# Patient Record
Sex: Female | Born: 1970 | Race: Black or African American | Hispanic: No | Marital: Single | Smoking: Current every day smoker
Health system: Southern US, Community
[De-identification: ages and names within clinical notes are randomized; demographics above are authoritative.]

## PROBLEM LIST (undated history)

## (undated) DIAGNOSIS — I509 Heart failure, unspecified: Secondary | ICD-10-CM

## (undated) DIAGNOSIS — R519 Headache, unspecified: Secondary | ICD-10-CM

## (undated) DIAGNOSIS — IMO0002 Reserved for concepts with insufficient information to code with codable children: Secondary | ICD-10-CM

## (undated) DIAGNOSIS — I4892 Unspecified atrial flutter: Secondary | ICD-10-CM

## (undated) DIAGNOSIS — D649 Anemia, unspecified: Secondary | ICD-10-CM

## (undated) DIAGNOSIS — I517 Cardiomegaly: Secondary | ICD-10-CM

## (undated) DIAGNOSIS — N179 Acute kidney failure, unspecified: Secondary | ICD-10-CM

## (undated) DIAGNOSIS — R51 Headache: Secondary | ICD-10-CM

## (undated) DIAGNOSIS — Z992 Dependence on renal dialysis: Secondary | ICD-10-CM

## (undated) DIAGNOSIS — I5030 Unspecified diastolic (congestive) heart failure: Secondary | ICD-10-CM

## (undated) DIAGNOSIS — M1990 Unspecified osteoarthritis, unspecified site: Secondary | ICD-10-CM

## (undated) DIAGNOSIS — J449 Chronic obstructive pulmonary disease, unspecified: Secondary | ICD-10-CM

## (undated) DIAGNOSIS — F329 Major depressive disorder, single episode, unspecified: Secondary | ICD-10-CM

## (undated) DIAGNOSIS — E559 Vitamin D deficiency, unspecified: Secondary | ICD-10-CM

## (undated) DIAGNOSIS — N186 End stage renal disease: Secondary | ICD-10-CM

## (undated) DIAGNOSIS — F32A Depression, unspecified: Secondary | ICD-10-CM

## (undated) DIAGNOSIS — F419 Anxiety disorder, unspecified: Secondary | ICD-10-CM

## (undated) DIAGNOSIS — K219 Gastro-esophageal reflux disease without esophagitis: Secondary | ICD-10-CM

## (undated) DIAGNOSIS — R188 Other ascites: Secondary | ICD-10-CM

## (undated) DIAGNOSIS — I361 Nonrheumatic tricuspid (valve) insufficiency: Secondary | ICD-10-CM

## (undated) DIAGNOSIS — N189 Chronic kidney disease, unspecified: Secondary | ICD-10-CM

## (undated) DIAGNOSIS — J45909 Unspecified asthma, uncomplicated: Secondary | ICD-10-CM

## (undated) DIAGNOSIS — I10 Essential (primary) hypertension: Secondary | ICD-10-CM

## (undated) DIAGNOSIS — R0600 Dyspnea, unspecified: Secondary | ICD-10-CM

## (undated) HISTORY — DX: Unspecified atrial flutter: I48.92

## (undated) HISTORY — PX: PARATHYROID IMPLANT REMOVAL: SHX5276

## (undated) HISTORY — DX: End stage renal disease: N18.6

## (undated) HISTORY — DX: Acute kidney failure, unspecified: N17.9

## (undated) HISTORY — DX: Essential (primary) hypertension: I10

## (undated) HISTORY — DX: Vitamin D deficiency, unspecified: E55.9

## (undated) HISTORY — DX: Nonrheumatic tricuspid (valve) insufficiency: I36.1

## (undated) HISTORY — DX: Unspecified diastolic (congestive) heart failure: I50.30

## (undated) HISTORY — DX: Cardiomegaly: I51.7

## (undated) HISTORY — DX: Anemia, unspecified: D64.9

## (undated) HISTORY — DX: Gastro-esophageal reflux disease without esophagitis: K21.9

## (undated) HISTORY — DX: Reserved for concepts with insufficient information to code with codable children: IMO0002

## (undated) NOTE — *Deleted (*Deleted)
Patient left unit without informing nurse. Charge, [facility] and security notified regarding patient leaving unit. Patient back in room. Will continue to monitor.

---
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
## 2005-03-24 ENCOUNTER — Emergency Department: Payer: Self-pay | Admitting: Emergency Medicine

## 2005-04-17 ENCOUNTER — Encounter: Payer: Self-pay | Admitting: Orthopedic Surgery

## 2005-04-30 ENCOUNTER — Encounter: Payer: Self-pay | Admitting: Orthopedic Surgery

## 2005-05-31 ENCOUNTER — Encounter: Payer: Self-pay | Admitting: Orthopedic Surgery

## 2006-04-16 ENCOUNTER — Ambulatory Visit: Payer: Self-pay

## 2008-04-20 ENCOUNTER — Emergency Department: Payer: Self-pay | Admitting: Emergency Medicine

## 2009-04-02 ENCOUNTER — Emergency Department: Payer: Self-pay | Admitting: Emergency Medicine

## 2009-04-03 ENCOUNTER — Emergency Department: Payer: Self-pay | Admitting: Emergency Medicine

## 2011-11-28 ENCOUNTER — Emergency Department: Payer: Self-pay

## 2012-11-11 ENCOUNTER — Ambulatory Visit: Payer: Self-pay | Admitting: Oncology

## 2012-11-20 ENCOUNTER — Ambulatory Visit: Payer: Self-pay | Admitting: Oncology

## 2012-11-20 LAB — CBC CANCER CENTER
Basophil %: 1.2 %
HCT: 43.3 % (ref 35.0–47.0)
Lymphocyte #: 2.6 x10 3/mm (ref 1.0–3.6)
Lymphocyte %: 29.3 %
MCH: 27.6 pg (ref 26.0–34.0)
MCV: 89 fL (ref 80–100)
Monocyte #: 0.9 x10 3/mm (ref 0.2–0.9)
Neutrophil #: 4.9 x10 3/mm (ref 1.4–6.5)
Platelet: 321 x10 3/mm (ref 150–440)
RBC: 4.84 10*6/uL (ref 3.80–5.20)
RDW: 15.3 % — ABNORMAL HIGH (ref 11.5–14.5)
WBC: 8.7 x10 3/mm (ref 3.6–11.0)

## 2012-11-20 LAB — COMPREHENSIVE METABOLIC PANEL
Albumin: 3.7 g/dL (ref 3.4–5.0)
BUN: 24 mg/dL — ABNORMAL HIGH (ref 7–18)
EGFR (African American): 49 — ABNORMAL LOW
Glucose: 93 mg/dL (ref 65–99)
SGOT(AST): 22 U/L (ref 15–37)
SGPT (ALT): 40 U/L (ref 12–78)
Total Protein: 8.3 g/dL — ABNORMAL HIGH (ref 6.4–8.2)

## 2012-11-30 ENCOUNTER — Ambulatory Visit: Payer: Self-pay | Admitting: Oncology

## 2012-12-31 ENCOUNTER — Ambulatory Visit: Payer: Self-pay | Admitting: Oncology

## 2012-12-31 HISTORY — PX: OTHER SURGICAL HISTORY: SHX169

## 2013-01-12 ENCOUNTER — Ambulatory Visit: Payer: Self-pay | Admitting: Emergency Medicine

## 2013-01-13 ENCOUNTER — Ambulatory Visit: Payer: Self-pay | Admitting: Emergency Medicine

## 2013-04-16 ENCOUNTER — Ambulatory Visit: Payer: Self-pay | Admitting: Emergency Medicine

## 2013-05-22 ENCOUNTER — Ambulatory Visit: Payer: Self-pay | Admitting: Oncology

## 2013-05-31 ENCOUNTER — Ambulatory Visit: Payer: Self-pay | Admitting: Oncology

## 2013-12-02 ENCOUNTER — Encounter: Payer: Self-pay | Admitting: *Deleted

## 2013-12-07 ENCOUNTER — Other Ambulatory Visit: Payer: Medicaid Other

## 2013-12-07 ENCOUNTER — Encounter: Payer: Self-pay | Admitting: General Surgery

## 2013-12-07 ENCOUNTER — Ambulatory Visit (INDEPENDENT_AMBULATORY_CARE_PROVIDER_SITE_OTHER): Payer: Medicaid Other | Admitting: General Surgery

## 2013-12-07 VITALS — BP 124/82 | HR 80 | Resp 16 | Ht 64.0 in | Wt 249.0 lb

## 2013-12-07 DIAGNOSIS — IMO0002 Reserved for concepts with insufficient information to code with codable children: Secondary | ICD-10-CM

## 2013-12-07 DIAGNOSIS — R2232 Localized swelling, mass and lump, left upper limb: Secondary | ICD-10-CM

## 2013-12-07 DIAGNOSIS — R229 Localized swelling, mass and lump, unspecified: Secondary | ICD-10-CM

## 2013-12-07 NOTE — Progress Notes (Signed)
Patient ID: Janet Olson, female   DOB: 1970-02-06, 43 y.o.   MRN: [id number]  Chief Complaint  Patient presents with  . Other    pocket under left arm needs to be drained.     HPI Janet Olson is a 43 y.o. female who presents for an evaluation of left underarm pain and swelling. The patient noticed this problem approximately 5 months ago. It started off small but has gotten larger. She states that since it has gotten larger she is having some tingling in the left arm as well as some pain. She denies any draining. Patient had excision of lymph nodes in both axilla in the early part of this year. Reportedly they are all benign. She is had a recent mammogram in 10/2013 at [facility].    HPI  Past Medical History  Diagnosis Date  . GERD (gastroesophageal reflux disease)   . Ulcer   . Anemia     Past Surgical History  Procedure Laterality Date  . Cesarean section      x 4  . Excision of lymph nodes Bilateral 2014    bilateral under arms.    No family history on file.  Social History History  Substance Use Topics  . Smoking status: Current Every Day Smoker -- 30 years  . Smokeless tobacco: Not on file  . Alcohol Use: Yes    No Known Allergies  Current Outpatient Prescriptions  Medication Sig Dispense Refill  . albuterol (PROVENTIL) (2.5 MG/3ML) 0.083% nebulizer solution Take 2.5 mg by nebulization 3 (three) times daily.      . ALBUTEROL IN Inhale 1-2 puffs into the lungs as needed.      Marland Kitchen ibuprofen (ADVIL,MOTRIN) 600 MG tablet Take 600 mg by mouth every 6 (six) hours as needed.      . Multiple Vitamin (MULTIVITAMIN) tablet Take 1 tablet by mouth daily.      . pantoprazole (PROTONIX) 40 MG tablet Take 40 mg by mouth daily.      Marland Kitchen tiotropium (SPIRIVA) 18 MCG inhalation capsule Place 18 mcg into inhaler and inhale 2 (two) times daily.      Marland Kitchen VITAMIN D, CHOLECALCIFEROL, PO Take 1 tablet by mouth daily.       No current facility-administered medications for this visit.     Review of Systems Review of Systems  Constitutional: Negative.   Respiratory: Negative.   Cardiovascular: Negative.     Blood pressure 124/82, pulse 80, resp. rate 16, height 5\' 4"  (1.626 m), weight 249 lb (112.946 kg), last menstrual period 11/30/2013.  Physical Exam Physical Exam  Constitutional: She is oriented to person, place, and time. She appears well-developed and well-nourished.  Eyes: Conjunctivae are normal. No scleral icterus.  Neck: Neck supple. No thyromegaly present.  Cardiovascular: Normal rate, regular rhythm and normal heart sounds.   No murmur heard. Pulmonary/Chest: Effort normal and breath sounds normal. Right breast exhibits no inverted nipple, no mass, no nipple discharge, no skin change and no tenderness. Left breast exhibits no inverted nipple, no mass, no nipple discharge, no skin change and no tenderness.     Lymphadenopathy:    She has no cervical adenopathy.    She has axillary adenopathy.  Soft 2 cm node in the right axilla.  1.5cm lump under the left axilla incision.      Neurological: She is alert and oriented to person, place, and time.  Skin: Skin is warm and dry.    Data Reviewed US-showed a 2.6cm seroma left axilla Assessment  Seroma left axillary incision     Plan   5 ml of clear seroma fluid drained.              Criss Alvine F 12/07/2013, 9:44 AM

## 2013-12-07 NOTE — Patient Instructions (Signed)
Patient to return as needed. 

## 2014-04-02 ENCOUNTER — Ambulatory Visit: Payer: Self-pay | Admitting: Orthopedic Surgery

## 2014-04-06 ENCOUNTER — Emergency Department: Payer: Self-pay | Admitting: Emergency Medicine

## 2014-09-23 ENCOUNTER — Emergency Department: Payer: Self-pay | Admitting: Emergency Medicine

## 2014-09-23 LAB — CBC
HCT: 36.2 % (ref 35.0–47.0)
HGB: 10.6 g/dL — ABNORMAL LOW (ref 12.0–16.0)
MCH: 23.4 pg — ABNORMAL LOW (ref 26.0–34.0)
MCHC: 29.4 g/dL — ABNORMAL LOW (ref 32.0–36.0)
MCV: 80 fL (ref 80–100)
Platelet: 342 10*3/uL (ref 150–440)
RBC: 4.54 10*6/uL (ref 3.80–5.20)
RDW: 18.3 % — AB (ref 11.5–14.5)
WBC: 7.2 10*3/uL (ref 3.6–11.0)

## 2014-09-23 LAB — COMPREHENSIVE METABOLIC PANEL
ALBUMIN: 3.2 g/dL — AB (ref 3.4–5.0)
Alkaline Phosphatase: 65 U/L
Anion Gap: 6 — ABNORMAL LOW (ref 7–16)
BUN: 18 mg/dL (ref 7–18)
Bilirubin,Total: 0.2 mg/dL (ref 0.2–1.0)
CREATININE: 1.41 mg/dL — AB (ref 0.60–1.30)
Calcium, Total: 8.9 mg/dL (ref 8.5–10.1)
Chloride: 112 mmol/L — ABNORMAL HIGH (ref 98–107)
Co2: 21 mmol/L (ref 21–32)
EGFR (African American): 52 — ABNORMAL LOW
EGFR (Non-African Amer.): 43 — ABNORMAL LOW
Glucose: 82 mg/dL (ref 65–99)
OSMOLALITY: 279 (ref 275–301)
Potassium: 4.2 mmol/L (ref 3.5–5.1)
SGOT(AST): 15 U/L (ref 15–37)
SGPT (ALT): 32 U/L
Sodium: 139 mmol/L (ref 136–145)
Total Protein: 7.5 g/dL (ref 6.4–8.2)

## 2014-11-01 ENCOUNTER — Encounter: Payer: Self-pay | Admitting: General Surgery

## 2015-04-22 NOTE — Op Note (Signed)
PATIENT NAME:  Janet Olson, VANDERKOLK MR#:  [id number] DATE OF BIRTH:  Jul 03, 1970  DATE OF PROCEDURE:  04/16/2013  PREOPERATIVE DIAGNOSIS:  Left axillary node enlargement and what looked like a large lymph node also in the right axilla.   POSTOPERATIVE DIAGNOSIS:  Left axillary node enlargement and what looked like a large lymph node also in the right axilla.  PROCEDURE:   1.  Excision of lymph node in the left axilla. 2.  Drainage of the seroma cavity as well as removal of the part of the seroma cavity.   SURGEON:  Vella Kohler, MD  INDICATION FOR PROCEDURE:  This patient keeps getting lymph nodes in both axillae. The patient had a lymph node biopsy done on the right before and it turned out to be benign lymph node.  The patient came back to me.  She said she could still feel a lump in the right side and also another lump in the left axilla. She was then brought to surgery.  DESCRIPTION OF PROCEDURE: Under general anesthesia, the left axilla was then prepped and draped. A small incision was made. After cutting skin and subcutaneous tissue, the lymph node was then exposed. It was then completely removed. Bleeding was stopped with the Bovie and vascular pedicle was closed with hemostats and stitches. After that subcuticular suturing was done with 3-0 Vicryl and 4-0 Vicryl running sutures. Steri-Strips were applied.   On the right side, I made an incision in the previous scar.  After cutting skin and subcutaneous tissue, I entered into a big cavity with fluid. It was drained and part of the cystic cavity was then completely removed all the way down. The wound was then closed. I did not feel any major lymph node in this area. The wound was then closed in layers with 3-0 Vicryl and 4-0 Vicryl running sutures. Steri-Strips were applied. The patient tolerated the procedure well and was sent to the recovery room in satisfactory condition.  ____________________________ Welford Roche Phylis Bougie, MD msh:sb D: 04/16/2013  10:19:12 ET T: 04/16/2013 11:33:20 ET JOB#: [id number]  cc: Lynna Zamorano S. Phylis Bougie, MD, <Dictator> Martie Lee. Oliva Bustard, MD Sharene Butters MD ELECTRONICALLY SIGNED 04/30/2013 14:00

## 2015-04-22 NOTE — Op Note (Signed)
PATIENT NAME:  Janet Olson, PIN MR#:  [id number] DATE OF BIRTH:  06-Jun-1970  DATE OF PROCEDURE:  01/13/2013  PREOPERATIVE DIAGNOSIS: Right axillary node enlargement.   POSTOPERATIVE DIAGNOSIS: Right axillary node enlargement.   PROCEDURE: Excision of right axillary lymph node.   INDICATION: This patient was seen by me because of enlarged axillary lymph nodes. The patient was then sent to oncology who advised her to have biopsy done. The patient was then brought to surgery.   DESCRIPTION OF PROCEDURE: Under general anesthesia, the right arm was then prepped and draped. An incision was made in the hairline. After cutting skin and subcutaneous tissue, the lymph node was palpated. It was then completely removed, and bleeding was stopped with the Bovie. I felt no other major lymph nodes there. The wound was then closed in layers with 3-0 Vicryl and 4-0 Vicryl sutures. Glue on the skin applied. The patient tolerated the procedure well and was sent to the recovery room in satisfactory condition.   ____________________________ Welford Roche Phylis Bougie, MD msh:gb D: 01/13/2013 16:30:22 ET T: 01/13/2013 21:13:11 ET JOB#: [id number]  cc: Maanya Hippert S. Phylis Bougie, MD, <Dictator> Sharene Butters MD ELECTRONICALLY SIGNED 01/25/2013 13:38

## 2015-05-01 ENCOUNTER — Emergency Department
Admission: EM | Admit: 2015-05-01 | Discharge: 2015-05-01 | Disposition: A | Discharge status: Home / Self Care | Payer: Self-pay | Attending: Internal Medicine | Admitting: Internal Medicine

## 2015-05-01 ENCOUNTER — Encounter: Payer: Self-pay | Admitting: Emergency Medicine

## 2015-05-01 ENCOUNTER — Other Ambulatory Visit: Payer: Self-pay

## 2015-05-01 ENCOUNTER — Emergency Department: Payer: Medicaid Other

## 2015-05-01 DIAGNOSIS — Z79899 Other long term (current) drug therapy: Secondary | ICD-10-CM | POA: Insufficient documentation

## 2015-05-01 DIAGNOSIS — R079 Chest pain, unspecified: Secondary | ICD-10-CM | POA: Insufficient documentation

## 2015-05-01 DIAGNOSIS — Z720 Tobacco use: Secondary | ICD-10-CM | POA: Insufficient documentation

## 2015-05-01 DIAGNOSIS — J440 Chronic obstructive pulmonary disease with acute lower respiratory infection: Secondary | ICD-10-CM | POA: Insufficient documentation

## 2015-05-01 DIAGNOSIS — J209 Acute bronchitis, unspecified: Secondary | ICD-10-CM

## 2015-05-01 HISTORY — DX: Unspecified asthma, uncomplicated: J45.909

## 2015-05-01 HISTORY — DX: Chronic obstructive pulmonary disease, unspecified: J44.9

## 2015-05-01 LAB — CBC
HEMATOCRIT: 31.7 % — AB (ref 35.0–47.0)
Hemoglobin: 9.8 g/dL — ABNORMAL LOW (ref 12.0–16.0)
MCH: 22.6 pg — ABNORMAL LOW (ref 26.0–34.0)
MCHC: 30.9 g/dL — ABNORMAL LOW (ref 32.0–36.0)
MCV: 73.2 fL — ABNORMAL LOW (ref 80.0–100.0)
PLATELETS: 381 10*3/uL (ref 150–440)
RBC: 4.33 MIL/uL (ref 3.80–5.20)
RDW: 18.5 % — ABNORMAL HIGH (ref 11.5–14.5)
WBC: 7 10*3/uL (ref 3.6–11.0)

## 2015-05-01 LAB — BASIC METABOLIC PANEL
Anion gap: 8 (ref 5–15)
BUN: 20 mg/dL (ref 6–20)
CALCIUM: 8.7 mg/dL — AB (ref 8.9–10.3)
CO2: 21 mmol/L — ABNORMAL LOW (ref 22–32)
Chloride: 110 mmol/L (ref 101–111)
Creatinine, Ser: 1.48 mg/dL — ABNORMAL HIGH (ref 0.44–1.00)
GFR calc Af Amer: 48 mL/min — ABNORMAL LOW (ref >60)
GFR calc non Af Amer: 41 mL/min — ABNORMAL LOW (ref >60)
GLUCOSE: 107 mg/dL — AB (ref 65–99)
POTASSIUM: 4.1 mmol/L (ref 3.5–5.1)
SODIUM: 139 mmol/L (ref 135–145)

## 2015-05-01 LAB — TROPONIN I: Troponin I: <0.03 ng/mL (ref <0.031)

## 2015-05-01 MED ORDER — PREDNISONE 20 MG PO TABS
60.0000 mg | ORAL_TABLET | Freq: Once | ORAL | Status: AC
  Administered 2015-05-01: 60 mg via ORAL

## 2015-05-01 MED ORDER — PREDNISONE 20 MG PO TABS
ORAL_TABLET | ORAL | Status: AC
  Filled 2015-05-01: qty 3

## 2015-05-01 MED ORDER — ALBUTEROL SULFATE (2.5 MG/3ML) 0.083% IN NEBU
INHALATION_SOLUTION | RESPIRATORY_TRACT | Status: AC
  Filled 2015-05-01: qty 3

## 2015-05-01 MED ORDER — HYDROCODONE-HOMATROPINE 5-1.5 MG/5ML PO SYRP: 5.0000 mL | ORAL_SOLUTION | Freq: Four times a day (QID) | ORAL | Status: DC | PRN

## 2015-05-01 MED ORDER — AZITHROMYCIN 250 MG PO TABS: ORAL_TABLET | ORAL | Status: DC

## 2015-05-01 MED ORDER — BENZONATATE 100 MG PO CAPS: 100.0000 mg | ORAL_CAPSULE | Freq: Three times a day (TID) | ORAL | Status: DC | PRN

## 2015-05-01 MED ORDER — PREDNISONE 50 MG PO TABS: 50.0000 mg | ORAL_TABLET | Freq: Every day | ORAL | Status: AC

## 2015-05-01 MED ORDER — ALBUTEROL SULFATE (2.5 MG/3ML) 0.083% IN NEBU
5.0000 mg | INHALATION_SOLUTION | Freq: Once | RESPIRATORY_TRACT | Status: AC
  Administered 2015-05-01: 5 mg via RESPIRATORY_TRACT

## 2015-05-01 NOTE — ED Notes (Signed)
Pt states she had had cough and  Congestion x 1 week, hx of COPD, states she also is having some intermittent left sided cp as well as sob, also having right flank pain, states flank pain is constant and worse with coughing

## 2015-05-01 NOTE — ED Notes (Signed)
sing nature pad note available

## 2015-05-01 NOTE — ED Provider Notes (Signed)
[facility] Emergency Department Provider Note  {** ____________________________________________  Time seen: 4 PM  I have reviewed the triage vital signs and the nursing notes.   HISTORY  Chief Complaint Chest Pain; Cough; and Shortness of Breath       HPI Janet Olson is a 45 y.o. female chief complaint of cough congestion and shortness of breath for 7 days. She has been using her nebulizer at home with minimal relief. Her cough has been keeping her up at night. She has yellow sputum. She feels tightness in her chest and has been having audible wheezing with breathing. She does not have fever. She still smokes one pack per day. She reports that past medical history of bronchitis 2 times a year. She is having right-sided rib pain worse with coughing. She describes the pain as sharp worse with deep breath and cough. It is located in the left lateral rib area. It started 5 days ago 2 days after she started to cough. It is intermittent in nature it is moderate in severity 5 out of 10.Deep breathing makes the pain in the right rib area worse.     Past Medical History  Diagnosis Date  . GERD (gastroesophageal reflux disease)   . Ulcer   . Anemia   . COPD (chronic obstructive pulmonary disease)   . Asthma     Patient Active Problem List   Diagnosis Date Noted  . Mass of left axilla 12/07/2013    Past Surgical History  Procedure Laterality Date  . Cesarean section      x 4  . Excision of lymph nodes Bilateral 2014    bilateral under arms.    Current Outpatient Rx  Name  Route  Sig  Dispense  Refill  . albuterol (PROVENTIL) (2.5 MG/3ML) 0.083% nebulizer solution   Nebulization   Take 2.5 mg by nebulization 3 (three) times daily.         . ALBUTEROL IN   Inhalation   Inhale 1-2 puffs into the lungs as needed.         Marland Kitchen ibuprofen (ADVIL,MOTRIN) 600 MG tablet   Oral   Take 600 mg by mouth every 6 (six) hours as needed.         .  Multiple Vitamin (MULTIVITAMIN) tablet   Oral   Take 1 tablet by mouth daily.         . pantoprazole (PROTONIX) 40 MG tablet   Oral   Take 40 mg by mouth daily.         Marland Kitchen tiotropium (SPIRIVA) 18 MCG inhalation capsule   Inhalation   Place 18 mcg into inhaler and inhale 2 (two) times daily.         Marland Kitchen VITAMIN D, CHOLECALCIFEROL, PO   Oral   Take 1 tablet by mouth daily.           Allergies Review of patient's allergies indicates no known allergies.  No family history on file.  Social History History  Substance Use Topics  . Smoking status: Current Every Day Smoker -- 30 years    Types: Cigarettes  . Smokeless tobacco: Not on file  . Alcohol Use: Yes    Review of Systems  Constitutional: Negative for fever. Eyes: Negative for visual changes. ENT: Negative for sore throat. Cardiovascular: Positive for right sided lateral chest and rib pain worse with deep breath and coughing.Marland Kitchen Respiratory: Positive shortness of breath and wheezing Gastrointestinal: Negative for abdominal pain, vomiting and diarrhea. Genitourinary: Negative  for dysuria. Musculoskeletal: Negative for back pain. Skin: Negative for rash. Neurological: Negative for headaches, focal weakness or numbness.   10-point ROS otherwise negative.  ____________________________________________   PHYSICAL EXAM:  VITAL SIGNS: ED Triage Vitals  Enc Vitals Group     BP 05/01/15 1319 142/100 mmHg     Pulse Rate 05/01/15 1319 98     Resp 05/01/15 1319 18     Temp 05/01/15 1319 98.9 F (37.2 C)     Temp Source 05/01/15 1319 Oral     SpO2 05/01/15 1319 100 %     Weight 05/01/15 1319 200 lb (90.719 kg)     Height 05/01/15 1319 5\' 6"  (1.676 m)     Head Cir --      Peak Flow --      Pain Score --      Pain Loc --      Pain Edu? --      Excl. in [place]? --     Vital signs are reviewed and the blood pressure is slightly elevated at 142/100. She is having mild respiratory distress. Obese,  afebrile Constitutional: Alert and oriented. Well appearing . Eyes: Conjunctivae are normal. PERRL. Normal extraocular movements. ENT   Head: Normocephalic and atraumatic.   Nose: No congestion/rhinnorhea.   Mouth/Throat: Mucous membranes are moist.   Neck: No stridor. Hematological/Lymphatic/Immunilogical: No cervical lymphadenopathy. Cardiovascular: Normal rate, regular rhythm. Normal and symmetric distal pulses are present in all extremities. No murmurs, rubs, or gallops. Respiratory: Increased respiratory effort with a respiratory rate of 18 diffuse wheezing bilaterally in all lung fields no rales or rhonchi Gastrointestinal: Soft and nontender. No distention. No abdominal bruits. There is no CVA tenderness. Genitourinary: Deferred Musculoskeletal: Nontender with normal range of motion in all extremities. No joint effusions.  No lower extremity tenderness nor edema. Neurologic:  Normal speech and language. No gross focal neurologic deficits are appreciated. Speech is normal. No gait instability. Skin:  Skin is warm, dry and intact. No rash noted. Psychiatric: Mood and affect are normal. Speech and behavior are normal. Patient exhibits appropriate insight and judgment.  ____________________________________________   EKG  ED ECG REPORT   Date: 05/01/2015  EKG Time: 1327  Rate: 91  Rhythm: nsr  Axis: Normal access  Intervals:none  ST&T Change: Nonspecific ST-T wave changes   ____________________________________________    RADIOLOGY  Chest x-ray is negative for any pathology.  ____________________________________________   PROCEDURES  Procedure(s) performed:   Critical Care performed:   ____________________________________________   INITIAL IMPRESSION / ASSESSMENT AND PLAN / ED COURSE  Pertinent labs & imaging results that were available during my care of the patient were reviewed by me and considered in my medical decision making (see chart for  details).  This is a 45 year old obese black female with a past medical history of COPD who still smokes. She presents to the ED with shortness of breath and wheezing and cough of 7 days duration. She is oxygenating well she is able to speak during the evaluation she will receive nebulized bronchodilators in the emergency department steroids and I suspect that she will be stable for discharge on nebulized bronchodilators and steroids and antibiotics. She will get a chest x-ray in the emergency department.  Chest x-ray is negative for infection. After nebulizers and steroids in the emergency department she is stable for discharge and treatment at home. She will be treated with prednisone 60 mg tapered dose continue nebulizer bronchodilator treatment Zithromax and cough suppressant. She will follow-up with her primary  care doctor. She has been instructed to stop smoking. ____________________________________________   FINAL CLINICAL IMPRESSION(S) / ED DIAGNOSES  Final diagnoses:  None   #1 bronchitis acute #2 COPD  #3 smoking    Boris Lown, DO 05/01/15 1814

## 2015-11-26 ENCOUNTER — Emergency Department: Payer: Self-pay

## 2015-11-26 ENCOUNTER — Encounter: Payer: Self-pay | Admitting: Emergency Medicine

## 2015-11-26 ENCOUNTER — Emergency Department
Admission: EM | Admit: 2015-11-26 | Discharge: 2015-11-26 | Disposition: A | Discharge status: Home / Self Care | Payer: Self-pay | Attending: Emergency Medicine | Admitting: Emergency Medicine

## 2015-11-26 DIAGNOSIS — F17210 Nicotine dependence, cigarettes, uncomplicated: Secondary | ICD-10-CM | POA: Insufficient documentation

## 2015-11-26 DIAGNOSIS — J441 Chronic obstructive pulmonary disease with (acute) exacerbation: Secondary | ICD-10-CM | POA: Insufficient documentation

## 2015-11-26 DIAGNOSIS — Z79899 Other long term (current) drug therapy: Secondary | ICD-10-CM | POA: Insufficient documentation

## 2015-11-26 DIAGNOSIS — J209 Acute bronchitis, unspecified: Secondary | ICD-10-CM

## 2015-11-26 MED ORDER — FLUTICASONE PROPIONATE 50 MCG/ACT NA SUSP: 1.0000 | Freq: Every day | NASAL | Status: DC

## 2015-11-26 MED ORDER — PREDNISONE 10 MG (21) PO TBPK: 10.0000 mg | ORAL_TABLET | ORAL | Status: DC

## 2015-11-26 MED ORDER — BENZONATATE 100 MG PO CAPS: 100.0000 mg | ORAL_CAPSULE | Freq: Three times a day (TID) | ORAL | Status: DC | PRN

## 2015-11-26 MED ORDER — GUAIFENESIN-CODEINE 100-10 MG/5ML PO SOLN: 5.0000 mL | Freq: Three times a day (TID) | ORAL | Status: DC | PRN

## 2015-11-26 NOTE — ED Notes (Signed)
Productive yellow

## 2015-11-26 NOTE — ED Provider Notes (Signed)
[facility] Emergency Department Provider Note ____________________________________________  Time seen: 1420  I have reviewed the triage vital signs and the nursing notes.  HISTORY  Chief Complaint  Cough  HPI Janet Olson is a 45 y.o. female ports to the ED for evaluation of productive cough. She is a patient with a knownhistory of COPD, chronic bronchitis, and asthma. She is also a one half packs per day smoker. She reports over the last 2 weeks she's had increasingly productive cough, and wheezing, despite her daily nebulizer use. She reports symptoms are typically worse at least once during the winter months. She denies any intermittent fevers, but has noted some sporadic chills, and night sweats. She rates her discomfort an 8/10 in triage.  Past Medical History  Diagnosis Date  . GERD (gastroesophageal reflux disease)   . Ulcer   . Anemia   . COPD (chronic obstructive pulmonary disease) ([place])   . Asthma     Patient Active Problem List   Diagnosis Date Noted  . Mass of left axilla 12/07/2013    Past Surgical History  Procedure Laterality Date  . Cesarean section      x 4  . Excision of lymph nodes Bilateral 2014    bilateral under arms.    Current Outpatient Rx  Name  Route  Sig  Dispense  Refill  . albuterol (PROVENTIL) (2.5 MG/3ML) 0.083% nebulizer solution   Nebulization   Take 2.5 mg by nebulization 3 (three) times daily.         . ALBUTEROL IN   Inhalation   Inhale 1-2 puffs into the lungs as needed.         Marland Kitchen azithromycin (ZITHROMAX) 250 MG tablet      Take 2 tabs PO x 1 dose, then 1 tab PO QD x 4 days   6 tablet   0   . benzonatate (TESSALON PERLES) 100 MG capsule   Oral   Take 1 capsule (100 mg total) by mouth 3 (three) times daily as needed for cough (Take 1-2 per dose).   30 capsule   0   . fluticasone (FLONASE) 50 MCG/ACT nasal spray   Each Nare   Place 1 spray into both nostrils daily.   16 g   0   .  guaiFENesin-codeine 100-10 MG/5ML syrup   Oral   Take 5 mLs by mouth 3 (three) times daily as needed for cough.   120 mL   0   . HYDROcodone-homatropine (HYCODAN) 5-1.5 MG/5ML syrup   Oral   Take 5 mLs by mouth every 6 (six) hours as needed for cough.   120 mL   0   . ibuprofen (ADVIL,MOTRIN) 600 MG tablet   Oral   Take 600 mg by mouth every 6 (six) hours as needed.         . Multiple Vitamin (MULTIVITAMIN) tablet   Oral   Take 1 tablet by mouth daily.         . pantoprazole (PROTONIX) 40 MG tablet   Oral   Take 40 mg by mouth daily.         . predniSONE (STERAPRED UNI-PAK 21 TAB) 10 MG (21) TBPK tablet   Oral   Take 1 tablet (10 mg total) by mouth as directed. Dose pack as directed   21 tablet   0   . tiotropium (SPIRIVA) 18 MCG inhalation capsule   Inhalation   Place 18 mcg into inhaler and inhale 2 (two) times daily.         Marland Kitchen  VITAMIN D, CHOLECALCIFEROL, PO   Oral   Take 1 tablet by mouth daily.          Allergies Review of patient's allergies indicates no known allergies.  No family history on file.  Social History Social History  Substance Use Topics  . Smoking status: Current Every Day Smoker -- 0.50 packs/day for 30 years    Types: Cigarettes  . Smokeless tobacco: None  . Alcohol Use: Yes   Review of Systems  Constitutional: Negative for fever. Eyes: Negative for visual changes. ENT: Negative for sore throat. Cardiovascular: Negative for chest pain. Respiratory: Negative for shortness of breath. Reports cough as above.  Gastrointestinal: Negative for abdominal pain, vomiting and diarrhea. Genitourinary: Negative for dysuria. Musculoskeletal: Negative for back pain. Skin: Negative for rash. Neurological: Negative for headaches, focal weakness or numbness. ____________________________________________  PHYSICAL EXAM:  VITAL SIGNS: ED Triage Vitals  Enc Vitals Group     BP 11/26/15 1309 144/85 mmHg     Pulse Rate 11/26/15 1309 90      Resp 11/26/15 1309 20     Temp 11/26/15 1309 98.3 F (36.8 C)     Temp Source 11/26/15 1309 Oral     SpO2 11/26/15 1309 100 %     Weight 11/26/15 1309 195 lb (88.451 kg)     Height 11/26/15 1309 5\' 2"  (1.575 m)     Head Cir --      Peak Flow --      Pain Score 11/26/15 1310 8     Pain Loc --      Pain Edu? --      Excl. in [place]? --    Constitutional: Alert and oriented. Well appearing and in no distress. Head: Normocephalic and atraumatic.      Eyes: Conjunctivae are normal. PERRL. Normal extraocular movements      Ears: Canals clear. TMs intact bilaterally.   Nose: No congestion/rhinorrhea.   Mouth/Throat: Mucous membranes are moist.   Neck: Supple. No thyromegaly. Hematological/Lymphatic/Immunological: No cervical lymphadenopathy. Cardiovascular: Normal rate, regular rhythm.  Respiratory: Normal respiratory effort. Bilateral generalized wheezes and rhonchi noted.  Gastrointestinal: Soft and nontender. No distention. Musculoskeletal: Nontender with normal range of motion in all extremities.  Neurologic:  Normal gait without ataxia. Normal speech and language. No gross focal neurologic deficits are appreciated. Skin:  Skin is warm, dry and intact. No rash noted. Psychiatric: Mood and affect are normal. Patient exhibits appropriate insight and judgment. ____________________________________________   RADIOLOGY CXR IMPRESSION: No active cardiopulmonary disease.  I, Cadyn Rodger, Dannielle Karvonen, personally viewed and evaluated these images (plain radiographs) as part of my medical decision making.  ____________________________________________  INITIAL IMPRESSION / ASSESSMENT AND PLAN / ED COURSE  Patient with clinical presentation consistent with acute asthmatic bronchitis. She'll be discharged with prescriptions for Tessalon Perles, Flonase, Robitussin AC, prednisone, and Zithromax. She will follow with her primary care provider for routine follow-up. She is encouraged to  return to the ED for acutely worsening symptoms. ____________________________________________  FINAL CLINICAL IMPRESSION(S) / ED DIAGNOSES  Final diagnoses:  Acute bronchitis, unspecified organism      Melvenia Needles, PA-C 11/26/15 [place], MD 11/26/15 [phone]

## 2015-11-26 NOTE — Discharge Instructions (Signed)
Acute Bronchitis Bronchitis is inflammation of the airways that extend from the windpipe into the lungs (bronchi). The inflammation often causes mucus to develop. This leads to a cough, which is the most common symptom of bronchitis.  In acute bronchitis, the condition usually develops suddenly and goes away over time, usually in a couple weeks. Smoking, allergies, and asthma can make bronchitis worse. Repeated episodes of bronchitis may cause further lung problems.  CAUSES Acute bronchitis is most often caused by the same virus that causes a cold. The virus can spread from person to person (contagious) through coughing, sneezing, and touching contaminated objects. SIGNS AND SYMPTOMS   Cough.   Fever.   Coughing up mucus.   Body aches.   Chest congestion.   Chills.   Shortness of breath.   Sore throat.  DIAGNOSIS  Acute bronchitis is usually diagnosed through a physical exam. Your health care provider will also ask you questions about your medical history. Tests, such as chest X-rays, are sometimes done to rule out other conditions.  TREATMENT  Acute bronchitis usually goes away in a couple weeks. Oftentimes, no medical treatment is necessary. Medicines are sometimes given for relief of fever or cough. Antibiotic medicines are usually not needed but may be prescribed in certain situations. In some cases, an inhaler may be recommended to help reduce shortness of breath and control the cough. A cool mist vaporizer may also be used to help thin bronchial secretions and make it easier to clear the chest.  HOME CARE INSTRUCTIONS  Get plenty of rest.   Drink enough fluids to keep your urine clear or pale yellow (unless you have a medical condition that requires fluid restriction). Increasing fluids may help thin your respiratory secretions (sputum) and reduce chest congestion, and it will prevent dehydration.   Take medicines only as directed by your health care provider.  If  you were prescribed an antibiotic medicine, finish it all even if you start to feel better.  Avoid smoking and secondhand smoke. Exposure to cigarette smoke or irritating chemicals will make bronchitis worse. If you are a smoker, consider using nicotine gum or skin patches to help control withdrawal symptoms. Quitting smoking will help your lungs heal faster.   Reduce the chances of another bout of acute bronchitis by washing your hands frequently, avoiding people with cold symptoms, and trying not to touch your hands to your mouth, nose, or eyes.   Keep all follow-up visits as directed by your health care provider.  SEEK MEDICAL CARE IF: Your symptoms do not improve after 1 week of treatment.  SEEK IMMEDIATE MEDICAL CARE IF:  You develop an increased fever or chills.   You have chest pain.   You have severe shortness of breath.  You have bloody sputum.   You develop dehydration.  You faint or repeatedly feel like you are going to pass out.  You develop repeated vomiting.  You develop a severe headache. MAKE SURE YOU:   Understand these instructions.  Will watch your condition.  Will get help right away if you are not doing well or get worse.   This information is not intended to replace advice given to you by your health care provider. Make sure you discuss any questions you have with your health care provider.   Document Released: 01/24/2005 Document Revised: 01/07/2015 Document Reviewed: 06/09/2013 Elsevier Interactive Patient Education 2016 [place] the prescription meds as directed. Follow-up with your provider as needed.

## 2016-11-10 ENCOUNTER — Emergency Department
Admission: EM | Admit: 2016-11-10 | Discharge: 2016-11-10 | Disposition: A | Discharge status: Home / Self Care | Payer: Self-pay | Attending: Emergency Medicine | Admitting: Emergency Medicine

## 2016-11-10 ENCOUNTER — Emergency Department: Payer: Self-pay

## 2016-11-10 ENCOUNTER — Encounter: Payer: Self-pay | Admitting: Emergency Medicine

## 2016-11-10 DIAGNOSIS — J449 Chronic obstructive pulmonary disease, unspecified: Secondary | ICD-10-CM | POA: Insufficient documentation

## 2016-11-10 DIAGNOSIS — F17210 Nicotine dependence, cigarettes, uncomplicated: Secondary | ICD-10-CM | POA: Insufficient documentation

## 2016-11-10 DIAGNOSIS — R1013 Epigastric pain: Secondary | ICD-10-CM | POA: Insufficient documentation

## 2016-11-10 DIAGNOSIS — J45909 Unspecified asthma, uncomplicated: Secondary | ICD-10-CM | POA: Insufficient documentation

## 2016-11-10 DIAGNOSIS — Z79899 Other long term (current) drug therapy: Secondary | ICD-10-CM | POA: Insufficient documentation

## 2016-11-10 LAB — CBC
HCT: 32 % — ABNORMAL LOW (ref 35.0–47.0)
Hemoglobin: 9.9 g/dL — ABNORMAL LOW (ref 12.0–16.0)
MCH: 24.2 pg — ABNORMAL LOW (ref 26.0–34.0)
MCHC: 31 g/dL — AB (ref 32.0–36.0)
MCV: 78.2 fL — AB (ref 80.0–100.0)
PLATELETS: 393 10*3/uL (ref 150–440)
RBC: 4.1 MIL/uL (ref 3.80–5.20)
RDW: 20.7 % — ABNORMAL HIGH (ref 11.5–14.5)
WBC: 8.9 10*3/uL (ref 3.6–11.0)

## 2016-11-10 LAB — COMPREHENSIVE METABOLIC PANEL
ALK PHOS: 77 U/L (ref 38–126)
ALT: 37 U/L (ref 14–54)
AST: 27 U/L (ref 15–41)
Albumin: 3.2 g/dL — ABNORMAL LOW (ref 3.5–5.0)
Anion gap: 6 (ref 5–15)
BUN: 31 mg/dL — AB (ref 6–20)
CALCIUM: 8.7 mg/dL — AB (ref 8.9–10.3)
CO2: 18 mmol/L — AB (ref 22–32)
CREATININE: 1.86 mg/dL — AB (ref 0.44–1.00)
Chloride: 113 mmol/L — ABNORMAL HIGH (ref 101–111)
GFR calc non Af Amer: 31 mL/min — ABNORMAL LOW (ref >60)
GFR, EST AFRICAN AMERICAN: 36 mL/min — AB (ref >60)
Glucose, Bld: 102 mg/dL — ABNORMAL HIGH (ref 65–99)
Potassium: 4.1 mmol/L (ref 3.5–5.1)
Sodium: 137 mmol/L (ref 135–145)
Total Bilirubin: 0.1 mg/dL — ABNORMAL LOW (ref 0.3–1.2)
Total Protein: 7.3 g/dL (ref 6.5–8.1)

## 2016-11-10 LAB — LIPASE, BLOOD: Lipase: 69 U/L — ABNORMAL HIGH (ref 11–51)

## 2016-11-10 LAB — TROPONIN I: Troponin I: <0.03 ng/mL (ref <0.03)

## 2016-11-10 MED ORDER — PANTOPRAZOLE SODIUM 40 MG PO TBEC: 40.0000 mg | DELAYED_RELEASE_TABLET | Freq: Every day | ORAL | 1 refills | Status: DC

## 2016-11-10 MED ORDER — SUCRALFATE 1 G PO TABS: 1.0000 g | ORAL_TABLET | Freq: Four times a day (QID) | ORAL | 1 refills | Status: DC

## 2016-11-10 MED ORDER — FERROUS SULFATE DRIED ER 160 (50 FE) MG PO TBCR: 160.0000 mg | EXTENDED_RELEASE_TABLET | Freq: Every day | ORAL | 6 refills | Status: DC

## 2016-11-10 MED ORDER — SODIUM CHLORIDE 0.9 % IV SOLN
Freq: Once | INTRAVENOUS | Status: AC
  Administered 2016-11-10: 1000 mL via INTRAVENOUS

## 2016-11-10 MED ORDER — MORPHINE SULFATE (PF) 4 MG/ML IV SOLN
4.0000 mg | Freq: Once | INTRAVENOUS | Status: AC
  Administered 2016-11-10: 4 mg via INTRAVENOUS
  Filled 2016-11-10: qty 1

## 2016-11-10 NOTE — ED Provider Notes (Addendum)
[facility] Emergency Department Provider Note        Time seen: ----------------------------------------- 4:27 PM on 11/10/2016 -----------------------------------------    I have reviewed the triage vital signs and the nursing notes.   HISTORY  Chief Complaint Abdominal Pain    HPI Janet Olson is a 46 y.o. female who presents to the ER for weakness and upper abdominal pain for the last 1 or 2 weeks. Nothing makes her pain better or worse. Patient states she takes a lot of Motrin for pain, she is concerned this may have affected her kidneys. She denies fevers, chills, vomiting or diarrhea.   Past Medical History:  Diagnosis Date  . Anemia   . Asthma   . COPD (chronic obstructive pulmonary disease) ([place])   . GERD (gastroesophageal reflux disease)   . Ulcer [facility])     Patient Active Problem List   Diagnosis Date Noted  . Mass of left axilla 12/07/2013    Past Surgical History:  Procedure Laterality Date  . CESAREAN SECTION     x 4  . excision of lymph nodes Bilateral 2014   bilateral under arms.    Allergies Patient has no known allergies.  Social History Social History  Substance Use Topics  . Smoking status: Current Every Day Smoker    Packs/day: 0.50    Years: 30.00    Types: Cigarettes  . Smokeless tobacco: Not on file  . Alcohol use Yes    Review of Systems Constitutional: Negative for fever. Cardiovascular: Negative for chest pain. Respiratory: Negative for shortness of breath. Gastrointestinal: Positive for abdominal pain Genitourinary: Negative for dysuria. Musculoskeletal: Negative for back pain. Skin: Negative for rash. Neurological: Negative for headaches, positive for weakness  10-point ROS otherwise negative.  ____________________________________________   PHYSICAL EXAM:  VITAL SIGNS: ED Triage Vitals  Enc Vitals Group     BP 11/10/16 1242 129/73     Pulse Rate 11/10/16 1242 (!) 101     Resp  11/10/16 1242 18     Temp 11/10/16 1242 98.5 F (36.9 C)     Temp Source 11/10/16 1242 Oral     SpO2 11/10/16 1242 99 %     Weight 11/10/16 1243 170 lb (77.1 kg)     Height 11/10/16 1243 5\' 5"  (1.651 m)     Head Circumference --      Peak Flow --      Pain Score 11/10/16 1243 6     Pain Loc --      Pain Edu? --      Excl. in [place]? --     Constitutional: Alert and oriented. Well appearing and in no distress. Eyes: Conjunctivae are normal. PERRL. Normal extraocular movements. ENT   Head: Normocephalic and atraumatic.   Nose: No congestion/rhinnorhea.   Mouth/Throat: Mucous membranes are moist.   Neck: No stridor. Cardiovascular: Normal rate, regular rhythm. No murmurs, rubs, or gallops. Respiratory: Normal respiratory effort without tachypnea nor retractions. Breath sounds are clear and equal bilaterally. No wheezes/rales/rhonchi. Gastrointestinal: Soft and nontender. Normal bowel sounds Musculoskeletal: Nontender with normal range of motion in all extremities. No lower extremity tenderness nor edema. Neurologic:  Normal speech and language. No gross focal neurologic deficits are appreciated.  Skin:  Skin is warm, dry and intact. No rash noted. Psychiatric: Mood and affect are normal. Speech and behavior are normal.  ____________________________________________  EKG: Interpreted by me. Sinus rhythm with a rate of 94 bpm, normal PR interval, normal QRS, long QT, nonspecific T-wave  changes  ____________________________________________  ED COURSE:  Pertinent labs & imaging results that were available during my care of the patient were reviewed by me and considered in my medical decision making (see chart for details). Clinical Course   Patient presents to the ER in no distress. We will assess with labs and imaging if needed.  Procedures ____________________________________________   LABS (pertinent positives/negatives)  Labs Reviewed  LIPASE, BLOOD - Abnormal;  Notable for the following:       Result Value   Lipase 69 (*)    All other components within normal limits  COMPREHENSIVE METABOLIC PANEL - Abnormal; Notable for the following:    Chloride 113 (*)    CO2 18 (*)    Glucose, Bld 102 (*)    BUN 31 (*)    Creatinine, Ser 1.86 (*)    Calcium 8.7 (*)    Albumin 3.2 (*)    Total Bilirubin 0.1 (*)    GFR calc non Af Amer 31 (*)    GFR calc Af Amer 36 (*)    All other components within normal limits  CBC - Abnormal; Notable for the following:    Hemoglobin 9.9 (*)    HCT 32.0 (*)    MCV 78.2 (*)    MCH 24.2 (*)    MCHC 31.0 (*)    RDW 20.7 (*)    All other components within normal limits  TROPONIN I    RADIOLOGY Images were viewed by me  Right upper quadrant ultrasound IMPRESSION: 1. No sonographic findings to explain the patient's history of pain. 2. Right kidney appears echogenic, raising the question of renal disease. ____________________________________________  FINAL ASSESSMENT AND PLAN  Abdominal pain  Plan: Patient with labs and imaging as dictated above. Patient's no distress, mild pancreatitis possibly related to alcohol intake. I will advise strict diet precautions, she'll be placed on ant acids and referred to GI for follow-up.   Earleen Newport, MD   Note: This dictation was prepared with Dragon dictation. Any transcriptional errors that result from this process are unintentional    Earleen Newport, MD 11/10/16 1744    Earleen Newport, MD 11/10/16 [phone]

## 2016-11-10 NOTE — ED Notes (Signed)
Patient transported to Ultrasound 

## 2016-11-10 NOTE — ED Triage Notes (Signed)
Weakness and upper abd pain x 1 1/2 weeks.

## 2017-03-21 ENCOUNTER — Emergency Department
Admission: EM | Admit: 2017-03-21 | Discharge: 2017-03-21 | Disposition: A | Discharge status: Home / Self Care | Payer: Medicaid Other | Attending: Emergency Medicine | Admitting: Emergency Medicine

## 2017-03-21 ENCOUNTER — Emergency Department: Payer: Medicaid Other

## 2017-03-21 DIAGNOSIS — J45909 Unspecified asthma, uncomplicated: Secondary | ICD-10-CM | POA: Diagnosis not present

## 2017-03-21 DIAGNOSIS — Z79899 Other long term (current) drug therapy: Secondary | ICD-10-CM | POA: Insufficient documentation

## 2017-03-21 DIAGNOSIS — J449 Chronic obstructive pulmonary disease, unspecified: Secondary | ICD-10-CM | POA: Diagnosis not present

## 2017-03-21 DIAGNOSIS — F17210 Nicotine dependence, cigarettes, uncomplicated: Secondary | ICD-10-CM | POA: Diagnosis not present

## 2017-03-21 DIAGNOSIS — R1031 Right lower quadrant pain: Secondary | ICD-10-CM | POA: Diagnosis present

## 2017-03-21 LAB — LIPASE, BLOOD: Lipase: 49 U/L (ref 11–51)

## 2017-03-21 LAB — COMPREHENSIVE METABOLIC PANEL
ALBUMIN: 2.8 g/dL — AB (ref 3.5–5.0)
ALK PHOS: 81 U/L (ref 38–126)
ALT: 17 U/L (ref 14–54)
AST: 16 U/L (ref 15–41)
Anion gap: 4 — ABNORMAL LOW (ref 5–15)
BILIRUBIN TOTAL: 0.2 mg/dL — AB (ref 0.3–1.2)
BUN: 32 mg/dL — AB (ref 6–20)
CALCIUM: 8.6 mg/dL — AB (ref 8.9–10.3)
CO2: 21 mmol/L — ABNORMAL LOW (ref 22–32)
Chloride: 112 mmol/L — ABNORMAL HIGH (ref 101–111)
Creatinine, Ser: 2.74 mg/dL — ABNORMAL HIGH (ref 0.44–1.00)
GFR calc Af Amer: 22 mL/min — ABNORMAL LOW (ref >60)
GFR calc non Af Amer: 19 mL/min — ABNORMAL LOW (ref >60)
GLUCOSE: 130 mg/dL — AB (ref 65–99)
Potassium: 4.3 mmol/L (ref 3.5–5.1)
Sodium: 137 mmol/L (ref 135–145)
TOTAL PROTEIN: 6.8 g/dL (ref 6.5–8.1)

## 2017-03-21 LAB — URINALYSIS, COMPLETE (UACMP) WITH MICROSCOPIC
BACTERIA UA: NONE SEEN
BILIRUBIN URINE: NEGATIVE
Glucose, UA: 50 mg/dL — AB
Hgb urine dipstick: NEGATIVE
Ketones, ur: NEGATIVE mg/dL
Leukocytes, UA: NEGATIVE
NITRITE: NEGATIVE
PH: 6 (ref 5.0–8.0)
Protein, ur: >=300 mg/dL — AB
SPECIFIC GRAVITY, URINE: 1.016 (ref 1.005–1.030)

## 2017-03-21 LAB — CBC
HEMATOCRIT: 27.8 % — AB (ref 35.0–47.0)
Hemoglobin: 8.8 g/dL — ABNORMAL LOW (ref 12.0–16.0)
MCH: 28.8 pg (ref 26.0–34.0)
MCHC: 31.7 g/dL — AB (ref 32.0–36.0)
MCV: 90.6 fL (ref 80.0–100.0)
Platelets: 431 10*3/uL (ref 150–440)
RBC: 3.07 MIL/uL — ABNORMAL LOW (ref 3.80–5.20)
RDW: 17.7 % — ABNORMAL HIGH (ref 11.5–14.5)
WBC: 7.8 10*3/uL (ref 3.6–11.0)

## 2017-03-21 LAB — POCT PREGNANCY, URINE: PREG TEST UR: NEGATIVE

## 2017-03-21 MED ORDER — SODIUM CHLORIDE 0.9 % IV BOLUS (SEPSIS)
1000.0000 mL | Freq: Once | INTRAVENOUS | Status: AC
Start: 2017-03-21 — End: 2017-03-21
  Administered 2017-03-21: 1000 mL via INTRAVENOUS

## 2017-03-21 MED ORDER — MORPHINE SULFATE (PF) 4 MG/ML IV SOLN
4.0000 mg | Freq: Once | INTRAVENOUS | Status: AC
Start: 2017-03-21 — End: 2017-03-21
  Administered 2017-03-21: 4 mg via INTRAVENOUS
  Filled 2017-03-21: qty 1

## 2017-03-21 MED ORDER — ONDANSETRON HCL 4 MG/2ML IJ SOLN
4.0000 mg | Freq: Once | INTRAMUSCULAR | Status: AC
  Administered 2017-03-21: 4 mg via INTRAVENOUS
  Filled 2017-03-21: qty 2

## 2017-03-21 MED ORDER — IOPAMIDOL (ISOVUE-300) INJECTION 61%
15.0000 mL | INTRAVENOUS | Status: AC
  Administered 2017-03-21 (×2): 15 mL via ORAL

## 2017-03-21 MED ORDER — TRAMADOL HCL 50 MG PO TABS: 50.0000 mg | ORAL_TABLET | Freq: Two times a day (BID) | ORAL | 0 refills | Status: DC | PRN

## 2017-03-21 NOTE — ED Notes (Signed)
Drinking CT contrast. NAD. Gave phone to pt to make call.

## 2017-03-21 NOTE — Discharge Instructions (Signed)
As we discussed here workup today shows largely normal results besides a low blood level and some renal insufficiency. Please continue to take your iron supplements as prescribed by your doctor. Please drink plenty of water over the next several days and return to your doctor on Monday or Tuesday for recheck of your labs including kidney function. Return to the emergency department for any worsening pain, fever, decreased urination, or any other symptom personally concerning to yourself.

## 2017-03-21 NOTE — ED Provider Notes (Signed)
[facility] Emergency Department Provider Note  Time seen: 11:43 AM  I have reviewed the triage vital signs and the nursing notes.   HISTORY  Chief Complaint Abdominal Pain    HPI Janet Olson is a 47 y.o. female with a past medical history of anemia, asthma, COPD, gastric reflux, presents to the emergency department with right lower quadrant abdominal pain. According to the patient for the past 4 days she has been expressing right lower quadrant abdominal pain. Patient states she was seen by her OB/GYN 4 days ago had a pelvic exam and breast exam performed. She states since that time she has been experiencing somewhat increasing right lower quadrant abdominal pain. Denies any fever, nausea, vomiting, diarrhea, dysuria, hematuria. Denies any vaginal bleeding or discharge. Patient states her last period was approximately one month ago but her periods have been very irregular over the past 1 year.  Past Medical History:  Diagnosis Date  . Anemia   . Asthma   . COPD (chronic obstructive pulmonary disease) ([place])   . GERD (gastroesophageal reflux disease)   . Ulcer [facility])     Patient Active Problem List   Diagnosis Date Noted  . Mass of left axilla 12/07/2013    Past Surgical History:  Procedure Laterality Date  . CESAREAN SECTION     x 4  . excision of lymph nodes Bilateral 2014   bilateral under arms.    Prior to Admission medications   Medication Sig Start Date End Date Taking? Authorizing Provider  albuterol (PROVENTIL) (2.5 MG/3ML) 0.083% nebulizer solution Take 2.5 mg by nebulization 3 (three) times daily.    Historical Provider, MD  ALBUTEROL IN Inhale 1-2 puffs into the lungs as needed.    Historical Provider, MD  azithromycin (ZITHROMAX) 250 MG tablet Take 2 tabs PO x 1 dose, then 1 tab PO QD x 4 days 05/01/15   Boris Lown, DO  benzonatate (TESSALON PERLES) 100 MG capsule Take 1 capsule (100 mg total) by mouth 3 (three) times daily  as needed for cough (Take 1-2 per dose). 11/26/15   Jenise V Bacon Menshew, PA-C  ferrous sulfate (EQL SLOW RELEASE IRON) 160 (50 Fe) MG TBCR SR tablet Take 1 tablet (160 mg total) by mouth daily. 11/10/16   Earleen Newport, MD  fluticasone (FLONASE) 50 MCG/ACT nasal spray Place 1 spray into both nostrils daily. 11/26/15   Jenise V Bacon Menshew, PA-C  guaiFENesin-codeine 100-10 MG/5ML syrup Take 5 mLs by mouth 3 (three) times daily as needed for cough. 11/26/15   Jenise V Bacon Menshew, PA-C  HYDROcodone-homatropine (HYCODAN) 5-1.5 MG/5ML syrup Take 5 mLs by mouth every 6 (six) hours as needed for cough. 05/06/15   Sheryl Precious Haws, DO  ibuprofen (ADVIL,MOTRIN) 600 MG tablet Take 600 mg by mouth every 6 (six) hours as needed.    Historical Provider, MD  Multiple Vitamin (MULTIVITAMIN) tablet Take 1 tablet by mouth daily.    Historical Provider, MD  pantoprazole (PROTONIX) 40 MG tablet Take 40 mg by mouth daily.    Historical Provider, MD  pantoprazole (PROTONIX) 40 MG tablet Take 1 tablet (40 mg total) by mouth daily. 11/10/16 11/10/17  Earleen Newport, MD  predniSONE (STERAPRED UNI-PAK 21 TAB) 10 MG (21) TBPK tablet Take 1 tablet (10 mg total) by mouth as directed. Dose pack as directed 11/26/15   Dannielle Karvonen Menshew, PA-C  sucralfate (CARAFATE) 1 g tablet Take 1 tablet (1 g total) by mouth 4 (four) times  daily. 11/10/16 11/10/17  Earleen Newport, MD  tiotropium (SPIRIVA) 18 MCG inhalation capsule Place 18 mcg into inhaler and inhale 2 (two) times daily.    Historical Provider, MD  VITAMIN D, CHOLECALCIFEROL, PO Take 1 tablet by mouth daily.    Historical Provider, MD    No Known Allergies  No family history on file.  Social History Social History  Substance Use Topics  . Smoking status: Current Every Day Smoker    Packs/day: 0.50    Years: 30.00    Types: Cigarettes  . Smokeless tobacco: Never Used  . Alcohol use Yes    Review of Systems Constitutional: Negative for  fever. Cardiovascular: Negative for chest pain. Respiratory: Negative for shortness of breath. Gastrointestinal: Right lower quadrant abdominal pain. Negative for nausea vomiting or diarrhea. Normal bowel movements including today. Genitourinary: Negative for dysuria negative for hematuria Neurological: Negative for headache 10-point ROS otherwise negative.  ____________________________________________   PHYSICAL EXAM:  VITAL SIGNS: ED Triage Vitals  Enc Vitals Group     BP 03/21/17 0924 (!) 144/84     Pulse Rate 03/21/17 0924 83     Resp 03/21/17 0924 18     Temp 03/21/17 0924 97.6 F (36.4 C)     Temp Source 03/21/17 0924 Oral     SpO2 03/21/17 0924 100 %     Weight 03/21/17 0924 200 lb (90.7 kg)     Height 03/21/17 0924 5\' 4"  (1.626 m)     Head Circumference --      Peak Flow --      Pain Score 03/21/17 0925 8     Pain Loc --      Pain Edu? --      Excl. in [place]? --     Constitutional: Alert and oriented. Well appearing and in no distress. Eyes: Normal exam ENT   Head: Normocephalic and atraumatic.   Mouth/Throat: Mucous membranes are moist. Cardiovascular: Normal rate, regular rhythm. No murmur Respiratory: Normal respiratory effort without tachypnea nor retractions. Breath sounds are clear Gastrointestinal: Soft, mild suprapubic and right lower quadrant tenderness palpation. No rebound or guarding. No distention. Musculoskeletal: Nontender with normal range of motion in all extremities Neurologic:  Normal speech and language. No gross focal neurologic deficits Skin:  Skin is warm, dry and intact.  Psychiatric: Mood and affect are normal.   ____________________________________________     RADIOLOGY  IMPRESSION: 1. No acute intra-abdominal or pelvic process identified. 2. Mildly enlarged bilateral iliac and pelvic sidewall nodes as above, indeterminate, but may be reactive in nature. 3. Mild colonic diverticulosis without evidence for  acute diverticulitis. 4. Fibroid uterus. 5. Decreased density within the cardiac blood pool, suggestive of anemia. Correlation with laboratory values suggested.  ____________________________________________   INITIAL IMPRESSION / ASSESSMENT AND PLAN / ED COURSE  Pertinent labs & imaging results that were available during my care of the patient were reviewed by me and considered in my medical decision making (see chart for details).  Patient presents to the emergency department with right lower quadrant abdominal pain worsening over the past 4 days. Patient states it started as a very mild pain but is now progressed to moderate. Patient states she has been taking Gas-X without relief. Patient states she saw her OB/GYN on Monday had an exam performed, denies any vaginal bleeding or discharge. States she is anemic and is prescribed iron supplements which she once again began taking 4 days ago. Patient also states chronic kidney disease. Patient's labs show an acute  on chronic renal insufficiency with a creatinine of 2.7, baseline appears to be close to 1. 8. Urinalysis shows some protein otherwise negative. Given the patient's continued right lower quadrant tenderness we'll obtain a CT scan without contrast. We will dose IV fluids pain and nausea medication while awaiting CT results.  CT results are largely normal. Patient continues to appear well in the emergency department. We'll discharge with a short course of pain medication and have her follow back up with her primary care doctor. Patient's lab abnormalities show anemia however this is known to the patient, she states 8.8 is increased from her prior lab work she has had performed. It appears to be slightly decreased from the last lab work we have on file however the patient states she just restarted taking iron supplements on Monday. I discussed my normal abdominal pain return precautions, patient is followed back up with her primary care doctor  this coming week for recheck.  ____________________________________________   FINAL CLINICAL IMPRESSION(S) / ED DIAGNOSES  Right lower quadrant abdominal pain    Harvest Dark, MD 03/21/17 1452

## 2017-03-21 NOTE — ED Notes (Signed)
Returned from CT scan.

## 2017-03-21 NOTE — ED Triage Notes (Signed)
Pt c/o lower abd pain/cramping since Monday, states she had her routine pelvic exam Monday and felt some pressure then but blamed it on the need to urinate.. States she feels bloated/gassy. Denies any vaginal discharge or painful urination.

## 2017-05-14 ENCOUNTER — Encounter: Payer: Self-pay | Admitting: Emergency Medicine

## 2017-05-14 ENCOUNTER — Emergency Department: Payer: Medicaid Other

## 2017-05-14 ENCOUNTER — Emergency Department
Admission: EM | Admit: 2017-05-14 | Discharge: 2017-05-14 | Disposition: A | Discharge status: Home / Self Care | Payer: Medicaid Other | Attending: Emergency Medicine | Admitting: Emergency Medicine

## 2017-05-14 DIAGNOSIS — N289 Disorder of kidney and ureter, unspecified: Secondary | ICD-10-CM | POA: Diagnosis not present

## 2017-05-14 DIAGNOSIS — R600 Localized edema: Secondary | ICD-10-CM | POA: Insufficient documentation

## 2017-05-14 DIAGNOSIS — F17210 Nicotine dependence, cigarettes, uncomplicated: Secondary | ICD-10-CM | POA: Diagnosis not present

## 2017-05-14 DIAGNOSIS — J45909 Unspecified asthma, uncomplicated: Secondary | ICD-10-CM | POA: Diagnosis not present

## 2017-05-14 DIAGNOSIS — R609 Edema, unspecified: Secondary | ICD-10-CM

## 2017-05-14 DIAGNOSIS — Z79899 Other long term (current) drug therapy: Secondary | ICD-10-CM | POA: Insufficient documentation

## 2017-05-14 DIAGNOSIS — J449 Chronic obstructive pulmonary disease, unspecified: Secondary | ICD-10-CM | POA: Diagnosis not present

## 2017-05-14 DIAGNOSIS — M7989 Other specified soft tissue disorders: Secondary | ICD-10-CM | POA: Diagnosis present

## 2017-05-14 LAB — BASIC METABOLIC PANEL
ANION GAP: 6 (ref 5–15)
BUN: 42 mg/dL — ABNORMAL HIGH (ref 6–20)
CALCIUM: 8.3 mg/dL — AB (ref 8.9–10.3)
CHLORIDE: 112 mmol/L — AB (ref 101–111)
CO2: 20 mmol/L — AB (ref 22–32)
Creatinine, Ser: 4.42 mg/dL — ABNORMAL HIGH (ref 0.44–1.00)
GFR calc non Af Amer: 11 mL/min — ABNORMAL LOW (ref >60)
GFR, EST AFRICAN AMERICAN: 13 mL/min — AB (ref >60)
GLUCOSE: 107 mg/dL — AB (ref 65–99)
POTASSIUM: 4.4 mmol/L (ref 3.5–5.1)
Sodium: 138 mmol/L (ref 135–145)

## 2017-05-14 LAB — CBC
HEMATOCRIT: 30.4 % — AB (ref 35.0–47.0)
HEMOGLOBIN: 9.9 g/dL — AB (ref 12.0–16.0)
MCH: 28.7 pg (ref 26.0–34.0)
MCHC: 32.4 g/dL (ref 32.0–36.0)
MCV: 88.8 fL (ref 80.0–100.0)
Platelets: 371 10*3/uL (ref 150–440)
RBC: 3.43 MIL/uL — AB (ref 3.80–5.20)
RDW: 15.2 % — ABNORMAL HIGH (ref 11.5–14.5)
WBC: 6.3 10*3/uL (ref 3.6–11.0)

## 2017-05-14 LAB — BRAIN NATRIURETIC PEPTIDE: B Natriuretic Peptide: 104 pg/mL — ABNORMAL HIGH (ref 0.0–100.0)

## 2017-05-14 LAB — TROPONIN I

## 2017-05-14 MED ORDER — FUROSEMIDE 40 MG PO TABS
40.0000 mg | ORAL_TABLET | Freq: Once | ORAL | Status: AC
  Administered 2017-05-14: 40 mg via ORAL

## 2017-05-14 MED ORDER — FUROSEMIDE 40 MG PO TABS
ORAL_TABLET | ORAL | Status: AC
  Administered 2017-05-14: 40 mg via ORAL
  Filled 2017-05-14: qty 1

## 2017-05-14 MED ORDER — FUROSEMIDE 40 MG PO TABS: 40.0000 mg | ORAL_TABLET | Freq: Every day | ORAL | 0 refills | Status: DC

## 2017-05-14 NOTE — ED Provider Notes (Signed)
[facility] Emergency Department Provider Note   First MD Initiated Contact with Patient 05/14/17 1140     (approximate)  I have reviewed the triage vital signs and the nursing notes.   HISTORY  Chief Complaint Leg Swelling   HPI Janet Olson is a 47 y.o. female with below list of chronic medical conditions presents with complaint of bilateral lower extremity swelling and tightness over the past 3 months. Patient also admits to shortness of breath but stated that this is her baseline dyspnea secondary to her COPD. Patient denies any chest pain. Patient denies any history of congestive heart failure. Patient denies any orthopnea.   Past Medical History:  Diagnosis Date  . Anemia   . Asthma   . COPD (chronic obstructive pulmonary disease) ([place])   . GERD (gastroesophageal reflux disease)   . Ulcer     Patient Active Problem List   Diagnosis Date Noted  . Mass of left axilla 12/07/2013    Past Surgical History:  Procedure Laterality Date  . CESAREAN SECTION     x 4  . excision of lymph nodes Bilateral 2014   bilateral under arms.    Prior to Admission medications   Medication Sig Start Date End Date Taking? Authorizing Provider  albuterol (PROVENTIL) (2.5 MG/3ML) 0.083% nebulizer solution Take 2.5 mg by nebulization 3 (three) times daily.   Yes [provider]  ALBUTEROL IN Inhale 1-2 puffs into the lungs as needed.   Yes [provider]  ferrous sulfate (EQL SLOW RELEASE IRON) 160 (50 Fe) MG TBCR SR tablet Take 1 tablet (160 mg total) by mouth daily. Patient taking differently: Take 160 mg by mouth 2 (two) times daily.  11/10/16  Yes Earleen Newport, MD  fluticasone (FLONASE) 50 MCG/ACT nasal spray Place 1 spray into both nostrils daily. 11/26/15  Yes Menshew, Dannielle Karvonen, PA-C  Ibuprofen-Diphenhydramine Cit (ADVIL PM PO) Take 1 tablet by mouth at bedtime as needed.   Yes [provider]  Multiple  Vitamin (MULTIVITAMIN) tablet Take 1 tablet by mouth daily.   Yes [provider]  pantoprazole (PROTONIX) 40 MG tablet Take 1 tablet (40 mg total) by mouth daily. Patient taking differently: Take 40 mg by mouth daily as needed.  11/10/16 11/10/17 Yes Earleen Newport, MD  sucralfate (CARAFATE) 1 g tablet Take 1 tablet (1 g total) by mouth 4 (four) times daily. 11/10/16 11/10/17 Yes Earleen Newport, MD  tiotropium (SPIRIVA) 18 MCG inhalation capsule Place 18 mcg into inhaler and inhale 2 (two) times daily.   Yes [provider]  guaiFENesin-codeine 100-10 MG/5ML syrup Take 5 mLs by mouth 3 (three) times daily as needed for cough. Patient not taking: Reported on 03/21/2017 11/26/15   Menshew, Dannielle Karvonen, PA-C  HYDROcodone-homatropine [facility]) 5-1.5 MG/5ML syrup Take 5 mLs by mouth every 6 (six) hours as needed for cough. Patient not taking: Reported on 03/21/2017 05/06/15   Ferman Hamming L, DO  traMADol (ULTRAM) 50 MG tablet Take 1 tablet (50 mg total) by mouth every 12 (twelve) hours as needed for moderate pain. Patient not taking: Reported on 05/14/2017 03/21/17 03/21/18  Harvest Dark, MD    Allergies Patient has no known allergies.  History reviewed. No pertinent family history.  Social History Social History  Substance Use Topics  . Smoking status: Current Every Day Smoker    Packs/day: 0.50    Years: 30.00    Types: Cigarettes  . Smokeless tobacco: Never Used  .  Alcohol use Yes    Review of Systems Constitutional: No fever/chills Eyes: No visual changes. ENT: No sore throat. Cardiovascular: Denies chest pain. Respiratory: Denies shortness of breath. Gastrointestinal: No abdominal pain.  No nausea, no vomiting.  No diarrhea.  No constipation. Genitourinary: Negative for dysuria. Musculoskeletal: Negative for back pain.Positive bilateral lower extremity swelling Integumentary: Negative for rash. Neurological: Negative for headaches, focal  weakness or numbness.   ____________________________________________   PHYSICAL EXAM:  VITAL SIGNS: ED Triage Vitals  Enc Vitals Group     BP 05/14/17 1008 (!) 161/91     Pulse Rate 05/14/17 1008 86     Resp 05/14/17 1008 (!) 22     Temp 05/14/17 1008 98.7 F (37.1 C)     Temp Source 05/14/17 1008 Oral     SpO2 05/14/17 1008 93 %     Weight 05/14/17 1005 200 lb (90.7 kg)     Height 05/14/17 1005 5\' 6"  (1.676 m)     Head Circumference --      Peak Flow --      Pain Score 05/14/17 1005 7     Pain Loc --      Pain Edu? --      Excl. in [place]? --     Constitutional: Alert and oriented. Well appearing and in no acute distress. Eyes: Conjunctivae are normal. PERRL. EOMI. Head: Atraumatic. Mouth/Throat: Mucous membranes are moist. Oropharynx non-erythematous. Neck: No stridor.   Cardiovascular: Normal rate, regular rhythm. Good peripheral circulation. Grossly normal heart sounds. Respiratory: Normal respiratory effort.  No retractions. Lungs CTAB. Gastrointestinal: Soft and nontender. No distention.  Musculoskeletal: 2+ bilateral lungs every pitting edema. No gross deformities of extremities. Neurologic:  Normal speech and language. No gross focal neurologic deficits are appreciated.  Skin:  Skin is warm, dry and intact. No rash noted. Psychiatric: Mood and affect are normal. Speech and behavior are normal.  ____________________________________________   LABS (all labs ordered are listed, but only abnormal results are displayed)  Labs Reviewed  BASIC METABOLIC PANEL - Abnormal; Notable for the following:       Result Value   Chloride 112 (*)    CO2 20 (*)    Glucose, Bld 107 (*)    BUN 42 (*)    Creatinine, Ser 4.42 (*)    Calcium 8.3 (*)    GFR calc non Af Amer 11 (*)    GFR calc Af Amer 13 (*)    All other components within normal limits  CBC - Abnormal; Notable for the following:    RBC 3.43 (*)    Hemoglobin 9.9 (*)    HCT 30.4 (*)    RDW 15.2 (*)    All other  components within normal limits  BRAIN NATRIURETIC PEPTIDE - Abnormal; Notable for the following:    B Natriuretic Peptide 104.0 (*)    All other components within normal limits  TROPONIN I   ____________________________________________  EKG  ED ECG REPORT I, [place] Jasmain Ahlberg, the attending physician, personally viewed and interpreted this ECG.   Date: 05/16/2017  EKG Time: 10:12 AM  Rate: 88  Rhythm: Normal sinus rhythm  Axis: Normal  Intervals: Normal  ST&T Change: None  ____________________________________________  RADIOLOGY I, [place] Marwah Disbro, personally viewed and evaluated these images (plain radiographs) as part of my medical decision making, as well as reviewing the written report by the radiologist.  CLINICAL DATA:  Shortness of breath for the last few weeks.  EXAM: CHEST  2 VIEW  COMPARISON:  11/26/2015  FINDINGS: Cardiomegaly and mild aortic tortuosity, stable. There is no edema, consolidation, effusion, or pneumothorax. No osseous findings.  IMPRESSION: 1. No evidence of active disease. 2. Chronic cardiomegaly.   Electronically Signed   By: Monte Fantasia M.D.   On: 05/14/2017 10:30 ____________________________________________    Procedures   ____________________________________________   INITIAL IMPRESSION / ASSESSMENT AND PLAN / ED COURSE  Pertinent labs & imaging results that were available during my care of the patient were reviewed by me and considered in my medical decision making (see chart for details).  47 year old female presenting with bilateral lower extremity edema 3 months. Laboratory data notable for acute on chronic renal insufficiency with the patient's creatinine today at 4.42 which increased from 2.74 since March 2018. I advised patient that she should be admitted to the hospital however she adamantly refused. Nursing staff spoke with the patient as well as an take social worker however the patient adamantly refused.  Patient is advised to return to the emergency department immediately if any shortness of breath chest pain worsening lower extremity edema decreased urinary output or any other emergency medical concerns.      ____________________________________________  FINAL CLINICAL IMPRESSION(S) / ED DIAGNOSES  Final diagnoses:  Renal insufficiency  Peripheral edema     MEDICATIONS GIVEN DURING THIS VISIT:  Medications - No data to display   NEW OUTPATIENT MEDICATIONS STARTED DURING THIS VISIT:  New Prescriptions   No medications on file    Modified Medications   No medications on file    Discontinued Medications   AZITHROMYCIN (ZITHROMAX) 250 MG TABLET    Take 2 tabs PO x 1 dose, then 1 tab PO QD x 4 days   BENZONATATE (TESSALON PERLES) 100 MG CAPSULE    Take 1 capsule (100 mg total) by mouth 3 (three) times daily as needed for cough (Take 1-2 per dose).   IBUPROFEN (ADVIL,MOTRIN) 600 MG TABLET    Take 600 mg by mouth every 6 (six) hours as needed.   PREDNISONE (STERAPRED UNI-PAK 21 TAB) 10 MG (21) TBPK TABLET    Take 1 tablet (10 mg total) by mouth as directed. Dose pack as directed     Note:  This document was prepared using Dragon voice recognition software and may include unintentional dictation errors.    Gregor Hams, MD 05/16/17 [phone]

## 2017-05-14 NOTE — ED Triage Notes (Addendum)
Pt c/o swelling and tightness to both legs over last 3 months. Has SHOB at baseline r/t COPD but at times is more short of breath than normal.  Sometimes feels like heart skips a beat but has not had chest pain.  NAD. Mild tachypnea but not labored at this time. Pt also reports recent kidney problems

## 2017-05-17 ENCOUNTER — Encounter: Payer: Self-pay | Admitting: Emergency Medicine

## 2017-05-17 ENCOUNTER — Inpatient Hospital Stay: Payer: Medicaid Other

## 2017-05-17 ENCOUNTER — Inpatient Hospital Stay
Admission: EM | Admit: 2017-05-17 | Discharge: 2017-05-19 | DRG: 683 | Disposition: A | Discharge status: Home / Self Care | Payer: Medicaid Other | Attending: Internal Medicine | Admitting: Internal Medicine

## 2017-05-17 ENCOUNTER — Emergency Department: Payer: Medicaid Other

## 2017-05-17 DIAGNOSIS — K219 Gastro-esophageal reflux disease without esophagitis: Secondary | ICD-10-CM | POA: Diagnosis present

## 2017-05-17 DIAGNOSIS — J4520 Mild intermittent asthma, uncomplicated: Secondary | ICD-10-CM | POA: Diagnosis present

## 2017-05-17 DIAGNOSIS — R609 Edema, unspecified: Secondary | ICD-10-CM | POA: Diagnosis not present

## 2017-05-17 DIAGNOSIS — F17210 Nicotine dependence, cigarettes, uncomplicated: Secondary | ICD-10-CM | POA: Diagnosis present

## 2017-05-17 DIAGNOSIS — J449 Chronic obstructive pulmonary disease, unspecified: Secondary | ICD-10-CM | POA: Diagnosis present

## 2017-05-17 DIAGNOSIS — Z79899 Other long term (current) drug therapy: Secondary | ICD-10-CM

## 2017-05-17 DIAGNOSIS — R739 Hyperglycemia, unspecified: Secondary | ICD-10-CM | POA: Diagnosis present

## 2017-05-17 DIAGNOSIS — Z716 Tobacco abuse counseling: Secondary | ICD-10-CM | POA: Diagnosis not present

## 2017-05-17 DIAGNOSIS — R600 Localized edema: Secondary | ICD-10-CM | POA: Diagnosis present

## 2017-05-17 DIAGNOSIS — N179 Acute kidney failure, unspecified: Secondary | ICD-10-CM | POA: Diagnosis not present

## 2017-05-17 DIAGNOSIS — Z833 Family history of diabetes mellitus: Secondary | ICD-10-CM

## 2017-05-17 DIAGNOSIS — D631 Anemia in chronic kidney disease: Secondary | ICD-10-CM | POA: Diagnosis present

## 2017-05-17 DIAGNOSIS — I120 Hypertensive chronic kidney disease with stage 5 chronic kidney disease or end stage renal disease: Secondary | ICD-10-CM | POA: Diagnosis present

## 2017-05-17 DIAGNOSIS — N186 End stage renal disease: Secondary | ICD-10-CM | POA: Diagnosis present

## 2017-05-17 HISTORY — DX: Acute kidney failure, unspecified: N17.9

## 2017-05-17 HISTORY — DX: Chronic kidney disease, unspecified: N18.9

## 2017-05-17 LAB — URINALYSIS, COMPLETE (UACMP) WITH MICROSCOPIC
BACTERIA UA: NONE SEEN
BILIRUBIN URINE: NEGATIVE
Glucose, UA: 50 mg/dL — AB
Hgb urine dipstick: NEGATIVE
Ketones, ur: NEGATIVE mg/dL
Leukocytes, UA: NEGATIVE
Nitrite: NEGATIVE
Protein, ur: >=300 mg/dL — AB
SPECIFIC GRAVITY, URINE: 1.015 (ref 1.005–1.030)
pH: 5 (ref 5.0–8.0)

## 2017-05-17 LAB — LIPID PANEL
CHOLESTEROL: 198 mg/dL (ref 0–200)
HDL: 63 mg/dL (ref >40)
LDL Cholesterol: 96 mg/dL (ref 0–99)
TRIGLYCERIDES: 197 mg/dL — AB (ref <150)
Total CHOL/HDL Ratio: 3.1 RATIO
VLDL: 39 mg/dL (ref 0–40)

## 2017-05-17 LAB — CBC WITH DIFFERENTIAL/PLATELET
Basophils Absolute: 0 10*3/uL (ref 0–0.1)
Basophils Relative: 1 %
EOS ABS: 0.1 10*3/uL (ref 0–0.7)
Eosinophils Relative: 3 %
HEMATOCRIT: 29.3 % — AB (ref 35.0–47.0)
HEMOGLOBIN: 9.7 g/dL — AB (ref 12.0–16.0)
LYMPHS ABS: 1.4 10*3/uL (ref 1.0–3.6)
LYMPHS PCT: 26 %
MCH: 29.3 pg (ref 26.0–34.0)
MCHC: 33.1 g/dL (ref 32.0–36.0)
MCV: 88.7 fL (ref 80.0–100.0)
Monocytes Absolute: 0.4 10*3/uL (ref 0.2–0.9)
Monocytes Relative: 7 %
NEUTROS ABS: 3.4 10*3/uL (ref 1.4–6.5)
Neutrophils Relative %: 63 %
Platelets: 372 10*3/uL (ref 150–440)
RBC: 3.31 MIL/uL — ABNORMAL LOW (ref 3.80–5.20)
RDW: 14.9 % — ABNORMAL HIGH (ref 11.5–14.5)
WBC: 5.4 10*3/uL (ref 3.6–11.0)

## 2017-05-17 LAB — COMPREHENSIVE METABOLIC PANEL
ALK PHOS: 92 U/L (ref 38–126)
ALT: 15 U/L (ref 14–54)
ANION GAP: 6 (ref 5–15)
AST: 14 U/L — ABNORMAL LOW (ref 15–41)
Albumin: 2.3 g/dL — ABNORMAL LOW (ref 3.5–5.0)
BILIRUBIN TOTAL: 0.4 mg/dL (ref 0.3–1.2)
BUN: 49 mg/dL — ABNORMAL HIGH (ref 6–20)
CALCIUM: 8 mg/dL — AB (ref 8.9–10.3)
CO2: 20 mmol/L — AB (ref 22–32)
CREATININE: 4.4 mg/dL — AB (ref 0.44–1.00)
Chloride: 113 mmol/L — ABNORMAL HIGH (ref 101–111)
GFR, EST AFRICAN AMERICAN: 13 mL/min — AB (ref >60)
GFR, EST NON AFRICAN AMERICAN: 11 mL/min — AB (ref >60)
Glucose, Bld: 134 mg/dL — ABNORMAL HIGH (ref 65–99)
Potassium: 3.7 mmol/L (ref 3.5–5.1)
Sodium: 139 mmol/L (ref 135–145)
TOTAL PROTEIN: 5.9 g/dL — AB (ref 6.5–8.1)

## 2017-05-17 LAB — TROPONIN I

## 2017-05-17 LAB — BRAIN NATRIURETIC PEPTIDE: B NATRIURETIC PEPTIDE 5: 75 pg/mL (ref 0.0–100.0)

## 2017-05-17 LAB — HCG, QUANTITATIVE, PREGNANCY: hCG, Beta Chain, Quant, S: 1 m[IU]/mL (ref <5)

## 2017-05-17 MED ORDER — HEPARIN SODIUM (PORCINE) 5000 UNIT/ML IJ SOLN
5000.0000 [IU] | Freq: Three times a day (TID) | INTRAMUSCULAR | Status: DC
  Administered 2017-05-17 – 2017-05-19 (×5): 5000 [IU] via SUBCUTANEOUS
  Filled 2017-05-17 (×5): qty 1

## 2017-05-17 MED ORDER — TIOTROPIUM BROMIDE MONOHYDRATE 18 MCG IN CAPS
18.0000 ug | ORAL_CAPSULE | Freq: Two times a day (BID) | RESPIRATORY_TRACT | Status: DC
  Administered 2017-05-17 – 2017-05-19 (×4): 18 ug via RESPIRATORY_TRACT
  Filled 2017-05-17: qty 5

## 2017-05-17 MED ORDER — METOPROLOL TARTRATE 25 MG PO TABS
ORAL_TABLET | ORAL | Status: AC
  Filled 2017-05-17: qty 1

## 2017-05-17 MED ORDER — FLUTICASONE PROPIONATE 50 MCG/ACT NA SUSP
1.0000 | Freq: Every day | NASAL | Status: DC | PRN
  Filled 2017-05-17: qty 16

## 2017-05-17 MED ORDER — SUCRALFATE 1 G PO TABS
1.0000 g | ORAL_TABLET | Freq: Four times a day (QID) | ORAL | Status: DC
Start: 2017-05-17 — End: 2017-05-19
  Administered 2017-05-17 – 2017-05-19 (×6): 1 g via ORAL
  Filled 2017-05-17 (×8): qty 1

## 2017-05-17 MED ORDER — PANTOPRAZOLE SODIUM 40 MG PO TBEC: 40.0000 mg | DELAYED_RELEASE_TABLET | Freq: Every day | ORAL | Status: DC | PRN

## 2017-05-17 MED ORDER — METOPROLOL TARTRATE 25 MG PO TABS
25.0000 mg | ORAL_TABLET | Freq: Two times a day (BID) | ORAL | Status: DC
  Administered 2017-05-17 – 2017-05-19 (×4): 25 mg via ORAL
  Filled 2017-05-17 (×3): qty 1

## 2017-05-17 MED ORDER — FERROUS SULFATE 325 (65 FE) MG PO TABS
325.0000 mg | ORAL_TABLET | Freq: Two times a day (BID) | ORAL | Status: DC
  Administered 2017-05-18 – 2017-05-19 (×3): 325 mg via ORAL
  Filled 2017-05-17 (×3): qty 1

## 2017-05-17 MED ORDER — ALBUTEROL SULFATE (2.5 MG/3ML) 0.083% IN NEBU: 2.5000 mg | INHALATION_SOLUTION | Freq: Three times a day (TID) | RESPIRATORY_TRACT | Status: DC | PRN

## 2017-05-17 MED ORDER — AMLODIPINE BESYLATE 5 MG PO TABS
ORAL_TABLET | ORAL | Status: AC
  Filled 2017-05-17: qty 2

## 2017-05-17 MED ORDER — DOCUSATE SODIUM 100 MG PO CAPS
100.0000 mg | ORAL_CAPSULE | Freq: Two times a day (BID) | ORAL | Status: DC | PRN
  Administered 2017-05-18 – 2017-05-19 (×2): 100 mg via ORAL
  Filled 2017-05-17 (×3): qty 1

## 2017-05-17 MED ORDER — ADULT MULTIVITAMIN W/MINERALS CH
1.0000 | ORAL_TABLET | Freq: Every day | ORAL | Status: DC
  Administered 2017-05-17 – 2017-05-19 (×3): 1 via ORAL
  Filled 2017-05-17 (×3): qty 1

## 2017-05-17 MED ORDER — AMLODIPINE BESYLATE 5 MG PO TABS
10.0000 mg | ORAL_TABLET | Freq: Every day | ORAL | Status: DC
  Administered 2017-05-17 – 2017-05-19 (×3): 10 mg via ORAL
  Filled 2017-05-17 (×2): qty 2

## 2017-05-17 NOTE — ED Triage Notes (Addendum)
Pt states that she was seen on Tuesday for fluid retention. Pt states that MD wanted to admit her but she declined. Pt states that she was prescribed Lasix 40 mg daily. Pt states that medication is not helping and she thinks that the fluid retention is getting worse. Pt in NAD at this time. Pt denies shortness of breath at this time.

## 2017-05-17 NOTE — ED Notes (Signed)
Pt returns to the ED after refusing admission on Tuesday. She took the lasix as prescribed and states that her ankle swelling is worse today; her breathing was worse yesterday, and Dr. Owens Shark told her to return if she got worse. Pt alert & oriented with NAD noted.

## 2017-05-17 NOTE — H&P (Signed)
[facility] [place] NAME: Janet Olson    MR#:  [id number]  DATE OF BIRTH:  Jan 25, 1970  DATE OF ADMISSION:  05/17/2017  PRIMARY CARE PHYSICIAN: System, Pcp Not In   REQUESTING/REFERRING PHYSICIAN: [place]  CHIEF COMPLAINT:   Chief Complaint  Patient presents with  . Leg Swelling    HISTORY OF PRESENT ILLNESS: Janet Olson  is a 47 y.o. female with a known history of Asthma, chronic kidney disease, COPD, and gastric reflux disease, gastric ulcer- lost her insurance so for last 3 years does not go for regular checkup to any physician. She comes to emergency room whenever she runs out of her medications and get prescriptions for that. For last 2-3 months she noted her legs are getting swollen most of the time and she attributed that to her other stress in the life but finally decided to come to emergency room 3 days ago. At that time she was noted to have acute renal failure and advised to get admitted but she decided not to stay in the hospital as she has other works to do. She was advised by ER physician to take Lasix 40 mg 1 time a day and if her swelling does not get better then come to emergency room in next few days. Today she came back to ER as swelling is not getting better and her renal function is still the same.  PAST MEDICAL HISTORY:   Past Medical History:  Diagnosis Date  . Anemia   . Asthma   . Chronic kidney disease   . COPD (chronic obstructive pulmonary disease) ([place])   . GERD (gastroesophageal reflux disease)   . Ulcer     PAST SURGICAL HISTORY: Past Surgical History:  Procedure Laterality Date  . CESAREAN SECTION     x 4  . excision of lymph nodes Bilateral 2014   bilateral under arms.    SOCIAL HISTORY:  Social History  Substance Use Topics  . Smoking status: Current Every Day Smoker    Packs/day: 0.50    Years: 30.00    Types: Cigarettes  . Smokeless tobacco: Never Used  . Alcohol use Yes    FAMILY  HISTORY:  Family History  Problem Relation Age of Onset  . Diabetes Mother     DRUG ALLERGIES: No Known Allergies  REVIEW OF SYSTEMS:   CONSTITUTIONAL: No fever,Positive for fatigue or weakness.  EYES: No blurred or double vision.  EARS, NOSE, AND THROAT: No tinnitus or ear pain.  RESPIRATORY: No cough, shortness of breath, wheezing or hemoptysis.  CARDIOVASCULAR: No chest pain, orthopnea, positive for leg edema.  GASTROINTESTINAL: No nausea, vomiting, diarrhea or abdominal pain.  GENITOURINARY: No dysuria, hematuria.  ENDOCRINE: No polyuria, nocturia,  HEMATOLOGY: No anemia, easy bruising or bleeding SKIN: No rash or lesion. MUSCULOSKELETAL: No joint pain or arthritis.   NEUROLOGIC: No tingling, numbness, weakness.  PSYCHIATRY: No anxiety or depression.   MEDICATIONS AT HOME:  Prior to Admission medications   Medication Sig Start Date End Date Taking? Authorizing Provider  albuterol (PROVENTIL) (2.5 MG/3ML) 0.083% nebulizer solution Take 2.5 mg by nebulization 3 (three) times daily.   Yes [provider]  ALBUTEROL IN Inhale 1-2 puffs into the lungs as needed.   Yes [provider]  ferrous sulfate (EQL SLOW RELEASE IRON) 160 (50 Fe) MG TBCR SR tablet Take 1 tablet (160 mg total) by mouth daily. Patient taking differently: Take 160 mg by mouth 2 (two) times daily.  11/10/16  Yes Earleen Newport, MD  fluticasone (FLONASE) 50 MCG/ACT nasal spray Place 1 spray into both nostrils daily. 11/26/15  Yes Menshew, Dannielle Karvonen, PA-C  furosemide (LASIX) 40 MG tablet Take 1 tablet (40 mg total) by mouth daily. 05/14/17 06/13/17 Yes Gregor Hams, MD  Ibuprofen-Diphenhydramine Cit (ADVIL PM PO) Take 1 tablet by mouth at bedtime as needed.   Yes [provider]  Multiple Vitamin (MULTIVITAMIN) tablet Take 1 tablet by mouth daily.   Yes [provider]  pantoprazole (PROTONIX) 40 MG tablet Take 1 tablet (40 mg total) by mouth daily. Patient taking  differently: Take 40 mg by mouth daily as needed.  11/10/16 11/10/17 Yes Earleen Newport, MD  sucralfate (CARAFATE) 1 g tablet Take 1 tablet (1 g total) by mouth 4 (four) times daily. 11/10/16 11/10/17 Yes Earleen Newport, MD  tiotropium (SPIRIVA) 18 MCG inhalation capsule Place 18 mcg into inhaler and inhale 2 (two) times daily.   Yes [provider]  guaiFENesin-codeine 100-10 MG/5ML syrup Take 5 mLs by mouth 3 (three) times daily as needed for cough. Patient not taking: Reported on 03/21/2017 11/26/15   Menshew, Dannielle Karvonen, PA-C  HYDROcodone-homatropine [facility]) 5-1.5 MG/5ML syrup Take 5 mLs by mouth every 6 (six) hours as needed for cough. Patient not taking: Reported on 03/21/2017 05/06/15   Ferman Hamming L, DO  traMADol (ULTRAM) 50 MG tablet Take 1 tablet (50 mg total) by mouth every 12 (twelve) hours as needed for moderate pain. Patient not taking: Reported on 05/17/2017 03/21/17 03/21/18  Harvest Dark, MD      PHYSICAL EXAMINATION:   VITAL SIGNS: Blood pressure (!) 154/96, pulse 85, temperature 98.7 F (37.1 C), temperature source Oral, resp. rate 19, height 5\' 6"  (1.676 m), weight 90.7 kg (200 lb), last menstrual period 04/29/2017, SpO2 100 %.  GENERAL:  47 y.o.-year-old patient lying in the bed with no acute distress.  EYES: Pupils equal, round, reactive to light and accommodation. No scleral icterus. Extraocular muscles intact.  HEENT: Head atraumatic, normocephalic. Oropharynx and nasopharynx clear.  NECK:  Supple, no jugular venous distention. No thyroid enlargement, no tenderness.  LUNGS: Normal breath sounds bilaterally, no wheezing, rales,rhonchi or crepitation. No use of accessory muscles of respiration.  CARDIOVASCULAR: S1, S2 normal. No murmurs, rubs, or gallops.  ABDOMEN: Soft, nontender, nondistended. Bowel sounds present. No organomegaly or mass.  EXTREMITIES: Bilateral pedal edema, no cyanosis, or clubbing.  NEUROLOGIC: Cranial nerves II  through XII are intact. Muscle strength 5/5 in all extremities. Sensation intact. Gait not checked.  PSYCHIATRIC: The patient is alert and oriented x 3.  SKIN: No obvious rash, lesion, or ulcer.   LABORATORY PANEL:   CBC  Recent Labs Lab 05/14/17 1010 05/17/17 1436  WBC 6.3 5.4  HGB 9.9* 9.7*  HCT 30.4* 29.3*  PLT 371 372  MCV 88.8 88.7  MCH 28.7 29.3  MCHC 32.4 33.1  RDW 15.2* 14.9*  LYMPHSABS  --  1.4  MONOABS  --  0.4  EOSABS  --  0.1  BASOSABS  --  0.0   ------------------------------------------------------------------------------------------------------------------  Chemistries   Recent Labs Lab 05/14/17 1010 05/17/17 1436  NA 138 139  K 4.4 3.7  CL 112* 113*  CO2 20* 20*  GLUCOSE 107* 134*  BUN 42* 49*  CREATININE 4.42* 4.40*  CALCIUM 8.3* 8.0*  AST  --  14*  ALT  --  15  ALKPHOS  --  92  BILITOT  --  0.4   ------------------------------------------------------------------------------------------------------------------  estimated creatinine clearance is 17.6 mL/min (A) (by C-G formula based on SCr of 4.4 mg/dL (H)). ------------------------------------------------------------------------------------------------------------------ No results for input(s): TSH, T4TOTAL, T3FREE, THYROIDAB in the last 72 hours.  Invalid input(s): FREET3   Coagulation profile No results for input(s): INR, PROTIME in the last 168 hours. ------------------------------------------------------------------------------------------------------------------- No results for input(s): DDIMER in the last 72 hours. -------------------------------------------------------------------------------------------------------------------  Cardiac Enzymes  Recent Labs Lab 05/14/17 1010  TROPONINI <0.03   ------------------------------------------------------------------------------------------------------------------ Invalid input(s):  POCBNP  ---------------------------------------------------------------------------------------------------------------  Urinalysis    Component Value Date/Time   COLORURINE YELLOW (A) 05/17/2017 1435   APPEARANCEUR HAZY (A) 05/17/2017 1435   LABSPEC 1.015 05/17/2017 1435   PHURINE 5.0 05/17/2017 1435   GLUCOSEU 50 (A) 05/17/2017 1435   HGBUR NEGATIVE 05/17/2017 1435   BILIRUBINUR NEGATIVE 05/17/2017 1435   KETONESUR NEGATIVE 05/17/2017 1435   PROTEINUR >=300 (A) 05/17/2017 1435   NITRITE NEGATIVE 05/17/2017 1435   LEUKOCYTESUR NEGATIVE 05/17/2017 1435     RADIOLOGY: Dg Chest 2 View  Result Date: 05/17/2017 CLINICAL DATA:  Fluid retention. Shortness of breath over the last 4-5 days. EXAM: CHEST  2 VIEW COMPARISON:  Two-view chest x-ray 05/14/2017. FINDINGS: Cardiac enlargement is stable. There is no edema or effusion to suggest failure. The lungs are clear. The visualized soft tissues and bony thorax are unremarkable. IMPRESSION: 1. Stable cardiomegaly without failure. 2. No acute cardiopulmonary disease. Electronically Signed   By: San Morelle M.D.   On: 05/17/2017 18:37    EKG: Orders placed or performed during the hospital encounter of 05/17/17  . ED EKG  . ED EKG  . EKG 12-Lead  . EKG 12-Lead    IMPRESSION AND PLAN:  * Acute renal failure   We will do renal ultrasound and call nephrology consult for further management.    avoid nephrotoxic agents.    her blood pressure is high so I will start on the medication for better control.  * Hypertension   Started on amlodipine and metoprolol for better blood pressure control.  * Hyperglycemia   Blood glucose is high, I will check hemoglobin A1c and manage accordingly.  * Check lipid panel as patient is not following with any doctor.  * Normocytic anemia, likely secondary to chronic kidney disease.   Continue iron supplements.  * Active smoking   Counseled to quit smoking for 4 minutes and offered nicotine  patch.       All the records are reviewed and case discussed with ED provider. Management plans discussed with the patient, family and they are in agreement.  CODE STATUS: full code. Code Status History    This patient does not have a recorded code status. Please follow your organizational policy for patients in this situation.       TOTAL TIME TAKING CARE OF THIS PATIENT:50 minutes.    Vaughan Basta M.D on 05/17/2017   Between 7am to 6pm - Pager - [phone]  After 6pm go to [email or web address] - password [email or web address] [facility]  Office  [phone]  CC: Primary care physician; System, Pcp Not In   Note: This dictation was prepared with Dragon dictation along with smaller phrase technology. Any transcriptional errors that result from this process are unintentional.

## 2017-05-17 NOTE — ED Provider Notes (Signed)
[facility] Emergency Department Provider Note  ____________________________________________   I have reviewed the triage vital signs and the nursing notes.   HISTORY  Chief Complaint Leg Swelling    HPI Janet Olson is a 47 y.o. female COPD, anemia heavy menstrual periods asthma presents today with leg swelling. Patient does have baseline renal insufficiency of long standing however she was seen here a few days ago and found to have a creatinine over 4. Her baseline is around 2.3 year 2.4. This is a significant difference of the last month. She has had lower extremity swelling. She has chronic mild shortness of breath from her COPD and states she may have been a little more short of breath yesterday.. She denies any significant orthopnea. However, the leg swelling is of concern to her. When she was seen here a few days ago and it was noted that her creatinine is so markedly worsened, she was offered and advised admission but she refused and went home. She has been trying to take Lasix at home. However her leg swelling persists and she feels that she isas noted above slightly more short of breath. Patient does work standing up. She has no calf pain. No x-rays been going on gradually for the last month or longer. She is here because she is concerned about her kidneys and feels that she should be admitted at this time. Patient has a history of anemia she does take naproxen also when she has painful menses. She denies pregnancy.    Past Medical History:  Diagnosis Date  . Anemia   . Asthma   . COPD (chronic obstructive pulmonary disease) ([place])   . GERD (gastroesophageal reflux disease)   . Ulcer     Patient Active Problem List   Diagnosis Date Noted  . Mass of left axilla 12/07/2013    Past Surgical History:  Procedure Laterality Date  . CESAREAN SECTION     x 4  . excision of lymph nodes Bilateral 2014   bilateral under arms.    Prior to  Admission medications   Medication Sig Start Date End Date Taking? Authorizing Provider  albuterol (PROVENTIL) (2.5 MG/3ML) 0.083% nebulizer solution Take 2.5 mg by nebulization 3 (three) times daily.    [provider]  ALBUTEROL IN Inhale 1-2 puffs into the lungs as needed.    [provider]  ferrous sulfate (EQL SLOW RELEASE IRON) 160 (50 Fe) MG TBCR SR tablet Take 1 tablet (160 mg total) by mouth daily. Patient taking differently: Take 160 mg by mouth 2 (two) times daily.  11/10/16   Earleen Newport, MD  fluticasone (FLONASE) 50 MCG/ACT nasal spray Place 1 spray into both nostrils daily. 11/26/15   Menshew, Dannielle Karvonen, PA-C  furosemide (LASIX) 40 MG tablet Take 1 tablet (40 mg total) by mouth daily. 05/14/17 06/13/17  Gregor Hams, MD  guaiFENesin-codeine 100-10 MG/5ML syrup Take 5 mLs by mouth 3 (three) times daily as needed for cough. Patient not taking: Reported on 03/21/2017 11/26/15   Menshew, Dannielle Karvonen, PA-C  HYDROcodone-homatropine [facility]) 5-1.5 MG/5ML syrup Take 5 mLs by mouth every 6 (six) hours as needed for cough. Patient not taking: Reported on 03/21/2017 05/06/15   Ferman Hamming L, DO  Ibuprofen-Diphenhydramine Cit (ADVIL PM PO) Take 1 tablet by mouth at bedtime as needed.    [provider]  Multiple Vitamin (MULTIVITAMIN) tablet Take 1 tablet by mouth daily.    [provider]  pantoprazole (PROTONIX) 40  MG tablet Take 1 tablet (40 mg total) by mouth daily. Patient taking differently: Take 40 mg by mouth daily as needed.  11/10/16 11/10/17  Earleen Newport, MD  sucralfate (CARAFATE) 1 g tablet Take 1 tablet (1 g total) by mouth 4 (four) times daily. 11/10/16 11/10/17  Earleen Newport, MD  tiotropium (SPIRIVA) 18 MCG inhalation capsule Place 18 mcg into inhaler and inhale 2 (two) times daily.    [provider]  traMADol (ULTRAM) 50 MG tablet Take 1 tablet (50 mg total) by mouth every 12 (twelve) hours as  needed for moderate pain. Patient not taking: Reported on 05/14/2017 03/21/17 03/21/18  Harvest Dark, MD    Allergies Patient has no known allergies.  No family history on file.  Social History Social History  Substance Use Topics  . Smoking status: Current Every Day Smoker    Packs/day: 0.50    Years: 30.00    Types: Cigarettes  . Smokeless tobacco: Never Used  . Alcohol use Yes    Review of Systems Constitutional: No fever/chills Eyes: No visual changes. ENT: No sore throat. No stiff neck no neck pain Cardiovascular: Denies chest pain. Respiratory: See history of present illness regarding shortness of breath. Gastrointestinal:   no vomiting.  No diarrhea.  No constipation. Genitourinary: Negative for dysuria. Musculoskeletal: Positive lower extremity swelling Skin: Negative for rash. Neurological: Negative for severe headaches, focal weakness or numbness. 10-point ROS otherwise negative.  ____________________________________________   PHYSICAL EXAM:  VITAL SIGNS: ED Triage Vitals  Enc Vitals Group     BP 05/17/17 1415 (!) 150/79     Pulse Rate 05/17/17 1415 97     Resp 05/17/17 1415 16     Temp 05/17/17 1415 98.7 F (37.1 C)     Temp Source 05/17/17 1415 Oral     SpO2 05/17/17 1415 100 %     Weight 05/17/17 1415 200 lb (90.7 kg)     Height 05/17/17 1415 5\' 6"  (1.676 m)     Head Circumference --      Peak Flow --      Pain Score 05/17/17 1803 7     Pain Loc --      Pain Edu? --      Excl. in [place]? --     Constitutional: Alert and oriented. Well appearing and in no acute distress. Eyes: Conjunctivae are normal. PERRL. EOMI. Head: Atraumatic. Nose: No congestion/rhinnorhea. Mouth/Throat: Mucous membranes are moist.  Oropharynx non-erythematous. Neck: No stridor.   Nontender with no meningismus Cardiovascular: Normal rate, regular rhythm. Grossly normal heart sounds.  Good peripheral circulation. Respiratory: Normal respiratory effort.  No retractions.  Lungs CTAB. Abdominal: Soft and nontender. No distention. No guarding no rebound Back:  There is no focal tenderness or step off.  there is no midline tenderness there are no lesions noted. there is no CVA tenderness Musculoskeletal: No lower extremity tenderness, no upper extremity tenderness. No joint effusions, no DVT signs strong distal pulses Symmetric 2-3+ bilateral pitting edema Neurologic:  Normal speech and language. No gross focal neurologic deficits are appreciated.  Skin:  Skin is warm, dry and intact. No rash noted. Psychiatric: Mood and affect are normal. Speech and behavior are normal.  ____________________________________________   LABS (all labs ordered are listed, but only abnormal results are displayed)  Labs Reviewed  CBC WITH DIFFERENTIAL/PLATELET - Abnormal; Notable for the following:       Result Value   RBC 3.31 (*)    Hemoglobin 9.7 (*)  HCT 29.3 (*)    RDW 14.9 (*)    All other components within normal limits  COMPREHENSIVE METABOLIC PANEL - Abnormal; Notable for the following:    Chloride 113 (*)    CO2 20 (*)    Glucose, Bld 134 (*)    BUN 49 (*)    Creatinine, Ser 4.40 (*)    Calcium 8.0 (*)    Total Protein 5.9 (*)    Albumin 2.3 (*)    AST 14 (*)    GFR calc non Af Amer 11 (*)    GFR calc Af Amer 13 (*)    All other components within normal limits  URINALYSIS, COMPLETE (UACMP) WITH MICROSCOPIC - Abnormal; Notable for the following:    Color, Urine YELLOW (*)    APPearance HAZY (*)    Glucose, UA 50 (*)    Protein, ur >=300 (*)    Squamous Epithelial / LPF 0-5 (*)    All other components within normal limits  BRAIN NATRIURETIC PEPTIDE  TROPONIN I   ____________________________________________  EKG  I personally interpreted any EKGs ordered by me or triage Sinus rhythm rate 84 bpm no acute ST elevation or depression normal axis nonspecific ST changes ____________________________________________  RADIOLOGY  I reviewed any imaging  ordered by me or triage that were performed during my shift and, if possible, patient and/or family made aware of any abnormal findings. ____________________________________________   PROCEDURES  Procedure(s) performed: None  Procedures  Critical Care performed: None  ____________________________________________   INITIAL IMPRESSION / ASSESSMENT AND PLAN / ED COURSE  Pertinent labs & imaging results that were available during my care of the patient were reviewed by me and considered in my medical decision making (see chart for details).  Patient with acute renal insufficiency and what possibly appears to be naproxen with lots of protein in her urine and lower extremity edema. She is getting worse and not better on Lasix clinically, she likely will require admission for further evaluation.    ____________________________________________   FINAL CLINICAL IMPRESSION(S) / ED DIAGNOSES  Final diagnoses:  None      This chart was dictated using voice recognition software.  Despite best efforts to proofread,  errors can occur which can change meaning.      Schuyler Amor, MD 05/17/17 [id number]

## 2017-05-18 ENCOUNTER — Inpatient Hospital Stay (HOSPITAL_COMMUNITY)
Admit: 2017-05-18 | Discharge: 2017-05-18 | Disposition: A | Discharge status: Home / Self Care | Payer: Medicaid Other | Attending: Internal Medicine | Admitting: Internal Medicine

## 2017-05-18 DIAGNOSIS — I5031 Acute diastolic (congestive) heart failure: Secondary | ICD-10-CM

## 2017-05-18 LAB — BASIC METABOLIC PANEL
Anion gap: 6 (ref 5–15)
BUN: 51 mg/dL — ABNORMAL HIGH (ref 6–20)
CO2: 21 mmol/L — AB (ref 22–32)
CREATININE: 4.38 mg/dL — AB (ref 0.44–1.00)
Calcium: 8.3 mg/dL — ABNORMAL LOW (ref 8.9–10.3)
Chloride: 113 mmol/L — ABNORMAL HIGH (ref 101–111)
GFR calc Af Amer: 13 mL/min — ABNORMAL LOW (ref >60)
GFR calc non Af Amer: 11 mL/min — ABNORMAL LOW (ref >60)
GLUCOSE: 96 mg/dL (ref 65–99)
Potassium: 4.3 mmol/L (ref 3.5–5.1)
Sodium: 140 mmol/L (ref 135–145)

## 2017-05-18 LAB — ECHOCARDIOGRAM COMPLETE
HEIGHTINCHES: 66 in
WEIGHTICAEL: 3793.6 [oz_av]

## 2017-05-18 LAB — CBC
HCT: 31.3 % — ABNORMAL LOW (ref 35.0–47.0)
Hemoglobin: 10.3 g/dL — ABNORMAL LOW (ref 12.0–16.0)
MCH: 29 pg (ref 26.0–34.0)
MCHC: 32.8 g/dL (ref 32.0–36.0)
MCV: 88.5 fL (ref 80.0–100.0)
Platelets: 416 K/uL (ref 150–440)
RBC: 3.53 MIL/uL — ABNORMAL LOW (ref 3.80–5.20)
RDW: 15.1 % — ABNORMAL HIGH (ref 11.5–14.5)
WBC: 7.6 K/uL (ref 3.6–11.0)

## 2017-05-18 MED ORDER — FUROSEMIDE 10 MG/ML IJ SOLN
80.0000 mg | Freq: Every day | INTRAMUSCULAR | Status: DC
  Administered 2017-05-18 – 2017-05-19 (×2): 80 mg via INTRAVENOUS
  Filled 2017-05-18 (×2): qty 8

## 2017-05-18 NOTE — Progress Notes (Signed)
[facility] [place] NAME: Janet Olson    MR#:  [id number]  DATE OF BIRTH:  1970-06-09  SUBJECTIVE:   Patient here with lower extremity edema and found to have creatinine above 4. Patient reports she is making good urine. Patient denies chest pain or shortness of breath.  REVIEW OF SYSTEMS:    Review of Systems  Constitutional: Negative for fever, chills weight loss HENT: Negative for ear pain, nosebleeds, congestion, facial swelling, rhinorrhea, neck pain, neck stiffness and ear discharge.   Respiratory: Negative for cough, shortness of breath, wheezing  Cardiovascular: Negative for chest pain, palpitations and Positive mild pedal swelling.  Gastrointestinal: Negative for heartburn, abdominal pain, vomiting, diarrhea or consitpation Genitourinary: Negative for dysuria, urgency, frequency, hematuria Musculoskeletal: Negative for back pain or joint pain Neurological: Negative for dizziness, seizures, syncope, focal weakness,  numbness and headaches.  Hematological: Does not bruise/bleed easily.  Psychiatric/Behavioral: Negative for hallucinations, confusion, dysphoric mood    Tolerating Diet: yes      DRUG ALLERGIES:  No Known Allergies  VITALS:  Blood pressure (!) 142/96, pulse 85, temperature 98.7 F (37.1 C), temperature source Oral, resp. rate 18, height 5\' 6"  (1.676 m), weight 107.5 kg (237 lb 1.6 oz), last menstrual period 04/29/2017, SpO2 100 %.  PHYSICAL EXAMINATION:  Constitutional: Appears well-developed and well-nourished. No distress. HENT: Normocephalic. Marland Kitchen Oropharynx is clear and moist.  Eyes: Conjunctivae and EOM are normal. PERRLA, no scleral icterus.  Neck: Normal ROM. Neck supple. No JVD. No tracheal deviation. CVS: RRR, S1/S2 +, no murmurs, no gallops, no carotid bruit.  Pulmonary: Effort and breath sounds normal, no stridor, rhonchi, wheezes, rales.  Abdominal: Soft. BS +,  no distension, tenderness, rebound  or guarding.  Musculoskeletal: Normal range of motion. ++pedal edema and no tenderness.  Neuro: Alert. CN 2-12 grossly intact. No focal deficits. Skin: Skin is warm and dry. No rash noted. Psychiatric: Normal mood and affect.      LABORATORY PANEL:   CBC  Recent Labs Lab 05/18/17 0348  WBC 7.6  HGB 10.3*  HCT 31.3*  PLT 416   ------------------------------------------------------------------------------------------------------------------  Chemistries   Recent Labs Lab 05/17/17 1436 05/18/17 0348  NA 139 140  K 3.7 4.3  CL 113* 113*  CO2 20* 21*  GLUCOSE 134* 96  BUN 49* 51*  CREATININE 4.40* 4.38*  CALCIUM 8.0* 8.3*  AST 14*  --   ALT 15  --   ALKPHOS 92  --   BILITOT 0.4  --    ------------------------------------------------------------------------------------------------------------------  Cardiac Enzymes  Recent Labs Lab 05/14/17 1010 05/17/17 1436  TROPONINI <0.03 <0.03   ------------------------------------------------------------------------------------------------------------------  RADIOLOGY:  Dg Chest 2 View  Result Date: 05/17/2017 CLINICAL DATA:  Fluid retention. Shortness of breath over the last 4-5 days. EXAM: CHEST  2 VIEW COMPARISON:  Two-view chest x-ray 05/14/2017. FINDINGS: Cardiac enlargement is stable. There is no edema or effusion to suggest failure. The lungs are clear. The visualized soft tissues and bony thorax are unremarkable. IMPRESSION: 1. Stable cardiomegaly without failure. 2. No acute cardiopulmonary disease. Electronically Signed   By: San Morelle M.D.   On: 05/17/2017 18:37   US Renal  Result Date: 05/17/2017 CLINICAL DATA:  Acute renal failure EXAM: RENAL / URINARY TRACT ULTRASOUND COMPLETE COMPARISON:  03/21/2017 CT FINDINGS: Right Kidney: Length: 11.7 cm. Loss of cortical-medullary distinction with increased echogenicity of the right kidney. A 7 mm cyst is noted within the cortex of the right upper pole. No  nephrolithiasis  nor obstructive uropathy. Left Kidney: Length: 11 cm. Loss of cortical-medullary distinction with increased echogenicity of the left kidney. No focal mass or obstructive uropathy. No nephrolithiasis. Bladder: Appears normal for degree of bladder distention. Bilateral ureteral urinary jets are demonstrated within the bladder consistent with patent ureters. IMPRESSION: Echogenic kidneys bilaterally consistent with medical renal disease. Subcentimeter cyst in the upper pole of the right kidney measuring 7 mm. Patent bilateral ureters are demonstrated. Electronically Signed   By: Ashley Royalty M.D.   On: 05/17/2017 20:46     ASSESSMENT AND PLAN:    47 year old female with history of COPD and chronic kidney disease who presented with lower extremities swelling and found to have creatinine above 4.   1. Acute on chronic kidney disease: Renal ultrasound shows medical renal disease without hydronephrosis. Follow up on nephrology recommendations No acute indication for hemodialysis. Patient advised to stop ibuprofen/NSAIDs  2. Lower extremity edema: Follow up on echocardiogram  3. Elevated blood pressure with proteinuria:, No previous diagnosis of hypertension Continue metoprolol and Norvasc  4. Mild intermittent asthma: Continue inhalers  5. Anemia of chronic kidney disease: Continue ferrous sulfate 6. Tobacco dependence: Patient is encouraged to quit smoking. Counseling was provided for 4 minutes.   Management plans discussed with the patient and she is in agreement.  CODE STATUS: full  TOTAL TIME TAKING CARE OF THIS PATIENT: 30 minutes.     POSSIBLE D/C tomorrow, DEPENDING ON CLINICAL CONDITION.   Kensli Bowley M.D on 05/18/2017 at 8:48 AM  Between 7am to 6pm - Pager - [phone] After 6pm go to [email or web address] - password [email or web address] [facility]  Office  [phone]  CC: Primary care physician; System, Pcp Not In  Note: This dictation was prepared  with Dragon dictation along with smaller phrase technology. Any transcriptional errors that result from this process are unintentional.

## 2017-05-18 NOTE — Progress Notes (Signed)
*  PRELIMINARY RESULTS* Echocardiogram 2D Echocardiogram has been performed.  Janet Olson 05/18/2017, 10:23 AM

## 2017-05-18 NOTE — Consult Note (Signed)
[facility] CONSULT NOTE    Date: 05/18/2017                  Patient Name:  Janet Olson  MRN: [id number]  DOB: [birth year]  Age / Sex: 47 y.o., female         PCP: System, Pcp Not In                 Service Requesting Consult: hospitalist                 Reason for Consult: CKD stage V            History of Present Illness: Patient is a 47 y.o. female with a PMHx of Prior history of chronic kidney disease stage IV, hypertension, anemia, COPD, GERD who was admitted to [facility] on 05/17/2017 for evaluation of worsening lower extremity edema and renal function.  Patient reports that she's been having increasing edema over the past several months. Initially she noted that the swelling would go away after laying down for a period of time. However now her lower extremity edema persists. She also has significant shortness of breath with exertion. Several months ago she came to the emergency department in March. At that time she had an EGFR of 22. Unfortunately the patient does not have any primary care follow-up.  She had renal function testing performed this admission and initial creatinine was 14.4 with an EGFR 13. These readings have been repeated and most recent creatinine was 4.3 with an EGFR of 13. Renal ultrasound was performed which showed poor cortical medullary distinction.  Patient also noted to be anemic. In addition she has significant proteinuria. She's not seen a nephrologist in the past. We spent considerable time talking today that she likely has end-stage renal disease.  In the past the patient also used to take a significant amount of ibuprofen particularly around the time of her menstrual cycle.   Medications: Outpatient medications: Prescriptions Prior to Admission  Medication Sig Dispense Refill Last Dose  . albuterol (PROVENTIL) (2.5 MG/3ML) 0.083% nebulizer solution Take 2.5 mg by nebulization 3 (three) times daily.   PRN at PRN  . ALBUTEROL IN  Inhale 1-2 puffs into the lungs as needed.   PRN at PRN  . ferrous sulfate (EQL SLOW RELEASE IRON) 160 (50 Fe) MG TBCR SR tablet Take 1 tablet (160 mg total) by mouth daily. (Patient taking differently: Take 160 mg by mouth 2 (two) times daily. ) 30 each 6 05/16/2017 at 2000  . fluticasone (FLONASE) 50 MCG/ACT nasal spray Place 1 spray into both nostrils daily. 16 g 0 PRN at PRN  . furosemide (LASIX) 40 MG tablet Take 1 tablet (40 mg total) by mouth daily. 30 tablet 0 05/17/2017 at 0800  . Ibuprofen-Diphenhydramine Cit (ADVIL PM PO) Take 1 tablet by mouth at bedtime as needed.   PRN at PRN  . Multiple Vitamin (MULTIVITAMIN) tablet Take 1 tablet by mouth daily.   05/16/2017 at 0800  . pantoprazole (PROTONIX) 40 MG tablet Take 1 tablet (40 mg total) by mouth daily. (Patient taking differently: Take 40 mg by mouth daily as needed. ) 30 tablet 1 PRN at PRN  . sucralfate (CARAFATE) 1 g tablet Take 1 tablet (1 g total) by mouth 4 (four) times daily. 120 tablet 1 PRN at PRN  . tiotropium (SPIRIVA) 18 MCG inhalation capsule Place 18 mcg into inhaler and inhale 2 (two) times daily.   PRN at PRN  .  guaiFENesin-codeine 100-10 MG/5ML syrup Take 5 mLs by mouth 3 (three) times daily as needed for cough. (Patient not taking: Reported on 03/21/2017) 120 mL 0 Completed Course at Unknown time  . HYDROcodone-homatropine (HYCODAN) 5-1.5 MG/5ML syrup Take 5 mLs by mouth every 6 (six) hours as needed for cough. (Patient not taking: Reported on 03/21/2017) 120 mL 0 Completed Course at Unknown time  . traMADol (ULTRAM) 50 MG tablet Take 1 tablet (50 mg total) by mouth every 12 (twelve) hours as needed for moderate pain. (Patient not taking: Reported on 05/17/2017) 20 tablet 0 Not Taking at Unknown time    Current medications: Current Facility-Administered Medications  Medication Dose Route Frequency Provider Last Rate Last Dose  . albuterol (PROVENTIL) (2.5 MG/3ML) 0.083% nebulizer solution 2.5 mg  2.5 mg Nebulization TID PRN  Vaughan Basta, MD      . amLODipine (NORVASC) tablet 10 mg  10 mg Oral Daily Vaughan Basta, MD   10 mg at 05/18/17 4970  . docusate sodium (COLACE) capsule 100 mg  100 mg Oral BID PRN Vaughan Basta, MD      . ferrous sulfate tablet 325 mg  325 mg Oral BID WC Vaughan Basta, MD   325 mg at 05/18/17 0832  . fluticasone (FLONASE) 50 MCG/ACT nasal spray 1 spray  1 spray Each Nare Daily PRN Vaughan Basta, MD      . heparin injection 5,000 Units  5,000 Units Subcutaneous Q8H Vaughan Basta, MD   5,000 Units at 05/18/17 0602  . metoprolol tartrate (LOPRESSOR) tablet 25 mg  25 mg Oral BID Vaughan Basta, MD   25 mg at 05/18/17 2637  . multivitamin with minerals tablet 1 tablet  1 tablet Oral Daily Vaughan Basta, MD   1 tablet at 05/18/17 [phone]  . pantoprazole (PROTONIX) EC tablet 40 mg  40 mg Oral Daily PRN Vaughan Basta, MD      . sucralfate (CARAFATE) tablet 1 g  1 g Oral QID Vaughan Basta, MD   1 g at 05/18/17 5027  . tiotropium (SPIRIVA) inhalation capsule 18 mcg  18 mcg Inhalation BID Vaughan Basta, MD   18 mcg at 05/18/17 7412      Allergies: No Known Allergies    Past Medical History: Past Medical History:  Diagnosis Date  . Anemia   . Asthma   . Chronic kidney disease   . COPD (chronic obstructive pulmonary disease) ([place])   . GERD (gastroesophageal reflux disease)   . Ulcer      Past Surgical History: Past Surgical History:  Procedure Laterality Date  . CESAREAN SECTION     x 4  . excision of lymph nodes Bilateral 2014   bilateral under arms.     Family History: Family History  Problem Relation Age of Onset  . Diabetes Mother      Social History: Social History   Social History  . Marital status: Single    Spouse name: N/A  . Number of children: N/A  . Years of education: N/A   Occupational History  . Not on file.   Social History Main Topics  . Smoking status:  Current Every Day Smoker    Packs/day: 0.50    Years: 30.00    Types: Cigarettes  . Smokeless tobacco: Never Used  . Alcohol use Yes  . Drug use: No  . Sexual activity: Not on file   Other Topics Concern  . Not on file   Social History Narrative  . No narrative on file  Review of Systems: Review of Systems  Constitutional: Positive for malaise/fatigue. Negative for chills and fever.  HENT: Negative for ear pain, hearing loss and tinnitus.   Eyes: Negative for blurred vision, double vision and photophobia.  Respiratory: Positive for cough, sputum production and shortness of breath.   Cardiovascular: Positive for leg swelling and PND. Negative for chest pain and palpitations.  Gastrointestinal: Negative for abdominal pain, heartburn, nausea and vomiting.  Genitourinary: Negative for dysuria, frequency and urgency.  Musculoskeletal: Negative for back pain, myalgias and neck pain.  Skin: Negative for itching and rash.  Neurological: Negative for dizziness and focal weakness.  Endo/Heme/Allergies: Negative for polydipsia. Does not bruise/bleed easily.  Psychiatric/Behavioral: Negative for depression. The patient is not nervous/anxious.      Vital Signs: Blood pressure (!) 142/96, pulse 85, temperature 98.7 F (37.1 C), temperature source Oral, resp. rate 18, height _0  (1.676 m), weight 107.5 kg (237 lb 1.6 oz), last menstrual period 04/29/2017, SpO2 100 %.  Weight trends: Filed Weights   05/17/17 1415 05/17/17 2052  Weight: 90.7 kg (200 lb) 107.5 kg (237 lb 1.6 oz)    Physical Exam: General: NAD,   Head: Normocephalic, atraumatic.  Eyes: Anicteric, EOMI  Nose: Mucous membranes moist, not inflammed, nonerythematous.  Throat: Oropharynx nonerythematous, no exudate appreciated.   Neck: Supple, trachea midline.  Lungs:  Normal respiratory effort. Clear to auscultation BL without crackles or wheezes.  Heart: RRR. S1 and S2 normal without gallop, murmur, or rubs.   Abdomen:  BS normoactive. Soft, Nondistended, non-tender.  No masses or organomegaly.  Extremities: No pretibial edema.  Neurologic: A&O X3, Motor strength is 5/5 in the all 4 extremities  Skin: No visible rashes, scars.    Lab results: Basic Metabolic Panel:  Recent Labs Lab 05/14/17 1010 05/17/17 1436 05/18/17 0348  NA 138 139 140  K 4.4 3.7 4.3  CL 112* 113* 113*  CO2 20* 20* 21*  GLUCOSE 107* 134* 96  BUN 42* 49* 51*  CREATININE 4.42* 4.40* 4.38*  CALCIUM 8.3* 8.0* 8.3*    Liver Function Tests:  Recent Labs Lab 05/17/17 1436  AST 14*  ALT 15  ALKPHOS 92  BILITOT 0.4  PROT 5.9*  ALBUMIN 2.3*   No results for input(s): LIPASE, AMYLASE in the last 168 hours. No results for input(s): AMMONIA in the last 168 hours.  CBC:  Recent Labs Lab 05/14/17 1010 05/17/17 1436 05/18/17 0348  WBC 6.3 5.4 7.6  NEUTROABS  --  3.4  --   HGB 9.9* 9.7* 10.3*  HCT 30.4* 29.3* 31.3*  MCV 88.8 88.7 88.5  PLT 371 372 416    Cardiac Enzymes:  Recent Labs Lab 05/14/17 1010 05/17/17 1436  TROPONINI <0.03 <0.03    BNP: Invalid input(s): POCBNP  CBG: No results for input(s): GLUCAP in the last 168 hours.  Microbiology: No results found for this or any previous visit.  Coagulation Studies: No results for input(s): LABPROT, INR in the last 72 hours.  Urinalysis:  Recent Labs  05/17/17 1435  COLORURINE YELLOW*  LABSPEC 1.015  PHURINE 5.0  GLUCOSEU 50*  HGBUR NEGATIVE  BILIRUBINUR NEGATIVE  KETONESUR NEGATIVE  PROTEINUR >=300*  NITRITE NEGATIVE  LEUKOCYTESUR NEGATIVE      Imaging: Dg Chest 2 View  Result Date: 05/17/2017 CLINICAL DATA:  Fluid retention. Shortness of breath over the last 4-5 days. EXAM: CHEST  2 VIEW COMPARISON:  Two-view chest x-ray 05/14/2017. FINDINGS: Cardiac enlargement is stable. There is no edema or effusion to suggest failure.  The lungs are clear. The visualized soft tissues and bony thorax are unremarkable. IMPRESSION: 1.  Stable cardiomegaly without failure. 2. No acute cardiopulmonary disease. Electronically Signed   By: San Morelle M.D.   On: 05/17/2017 18:37   US Renal  Result Date: 05/17/2017 CLINICAL DATA:  Acute renal failure EXAM: RENAL / URINARY TRACT ULTRASOUND COMPLETE COMPARISON:  03/21/2017 CT FINDINGS: Right Kidney: Length: 11.7 cm. Loss of cortical-medullary distinction with increased echogenicity of the right kidney. A 7 mm cyst is noted within the cortex of the right upper pole. No nephrolithiasis nor obstructive uropathy. Left Kidney: Length: 11 cm. Loss of cortical-medullary distinction with increased echogenicity of the left kidney. No focal mass or obstructive uropathy. No nephrolithiasis. Bladder: Appears normal for degree of bladder distention. Bilateral ureteral urinary jets are demonstrated within the bladder consistent with patent ureters. IMPRESSION: Echogenic kidneys bilaterally consistent with medical renal disease. Subcentimeter cyst in the upper pole of the right kidney measuring 7 mm. Patent bilateral ureters are demonstrated. Electronically Signed   By: Ashley Royalty M.D.   On: 05/17/2017 20:46      Assessment & Plan: Pt is a 47 y.o. female  with a PMHx of Prior history of chronic kidney disease stage IV, hypertension, anemia, COPD, GERD who was admitted to [facility] on 05/17/2017 for evaluation of worsening lower extremity edema and renal function.   1.  Chronic kidney disease stage with high probability of progression to ESRD. 2.  Anemia of CKD. 3.  Proteinuria. 4.  Hypertension. 5.  Lower extremity edema.   Plan:  We are asked to see the patient for evaluation management of severe renal dysfunction. She's been having increasing lower extremity edema as well as shortness of breath. She is also had renal function deterioration over the past several months. Renal ultrasound revealed poor cortical medullary distinction which is likely consistent with advanced chronic kidney disease.  She has high probability to progress to end-stage renal disease. No urgent indication to start dialysis immediately however I suspect that she will need dialysis within the next 4-6 weeks. We spent considerable time today talking about the potential modalities for renal replacement therapy. We talked about transplant, hemodialysis, and peritoneal dialysis. She is most interested in peritoneal dialysis at this time as she would like to continue to work at some point in time. For now we will start the patient on furosemide 80 mg IV daily. She may be able to be discharged tomorrow with close outpatient follow-up. We will likely plan for peritoneal dialysis catheter placement early next week. Thanks for consultation.

## 2017-05-19 LAB — BASIC METABOLIC PANEL
ANION GAP: 4 — AB (ref 5–15)
BUN: 46 mg/dL — ABNORMAL HIGH (ref 6–20)
CHLORIDE: 113 mmol/L — AB (ref 101–111)
CO2: 22 mmol/L (ref 22–32)
Calcium: 8.4 mg/dL — ABNORMAL LOW (ref 8.9–10.3)
Creatinine, Ser: 3.8 mg/dL — ABNORMAL HIGH (ref 0.44–1.00)
GFR calc Af Amer: 15 mL/min — ABNORMAL LOW (ref >60)
GFR, EST NON AFRICAN AMERICAN: 13 mL/min — AB (ref >60)
Glucose, Bld: 98 mg/dL (ref 65–99)
POTASSIUM: 3.7 mmol/L (ref 3.5–5.1)
SODIUM: 139 mmol/L (ref 135–145)

## 2017-05-19 LAB — HIV ANTIBODY (ROUTINE TESTING W REFLEX): HIV Screen 4th Generation wRfx: NONREACTIVE

## 2017-05-19 MED ORDER — FERROUS SULFATE DRIED ER 160 (50 FE) MG PO TBCR: 160.0000 mg | EXTENDED_RELEASE_TABLET | Freq: Two times a day (BID) | ORAL | Status: DC

## 2017-05-19 MED ORDER — METOPROLOL TARTRATE 25 MG PO TABS
25.0000 mg | ORAL_TABLET | Freq: Two times a day (BID) | ORAL | 0 refills | Status: DC
Start: 2017-05-19 — End: 2017-06-12

## 2017-05-19 MED ORDER — FUROSEMIDE 40 MG PO TABS: 40.0000 mg | ORAL_TABLET | Freq: Two times a day (BID) | ORAL | 0 refills | Status: DC

## 2017-05-19 MED ORDER — AMLODIPINE BESYLATE 10 MG PO TABS: 10.0000 mg | ORAL_TABLET | Freq: Every day | ORAL | 0 refills | Status: DC

## 2017-05-19 NOTE — Discharge Summary (Signed)
[facility] NAME: Janet Olson    MR#:  [id number]  DATE OF BIRTH:  06/12/1970  DATE OF ADMISSION:  05/17/2017 ADMITTING PHYSICIAN: Vaughan Basta, MD  DATE OF DISCHARGE: 05/19/2017  PRIMARY CARE PHYSICIAN: System, Pcp Not In    ADMISSION DIAGNOSIS:  Peripheral edema [R60.9] Acute renal failure (ARF) (HCC) [N17.9] Acute kidney injury ([place]) [N17.9]  DISCHARGE DIAGNOSIS:  Principal Problem:   Acute renal failure (ARF) ([place])   SECONDARY DIAGNOSIS:   Past Medical History:  Diagnosis Date  . Anemia   . Asthma   . Chronic kidney disease   . COPD (chronic obstructive pulmonary disease) ([place])   . GERD (gastroesophageal reflux disease)   . Ulcer     HOSPITAL COURSE:   47 year old female with history of COPD and chronic kidney disease who presented with lower extremities swelling and found to have creatinine above 4.   1. Acute on chronic kidney disease stage 3/4: Renal ultrasound shows medical renal disease without hydronephrosis. She was evaluated by nephrology. Her creatinine actually improved with IV Lasix. She will close follow-up with nephrology on Tuesday for repeat BMP. There is no urgent indication to start dialysis. Creatinine actually improved with IV Lasix 2. Acute diastolic dysfunction: Echocardiogram shows preserved ejection fraction with diastolic dysfunction stage I. She was diuresed with IV Lasix. Creatinine has improved with IV Lasix as well as lower extremity edema.  3. Elevated blood pressure with proteinuria:, No previous diagnosis of hypertension Continue metoprolol and Norvasc She will have close follow-up with nephrology  4. Mild intermittent asthma: Continue inhalers  5. Anemia of chronic kidney disease: Continue ferrous sulfate 6. Tobacco dependence: Patient is encouraged to quit smoking. Counseling was provided for 4 minutes  DISCHARGE CONDITIONS AND DIET:   Stable for discharge  on renal heart healthy diet  CONSULTS OBTAINED:  Treatment Team:  Alexis Goodell, MD Holley Raring, [place], MD  DRUG ALLERGIES:  No Known Allergies  DISCHARGE MEDICATIONS:   Current Discharge Medication List    START taking these medications   Details  amLODipine (NORVASC) 10 MG tablet Take 1 tablet (10 mg total) by mouth daily. Qty: 30 tablet, Refills: 0    metoprolol tartrate (LOPRESSOR) 25 MG tablet Take 1 tablet (25 mg total) by mouth 2 (two) times daily. Qty: 60 tablet, Refills: 0      CONTINUE these medications which have CHANGED   Details  ferrous sulfate (EQL SLOW RELEASE IRON) 160 (50 Fe) MG TBCR SR tablet Take 1 tablet (160 mg total) by mouth 2 (two) times daily.    furosemide (LASIX) 40 MG tablet Take 1 tablet (40 mg total) by mouth 2 (two) times daily. Qty: 60 tablet, Refills: 0      CONTINUE these medications which have NOT CHANGED   Details  albuterol (PROVENTIL) (2.5 MG/3ML) 0.083% nebulizer solution Take 2.5 mg by nebulization 3 (three) times daily.    ALBUTEROL IN Inhale 1-2 puffs into the lungs as needed.    fluticasone (FLONASE) 50 MCG/ACT nasal spray Place 1 spray into both nostrils daily. Qty: 16 g, Refills: 0    Multiple Vitamin (MULTIVITAMIN) tablet Take 1 tablet by mouth daily.    pantoprazole (PROTONIX) 40 MG tablet Take 1 tablet (40 mg total) by mouth daily. Qty: 30 tablet, Refills: 1    sucralfate (CARAFATE) 1 g tablet Take 1 tablet (1 g total) by mouth 4 (four) times daily. Qty: 120 tablet, Refills: 1    tiotropium (SPIRIVA)  18 MCG inhalation capsule Place 18 mcg into inhaler and inhale 2 (two) times daily.      STOP taking these medications     Ibuprofen-Diphenhydramine Cit (ADVIL PM PO)      guaiFENesin-codeine 100-10 MG/5ML syrup      HYDROcodone-homatropine (HYCODAN) 5-1.5 MG/5ML syrup      traMADol (ULTRAM) 50 MG tablet           Today   CHIEF COMPLAINT:  Patient doing well this morning. No acute events  Edema has  improved   VITAL SIGNS:  Blood pressure 138/81, pulse 78, temperature 98 F (36.7 C), temperature source Oral, resp. rate 18, height 5\' 6"  (1.676 m), weight 107.5 kg (237 lb 1.6 oz), last menstrual period 04/29/2017, SpO2 100 %.   REVIEW OF SYSTEMS:  Review of Systems  Constitutional: Negative.  Negative for chills, fever and malaise/fatigue.  HENT: Negative.  Negative for ear discharge, ear pain, hearing loss, nosebleeds and sore throat.   Eyes: Negative.  Negative for blurred vision and pain.  Respiratory: Negative.  Negative for cough, hemoptysis, shortness of breath and wheezing.   Cardiovascular: Negative.  Negative for chest pain, palpitations and leg swelling.  Gastrointestinal: Negative.  Negative for abdominal pain, blood in stool, diarrhea, nausea and vomiting.  Genitourinary: Negative.  Negative for dysuria.  Musculoskeletal: Negative.  Negative for back pain.  Skin: Negative.   Neurological: Negative for dizziness, tremors, speech change, focal weakness, seizures and headaches.  Endo/Heme/Allergies: Negative.  Does not bruise/bleed easily.  Psychiatric/Behavioral: Negative.  Negative for depression, hallucinations and suicidal ideas.     PHYSICAL EXAMINATION:  GENERAL:  47 y.o.-year-old patient lying in the bed with no acute distress.  NECK:  Supple, no jugular venous distention. No thyroid enlargement, no tenderness.  LUNGS: Normal breath sounds bilaterally, no wheezing, rales,rhonchi  No use of accessory muscles of respiration.  CARDIOVASCULAR: S1, S2 normal. No murmurs, rubs, or gallops.  ABDOMEN: Soft, non-tender, non-distended. Bowel sounds present. No organomegaly or mass.  EXTREMITIES: No pedal edema, cyanosis, or clubbing.  PSYCHIATRIC: The patient is alert and oriented x 3.  SKIN: No obvious rash, lesion, or ulcer.   DATA REVIEW:   CBC  Recent Labs Lab 05/18/17 0348  WBC 7.6  HGB 10.3*  HCT 31.3*  PLT 416    Chemistries   Recent Labs Lab  05/17/17 1436  05/19/17 0444  NA 139  < > 139  K 3.7  < > 3.7  CL 113*  < > 113*  CO2 20*  < > 22  GLUCOSE 134*  < > 98  BUN 49*  < > 46*  CREATININE 4.40*  < > 3.80*  CALCIUM 8.0*  < > 8.4*  AST 14*  --   --   ALT 15  --   --   ALKPHOS 92  --   --   BILITOT 0.4  --   --   < > = values in this interval not displayed.  Cardiac Enzymes  Recent Labs Lab 05/14/17 1010 05/17/17 1436  TROPONINI <0.03 <0.03    Microbiology Results  @[email or web address] @  RADIOLOGY:  Dg Chest 2 View  Result Date: 05/17/2017 CLINICAL DATA:  Fluid retention. Shortness of breath over the last 4-5 days. EXAM: CHEST  2 VIEW COMPARISON:  Two-view chest x-ray 05/14/2017. FINDINGS: Cardiac enlargement is stable. There is no edema or effusion to suggest failure. The lungs are clear. The visualized soft tissues and bony thorax are unremarkable. IMPRESSION: 1. Stable cardiomegaly without failure. 2.  No acute cardiopulmonary disease. Electronically Signed   By: San Morelle M.D.   On: 05/17/2017 18:37   US Renal  Result Date: 05/17/2017 CLINICAL DATA:  Acute renal failure EXAM: RENAL / URINARY TRACT ULTRASOUND COMPLETE COMPARISON:  03/21/2017 CT FINDINGS: Right Kidney: Length: 11.7 cm. Loss of cortical-medullary distinction with increased echogenicity of the right kidney. A 7 mm cyst is noted within the cortex of the right upper pole. No nephrolithiasis nor obstructive uropathy. Left Kidney: Length: 11 cm. Loss of cortical-medullary distinction with increased echogenicity of the left kidney. No focal mass or obstructive uropathy. No nephrolithiasis. Bladder: Appears normal for degree of bladder distention. Bilateral ureteral urinary jets are demonstrated within the bladder consistent with patent ureters. IMPRESSION: Echogenic kidneys bilaterally consistent with medical renal disease. Subcentimeter cyst in the upper pole of the right kidney measuring 7 mm. Patent bilateral ureters are demonstrated. Electronically  Signed   By: Ashley Royalty M.D.   On: 05/17/2017 20:46      Current Discharge Medication List    START taking these medications   Details  amLODipine (NORVASC) 10 MG tablet Take 1 tablet (10 mg total) by mouth daily. Qty: 30 tablet, Refills: 0    metoprolol tartrate (LOPRESSOR) 25 MG tablet Take 1 tablet (25 mg total) by mouth 2 (two) times daily. Qty: 60 tablet, Refills: 0      CONTINUE these medications which have CHANGED   Details  ferrous sulfate (EQL SLOW RELEASE IRON) 160 (50 Fe) MG TBCR SR tablet Take 1 tablet (160 mg total) by mouth 2 (two) times daily.    furosemide (LASIX) 40 MG tablet Take 1 tablet (40 mg total) by mouth 2 (two) times daily. Qty: 60 tablet, Refills: 0      CONTINUE these medications which have NOT CHANGED   Details  albuterol (PROVENTIL) (2.5 MG/3ML) 0.083% nebulizer solution Take 2.5 mg by nebulization 3 (three) times daily.    ALBUTEROL IN Inhale 1-2 puffs into the lungs as needed.    fluticasone (FLONASE) 50 MCG/ACT nasal spray Place 1 spray into both nostrils daily. Qty: 16 g, Refills: 0    Multiple Vitamin (MULTIVITAMIN) tablet Take 1 tablet by mouth daily.    pantoprazole (PROTONIX) 40 MG tablet Take 1 tablet (40 mg total) by mouth daily. Qty: 30 tablet, Refills: 1    sucralfate (CARAFATE) 1 g tablet Take 1 tablet (1 g total) by mouth 4 (four) times daily. Qty: 120 tablet, Refills: 1    tiotropium (SPIRIVA) 18 MCG inhalation capsule Place 18 mcg into inhaler and inhale 2 (two) times daily.      STOP taking these medications     Ibuprofen-Diphenhydramine Cit (ADVIL PM PO)      guaiFENesin-codeine 100-10 MG/5ML syrup      HYDROcodone-homatropine (HYCODAN) 5-1.5 MG/5ML syrup      traMADol (ULTRAM) 50 MG tablet            Management plans discussed with the patient and she is in agreement. Stable for discharge home  Patient should follow up with dr Holley Raring  CODE STATUS:     Code Status Orders        Start     Ordered    05/17/17 2043  Full code  Continuous     05/17/17 2042    Code Status History    Date Active Date Inactive Code Status Order ID Comments User Context   This patient has a current code status but no historical code status.  TOTAL TIME TAKING CARE OF THIS PATIENT: 37 minutes.    Note: This dictation was prepared with Dragon dictation along with smaller phrase technology. Any transcriptional errors that result from this process are unintentional.  Bita Cartwright M.D on 05/19/2017 at 8:47 AM  Between 7am to 6pm - Pager - [phone] After 6pm go to [email or web address] - password [email or web address]  Office  [phone]  CC: Primary care physician; System, Pcp Not In

## 2017-05-19 NOTE — Progress Notes (Signed)
Work excuse note written per dr.Mody's request

## 2017-05-19 NOTE — Progress Notes (Signed)
Patient discharging home. Instructions and prescriptions given to patient. Work note given to patient. Verbalized understanding. Family transporting patient home.

## 2017-05-19 NOTE — Progress Notes (Signed)
  May 19, 2017  Patient: Janet Olson  Date of Birth: 15-Aug-1970  Date of Visit: 05/17/2017    To Whom It May Concern:  [name] was seen and treated in our emergency department or urgent care center on 05/17/2017. [name] may return to work on Friday 05/24/17.   Sincerely,   Helen Hashimoto

## 2017-05-19 NOTE — Progress Notes (Signed)
[place], [place].   05/19/2017  Patient: Janet Olson   Date of Birth:  04-22-1970  Date of admission:  05/17/2017  Date of Discharge  05/19/2017    To Whom it May Concern:   Gredmarie Delange  may return to work on 05/24/17.  PHYSICAL ACTIVITY:  Full  If you have any questions or concerns, please don't hesitate to call.  Sincerely,   Nicholes Mango M.D Pager [phone] Office : [phone]   .

## 2017-05-19 NOTE — Progress Notes (Signed)
[facility]  ROUNDING NOTE   Subjective:  Patient doing better today. Edema has decreased. Creatinine currently 3.8.    Objective:  Vital signs in last 24 hours:  Temp:  [98 F (36.7 C)-98.1 F (36.7 C)] 98 F (36.7 C) (05/20 0801) Pulse Rate:  [78-91] 78 (05/20 0801) Resp:  [18-20] 18 (05/20 0801) BP: (136-150)/(72-86) 138/81 (05/20 0801) SpO2:  [93 %-100 %] 100 % (05/20 0801)  Weight change:  Filed Weights   05/17/17 1415 05/17/17 2052  Weight: 90.7 kg (200 lb) 107.5 kg (237 lb 1.6 oz)    Intake/Output: I/O last 3 completed shifts: In: 720 [P.O.:720] Out: -    Intake/Output this shift:  No intake/output data recorded.  Physical Exam: General: No acute distress  Head: Normocephalic, atraumatic. Moist oral mucosal membranes  Eyes: Anicteric  Neck: Supple, trachea midline  Lungs:  Clear to auscultation, normal effort  Heart: S1S2 no rubs  Abdomen:  Soft, nontender, bowel sounds present  Extremities: 1+ peripheral edema.  Neurologic: Awake, alert, following commands  Skin: No lesions       Basic Metabolic Panel:  Recent Labs Lab 05/14/17 1010 05/17/17 1436 05/18/17 0348 05/19/17 0444  NA 138 139 140 139  K 4.4 3.7 4.3 3.7  CL 112* 113* 113* 113*  CO2 20* 20* 21* 22  GLUCOSE 107* 134* 96 98  BUN 42* 49* 51* 46*  CREATININE 4.42* 4.40* 4.38* 3.80*  CALCIUM 8.3* 8.0* 8.3* 8.4*    Liver Function Tests:  Recent Labs Lab 05/17/17 1436  AST 14*  ALT 15  ALKPHOS 92  BILITOT 0.4  PROT 5.9*  ALBUMIN 2.3*   No results for input(s): LIPASE, AMYLASE in the last 168 hours. No results for input(s): AMMONIA in the last 168 hours.  CBC:  Recent Labs Lab 05/14/17 1010 05/17/17 1436 05/18/17 0348  WBC 6.3 5.4 7.6  NEUTROABS  --  3.4  --   HGB 9.9* 9.7* 10.3*  HCT 30.4* 29.3* 31.3*  MCV 88.8 88.7 88.5  PLT 371 372 416    Cardiac Enzymes:  Recent Labs Lab 05/14/17 1010 05/17/17 1436  TROPONINI <0.03 <0.03     BNP: Invalid input(s): POCBNP  CBG: No results for input(s): GLUCAP in the last 168 hours.  Microbiology: No results found for this or any previous visit.  Coagulation Studies: No results for input(s): LABPROT, INR in the last 72 hours.  Urinalysis:  Recent Labs  05/17/17 1435  COLORURINE YELLOW*  LABSPEC 1.015  PHURINE 5.0  GLUCOSEU 50*  HGBUR NEGATIVE  BILIRUBINUR NEGATIVE  KETONESUR NEGATIVE  PROTEINUR >=300*  NITRITE NEGATIVE  LEUKOCYTESUR NEGATIVE      Imaging: Dg Chest 2 View  Result Date: 05/17/2017 CLINICAL DATA:  Fluid retention. Shortness of breath over the last 4-5 days. EXAM: CHEST  2 VIEW COMPARISON:  Two-view chest x-ray 05/14/2017. FINDINGS: Cardiac enlargement is stable. There is no edema or effusion to suggest failure. The lungs are clear. The visualized soft tissues and bony thorax are unremarkable. IMPRESSION: 1. Stable cardiomegaly without failure. 2. No acute cardiopulmonary disease. Electronically Signed   By: San Morelle M.D.   On: 05/17/2017 18:37   US Renal  Result Date: 05/17/2017 CLINICAL DATA:  Acute renal failure EXAM: RENAL / URINARY TRACT ULTRASOUND COMPLETE COMPARISON:  03/21/2017 CT FINDINGS: Right Kidney: Length: 11.7 cm. Loss of cortical-medullary distinction with increased echogenicity of the right kidney. A 7 mm cyst is noted within the cortex of the right upper pole. No nephrolithiasis nor obstructive  uropathy. Left Kidney: Length: 11 cm. Loss of cortical-medullary distinction with increased echogenicity of the left kidney. No focal mass or obstructive uropathy. No nephrolithiasis. Bladder: Appears normal for degree of bladder distention. Bilateral ureteral urinary jets are demonstrated within the bladder consistent with patent ureters. IMPRESSION: Echogenic kidneys bilaterally consistent with medical renal disease. Subcentimeter cyst in the upper pole of the right kidney measuring 7 mm. Patent bilateral ureters are  demonstrated. Electronically Signed   By: Ashley Royalty M.D.   On: 05/17/2017 20:46     Medications:    . amLODipine  10 mg Oral Daily  . ferrous sulfate  325 mg Oral BID WC  . furosemide  80 mg Intravenous Daily  . heparin  5,000 Units Subcutaneous Q8H  . metoprolol tartrate  25 mg Oral BID  . multivitamin with minerals  1 tablet Oral Daily  . sucralfate  1 g Oral QID  . tiotropium  18 mcg Inhalation BID   albuterol, docusate sodium, fluticasone, pantoprazole  Assessment/ Plan:  47 y.o. female with a PMHx of Prior history of chronic kidney disease stage IV, hypertension, anemia, COPD, GERD who was admitted to [facility] on 05/17/2017 for evaluation of worsening lower extremity edema and renal function.   1.  Chronic kidney disease stage 5 with high probability of progression to ESRD. 2.  Anemia of CKD. 3.  Proteinuria. 4.  Hypertension. 5.  Lower extremity edema.   Plan:  The patient's lower extremity edema has significantly improved. Patient's case discussed in depth with the patient this a.m. Patient has high probability to progress to end-stage renal disease. Would not recommend renal biopsy at this time as we would most likely find scar given advanced renal dysfunction. We will need to follow-up her renal function closely. We have advised her to come to our office this Tuesday. We will also continue to follow her hemoglobin and consider therapy with Epogen if hemoglobin continues to drop. In addition she has decided upon peritoneal dialysis should she need renal placement therapy in the relative near future.     LOS: 2 Iyanah Demont 5/20/201810:19 AM

## 2017-05-20 ENCOUNTER — Other Ambulatory Visit
Admission: RE | Admit: 2017-05-20 | Discharge: 2017-05-20 | Disposition: A | Discharge status: Home / Self Care | Payer: Medicaid Other | Source: Ambulatory Visit | Attending: Nephrology | Admitting: Nephrology

## 2017-05-20 DIAGNOSIS — I10 Essential (primary) hypertension: Secondary | ICD-10-CM | POA: Diagnosis not present

## 2017-05-20 DIAGNOSIS — N185 Chronic kidney disease, stage 5: Secondary | ICD-10-CM | POA: Insufficient documentation

## 2017-05-20 LAB — PROTEIN ELECTROPHORESIS, SERUM
A/G Ratio: 0.8 (ref 0.7–1.7)
Albumin ELP: 2.4 g/dL — ABNORMAL LOW (ref 2.9–4.4)
Alpha-1-Globulin: 0.2 g/dL (ref 0.0–0.4)
Alpha-2-Globulin: 0.6 g/dL (ref 0.4–1.0)
BETA GLOBULIN: 1.1 g/dL (ref 0.7–1.3)
GAMMA GLOBULIN: 1 g/dL (ref 0.4–1.8)
Globulin, Total: 2.9 g/dL (ref 2.2–3.9)
M-SPIKE, %: 0.2 g/dL — AB
Total Protein ELP: 5.3 g/dL — ABNORMAL LOW (ref 6.0–8.5)

## 2017-05-20 LAB — COMPREHENSIVE METABOLIC PANEL
ALBUMIN: 2.5 g/dL — AB (ref 3.5–5.0)
ALT: 17 U/L (ref 14–54)
AST: 14 U/L — AB (ref 15–41)
Alkaline Phosphatase: 94 U/L (ref 38–126)
Anion gap: 6 (ref 5–15)
BILIRUBIN TOTAL: 0.6 mg/dL (ref 0.3–1.2)
BUN: 53 mg/dL — ABNORMAL HIGH (ref 6–20)
CHLORIDE: 108 mmol/L (ref 101–111)
CO2: 21 mmol/L — AB (ref 22–32)
Calcium: 8.5 mg/dL — ABNORMAL LOW (ref 8.9–10.3)
Creatinine, Ser: 4.44 mg/dL — ABNORMAL HIGH (ref 0.44–1.00)
GFR calc Af Amer: 12 mL/min — ABNORMAL LOW (ref >60)
GFR calc non Af Amer: 11 mL/min — ABNORMAL LOW (ref >60)
GLUCOSE: 101 mg/dL — AB (ref 65–99)
POTASSIUM: 4 mmol/L (ref 3.5–5.1)
SODIUM: 135 mmol/L (ref 135–145)
TOTAL PROTEIN: 6.6 g/dL (ref 6.5–8.1)

## 2017-05-20 LAB — HEMOGLOBIN A1C: Mean Plasma Glucose: <74 mg/dL

## 2017-05-20 LAB — PROTEIN / CREATININE RATIO, URINE
Creatinine, Urine: 81 mg/dL
PROTEIN CREATININE RATIO: 1.15 mg/mg{creat} — AB (ref 0.00–0.15)
TOTAL PROTEIN, URINE: 93 mg/dL

## 2017-05-20 LAB — PHOSPHORUS: Phosphorus: 3.8 mg/dL (ref 2.5–4.6)

## 2017-05-21 LAB — PARATHYROID HORMONE, INTACT (NO CA): PTH: 107 pg/mL — ABNORMAL HIGH (ref 15–65)

## 2017-05-23 ENCOUNTER — Ambulatory Visit: Payer: Self-pay | Admitting: Adult Health Nurse Practitioner

## 2017-05-23 VITALS — BP 115/74 | HR 88 | Wt 230.8 lb

## 2017-05-23 DIAGNOSIS — I10 Essential (primary) hypertension: Secondary | ICD-10-CM

## 2017-05-23 DIAGNOSIS — N179 Acute kidney failure, unspecified: Secondary | ICD-10-CM

## 2017-05-23 DIAGNOSIS — I129 Hypertensive chronic kidney disease with stage 1 through stage 4 chronic kidney disease, or unspecified chronic kidney disease: Secondary | ICD-10-CM | POA: Insufficient documentation

## 2017-05-23 NOTE — Progress Notes (Signed)
Patient: Janet Olson Female    DOB: [birth year]   47 y.o.   MRN: [id number] Visit Date: 05/23/2017  Today's Provider: Staci Acosta, NP   No chief complaint on file.  Subjective:    HPI   Recently hospitalized on 5.18-5.20 2018 for AKI.  Creatinine was improved with Lasix.  She is being followed by Dr. Holley Raring.  Current GFR 12.  Pt states that she has a consult for vein and vascular to obtain a dialysis catheter.  She is hopeful to get peritoneal dialysis so she can do it at home.   Echo showed preserved EF with diastolic dysfunction stage 1.   HTN:  Newly diagnosed in the hospital. Had proteinuria.  Taking medications as directed.  Taking Lasix twice daily.  Pt states that she is still having BLE edema. Pt states she is still urinating.  Denies HA, dizziness.    COPD:   Current tobacco use- smoking a ppd.  Using Spiriva as directed.  Has not had to use rescue inhaler since hospitalization.     No Known Allergies Previous Medications   ALBUTEROL (PROVENTIL) (2.5 MG/3ML) 0.083% NEBULIZER SOLUTION    Take 2.5 mg by nebulization 3 (three) times daily.   ALBUTEROL IN    Inhale 1-2 puffs into the lungs as needed.   AMLODIPINE (NORVASC) 10 MG TABLET    Take 1 tablet (10 mg total) by mouth daily.   FERROUS SULFATE (EQL SLOW RELEASE IRON) 160 (50 FE) MG TBCR SR TABLET    Take 1 tablet (160 mg total) by mouth 2 (two) times daily.   FLUTICASONE (FLONASE) 50 MCG/ACT NASAL SPRAY    Place 1 spray into both nostrils daily.   FUROSEMIDE (LASIX) 40 MG TABLET    Take 1 tablet (40 mg total) by mouth 2 (two) times daily.   METOPROLOL TARTRATE (LOPRESSOR) 25 MG TABLET    Take 1 tablet (25 mg total) by mouth 2 (two) times daily.   MULTIPLE VITAMIN (MULTIVITAMIN) TABLET    Take 1 tablet by mouth daily.   PANTOPRAZOLE (PROTONIX) 40 MG TABLET    Take 1 tablet (40 mg total) by mouth daily.   SUCRALFATE (CARAFATE) 1 G TABLET    Take 1 tablet (1 g total) by mouth 4 (four) times daily.    TIOTROPIUM (SPIRIVA) 18 MCG INHALATION CAPSULE    Place 18 mcg into inhaler and inhale 2 (two) times daily.    Review of Systems  All other systems reviewed and are negative.   Social History  Substance Use Topics  . Smoking status: Current Every Day Smoker    Packs/day: 0.50    Years: 30.00    Types: Cigarettes  . Smokeless tobacco: Never Used  . Alcohol use Yes   Objective:   BP 115/74 (BP Location: Left Arm, Patient Position: Sitting, Cuff Size: Large)   Pulse 88   Wt 230 lb 12.8 oz (104.7 kg)   LMP 04/29/2017   BMI 37.25 kg/m   Physical Exam  Constitutional: She is oriented to person, place, and time. She appears well-developed and well-nourished.  HENT:  Head: Normocephalic and atraumatic.  Eyes: Pupils are equal, round, and reactive to light.  Neck: Normal range of motion. Neck supple.  Cardiovascular: Normal rate, regular rhythm, normal heart sounds and intact distal pulses.   BLE 1+ edema.   Pulmonary/Chest: Effort normal and breath sounds normal.  Abdominal: Soft. Bowel sounds are normal.  Neurological: She is alert and oriented to person, place, and time.  Skin: Skin is warm and dry.  Vitals reviewed.       Assessment & Plan:      HTN:  Controlled.  Goal BP <140/90.  Continue current medication regimen.  Encourage low salt diet and exercise. .  Monitor for BLE edema or significant weight gain, discussed daily weights  Discussed compression hose and elevation of feet when sitting.  Monitor fluid intake and urinary output.   FU with Dr. Holley Raring on June 11 as scheduled.  Keep appointment at vein and vascular on June 8.   COPD:  Continue current regimen.  Encouraged smoking cessation.     FU in 4 weeks to ensure coordinated care.   Staci Acosta, NP   [facility]

## 2017-06-07 ENCOUNTER — Ambulatory Visit (INDEPENDENT_AMBULATORY_CARE_PROVIDER_SITE_OTHER): Payer: Medicaid Other | Admitting: Vascular Surgery

## 2017-06-07 ENCOUNTER — Encounter (INDEPENDENT_AMBULATORY_CARE_PROVIDER_SITE_OTHER): Payer: Self-pay | Admitting: Vascular Surgery

## 2017-06-07 VITALS — BP 110/77 | HR 81 | Resp 16 | Ht 66.0 in | Wt 230.0 lb

## 2017-06-07 DIAGNOSIS — I10 Essential (primary) hypertension: Secondary | ICD-10-CM

## 2017-06-07 DIAGNOSIS — N189 Chronic kidney disease, unspecified: Secondary | ICD-10-CM

## 2017-06-07 DIAGNOSIS — N185 Chronic kidney disease, stage 5: Secondary | ICD-10-CM | POA: Diagnosis not present

## 2017-06-07 DIAGNOSIS — D631 Anemia in chronic kidney disease: Secondary | ICD-10-CM | POA: Diagnosis not present

## 2017-06-07 NOTE — Assessment & Plan Note (Signed)
The patient has stage V chronic kidney disease nearing dialysis dependence. We had a long discussion today regarding options for treatment and the differences between hemodialysis and peritoneal dialysis. We discussed the procedure peritoneal dialysis catheter placement and the attendant risks and benefits including infection, poor function from scar tissue, and poor clearances. The patient is in addition proceeding with peritoneal dialysis catheter placement and this will be scheduled at her convenience.

## 2017-06-07 NOTE — Assessment & Plan Note (Signed)
Hgb was 10. No need for transfusion at this point.

## 2017-06-07 NOTE — Progress Notes (Signed)
Patient ID: Janet Olson, female   DOB: 11-27-1970, 47 y.o.   MRN: [id number]  Chief Complaint  Patient presents with  . New Patient (Initial Visit)    HPI Janet Olson is a 47 y.o. female.  I am asked to see the patient by Dr. Holley Raring for evaluation of PD catheter placement for permanent dialysis access.  The patient reports Fatigue and lethargy that have been worsening over several months. She has been out of work for about 6 weeks secondary to inability to stand and work a normal day. Prior to this, she had no knowledge of chronic kidney disease. She was found to have quite severe chronic kidney disease with estimated glomerular filtration rates between 12 and 15 consistent with stage V chronic kidney disease. She reports no previous diagnosis of renal insufficiency. She really does not have a lot of lower extremity swelling. She has no fever or chills. Her previous abdominal surgeries include only C-sections. She is interested in having peritoneal dialysis as her long-term dialysis access option and has been referred to discuss the placement of the catheter.   Past Medical History:  Diagnosis Date  . Anemia   . Asthma   . Chronic kidney disease   . COPD (chronic obstructive pulmonary disease) ([place])   . GERD (gastroesophageal reflux disease)   . Ulcer     Past Surgical History:  Procedure Laterality Date  . CESAREAN SECTION     x 4  . excision of lymph nodes Bilateral 2014   bilateral under arms.    Family History  Problem Relation Age of Onset  . Diabetes Mother   No bleeding disorders, clotting disorders, or aneurysms  Social History Social History  Substance Use Topics  . Smoking status: Current Every Day Smoker    Packs/day: 0.50    Years: 30.00    Types: Cigarettes  . Smokeless tobacco: Never Used  . Alcohol use Yes   Works in the healthcare field  No Known Allergies  Current Outpatient Prescriptions  Medication Sig Dispense Refill  .  albuterol (PROVENTIL) (2.5 MG/3ML) 0.083% nebulizer solution Take 2.5 mg by nebulization 3 (three) times daily.    . ALBUTEROL IN Inhale 1-2 puffs into the lungs as needed.    Marland Kitchen amLODipine (NORVASC) 10 MG tablet Take 1 tablet (10 mg total) by mouth daily. 30 tablet 0  . ferrous sulfate (EQL SLOW RELEASE IRON) 160 (50 Fe) MG TBCR SR tablet Take 1 tablet (160 mg total) by mouth 2 (two) times daily.    . fluticasone (FLONASE) 50 MCG/ACT nasal spray Place 1 spray into both nostrils daily. 16 g 0  . furosemide (LASIX) 40 MG tablet Take 1 tablet (40 mg total) by mouth 2 (two) times daily. 60 tablet 0  . metoprolol tartrate (LOPRESSOR) 25 MG tablet Take 1 tablet (25 mg total) by mouth 2 (two) times daily. 60 tablet 0  . Multiple Vitamin (MULTIVITAMIN) tablet Take 1 tablet by mouth daily.    . sucralfate (CARAFATE) 1 g tablet Take 1 tablet (1 g total) by mouth 4 (four) times daily. 120 tablet 1  . tiotropium (SPIRIVA) 18 MCG inhalation capsule Place 18 mcg into inhaler and inhale 2 (two) times daily.     No current facility-administered medications for this visit.       REVIEW OF SYSTEMS (Negative unless checked)  Constitutional: '[]' Weight loss  '[]' Fever  '[]' Chills Cardiac: '[]' Chest pain   '[]' Chest pressure   '[]' Palpitations   '[]' Shortness of breath  when laying flat   '[]' Shortness of breath at rest   '[x]' Shortness of breath with exertion. Vascular:  '[]' Pain in legs with walking   '[]' Pain in legs at rest   '[]' Pain in legs when laying flat   '[]' Claudication   '[]' Pain in feet when walking  '[]' Pain in feet at rest  '[]' Pain in feet when laying flat   '[]' History of DVT   '[]' Phlebitis   '[]' Swelling in legs   '[]' Varicose veins   '[]' Non-healing ulcers Pulmonary:   '[]' Uses home oxygen   '[]' Productive cough   '[]' Hemoptysis   '[]' Wheeze  '[]' COPD   '[x]' Asthma Neurologic:  '[]' Dizziness  '[]' Blackouts   '[]' Seizures   '[]' History of stroke   '[]' History of TIA  '[]' Aphasia   '[]' Temporary blindness   '[]' Dysphagia   '[]' Weakness or numbness in arms   '[]' Weakness  or numbness in legs Musculoskeletal:  '[]' Arthritis   '[]' Joint swelling   '[]' Joint pain   '[]' Low back pain Hematologic:  '[]' Easy bruising  '[]' Easy bleeding   '[]' Hypercoagulable state   '[x]' Anemic  '[]' Hepatitis Gastrointestinal:  '[]' Blood in stool   '[]' Vomiting blood  '[]' Gastroesophageal reflux/heartburn   '[]' Abdominal pain Genitourinary:  '[x]' Chronic kidney disease   '[]' Difficult urination  '[]' Frequent urination  '[]' Burning with urination   '[]' Hematuria Skin:  '[]' Rashes   '[]' Ulcers   '[]' Wounds Psychological:  '[]' History of anxiety   '[]'  History of major depression.    Physical Exam BP 110/77   Pulse 81   Resp 16   Ht '5\' 6"'  (1.676 m)   Wt 104.3 kg (230 lb)   BMI 37.12 kg/m  Gen:  WD/WN, NAD. Obese Head: [place]/AT, No temporalis wasting.  Ear/Nose/Throat: Hearing grossly intact, nares w/o erythema or drainage, oropharynx w/o Erythema/Exudate Eyes: Conjunctiva clear, sclera non-icteric  Neck: trachea midline.  No JVD.  Pulmonary:  Good air movement, No use of accessory muscles, respirations not labored Cardiac: RRR Vascular:  Vessel Right Left  Radial Palpable Palpable                                   Gastrointestinal: soft, non-tender/non-distended. No guarding/reflex. C-section scars Musculoskeletal: M/S 5/5 throughout.  Extremities without ischemic changes.  No deformity or atrophy. Trace lower extremity edema. Neurologic: Sensation grossly intact in extremities.  Symmetrical.  Speech is fluent. Motor exam as listed above. Psychiatric: Judgment intact, Mood & affect appropriate for pt's clinical situation. Dermatologic: No rashes or ulcers noted.  No cellulitis or open wounds.  Radiology Dg Chest 2 View  Result Date: 05/17/2017 CLINICAL DATA:  Fluid retention. Shortness of breath over the last 4-5 days. EXAM: CHEST  2 VIEW COMPARISON:  Two-view chest x-ray 05/14/2017. FINDINGS: Cardiac enlargement is stable. There is no edema or effusion to suggest failure. The lungs are clear. The visualized soft  tissues and bony thorax are unremarkable. IMPRESSION: 1. Stable cardiomegaly without failure. 2. No acute cardiopulmonary disease. Electronically Signed   By: San Morelle M.D.   On: 05/17/2017 18:37   Dg Chest 2 View  Result Date: 05/14/2017 CLINICAL DATA:  Shortness of breath for the last few weeks. EXAM: CHEST  2 VIEW COMPARISON:  11/26/2015 FINDINGS: Cardiomegaly and mild aortic tortuosity, stable. There is no edema, consolidation, effusion, or pneumothorax. No osseous findings. IMPRESSION: 1. No evidence of active disease. 2. Chronic cardiomegaly. Electronically Signed   By: Monte Fantasia M.D.   On: 05/14/2017 10:30   US Renal  Result Date: 05/17/2017 CLINICAL DATA:  Acute renal failure  EXAM: RENAL / URINARY TRACT ULTRASOUND COMPLETE COMPARISON:  03/21/2017 CT FINDINGS: Right Kidney: Length: 11.7 cm. Loss of cortical-medullary distinction with increased echogenicity of the right kidney. A 7 mm cyst is noted within the cortex of the right upper pole. No nephrolithiasis nor obstructive uropathy. Left Kidney: Length: 11 cm. Loss of cortical-medullary distinction with increased echogenicity of the left kidney. No focal mass or obstructive uropathy. No nephrolithiasis. Bladder: Appears normal for degree of bladder distention. Bilateral ureteral urinary jets are demonstrated within the bladder consistent with patent ureters. IMPRESSION: Echogenic kidneys bilaterally consistent with medical renal disease. Subcentimeter cyst in the upper pole of the right kidney measuring 7 mm. Patent bilateral ureters are demonstrated. Electronically Signed   By: Ashley Royalty M.D.   On: 05/17/2017 20:46    Labs Recent Results (from the past 2160 hour(s))  Lipase, blood     Status: None   Collection Time: 03/21/17  9:27 AM  Result Value Ref Range   Lipase 49 11 - 51 U/L  Comprehensive metabolic panel     Status: Abnormal   Collection Time: 03/21/17  9:27 AM  Result Value Ref Range   Sodium 137 135 - 145  mmol/L   Potassium 4.3 3.5 - 5.1 mmol/L   Chloride 112 (H) 101 - 111 mmol/L   CO2 21 (L) 22 - 32 mmol/L   Glucose, Bld 130 (H) 65 - 99 mg/dL   BUN 32 (H) 6 - 20 mg/dL   Creatinine, Ser 2.74 (H) 0.44 - 1.00 mg/dL   Calcium 8.6 (L) 8.9 - 10.3 mg/dL   Total Protein 6.8 6.5 - 8.1 g/dL   Albumin 2.8 (L) 3.5 - 5.0 g/dL   AST 16 15 - 41 U/L   ALT 17 14 - 54 U/L   Alkaline Phosphatase 81 38 - 126 U/L   Total Bilirubin 0.2 (L) 0.3 - 1.2 mg/dL   GFR calc non Af Amer 19 (L) >60 mL/min   GFR calc Af Amer 22 (L) >60 mL/min    Comment: (NOTE) The eGFR has been calculated using the CKD EPI equation. This calculation has not been validated in all clinical situations. eGFR's persistently <60 mL/min signify possible Chronic Kidney Disease.    Anion gap 4 (L) 5 - 15  CBC     Status: Abnormal   Collection Time: 03/21/17  9:27 AM  Result Value Ref Range   WBC 7.8 3.6 - 11.0 K/uL   RBC 3.07 (L) 3.80 - 5.20 MIL/uL   Hemoglobin 8.8 (L) 12.0 - 16.0 g/dL   HCT 27.8 (L) 35.0 - 47.0 %   MCV 90.6 80.0 - 100.0 fL   MCH 28.8 26.0 - 34.0 pg   MCHC 31.7 (L) 32.0 - 36.0 g/dL   RDW 17.7 (H) 11.5 - 14.5 %   Platelets 431 150 - 440 K/uL  Urinalysis, Complete w Microscopic     Status: Abnormal   Collection Time: 03/21/17  9:27 AM  Result Value Ref Range   Color, Urine YELLOW (A) YELLOW   APPearance CLEAR (A) CLEAR   Specific Gravity, Urine 1.016 1.005 - 1.030   pH 6.0 5.0 - 8.0   Glucose, UA 50 (A) NEGATIVE mg/dL   Hgb urine dipstick NEGATIVE NEGATIVE   Bilirubin Urine NEGATIVE NEGATIVE   Ketones, ur NEGATIVE NEGATIVE mg/dL   Protein, ur >=300 (A) NEGATIVE mg/dL   Nitrite NEGATIVE NEGATIVE   Leukocytes, UA NEGATIVE NEGATIVE   RBC / HPF 0-5 0 - 5 RBC/hpf   WBC, UA  0-5 0 - 5 WBC/hpf   Bacteria, UA NONE SEEN NONE SEEN   Squamous Epithelial / LPF 0-5 (A) NONE SEEN  Pregnancy, urine POC     Status: None   Collection Time: 03/21/17  9:39 AM  Result Value Ref Range   Preg Test, Ur NEGATIVE NEGATIVE     Comment:        THE SENSITIVITY OF THIS METHODOLOGY IS >24 mIU/mL   Basic metabolic panel     Status: Abnormal   Collection Time: 05/14/17 10:10 AM  Result Value Ref Range   Sodium 138 135 - 145 mmol/L   Potassium 4.4 3.5 - 5.1 mmol/L   Chloride 112 (H) 101 - 111 mmol/L   CO2 20 (L) 22 - 32 mmol/L   Glucose, Bld 107 (H) 65 - 99 mg/dL   BUN 42 (H) 6 - 20 mg/dL   Creatinine, Ser 4.42 (H) 0.44 - 1.00 mg/dL   Calcium 8.3 (L) 8.9 - 10.3 mg/dL   GFR calc non Af Amer 11 (L) >60 mL/min   GFR calc Af Amer 13 (L) >60 mL/min    Comment: (NOTE) The eGFR has been calculated using the CKD EPI equation. This calculation has not been validated in all clinical situations. eGFR's persistently <60 mL/min signify possible Chronic Kidney Disease.    Anion gap 6 5 - 15  CBC     Status: Abnormal   Collection Time: 05/14/17 10:10 AM  Result Value Ref Range   WBC 6.3 3.6 - 11.0 K/uL   RBC 3.43 (L) 3.80 - 5.20 MIL/uL   Hemoglobin 9.9 (L) 12.0 - 16.0 g/dL   HCT 30.4 (L) 35.0 - 47.0 %   MCV 88.8 80.0 - 100.0 fL   MCH 28.7 26.0 - 34.0 pg   MCHC 32.4 32.0 - 36.0 g/dL   RDW 15.2 (H) 11.5 - 14.5 %   Platelets 371 150 - 440 K/uL  Troponin I     Status: None   Collection Time: 05/14/17 10:10 AM  Result Value Ref Range   Troponin I <0.03 <0.03 ng/mL  Brain natriuretic peptide     Status: Abnormal   Collection Time: 05/14/17 10:10 AM  Result Value Ref Range   B Natriuretic Peptide 104.0 (H) 0.0 - 100.0 pg/mL  Urinalysis, Complete w Microscopic     Status: Abnormal   Collection Time: 05/17/17  2:35 PM  Result Value Ref Range   Color, Urine YELLOW (A) YELLOW   APPearance HAZY (A) CLEAR   Specific Gravity, Urine 1.015 1.005 - 1.030   pH 5.0 5.0 - 8.0   Glucose, UA 50 (A) NEGATIVE mg/dL   Hgb urine dipstick NEGATIVE NEGATIVE   Bilirubin Urine NEGATIVE NEGATIVE   Ketones, ur NEGATIVE NEGATIVE mg/dL   Protein, ur >=300 (A) NEGATIVE mg/dL   Nitrite NEGATIVE NEGATIVE   Leukocytes, UA NEGATIVE NEGATIVE     RBC / HPF 0-5 0 - 5 RBC/hpf   WBC, UA 0-5 0 - 5 WBC/hpf   Bacteria, UA NONE SEEN NONE SEEN   Squamous Epithelial / LPF 0-5 (A) NONE SEEN   Mucous PRESENT   CBC with Differential     Status: Abnormal   Collection Time: 05/17/17  2:36 PM  Result Value Ref Range   WBC 5.4 3.6 - 11.0 K/uL   RBC 3.31 (L) 3.80 - 5.20 MIL/uL   Hemoglobin 9.7 (L) 12.0 - 16.0 g/dL   HCT 29.3 (L) 35.0 - 47.0 %   MCV 88.7 80.0 - 100.0 fL  MCH 29.3 26.0 - 34.0 pg   MCHC 33.1 32.0 - 36.0 g/dL   RDW 14.9 (H) 11.5 - 14.5 %   Platelets 372 150 - 440 K/uL   Neutrophils Relative % 63 %   Neutro Abs 3.4 1.4 - 6.5 K/uL   Lymphocytes Relative 26 %   Lymphs Abs 1.4 1.0 - 3.6 K/uL   Monocytes Relative 7 %   Monocytes Absolute 0.4 0.2 - 0.9 K/uL   Eosinophils Relative 3 %   Eosinophils Absolute 0.1 0 - 0.7 K/uL   Basophils Relative 1 %   Basophils Absolute 0.0 0 - 0.1 K/uL  Comprehensive metabolic panel     Status: Abnormal   Collection Time: 05/17/17  2:36 PM  Result Value Ref Range   Sodium 139 135 - 145 mmol/L   Potassium 3.7 3.5 - 5.1 mmol/L   Chloride 113 (H) 101 - 111 mmol/L   CO2 20 (L) 22 - 32 mmol/L   Glucose, Bld 134 (H) 65 - 99 mg/dL   BUN 49 (H) 6 - 20 mg/dL   Creatinine, Ser 4.40 (H) 0.44 - 1.00 mg/dL   Calcium 8.0 (L) 8.9 - 10.3 mg/dL   Total Protein 5.9 (L) 6.5 - 8.1 g/dL   Albumin 2.3 (L) 3.5 - 5.0 g/dL   AST 14 (L) 15 - 41 U/L   ALT 15 14 - 54 U/L   Alkaline Phosphatase 92 38 - 126 U/L   Total Bilirubin 0.4 0.3 - 1.2 mg/dL   GFR calc non Af Amer 11 (L) >60 mL/min   GFR calc Af Amer 13 (L) >60 mL/min    Comment: (NOTE) The eGFR has been calculated using the CKD EPI equation. This calculation has not been validated in all clinical situations. eGFR's persistently <60 mL/min signify possible Chronic Kidney Disease.    Anion gap 6 5 - 15  Brain natriuretic peptide     Status: None   Collection Time: 05/17/17  2:36 PM  Result Value Ref Range   B Natriuretic Peptide 75.0 0.0 - 100.0  pg/mL  Troponin I     Status: None   Collection Time: 05/17/17  2:36 PM  Result Value Ref Range   Troponin I <0.03 <0.03 ng/mL  hCG, quantitative, pregnancy     Status: None   Collection Time: 05/17/17  2:36 PM  Result Value Ref Range   hCG, Beta Chain, Quant, S 1 <5 mIU/mL    Comment:          GEST. AGE      CONC.  (mIU/mL)   <=1 WEEK        5 - 50     2 WEEKS       50 - 500     3 WEEKS       100 - 10,000     4 WEEKS     1,000 - 30,000     5 WEEKS     3,500 - 115,000   6-8 WEEKS     12,000 - 270,000    12 WEEKS     15,000 - 220,000        FEMALE AND NON-PREGNANT FEMALE:     LESS THAN 5 mIU/mL   Lipid panel     Status: Abnormal   Collection Time: 05/17/17  2:36 PM  Result Value Ref Range   Cholesterol 198 0 - 200 mg/dL   Triglycerides 197 (H) <150 mg/dL   HDL 63 >40 mg/dL   Total CHOL/HDL Ratio  3.1 RATIO   VLDL 39 0 - 40 mg/dL   LDL Cholesterol 96 0 - 99 mg/dL    Comment:        Total Cholesterol/HDL:CHD Risk Coronary Heart Disease Risk Table                     Men   Women  1/2 Average Risk   3.4   3.3  Average Risk       5.0   4.4  2 X Average Risk   9.6   7.1  3 X Average Risk  23.4   11.0        Use the calculated Patient Ratio above and the CHD Risk Table to determine the patient's CHD Risk.        ATP III CLASSIFICATION (LDL):  <100     mg/dL   Optimal  100-129  mg/dL   Near or Above                    Optimal  130-159  mg/dL   Borderline  160-189  mg/dL   High  >190     mg/dL   Very High   Protein electrophoresis, serum     Status: Abnormal   Collection Time: 05/17/17  8:49 PM  Result Value Ref Range   Total Protein ELP 5.3 (L) 6.0 - 8.5 g/dL   Albumin ELP 2.4 (L) 2.9 - 4.4 g/dL   Alpha-1-Globulin 0.2 0.0 - 0.4 g/dL   Alpha-2-Globulin 0.6 0.4 - 1.0 g/dL   Beta Globulin 1.1 0.7 - 1.3 g/dL   Gamma Globulin 1.0 0.4 - 1.8 g/dL   M-Spike, % 0.2 (H) Not Observed g/dL   SPE Interp. Comment     Comment: (NOTE) The SPE pattern demonstrates a single peak  (M-spike) in the gamma region which may represent monoclonal protein. This peak may also be caused by circulating immune complexes, cryoglobulins, C-reactive protein, fibrinogen or hemolysis.  If clinically indicated, the presence of a monoclonal gammopathy may be confirmed by immuno- fixation, as well as an evaluation of the urine for the presence of Bence-Jones protein. Performed At: [facility] [place], [place] [id number] Lindon Romp MD [id number]    Comment Comment     Comment: (NOTE) Protein electrophoresis scan will follow via computer, mail, or courier delivery.    GLOBULIN, TOTAL 2.9 2.2 - 3.9 g/dL   A/G Ratio 0.8 0.7 - 1.7  Hemoglobin A1c     Status: Abnormal   Collection Time: 05/17/17  8:49 PM  Result Value Ref Range   Hgb A1c MFr Bld <4.2 (L) 4.8 - 5.6 %    Comment: (NOTE) **Verified by repeat analysis**         Pre-diabetes: 5.7 - 6.4         Diabetes: >6.4         Glycemic control for adults with diabetes: <7.0    Mean Plasma Glucose <74 mg/dL    Comment: (NOTE) Performed At: [facility] [place], [place] [id number] Lindon Romp MD [id number]   HIV antibody (Routine Testing)     Status: None   Collection Time: 05/17/17  8:49 PM  Result Value Ref Range   HIV Screen 4th Generation wRfx Non Reactive Non Reactive    Comment: (NOTE) Performed At: [facility] [address] [place], [place] [id number] Lindon Romp MD [id number]   Basic metabolic panel     Status:  Abnormal   Collection Time: 05/18/17  3:48 AM  Result Value Ref Range   Sodium 140 135 - 145 mmol/L   Potassium 4.3 3.5 - 5.1 mmol/L   Chloride 113 (H) 101 - 111 mmol/L   CO2 21 (L) 22 - 32 mmol/L   Glucose, Bld 96 65 - 99 mg/dL   BUN 51 (H) 6 - 20 mg/dL   Creatinine, Ser 4.38 (H) 0.44 - 1.00 mg/dL   Calcium 8.3 (L) 8.9 - 10.3 mg/dL   GFR calc non Af Amer 11 (L) >60 mL/min   GFR calc Af Amer 13 (L) >60 mL/min     Comment: (NOTE) The eGFR has been calculated using the CKD EPI equation. This calculation has not been validated in all clinical situations. eGFR's persistently <60 mL/min signify possible Chronic Kidney Disease.    Anion gap 6 5 - 15  CBC     Status: Abnormal   Collection Time: 05/18/17  3:48 AM  Result Value Ref Range   WBC 7.6 3.6 - 11.0 K/uL   RBC 3.53 (L) 3.80 - 5.20 MIL/uL   Hemoglobin 10.3 (L) 12.0 - 16.0 g/dL   HCT 31.3 (L) 35.0 - 47.0 %   MCV 88.5 80.0 - 100.0 fL   MCH 29.0 26.0 - 34.0 pg   MCHC 32.8 32.0 - 36.0 g/dL   RDW 15.1 (H) 11.5 - 14.5 %   Platelets 416 150 - 440 K/uL  ECHOCARDIOGRAM COMPLETE     Status: None   Collection Time: 05/18/17 10:22 AM  Result Value Ref Range   Weight 3,793.6 oz   Height 66 in   BP 142/96 mmHg  Basic metabolic panel     Status: Abnormal   Collection Time: 05/19/17  4:44 AM  Result Value Ref Range   Sodium 139 135 - 145 mmol/L   Potassium 3.7 3.5 - 5.1 mmol/L   Chloride 113 (H) 101 - 111 mmol/L   CO2 22 22 - 32 mmol/L   Glucose, Bld 98 65 - 99 mg/dL   BUN 46 (H) 6 - 20 mg/dL   Creatinine, Ser 3.80 (H) 0.44 - 1.00 mg/dL   Calcium 8.4 (L) 8.9 - 10.3 mg/dL   GFR calc non Af Amer 13 (L) >60 mL/min   GFR calc Af Amer 15 (L) >60 mL/min    Comment: (NOTE) The eGFR has been calculated using the CKD EPI equation. This calculation has not been validated in all clinical situations. eGFR's persistently <60 mL/min signify possible Chronic Kidney Disease.    Anion gap 4 (L) 5 - 15  Protein / creatinine ratio, urine     Status: Abnormal   Collection Time: 05/20/17 10:51 AM  Result Value Ref Range   Creatinine, Urine 81 mg/dL   Total Protein, Urine 93 mg/dL    Comment: RESULT CONFIRMED BY MANUAL DILUTION NO NORMAL RANGE ESTABLISHED FOR THIS TEST    Protein Creatinine Ratio 1.15 (H) 0.00 - 0.15 mg/mg[Cre]  Comprehensive metabolic panel     Status: Abnormal   Collection Time: 05/20/17 10:55 AM  Result Value Ref Range   Sodium 135 135 -  145 mmol/L   Potassium 4.0 3.5 - 5.1 mmol/L   Chloride 108 101 - 111 mmol/L   CO2 21 (L) 22 - 32 mmol/L   Glucose, Bld 101 (H) 65 - 99 mg/dL   BUN 53 (H) 6 - 20 mg/dL   Creatinine, Ser 4.44 (H) 0.44 - 1.00 mg/dL   Calcium 8.5 (L) 8.9 - 10.3 mg/dL  Total Protein 6.6 6.5 - 8.1 g/dL   Albumin 2.5 (L) 3.5 - 5.0 g/dL   AST 14 (L) 15 - 41 U/L   ALT 17 14 - 54 U/L   Alkaline Phosphatase 94 38 - 126 U/L   Total Bilirubin 0.6 0.3 - 1.2 mg/dL   GFR calc non Af Amer 11 (L) >60 mL/min   GFR calc Af Amer 12 (L) >60 mL/min    Comment: (NOTE) The eGFR has been calculated using the CKD EPI equation. This calculation has not been validated in all clinical situations. eGFR's persistently <60 mL/min signify possible Chronic Kidney Disease.    Anion gap 6 5 - 15  Parathyroid hormone, intact (no Ca)     Status: Abnormal   Collection Time: 05/20/17 10:55 AM  Result Value Ref Range   PTH 107 (H) 15 - 65 pg/mL    Comment: (NOTE) Performed At: [facility] [place], [place] [id number] Lindon Romp MD [id number]   Phosphorus     Status: None   Collection Time: 05/20/17 10:55 AM  Result Value Ref Range   Phosphorus 3.8 2.5 - 4.6 mg/dL    Assessment/Plan:  Hypertension blood pressure control important in reducing the progression of atherosclerotic disease. Likely a cause of her renal failure. On appropriate oral medications.   Anemia in chronic kidney disease Hgb was 10. No need for transfusion at this point.  CKD (chronic kidney disease) stage 5, GFR less than 15 ml/min (HCC) The patient has stage V chronic kidney disease nearing dialysis dependence. We had a long discussion today regarding options for treatment and the differences between hemodialysis and peritoneal dialysis. We discussed the procedure peritoneal dialysis catheter placement and the attendant risks and benefits including infection, poor function from scar tissue, and poor clearances. The patient is  in addition proceeding with peritoneal dialysis catheter placement and this will be scheduled at her convenience.      Leotis Pain 06/07/2017, 11:52 AM   This note was created with Dragon medical transcription system.  Any errors from dictation are unintentional.

## 2017-06-07 NOTE — Assessment & Plan Note (Signed)
blood pressure control important in reducing the progression of atherosclerotic disease. Likely a cause of her renal failure. On appropriate oral medications.

## 2017-06-07 NOTE — Patient Instructions (Signed)
Peritoneal Dialysis Dialysis is a procedure that does some of the work healthy kidneys do. Peritoneal dialysis is a kind of dialysis that uses the thin lining of the belly (peritoneum) and a fluid called dialysate to remove wastes, salt, and extra water from the blood. Before beginning peritoneal dialysis you will have surgery to place a thin, plastic tube (catheter) in your belly. The tube will be small, soft, and easy to hide. It will be used to fill your belly with fluid and drain your belly of fluid. How does it work? At the start of the session your belly will be filled with fluid. Wastes, salt, and extra water in your blood will pass through the thin lining of your belly into the fluid. At the end of the session the fluid will be drained into a bag. Then the belly will be refilled with fluid and the procedure will be repeated. The process of draining fluid from the belly and filling the belly with fluid is called an exchange. The time the fluid stays in your body before you drain it is called a dwell. The dwell varies from person to person. It usually takes from 1.5-3 hours. Peritoneal dialysis is done during the day or at night while you are sleeping. When it is done during the day it is called continuous ambulatory peritoneal dialysis (CAPD). In CAPD, exchanges are done up to 5 times a day. Each exchange takes about 30-40 minutes. You may go about your day normally between exchanges. Peritoneal dialysis that is done at night is called continuous cycling peritoneal dialysis (CCPD). In CCPD, a machine called a cycler performs exchanges while you are sleeping. Sometimes a combination of CAPD and CCPD is needed. Preparing for an exchange  Check your blood pressure if told by your doctor.  Warm the fluid bag using a dry heating pad. Leave the fluid in the bag with the cover on while doing this. Do not use a microwave or hot water to warm the fluid bag.  If you are using a machine, make sure it is  set up and programmed correctly.  Keep your tubing free of germs (sterile). ? Make sure the tube in your belly and its cap are free of germs. ? If you are using a machine, make sure the part that attaches the bag and tubing to the the tube in your belly is free of germs. ? Make sure the area around the tube in your belly is free of germs.  Prevent new germs from getting on your tubing. ? Wash your hands thoroughly. Use a gel or foam. ? Close doors and windows. Turn off any fans. Make sure you are in a space without drafts or air currents. Doing these things reduces the chance of infection. ? Wear a mask to cover your nose and mouth. Do this before you wash your hands and touch equipment. Keep the mask on during each exchange.  Examine the fluid bag: ? Make sure the bag is the right size. Size information is on the label. Make sure it has the correct liquid. ? Gently squeeze the bag to make sure there are no leaks. ? Look at the color of the fluid. The fluid should be clear. You should be able to see any writing on the side of the bag clearly through the liquid. Do not use it if it is cloudy. ? Check the expiration date. The expiration date is on the label. If it is past the expiration date, throw the  bag away. How to do an exchange These steps show how exchanges are commonly done. The actual way in which an exchange should be done varies from person to person. Always do exchanges exactly the way that your doctor has trained you to. Remember to always wash your hands before touching any tubing. This is very important. It helps prevent infection. CAPD 1. Hang your fluid bag above your belly on the IV pole. 2. Place a drain bag below your belly. 3. Pull out the ring of y-shaped tubing that connects both bags. 4. Uncap the tube that is connected to the tube in your belly (transfer set). Immediately attach it to the y-shaped tubing of the fluid and drain bags. 5. Twist open the clamp on the  transfer set. This will cause the fluid in your belly to drain through the tube that goes to the drain bag (drain line). It usually takes about 20 minutes for all of the fluid to drain. 6. Once the fluid has finished draining, twist lock the clamp of the transfer set. Then clamp the drain line. 7. Break the seal (frangible) on the tubing that is connected to the fluid bag (fill line). Then unclamp the drain line. Air bubbles will flow into the drain line. Make sure your transfer set is still locked so that no air gets into the belly. 8. Count to 5. Then clamp the drain line. 9. Twist the transfer set clamp to open it. Fluid will flow into your belly. This should take about 10 minutes. 10. When this is done, twist the transfer set clamp to lock it. Then clamp the tubing of the fill line. 11. Detach the tubing from the tube in your belly. Cap the transfer set right away. Tape the transfer set to your belly as told. 12. Look at the fluid that has drained. It may look like urine, but it should be clear. 13. Weigh the drain bag. Write down how much was drained. 14. Check your blood pressure, temperature, and pulse if it is the first exchange of the day. 15. Allow the fluid to stay in the belly for as long as told by your doctor. You may go about your day while the fluid is in your belly.  If your body cannot go all night without an exchange, a machine may be used to perform exchanges while you sleep. The machine is similar to the one used during CCPD. CCPD 1. When you are ready for bed, put the fluid bags onto the machine. Put on exactly the number of bags that your doctor said to use. 2. The machine has a small cartridge (cassette) with tubing that attaches to each fluid bag and the tube that goes to the patient (patient line). Depending on the number of fluid bags you are to use, all the tubes on the cassette may need to be connected to separate fluid bags. If you use fewer bags than there are tubes on  the cassette, the extra lines will need to be clamped. 3. Insert the cassette with the attached tubing into the front of the machine. 4. Make sure that the drain line extends to the toilet. The tip of the drain line should not touch the water in the toilet bowl. 5. Pull the ring on the tubing from the fluid bag on the heating pad of the machine and attach it to the first tube of the cassette. Then break the seal on the tubing of this fluid bag. 6. Repeat this process  with all the bags you need to use. 7. Start the machine. This will prepare (prime) the machine and tubing by filling it with fluid and getting rid of all the air in the tubes. 8. Once all lines are prepared, the machine will tell you to connect yourself. 9. Remove the pull ring from the patient line with one hand. 10. Uncap your transfer set and attach it to the patient line. 11. Twist open the clamp on the transfer set. 12. Press "GO" on the machine. It will begin the draining and filling process. The machine may do 3-5 exchanges overnight, depending on what your doctor recommended. 13. In the morning, record all volumes and times shown on the machine. 14. Press "GO" on the machine. The machine will then tell you to close all clamps. 15. Close your transfer set twist clamp and then clamp all the tubes that go to the machine. 16. When told by the machine, disconnect the patient line from the transfer set. Re-cap your transfer set right away. 17. Tape the transfer set to your belly as told. 18. Check your blood pressure, temperature, and pulse.  The fluid that is in your belly in the morning will stay there during the day. If the body cannot go all day without an exchange, you may need to perform an exchange during the day. Follow the steps under CAPD if an extra exchange is needed.  Follow these instructions at home:  Follow diet instructions as told by your doctor.  Always keep the fluid bags and other supplies in a cool, clean,  and dry place.  Keep a strict schedule. Dialysis must be done every day. Do not skip a day or an exchange. Make sure to make time for each exchange.  Weigh yourself every day. Sudden weight gain may be a sign of a problem.  Take medicines as told by your doctor.  Avoid becoming constipated. Constipation prevents fluid from draining well. To prevent constipation: ? Make sure you eat fiber-rich foods. ? Avoid foods that cause constipation. ? Increase your physical activity. ? Go to the restroom when you feel you need to instead of holding it in. ? Take drugs such as laxatives if told by your doctor. Only take them as told. Get help if:  You have a fever or chills.  You feel sick to your stomach (nauseous) or throw up (vomit).  You have diarrhea.  You have any problems with an exchange.  Your blood pressure increases.  You suddenly gain weight or feel short of breath.  The tube in your belly seems loose or feels like it is coming out.  The fluid that has drained from your belly is pinkish or reddish. Women having their menstrual period do not need to get help if the fluid is only a little pink or red.  There are white strands in the fluid that are large enough to get stuck in your tubing. Get help right away if:  The area around the tube in your belly swells or becomes red, tender, or painful.  There is pus coming from the area around the tube in your belly.  The fluid that has drained from your belly is cloudy.  You have belly pain or discomfort. This information is not intended to replace advice given to you by your health care provider. Make sure you discuss any questions you have with your health care provider. Document Released: 01/19/2011 Document Revised: 05/24/2016 Document Reviewed: 06/15/2013 Elsevier Interactive Patient Education  2018  [employer].

## 2017-06-10 ENCOUNTER — Encounter (INDEPENDENT_AMBULATORY_CARE_PROVIDER_SITE_OTHER): Payer: Self-pay

## 2017-06-10 ENCOUNTER — Other Ambulatory Visit (INDEPENDENT_AMBULATORY_CARE_PROVIDER_SITE_OTHER): Payer: Self-pay | Admitting: Vascular Surgery

## 2017-06-10 ENCOUNTER — Other Ambulatory Visit
Admission: RE | Admit: 2017-06-10 | Discharge: 2017-06-10 | Disposition: A | Discharge status: Home / Self Care | Payer: Medicaid Other | Source: Ambulatory Visit | Attending: Nephrology | Admitting: Nephrology

## 2017-06-10 DIAGNOSIS — I120 Hypertensive chronic kidney disease with stage 5 chronic kidney disease or end stage renal disease: Secondary | ICD-10-CM | POA: Insufficient documentation

## 2017-06-10 DIAGNOSIS — N2581 Secondary hyperparathyroidism of renal origin: Secondary | ICD-10-CM | POA: Diagnosis not present

## 2017-06-10 DIAGNOSIS — N185 Chronic kidney disease, stage 5: Secondary | ICD-10-CM | POA: Diagnosis not present

## 2017-06-10 LAB — COMPREHENSIVE METABOLIC PANEL
ALT: 17 U/L (ref 14–54)
ANION GAP: 8 (ref 5–15)
AST: 12 U/L — ABNORMAL LOW (ref 15–41)
Albumin: 2.8 g/dL — ABNORMAL LOW (ref 3.5–5.0)
Alkaline Phosphatase: 81 U/L (ref 38–126)
BUN: 63 mg/dL — ABNORMAL HIGH (ref 6–20)
CHLORIDE: 106 mmol/L (ref 101–111)
CO2: 19 mmol/L — AB (ref 22–32)
CREATININE: 4.63 mg/dL — AB (ref 0.44–1.00)
Calcium: 8.3 mg/dL — ABNORMAL LOW (ref 8.9–10.3)
GFR, EST AFRICAN AMERICAN: 12 mL/min — AB (ref >60)
GFR, EST NON AFRICAN AMERICAN: 10 mL/min — AB (ref >60)
Glucose, Bld: 111 mg/dL — ABNORMAL HIGH (ref 65–99)
Potassium: 3.8 mmol/L (ref 3.5–5.1)
SODIUM: 133 mmol/L — AB (ref 135–145)
Total Bilirubin: 0.2 mg/dL — ABNORMAL LOW (ref 0.3–1.2)
Total Protein: 6.7 g/dL (ref 6.5–8.1)

## 2017-06-10 LAB — CBC WITH DIFFERENTIAL/PLATELET
BASOS PCT: 1 %
Basophils Absolute: 0.1 10*3/uL (ref 0–0.1)
EOS ABS: 0.2 10*3/uL (ref 0–0.7)
Eosinophils Relative: 3 %
HEMATOCRIT: 30.6 % — AB (ref 35.0–47.0)
HEMOGLOBIN: 9.8 g/dL — AB (ref 12.0–16.0)
LYMPHS ABS: 1.9 10*3/uL (ref 1.0–3.6)
Lymphocytes Relative: 26 %
MCH: 28.9 pg (ref 26.0–34.0)
MCHC: 32 g/dL (ref 32.0–36.0)
MCV: 90.2 fL (ref 80.0–100.0)
MONOS PCT: 7 %
Monocytes Absolute: 0.5 10*3/uL (ref 0.2–0.9)
NEUTROS ABS: 4.6 10*3/uL (ref 1.4–6.5)
NEUTROS PCT: 63 %
Platelets: 327 10*3/uL (ref 150–440)
RBC: 3.39 MIL/uL — AB (ref 3.80–5.20)
RDW: 14.7 % — ABNORMAL HIGH (ref 11.5–14.5)
WBC: 7.3 10*3/uL (ref 3.6–11.0)

## 2017-06-11 ENCOUNTER — Other Ambulatory Visit (INDEPENDENT_AMBULATORY_CARE_PROVIDER_SITE_OTHER): Payer: Self-pay | Admitting: Vascular Surgery

## 2017-06-11 LAB — PROTEIN / CREATININE RATIO, URINE
Creatinine, Urine: 228 mg/dL
PROTEIN CREATININE RATIO: 4.12 mg/mg{creat} — AB (ref 0.00–0.15)
Total Protein, Urine: 940 mg/dL

## 2017-06-12 ENCOUNTER — Telehealth: Payer: Self-pay

## 2017-06-12 DIAGNOSIS — J440 Chronic obstructive pulmonary disease with acute lower respiratory infection: Secondary | ICD-10-CM

## 2017-06-12 DIAGNOSIS — E1169 Type 2 diabetes mellitus with other specified complication: Secondary | ICD-10-CM

## 2017-06-12 DIAGNOSIS — E785 Hyperlipidemia, unspecified: Principal | ICD-10-CM

## 2017-06-12 DIAGNOSIS — J209 Acute bronchitis, unspecified: Secondary | ICD-10-CM

## 2017-06-12 MED ORDER — TIOTROPIUM BROMIDE MONOHYDRATE 18 MCG IN CAPS: 18.0000 ug | ORAL_CAPSULE | Freq: Two times a day (BID) | RESPIRATORY_TRACT | 3 refills | Status: DC

## 2017-06-12 MED ORDER — AMLODIPINE BESYLATE 10 MG PO TABS: 10.0000 mg | ORAL_TABLET | Freq: Every day | ORAL | 3 refills | Status: DC

## 2017-06-12 MED ORDER — METOPROLOL TARTRATE 25 MG PO TABS: 25.0000 mg | ORAL_TABLET | Freq: Two times a day (BID) | ORAL | 3 refills | Status: DC

## 2017-06-12 NOTE — Telephone Encounter (Signed)
Medication Refill

## 2017-06-13 ENCOUNTER — Ambulatory Visit: Payer: Self-pay

## 2017-06-14 ENCOUNTER — Inpatient Hospital Stay: Admission: RE | Admit: 2017-06-14 | Payer: Self-pay | Source: Ambulatory Visit

## 2017-06-18 ENCOUNTER — Telehealth (INDEPENDENT_AMBULATORY_CARE_PROVIDER_SITE_OTHER): Payer: Self-pay

## 2017-06-18 NOTE — Telephone Encounter (Signed)
Called patient to let her know what is going on with her surgery. The patient decided after being scheduled for a PD cath placement that she would rather have a hemodialysis cath placed instead. Per Dr. Lucky Cowboy the patient needs vein mapping, we need an order from her nephrologist for a consult for a hemodialysis cath placement and from there her surgery can be scheduled. I spoke with the patient and let her know all of this and that she will need to have a discussion with her nephrologist regarding having this change because he sent an order for a PD cath insertion.

## 2017-06-19 ENCOUNTER — Ambulatory Visit: Admit: 2017-06-19 | Payer: MEDICAID | Admitting: Vascular Surgery

## 2017-06-19 SURGERY — LAPAROSCOPIC INSERTION CONTINUOUS AMBULATORY PERITONEAL DIALYSIS  (CAPD) CATHETER
Anesthesia: General

## 2017-06-20 ENCOUNTER — Ambulatory Visit: Payer: Self-pay | Admitting: Family Medicine

## 2017-06-20 VITALS — BP 109/72 | HR 78 | Temp 98.9°F | Wt 231.5 lb

## 2017-06-20 DIAGNOSIS — N185 Chronic kidney disease, stage 5: Secondary | ICD-10-CM

## 2017-06-20 DIAGNOSIS — I129 Hypertensive chronic kidney disease with stage 1 through stage 4 chronic kidney disease, or unspecified chronic kidney disease: Secondary | ICD-10-CM

## 2017-06-20 DIAGNOSIS — D631 Anemia in chronic kidney disease: Secondary | ICD-10-CM

## 2017-06-20 DIAGNOSIS — J449 Chronic obstructive pulmonary disease, unspecified: Secondary | ICD-10-CM | POA: Insufficient documentation

## 2017-06-20 MED ORDER — ALBUTEROL SULFATE (2.5 MG/3ML) 0.083% IN NEBU
2.5000 mg | INHALATION_SOLUTION | Freq: Three times a day (TID) | RESPIRATORY_TRACT | 3 refills | Status: DC
Start: 2017-06-20 — End: 2019-01-03

## 2017-06-20 MED ORDER — ALBUTEROL SULFATE HFA 108 (90 BASE) MCG/ACT IN AERS: 2.0000 | INHALATION_SPRAY | Freq: Four times a day (QID) | RESPIRATORY_TRACT | 3 refills | Status: DC | PRN

## 2017-06-20 MED ORDER — FUROSEMIDE 40 MG PO TABS: 40.0000 mg | ORAL_TABLET | Freq: Two times a day (BID) | ORAL | 3 refills | Status: DC

## 2017-06-20 MED ORDER — FERROUS SULFATE DRIED ER 160 (50 FE) MG PO TBCR: 160.0000 mg | EXTENDED_RELEASE_TABLET | Freq: Two times a day (BID) | ORAL | 3 refills | Status: DC

## 2017-06-20 NOTE — Assessment & Plan Note (Signed)
Continue to follow with Dr. Holley Raring and Dr. Lucky Cowboy- due to see Dr. Holley Raring on 7/12. He just ordered labs 10 days ago, so we will hold on labs today. Continue to monitor and to prepare for dialysis.

## 2017-06-20 NOTE — Progress Notes (Signed)
BP 109/72   Pulse 78   Temp 98.9 F (37.2 C)   Wt 231 lb 8 oz (105 kg)   LMP 05/29/2017 (Exact Date)   BMI 37.37 kg/m    Subjective:    Patient ID: Janet Olson, female    DOB: Apr 28, 1970, 47 y.o.   MRN: [id number]  HPI: Andre Gallego is a 47 y.o. female  Chief Complaint  Patient presents with  . Follow-up   Here for 1 month follow up of acute renal failure. Following with Dr. Holley Raring for nephrology and Dr. Lucky Cowboy for vascular. Saw Dr. Lucky Cowboy on 6/8 for evaluation for PD catheter placement for permanent dialysis access. She was scheduled to have this done, but changed her mind earlier this week and would rather had a hemodialysis catheter placed. This is going to take a couple more steps, including vein mapping prior to being scheduled. She will follow up with her nephrologist to get moving on this. Due to see Dr. Holley Raring again on 7/12.   Labs last checked 10 days ago. GFR 12- down from 15 2 months ago.  HYPERTENSION- diagnosed 1 month ago in the hospital  Hypertension status: controlled  Satisfied with current treatment? yes Duration of hypertension: diagnosed 1 month ago BP monitoring frequency:  not checking BP medication side effects:  no Medication compliance: excellent compliance Previous BP meds: lasix, metoprolol, amlodipine Aspirin: no Recurrent headaches: no Visual changes: no Palpitations: no Dyspnea: no Chest pain: no Lower extremity edema: yes Dizzy/lightheaded: no  COPD- has been out of her spiriva for about 2 weeks, medication management has been working on getting it for her COPD status: uncontrolled Satisfied with current treatment?: no Oxygen use: no Dyspnea frequency: with moving around Cough frequency: every day and night Rescue inhaler frequency:  1x every few days Limitation of activity: yes Productive cough: no  Relevant past medical, surgical, family and social history reviewed and updated as indicated. Interim medical history  since our last visit reviewed. Allergies and medications reviewed and updated.  Review of Systems  Constitutional: Negative.   Respiratory: Positive for cough and shortness of breath. Negative for apnea, choking, chest tightness, wheezing and stridor.   Cardiovascular: Positive for leg swelling. Negative for chest pain and palpitations.  Psychiatric/Behavioral: Negative.     Per HPI unless specifically indicated above     Objective:    BP 109/72   Pulse 78   Temp 98.9 F (37.2 C)   Wt 231 lb 8 oz (105 kg)   LMP 05/29/2017 (Exact Date)   BMI 37.37 kg/m   Wt Readings from Last 3 Encounters:  06/20/17 231 lb 8 oz (105 kg)  06/07/17 230 lb (104.3 kg)  05/23/17 230 lb 12.8 oz (104.7 kg)    Physical Exam  Constitutional: She is oriented to person, place, and time. She appears well-developed and well-nourished. No distress.  HENT:  Head: Normocephalic and atraumatic.  Right Ear: Hearing normal.  Left Ear: Hearing normal.  Nose: Nose normal.  Eyes: Conjunctivae and lids are normal. Right eye exhibits no discharge. Left eye exhibits no discharge. No scleral icterus.  Cardiovascular: Normal rate, regular rhythm, normal heart sounds and intact distal pulses.  Exam reveals no gallop and no friction rub.   No murmur heard. Pulmonary/Chest: Effort normal. No respiratory distress. She has decreased breath sounds in the right upper field, the right middle field, the right lower field, the left upper field, the left middle field and the left lower field. She has no  wheezes. She has no rales. She exhibits no tenderness.  Musculoskeletal: Normal range of motion.  Neurological: She is alert and oriented to person, place, and time.  Skin: Skin is warm, dry and intact. No rash noted. She is not diaphoretic. No erythema. No pallor.  Psychiatric: She has a normal mood and affect. Her speech is normal and behavior is normal. Judgment and thought content normal. Cognition and memory are normal.   Nursing note and vitals reviewed.   Results for orders placed or performed during the hospital encounter of 06/10/17  Comprehensive metabolic panel  Result Value Ref Range   Sodium 133 (L) 135 - 145 mmol/L   Potassium 3.8 3.5 - 5.1 mmol/L   Chloride 106 101 - 111 mmol/L   CO2 19 (L) 22 - 32 mmol/L   Glucose, Bld 111 (H) 65 - 99 mg/dL   BUN 63 (H) 6 - 20 mg/dL   Creatinine, Ser 4.63 (H) 0.44 - 1.00 mg/dL   Calcium 8.3 (L) 8.9 - 10.3 mg/dL   Total Protein 6.7 6.5 - 8.1 g/dL   Albumin 2.8 (L) 3.5 - 5.0 g/dL   AST 12 (L) 15 - 41 U/L   ALT 17 14 - 54 U/L   Alkaline Phosphatase 81 38 - 126 U/L   Total Bilirubin 0.2 (L) 0.3 - 1.2 mg/dL   GFR calc non Af Amer 10 (L) >60 mL/min   GFR calc Af Amer 12 (L) >60 mL/min   Anion gap 8 5 - 15  CBC with Differential/Platelet  Result Value Ref Range   WBC 7.3 3.6 - 11.0 K/uL   RBC 3.39 (L) 3.80 - 5.20 MIL/uL   Hemoglobin 9.8 (L) 12.0 - 16.0 g/dL   HCT 30.6 (L) 35.0 - 47.0 %   MCV 90.2 80.0 - 100.0 fL   MCH 28.9 26.0 - 34.0 pg   MCHC 32.0 32.0 - 36.0 g/dL   RDW 14.7 (H) 11.5 - 14.5 %   Platelets 327 150 - 440 K/uL   Neutrophils Relative % 63 %   Neutro Abs 4.6 1.4 - 6.5 K/uL   Lymphocytes Relative 26 %   Lymphs Abs 1.9 1.0 - 3.6 K/uL   Monocytes Relative 7 %   Monocytes Absolute 0.5 0.2 - 0.9 K/uL   Eosinophils Relative 3 %   Eosinophils Absolute 0.2 0 - 0.7 K/uL   Basophils Relative 1 %   Basophils Absolute 0.1 0 - 0.1 K/uL  Protein / creatinine ratio, urine  Result Value Ref Range   Creatinine, Urine 228 mg/dL   Total Protein, Urine 940 mg/dL   Protein Creatinine Ratio 4.12 (H) 0.00 - 0.15 mg/mg[Cre]      Assessment & Plan:   Problem List Items Addressed This Visit      Respiratory   COPD (chronic obstructive pulmonary disease) ([place])    Has been out of her spiriva. Work with medication management to obtain this. Stable. Lungs decreased breath sounds, but clear. Call with any concerns. Refills of her rescues given today.       Relevant Medications   albuterol (PROVENTIL HFA;VENTOLIN HFA) 108 (90 Base) MCG/ACT inhaler   albuterol (PROVENTIL) (2.5 MG/3ML) 0.083% nebulizer solution     Genitourinary   Benign hypertensive renal disease    Under good control today. Continue current regimen. Continue to follow. Continue to follow with Dr. Lucky Cowboy and Dr. Holley Raring      Anemia in chronic kidney disease    Being monitored by Dr. Holley Raring- last CBC hemoglobin dropped.  Will refill her iron today. Continue to monitor. Continue to follow with nephrology and vascular. Call with any concerns.       Relevant Medications   ferrous sulfate (EQL SLOW RELEASE IRON) 160 (50 Fe) MG TBCR SR tablet   CKD (chronic kidney disease) stage 5, GFR less than 15 ml/min ([facility]) - Primary    Continue to follow with Dr. Holley Raring and Dr. Lucky Cowboy- due to see Dr. Holley Raring on 7/12. He just ordered labs 10 days ago, so we will hold on labs today. Continue to monitor and to prepare for dialysis.           Follow up plan: Return in about 4 weeks (around 07/18/2017) for Follow up.

## 2017-06-20 NOTE — Assessment & Plan Note (Signed)
Being monitored by Dr. Holley Raring- last CBC hemoglobin dropped. Will refill her iron today. Continue to monitor. Continue to follow with nephrology and vascular. Call with any concerns.

## 2017-06-20 NOTE — Assessment & Plan Note (Signed)
Under good control today. Continue current regimen. Continue to follow. Continue to follow with Dr. Lucky Cowboy and Dr. Holley Raring

## 2017-06-20 NOTE — Assessment & Plan Note (Signed)
Has been out of her spiriva. Work with medication management to obtain this. Stable. Lungs decreased breath sounds, but clear. Call with any concerns. Refills of her rescues given today.

## 2017-06-21 ENCOUNTER — Telehealth: Payer: Self-pay

## 2017-06-21 NOTE — Telephone Encounter (Signed)
Received PAP application from [facility] for Spiriva placed for provider to sign.

## 2017-06-21 NOTE — Telephone Encounter (Signed)
Placed signed application/script in [facility] folder for pickup. 

## 2017-06-25 ENCOUNTER — Telehealth: Payer: Self-pay

## 2017-06-25 NOTE — Telephone Encounter (Signed)
Placed signed application/script in [facility] folder for pickup. 

## 2017-06-25 NOTE — Telephone Encounter (Signed)
Received PAP application from [facility] for Ventolin HFA placed for provider to sign.

## 2017-06-26 ENCOUNTER — Telehealth: Payer: Self-pay | Admitting: Pharmacist

## 2017-06-26 NOTE — Telephone Encounter (Signed)
06/26/17 [place] application with script for Enrollment-Spiriva 18 mcg Inhale the contents of one capsule daily.Delos Haring

## 2017-07-11 ENCOUNTER — Encounter: Payer: Self-pay | Admitting: Pharmacist

## 2017-07-11 ENCOUNTER — Other Ambulatory Visit
Admission: RE | Admit: 2017-07-11 | Discharge: 2017-07-11 | Disposition: A | Discharge status: Home / Self Care | Payer: Medicaid Other | Source: Ambulatory Visit | Attending: Nephrology | Admitting: Nephrology

## 2017-07-11 DIAGNOSIS — N186 End stage renal disease: Secondary | ICD-10-CM | POA: Diagnosis not present

## 2017-07-11 LAB — CBC WITH DIFFERENTIAL/PLATELET
Basophils Absolute: 0.1 10*3/uL (ref 0–0.1)
Basophils Relative: 1 %
EOS PCT: 4 %
Eosinophils Absolute: 0.3 10*3/uL (ref 0–0.7)
HCT: 33.7 % — ABNORMAL LOW (ref 35.0–47.0)
Hemoglobin: 11 g/dL — ABNORMAL LOW (ref 12.0–16.0)
LYMPHS ABS: 1.8 10*3/uL (ref 1.0–3.6)
LYMPHS PCT: 25 %
MCH: 29.2 pg (ref 26.0–34.0)
MCHC: 32.6 g/dL (ref 32.0–36.0)
MCV: 89.6 fL (ref 80.0–100.0)
MONO ABS: 0.4 10*3/uL (ref 0.2–0.9)
MONOS PCT: 6 %
Neutro Abs: 4.6 10*3/uL (ref 1.4–6.5)
Neutrophils Relative %: 64 %
PLATELETS: 387 10*3/uL (ref 150–440)
RBC: 3.76 MIL/uL — ABNORMAL LOW (ref 3.80–5.20)
RDW: 14.2 % (ref 11.5–14.5)
WBC: 7.2 10*3/uL (ref 3.6–11.0)

## 2017-07-11 LAB — COMPREHENSIVE METABOLIC PANEL
ALT: 19 U/L (ref 14–54)
AST: 12 U/L — AB (ref 15–41)
Albumin: 3 g/dL — ABNORMAL LOW (ref 3.5–5.0)
Alkaline Phosphatase: 88 U/L (ref 38–126)
Anion gap: 9 (ref 5–15)
BILIRUBIN TOTAL: 0.3 mg/dL (ref 0.3–1.2)
BUN: 58 mg/dL — AB (ref 6–20)
CHLORIDE: 110 mmol/L (ref 101–111)
CO2: 20 mmol/L — ABNORMAL LOW (ref 22–32)
Calcium: 8.8 mg/dL — ABNORMAL LOW (ref 8.9–10.3)
Creatinine, Ser: 4.82 mg/dL — ABNORMAL HIGH (ref 0.44–1.00)
GFR calc Af Amer: 11 mL/min — ABNORMAL LOW (ref >60)
GFR calc non Af Amer: 10 mL/min — ABNORMAL LOW (ref >60)
Glucose, Bld: 107 mg/dL — ABNORMAL HIGH (ref 65–99)
POTASSIUM: 4.6 mmol/L (ref 3.5–5.1)
Sodium: 139 mmol/L (ref 135–145)
Total Protein: 7 g/dL (ref 6.5–8.1)

## 2017-07-11 LAB — PHOSPHORUS: Phosphorus: 4.3 mg/dL (ref 2.5–4.6)

## 2017-07-11 LAB — IRON AND TIBC
Iron: 66 ug/dL (ref 28–170)
Saturation Ratios: 22 % (ref 10.4–31.8)
TIBC: 303 ug/dL (ref 250–450)
UIBC: 237 ug/dL

## 2017-07-12 LAB — PARATHYROID HORMONE, INTACT (NO CA): PTH: 161 pg/mL — AB (ref 15–65)

## 2017-07-12 LAB — HEPATITIS B SURFACE ANTIGEN: HEP B S AG: NEGATIVE

## 2017-07-12 LAB — HEPATITIS B SURFACE ANTIBODY, QUANTITATIVE: Hepatitis B-Post: <3.1 m[IU]/mL — ABNORMAL LOW (ref >9.9)

## 2017-07-16 ENCOUNTER — Other Ambulatory Visit (INDEPENDENT_AMBULATORY_CARE_PROVIDER_SITE_OTHER): Payer: Self-pay

## 2017-07-16 ENCOUNTER — Encounter
Admission: RE | Disposition: A | Discharge status: Home / Self Care | Payer: Self-pay | Source: Ambulatory Visit | Attending: Vascular Surgery

## 2017-07-16 ENCOUNTER — Ambulatory Visit
Admission: RE | Admit: 2017-07-16 | Discharge: 2017-07-16 | Disposition: A | Discharge status: Home / Self Care | Payer: Medicaid Other | Source: Ambulatory Visit | Attending: Vascular Surgery | Admitting: Vascular Surgery

## 2017-07-16 DIAGNOSIS — D631 Anemia in chronic kidney disease: Secondary | ICD-10-CM | POA: Diagnosis not present

## 2017-07-16 DIAGNOSIS — Z9889 Other specified postprocedural states: Secondary | ICD-10-CM | POA: Insufficient documentation

## 2017-07-16 DIAGNOSIS — N186 End stage renal disease: Secondary | ICD-10-CM | POA: Diagnosis not present

## 2017-07-16 DIAGNOSIS — J449 Chronic obstructive pulmonary disease, unspecified: Secondary | ICD-10-CM | POA: Insufficient documentation

## 2017-07-16 DIAGNOSIS — K219 Gastro-esophageal reflux disease without esophagitis: Secondary | ICD-10-CM | POA: Diagnosis not present

## 2017-07-16 DIAGNOSIS — Z833 Family history of diabetes mellitus: Secondary | ICD-10-CM | POA: Diagnosis not present

## 2017-07-16 DIAGNOSIS — F17210 Nicotine dependence, cigarettes, uncomplicated: Secondary | ICD-10-CM | POA: Diagnosis not present

## 2017-07-16 DIAGNOSIS — T82868A Thrombosis of vascular prosthetic devices, implants and grafts, initial encounter: Secondary | ICD-10-CM | POA: Diagnosis not present

## 2017-07-16 DIAGNOSIS — Z992 Dependence on renal dialysis: Secondary | ICD-10-CM | POA: Diagnosis not present

## 2017-07-16 HISTORY — PX: DIALYSIS/PERMA CATHETER INSERTION: CATH118288

## 2017-07-16 LAB — QUANTIFERON IN TUBE
QUANTIFERON MITOGEN VALUE: 1.67 [IU]/mL
QUANTIFERON NIL VALUE: 0.04 [IU]/mL
QUANTIFERON TB AG VALUE: 0.03 [IU]/mL
QUANTIFERON TB GOLD: NEGATIVE

## 2017-07-16 LAB — POTASSIUM (ARMC VASCULAR LAB ONLY): POTASSIUM (ARMC VASCULAR LAB): 4.5 (ref 3.5–5.1)

## 2017-07-16 LAB — QUANTIFERON TB GOLD ASSAY (BLOOD)

## 2017-07-16 SURGERY — DIALYSIS/PERMA CATHETER INSERTION
Anesthesia: Moderate Sedation

## 2017-07-16 MED ORDER — LIDOCAINE-EPINEPHRINE (PF) 2 %-1:200000 IJ SOLN
INTRAMUSCULAR | Status: AC
  Filled 2017-07-16: qty 20

## 2017-07-16 MED ORDER — MIDAZOLAM HCL 5 MG/5ML IJ SOLN
INTRAMUSCULAR | Status: AC
  Filled 2017-07-16: qty 5

## 2017-07-16 MED ORDER — MIDAZOLAM HCL 2 MG/2ML IJ SOLN
INTRAMUSCULAR | Status: DC | PRN
  Administered 2017-07-16: 1.5 mg via INTRAVENOUS
  Administered 2017-07-16: 2 mg via INTRAVENOUS
  Administered 2017-07-16: 1 mg via INTRAVENOUS
  Administered 2017-07-16: 0.5 mg via INTRAVENOUS

## 2017-07-16 MED ORDER — HEPARIN SODIUM (PORCINE) 10000 UNIT/ML IJ SOLN
INTRAMUSCULAR | Status: AC
  Filled 2017-07-16: qty 1

## 2017-07-16 MED ORDER — FENTANYL CITRATE (PF) 100 MCG/2ML IJ SOLN
INTRAMUSCULAR | Status: AC
  Filled 2017-07-16: qty 2

## 2017-07-16 MED ORDER — SODIUM CHLORIDE 0.9 % IV SOLN
INTRAVENOUS | Status: DC
  Administered 2017-07-16: 14:00:00 via INTRAVENOUS

## 2017-07-16 MED ORDER — HEPARIN (PORCINE) IN NACL 2-0.9 UNIT/ML-% IJ SOLN
INTRAMUSCULAR | Status: AC
Start: 2017-07-16 — End: 2017-07-16
  Filled 2017-07-16: qty 500

## 2017-07-16 MED ORDER — FENTANYL CITRATE (PF) 100 MCG/2ML IJ SOLN
INTRAMUSCULAR | Status: DC | PRN
  Administered 2017-07-16 (×2): 25 ug via INTRAVENOUS
  Administered 2017-07-16: 50 ug via INTRAVENOUS
  Administered 2017-07-16: 25 ug via INTRAVENOUS

## 2017-07-16 MED ORDER — CEFAZOLIN SODIUM-DEXTROSE 2-4 GM/100ML-% IV SOLN
2.0000 g | Freq: Once | INTRAVENOUS | Status: AC
  Administered 2017-07-16: 2 g via INTRAVENOUS

## 2017-07-16 SURGICAL SUPPLY — 10 items
CATH PALINDROME RT-P 15FX19CM (CATHETERS) ×3 IMPLANT
DERMABOND ADVANCED (GAUZE/BANDAGES/DRESSINGS) ×2
DERMABOND ADVANCED .7 DNX12 (GAUZE/BANDAGES/DRESSINGS) ×1 IMPLANT
GUIDEWIRE AMPLATZ SHORT (WIRE) ×3 IMPLANT
NEEDLE ENTRY 21GA 7CM ECHOTIP (NEEDLE) ×3 IMPLANT
PACK ANGIOGRAPHY (CUSTOM PROCEDURE TRAY) ×3 IMPLANT
SET INTRO CAPELLA COAXIAL (SET/KITS/TRAYS/PACK) ×3 IMPLANT
SUT MNCRL AB 4-0 PS2 18 (SUTURE) ×3 IMPLANT
SUT SILK 0 FSL (SUTURE) ×3 IMPLANT
TOWEL OR 17X26 4PK STRL BLUE (TOWEL DISPOSABLE) ×3 IMPLANT

## 2017-07-16 NOTE — Discharge Instructions (Signed)

## 2017-07-16 NOTE — H&P (Signed)
[facility] Admission History & Physical  MRN : [id number]  Janet Olson is a 47 y.o. (10-15-1970) female who presents with chief complaint of worsening renal failure.  History of Present Illness: I am asked to evaluate the patient by the dialysis center. The patient was sent here because they have continued to deteriorate with respect to their renal function. They have now reached a creatinine clearance of 10. There feeling increased fatigue loss of appetite and a small bit of nausea.  At this point nephrology feels that she must start hemodialysis and I'm asked to evaluate her for access.  Current Facility-Administered Medications  Medication Dose Route Frequency Provider Last Rate Last Dose  . 0.9 %  sodium chloride infusion   Intravenous Continuous Kelleigh Skerritt, Dolores Lory, MD 20 mL/hr at 07/16/17 1345    . ceFAZolin (ANCEF) IVPB 2g/100 mL premix  2 g Intravenous Once Talon Regala, Dolores Lory, MD        Past Medical History:  Diagnosis Date  . Acute renal failure (ARF) ([place]) 05/17/2017  . Anemia   . Asthma   . Chronic kidney disease   . COPD (chronic obstructive pulmonary disease) ([place])   . GERD (gastroesophageal reflux disease)   . Ulcer     Past Surgical History:  Procedure Laterality Date  . CESAREAN SECTION     x 4  . excision of lymph nodes Bilateral 2014   bilateral under arms.    Social History Social History  Substance Use Topics  . Smoking status: Current Every Day Smoker    Packs/day: 0.50    Years: 30.00    Types: Cigarettes  . Smokeless tobacco: Never Used  . Alcohol use Yes    Family History Family History  Problem Relation Age of Onset  . Diabetes Mother     No family history of bleeding or clotting disorders, autoimmune disease or porphyria  No Known Allergies   REVIEW OF SYSTEMS (Negative unless checked)  Constitutional: [] Weight loss  [] Fever  [] Chills Cardiac: [] Chest pain   [] Chest pressure   [] Palpitations    [] Shortness of breath when laying flat   [] Shortness of breath at rest   [x] Shortness of breath with exertion. Vascular:  [] Pain in legs with walking   [] Pain in legs at rest   [] Pain in legs when laying flat   [] Claudication   [] Pain in feet when walking  [] Pain in feet at rest  [] Pain in feet when laying flat   [] History of DVT   [] Phlebitis   [] Swelling in legs   [] Varicose veins   [] Non-healing ulcers Pulmonary:   [] Uses home oxygen   [] Productive cough   [] Hemoptysis   [] Wheeze  [] COPD   [] Asthma Neurologic:  [] Dizziness  [] Blackouts   [] Seizures   [] History of stroke   [] History of TIA  [] Aphasia   [] Temporary blindness   [] Dysphagia   [] Weakness or numbness in arms   [] Weakness or numbness in legs Musculoskeletal:  [] Arthritis   [] Joint swelling   [] Joint pain   [] Low back pain Hematologic:  [] Easy bruising  [] Easy bleeding   [] Hypercoagulable state   [] Anemic  [] Hepatitis Gastrointestinal:  [] Blood in stool   [] Vomiting blood  [] Gastroesophageal reflux/heartburn   [] Difficulty swallowing. Genitourinary:  [x] Chronic kidney disease   [] Difficult urination  [] Frequent urination  [] Burning with urination   [] Blood in urine Skin:  [] Rashes   [] Ulcers   [] Wounds Psychological:  [] History of anxiety   []  History of major depression.  Physical Examination  Vitals:   07/16/17 1326  BP: 121/84  Pulse: 76  Resp: 18  Temp: 97.8 F (36.6 C)  SpO2: 100%  Weight: 104.3 kg (230 lb)  Height: 5\' 6"  (1.676 m)   Body mass index is 37.12 kg/m. Gen: WD/WN, NAD Head: [place]/AT, No temporalis wasting. Prominent temp pulse not noted. Ear/Nose/Throat: Hearing grossly intact, nares w/o erythema or drainage, oropharynx w/o Erythema/Exudate,  Eyes: Conjunctiva clear, sclera non-icteric Neck: Trachea midline.  No JVD.  Pulmonary:  Good air movement, respirations not labored, no use of accessory muscles.  Cardiac: RRR, normal S1, S2. Vascular:  Vessel Right Left  Radial Palpable Palpable  Ulnar Not Palpable  Not Palpable  Brachial Palpable Palpable  Carotid Palpable, without bruit Palpable, without bruit  Gastrointestinal: soft, non-tender/non-distended. No guarding/reflex.  Musculoskeletal: M/S 5/5 throughout.  Extremities without ischemic changes.  No deformity or atrophy.  Neurologic: Sensation grossly intact in extremities.  Symmetrical.  Speech is fluent. Motor exam as listed above. Psychiatric: Judgment intact, Mood & affect appropriate for pt's clinical situation. Dermatologic: No rashes or ulcers noted.  No cellulitis or open wounds. Lymph : No Cervical, Axillary, or Inguinal lymphadenopathy.   CBC Lab Results  Component Value Date   WBC 7.2 07/11/2017   HGB 11.0 (L) 07/11/2017   HCT 33.7 (L) 07/11/2017   MCV 89.6 07/11/2017   PLT 387 07/11/2017    BMET    Component Value Date/Time   NA 139 07/11/2017 1103   NA 139 09/23/2014 1741   K 4.6 07/11/2017 1103   K 4.2 09/23/2014 1741   CL 110 07/11/2017 1103   CL 112 (H) 09/23/2014 1741   CO2 20 (L) 07/11/2017 1103   CO2 21 09/23/2014 1741   GLUCOSE 107 (H) 07/11/2017 1103   GLUCOSE 82 09/23/2014 1741   BUN 58 (H) 07/11/2017 1103   BUN 18 09/23/2014 1741   CREATININE 4.82 (H) 07/11/2017 1103   CREATININE 1.41 (H) 09/23/2014 1741   CALCIUM 8.8 (L) 07/11/2017 1103   CALCIUM 8.9 09/23/2014 1741   GFRNONAA 10 (L) 07/11/2017 1103   GFRNONAA 43 (L) 09/23/2014 1741   GFRNONAA 43 (L) 11/20/2012 1129   GFRAA 11 (L) 07/11/2017 1103   GFRAA 52 (L) 09/23/2014 1741   GFRAA 49 (L) 11/20/2012 1129   Estimated Creatinine Clearance: 17.2 mL/min (A) (by C-G formula based on SCr of 4.82 mg/dL (H)).  COAG No results found for: INR, PROTIME  Radiology No results found.  Assessment/Plan 1.  Complication dialysis device with thrombosis AV access:  The patient will require a tunneled catheter The risks and benefits were described to the patient.  All questions were answered.  The patient agrees to proceed with angiography and  intervention. Potassium will be drawn to ensure that it is an appropriate level prior to performing intervention. 2.  End-stage renal disease requiring hemodialysis:  Patient will now begin dialysis therapy without further interruption. Dialysis has already been arranged. 3.  GERD:  Patient will continue medical management;  no changes in oral medications. 4. COPD:  O2 saturation will be monitored and aerosol medications will be continued.   Hortencia Pilar, MD  07/16/2017 3:44 PM

## 2017-07-16 NOTE — Op Note (Signed)
OPERATIVE NOTE   PROCEDURE: 1. Insertion of tunneled dialysis catheter right IJ approach with ultrasound and fluoroscopic guidance.  PRE-OPERATIVE DIAGNOSIS: Chronic end-stage renal insufficiency  POST-OPERATIVE DIAGNOSIS: Same  SURGEON: Hortencia Pilar.  ANESTHESIA: Conscious sedation was administered under my direct supervision by the interventional radiology RN. IV Versed plus fentanyl were utilized. Continuous ECG, pulse oximetry and blood pressure was monitored throughout the entire procedure. Conscious sedation was for a total of 43 minutes.  ESTIMATED BLOOD LOSS: Minimal cc  CONTRAST USED:  None  FLUOROSCOPY TIME:  3.0 minutes  INDICATIONS:   Janet Engdahl Baldwinis a 47 y.o. y.o. female who presents with worsening renal function. Her creatinine clearance is now 10 and she will be initiating dialysis. She therefore needs appropriate access and a tunnel catheters being placed.  Risks and benefits were reviewed with the patient all questions were answered patient agrees to proceed.  DESCRIPTION: After obtaining full informed written consent, the patient was positioned supine. The right neck and chest wall was prepped and draped in a sterile fashion. Ultrasound was placed in a sterile sleeve. Ultrasound was utilized to identify the right internal jugular vein which is noted to be echolucent and compressible indicating patency. Image is recorded for the permanent record. Under direct ultrasound visualization a micro-needle is inserted into the vein followed by the micro-wire. Micro-sheath was then advanced and a J wire is inserted without difficulty under fluoroscopic guidance. Small counterincision was made at the wire insertion site. Dilators are passed over the wire and the tunneled dialysis catheter is fed into the central venous system without difficulty.  Under fluoroscopy the catheter tip positioned at the atrial caval junction. The catheter is then approximated to the chest wall  and an exit site selected. 1% lidocaine is infiltrated in soft tissues at this level small incision is made and the tunneling device is then passed from the exit site to the neck counterincision. Catheter is then connected to the tunneling device and the catheter was pulled subcutaneously. It is then transected and the hub assembly connected without difficulty. Both lumens aspirate and flush easily. After verification of smooth contour with proper tip position under fluoroscopy the catheter is packed with 5000 units of heparin per lumen.  Catheter secured to the skin of the right chest wall with 0 silk. A sterile dressing is applied with a Biopatch.  COMPLICATIONS: None  CONDITION: Good  Hortencia Pilar [facility]. Office:  [phone]   07/16/2017,4:34 PM

## 2017-07-17 ENCOUNTER — Encounter: Payer: Self-pay | Admitting: Vascular Surgery

## 2017-07-17 ENCOUNTER — Telehealth (INDEPENDENT_AMBULATORY_CARE_PROVIDER_SITE_OTHER): Payer: Self-pay

## 2017-07-17 NOTE — Telephone Encounter (Signed)
Patient called stating that she is in a lot of pain and that she didn't sleep any last night. She is requesting some type of pain medication. Patient was advised to continue taking her Tylenol as told to her on her discharge paperwork. She says that there is no redness or drainage, just pain.

## 2017-07-24 ENCOUNTER — Telehealth: Payer: Self-pay | Admitting: Nurse Practitioner

## 2017-07-24 ENCOUNTER — Ambulatory Visit: Payer: Self-pay | Admitting: Internal Medicine

## 2017-07-24 ENCOUNTER — Encounter: Payer: Self-pay | Admitting: Internal Medicine

## 2017-07-24 VITALS — BP 107/71 | HR 78 | Temp 98.6°F | Wt 237.8 lb

## 2017-07-24 DIAGNOSIS — J449 Chronic obstructive pulmonary disease, unspecified: Secondary | ICD-10-CM

## 2017-07-24 NOTE — Patient Instructions (Signed)
Patient to follow up in 6 months if needed.  Patient is currently under care of nephrologist.

## 2017-07-24 NOTE — Telephone Encounter (Signed)
Called to confirm appointment time on 7/25

## 2017-07-24 NOTE — Progress Notes (Signed)
   Subjective:    Patient ID: Janet Olson, female    DOB: 13-Aug-1970, 47 y.o.   MRN: [id number]  HPI  Patient Active Problem List   Diagnosis Date Noted  . COPD (chronic obstructive pulmonary disease) ([place]) 06/20/2017  . Anemia in chronic kidney disease 06/07/2017  . CKD (chronic kidney disease) stage 5, GFR less than 15 ml/min ([facility]) 06/07/2017  . 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 Mass of left axilla 12/07/2013   Patient has recently started dialysis and had a port inserted in right upper chest.  Patent states that she is still putting out urine so they are not pulling fluid at this time. Patient states that she has a stomach ulcer and was diagnosed in 2000.  Patient restarted Carafate after recent emergency department visit. Patient is suffering with constipation.  Review of Systems  Lungs are clear    Objective:   Physical Exam  Constitutional: She is oriented to person, place, and time.  Cardiovascular: Normal rate and regular rhythm.   Pulmonary/Chest: Effort normal and breath sounds normal.  Neurological: She is alert and oriented to person, place, and time.    There were no vitals taken for this visit. Allergies as of 07/24/2017   No Known Allergies     Medication List       Accurate as of 07/24/17 11:01 AM. Always use your most recent med list.          albuterol 108 (90 Base) MCG/ACT inhaler Commonly known as:  PROVENTIL HFA;VENTOLIN HFA Inhale 2 puffs into the lungs every 6 (six) hours as needed for wheezing or shortness of breath.   albuterol (2.5 MG/3ML) 0.083% nebulizer solution Commonly known as:  PROVENTIL Take 3 mLs (2.5 mg total) by nebulization 3 (three) times daily.   ALBUTEROL IN Inhale 1-2 puffs into the lungs as needed.   amLODipine 10 MG tablet Commonly known as:  NORVASC Take 1 tablet (10 mg total) by mouth daily.   ferrous sulfate 160 (50 Fe) MG Tbcr SR tablet Commonly known as:  EQL SLOW RELEASE IRON Take 1 tablet  (160 mg total) by mouth 2 (two) times daily.   fluticasone 50 MCG/ACT nasal spray Commonly known as:  FLONASE Place 1 spray into both nostrils daily.   furosemide 40 MG tablet Commonly known as:  LASIX Take 1 tablet (40 mg total) by mouth 2 (two) times daily.   metoprolol tartrate 25 MG tablet Commonly known as:  LOPRESSOR Take 1 tablet (25 mg total) by mouth 2 (two) times daily.   multivitamin tablet Take 1 tablet by mouth daily.   sucralfate 1 g tablet Commonly known as:  CARAFATE Take 1 tablet (1 g total) by mouth 4 (four) times daily.   tiotropium 18 MCG inhalation capsule Commonly known as:  SPIRIVA Place 1 capsule (18 mcg total) into inhaler and inhale 2 (two) times daily.            Assessment & Plan:  Patient to follow up in 6 months if needed while under care with nephrologist. She should eat a high fiber diet including plums, prunes, etc.

## 2017-08-02 ENCOUNTER — Other Ambulatory Visit (INDEPENDENT_AMBULATORY_CARE_PROVIDER_SITE_OTHER): Payer: Self-pay

## 2017-08-02 ENCOUNTER — Other Ambulatory Visit (INDEPENDENT_AMBULATORY_CARE_PROVIDER_SITE_OTHER): Payer: Self-pay | Admitting: Nephrology

## 2017-08-02 ENCOUNTER — Ambulatory Visit (INDEPENDENT_AMBULATORY_CARE_PROVIDER_SITE_OTHER): Payer: Medicaid Other

## 2017-08-02 ENCOUNTER — Ambulatory Visit (INDEPENDENT_AMBULATORY_CARE_PROVIDER_SITE_OTHER): Payer: Medicaid Other | Admitting: Vascular Surgery

## 2017-08-02 ENCOUNTER — Encounter (INDEPENDENT_AMBULATORY_CARE_PROVIDER_SITE_OTHER): Payer: Self-pay

## 2017-08-02 ENCOUNTER — Encounter (INDEPENDENT_AMBULATORY_CARE_PROVIDER_SITE_OTHER): Payer: Self-pay | Admitting: Vascular Surgery

## 2017-08-02 VITALS — BP 132/89 | HR 87 | Resp 17 | Wt 235.0 lb

## 2017-08-02 DIAGNOSIS — N186 End stage renal disease: Secondary | ICD-10-CM

## 2017-08-02 DIAGNOSIS — N185 Chronic kidney disease, stage 5: Secondary | ICD-10-CM

## 2017-08-02 DIAGNOSIS — D631 Anemia in chronic kidney disease: Secondary | ICD-10-CM | POA: Diagnosis not present

## 2017-08-02 DIAGNOSIS — Z992 Dependence on renal dialysis: Secondary | ICD-10-CM

## 2017-08-02 DIAGNOSIS — J449 Chronic obstructive pulmonary disease, unspecified: Secondary | ICD-10-CM | POA: Diagnosis not present

## 2017-08-02 NOTE — Assessment & Plan Note (Signed)
Her vein mapping today shows usable cephalic vein in the upper arm on the left and in the upper and lower arm on the right with triphasic arterial waveforms and bilateral upper extremities. Given these findings, the patient was offered either right radiocephalic AV fistula or a left brachiocephalic AV fistula. She would for use her nondominant arm so a left brachiocephalic AV fistula will be created. Discussed with her this will take 6-8 weeks to be usable. Risks and benefits of the procedure as well as the long-term complications of AV fistulas including steal syndrome and poor maturation were all discussed. She is agreeable to proceed.

## 2017-08-02 NOTE — Progress Notes (Signed)
MRN : [id number]  Janet Olson is a 47 y.o. (Dec 06, 1970) female who presents with chief complaint of  Chief Complaint  Patient presents with  . Follow-up    vein mapping  .  History of Present Illness: Patient returns today in follow up of ESRD. She had a catheter placed and is now undergoing hemodialysis on Mondays, Wednesdays, and Fridays. She is right-hand dominant. Her vein mapping today shows usable cephalic vein in the upper arm on the left and in the upper and lower arm on the right with triphasic arterial waveforms and bilateral upper extremities.  Current Outpatient Prescriptions  Medication Sig Dispense Refill  . albuterol (PROVENTIL HFA;VENTOLIN HFA) 108 (90 Base) MCG/ACT inhaler Inhale 2 puffs into the lungs every 6 (six) hours as needed for wheezing or shortness of breath. 18 g 3  . albuterol (PROVENTIL) (2.5 MG/3ML) 0.083% nebulizer solution Take 3 mLs (2.5 mg total) by nebulization 3 (three) times daily. 75 mL 3  . ALBUTEROL IN Inhale 1-2 puffs into the lungs as needed.    Marland Kitchen amLODipine (NORVASC) 10 MG tablet Take 1 tablet (10 mg total) by mouth daily. 30 tablet 3  . ferrous sulfate (EQL SLOW RELEASE IRON) 160 (50 Fe) MG TBCR SR tablet Take 1 tablet (160 mg total) by mouth 2 (two) times daily. 60 tablet 3  . fluticasone (FLONASE) 50 MCG/ACT nasal spray Place 1 spray into both nostrils daily. 16 g 0  . metoprolol tartrate (LOPRESSOR) 25 MG tablet Take 1 tablet (25 mg total) by mouth 2 (two) times daily. 60 tablet 3  . Multiple Vitamin (MULTIVITAMIN) tablet Take 1 tablet by mouth daily.    Marland Kitchen tiotropium (SPIRIVA) 18 MCG inhalation capsule Place 1 capsule (18 mcg total) into inhaler and inhale 2 (two) times daily. 30 capsule 3  . furosemide (LASIX) 40 MG tablet Take 1 tablet (40 mg total) by mouth 2 (two) times daily. 60 tablet 3  . sucralfate (CARAFATE) 1 g tablet Take 1 tablet (1 g total) by mouth 4 (four) times daily. (Patient not taking: Reported on 08/02/2017) 120  tablet 1   No current facility-administered medications for this visit.     Past Medical History:  Diagnosis Date  . Acute renal failure (ARF) ([place]) 05/17/2017  . Anemia   . Asthma   . Chronic kidney disease   . COPD (chronic obstructive pulmonary disease) ([place])   . GERD (gastroesophageal reflux disease)   . Ulcer     Past Surgical History:  Procedure Laterality Date  . CESAREAN SECTION     x 4  . DIALYSIS/PERMA CATHETER INSERTION N/A 07/16/2017   Procedure: Dialysis/Perma Catheter Insertion;  Surgeon: Katha Cabal, MD;  Location: [facility];  Service: Cardiovascular;  Laterality: N/A;  . excision of lymph nodes Bilateral 2014   bilateral under arms.         Family History  Problem Relation Age of Onset  . Diabetes Mother   No bleeding disorders, clotting disorders, or aneurysms  Social History       Social History  Substance Use Topics  . Smoking status: Current Every Day Smoker    Packs/day: 0.50    Years: 30.00    Types: Cigarettes  . Smokeless tobacco: Never Used  . Alcohol use Yes    Works in the healthcare field  No Known Allergies     REVIEW OF SYSTEMS (Negative unless checked)  Constitutional: '[]' Weight loss  '[]' Fever  '[]' Chills Cardiac: '[]' Chest pain   '[]'   Chest pressure   '[]' Palpitations   '[]' Shortness of breath when laying flat   '[]' Shortness of breath at rest   '[x]' Shortness of breath with exertion. Vascular:  '[]' Pain in legs with walking   '[]' Pain in legs at rest   '[]' Pain in legs when laying flat   '[]' Claudication   '[]' Pain in feet when walking  '[]' Pain in feet at rest  '[]' Pain in feet when laying flat   '[]' History of DVT   '[]' Phlebitis   '[]' Swelling in legs   '[]' Varicose veins   '[]' Non-healing ulcers Pulmonary:   '[]' Uses home oxygen   '[]' Productive cough   '[]' Hemoptysis   '[]' Wheeze  '[]' COPD   '[x]' Asthma Neurologic:  '[]' Dizziness  '[]' Blackouts   '[]' Seizures   '[]' History of stroke   '[]' History of TIA  '[]' Aphasia   '[]' Temporary blindness   '[]' Dysphagia    '[]' Weakness or numbness in arms   '[]' Weakness or numbness in legs Musculoskeletal:  '[]' Arthritis   '[]' Joint swelling   '[]' Joint pain   '[]' Low back pain Hematologic:  '[]' Easy bruising  '[]' Easy bleeding   '[]' Hypercoagulable state   '[x]' Anemic  '[]' Hepatitis Gastrointestinal:  '[]' Blood in stool   '[]' Vomiting blood  '[]' Gastroesophageal reflux/heartburn   '[]' Abdominal pain Genitourinary:  '[x]' Chronic kidney disease   '[]' Difficult urination  '[]' Frequent urination  '[]' Burning with urination   '[]' Hematuria Skin:  '[]' Rashes   '[]' Ulcers   '[]' Wounds Psychological:  '[]' History of anxiety   '[]'  History of major depression.    Physical Examination  BP 132/89   Pulse 87   Resp 17   Wt 235 lb (106.6 kg)   BMI 37.93 kg/m  Gen:  WD/WN, NAD Head: [place]/AT, No temporalis wasting. Ear/Nose/Throat: Hearing grossly intact, nares w/o erythema or drainage, trachea midline Eyes: Conjunctiva clear. Sclera non-icteric Neck: Supple.  No JVD.  Pulmonary:  Good air movement, no use of accessory muscles.  Cardiac: RRR, normal S1, S2 Vascular: PermCath present in right chest Vessel Right Left  Radial Palpable Palpable                                    Musculoskeletal: M/S 5/5 throughout.  No deformity or atrophy. Allen's test is normal bilaterally Neurologic: Sensation grossly intact in extremities.  Symmetrical.  Speech is fluent.  Psychiatric: Judgment intact, Mood & affect appropriate for pt's clinical situation. Dermatologic: No rashes or ulcers noted.  No cellulitis or open wounds.       Labs Recent Results (from the past 2160 hour(s))  Basic metabolic panel     Status: Abnormal   Collection Time: 05/14/17 10:10 AM  Result Value Ref Range   Sodium 138 135 - 145 mmol/L   Potassium 4.4 3.5 - 5.1 mmol/L   Chloride 112 (H) 101 - 111 mmol/L   CO2 20 (L) 22 - 32 mmol/L   Glucose, Bld 107 (H) 65 - 99 mg/dL   BUN 42 (H) 6 - 20 mg/dL   Creatinine, Ser 4.42 (H) 0.44 - 1.00 mg/dL   Calcium 8.3 (L) 8.9 - 10.3 mg/dL   GFR  calc non Af Amer 11 (L) >60 mL/min   GFR calc Af Amer 13 (L) >60 mL/min    Comment: (NOTE) The eGFR has been calculated using the CKD EPI equation. This calculation has not been validated in all clinical situations. eGFR's persistently <60 mL/min signify possible Chronic Kidney Disease.    Anion gap 6 5 - 15  CBC     Status: Abnormal  Collection Time: 05/14/17 10:10 AM  Result Value Ref Range   WBC 6.3 3.6 - 11.0 K/uL   RBC 3.43 (L) 3.80 - 5.20 MIL/uL   Hemoglobin 9.9 (L) 12.0 - 16.0 g/dL   HCT 30.4 (L) 35.0 - 47.0 %   MCV 88.8 80.0 - 100.0 fL   MCH 28.7 26.0 - 34.0 pg   MCHC 32.4 32.0 - 36.0 g/dL   RDW 15.2 (H) 11.5 - 14.5 %   Platelets 371 150 - 440 K/uL  Troponin I     Status: None   Collection Time: 05/14/17 10:10 AM  Result Value Ref Range   Troponin I <0.03 <0.03 ng/mL  Brain natriuretic peptide     Status: Abnormal   Collection Time: 05/14/17 10:10 AM  Result Value Ref Range   B Natriuretic Peptide 104.0 (H) 0.0 - 100.0 pg/mL  Urinalysis, Complete w Microscopic     Status: Abnormal   Collection Time: 05/17/17  2:35 PM  Result Value Ref Range   Color, Urine YELLOW (A) YELLOW   APPearance HAZY (A) CLEAR   Specific Gravity, Urine 1.015 1.005 - 1.030   pH 5.0 5.0 - 8.0   Glucose, UA 50 (A) NEGATIVE mg/dL   Hgb urine dipstick NEGATIVE NEGATIVE   Bilirubin Urine NEGATIVE NEGATIVE   Ketones, ur NEGATIVE NEGATIVE mg/dL   Protein, ur >=300 (A) NEGATIVE mg/dL   Nitrite NEGATIVE NEGATIVE   Leukocytes, UA NEGATIVE NEGATIVE   RBC / HPF 0-5 0 - 5 RBC/hpf   WBC, UA 0-5 0 - 5 WBC/hpf   Bacteria, UA NONE SEEN NONE SEEN   Squamous Epithelial / LPF 0-5 (A) NONE SEEN   Mucous PRESENT   CBC with Differential     Status: Abnormal   Collection Time: 05/17/17  2:36 PM  Result Value Ref Range   WBC 5.4 3.6 - 11.0 K/uL   RBC 3.31 (L) 3.80 - 5.20 MIL/uL   Hemoglobin 9.7 (L) 12.0 - 16.0 g/dL   HCT 29.3 (L) 35.0 - 47.0 %   MCV 88.7 80.0 - 100.0 fL   MCH 29.3 26.0 - 34.0 pg   MCHC  33.1 32.0 - 36.0 g/dL   RDW 14.9 (H) 11.5 - 14.5 %   Platelets 372 150 - 440 K/uL   Neutrophils Relative % 63 %   Neutro Abs 3.4 1.4 - 6.5 K/uL   Lymphocytes Relative 26 %   Lymphs Abs 1.4 1.0 - 3.6 K/uL   Monocytes Relative 7 %   Monocytes Absolute 0.4 0.2 - 0.9 K/uL   Eosinophils Relative 3 %   Eosinophils Absolute 0.1 0 - 0.7 K/uL   Basophils Relative 1 %   Basophils Absolute 0.0 0 - 0.1 K/uL  Comprehensive metabolic panel     Status: Abnormal   Collection Time: 05/17/17  2:36 PM  Result Value Ref Range   Sodium 139 135 - 145 mmol/L   Potassium 3.7 3.5 - 5.1 mmol/L   Chloride 113 (H) 101 - 111 mmol/L   CO2 20 (L) 22 - 32 mmol/L   Glucose, Bld 134 (H) 65 - 99 mg/dL   BUN 49 (H) 6 - 20 mg/dL   Creatinine, Ser 4.40 (H) 0.44 - 1.00 mg/dL   Calcium 8.0 (L) 8.9 - 10.3 mg/dL   Total Protein 5.9 (L) 6.5 - 8.1 g/dL   Albumin 2.3 (L) 3.5 - 5.0 g/dL   AST 14 (L) 15 - 41 U/L   ALT 15 14 - 54 U/L   Alkaline Phosphatase  92 38 - 126 U/L   Total Bilirubin 0.4 0.3 - 1.2 mg/dL   GFR calc non Af Amer 11 (L) >60 mL/min   GFR calc Af Amer 13 (L) >60 mL/min    Comment: (NOTE) The eGFR has been calculated using the CKD EPI equation. This calculation has not been validated in all clinical situations. eGFR's persistently <60 mL/min signify possible Chronic Kidney Disease.    Anion gap 6 5 - 15  Brain natriuretic peptide     Status: None   Collection Time: 05/17/17  2:36 PM  Result Value Ref Range   B Natriuretic Peptide 75.0 0.0 - 100.0 pg/mL  Troponin I     Status: None   Collection Time: 05/17/17  2:36 PM  Result Value Ref Range   Troponin I <0.03 <0.03 ng/mL  hCG, quantitative, pregnancy     Status: None   Collection Time: 05/17/17  2:36 PM  Result Value Ref Range   hCG, Beta Chain, Quant, S 1 <5 mIU/mL    Comment:          GEST. AGE      CONC.  (mIU/mL)   <=1 WEEK        5 - 50     2 WEEKS       50 - 500     3 WEEKS       100 - 10,000     4 WEEKS     1,000 - 30,000     5 WEEKS      3,500 - 115,000   6-8 WEEKS     12,000 - 270,000    12 WEEKS     15,000 - 220,000        FEMALE AND NON-PREGNANT FEMALE:     LESS THAN 5 mIU/mL   Lipid panel     Status: Abnormal   Collection Time: 05/17/17  2:36 PM  Result Value Ref Range   Cholesterol 198 0 - 200 mg/dL   Triglycerides 197 (H) <150 mg/dL   HDL 63 >40 mg/dL   Total CHOL/HDL Ratio 3.1 RATIO   VLDL 39 0 - 40 mg/dL   LDL Cholesterol 96 0 - 99 mg/dL    Comment:        Total Cholesterol/HDL:CHD Risk Coronary Heart Disease Risk Table                     Men   Women  1/2 Average Risk   3.4   3.3  Average Risk       5.0   4.4  2 X Average Risk   9.6   7.1  3 X Average Risk  23.4   11.0        Use the calculated Patient Ratio above and the CHD Risk Table to determine the patient's CHD Risk.        ATP III CLASSIFICATION (LDL):  <100     mg/dL   Optimal  100-129  mg/dL   Near or Above                    Optimal  130-159  mg/dL   Borderline  160-189  mg/dL   High  >190     mg/dL   Very High   Protein electrophoresis, serum     Status: Abnormal   Collection Time: 05/17/17  8:49 PM  Result Value Ref Range   Total Protein ELP 5.3 (L) 6.0 - 8.5 g/dL  Albumin ELP 2.4 (L) 2.9 - 4.4 g/dL   Alpha-1-Globulin 0.2 0.0 - 0.4 g/dL   Alpha-2-Globulin 0.6 0.4 - 1.0 g/dL   Beta Globulin 1.1 0.7 - 1.3 g/dL   Gamma Globulin 1.0 0.4 - 1.8 g/dL   M-Spike, % 0.2 (H) Not Observed g/dL   SPE Interp. Comment     Comment: (NOTE) The SPE pattern demonstrates a single peak (M-spike) in the gamma region which may represent monoclonal protein. This peak may also be caused by circulating immune complexes, cryoglobulins, C-reactive protein, fibrinogen or hemolysis.  If clinically indicated, the presence of a monoclonal gammopathy may be confirmed by immuno- fixation, as well as an evaluation of the urine for the presence of Bence-Jones protein. Performed At: [facility] [place], [place] [id number] Lindon Romp MD VO:[id number]    Comment Comment     Comment: (NOTE) Protein electrophoresis scan will follow via computer, mail, or courier delivery.    GLOBULIN, TOTAL 2.9 2.2 - 3.9 g/dL   A/G Ratio 0.8 0.7 - 1.7  Hemoglobin A1c     Status: Abnormal   Collection Time: 05/17/17  8:49 PM  Result Value Ref Range   Hgb A1c MFr Bld <4.2 (L) 4.8 - 5.6 %    Comment: (NOTE) **Verified by repeat analysis**         Pre-diabetes: 5.7 - 6.4         Diabetes: >6.4         Glycemic control for adults with diabetes: <7.0    Mean Plasma Glucose <74 mg/dL    Comment: (NOTE) Performed At: [facility] [place], [place] [id number] Lindon Romp MD [id number]   HIV antibody (Routine Testing)     Status: None   Collection Time: 05/17/17  8:49 PM  Result Value Ref Range   HIV Screen 4th Generation wRfx Non Reactive Non Reactive    Comment: (NOTE) Performed At: [facility] [place], [place] [id number] Lindon Romp MD MP:[id number]   Basic metabolic panel     Status: Abnormal   Collection Time: 05/18/17  3:48 AM  Result Value Ref Range   Sodium 140 135 - 145 mmol/L   Potassium 4.3 3.5 - 5.1 mmol/L   Chloride 113 (H) 101 - 111 mmol/L   CO2 21 (L) 22 - 32 mmol/L   Glucose, Bld 96 65 - 99 mg/dL   BUN 51 (H) 6 - 20 mg/dL   Creatinine, Ser 4.38 (H) 0.44 - 1.00 mg/dL   Calcium 8.3 (L) 8.9 - 10.3 mg/dL   GFR calc non Af Amer 11 (L) >60 mL/min   GFR calc Af Amer 13 (L) >60 mL/min    Comment: (NOTE) The eGFR has been calculated using the CKD EPI equation. This calculation has not been validated in all clinical situations. eGFR's persistently <60 mL/min signify possible Chronic Kidney Disease.    Anion gap 6 5 - 15  CBC     Status: Abnormal   Collection Time: 05/18/17  3:48 AM  Result Value Ref Range   WBC 7.6 3.6 - 11.0 K/uL   RBC 3.53 (L) 3.80 - 5.20 MIL/uL   Hemoglobin 10.3 (L) 12.0 - 16.0 g/dL   HCT 31.3 (L) 35.0 - 47.0 %   MCV 88.5  80.0 - 100.0 fL   MCH 29.0 26.0 - 34.0 pg   MCHC 32.8 32.0 - 36.0 g/dL   RDW 15.1 (H) 11.5 - 14.5 %   Platelets  416 150 - 440 K/uL  ECHOCARDIOGRAM COMPLETE     Status: None   Collection Time: 05/18/17 10:22 AM  Result Value Ref Range   Weight 3,793.6 oz   Height 66 in   BP 142/96 mmHg  Basic metabolic panel     Status: Abnormal   Collection Time: 05/19/17  4:44 AM  Result Value Ref Range   Sodium 139 135 - 145 mmol/L   Potassium 3.7 3.5 - 5.1 mmol/L   Chloride 113 (H) 101 - 111 mmol/L   CO2 22 22 - 32 mmol/L   Glucose, Bld 98 65 - 99 mg/dL   BUN 46 (H) 6 - 20 mg/dL   Creatinine, Ser 3.80 (H) 0.44 - 1.00 mg/dL   Calcium 8.4 (L) 8.9 - 10.3 mg/dL   GFR calc non Af Amer 13 (L) >60 mL/min   GFR calc Af Amer 15 (L) >60 mL/min    Comment: (NOTE) The eGFR has been calculated using the CKD EPI equation. This calculation has not been validated in all clinical situations. eGFR's persistently <60 mL/min signify possible Chronic Kidney Disease.    Anion gap 4 (L) 5 - 15  Protein / creatinine ratio, urine     Status: Abnormal   Collection Time: 05/20/17 10:51 AM  Result Value Ref Range   Creatinine, Urine 81 mg/dL   Total Protein, Urine 93 mg/dL    Comment: RESULT CONFIRMED BY MANUAL DILUTION NO NORMAL RANGE ESTABLISHED FOR THIS TEST    Protein Creatinine Ratio 1.15 (H) 0.00 - 0.15 mg/mg[Cre]  Comprehensive metabolic panel     Status: Abnormal   Collection Time: 05/20/17 10:55 AM  Result Value Ref Range   Sodium 135 135 - 145 mmol/L   Potassium 4.0 3.5 - 5.1 mmol/L   Chloride 108 101 - 111 mmol/L   CO2 21 (L) 22 - 32 mmol/L   Glucose, Bld 101 (H) 65 - 99 mg/dL   BUN 53 (H) 6 - 20 mg/dL   Creatinine, Ser 4.44 (H) 0.44 - 1.00 mg/dL   Calcium 8.5 (L) 8.9 - 10.3 mg/dL   Total Protein 6.6 6.5 - 8.1 g/dL   Albumin 2.5 (L) 3.5 - 5.0 g/dL   AST 14 (L) 15 - 41 U/L   ALT 17 14 - 54 U/L   Alkaline Phosphatase 94 38 - 126 U/L   Total Bilirubin 0.6 0.3 - 1.2 mg/dL   GFR calc non Af Amer  11 (L) >60 mL/min   GFR calc Af Amer 12 (L) >60 mL/min    Comment: (NOTE) The eGFR has been calculated using the CKD EPI equation. This calculation has not been validated in all clinical situations. eGFR's persistently <60 mL/min signify possible Chronic Kidney Disease.    Anion gap 6 5 - 15  Parathyroid hormone, intact (no Ca)     Status: Abnormal   Collection Time: 05/20/17 10:55 AM  Result Value Ref Range   PTH 107 (H) 15 - 65 pg/mL    Comment: (NOTE) Performed At: [facility] [address] [place], [place] [id number] Lindon Romp MD EK:[id number]   Phosphorus     Status: None   Collection Time: 05/20/17 10:55 AM  Result Value Ref Range   Phosphorus 3.8 2.5 - 4.6 mg/dL  Protein / creatinine ratio, urine     Status: Abnormal   Collection Time: 06/10/17  3:28 PM  Result Value Ref Range   Creatinine, Urine 228 mg/dL   Total Protein, Urine 940 mg/dL    Comment:  RESULT CONFIRMED BY MANUAL DILUTION.MSS NO NORMAL RANGE ESTABLISHED FOR THIS TEST    Protein Creatinine Ratio 4.12 (H) 0.00 - 0.15 mg/mg[Cre]  Comprehensive metabolic panel     Status: Abnormal   Collection Time: 06/10/17  3:33 PM  Result Value Ref Range   Sodium 133 (L) 135 - 145 mmol/L   Potassium 3.8 3.5 - 5.1 mmol/L   Chloride 106 101 - 111 mmol/L   CO2 19 (L) 22 - 32 mmol/L   Glucose, Bld 111 (H) 65 - 99 mg/dL   BUN 63 (H) 6 - 20 mg/dL   Creatinine, Ser 4.63 (H) 0.44 - 1.00 mg/dL   Calcium 8.3 (L) 8.9 - 10.3 mg/dL   Total Protein 6.7 6.5 - 8.1 g/dL   Albumin 2.8 (L) 3.5 - 5.0 g/dL   AST 12 (L) 15 - 41 U/L   ALT 17 14 - 54 U/L   Alkaline Phosphatase 81 38 - 126 U/L   Total Bilirubin 0.2 (L) 0.3 - 1.2 mg/dL   GFR calc non Af Amer 10 (L) >60 mL/min   GFR calc Af Amer 12 (L) >60 mL/min    Comment: (NOTE) The eGFR has been calculated using the CKD EPI equation. This calculation has not been validated in all clinical situations. eGFR's persistently <60 mL/min signify possible Chronic  Kidney Disease.    Anion gap 8 5 - 15  CBC with Differential/Platelet     Status: Abnormal   Collection Time: 06/10/17  3:33 PM  Result Value Ref Range   WBC 7.3 3.6 - 11.0 K/uL   RBC 3.39 (L) 3.80 - 5.20 MIL/uL   Hemoglobin 9.8 (L) 12.0 - 16.0 g/dL   HCT 30.6 (L) 35.0 - 47.0 %   MCV 90.2 80.0 - 100.0 fL   MCH 28.9 26.0 - 34.0 pg   MCHC 32.0 32.0 - 36.0 g/dL   RDW 14.7 (H) 11.5 - 14.5 %   Platelets 327 150 - 440 K/uL   Neutrophils Relative % 63 %   Neutro Abs 4.6 1.4 - 6.5 K/uL   Lymphocytes Relative 26 %   Lymphs Abs 1.9 1.0 - 3.6 K/uL   Monocytes Relative 7 %   Monocytes Absolute 0.5 0.2 - 0.9 K/uL   Eosinophils Relative 3 %   Eosinophils Absolute 0.2 0 - 0.7 K/uL   Basophils Relative 1 %   Basophils Absolute 0.1 0 - 0.1 K/uL  Comprehensive metabolic panel     Status: Abnormal   Collection Time: 07/11/17 11:03 AM  Result Value Ref Range   Sodium 139 135 - 145 mmol/L   Potassium 4.6 3.5 - 5.1 mmol/L   Chloride 110 101 - 111 mmol/L   CO2 20 (L) 22 - 32 mmol/L   Glucose, Bld 107 (H) 65 - 99 mg/dL   BUN 58 (H) 6 - 20 mg/dL   Creatinine, Ser 4.82 (H) 0.44 - 1.00 mg/dL   Calcium 8.8 (L) 8.9 - 10.3 mg/dL   Total Protein 7.0 6.5 - 8.1 g/dL   Albumin 3.0 (L) 3.5 - 5.0 g/dL   AST 12 (L) 15 - 41 U/L   ALT 19 14 - 54 U/L   Alkaline Phosphatase 88 38 - 126 U/L   Total Bilirubin 0.3 0.3 - 1.2 mg/dL   GFR calc non Af Amer 10 (L) >60 mL/min   GFR calc Af Amer 11 (L) >60 mL/min    Comment: (NOTE) The eGFR has been calculated using the CKD EPI equation. This calculation has not been  validated in all clinical situations. eGFR's persistently <60 mL/min signify possible Chronic Kidney Disease.    Anion gap 9 5 - 15  CBC with Differential/Platelet     Status: Abnormal   Collection Time: 07/11/17 11:03 AM  Result Value Ref Range   WBC 7.2 3.6 - 11.0 K/uL   RBC 3.76 (L) 3.80 - 5.20 MIL/uL   Hemoglobin 11.0 (L) 12.0 - 16.0 g/dL   HCT 33.7 (L) 35.0 - 47.0 %   MCV 89.6 80.0 - 100.0  fL   MCH 29.2 26.0 - 34.0 pg   MCHC 32.6 32.0 - 36.0 g/dL   RDW 14.2 11.5 - 14.5 %   Platelets 387 150 - 440 K/uL   Neutrophils Relative % 64 %   Neutro Abs 4.6 1.4 - 6.5 K/uL   Lymphocytes Relative 25 %   Lymphs Abs 1.8 1.0 - 3.6 K/uL   Monocytes Relative 6 %   Monocytes Absolute 0.4 0.2 - 0.9 K/uL   Eosinophils Relative 4 %   Eosinophils Absolute 0.3 0 - 0.7 K/uL   Basophils Relative 1 %   Basophils Absolute 0.1 0 - 0.1 K/uL  Iron and TIBC     Status: None   Collection Time: 07/11/17 11:03 AM  Result Value Ref Range   Iron 66 28 - 170 ug/dL   TIBC 303 250 - 450 ug/dL   Saturation Ratios 22 10.4 - 31.8 %   UIBC 237 ug/dL  Phosphorus     Status: None   Collection Time: 07/11/17 11:03 AM  Result Value Ref Range   Phosphorus 4.3 2.5 - 4.6 mg/dL  Parathyroid hormone, intact (no Ca)     Status: Abnormal   Collection Time: 07/11/17 11:03 AM  Result Value Ref Range   PTH 161 (H) 15 - 65 pg/mL    Comment: (NOTE) Performed At: [facility] [place], [place] [id number] Lindon Romp MD [id number]   Hepatitis B surface antigen     Status: None   Collection Time: 07/11/17 11:03 AM  Result Value Ref Range   Hepatitis B Surface Ag Negative Negative    Comment: (NOTE) Performed At: [facility] [place], [place] [id number] Lindon Romp MD PY:[id number]   Hepatitis B surface antibody     Status: Abnormal   Collection Time: 07/11/17 11:03 AM  Result Value Ref Range   Hepatitis B-Post <3.1 (L) Immunity>9.9 mIU/mL    Comment: (NOTE)  Status of Immunity                     Anti-HBs Level  ------------------                     -------------- Inconsistent with Immunity                   0.0 - 9.9 Consistent with Immunity                          >9.9 Performed At: [facility] [place], [place] [id number] Lindon Romp MD [id number]   Quantiferon tb gold assay (blood)     Status: None   Collection  Time: 07/11/17 11:03 AM  Result Value Ref Range   QUANTIFERON INCUBATION Comment     Comment: (NOTE) Specimen incubated at [place], [place], [place]. Performed At: [facility] [place], [place] [id number] Lindon Romp MD OX:[id number]  QuantiFERON In Tube     Status: None   Collection Time: 07/11/17 11:03 AM  Result Value Ref Range   QUANTIFERON TB GOLD Negative Negative   QUANTIFERON CRITERIA Comment     Comment: (NOTE) To be considered positive a specimen should have a TB Ag minus Nil value greater than or equal to 0.35 IU/mL and in addition the TB Ag minus Nil value must be greater than or equal to 25% of the Nil value. There may be insufficient information in these values to differentiate between some negative and some indeterminate test values.    QUANTIFERON TB AG VALUE 0.03 IU/mL   Quantiferon Nil Value 0.04 IU/mL   QUANTIFERON MITOGEN VALUE 1.67 IU/mL   QFT TB AG MINUS NIL VALUE <0.00 IU/mL   Interpretation: Comment     Comment: (NOTE) The QuantiFERON TB Gold (in Tube) assay is intended for use as an aid in the diagnosis of TB infection. Negative results suggest that there is no TB infection. In patients with high suspicion of exposure, a negative test should be repeated. A positive test indicates infection with Mycobacterium tuberculosis. Among individuals without tuberculosis infection, a positive test may be due to exposure to [place], M. szulgai or M. marinum. On the Internet, go to https://figueroa-lambert.info/ for further details. Performed At: [facility] [place], [place] [id number] Lindon Romp MD QF:[id number]   Potassium [facility] vascular lab only)     Status: None   Collection Time: 07/16/17  2:00 PM  Result Value Ref Range   Potassium [facility] vascular lab) 4.5 3.5 - 5.1    Radiology No results found.    Assessment/Plan Hypertension blood pressure control important in reducing the progression of atherosclerotic  disease. Likely a cause of her renal failure. On appropriate oral medications.   Anemia in chronic kidney disease Hgb was 10. No need for transfusion at this point.  ESRD on dialysis [facility]) Her vein mapping today shows usable cephalic vein in the upper arm on the left and in the upper and lower arm on the right with triphasic arterial waveforms and bilateral upper extremities. Given these findings, the patient was offered either right radiocephalic AV fistula or a left brachiocephalic AV fistula. She would for use her nondominant arm so a left brachiocephalic AV fistula will be created. Discussed with her this will take 6-8 weeks to be usable. Risks and benefits of the procedure as well as the long-term complications of AV fistulas including steal syndrome and poor maturation were all discussed. She is agreeable to proceed.    Leotis Pain, MD  08/02/2017 4:42 PM    This note was created with Dragon medical transcription system.  Any errors from dictation are purely unintentional

## 2017-08-02 NOTE — Patient Instructions (Signed)
AV Fistula Placement, Care After °Refer to this sheet in the next few weeks. These instructions provide you with information about caring for yourself after your procedure. Your health care provider may also give you more specific instructions. Your treatment has been planned according to current medical practices, but problems sometimes occur. Call your health care provider if you have any problems or questions after your procedure. °What can I expect after the procedure? °After your procedure, it is common to: °· Feel sore. °· Have numbness. °· Feel cold. °· Feel a vibration (thrill) over the fistula. ° °Follow these instructions at home: ° °Incision care °· Do not take baths or showers, swim, or use a hot tub until your health care provider approves. °· Keep the area around your cut from surgery (incision) clean and dry. °· Follow instructions from your health care provider about how to take care of your incision. Make sure you: °? Wash your hands with soap and water before you change your bandage (dressing). If soap and water are not available, use hand sanitizer. °? Change your dressing as told by your health care provider. °? Leave stitches (sutures) and clips in place. They may need to stay in place for 2 weeks or longer. °Fistula Care °· Check your fistula site every day to make sure the thrill feels the same. °· Check your fistula site every day for signs of infection. Watch for: °? Redness. °? Swelling. °? Discharge. °? Tenderness. °? Enlargement. °· Keep your arm raised (elevated) while you rest. °· Do not lift anything that is heavier than a gallon of milk with the arm that has the fistula. °· Do not lie down on your fistula arm. °· Do not let anyone draw blood or take a blood pressure reading on your fistula arm. °· Do not wear tight jewelry or clothing over your fistula arm. °General instructions °· Rest at home for a day or two. °· Gradually start doing your usual activities again. Ask your surgeon  when you can return to work or school. °· Take over-the-counter and prescription medicines only as told by your health care provider. °· Keep all follow-up visits as told by your health care provider. This is important. °Contact a health care provider if: °· You have chills or a fever. °· You have pain at your fistula site that is not going away. °· You have numbness or coldness at your fistula site that is not going away. °· You feel a decrease or a change in the thrill. °· You have swelling in your arm or hand. °· You have redness, swelling, discharge, tenderness, or enlargement at your fistula site. °Get help right away if: °· You are bleeding from your fistula site. °· You have chest pain. °· You have trouble breathing. °This information is not intended to replace advice given to you by your health care provider. Make sure you discuss any questions you have with your health care provider. °Document Released: 12/17/2005 Document Revised: 04/25/2016 Document Reviewed: 03/09/2015 °Elsevier Interactive Patient Education © 2018 Elsevier Inc. ° °

## 2017-08-06 ENCOUNTER — Encounter (INDEPENDENT_AMBULATORY_CARE_PROVIDER_SITE_OTHER): Payer: MEDICAID

## 2017-08-06 ENCOUNTER — Ambulatory Visit (INDEPENDENT_AMBULATORY_CARE_PROVIDER_SITE_OTHER): Payer: MEDICAID | Admitting: Vascular Surgery

## 2017-08-06 ENCOUNTER — Encounter
Admission: RE | Admit: 2017-08-06 | Discharge: 2017-08-06 | Disposition: A | Discharge status: Home / Self Care | Payer: Medicaid Other | Source: Ambulatory Visit | Attending: Vascular Surgery | Admitting: Vascular Surgery

## 2017-08-06 DIAGNOSIS — Z01812 Encounter for preprocedural laboratory examination: Secondary | ICD-10-CM | POA: Insufficient documentation

## 2017-08-06 DIAGNOSIS — N186 End stage renal disease: Secondary | ICD-10-CM | POA: Insufficient documentation

## 2017-08-06 DIAGNOSIS — Z0183 Encounter for blood typing: Secondary | ICD-10-CM | POA: Insufficient documentation

## 2017-08-06 HISTORY — DX: Dependence on renal dialysis: Z99.2

## 2017-08-06 HISTORY — DX: Dyspnea, unspecified: R06.00

## 2017-08-06 HISTORY — DX: Headache: R51

## 2017-08-06 HISTORY — DX: Headache, unspecified: R51.9

## 2017-08-06 LAB — CBC
HEMATOCRIT: 29.8 % — AB (ref 35.0–47.0)
HEMOGLOBIN: 10.1 g/dL — AB (ref 12.0–16.0)
MCH: 29.4 pg (ref 26.0–34.0)
MCHC: 33.7 g/dL (ref 32.0–36.0)
MCV: 87.4 fL (ref 80.0–100.0)
PLATELETS: 319 10*3/uL (ref 150–440)
RBC: 3.41 MIL/uL — AB (ref 3.80–5.20)
RDW: 13.9 % (ref 11.5–14.5)
WBC: 7.1 10*3/uL (ref 3.6–11.0)

## 2017-08-06 LAB — PROTIME-INR
INR: 0.93
PROTHROMBIN TIME: 12.5 s (ref 11.4–15.2)

## 2017-08-06 LAB — DIFFERENTIAL
Basophils Absolute: 0 10*3/uL (ref 0–0.1)
Basophils Relative: 1 %
EOS PCT: 3 %
Eosinophils Absolute: 0.2 10*3/uL (ref 0–0.7)
LYMPHS ABS: 1.8 10*3/uL (ref 1.0–3.6)
LYMPHS PCT: 25 %
MONO ABS: 0.6 10*3/uL (ref 0.2–0.9)
MONOS PCT: 8 %
NEUTROS ABS: 4.5 10*3/uL (ref 1.4–6.5)
Neutrophils Relative %: 63 %

## 2017-08-06 LAB — APTT: aPTT: 30 seconds (ref 24–36)

## 2017-08-06 LAB — COMPREHENSIVE METABOLIC PANEL
ALT: 21 U/L (ref 14–54)
AST: 14 U/L — ABNORMAL LOW (ref 15–41)
Albumin: 2.8 g/dL — ABNORMAL LOW (ref 3.5–5.0)
Alkaline Phosphatase: 70 U/L (ref 38–126)
Anion gap: 10 (ref 5–15)
BUN: 35 mg/dL — ABNORMAL HIGH (ref 6–20)
CO2: 27 mmol/L (ref 22–32)
CREATININE: 3.55 mg/dL — AB (ref 0.44–1.00)
Calcium: 8.5 mg/dL — ABNORMAL LOW (ref 8.9–10.3)
Chloride: 104 mmol/L (ref 101–111)
GFR, EST AFRICAN AMERICAN: 16 mL/min — AB (ref >60)
GFR, EST NON AFRICAN AMERICAN: 14 mL/min — AB (ref >60)
Glucose, Bld: 95 mg/dL (ref 65–99)
Potassium: 3.6 mmol/L (ref 3.5–5.1)
SODIUM: 141 mmol/L (ref 135–145)
Total Bilirubin: 0.4 mg/dL (ref 0.3–1.2)
Total Protein: 6.5 g/dL (ref 6.5–8.1)

## 2017-08-06 LAB — SURGICAL PCR SCREEN
MRSA, PCR: NEGATIVE
Staphylococcus aureus: NEGATIVE

## 2017-08-06 NOTE — Patient Instructions (Signed)
  Your procedure is scheduled on: August 08, 2017 [facility]) Report to Same Day Surgery 2nd floor medical mall ([place] Entrance-take elevator on left to 2nd floor.  Check in with surgery information desk.) To find out your arrival time please call [phone] between 1PM - 3PM on August 07, 2017 [facility])   Remember: Instructions that are not followed completely may result in serious medical risk, up to and including death, or upon the discretion of your surgeon and anesthesiologist your surgery may need to be rescheduled.    _x___ 1. Do not eat food or drink liquids after midnight. No gum chewing or hard candies                              __x__ 2. No Alcohol for 24 hours before or after surgery.   __x__3. No Smoking for 24 prior to surgery.   ____  4. Bring all medications with you on the day of surgery if instructed.    __x__ 5. Notify your doctor if there is any change in your medical condition     (cold, fever, infections).     Do not wear jewelry, make-up, hairpins, clips or nail polish.  Do not wear lotions, powders, or perfumes.   Do not shave 48 hours prior to surgery. Men may shave face and neck.  Do not bring valuables to the hospital.    [facility] is not responsible for any belongings or valuables.               Contacts, dentures or bridgework may not be worn into surgery.  Leave your suitcase in the car. After surgery it may be brought to your room.  For patients admitted to the hospital, discharge time is determined by your treatment team                       Patients discharged the day of surgery will not be allowed to drive home.  You will need someone to drive you home and stay with you the night of your procedure.    Please read over the following fact sheets that you were given:   [facility] Preparing for Surgery and or MRSA Information   TAKE THE FOLLOWING MEDICATIONS THE MORNING OF SURGERY WITH A SIP OF WATER :   1. AMLODIPINE  2.  METOPROLOL  .  ____Fleets enema or Magnesium Citrate as directed.   _x___ Use CHG Soap or sage wipes as directed on instruction sheet   _X___ Use inhalers on the day of surgery and bring to hospital day of surgery (USE ALBUTEROL NEBULIZER [place] ALBUTEROL [place]  )  ____ Stop Metformin and Janumet 2 days prior to surgery.    ____ Take 1/2 of usual insulin dose the night before surgery and none on the morning surgery   _x___ Follow recommendations from Cardiologist, Pulmonologist or PCP regarding          stopping Aspirin, Coumadin, Plavix ,Eliquis, Effient, or Pradaxa, and Pletal.  X____Stop Anti-inflammatories such as Advil, Aleve, Ibuprofen, Motrin, Naproxen, Naprosyn, Goodies powders or aspirin products. OK to take Tylenol  _x___ Stop supplements until after surgery.  But may continue Vitamin D, Vitamin B,  and multivitamin       ____ Bring C-Pap to the hospital.

## 2017-08-08 ENCOUNTER — Ambulatory Visit
Admission: RE | Admit: 2017-08-08 | Discharge: 2017-08-08 | Disposition: A | Discharge status: Home / Self Care | Payer: Medicaid Other | Source: Ambulatory Visit | Attending: Vascular Surgery | Admitting: Vascular Surgery

## 2017-08-08 ENCOUNTER — Ambulatory Visit: Payer: Medicaid Other | Admitting: Registered Nurse

## 2017-08-08 ENCOUNTER — Encounter
Admission: RE | Disposition: A | Discharge status: Home / Self Care | Payer: Self-pay | Source: Ambulatory Visit | Attending: Vascular Surgery

## 2017-08-08 ENCOUNTER — Encounter: Payer: Self-pay | Admitting: *Deleted

## 2017-08-08 DIAGNOSIS — K219 Gastro-esophageal reflux disease without esophagitis: Secondary | ICD-10-CM | POA: Insufficient documentation

## 2017-08-08 DIAGNOSIS — Z79899 Other long term (current) drug therapy: Secondary | ICD-10-CM | POA: Diagnosis not present

## 2017-08-08 DIAGNOSIS — J449 Chronic obstructive pulmonary disease, unspecified: Secondary | ICD-10-CM | POA: Insufficient documentation

## 2017-08-08 DIAGNOSIS — Z6837 Body mass index (BMI) 37.0-37.9, adult: Secondary | ICD-10-CM | POA: Insufficient documentation

## 2017-08-08 DIAGNOSIS — E669 Obesity, unspecified: Secondary | ICD-10-CM | POA: Insufficient documentation

## 2017-08-08 DIAGNOSIS — F17210 Nicotine dependence, cigarettes, uncomplicated: Secondary | ICD-10-CM | POA: Diagnosis not present

## 2017-08-08 DIAGNOSIS — N179 Acute kidney failure, unspecified: Secondary | ICD-10-CM | POA: Insufficient documentation

## 2017-08-08 DIAGNOSIS — I120 Hypertensive chronic kidney disease with stage 5 chronic kidney disease or end stage renal disease: Secondary | ICD-10-CM | POA: Diagnosis present

## 2017-08-08 DIAGNOSIS — Z992 Dependence on renal dialysis: Secondary | ICD-10-CM | POA: Diagnosis not present

## 2017-08-08 DIAGNOSIS — D631 Anemia in chronic kidney disease: Secondary | ICD-10-CM | POA: Diagnosis not present

## 2017-08-08 DIAGNOSIS — N186 End stage renal disease: Secondary | ICD-10-CM | POA: Diagnosis not present

## 2017-08-08 HISTORY — PX: AV FISTULA PLACEMENT: SHX1204

## 2017-08-08 LAB — ABO/RH: ABO/RH(D): O POS

## 2017-08-08 LAB — POCT I-STAT 4, (NA,K, GLUC, HGB,HCT)
GLUCOSE: 90 mg/dL (ref 65–99)
HEMATOCRIT: 25 % — AB (ref 36.0–46.0)
Hemoglobin: 8.5 g/dL — ABNORMAL LOW (ref 12.0–15.0)
Potassium: 3.8 mmol/L (ref 3.5–5.1)
SODIUM: 140 mmol/L (ref 135–145)

## 2017-08-08 LAB — POCT PREGNANCY, URINE: Preg Test, Ur: NEGATIVE

## 2017-08-08 SURGERY — ARTERIOVENOUS (AV) FISTULA CREATION
Anesthesia: General | Laterality: Left

## 2017-08-08 MED ORDER — PROMETHAZINE HCL 25 MG/ML IJ SOLN
6.2500 mg | INTRAMUSCULAR | Status: DC | PRN
  Administered 2017-08-08: 6.25 mg via INTRAVENOUS

## 2017-08-08 MED ORDER — CEFAZOLIN SODIUM-DEXTROSE 2-4 GM/100ML-% IV SOLN: 2.0000 g | Freq: Once | INTRAVENOUS | Status: DC

## 2017-08-08 MED ORDER — LIDOCAINE HCL 2 % EX GEL
CUTANEOUS | Status: AC
  Filled 2017-08-08: qty 5

## 2017-08-08 MED ORDER — SODIUM CHLORIDE 0.9 % IV SOLN
INTRAVENOUS | Status: DC
  Administered 2017-08-08: 15:00:00 via INTRAVENOUS

## 2017-08-08 MED ORDER — LIDOCAINE HCL (PF) 1 % IJ SOLN
INTRAMUSCULAR | Status: AC
  Filled 2017-08-08: qty 5

## 2017-08-08 MED ORDER — MIDAZOLAM HCL 2 MG/2ML IJ SOLN
INTRAMUSCULAR | Status: AC
  Filled 2017-08-08: qty 2

## 2017-08-08 MED ORDER — PROPOFOL 10 MG/ML IV BOLUS
INTRAVENOUS | Status: DC | PRN
  Administered 2017-08-08: 30 mg via INTRAVENOUS

## 2017-08-08 MED ORDER — ROPIVACAINE HCL 5 MG/ML IJ SOLN
INTRAMUSCULAR | Status: DC | PRN
  Administered 2017-08-08: 30 mL via PERINEURAL

## 2017-08-08 MED ORDER — DEXAMETHASONE SODIUM PHOSPHATE 10 MG/ML IJ SOLN
INTRAMUSCULAR | Status: AC
  Filled 2017-08-08: qty 1

## 2017-08-08 MED ORDER — ONDANSETRON HCL 4 MG/2ML IJ SOLN
INTRAMUSCULAR | Status: AC
  Filled 2017-08-08: qty 2

## 2017-08-08 MED ORDER — FENTANYL CITRATE (PF) 100 MCG/2ML IJ SOLN: 25.0000 ug | INTRAMUSCULAR | Status: DC | PRN

## 2017-08-08 MED ORDER — CEFAZOLIN SODIUM-DEXTROSE 2-4 GM/100ML-% IV SOLN
INTRAVENOUS | Status: AC
  Filled 2017-08-08: qty 100

## 2017-08-08 MED ORDER — LIDOCAINE HCL (PF) 2 % IJ SOLN
INTRAMUSCULAR | Status: AC
  Filled 2017-08-08: qty 2

## 2017-08-08 MED ORDER — ROPIVACAINE HCL 5 MG/ML IJ SOLN
INTRAMUSCULAR | Status: AC
  Filled 2017-08-08: qty 30

## 2017-08-08 MED ORDER — HEPARIN SODIUM (PORCINE) 5000 UNIT/ML IJ SOLN
INTRAMUSCULAR | Status: AC
  Filled 2017-08-08: qty 1

## 2017-08-08 MED ORDER — HYDROCODONE-ACETAMINOPHEN 5-325 MG PO TABS: 1.0000 | ORAL_TABLET | Freq: Four times a day (QID) | ORAL | 0 refills | Status: DC | PRN

## 2017-08-08 MED ORDER — HEPARIN SODIUM (PORCINE) 1000 UNIT/ML IJ SOLN
INTRAMUSCULAR | Status: DC | PRN
  Administered 2017-08-08: 3000 [IU] via INTRAVENOUS

## 2017-08-08 MED ORDER — SODIUM CHLORIDE 0.9 % IJ SOLN
INTRAMUSCULAR | Status: AC
  Filled 2017-08-08: qty 10

## 2017-08-08 MED ORDER — PROPOFOL 10 MG/ML IV BOLUS
INTRAVENOUS | Status: AC
  Filled 2017-08-08: qty 20

## 2017-08-08 MED ORDER — OXYCODONE HCL 5 MG/5ML PO SOLN: 5.0000 mg | Freq: Once | ORAL | Status: AC | PRN

## 2017-08-08 MED ORDER — LIDOCAINE HCL 2 % EX GEL
CUTANEOUS | Status: DC | PRN
  Administered 2017-08-08: 1 via TOPICAL

## 2017-08-08 MED ORDER — BUPIVACAINE-EPINEPHRINE (PF) 0.5% -1:200000 IJ SOLN
INTRAMUSCULAR | Status: AC
  Filled 2017-08-08: qty 30

## 2017-08-08 MED ORDER — LIDOCAINE HCL (PF) 2 % IJ SOLN
INTRAMUSCULAR | Status: DC | PRN
  Administered 2017-08-08: 40 mg via INTRADERMAL

## 2017-08-08 MED ORDER — MIDAZOLAM HCL 2 MG/2ML IJ SOLN
INTRAMUSCULAR | Status: DC | PRN
  Administered 2017-08-08 (×2): 1 mg via INTRAVENOUS

## 2017-08-08 MED ORDER — FENTANYL CITRATE (PF) 100 MCG/2ML IJ SOLN
INTRAMUSCULAR | Status: AC
  Filled 2017-08-08: qty 2

## 2017-08-08 MED ORDER — FAMOTIDINE 20 MG PO TABS
20.0000 mg | ORAL_TABLET | Freq: Once | ORAL | Status: AC
  Administered 2017-08-08: 20 mg via ORAL

## 2017-08-08 MED ORDER — FENTANYL CITRATE (PF) 100 MCG/2ML IJ SOLN
INTRAMUSCULAR | Status: DC | PRN
  Administered 2017-08-08: 50 ug via INTRAVENOUS
  Administered 2017-08-08 (×2): 25 ug via INTRAVENOUS

## 2017-08-08 MED ORDER — OXYCODONE HCL 5 MG PO TABS
5.0000 mg | ORAL_TABLET | Freq: Once | ORAL | Status: AC | PRN
  Administered 2017-08-08: 5 mg via ORAL

## 2017-08-08 MED ORDER — PROPOFOL 500 MG/50ML IV EMUL
INTRAVENOUS | Status: DC | PRN
  Administered 2017-08-08: 150 ug/kg/min via INTRAVENOUS

## 2017-08-08 MED ORDER — PAPAVERINE HCL 30 MG/ML IJ SOLN
INTRAMUSCULAR | Status: AC
  Filled 2017-08-08: qty 2

## 2017-08-08 MED ORDER — FAMOTIDINE 20 MG PO TABS
ORAL_TABLET | ORAL | Status: AC
  Administered 2017-08-08: 20 mg via ORAL
  Filled 2017-08-08: qty 1

## 2017-08-08 MED ORDER — SODIUM CHLORIDE 0.9 % IV SOLN
INTRAVENOUS | Status: DC
  Administered 2017-08-08: 20 mL/h via INTRAVENOUS

## 2017-08-08 MED ORDER — PROMETHAZINE HCL 25 MG/ML IJ SOLN
INTRAMUSCULAR | Status: AC
  Administered 2017-08-08: 6.25 mg via INTRAVENOUS
  Filled 2017-08-08: qty 1

## 2017-08-08 MED ORDER — LIDOCAINE HCL (PF) 1 % IJ SOLN
INTRAMUSCULAR | Status: DC | PRN
  Administered 2017-08-08: 3 mL

## 2017-08-08 MED ORDER — PROPOFOL 500 MG/50ML IV EMUL
INTRAVENOUS | Status: AC
  Filled 2017-08-08: qty 100

## 2017-08-08 MED ORDER — PHENYLEPHRINE HCL 10 MG/ML IJ SOLN
INTRAMUSCULAR | Status: DC | PRN
  Administered 2017-08-08 (×3): 100 ug via INTRAVENOUS
  Administered 2017-08-08: 200 ug via INTRAVENOUS

## 2017-08-08 MED ORDER — MEPERIDINE HCL 50 MG/ML IJ SOLN: 6.2500 mg | INTRAMUSCULAR | Status: DC | PRN

## 2017-08-08 MED ORDER — OXYCODONE HCL 5 MG PO TABS
ORAL_TABLET | ORAL | Status: AC
  Filled 2017-08-08: qty 1

## 2017-08-08 SURGICAL SUPPLY — 50 items
BAG DECANTER FOR FLEXI CONT (MISCELLANEOUS) ×3 IMPLANT
BLADE SURG SZ11 CARB STEEL (BLADE) ×3 IMPLANT
BOOT SUTURE AID YELLOW STND (SUTURE) ×3 IMPLANT
BRUSH SCRUB 4% CHG (MISCELLANEOUS) ×3 IMPLANT
CANISTER SUCT 1200ML W/VALVE (MISCELLANEOUS) ×3 IMPLANT
CHLORAPREP W/TINT 26ML (MISCELLANEOUS) ×3 IMPLANT
CLIP SPRNG 6MM S-JAW DBL (CLIP) ×3
DERMABOND ADVANCED (GAUZE/BANDAGES/DRESSINGS) ×2
DERMABOND ADVANCED .7 DNX12 (GAUZE/BANDAGES/DRESSINGS) ×1 IMPLANT
ELECT CAUTERY BLADE 6.4 (BLADE) ×3 IMPLANT
ELECT REM PT RETURN 9FT ADLT (ELECTROSURGICAL) ×3
ELECTRODE REM PT RTRN 9FT ADLT (ELECTROSURGICAL) ×1 IMPLANT
GEL ULTRASOUND 20GR AQUASONIC (MISCELLANEOUS) IMPLANT
GLOVE BIO SURGEON STRL SZ7 (GLOVE) ×6 IMPLANT
GLOVE INDICATOR 7.5 STRL GRN (GLOVE) ×3 IMPLANT
GOWN STRL REUS W/ TWL LRG LVL3 (GOWN DISPOSABLE) ×2 IMPLANT
GOWN STRL REUS W/ TWL XL LVL3 (GOWN DISPOSABLE) ×1 IMPLANT
GOWN STRL REUS W/TWL LRG LVL3 (GOWN DISPOSABLE) ×4
GOWN STRL REUS W/TWL XL LVL3 (GOWN DISPOSABLE) ×2
HEMOSTAT SURGICEL 2X3 (HEMOSTASIS) ×3 IMPLANT
IV NS 500ML (IV SOLUTION) ×2
IV NS 500ML BAXH (IV SOLUTION) ×1 IMPLANT
KIT RM TURNOVER STRD PROC AR (KITS) ×3 IMPLANT
LABEL OR SOLS (LABEL) ×3 IMPLANT
LOOP RED MAXI  1X406MM (MISCELLANEOUS) ×2
LOOP VESSEL MAXI 1X406 RED (MISCELLANEOUS) ×1 IMPLANT
LOOP VESSEL MINI 0.8X406 BLUE (MISCELLANEOUS) ×1 IMPLANT
LOOPS BLUE MINI 0.8X406MM (MISCELLANEOUS) ×2
NEEDLE FILTER BLUNT 18X 1/2SAF (NEEDLE) ×2
NEEDLE FILTER BLUNT 18X1 1/2 (NEEDLE) ×1 IMPLANT
NEEDLE HYPO 30X.5 LL (NEEDLE) IMPLANT
NS IRRIG 500ML POUR BTL (IV SOLUTION) ×3 IMPLANT
PACK EXTREMITY ARMC (MISCELLANEOUS) ×3 IMPLANT
PAD PREP 24X41 OB/GYN DISP (PERSONAL CARE ITEMS) ×3 IMPLANT
SOLUTION CELL SAVER (CLIP) ×1 IMPLANT
STOCKINETTE STRL 4IN 9604848 (GAUZE/BANDAGES/DRESSINGS) ×3 IMPLANT
SUT MNCRL AB 4-0 PS2 18 (SUTURE) ×3 IMPLANT
SUT PROLENE 6 0 BV (SUTURE) ×12 IMPLANT
SUT SILK 2 0 (SUTURE) ×2
SUT SILK 2-0 18XBRD TIE 12 (SUTURE) ×1 IMPLANT
SUT SILK 3 0 (SUTURE) ×2
SUT SILK 3-0 18XBRD TIE 12 (SUTURE) ×1 IMPLANT
SUT SILK 4 0 (SUTURE) ×2
SUT SILK 4-0 18XBRD TIE 12 (SUTURE) ×1 IMPLANT
SUT VIC AB 3-0 SH 27 (SUTURE) ×4
SUT VIC AB 3-0 SH 27X BRD (SUTURE) ×2 IMPLANT
SYR 20CC LL (SYRINGE) ×3 IMPLANT
SYR 3ML LL SCALE MARK (SYRINGE) ×3 IMPLANT
SYR TB 1ML 27GX1/2 LL (SYRINGE) IMPLANT
TOWEL OR 17X26 4PK STRL BLUE (TOWEL DISPOSABLE) IMPLANT

## 2017-08-08 NOTE — Anesthesia Post-op Follow-up Note (Signed)
Anesthesia QCDR form completed.        

## 2017-08-08 NOTE — H&P (Signed)
[facility] History & Physical Update  The patient was interviewed and re-examined.  The patient's previous History and Physical has been reviewed and is unchanged.  There is no change in the plan of care. We plan to proceed with the scheduled procedure.  Leotis Pain, MD  08/08/2017, 2:44 PM

## 2017-08-08 NOTE — Transfer of Care (Signed)
Immediate Anesthesia Transfer of Care Note  Patient: Janet Olson  Procedure(s) Performed: Procedure(s): ARTERIOVENOUS (AV) FISTULA CREATION ( BRACHIOCEPHALIC ) (Left)  Patient Location: PACU  Anesthesia Type:General  Level of Consciousness: sedated  Airway & Oxygen Therapy: Patient Spontanous Breathing and Patient connected to face mask oxygen  Post-op Assessment: Report given to RN and Post -op Vital signs reviewed and stable  Post vital signs: Reviewed and stable  Last Vitals:  Vitals:   08/08/17 1510 08/08/17 1632  BP: 120/83 (!) 112/92  Pulse: 82 94  Resp: 16 (!) 21  Temp:  36.7 C  SpO2: 185% 90%    Complications: No apparent anesthesia complications

## 2017-08-08 NOTE — Anesthesia Procedure Notes (Signed)
Date/Time: 08/08/2017 3:25 PM Performed by: Doreen Salvage Pre-anesthesia Checklist: Patient identified, Emergency Drugs available, Suction available and Patient being monitored Patient Re-evaluated:Patient Re-evaluated prior to induction Oxygen Delivery Method: Simple face mask Induction Type: IV induction Dental Injury: Teeth and Oropharynx as per pre-operative assessment

## 2017-08-08 NOTE — Anesthesia Preprocedure Evaluation (Signed)
Anesthesia Evaluation  Patient identified by MRN, date of birth, ID band Patient awake    Reviewed: Allergy & Precautions, NPO status , Patient's Chart, lab work & pertinent test results  History of Anesthesia Complications Negative for: history of anesthetic complications  Airway Mallampati: II  TM Distance: >3 FB Neck ROM: Full    Dental no notable dental hx.    Pulmonary asthma , neg sleep apnea, COPD,  COPD inhaler, Current Smoker,    breath sounds clear to auscultation- rhonchi (-) wheezing      Cardiovascular Exercise Tolerance: Good (-) hypertension(-) CAD, (-) Past MI and (-) Cardiac Stents  Rhythm:Regular Rate:Normal - Systolic murmurs and - Diastolic murmurs    Neuro/Psych  Headaches, negative psych ROS   GI/Hepatic Neg liver ROS, GERD  ,  Endo/Other  negative endocrine ROSneg diabetes  Renal/GU ESRF and DialysisRenal disease     Musculoskeletal negative musculoskeletal ROS (+)   Abdominal (+) + obese,   Peds  Hematology  (+) anemia ,   Anesthesia Other Findings Past Medical History: 05/17/2017: Acute renal failure (ARF) (HCC) No date: Anemia No date: Asthma No date: Chronic kidney disease No date: COPD (chronic obstructive pulmonary disease) (HCC) No date: Dialysis patient ([place])     Comment:  MON, WED , FRI No date: Dyspnea No date: GERD (gastroesophageal reflux disease) No date: Headache     Comment:  Migraines No date: Ulcer   Reproductive/Obstetrics negative OB ROS                             Anesthesia Physical Anesthesia Plan  ASA: IV  Anesthesia Plan: General   Post-op Pain Management:  Regional for Post-op pain   Induction: Intravenous  PONV Risk Score and Plan: 1 and Propofol infusion  Airway Management Planned: Natural Airway  Additional Equipment:   Intra-op Plan:   Post-operative Plan:   Informed Consent: I have reviewed the patients History  and Physical, chart, labs and discussed the procedure including the risks, benefits and alternatives for the proposed anesthesia with the patient or authorized representative who has indicated his/her understanding and acceptance.   Dental advisory given  Plan Discussed with: CRNA and Anesthesiologist  Anesthesia Plan Comments:         Anesthesia Quick Evaluation

## 2017-08-08 NOTE — Anesthesia Postprocedure Evaluation (Signed)
Anesthesia Post Note  Patient: Janet Olson  Procedure(s) Performed: Procedure(s) (LRB): ARTERIOVENOUS (AV) FISTULA CREATION ( BRACHIOCEPHALIC ) (Left)  Anesthesia Type: General     Last Vitals:  Vitals:   08/08/17 1714 08/08/17 1725  BP: 98/67 120/79  Pulse: 84 82  Resp: 18 16  Temp: 36.6 C (!) 36.3 C  SpO2: 94% 94%    Last Pain:  Vitals:   08/08/17 1725  TempSrc: Temporal  PainSc: 0-No pain                 Angee Gupton S

## 2017-08-08 NOTE — Op Note (Signed)
[facility]   OPERATIVE NOTE   PROCEDURE: Left brachiocephalic arteriovenous fistula placement  PRE-OPERATIVE DIAGNOSIS: 1.  ESRD        POST-OPERATIVE DIAGNOSIS: 1. ESRD       SURGEON: Leotis Pain, MD  ASSISTANT(S): none  ANESTHESIA: general  ESTIMATED BLOOD LOSS: 10 cc  FINDING(S): Adequate cephalic vein for fistula creation  SPECIMEN(S):  none  INDICATIONS:   Janet Olson is a 47 y.o. female who presents with renal failure in need of pemanent dialysis acces.  The patient is scheduled for left arm AVF placement.  The patient is aware the risks include but are not limited to: bleeding, infection, steal syndrome, nerve damage, ischemic monomelic neuropathy, failure to mature, and need for additional procedures.  The patient is aware of the risks of the procedure and elects to proceed forward.  DESCRIPTION: After full informed written consent was obtained from the patient, the patient was brought back to the operating room and placed supine upon the operating table.  Prior to induction, the patient received IV antibiotics.   After obtaining adequate anesthesia, the patient was then prepped and draped in the standard fashion for a left arm access procedure.  I made a curvilinear incision at the level of the antecubital fossa and dissected through the subcutaneous tissue and fascia to gain exposure of the brachial artery.  This was noted to be patent and adequate in size for fistula creation.  This was dissected out proximally and distally and prepared for control with vessel loops .  I then dissected out the cephalic vein.  This was noted to be patent and adequate in size for fistula creation.  I then gave the patient 3000 units of intravenous heparin.  The vein was marked for orientation and the distal segment of the vein was ligated with a  2-0 silk, and the vein was transected.  I then instilled the heparinized saline into the vein and clamped it.  At  this point, I reset my exposure of the brachial artery and pulled up control on the vessel loops.  I made an arteriotomy with a #11 blade, and then I extended the arteriotomy with a Potts scissor.  I injected heparinized saline proximal and distal to this arteriotomy.  The vein was then sewn to the artery in an end-to-side configuration with a running stitch of 6-0 Prolene.  Prior to completing this anastomosis, I allowed the vein and artery to backbleed.  There was no evidence of clot from any vessels.  I completed the anastomosis in the usual fashion and then released all vessel loops and clamps.  There was a palpable  thrill in the venous outflow, and there was a palpable pulse in the artery distal to the anastomosis.  At this point, I irrigated out the surgical wound.  Surgicel was placed. There was no further active bleeding.  The subcutaneous tissue was reapproximated with a running stitch of 3-0 Vicryl.  The skin was then closed with a 4-0 Monocryl suture.  The skin was then cleaned, dried, and reinforced with Dermabond.  The patient tolerated this procedure well and was taken to the recovery room in stable condition  COMPLICATIONS: None  CONDITION: Stable   Leotis Pain    08/08/2017, 4:28 PM  This note was created with Dragon Medical transcription system. Any errors in dictation are purely unintentional.

## 2017-08-08 NOTE — Discharge Instructions (Signed)

## 2017-08-09 ENCOUNTER — Encounter: Payer: Self-pay | Admitting: Vascular Surgery

## 2017-08-09 LAB — TYPE AND SCREEN
ABO/RH(D): O POS
ANTIBODY SCREEN: POSITIVE
DAT, COMPLEMENT: POSITIVE
DAT, IgG: POSITIVE
Unit division: 0
Unit division: 0

## 2017-08-09 LAB — BPAM RBC
BLOOD PRODUCT EXPIRATION DATE: 201808142359
BLOOD PRODUCT EXPIRATION DATE: 201808152359
Unit Type and Rh: 5100
Unit Type and Rh: 5100

## 2017-08-09 NOTE — OR Nursing (Signed)
08/08/17 @ 2020- Patient called to [facility] asking if we could get in contact with Dr. Lucky Cowboy.  Needs pre-authorization on pain med prescription.  Contacted Dr. Lucky Cowboy, advised me his office should have taken care of it when got authorization for surgery.  He will let them know in a.m.  Pharmacy also called, they said they would fax information to Dr. Bunnie Domino office also.  I advised patient to take Motrin or Tylenol as needed &/or to use any narcotic pain medication that may have been left over from any previous visits if needed.  Also apologized to the patient for any inconvenience.  Also told patient that Dr. Lucky Cowboy will notify his office to take care of this in a.m.

## 2017-09-17 ENCOUNTER — Ambulatory Visit (INDEPENDENT_AMBULATORY_CARE_PROVIDER_SITE_OTHER): Payer: Medicaid Other | Admitting: Vascular Surgery

## 2017-09-20 ENCOUNTER — Ambulatory Visit (INDEPENDENT_AMBULATORY_CARE_PROVIDER_SITE_OTHER): Payer: Medicaid Other | Admitting: Vascular Surgery

## 2017-09-23 ENCOUNTER — Ambulatory Visit (INDEPENDENT_AMBULATORY_CARE_PROVIDER_SITE_OTHER): Payer: Medicaid Other | Admitting: Vascular Surgery

## 2017-09-23 ENCOUNTER — Encounter (INDEPENDENT_AMBULATORY_CARE_PROVIDER_SITE_OTHER): Payer: Self-pay | Admitting: Vascular Surgery

## 2017-09-23 VITALS — BP 113/73 | HR 89 | Resp 18 | Ht 66.0 in | Wt 240.0 lb

## 2017-09-23 DIAGNOSIS — N186 End stage renal disease: Secondary | ICD-10-CM

## 2017-09-23 DIAGNOSIS — I129 Hypertensive chronic kidney disease with stage 1 through stage 4 chronic kidney disease, or unspecified chronic kidney disease: Secondary | ICD-10-CM

## 2017-09-23 DIAGNOSIS — Z992 Dependence on renal dialysis: Secondary | ICD-10-CM

## 2017-09-23 DIAGNOSIS — D631 Anemia in chronic kidney disease: Secondary | ICD-10-CM

## 2017-09-23 NOTE — Progress Notes (Signed)
Subjective:    Patient ID: Janet Olson, female    DOB: [birth year], 47 y.o.   MRN: [id number] Chief Complaint  Patient presents with  . Follow-up    6 week [facility] f/u no studies   Patient presents for her first postoperative follow-up. She is s/p a left brachiocephalic arteriovenous fistula placement on 08/08/17. She presents without complaint today. Her postoperative course has been uneventful. She is currently being maintained by a right IJ PermCath without issue. Her incisions are healing well. She denies any fever, nausea or vomiting.   Review of Systems  Constitutional: Negative.   HENT: Negative.   Eyes: Negative.   Respiratory: Negative.   Cardiovascular: Negative.   Gastrointestinal: Negative.   Endocrine: Negative.   Genitourinary:       ESRD  Musculoskeletal: Negative.   Skin: Negative.   Allergic/Immunologic: Negative.   Neurological: Negative.   Hematological: Negative.   Psychiatric/Behavioral: Negative.       Objective:   Physical Exam  Constitutional: She is oriented to person, place, and time. She appears well-developed and well-nourished. No distress.  HENT:  Head: Normocephalic and atraumatic.  Eyes: Pupils are equal, round, and reactive to light. Conjunctivae are normal.  Neck: Normal range of motion.  Cardiovascular: Normal rate, regular rhythm, normal heart sounds and intact distal pulses.   Pulses:      Radial pulses are 2+ on the right side, and 2+ on the left side.  Left upper extremity: Dialysis access with good thrill and bruit.  Pulmonary/Chest: Effort normal.  Musculoskeletal: Normal range of motion. She exhibits no edema.  Neurological: She is alert and oriented to person, place, and time.  Skin: Skin is warm and dry. She is not diaphoretic.     Incision healing well.  Psychiatric: She has a normal mood and affect. Her behavior is normal. Judgment and thought content normal.  Vitals reviewed.   BP 113/73 (BP Location: Right Arm)    Pulse 89   Resp 18   Ht 5\' 6"  (1.676 m)   Wt 240 lb (108.9 kg)   BMI 38.74 kg/m   Past Medical History:  Diagnosis Date  . Acute renal failure (ARF) ([place]) 05/17/2017  . Anemia   . Asthma   . Chronic kidney disease   . COPD (chronic obstructive pulmonary disease) ([place])   . Dialysis patient ([place])    MON, WED , FRI  . Dyspnea   . GERD (gastroesophageal reflux disease)   . Headache    Migraines  . Ulcer     Social History   Social History  . Marital status: Single    Spouse name: N/A  . Number of children: N/A  . Years of education: N/A   Occupational History  . Not on file.   Social History Main Topics  . Smoking status: Current Every Day Smoker    Packs/day: 0.50    Years: 30.00    Types: Cigarettes  . Smokeless tobacco: Never Used  . Alcohol use No  . Drug use: No  . Sexual activity: Not on file   Other Topics Concern  . Not on file   Social History Narrative  . No narrative on file    Past Surgical History:  Procedure Laterality Date  . AV FISTULA PLACEMENT Left 08/08/2017   Procedure: ARTERIOVENOUS (AV) FISTULA CREATION ( BRACHIOCEPHALIC );  Surgeon: Algernon Huxley, MD;  Location: [facility] ORS;  Service: Vascular;  Laterality: Left;  . CESAREAN SECTION     x  4 ([place], 1997, 2000 )  . DIALYSIS/PERMA CATHETER INSERTION N/A 07/16/2017   Procedure: Dialysis/Perma Catheter Insertion;  Surgeon: Katha Cabal, MD;  Location: [facility];  Service: Cardiovascular;  Laterality: N/A;  . excision of lymph nodes Bilateral 2014   bilateral under arms.    Family History  Problem Relation Age of Onset  . Diabetes Mother     No Known Allergies     Assessment & Plan:  Patient presents for her first postoperative follow-up. She is s/p a left brachiocephalic arteriovenous fistula placement on 08/08/17. She presents without complaint today. Her postoperative course has been uneventful. She is currently being maintained by a right IJ PermCath without  issue. Her incisions are healing well. She denies any fever, nausea or vomiting.  1. ESRD on dialysis s/p ([place]) left brachiocephalic arteriovenous fistula placement on 08/08/17 Postoperative course has been uneventful Patient is doing well postoperatively No issues with current right IJ PermCath. OK to start dialyzing through fistula today Patient follow-up in 6 months for surveillance HDA  2. Anemia in chronic kidney disease, on chronic dialysis (HCC) - Stable Asymptomatic at this time Followed by nephrologist  3. Benign hypertensive renal disease - Stable Encouraged good control as its slows the progression of atherosclerotic disease  Current Outpatient Prescriptions on File Prior to Visit  Medication Sig Dispense Refill  . albuterol (PROVENTIL HFA;VENTOLIN HFA) 108 (90 Base) MCG/ACT inhaler Inhale 2 puffs into the lungs every 6 (six) hours as needed for wheezing or shortness of breath. 18 g 3  . albuterol (PROVENTIL) (2.5 MG/3ML) 0.083% nebulizer solution Take 3 mLs (2.5 mg total) by nebulization 3 (three) times daily. 75 mL 3  . amLODipine (NORVASC) 10 MG tablet Take 1 tablet (10 mg total) by mouth daily. 30 tablet 3  . fluticasone (FLONASE) 50 MCG/ACT nasal spray Place 1 spray into both nostrils daily. 16 g 0  . HYDROcodone-acetaminophen (NORCO) 5-325 MG tablet Take 1 tablet by mouth every 6 (six) hours as needed for moderate pain. 30 tablet 0  . metoprolol tartrate (LOPRESSOR) 25 MG tablet Take 1 tablet (25 mg total) by mouth 2 (two) times daily. 60 tablet 3  . sucralfate (CARAFATE) 1 g tablet Take 1 tablet (1 g total) by mouth 4 (four) times daily. 120 tablet 1  . tiotropium (SPIRIVA) 18 MCG inhalation capsule Place 1 capsule (18 mcg total) into inhaler and inhale 2 (two) times daily. 30 capsule 3  . ferrous sulfate (EQL SLOW RELEASE IRON) 160 (50 Fe) MG TBCR SR tablet Take 1 tablet (160 mg total) by mouth 2 (two) times daily. (Patient not taking: Reported on 09/23/2017) 60 tablet 3    . furosemide (LASIX) 40 MG tablet Take 1 tablet (40 mg total) by mouth 2 (two) times daily. 60 tablet 3   No current facility-administered medications on file prior to visit.     There are no Patient Instructions on file for this visit. No Follow-up on file.   Jilliann Subramanian A Yardley Beltran, PA-C

## 2017-10-07 ENCOUNTER — Other Ambulatory Visit: Payer: Self-pay | Admitting: Family Medicine

## 2017-10-07 DIAGNOSIS — Z1239 Encounter for other screening for malignant neoplasm of breast: Secondary | ICD-10-CM

## 2017-10-24 ENCOUNTER — Encounter (INDEPENDENT_AMBULATORY_CARE_PROVIDER_SITE_OTHER): Payer: Self-pay

## 2017-10-25 ENCOUNTER — Other Ambulatory Visit (INDEPENDENT_AMBULATORY_CARE_PROVIDER_SITE_OTHER): Payer: Self-pay | Admitting: Vascular Surgery

## 2017-10-29 ENCOUNTER — Ambulatory Visit
Admission: RE | Admit: 2017-10-29 | Discharge: 2017-10-29 | Disposition: A | Discharge status: Home / Self Care | Payer: Medicaid Other | Source: Ambulatory Visit | Attending: Vascular Surgery | Admitting: Vascular Surgery

## 2017-10-29 ENCOUNTER — Encounter
Admission: RE | Disposition: A | Discharge status: Home / Self Care | Payer: Self-pay | Source: Ambulatory Visit | Attending: Vascular Surgery

## 2017-10-29 DIAGNOSIS — Z833 Family history of diabetes mellitus: Secondary | ICD-10-CM | POA: Insufficient documentation

## 2017-10-29 DIAGNOSIS — G43909 Migraine, unspecified, not intractable, without status migrainosus: Secondary | ICD-10-CM | POA: Diagnosis not present

## 2017-10-29 DIAGNOSIS — I2510 Atherosclerotic heart disease of native coronary artery without angina pectoris: Secondary | ICD-10-CM | POA: Insufficient documentation

## 2017-10-29 DIAGNOSIS — F17210 Nicotine dependence, cigarettes, uncomplicated: Secondary | ICD-10-CM | POA: Diagnosis not present

## 2017-10-29 DIAGNOSIS — Z452 Encounter for adjustment and management of vascular access device: Secondary | ICD-10-CM | POA: Insufficient documentation

## 2017-10-29 DIAGNOSIS — Z992 Dependence on renal dialysis: Secondary | ICD-10-CM | POA: Diagnosis not present

## 2017-10-29 DIAGNOSIS — N186 End stage renal disease: Secondary | ICD-10-CM | POA: Diagnosis not present

## 2017-10-29 DIAGNOSIS — I120 Hypertensive chronic kidney disease with stage 5 chronic kidney disease or end stage renal disease: Secondary | ICD-10-CM | POA: Insufficient documentation

## 2017-10-29 DIAGNOSIS — J449 Chronic obstructive pulmonary disease, unspecified: Secondary | ICD-10-CM | POA: Diagnosis not present

## 2017-10-29 DIAGNOSIS — E1122 Type 2 diabetes mellitus with diabetic chronic kidney disease: Secondary | ICD-10-CM | POA: Diagnosis not present

## 2017-10-29 DIAGNOSIS — Z9889 Other specified postprocedural states: Secondary | ICD-10-CM | POA: Diagnosis not present

## 2017-10-29 HISTORY — PX: DIALYSIS/PERMA CATHETER REMOVAL: CATH118289

## 2017-10-29 SURGERY — DIALYSIS/PERMA CATHETER REMOVAL
Anesthesia: LOCAL

## 2017-10-29 MED ORDER — LIDOCAINE-EPINEPHRINE (PF) 1 %-1:200000 IJ SOLN
INTRAMUSCULAR | Status: DC | PRN
  Administered 2017-10-29: 20 mL via INTRADERMAL

## 2017-10-29 SURGICAL SUPPLY — 5 items
DERMABOND ADVANCED (GAUZE/BANDAGES/DRESSINGS) ×1
DERMABOND ADVANCED .7 DNX12 (GAUZE/BANDAGES/DRESSINGS) ×1 IMPLANT
FORCEPS HALSTEAD CVD 5IN STRL (INSTRUMENTS) ×2 IMPLANT
SUT MNCRL AB 4-0 PS2 18 (SUTURE) ×2 IMPLANT
TRAY LACERAT/PLASTIC (MISCELLANEOUS) ×2 IMPLANT

## 2017-10-29 NOTE — Discharge Instructions (Signed)
Tunneled Catheter Removal, Care After °Refer to this sheet in the next few weeks. These instructions provide you with information about caring for yourself after your procedure. Your health care provider may also give you more specific instructions. Your treatment has been planned according to current medical practices, but problems sometimes occur. Call your health care provider if you have any problems or questions after your procedure. °What can I expect after the procedure? °After the procedure, it is common to have: °· Some mild redness, swelling, and pain around your catheter site. ° ° °Follow these instructions at home: °Incision care  °· Check your removal site  every day for signs of infection. Check for: °¨ More redness, swelling, or pain. °¨ More fluid or blood. °¨ Warmth. °¨ Pus or a bad smell. °· Follow instructions from your health care provider about how to take care of your removal site. Make sure you: °¨ Wash your hands with soap and water before you change your bandages (dressings). If soap and water are not available, use hand sanitizer. °Activity  °· Return to your normal activities as told by your health care provider. Ask your health care provider what activities are safe for you. °· Do not lift anything that is heavier than 10 lb (4.5 kg) for 3 weeks or as long as told by your health care provider. ° °Contact a health care provider if: °· You have more fluid or blood coming from your removal site °· You have more redness, swelling, or pain at your incisions or around the area where your catheter was removed °· Your removal site feel warm to the touch. °· You feel unusually weak. °· You feel nauseous.. °· Get help right away if °· You have swelling in your arm, shoulder, neck, or face. °· You develop chest pain. °· You have difficulty breathing. °· You feel dizzy or light-headed. °· You have pus or a bad smell coming from your removal site °· You have a fever. °· You develop bleeding from your  removal site, and your bleeding does not stop. °This information is not intended to replace advice given to you by your health care provider. Make sure you discuss any questions you have with your health care provider. °Document Released: 12/03/2012 Document Revised: 08/19/2016 Document Reviewed: 09/12/2015 °Elsevier Interactive Patient Education © 2017 Elsevier Inc. ° °

## 2017-10-29 NOTE — H&P (Signed)
[facility] Admission History & Physical  MRN : [id number]  Janet Olson is a 47 y.o. (January 12, 1970) female who presents with chief complaint of No chief complaint on file. Marland Kitchen  History of Present Illness: I am asked to evaluate the patient by the dialysis center. The patient was sent here because they have a nonfunctioning tunneled catheter and a functioning left brachiocephalic fistula.  The patient reports they're not been any problems with any of their dialysis runs. They are reporting good flows with good parameters at dialysis.  Patient denies pain or tenderness overlying the access.  There is no pain with dialysis.  The patient denies hand pain or finger pain consistent with steal syndrome.  No fevers or chills while on dialysis.   No current facility-administered medications for this encounter.     Past Medical History:  Diagnosis Date  . Acute renal failure (ARF) ([place]) 05/17/2017  . Anemia   . Asthma   . Chronic kidney disease   . COPD (chronic obstructive pulmonary disease) ([place])   . Dialysis patient ([place])    MON, WED , FRI  . Dyspnea   . GERD (gastroesophageal reflux disease)   . Headache    Migraines  . Ulcer     Past Surgical History:  Procedure Laterality Date  . AV FISTULA PLACEMENT Left 08/08/2017   Procedure: ARTERIOVENOUS (AV) FISTULA CREATION ( BRACHIOCEPHALIC );  Surgeon: Algernon Huxley, MD;  Location: [facility];  Service: Vascular;  Laterality: Left;  . CESAREAN SECTION     x 4 (1988, 1991, 1997, 2000 )  . DIALYSIS/PERMA CATHETER INSERTION N/A 07/16/2017   Procedure: Dialysis/Perma Catheter Insertion;  Surgeon: Katha Cabal, MD;  Location: [facility];  Service: Cardiovascular;  Laterality: N/A;  . excision of lymph nodes Bilateral 2014   bilateral under arms.    Social History Social History  Substance Use Topics  . Smoking status: Current Every Day Smoker    Packs/day: 0.50    Years: 30.00    Types:  Cigarettes  . Smokeless tobacco: Never Used  . Alcohol use No    Family History Family History  Problem Relation Age of Onset  . Diabetes Mother     No family history of bleeding or clotting disorders, autoimmune disease or porphyria  No Known Allergies   REVIEW OF SYSTEMS (Negative unless checked)  Constitutional: [] Weight loss  [] Fever  [] Chills Cardiac: [] Chest pain   [] Chest pressure   [] Palpitations   [] Shortness of breath when laying flat   [] Shortness of breath at rest   [x] Shortness of breath with exertion. Vascular:  [] Pain in legs with walking   [] Pain in legs at rest   [] Pain in legs when laying flat   [] Claudication   [] Pain in feet when walking  [] Pain in feet at rest  [] Pain in feet when laying flat   [] History of DVT   [] Phlebitis   [] Swelling in legs   [] Varicose veins   [] Non-healing ulcers Pulmonary:   [] Uses home oxygen   [] Productive cough   [] Hemoptysis   [] Wheeze  [] COPD   [] Asthma Neurologic:  [] Dizziness  [] Blackouts   [] Seizures   [] History of stroke   [] History of TIA  [] Aphasia   [] Temporary blindness   [] Dysphagia   [] Weakness or numbness in arms   [] Weakness or numbness in legs Musculoskeletal:  [] Arthritis   [] Joint swelling   [] Joint pain   [] Low back pain Hematologic:  [] Easy bruising  [] Easy bleeding   []   Hypercoagulable state   [] Anemic  [] Hepatitis Gastrointestinal:  [] Blood in stool   [] Vomiting blood  [] Gastroesophageal reflux/heartburn   [] Difficulty swallowing. Genitourinary:  [x] Chronic kidney disease   [] Difficult urination  [] Frequent urination  [] Burning with urination   [] Blood in urine Skin:  [] Rashes   [] Ulcers   [] Wounds Psychological:  [] History of anxiety   []  History of major depression.  Physical Examination  Vitals:   10/29/17 1125  BP: (!) 137/91  Pulse: 90  Resp: 14  Temp: 98.7 F (37.1 C)  TempSrc: Oral  SpO2: 97%   There is no height or weight on file to calculate BMI. Gen: WD/WN, NAD Head: [place]/AT, No temporalis wasting.  Prominent temp pulse not noted. Ear/Nose/Throat: Hearing grossly intact, nares w/o erythema or drainage, oropharynx w/o Erythema/Exudate,  Eyes: Conjunctiva clear, sclera non-icteric Neck: Trachea midline.  No JVD.  Pulmonary:  Good air movement, respirations not labored, no use of accessory muscles.  Cardiac: RRR, normal S1, S2. Vascular: Left arm fistula good thrill good bruit Vessel Right Left  Radial Palpable Palpable  Ulnar Not Palpable Not Palpable  Brachial Palpable Palpable  Carotid Palpable, without bruit Palpable, without bruit  Gastrointestinal: soft, non-tender/non-distended. No guarding/reflex.  Musculoskeletal: M/S 5/5 throughout.  Extremities without ischemic changes.  No deformity or atrophy.  Neurologic: Sensation grossly intact in extremities.  Symmetrical.  Speech is fluent. Motor exam as listed above. Psychiatric: Judgment intact, Mood & affect appropriate for pt's clinical situation. Dermatologic: No rashes or ulcers noted.  No cellulitis or open wounds. Lymph : No Cervical, Axillary, or Inguinal lymphadenopathy.   CBC Lab Results  Component Value Date   WBC 7.1 08/06/2017   HGB 8.5 (L) 08/08/2017   HCT 25.0 (L) 08/08/2017   MCV 87.4 08/06/2017   PLT 319 08/06/2017    BMET    Component Value Date/Time   NA 140 08/08/2017 1434   NA 139 09/23/2014 1741   K 3.8 08/08/2017 1434   K 4.2 09/23/2014 1741   CL 104 08/06/2017 1231   CL 112 (H) 09/23/2014 1741   CO2 27 08/06/2017 1231   CO2 21 09/23/2014 1741   GLUCOSE 90 08/08/2017 1434   GLUCOSE 82 09/23/2014 1741   BUN 35 (H) 08/06/2017 1231   BUN 18 09/23/2014 1741   CREATININE 3.55 (H) 08/06/2017 1231   CREATININE 1.41 (H) 09/23/2014 1741   CALCIUM 8.5 (L) 08/06/2017 1231   CALCIUM 8.9 09/23/2014 1741   GFRNONAA 14 (L) 08/06/2017 1231   GFRNONAA 43 (L) 09/23/2014 1741   GFRNONAA 43 (L) 11/20/2012 1129   GFRAA 16 (L) 08/06/2017 1231   GFRAA 52 (L) 09/23/2014 1741   GFRAA 49 (L) 11/20/2012 1129    CrCl cannot be calculated (Patient's most recent lab result is older than the maximum 21 days allowed.).  COAG Lab Results  Component Value Date   INR 0.93 08/06/2017    Radiology No results found.  Assessment/Plan 1.  Complication dialysis device with thrombosis AV access:  Patient's right IJ tunneled catheter is malfunctioning. The patient has an extremity access that is functioning well. Therefore, the patient will undergo removal of the tunneled catheter under local anesthesia.  The risks and benefits were described to the patient.  All questions were answered.  The patient agrees to proceed with angiography and intervention. Potassium will be drawn to ensure that it is an appropriate level prior to performing intervention. 2.  End-stage renal disease requiring hemodialysis:  Patient will continue dialysis therapy without further interruption if a  successful intervention is not achieved then a tunneled catheter will be placed. Dialysis has already been arranged. 3.  Hypertension:  Patient will continue medical management; nephrology is following no changes in oral medications. 4. Diabetes mellitus:  Glucose will be monitored and oral medications been held this morning once the patient has undergone the patient's procedure po intake will be reinitiated and again Accu-Cheks will be used to assess the blood glucose level and treat as needed. The patient will be restarted on the patient's usual hypoglycemic regime 5.  Coronary artery disease:  EKG will be monitored. Nitrates will be used if needed. The patient's oral cardiac medications will be continued.    Hortencia Pilar, MD  10/29/2017 1:58 PM

## 2017-11-01 ENCOUNTER — Encounter: Payer: Self-pay | Admitting: Vascular Surgery

## 2017-11-01 NOTE — Op Note (Signed)
  OPERATIVE NOTE   PROCEDURE: 1. Removal of a right IJ tunneled dialysis catheter  PRE-OPERATIVE DIAGNOSIS: Complication of dialysis catheter  POST-OPERATIVE DIAGNOSIS: Same  SURGEON: Hortencia Pilar  ANESTHESIA: Local anesthetic with 1% lidocaine with epinephrine   ESTIMATED BLOOD LOSS: Minimal   FINDING(S): 1. Catheter intact   SPECIMEN(S):  Catheter  INDICATIONS:   Janet Olson is a 47 y.o. female who presents with functioning of extremity access.  She is therefore undergoing removal of her tunneled dialysis catheter.  DESCRIPTION: After obtaining full informed written consent, the patient was positioned supine. The right IJ tunneled catheter and surrounding area is prepped and draped in a sterile fashion. The cuff was localized by palpation and noted to be greater than 3 cm from the exit site. After appropriate timeout is called, 1% lidocaine with epinephrine is infiltrated into the surrounding tissues around the cuff. Small transverse incision is created with an 11 blade scalpel and the dissection was carried down to expose the cuff of the tunneled catheter.  The catheter is then freed from the surrounding attachments and adhesions. Once the catheter has been freed circumferentially it is transected just distal to the cuff and subsequently removed in 2 pieces. Light pressure was held at the base of the neck. A 4-0 Monocryl was used close the tunnel in the subcutaneous space. The 4-0 Monocryl Monocryl was then used to close the skin in a subcuticular stitch. Dermabond is applied.  Antibiotic ointment and a sterile dressing is applied to the exit site. Patient tolerated procedure well and there were no complications.  COMPLICATIONS: None  CONDITION: Unchanged  Hortencia Pilar. [facility] Office: [phone]  11/01/2017,1:42 PM

## 2017-11-08 ENCOUNTER — Other Ambulatory Visit: Payer: Self-pay | Admitting: Family Medicine

## 2018-01-02 ENCOUNTER — Encounter: Payer: Self-pay | Admitting: *Deleted

## 2018-01-07 NOTE — Progress Notes (Signed)
Cardiology Office Note  Date:  01/08/2018   ID:  Janet, Olson Sep 23, 1970, MRN [id number]  PCP:  Remi Haggard, FNP   Chief Complaint  Patient presents with  . OTHER    Abn ECHO no complaints today. Meds reviewed verbally with pt.    HPI:  Ms. Simmering is a 48 year old woman with past medical history of ESRD On HD COPD HTN/renal dz Anemia of chronic dz Asthma Smoker diabetes Who presents by referral from [facility] for abnormal echocardiogram  Previous echocardiogram 2015 showing mild to moderate LVH, normal ejection fraction mild pulmonary regurg and mitral regurg   Repeat echocardiogram may 2018 with mild LVH, normal ejection fraction, diastolic dysfunction  Patient reports having more recent echocardiogram, results not available We did call our office of [facility] and they were out to lunch  Review of notes indicates hx of nonfunctioning tunneled catheter and a functioning left brachiocephalic fistula.   Evaluated by Dr. Delana Meyer  General she reports she is doing well, She still makes urine, goes to dialysis Monday Wednesday Friday for a "cleaning" But felt better when she had a port, has more fatigue using AV fistula  EKG personally reviewed by myself on todays visit Shows normal sinus rhythm rate 87 bpm no significant ST or T wave changes  Lab work reviewed showing total cholesterol 156, LDL 56, hemoglobin 7.9, vitamin D 7.8   PMH:   has a past medical history of Acute renal failure (ARF) ([place]) (05/17/2017), Anemia, Asthma, Cardiomegaly, Chronic kidney disease, COPD (chronic obstructive pulmonary disease) ([place]), Dialysis patient ([place]), Dyspnea, Enlarged heart, GERD (gastroesophageal reflux disease), Headache, Hypertension, Nonrheumatic tricuspid (valve) insufficiency, Ulcer, and Vitamin D deficiency.  PSH:    Past Surgical History:  Procedure Laterality Date  . AV FISTULA PLACEMENT Left 08/08/2017   Procedure: ARTERIOVENOUS (AV) FISTULA  CREATION ( BRACHIOCEPHALIC );  Surgeon: Algernon Huxley, MD;  Location: [facility];  Service: Vascular;  Laterality: Left;  . CESAREAN SECTION     x 4 (1988, 1991, 1997, 2000 )  . DIALYSIS/PERMA CATHETER INSERTION N/A 07/16/2017   Procedure: Dialysis/Perma Catheter Insertion;  Surgeon: Katha Cabal, MD;  Location: [facility];  Service: Cardiovascular;  Laterality: N/A;  . DIALYSIS/PERMA CATHETER REMOVAL N/A 10/29/2017   Procedure: DIALYSIS/PERMA CATHETER REMOVAL;  Surgeon: Katha Cabal, MD;  Location: [facility];  Service: Cardiovascular;  Laterality: N/A;  . excision of lymph nodes Bilateral 2014   bilateral under arms.    Current Outpatient Medications  Medication Sig Dispense Refill  . albuterol (PROVENTIL HFA;VENTOLIN HFA) 108 (90 Base) MCG/ACT inhaler Inhale 2 puffs into the lungs every 6 (six) hours as needed for wheezing or shortness of breath. 18 g 3  . albuterol (PROVENTIL) (2.5 MG/3ML) 0.083% nebulizer solution Take 3 mLs (2.5 mg total) by nebulization 3 (three) times daily. 75 mL 3  . amLODipine (NORVASC) 10 MG tablet Take 1 tablet (10 mg total) by mouth daily. 30 tablet 3  . benzonatate (TESSALON) 100 MG capsule Take 100 mg by mouth 3 (three) times daily as needed for cough.    . DULoxetine (CYMBALTA) 30 MG capsule Take 30 mg by mouth daily.  0  . furosemide (LASIX) 40 MG tablet take 1 tablet by mouth twice a day 60 tablet 2  . lidocaine-prilocaine (EMLA) cream Apply 1 application topically daily as needed [facility] access for dialysis).    . metoprolol tartrate (LOPRESSOR) 25 MG tablet Take 1 tablet (25 mg total) by mouth  2 (two) times daily. 60 tablet 3  . multivitamin (RENA-VIT) TABS tablet Take 1 tablet by mouth daily.  1  . RA COL-RITE 100 MG capsule Take 100 mg by mouth daily.   1  . ROBAFEN AC 100-10 MG/5ML syrup Take 5 mLs by mouth 3 (three) times daily as needed for cough.  0  . tiotropium (SPIRIVA) 18 MCG inhalation capsule Place 1 capsule (18 mcg  total) into inhaler and inhale 2 (two) times daily. (Patient taking differently: Place 18 mcg into inhaler and inhale daily as needed. ) 30 capsule 3  . Vitamin D, Ergocalciferol, (DRISDOL) 50000 units CAPS capsule Take 50,000 Units by mouth once a week. Wednesday  0   No current facility-administered medications for this visit.      Allergies:   Patient has no known allergies.   Social History:  The patient  reports that she has been smoking cigarettes.  She has a 15.00 pack-year smoking history. she has never used smokeless tobacco. She reports that she does not drink alcohol or use drugs.   Family History:   family history includes Diabetes in her mother.    Review of Systems: Review of Systems  Constitutional: Positive for malaise/fatigue.  Respiratory: Negative.   Cardiovascular: Negative.   Gastrointestinal: Negative.   Musculoskeletal: Negative.   Neurological: Negative.   Psychiatric/Behavioral: Negative.   All other systems reviewed and are negative.    PHYSICAL EXAM: VS:  BP (!) 154/87 (BP Location: Right Arm, Patient Position: Sitting, Cuff Size: Large)   Pulse 87   Ht 5\' 6"  (1.676 m)   Wt 236 lb 4 oz (107.2 kg)   BMI 38.13 kg/m  , BMI Body mass index is 38.13 kg/m. GEN: Well nourished, well developed, in no acute distress  HEENT: normal  Neck: no JVD, carotid bruits, or masses Cardiac: RRR; no murmurs, rubs, or gallops,no edema  Respiratory:  clear to auscultation bilaterally, normal work of breathing GI: soft, nontender, nondistended, + BS MS: no deformity or atrophy  Skin: warm and dry, no rash Neuro:  Strength and sensation are intact Psych: euthymic mood, full affect    Recent Labs: 05/17/2017: B Natriuretic Peptide 75.0 08/06/2017: ALT 21; BUN 35; Creatinine, Ser 3.55; Platelets 319 08/08/2017: Hemoglobin 8.5; Potassium 3.8; Sodium 140    Lipid Panel Lab Results  Component Value Date   CHOL 198 05/17/2017   HDL 63 05/17/2017   LDLCALC 96  05/17/2017   TRIG 197 (H) 05/17/2017      Wt Readings from Last 3 Encounters:  01/08/18 236 lb 4 oz (107.2 kg)  09/23/17 240 lb (108.9 kg)  08/08/17 235 lb (106.6 kg)       ASSESSMENT AND PLAN:  ESRD on dialysis [facility]) - Plan: EKG 12-Lead Therapy on Monday Wednesday Friday  Chronic obstructive pulmonary disease, unspecified COPD type ([place]) - Plan: EKG 12-Lead Long history of smoking who continues to smoke We have encouraged her to continue to work on weaning her cigarettes and smoking cessation. She will continue to work on this and does not want any assistance with chantix.   Anemia in chronic kidney disease, on chronic dialysis ([place]) - Plan: EKG 12-Lead Hemoglobin around 10-11  Managed by nephrology, may need Procrit if numbers continue to trend lower Also may benefit from iron studies, infusion of flow  benign hypertensive renal disease - Plan: EKG 12-Lead LVH noted on echocardiogram, no change from [id number] Diastolic dysfunction also noted, grade 1 We have requested most recent echocardiogram from primary  care Notes indicating she has tricuspid valve regurg.  TR will be a dynamic finding depending on her fluid status with dialysis, and underlying anemia.  Likely of little clinical concern at this time.  For worsening shortness of breath would recommend lower dry weight  Cardiac hypertrophy Recommend aggressive blood pressure control No medication changes made at this time  Low vitamin D Will defer to primary care, likely needs supplement  We will call her with the results of the most recent echocardiogram Disposition:   F/U as needed   Total encounter time more than 60 minutes  Greater than 50% was spent in counseling and coordination of care with the patient    Orders Placed This Encounter  Procedures  . EKG 12-Lead     Signed, Esmond Plants, M.D., Ph.D. 01/08/2018  [place], [place]

## 2018-01-08 ENCOUNTER — Ambulatory Visit (INDEPENDENT_AMBULATORY_CARE_PROVIDER_SITE_OTHER): Payer: Medicaid Other | Admitting: Cardiovascular Disease

## 2018-01-08 ENCOUNTER — Ambulatory Visit: Payer: Medicaid Other | Admitting: Cardiovascular Disease

## 2018-01-08 ENCOUNTER — Encounter: Payer: Self-pay | Admitting: Cardiovascular Disease

## 2018-01-08 VITALS — BP 154/87 | HR 87 | Ht 66.0 in | Wt 236.2 lb

## 2018-01-08 DIAGNOSIS — D631 Anemia in chronic kidney disease: Secondary | ICD-10-CM

## 2018-01-08 DIAGNOSIS — J449 Chronic obstructive pulmonary disease, unspecified: Secondary | ICD-10-CM

## 2018-01-08 DIAGNOSIS — N186 End stage renal disease: Secondary | ICD-10-CM | POA: Diagnosis not present

## 2018-01-08 DIAGNOSIS — Z992 Dependence on renal dialysis: Secondary | ICD-10-CM | POA: Diagnosis not present

## 2018-01-08 DIAGNOSIS — I129 Hypertensive chronic kidney disease with stage 1 through stage 4 chronic kidney disease, or unspecified chronic kidney disease: Secondary | ICD-10-CM | POA: Diagnosis not present

## 2018-01-08 NOTE — Patient Instructions (Signed)
Medication Instructions:   No medication changes made  Labwork:  No new labs needed  Testing/Procedures:  No further testing at this time   Follow-Up: It was a pleasure seeing you in the office today. Please call us if you have new issues that need to be addressed before your next appt.  [phone]  Your physician wants you to follow-up in:  As needed  If you need a refill on your cardiac medications before your next appointment, please call your pharmacy.     

## 2018-01-15 ENCOUNTER — Telehealth: Payer: Self-pay | Admitting: Cardiovascular Disease

## 2018-01-15 NOTE — Telephone Encounter (Signed)
erroro

## 2018-01-29 ENCOUNTER — Ambulatory Visit: Payer: Self-pay | Admitting: Internal Medicine

## 2018-02-13 ENCOUNTER — Ambulatory Visit: Payer: Medicaid Other | Admitting: Cardiovascular Disease

## 2018-02-21 ENCOUNTER — Other Ambulatory Visit: Payer: Self-pay

## 2018-02-21 ENCOUNTER — Encounter: Payer: Self-pay | Admitting: Emergency Medicine

## 2018-02-21 DIAGNOSIS — J45909 Unspecified asthma, uncomplicated: Secondary | ICD-10-CM | POA: Diagnosis not present

## 2018-02-21 DIAGNOSIS — R1013 Epigastric pain: Secondary | ICD-10-CM | POA: Insufficient documentation

## 2018-02-21 DIAGNOSIS — I120 Hypertensive chronic kidney disease with stage 5 chronic kidney disease or end stage renal disease: Secondary | ICD-10-CM | POA: Diagnosis not present

## 2018-02-21 DIAGNOSIS — K640 First degree hemorrhoids: Secondary | ICD-10-CM | POA: Insufficient documentation

## 2018-02-21 DIAGNOSIS — N185 Chronic kidney disease, stage 5: Secondary | ICD-10-CM | POA: Diagnosis not present

## 2018-02-21 DIAGNOSIS — J449 Chronic obstructive pulmonary disease, unspecified: Secondary | ICD-10-CM | POA: Insufficient documentation

## 2018-02-21 DIAGNOSIS — F17210 Nicotine dependence, cigarettes, uncomplicated: Secondary | ICD-10-CM | POA: Insufficient documentation

## 2018-02-21 DIAGNOSIS — Z79899 Other long term (current) drug therapy: Secondary | ICD-10-CM | POA: Diagnosis not present

## 2018-02-21 LAB — URINALYSIS, COMPLETE (UACMP) WITH MICROSCOPIC
Bilirubin Urine: NEGATIVE
Glucose, UA: 150 mg/dL — AB
Hgb urine dipstick: NEGATIVE
Ketones, ur: NEGATIVE mg/dL
Leukocytes, UA: NEGATIVE
Nitrite: NEGATIVE
Protein, ur: 100 mg/dL — AB
Specific Gravity, Urine: 1.029 (ref 1.005–1.030)
pH: 6 (ref 5.0–8.0)

## 2018-02-21 LAB — COMPREHENSIVE METABOLIC PANEL
ALT: 23 U/L (ref 14–54)
AST: 25 U/L (ref 15–41)
Albumin: 3 g/dL — ABNORMAL LOW (ref 3.5–5.0)
Alkaline Phosphatase: 90 U/L (ref 38–126)
Anion gap: 13 (ref 5–15)
BUN: 19 mg/dL (ref 6–20)
CO2: 25 mmol/L (ref 22–32)
Calcium: 8.1 mg/dL — ABNORMAL LOW (ref 8.9–10.3)
Chloride: 101 mmol/L (ref 101–111)
Creatinine, Ser: 3.78 mg/dL — ABNORMAL HIGH (ref 0.44–1.00)
GFR calc Af Amer: 15 mL/min — ABNORMAL LOW (ref >60)
GFR calc non Af Amer: 13 mL/min — ABNORMAL LOW (ref >60)
Glucose, Bld: 134 mg/dL — ABNORMAL HIGH (ref 65–99)
Potassium: 3 mmol/L — ABNORMAL LOW (ref 3.5–5.1)
Sodium: 139 mmol/L (ref 135–145)
Total Bilirubin: 0.4 mg/dL (ref 0.3–1.2)
Total Protein: 6.8 g/dL (ref 6.5–8.1)

## 2018-02-21 LAB — CBC
HCT: 36.4 % (ref 35.0–47.0)
Hemoglobin: 11.6 g/dL — ABNORMAL LOW (ref 12.0–16.0)
MCH: 27.6 pg (ref 26.0–34.0)
MCHC: 31.8 g/dL — ABNORMAL LOW (ref 32.0–36.0)
MCV: 86.9 fL (ref 80.0–100.0)
Platelets: 309 10*3/uL (ref 150–440)
RBC: 4.19 MIL/uL (ref 3.80–5.20)
RDW: 19.8 % — ABNORMAL HIGH (ref 11.5–14.5)
WBC: 5.2 10*3/uL (ref 3.6–11.0)

## 2018-02-21 LAB — LIPASE, BLOOD: Lipase: 43 U/L (ref 11–51)

## 2018-02-21 NOTE — ED Triage Notes (Signed)
Pt arrives POV and ambulatory to triage with c/o of possible hemorrhoids "off and on" for over two months and abdominal pain. Pt is in NAD.

## 2018-02-22 ENCOUNTER — Emergency Department: Payer: Medicaid Other

## 2018-02-22 ENCOUNTER — Emergency Department
Admission: EM | Admit: 2018-02-22 | Discharge: 2018-02-22 | Disposition: A | Discharge status: Home / Self Care | Payer: Medicaid Other | Attending: Emergency Medicine | Admitting: Emergency Medicine

## 2018-02-22 DIAGNOSIS — R1013 Epigastric pain: Secondary | ICD-10-CM

## 2018-02-22 DIAGNOSIS — K640 First degree hemorrhoids: Secondary | ICD-10-CM

## 2018-02-22 LAB — ETHANOL

## 2018-02-22 LAB — POCT PREGNANCY, URINE: PREG TEST UR: NEGATIVE

## 2018-02-22 MED ORDER — GI COCKTAIL ~~LOC~~
30.0000 mL | Freq: Once | ORAL | Status: AC
  Administered 2018-02-22: 30 mL via ORAL
  Filled 2018-02-22: qty 30

## 2018-02-22 MED ORDER — SUCRALFATE 1 GM/10ML PO SUSP: 1.0000 g | Freq: Three times a day (TID) | ORAL | 0 refills | Status: DC

## 2018-02-22 MED ORDER — PANTOPRAZOLE SODIUM 40 MG PO TBEC: 40.0000 mg | DELAYED_RELEASE_TABLET | Freq: Every day | ORAL | 0 refills | Status: DC

## 2018-02-22 NOTE — ED Provider Notes (Signed)
[facility] Department Provider Note   ____________________________________________   First MD Initiated Contact with Patient 02/22/18 [phone]     (approximate)  I have reviewed the triage vital signs and the nursing notes.   HISTORY  Chief Complaint Abdominal Pain and Hemorrhoids    HPI Janet Olson is a 48 y.o. female who presents to the ED from home with a chief complaint of epigastric abdominal pain and hemorrhoids.  Patient symptoms have been waxing/waning for the past 2 months.  States she has a history of ulcers but since she stopped drinking heavily 2 months ago she has gone off her medicines.  States she now drinks "a wine cooler now and again".  Describes epigastric abdominal discomfort not associated with eating.  States she has not had much appetite.  Occasionally sees bright red blood per rectum but states she also has hemorrhoids.  Denies recent fever, chills, chest pain, shortness of breath, nausea, vomiting.  States she has chronic diarrhea since she has been on dialysis.  Had full dialysis Friday.  Still makes urine.  Denies recent travel or trauma.  Denies anticoagulant use.   Past Medical History:  Diagnosis Date  . Acute renal failure (ARF) ([place]) 05/17/2017  . Anemia   . Asthma   . Cardiomegaly   . Chronic kidney disease   . COPD (chronic obstructive pulmonary disease) ([place])   . Dialysis patient ([place])    MON, WED , FRI  . Dyspnea   . Enlarged heart   . GERD (gastroesophageal reflux disease)   . Headache    Migraines  . Hypertension   . Nonrheumatic tricuspid (valve) insufficiency   . Ulcer   . Vitamin D deficiency     Patient Active Problem List   Diagnosis Date Noted  . ESRD on dialysis ([place]) 08/02/2017  . COPD (chronic obstructive pulmonary disease) ([place]) 06/20/2017  . Anemia in chronic kidney disease 06/07/2017  . CKD (chronic kidney disease) stage 5, GFR less than 15 ml/min (HCC) 06/07/2017  . Benign  hypertensive renal disease 05/23/2017  . Mass of left axilla 12/07/2013    Past Surgical History:  Procedure Laterality Date  . AV FISTULA PLACEMENT Left 08/08/2017   Procedure: ARTERIOVENOUS (AV) FISTULA CREATION ( BRACHIOCEPHALIC );  Surgeon: Algernon Huxley, MD;  Location: [facility] ORS;  Service: Vascular;  Laterality: Left;  . CESAREAN SECTION     x 4 (1988, 1991, 1997, 2000 )  . DIALYSIS/PERMA CATHETER INSERTION N/A 07/16/2017   Procedure: Dialysis/Perma Catheter Insertion;  Surgeon: Katha Cabal, MD;  Location: [place] [facility];  Service: Cardiovascular;  Laterality: N/A;  . DIALYSIS/PERMA CATHETER REMOVAL N/A 10/29/2017   Procedure: DIALYSIS/PERMA CATHETER REMOVAL;  Surgeon: Katha Cabal, MD;  Location: [place] [facility];  Service: Cardiovascular;  Laterality: N/A;  . excision of lymph nodes Bilateral 2014   bilateral under arms.    Prior to Admission medications   Medication Sig Start Date End Date Taking? Authorizing Provider  albuterol (PROVENTIL HFA;VENTOLIN HFA) 108 (90 Base) MCG/ACT inhaler Inhale 2 puffs into the lungs every 6 (six) hours as needed for wheezing or shortness of breath. 06/20/17   Johnson, Megan P, DO  albuterol (PROVENTIL) (2.5 MG/3ML) 0.083% nebulizer solution Take 3 mLs (2.5 mg total) by nebulization 3 (three) times daily. 06/20/17   Johnson, Megan P, DO  amLODipine (NORVASC) 10 MG tablet Take 1 tablet (10 mg total) by mouth daily. 06/12/17   Doles-Johnson, Teah, NP  benzonatate (TESSALON) 100  MG capsule Take 100 mg by mouth 3 (three) times daily as needed for cough.    [provider]  DULoxetine (CYMBALTA) 30 MG capsule Take 30 mg by mouth daily. 12/05/17   [provider]  furosemide (LASIX) 40 MG tablet take 1 tablet by mouth twice a day 11/08/17   Park Liter P, DO  lidocaine-prilocaine (EMLA) cream Apply 1 application topically daily as needed [facility] access for dialysis).    [provider]  metoprolol tartrate  (LOPRESSOR) 25 MG tablet Take 1 tablet (25 mg total) by mouth 2 (two) times daily. 06/12/17   Doles-Johnson, Teah, NP  multivitamin (RENA-VIT) TABS tablet Take 1 tablet by mouth daily. 08/23/17   [provider]  pantoprazole (PROTONIX) 40 MG tablet Take 1 tablet (40 mg total) by mouth daily. 02/22/18   Paulette Blanch, MD  RA COL-RITE 100 MG capsule Take 100 mg by mouth daily.  08/28/17   [provider]  ROBAFEN AC 100-10 MG/5ML syrup Take 5 mLs by mouth 3 (three) times daily as needed for cough. 10/14/17   [provider]  sucralfate (CARAFATE) 1 GM/10ML suspension Take 10 mLs (1 g total) by mouth 4 (four) times daily -  with meals and at bedtime. 02/22/18   Paulette Blanch, MD  tiotropium (SPIRIVA) 18 MCG inhalation capsule Place 1 capsule (18 mcg total) into inhaler and inhale 2 (two) times daily. Patient taking differently: Place 18 mcg into inhaler and inhale daily as needed.  06/12/17   Doles-Johnson, Teah, NP  Vitamin D, Ergocalciferol, (DRISDOL) 50000 units CAPS capsule Take 50,000 Units by mouth once a week. Wednesday 10/22/17   [provider]    Allergies Patient has no known allergies.  Family History  Problem Relation Age of Onset  . Diabetes Mother     Social History Social History   Tobacco Use  . Smoking status: Current Every Day Smoker    Packs/day: 0.50    Years: 30.00    Pack years: 15.00    Types: Cigarettes  . Smokeless tobacco: Never Used  Substance Use Topics  . Alcohol use: No  . Drug use: No    Review of Systems  Constitutional: No fever/chills. Eyes: No visual changes. ENT: No sore throat. Cardiovascular: Denies chest pain. Respiratory: Denies shortness of breath. Gastrointestinal: Positive for abdominal pain.  No nausea, no vomiting.  Positive for diarrhea.  No constipation.  Positive for bloody stools. Genitourinary: Negative for dysuria. Musculoskeletal: Negative for back pain. Skin: Negative for rash. Neurological:  Negative for headaches, focal weakness or numbness.   ____________________________________________   PHYSICAL EXAM:  VITAL SIGNS: ED Triage Vitals  Enc Vitals Group     BP 02/21/18 2213 (!) 154/85     Pulse Rate 02/21/18 2213 85     Resp 02/21/18 2213 18     Temp 02/21/18 2213 97.6 F (36.4 C)     Temp Source 02/21/18 2213 Oral     SpO2 02/21/18 2213 98 %     Weight 02/21/18 2213 210 lb (95.3 kg)     Height 02/21/18 2213 5\' 6"  (1.676 m)     Head Circumference --      Peak Flow --      Pain Score 02/21/18 2231 5     Pain Loc --      Pain Edu? --      Excl. in [place]? --     Constitutional: Alert and oriented. Well appearing and in no acute distress. Eyes:  Conjunctivae are normal. PERRL. EOMI. Head: Atraumatic. Nose: No congestion/rhinnorhea. Mouth/Throat: Mucous membranes are moist.  Oropharynx non-erythematous. Neck: No stridor.   Cardiovascular: Normal rate, regular rhythm. Grossly normal heart sounds.  Good peripheral circulation. Respiratory: Normal respiratory effort.  No retractions. Lungs CTAB. Gastrointestinal: Soft and mildly tender to palpation epigastrium without rebound or guarding. No distention. No abdominal bruits. No CVA tenderness. Musculoskeletal: No lower extremity tenderness nor edema.  No joint effusions. Neurologic:  Normal speech and language. No gross focal neurologic deficits are appreciated. No gait instability. Skin:  Skin is warm, dry and intact. No rash noted. Psychiatric: Mood and affect are normal. Speech and behavior are normal.  ____________________________________________   LABS (all labs ordered are listed, but only abnormal results are displayed)  Labs Reviewed  COMPREHENSIVE METABOLIC PANEL - Abnormal; Notable for the following components:      Result Value   Potassium 3.0 (*)    Glucose, Bld 134 (*)    Creatinine, Ser 3.78 (*)    Calcium 8.1 (*)    Albumin 3.0 (*)    GFR calc non Af Amer 13 (*)    GFR calc Af Amer 15 (*)    All  other components within normal limits  CBC - Abnormal; Notable for the following components:   Hemoglobin 11.6 (*)    MCHC 31.8 (*)    RDW 19.8 (*)    All other components within normal limits  URINALYSIS, COMPLETE (UACMP) WITH MICROSCOPIC - Abnormal; Notable for the following components:   Color, Urine AMBER (*)    APPearance CLOUDY (*)    Glucose, UA 150 (*)    Protein, ur 100 (*)    Bacteria, UA RARE (*)    Squamous Epithelial / LPF TOO NUMEROUS TO COUNT (*)    All other components within normal limits  URINE CULTURE  LIPASE, BLOOD  ETHANOL  POC URINE PREG, ED  POCT PREGNANCY, URINE   ____________________________________________  EKG  ED ECG REPORT I, Furkan Keenum J, the attending physician, personally viewed and interpreted this ECG.   Date: 02/22/2018  EKG Time: 2230  Rate: 82  Rhythm: normal EKG, normal sinus rhythm  Axis: Normal  Intervals:none  ST&T Change: Nonspecific  ____________________________________________  RADIOLOGY  ED MD interpretation: No gallstones  Official radiology report(s): US Abdomen Limited Ruq  Result Date: 02/22/2018 CLINICAL DATA:  Abdominal pain for 2-3 weeks EXAM: ULTRASOUND ABDOMEN LIMITED RIGHT UPPER QUADRANT COMPARISON:  CT 03/21/2017, renal ultrasound 05/17/2017 FINDINGS: Gallbladder: No gallstones or wall thickening visualized. No sonographic Murphy sign noted by sonographer. Common bile duct: Diameter: 3.7 mm Liver: No focal lesion identified. Within normal limits in parenchymal echogenicity. Portal vein is patent on color Doppler imaging with normal direction of blood flow towards the liver. Incidental note made of echogenic right kidney IMPRESSION: 1. Negative for gallstones or biliary dilatation 2. Incidental note made of echogenic right kidney consistent with medical renal disease. Electronically Signed   By: Donavan Foil M.D.   On: 02/22/2018 01:30    ____________________________________________   PROCEDURES  Procedure(s)  performed:   Rectal exam: External exam with reducible nonthrombosed, nonbleeding hemorrhoid. Tan stool on gloved finger which is heme negative.  Procedures  Critical Care performed: No  ____________________________________________   INITIAL IMPRESSION / ASSESSMENT AND PLAN / ED COURSE  As part of my medical decision making, I reviewed the following data within the [facility] notes reviewed and incorporated, Labs reviewed, EKG interpreted, Old chart reviewed, Radiograph reviewed and Notes from  prior ED visits.   48 year old female with PUD, ESRD on HD, heavy drinker who presents with epigastric discomfort and bloody stools waxing/waning over the past 2 months. Differential diagnosis includes, but is not limited to, biliary disease (biliary colic, acute cholecystitis, cholangitis, choledocholithiasis, etc), intrathoracic causes for epigastric abdominal pain including ACS, gastritis, duodenitis, pancreatitis, small bowel or large bowel obstruction, abdominal aortic aneurysm, hernia, and gastritis.  Hemoglobin and hematocrit are stable.  Creatinine is baseline.  Will administer GI cocktail and obtain right upper quadrant abdominal ultrasound to evaluate for cholecystitis.  Clinical Course as of Feb 22 145  Sat Feb 22, 2018  0143 Updated patient and family members of laboratory and imaging results.  Since patient is a dialysis patient, I have added a urine culture.  Will start patient on Pepcid twice daily and refer her to GI for outpatient follow-up.  Strict return precautions given.  Patient verbalizes understanding and agrees with plan of care.  [JS]    Clinical Course User Index [JS] Paulette Blanch, MD     ____________________________________________   FINAL CLINICAL IMPRESSION(S) / ED DIAGNOSES  Final diagnoses:  Epigastric pain  Grade I hemorrhoids     ED Discharge Orders        Ordered    sucralfate (CARAFATE) 1 GM/10ML suspension  3 times daily  with meals & bedtime     02/22/18 0145    pantoprazole (PROTONIX) 40 MG tablet  Daily     02/22/18 0145       Note:  This document was prepared using Dragon voice recognition software and may include unintentional dictation errors.    Paulette Blanch, MD 02/22/18 [phone]

## 2018-02-22 NOTE — ED Notes (Signed)
Patient transported to Ultrasound 

## 2018-02-22 NOTE — Discharge Instructions (Addendum)
1.  Start the following medicines to help with your abdominal discomfort: Protonix 40 mg daily Carafate 4 times daily 2.  Avoid greasy, fatty, spicy foods and alcohol. 3.  Return to the ER for worsening symptoms, persistent vomiting, difficulty breathing or other concerns.

## 2018-02-23 LAB — URINE CULTURE

## 2018-03-25 ENCOUNTER — Encounter (INDEPENDENT_AMBULATORY_CARE_PROVIDER_SITE_OTHER): Payer: Self-pay

## 2018-03-25 ENCOUNTER — Encounter (INDEPENDENT_AMBULATORY_CARE_PROVIDER_SITE_OTHER): Payer: Medicaid Other

## 2018-03-25 ENCOUNTER — Ambulatory Visit (INDEPENDENT_AMBULATORY_CARE_PROVIDER_SITE_OTHER): Payer: Medicaid Other | Admitting: Vascular Surgery

## 2018-04-10 ENCOUNTER — Ambulatory Visit (INDEPENDENT_AMBULATORY_CARE_PROVIDER_SITE_OTHER): Payer: Medicaid Other

## 2018-04-10 ENCOUNTER — Ambulatory Visit (INDEPENDENT_AMBULATORY_CARE_PROVIDER_SITE_OTHER): Payer: Medicaid Other | Admitting: Vascular Surgery

## 2018-04-10 ENCOUNTER — Encounter (INDEPENDENT_AMBULATORY_CARE_PROVIDER_SITE_OTHER): Payer: Self-pay | Admitting: Vascular Surgery

## 2018-04-10 ENCOUNTER — Encounter (INDEPENDENT_AMBULATORY_CARE_PROVIDER_SITE_OTHER): Payer: Self-pay

## 2018-04-10 ENCOUNTER — Encounter (INDEPENDENT_AMBULATORY_CARE_PROVIDER_SITE_OTHER): Payer: Self-pay | Admitting: *Deleted

## 2018-04-10 VITALS — BP 138/86 | HR 87 | Resp 17 | Ht 65.0 in | Wt 224.0 lb

## 2018-04-10 DIAGNOSIS — D631 Anemia in chronic kidney disease: Secondary | ICD-10-CM | POA: Diagnosis not present

## 2018-04-10 DIAGNOSIS — N186 End stage renal disease: Secondary | ICD-10-CM

## 2018-04-10 DIAGNOSIS — Z992 Dependence on renal dialysis: Secondary | ICD-10-CM | POA: Diagnosis not present

## 2018-04-10 NOTE — Progress Notes (Signed)
Subjective:    Patient ID: Nelly Rout, female    DOB: [birth year], 48 y.o.   MRN: [id number] Chief Complaint  Patient presents with  . Follow-up    43mo HDA   Patient presents for a six-month HDA follow-up.  The patient presents today with out complaint.  The patient underwent a left upper extremity HDA which was notable for a total flow volume of 1992.  There is an area of significant narrowing at the proximal UA (852) -this is notable for vessel diameter narrowing.  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  There is a competing branch at the distal UA.  The patient denies any left upper extremity arm pain / ulceration.  The patient's dialysis access is functioning relatively well.  The patient denies any uremic symptoms.  The patient denies any fever, nausea or vomiting.  Review of Systems  Constitutional: Negative.   HENT: Negative.   Eyes: Negative.   Respiratory: Negative.   Gastrointestinal: Negative.   Endocrine: Negative.   Genitourinary:       ESRD  Musculoskeletal: Negative.   Skin: Negative.   Allergic/Immunologic: Negative.   Neurological: Negative.   Hematological: Negative.   Psychiatric/Behavioral: Negative.       Objective:   Physical Exam  Constitutional: She is oriented to person, place, and time. She appears well-developed and well-nourished. No distress.  HENT:  Head: Normocephalic and atraumatic.  Eyes: Pupils are equal, round, and reactive to light. Conjunctivae are normal.  Neck: Normal range of motion.  Cardiovascular: Normal rate, regular rhythm, normal heart sounds and intact distal pulses.  Pulses:      Radial pulses are 2+ on the right side, and 2+ on the left side.  Right upper extremity dialysis access: Good bruit and thrill  Pulmonary/Chest: Effort normal and breath sounds normal.  Musculoskeletal: Normal range of motion. She exhibits no edema.  Neurological: She is alert and oriented to person, place, and time.  Skin: Skin is warm  and dry. She is not diaphoretic.  Psychiatric: She has a normal mood and affect. Her behavior is normal. Judgment and thought content normal.  Vitals reviewed.  BP 138/86 (BP Location: Right Arm)   Pulse 87   Resp 17   Ht 5\' 5"  (1.651 m)   Wt 224 lb (101.6 kg)   BMI 37.28 kg/m   Past Medical History:  Diagnosis Date  . Acute renal failure (ARF) ([place]) 05/17/2017  . Anemia   . Asthma   . Cardiomegaly   . Chronic kidney disease   . COPD (chronic obstructive pulmonary disease) ([place])   . Dialysis patient ([place])    MON, WED , FRI  . Dyspnea   . Enlarged heart   . GERD (gastroesophageal reflux disease)   . Headache    Migraines  . Hypertension   . Nonrheumatic tricuspid (valve) insufficiency   . Ulcer   . Vitamin D deficiency    Social History   Socioeconomic History  . Marital status: Single    Spouse name: Not on file  . Number of children: Not on file  . Years of education: Not on file  . Highest education level: Not on file  Occupational History  . Not on file  Social Needs  . Financial resource strain: Not on file  . Food insecurity:    Worry: Not on file    Inability: Not on file  . Transportation needs:    Medical: Not on file    Non-medical:  Not on file  Tobacco Use  . Smoking status: Current Every Day Smoker    Packs/day: 0.50    Years: 30.00    Pack years: 15.00    Types: Cigarettes  . Smokeless tobacco: Never Used  Substance and Sexual Activity  . Alcohol use: No  . Drug use: No  . Sexual activity: Not on file  Lifestyle  . Physical activity:    Days per week: Not on file    Minutes per session: Not on file  . Stress: Not on file  Relationships  . Social connections:    Talks on phone: Not on file    Gets together: Not on file    Attends religious service: Not on file    Active member of club or organization: Not on file    Attends meetings of clubs or organizations: Not on file    Relationship status: Not on file  . Intimate partner  violence:    Fear of current or ex partner: Not on file    Emotionally abused: Not on file    Physically abused: Not on file    Forced sexual activity: Not on file  Other Topics Concern  . Not on file  Social History Narrative  . Not on file   Past Surgical History:  Procedure Laterality Date  . AV FISTULA PLACEMENT Left 08/08/2017   Procedure: ARTERIOVENOUS (AV) FISTULA CREATION ( BRACHIOCEPHALIC );  Surgeon: Algernon Huxley, MD;  Location: [facility] ORS;  Service: Vascular;  Laterality: Left;  . CESAREAN SECTION     x 4 (1988, 1991, 1997, 2000 )  . DIALYSIS/PERMA CATHETER INSERTION N/A 07/16/2017   Procedure: Dialysis/Perma Catheter Insertion;  Surgeon: Katha Cabal, MD;  Location: [place] [facility];  Service: Cardiovascular;  Laterality: N/A;  . DIALYSIS/PERMA CATHETER REMOVAL N/A 10/29/2017   Procedure: DIALYSIS/PERMA CATHETER REMOVAL;  Surgeon: Katha Cabal, MD;  Location: [place] [facility];  Service: Cardiovascular;  Laterality: N/A;  . excision of lymph nodes Bilateral 2014   bilateral under arms.   Family History  Problem Relation Age of Onset  . Diabetes Mother    No Known Allergies     Assessment & Plan:  Patient presents for a six-month HDA follow-up.  The patient presents today with out complaint.  The patient underwent a left upper extremity HDA which was notable for a total flow volume of 1992.  There is an area of significant narrowing at the proximal UA (852) -this is notable for vessel diameter narrowing.  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  There is a competing branch at the distal UA.  The patient denies any left upper extremity arm pain / ulceration.  The patient's dialysis access is functioning relatively well.  The patient denies any uremic symptoms.  The patient denies any fever, nausea or vomiting.  1. ESRD on dialysis ([place]) - Stable Patient presents for a 75-month HDA follow-up There is an area of significant narrowing to the proximal dialysis  access. Recommend a right upper extremity fistulogram with possible intervention to assess this area and if appropriate corrected to restore adequate function to the patient's dialysis access. If this area is not corrected it has the potential to continue to narrow and possibly thrombosed her access. Procedure, risks and benefits explained to the patient All questions answered The patient wishes to proceed  2. Anemia in chronic kidney disease, on chronic dialysis ([place]) - Stable Patient presents asymptomatically today Is followed by her primary  care physician and nephrologist.  Current Outpatient Medications on File Prior to Visit  Medication Sig Dispense Refill  . albuterol (PROVENTIL HFA;VENTOLIN HFA) 108 (90 Base) MCG/ACT inhaler Inhale 2 puffs into the lungs every 6 (six) hours as needed for wheezing or shortness of breath. 18 g 3  . albuterol (PROVENTIL) (2.5 MG/3ML) 0.083% nebulizer solution Take 3 mLs (2.5 mg total) by nebulization 3 (three) times daily. 75 mL 3  . amLODipine (NORVASC) 10 MG tablet Take 1 tablet (10 mg total) by mouth daily. 30 tablet 3  . benzonatate (TESSALON) 100 MG capsule Take 100 mg by mouth 3 (three) times daily as needed for cough.    . DULoxetine (CYMBALTA) 30 MG capsule Take 30 mg by mouth daily.  0  . furosemide (LASIX) 40 MG tablet take 1 tablet by mouth twice a day 60 tablet 2  . lidocaine-prilocaine (EMLA) cream Apply 1 application topically daily as needed [facility] access for dialysis).    . metoprolol tartrate (LOPRESSOR) 25 MG tablet Take 1 tablet (25 mg total) by mouth 2 (two) times daily. 60 tablet 3  . multivitamin (RENA-VIT) TABS tablet Take 1 tablet by mouth daily.  1  . pantoprazole (PROTONIX) 40 MG tablet Take 1 tablet (40 mg total) by mouth daily. 30 tablet 0  . RA COL-RITE 100 MG capsule Take 100 mg by mouth daily.   1  . ROBAFEN AC 100-10 MG/5ML syrup Take 5 mLs by mouth 3 (three) times daily as needed for cough.  0  . SENSIPAR 30 MG tablet   1    . sucralfate (CARAFATE) 1 GM/10ML suspension Take 10 mLs (1 g total) by mouth 4 (four) times daily -  with meals and at bedtime. 420 mL 0  . tiotropium (SPIRIVA) 18 MCG inhalation capsule Place 1 capsule (18 mcg total) into inhaler and inhale 2 (two) times daily. (Patient taking differently: Place 18 mcg into inhaler and inhale daily as needed. ) 30 capsule 3  . Vitamin D, Ergocalciferol, (DRISDOL) 50000 units CAPS capsule Take 50,000 Units by mouth once a week. Wednesday  0   No current facility-administered medications on file prior to visit.    There are no Patient Instructions on file for this visit. No follow-ups on file.  KIMBERLY A STEGMAYER, PA-C

## 2018-04-14 ENCOUNTER — Other Ambulatory Visit (INDEPENDENT_AMBULATORY_CARE_PROVIDER_SITE_OTHER): Payer: Self-pay | Admitting: Vascular Surgery

## 2018-04-15 ENCOUNTER — Ambulatory Visit: Payer: Medicaid Other | Admitting: Gastroenterology

## 2018-04-15 ENCOUNTER — Encounter: Payer: Self-pay | Admitting: Gastroenterology

## 2018-04-15 ENCOUNTER — Other Ambulatory Visit: Payer: Self-pay

## 2018-04-15 ENCOUNTER — Encounter (INDEPENDENT_AMBULATORY_CARE_PROVIDER_SITE_OTHER): Payer: Self-pay

## 2018-04-15 VITALS — BP 143/88 | HR 86 | Temp 98.1°F | Ht 64.0 in | Wt 226.8 lb

## 2018-04-15 DIAGNOSIS — K625 Hemorrhage of anus and rectum: Secondary | ICD-10-CM

## 2018-04-15 NOTE — Progress Notes (Signed)
Cephas Darby, MD [address]  [place]  [place], [place] [address]  Main: [phone]  Fax: [phone]    Gastroenterology Consultation  Referring Provider:     [facility] [place] [facility] Primary Care Physician:  Remi Haggard, FNP Primary Gastroenterologist:  Dr. Cephas Darby Reason for Consultation:     Painless rectal bleeding        HPI:   Janet Olson is a 48 y.o. female referred by Dr. Lavena Bullion, Jordan Likes, FNP  for consultation & management of rectal bleeding. Patient has been experiencing painless rectal bleeding since January 2019. She reports that she has been on dialysis for approximately one year secondary to hypertension. She reports having regular bowel movements, denies constipation or diarrhea. She denies any other hemorrhoid symptoms. Bleeding per rectum episodes are intermittent and self-limiting. She has mild normocytic anemia. She does report intermittent abdominal bloating  NSAIDs: none  Antiplts/Anticoagulants/Anti thrombotics: none  GI Procedures: none She denies family history of GI malignancy in first-degree or second-degree relatives She did not have any GI surgeries  Past Medical History:  Diagnosis Date  . Acute renal failure (ARF) ([place]) 05/17/2017  . Anemia   . Asthma   . Cardiomegaly   . Chronic kidney disease   . COPD (chronic obstructive pulmonary disease) ([place])   . Dialysis patient ([place])    MON, WED , FRI  . Dyspnea   . Enlarged heart   . GERD (gastroesophageal reflux disease)   . Headache    Migraines  . Hypertension   . Nonrheumatic tricuspid (valve) insufficiency   . Ulcer   . Vitamin D deficiency     Past Surgical History:  Procedure Laterality Date  . AV FISTULA PLACEMENT Left 08/08/2017   Procedure: ARTERIOVENOUS (AV) FISTULA CREATION ( BRACHIOCEPHALIC );  Surgeon: Algernon Huxley, MD;  Location: [facility] ORS;  Service: Vascular;  Laterality: Left;  . CESAREAN SECTION     x 4 (1988, 1991, 1997, 2000 )  .  DIALYSIS/PERMA CATHETER INSERTION N/A 07/16/2017   Procedure: Dialysis/Perma Catheter Insertion;  Surgeon: Katha Cabal, MD;  Location: [facility];  Service: Cardiovascular;  Laterality: N/A;  . DIALYSIS/PERMA CATHETER REMOVAL N/A 10/29/2017   Procedure: DIALYSIS/PERMA CATHETER REMOVAL;  Surgeon: Katha Cabal, MD;  Location: [facility];  Service: Cardiovascular;  Laterality: N/A;  . excision of lymph nodes Bilateral 2014   bilateral under arms.     Current Outpatient Medications:  .  albuterol (PROVENTIL HFA;VENTOLIN HFA) 108 (90 Base) MCG/ACT inhaler, Inhale 2 puffs into the lungs every 6 (six) hours as needed for wheezing or shortness of breath., Disp: 18 g, Rfl: 3 .  albuterol (PROVENTIL) (2.5 MG/3ML) 0.083% nebulizer solution, Take 3 mLs (2.5 mg total) by nebulization 3 (three) times daily. (Patient taking differently: Take 2.5 mg by nebulization every 4 (four) hours as needed for shortness of breath. ), Disp: 75 mL, Rfl: 3 .  amLODipine (NORVASC) 10 MG tablet, Take 1 tablet (10 mg total) by mouth daily., Disp: 30 tablet, Rfl: 3 .  DULERA 200-5 MCG/ACT AERO, Inhale 2 puffs into the lungs 2 (two) times daily., Disp: , Rfl: 0 .  DULoxetine (CYMBALTA) 60 MG capsule, Take 60 mg by mouth 2 (two) times daily. , Disp: , Rfl: 0 .  furosemide (LASIX) 40 MG tablet, take 1 tablet by mouth twice a day, Disp: 60 tablet, Rfl: 2 .  lidocaine-prilocaine (EMLA) cream, Apply 1 application topically daily as needed [facility] access  for dialysis)., Disp: , Rfl:  .  metoprolol tartrate (LOPRESSOR) 25 MG tablet, Take 1 tablet (25 mg total) by mouth 2 (two) times daily., Disp: 60 tablet, Rfl: 3 .  multivitamin (RENA-VIT) TABS tablet, Take 1 tablet by mouth daily., Disp: , Rfl: 1 .  pantoprazole (PROTONIX) 40 MG tablet, Take 1 tablet (40 mg total) by mouth daily., Disp: 30 tablet, Rfl: 0 .  SENSIPAR 30 MG tablet, Take 30 mg by mouth See admin instructions. Mon Wed Fri daily, Disp: , Rfl:  1 .  tiotropium (SPIRIVA) 18 MCG inhalation capsule, Place 1 capsule (18 mcg total) into inhaler and inhale 2 (two) times daily. (Patient taking differently: Place 18 mcg into inhaler and inhale daily as needed. ), Disp: 30 capsule, Rfl: 3 .  Vitamin D, Ergocalciferol, (DRISDOL) 50000 units CAPS capsule, Take 50,000 Units by mouth once a week. SUNDAYS, Disp: , Rfl: 0   Family History  Problem Relation Age of Onset  . Diabetes Mother      Social History   Tobacco Use  . Smoking status: Current Every Day Smoker    Packs/day: 0.50    Years: 30.00    Pack years: 15.00    Types: Cigarettes  . Smokeless tobacco: Never Used  Substance Use Topics  . Alcohol use: No  . Drug use: No    Allergies as of 04/15/2018  . (No Known Allergies)    Review of Systems:    All systems reviewed and negative except where noted in HPI.   Physical Exam:  BP (!) 143/88   Pulse 86   Temp 98.1 F (36.7 C) (Oral)   Ht 5\' 4"  (1.626 m)   Wt 226 lb 12.8 oz (102.9 kg)   BMI 38.93 kg/m  No LMP recorded.  General:   Alert,  Well-developed, well-nourished, pleasant and cooperative in NAD Head:  Normocephalic and atraumatic. Eyes:  Sclera clear, no icterus.   Conjunctiva pink. Ears:  Normal auditory acuity. Nose:  No deformity, discharge, or lesions. Mouth:  No deformity or lesions,oropharynx pink & moist. Neck:  Supple; no masses or thyromegaly. Lungs:  Respirations even and unlabored.  Clear throughout to auscultation.   No wheezes, crackles, or rhonchi. No acute distress. Heart:  Regular rate and rhythm; no murmurs, clicks, rubs, or gallops. Abdomen:  Normal bowel sounds. Soft, obese, non-tender without masses, hepatosplenomegaly or hernias noted.  No guarding or rebound tenderness.   Rectal: Not performed Msk:  Symmetrical without gross deformities. Good, equal movement & strength bilaterally. Pulses:  Normal pulses noted. Extremities:  No clubbing or edema.  No cyanosis, left upper arm AV  fistula. Neurologic:  Alert and oriented x3;  grossly normal neurologically. Skin:  Intact without significant lesions or rashes. No jaundice. Psych:  Alert and cooperative. Normal mood and affect.  Imaging Studies: reviewed  Assessment and Plan:   Aerie Donica is a 48 y.o. African-American female with end stage renal disease secondary to hypertension on hemodialysis with approximately 4-6 month history of painless rectal bleeding, intermittent. Her history is highly suggestive of bleeding from internal hemorrhoids. However, she is overdue for colon cancer screening given her ethnicity  Recommend colonoscopy Discussed with her about outpatient hemorrhoid ligation after the colonoscopy, information provided  Follow up after the colonoscopy   Cephas Darby, MD

## 2018-04-16 ENCOUNTER — Other Ambulatory Visit: Payer: Self-pay

## 2018-04-16 DIAGNOSIS — K625 Hemorrhage of anus and rectum: Secondary | ICD-10-CM

## 2018-04-16 MED ORDER — CEFAZOLIN SODIUM-DEXTROSE 1-4 GM/50ML-% IV SOLN
1.0000 g | Freq: Once | INTRAVENOUS | Status: AC
  Administered 2018-04-17: 1 g via INTRAVENOUS

## 2018-04-17 ENCOUNTER — Ambulatory Visit
Admission: RE | Admit: 2018-04-17 | Discharge: 2018-04-17 | Disposition: A | Discharge status: Home / Self Care | Payer: Medicaid Other | Source: Ambulatory Visit | Attending: Vascular Surgery | Admitting: Vascular Surgery

## 2018-04-17 ENCOUNTER — Encounter
Admission: RE | Disposition: A | Discharge status: Home / Self Care | Payer: Self-pay | Source: Ambulatory Visit | Attending: Vascular Surgery

## 2018-04-17 DIAGNOSIS — Z9889 Other specified postprocedural states: Secondary | ICD-10-CM | POA: Insufficient documentation

## 2018-04-17 DIAGNOSIS — Z992 Dependence on renal dialysis: Secondary | ICD-10-CM | POA: Insufficient documentation

## 2018-04-17 DIAGNOSIS — T82868A Thrombosis of vascular prosthetic devices, implants and grafts, initial encounter: Secondary | ICD-10-CM | POA: Diagnosis not present

## 2018-04-17 DIAGNOSIS — Z833 Family history of diabetes mellitus: Secondary | ICD-10-CM | POA: Insufficient documentation

## 2018-04-17 DIAGNOSIS — T82858A Stenosis of vascular prosthetic devices, implants and grafts, initial encounter: Secondary | ICD-10-CM | POA: Insufficient documentation

## 2018-04-17 DIAGNOSIS — K219 Gastro-esophageal reflux disease without esophagitis: Secondary | ICD-10-CM | POA: Insufficient documentation

## 2018-04-17 DIAGNOSIS — J449 Chronic obstructive pulmonary disease, unspecified: Secondary | ICD-10-CM | POA: Diagnosis not present

## 2018-04-17 DIAGNOSIS — D631 Anemia in chronic kidney disease: Secondary | ICD-10-CM | POA: Insufficient documentation

## 2018-04-17 DIAGNOSIS — Y832 Surgical operation with anastomosis, bypass or graft as the cause of abnormal reaction of the patient, or of later complication, without mention of misadventure at the time of the procedure: Secondary | ICD-10-CM | POA: Insufficient documentation

## 2018-04-17 DIAGNOSIS — N186 End stage renal disease: Secondary | ICD-10-CM | POA: Diagnosis not present

## 2018-04-17 DIAGNOSIS — I120 Hypertensive chronic kidney disease with stage 5 chronic kidney disease or end stage renal disease: Secondary | ICD-10-CM | POA: Insufficient documentation

## 2018-04-17 DIAGNOSIS — F17210 Nicotine dependence, cigarettes, uncomplicated: Secondary | ICD-10-CM | POA: Diagnosis not present

## 2018-04-17 DIAGNOSIS — N179 Acute kidney failure, unspecified: Secondary | ICD-10-CM | POA: Insufficient documentation

## 2018-04-17 DIAGNOSIS — Z79899 Other long term (current) drug therapy: Secondary | ICD-10-CM | POA: Insufficient documentation

## 2018-04-17 HISTORY — PX: A/V FISTULAGRAM: CATH118298

## 2018-04-17 LAB — POTASSIUM (ARMC VASCULAR LAB ONLY): POTASSIUM (ARMC VASCULAR LAB): 4.2 (ref 3.5–5.1)

## 2018-04-17 SURGERY — A/V FISTULAGRAM
Anesthesia: Moderate Sedation | Laterality: Left

## 2018-04-17 MED ORDER — HEPARIN SODIUM (PORCINE) 1000 UNIT/ML IJ SOLN
INTRAMUSCULAR | Status: DC | PRN
  Administered 2018-04-17: 3000 [IU] via INTRAVENOUS

## 2018-04-17 MED ORDER — HEPARIN SODIUM (PORCINE) 1000 UNIT/ML IJ SOLN
INTRAMUSCULAR | Status: AC
  Filled 2018-04-17: qty 1

## 2018-04-17 MED ORDER — CEFAZOLIN SODIUM-DEXTROSE 1-4 GM/50ML-% IV SOLN
INTRAVENOUS | Status: AC
  Administered 2018-04-17: 1 g via INTRAVENOUS
  Filled 2018-04-17: qty 50

## 2018-04-17 MED ORDER — FENTANYL CITRATE (PF) 100 MCG/2ML IJ SOLN
INTRAMUSCULAR | Status: AC
  Filled 2018-04-17: qty 2

## 2018-04-17 MED ORDER — ONDANSETRON HCL 4 MG/2ML IJ SOLN: 4.0000 mg | Freq: Four times a day (QID) | INTRAMUSCULAR | Status: DC | PRN

## 2018-04-17 MED ORDER — HEPARIN (PORCINE) IN NACL 1000-0.9 UT/500ML-% IV SOLN
INTRAVENOUS | Status: AC
  Filled 2018-04-17: qty 1000

## 2018-04-17 MED ORDER — FAMOTIDINE 20 MG PO TABS: 40.0000 mg | ORAL_TABLET | ORAL | Status: DC | PRN

## 2018-04-17 MED ORDER — FENTANYL CITRATE (PF) 100 MCG/2ML IJ SOLN
INTRAMUSCULAR | Status: DC | PRN
  Administered 2018-04-17 (×2): 50 ug via INTRAVENOUS

## 2018-04-17 MED ORDER — HYDROMORPHONE HCL 1 MG/ML IJ SOLN: 1.0000 mg | Freq: Once | INTRAMUSCULAR | Status: DC | PRN

## 2018-04-17 MED ORDER — METHYLPREDNISOLONE SODIUM SUCC 125 MG IJ SOLR: 125.0000 mg | INTRAMUSCULAR | Status: DC | PRN

## 2018-04-17 MED ORDER — MIDAZOLAM HCL 5 MG/5ML IJ SOLN
INTRAMUSCULAR | Status: AC
  Filled 2018-04-17: qty 5

## 2018-04-17 MED ORDER — LIDOCAINE-EPINEPHRINE (PF) 1 %-1:200000 IJ SOLN
INTRAMUSCULAR | Status: AC
  Filled 2018-04-17: qty 30

## 2018-04-17 MED ORDER — IOPAMIDOL (ISOVUE-300) INJECTION 61%
INTRAVENOUS | Status: DC | PRN
  Administered 2018-04-17: 30 mL via INTRAVENOUS

## 2018-04-17 MED ORDER — SODIUM CHLORIDE 0.9 % IV SOLN
INTRAVENOUS | Status: DC
  Administered 2018-04-17: 1000 mL via INTRAVENOUS

## 2018-04-17 MED ORDER — MIDAZOLAM HCL 2 MG/2ML IJ SOLN
INTRAMUSCULAR | Status: DC | PRN
  Administered 2018-04-17: 2 mg via INTRAVENOUS
  Administered 2018-04-17: 1 mg via INTRAVENOUS

## 2018-04-17 SURGICAL SUPPLY — 11 items
BALLN LUTONIX DCB 7X60X130 (BALLOONS) ×3
BALLN ULTRVRSE 8X40X75C (BALLOONS) ×3
BALLOON LUTONIX DCB 7X60X130 (BALLOONS) ×1 IMPLANT
BALLOON ULTRVRSE 8X40X75C (BALLOONS) ×1 IMPLANT
CANNULA 5F STIFF (CANNULA) ×3 IMPLANT
DEVICE PRESTO INFLATION (MISCELLANEOUS) ×3 IMPLANT
DRAPE BRACHIAL (DRAPES) ×3 IMPLANT
PACK ANGIOGRAPHY (CUSTOM PROCEDURE TRAY) ×3 IMPLANT
SHEATH BRITE TIP 6FRX5.5 (SHEATH) ×3 IMPLANT
SUT MNCRL AB 4-0 PS2 18 (SUTURE) ×3 IMPLANT
WIRE MAGIC TORQUE 260C (WIRE) ×3 IMPLANT

## 2018-04-17 NOTE — H&P (Signed)
[facility] History & Physical Update  The patient was interviewed and re-examined.  The patient's previous History and Physical has been reviewed and is unchanged.  There is no change in the plan of care. We plan to proceed with the scheduled procedure.  Leotis Pain, MD  04/17/2018, 11:10 AM

## 2018-04-17 NOTE — Op Note (Signed)
[facility]    OPERATIVE NOTE   PROCEDURE: 1.   Left brachiocephalic arteriovenous fistula cannulation under ultrasound guidance 2.   Left arm fistulagram including central venogram 3.   Percutaneous transluminal angioplasty of left proximal to mid upper arm cephalic vein with 7 mm diameter drug-coated and 8 mm diameter conventional angioplasty balance  PRE-OPERATIVE DIAGNOSIS: 1. ESRD 2. Poorly functional left brachiocephalic AVF  POST-OPERATIVE DIAGNOSIS: same as above   SURGEON: Leotis Pain, MD  ANESTHESIA: local with MCS  ESTIMATED BLOOD LOSS: 5 cc  FINDING(S): 1. Well matured AV fistula with a focal stenosis in the proximal to mid upper arm cephalic vein of about 16-10%.  The remainder of the central venous circulation and cephalic vein appeared to be widely patent.  The anastomosis was not easy to evaluate due to collateral vein branches limiting contrast at the anastomosis but it appeared widely patent on her ultrasound today.  SPECIMEN(S):  None  CONTRAST: 30 cc  FLUORO TIME: 1.3 minutes  MODERATE CONSCIOUS SEDATION TIME: Approximately 15 minutes with 3 mg of Versed and 100 Mcg of Fentanyl   INDICATIONS: Janet Olson is a 48 y.o. female who presents with malfunctioning left brachiocephalic arteriovenous fistula.  The patient is scheduled for left arm fistulagram.  The patient is aware the risks include but are not limited to: bleeding, infection, thrombosis of the cannulated access, and possible anaphylactic reaction to the contrast.  The patient is aware of the risks of the procedure and elects to proceed forward.  DESCRIPTION: After full informed written consent was obtained, the patient was brought back to the angiography suite and placed supine upon the angiography table.  The patient was connected to monitoring equipment. Moderate conscious sedation was administered with a face to face encounter with the patient throughout the  procedure with my supervision of the RN administering medicines and monitoring the patient's vital signs and mental status throughout from the start of the procedure until the patient was taken to the recovery room. The left arm was prepped and draped in the standard fashion for a percutaneous access intervention.  Under ultrasound guidance, the left brachiocephalic arteriovenous fistula was cannulated with a micropuncture needle under direct ultrasound guidance and a permanent image was performed.  The microwire was advanced into the fistula and the needle was exchanged for the a microsheath.  I then upsized to a 6 Fr Sheath and imaging was performed.  Hand injections were completed to image the access including the central venous system. This demonstrated well matured AV fistula with a focal stenosis in the proximal to mid upper arm cephalic vein of about 96-04%.  The remainder of the central venous circulation and cephalic vein appeared to be widely patent.  The anastomosis was not easy to evaluate due to collateral vein branches limiting contrast at the anastomosis but it appeared widely patent on her ultrasound today.  Based on the images, this patient will need intervention to this focal stenosis. I then gave the patient 3000 units of intravenous heparin.  I then crossed the stenosis with a Magic Tourqe wire.  Based on the imaging, a 7 mm x 6 cm Lutonix drug-coated angioplasty balloon was selected.  The balloon was centered around the proximal to mid upper arm cephalic vein stenosis and inflated to 14 ATM for 1 minute(s).  It was slightly undersized, so an 8 mm diameter by 4 cm conventional angioplasty balloon was selected and inflated to 16 atm.  On completion imaging, a 20-25 %  residual stenosis was present.     Based on the completion imaging, no further intervention is necessary.  The wire and balloon were removed from the sheath.  A 4-0 Monocryl purse-string suture was sewn around the sheath.  The  sheath was removed while tying down the suture.  A sterile bandage was applied to the puncture site.  COMPLICATIONS: None  CONDITION: Stable   Leotis Pain  04/17/2018 11:49 AM   This note was created with Dragon Medical transcription system. Any errors in dictation are purely unintentional.

## 2018-04-18 ENCOUNTER — Encounter: Payer: Self-pay | Admitting: Vascular Surgery

## 2018-04-21 ENCOUNTER — Inpatient Hospital Stay
Admission: EM | Admit: 2018-04-21 | Discharge: 2018-04-22 | DRG: 189 | Disposition: A | Discharge status: Home / Self Care | Payer: Medicaid Other | Attending: Internal Medicine | Admitting: Internal Medicine

## 2018-04-21 ENCOUNTER — Other Ambulatory Visit: Payer: Self-pay

## 2018-04-21 ENCOUNTER — Emergency Department: Payer: Medicaid Other

## 2018-04-21 DIAGNOSIS — F17210 Nicotine dependence, cigarettes, uncomplicated: Secondary | ICD-10-CM | POA: Diagnosis present

## 2018-04-21 DIAGNOSIS — I509 Heart failure, unspecified: Secondary | ICD-10-CM | POA: Diagnosis present

## 2018-04-21 DIAGNOSIS — E1122 Type 2 diabetes mellitus with diabetic chronic kidney disease: Secondary | ICD-10-CM | POA: Diagnosis present

## 2018-04-21 DIAGNOSIS — Z992 Dependence on renal dialysis: Secondary | ICD-10-CM

## 2018-04-21 DIAGNOSIS — N186 End stage renal disease: Secondary | ICD-10-CM | POA: Diagnosis present

## 2018-04-21 DIAGNOSIS — D631 Anemia in chronic kidney disease: Secondary | ICD-10-CM | POA: Diagnosis present

## 2018-04-21 DIAGNOSIS — J439 Emphysema, unspecified: Secondary | ICD-10-CM | POA: Diagnosis present

## 2018-04-21 DIAGNOSIS — J9600 Acute respiratory failure, unspecified whether with hypoxia or hypercapnia: Secondary | ICD-10-CM

## 2018-04-21 DIAGNOSIS — Z833 Family history of diabetes mellitus: Secondary | ICD-10-CM | POA: Diagnosis not present

## 2018-04-21 DIAGNOSIS — R0603 Acute respiratory distress: Secondary | ICD-10-CM

## 2018-04-21 DIAGNOSIS — I43 Cardiomyopathy in diseases classified elsewhere: Secondary | ICD-10-CM | POA: Diagnosis present

## 2018-04-21 DIAGNOSIS — I132 Hypertensive heart and chronic kidney disease with heart failure and with stage 5 chronic kidney disease, or end stage renal disease: Secondary | ICD-10-CM | POA: Diagnosis present

## 2018-04-21 DIAGNOSIS — I361 Nonrheumatic tricuspid (valve) insufficiency: Secondary | ICD-10-CM | POA: Diagnosis present

## 2018-04-21 DIAGNOSIS — K219 Gastro-esophageal reflux disease without esophagitis: Secondary | ICD-10-CM | POA: Diagnosis present

## 2018-04-21 DIAGNOSIS — Z7951 Long term (current) use of inhaled steroids: Secondary | ICD-10-CM

## 2018-04-21 DIAGNOSIS — J9601 Acute respiratory failure with hypoxia: Secondary | ICD-10-CM | POA: Diagnosis present

## 2018-04-21 DIAGNOSIS — J9691 Respiratory failure, unspecified with hypoxia: Secondary | ICD-10-CM | POA: Diagnosis present

## 2018-04-21 DIAGNOSIS — R0602 Shortness of breath: Secondary | ICD-10-CM | POA: Diagnosis present

## 2018-04-21 DIAGNOSIS — J810 Acute pulmonary edema: Secondary | ICD-10-CM

## 2018-04-21 DIAGNOSIS — R0902 Hypoxemia: Secondary | ICD-10-CM

## 2018-04-21 DIAGNOSIS — J441 Chronic obstructive pulmonary disease with (acute) exacerbation: Secondary | ICD-10-CM

## 2018-04-21 HISTORY — DX: Respiratory failure, unspecified with hypoxia: J96.91

## 2018-04-21 LAB — BLOOD GAS, ARTERIAL
Acid-base deficit: 3 mmol/L — ABNORMAL HIGH (ref 0.0–2.0)
BICARBONATE: 22 mmol/L (ref 20.0–28.0)
Delivery systems: POSITIVE
Expiratory PAP: 5
FIO2: 0.4
Inspiratory PAP: 10
Mechanical Rate: 12
O2 SAT: 97.1 %
PATIENT TEMPERATURE: 37
PH ART: 7.37 (ref 7.350–7.450)
PO2 ART: 94 mmHg (ref 83.0–108.0)
pCO2 arterial: 38 mmHg (ref 32.0–48.0)

## 2018-04-21 LAB — BASIC METABOLIC PANEL
ANION GAP: 8 (ref 5–15)
BUN: 43 mg/dL — AB (ref 6–20)
CO2: 21 mmol/L — ABNORMAL LOW (ref 22–32)
Calcium: 7.1 mg/dL — ABNORMAL LOW (ref 8.9–10.3)
Chloride: 110 mmol/L (ref 101–111)
Creatinine, Ser: 6.66 mg/dL — ABNORMAL HIGH (ref 0.44–1.00)
GFR calc Af Amer: 8 mL/min — ABNORMAL LOW (ref >60)
GFR calc non Af Amer: 7 mL/min — ABNORMAL LOW (ref >60)
GLUCOSE: 94 mg/dL (ref 65–99)
POTASSIUM: 3.8 mmol/L (ref 3.5–5.1)
Sodium: 139 mmol/L (ref 135–145)

## 2018-04-21 LAB — CBC WITH DIFFERENTIAL/PLATELET
BASOS ABS: 0.1 10*3/uL (ref 0–0.1)
BASOS PCT: 1 %
Eosinophils Absolute: 0.1 10*3/uL (ref 0–0.7)
Eosinophils Relative: 2 %
HEMATOCRIT: 32 % — AB (ref 35.0–47.0)
Hemoglobin: 10.3 g/dL — ABNORMAL LOW (ref 12.0–16.0)
Lymphocytes Relative: 23 %
Lymphs Abs: 1.5 10*3/uL (ref 1.0–3.6)
MCH: 28.6 pg (ref 26.0–34.0)
MCHC: 32.3 g/dL (ref 32.0–36.0)
MCV: 88.5 fL (ref 80.0–100.0)
MONO ABS: 0.6 10*3/uL (ref 0.2–0.9)
Monocytes Relative: 9 %
NEUTROS ABS: 4.3 10*3/uL (ref 1.4–6.5)
NEUTROS PCT: 65 %
Platelets: 326 10*3/uL (ref 150–440)
RBC: 3.61 MIL/uL — ABNORMAL LOW (ref 3.80–5.20)
RDW: 17.3 % — ABNORMAL HIGH (ref 11.5–14.5)
WBC: 6.6 10*3/uL (ref 3.6–11.0)

## 2018-04-21 LAB — BRAIN NATRIURETIC PEPTIDE: B Natriuretic Peptide: 797 pg/mL — ABNORMAL HIGH (ref 0.0–100.0)

## 2018-04-21 LAB — MRSA PCR SCREENING: MRSA by PCR: NEGATIVE

## 2018-04-21 LAB — TSH: TSH: 2.404 u[IU]/mL (ref 0.350–4.500)

## 2018-04-21 LAB — GLUCOSE, CAPILLARY: Glucose-Capillary: 102 mg/dL — ABNORMAL HIGH (ref 65–99)

## 2018-04-21 LAB — TROPONIN I: Troponin I: <0.03 ng/mL (ref <0.03)

## 2018-04-21 MED ORDER — ACETAMINOPHEN 325 MG PO TABS
650.0000 mg | ORAL_TABLET | Freq: Four times a day (QID) | ORAL | Status: DC | PRN
  Administered 2018-04-21 (×2): 650 mg via ORAL
  Filled 2018-04-21 (×2): qty 2

## 2018-04-21 MED ORDER — HEPARIN SODIUM (PORCINE) 5000 UNIT/ML IJ SOLN
5000.0000 [IU] | Freq: Three times a day (TID) | INTRAMUSCULAR | Status: DC
  Administered 2018-04-21 – 2018-04-22 (×3): 5000 [IU] via SUBCUTANEOUS
  Filled 2018-04-21 (×4): qty 1

## 2018-04-21 MED ORDER — ONDANSETRON HCL 4 MG/2ML IJ SOLN: 4.0000 mg | Freq: Four times a day (QID) | INTRAMUSCULAR | Status: DC | PRN

## 2018-04-21 MED ORDER — METOPROLOL TARTRATE 25 MG PO TABS
25.0000 mg | ORAL_TABLET | Freq: Two times a day (BID) | ORAL | Status: DC
  Administered 2018-04-21 – 2018-04-22 (×3): 25 mg via ORAL
  Filled 2018-04-21 (×3): qty 1

## 2018-04-21 MED ORDER — DOCUSATE SODIUM 100 MG PO CAPS
100.0000 mg | ORAL_CAPSULE | Freq: Two times a day (BID) | ORAL | Status: DC
  Administered 2018-04-22: 100 mg via ORAL
  Filled 2018-04-21 (×2): qty 1

## 2018-04-21 MED ORDER — PANTOPRAZOLE SODIUM 40 MG PO TBEC
40.0000 mg | DELAYED_RELEASE_TABLET | Freq: Every day | ORAL | Status: DC
  Administered 2018-04-21 – 2018-04-22 (×2): 40 mg via ORAL
  Filled 2018-04-21 (×2): qty 1

## 2018-04-21 MED ORDER — ALBUTEROL SULFATE (2.5 MG/3ML) 0.083% IN NEBU
2.5000 mg | INHALATION_SOLUTION | RESPIRATORY_TRACT | Status: DC
  Filled 2018-04-21: qty 3

## 2018-04-21 MED ORDER — ONDANSETRON HCL 4 MG PO TABS: 4.0000 mg | ORAL_TABLET | Freq: Four times a day (QID) | ORAL | Status: DC | PRN

## 2018-04-21 MED ORDER — BUMETANIDE 0.25 MG/ML IJ SOLN: 1.0000 mg | Freq: Once | INTRAMUSCULAR | Status: DC

## 2018-04-21 MED ORDER — RENA-VITE PO TABS
1.0000 | ORAL_TABLET | Freq: Every day | ORAL | Status: DC
  Administered 2018-04-21 – 2018-04-22 (×2): 1 via ORAL
  Filled 2018-04-21 (×2): qty 1

## 2018-04-21 MED ORDER — IPRATROPIUM-ALBUTEROL 0.5-2.5 (3) MG/3ML IN SOLN
RESPIRATORY_TRACT | Status: AC
  Filled 2018-04-21: qty 3

## 2018-04-21 MED ORDER — ACETAMINOPHEN 650 MG RE SUPP: 650.0000 mg | Freq: Four times a day (QID) | RECTAL | Status: DC | PRN

## 2018-04-21 MED ORDER — AMLODIPINE BESYLATE 10 MG PO TABS
10.0000 mg | ORAL_TABLET | Freq: Every day | ORAL | Status: DC
  Administered 2018-04-21 – 2018-04-22 (×2): 10 mg via ORAL
  Filled 2018-04-21: qty 2
  Filled 2018-04-21: qty 1

## 2018-04-21 MED ORDER — IPRATROPIUM-ALBUTEROL 0.5-2.5 (3) MG/3ML IN SOLN
3.0000 mL | Freq: Once | RESPIRATORY_TRACT | Status: AC
Start: 2018-04-21 — End: 2018-04-21
  Administered 2018-04-21: 3 mL via RESPIRATORY_TRACT

## 2018-04-21 MED ORDER — DULOXETINE HCL 30 MG PO CPEP
60.0000 mg | ORAL_CAPSULE | Freq: Two times a day (BID) | ORAL | Status: DC
  Administered 2018-04-21 – 2018-04-22 (×3): 60 mg via ORAL
  Filled 2018-04-21 (×3): qty 2

## 2018-04-21 MED ORDER — CINACALCET HCL 30 MG PO TABS
30.0000 mg | ORAL_TABLET | ORAL | Status: DC
  Administered 2018-04-21: 30 mg via ORAL
  Filled 2018-04-21: qty 1

## 2018-04-21 MED ORDER — MOMETASONE FURO-FORMOTEROL FUM 200-5 MCG/ACT IN AERO
2.0000 | INHALATION_SPRAY | Freq: Two times a day (BID) | RESPIRATORY_TRACT | Status: DC
  Administered 2018-04-21 – 2018-04-22 (×3): 2 via RESPIRATORY_TRACT
  Filled 2018-04-21: qty 8.8

## 2018-04-21 MED ORDER — FUROSEMIDE 20 MG PO TABS
40.0000 mg | ORAL_TABLET | Freq: Two times a day (BID) | ORAL | Status: DC
  Administered 2018-04-21 – 2018-04-22 (×3): 40 mg via ORAL
  Filled 2018-04-21 (×3): qty 2

## 2018-04-21 MED ORDER — TIOTROPIUM BROMIDE MONOHYDRATE 18 MCG IN CAPS
18.0000 ug | ORAL_CAPSULE | Freq: Two times a day (BID) | RESPIRATORY_TRACT | Status: DC
  Administered 2018-04-21 – 2018-04-22 (×3): 18 ug via RESPIRATORY_TRACT
  Filled 2018-04-21: qty 5

## 2018-04-21 MED ORDER — VITAMIN D (ERGOCALCIFEROL) 1.25 MG (50000 UNIT) PO CAPS: 50000.0000 [IU] | ORAL_CAPSULE | ORAL | Status: DC

## 2018-04-21 MED ORDER — ALBUTEROL SULFATE (2.5 MG/3ML) 0.083% IN NEBU
2.5000 mg | INHALATION_SOLUTION | RESPIRATORY_TRACT | Status: DC | PRN
  Filled 2018-04-21: qty 3

## 2018-04-21 MED ORDER — LIDOCAINE-PRILOCAINE 2.5-2.5 % EX CREA
1.0000 "application " | TOPICAL_CREAM | Freq: Every day | CUTANEOUS | Status: DC | PRN
  Filled 2018-04-21: qty 5

## 2018-04-21 NOTE — Progress Notes (Signed)
[facility]  ROUNDING NOTE   Subjective:   Ms. Emerie Vanderkolk admitted to [facility] on 04/21/2018 for Shortness of breath [R06.02] Acute pulmonary edema ([place]) [J81.0] Respiratory distress [R06.03] Hypoxia [R09.02] COPD exacerbation ([place]) [J44.1] Acute respiratory failure, unspecified whether with hypoxia or hypercapnia ([place]) [J96.00]  Seen and examined on hemodialysis. UF goal of 2.5 liters. Tolerating treatment well.     HEMODIALYSIS FLOWSHEET:  Blood Flow Rate (mL/min): 400 mL/min Arterial Pressure (mmHg): -180 mmHg Venous Pressure (mmHg): 260 mmHg Transmembrane Pressure (mmHg): 50 mmHg Ultrafiltration Rate (mL/min): 850 mL/min Dialysate Flow Rate (mL/min): 600 ml/min Conductivity: Machine : 13.8 Conductivity: Machine : 13.8 Dialysis Fluid Bolus: Normal Saline Bolus Amount (mL): 250 mL    Objective:  Vital signs in last 24 hours:  Temp:  [98 F (36.7 C)-98.9 F (37.2 C)] 98.9 F (37.2 C) (04/22 1342) Pulse Rate:  [89-99] 95 (04/22 1342) Resp:  [12-24] 21 (04/22 1342) BP: (141-196)/(74-111) 165/101 (04/22 1342) SpO2:  [77 %-100 %] 100 % (04/22 1342) FiO2 (%):  [40 %] 40 % (04/22 0615) Weight:  [102.5 kg (226 lb)-106.2 kg (234 lb 2.1 oz)] 106.2 kg (234 lb 2.1 oz) (04/22 0947)  Weight change:  Filed Weights   04/21/18 0344 04/21/18 0615 04/21/18 0947  Weight: 102.5 kg (226 lb) 103.6 kg (228 lb 6.3 oz) 106.2 kg (234 lb 2.1 oz)    Intake/Output: No intake/output data recorded.   Intake/Output this shift:  Total I/O In: -  Out: 2521 [Other:2521]  Physical Exam: General: NAD,   Head: Normocephalic, atraumatic. Moist oral mucosal membranes  Eyes: Anicteric, PERRL  Neck: Supple, trachea midline  Lungs:  Clear to auscultation  Heart: Regular rate and rhythm  Abdomen:  Soft, nontender,   Extremities: no peripheral edema.  Neurologic: Nonfocal, moving all four extremities  Skin: No lesions  Access: Left AVF    Basic Metabolic Panel: Recent  Labs  Lab 04/21/18 0352  NA 139  K 3.8  CL 110  CO2 21*  GLUCOSE 94  BUN 43*  CREATININE 6.66*  CALCIUM 7.1*    Liver Function Tests: No results for input(s): AST, ALT, ALKPHOS, BILITOT, PROT, ALBUMIN in the last 168 hours. No results for input(s): LIPASE, AMYLASE in the last 168 hours. No results for input(s): AMMONIA in the last 168 hours.  CBC: Recent Labs  Lab 04/21/18 0352  WBC 6.6  NEUTROABS 4.3  HGB 10.3*  HCT 32.0*  MCV 88.5  PLT 326    Cardiac Enzymes: Recent Labs  Lab 04/21/18 0352  TROPONINI <0.03    BNP: Invalid input(s): POCBNP  CBG: Recent Labs  Lab 04/21/18 0612  GLUCAP 102*    Microbiology: Results for orders placed or performed during the hospital encounter of 04/21/18  MRSA PCR Screening     Status: None   Collection Time: 04/21/18  7:00 AM  Result Value Ref Range Status   MRSA by PCR NEGATIVE NEGATIVE Final    Comment:        The GeneXpert MRSA Assay (FDA approved for NASAL specimens only), is one component of a comprehensive MRSA colonization surveillance program. It is not intended to diagnose MRSA infection nor to guide or monitor treatment for MRSA infections. Performed at [facility], [address] [place], [place] [address]     Coagulation Studies: No results for input(s): LABPROT, INR in the last 72 hours.  Urinalysis: No results for input(s): COLORURINE, LABSPEC, PHURINE, GLUCOSEU, HGBUR, BILIRUBINUR, KETONESUR, PROTEINUR, UROBILINOGEN, NITRITE, LEUKOCYTESUR in the last  72 hours.  Invalid input(s): APPERANCEUR    Imaging: Dg Chest Port 1 View  Result Date: 04/21/2018 CLINICAL DATA:  Acute onset of shortness of breath. EXAM: PORTABLE CHEST 1 VIEW COMPARISON:  Chest radiograph performed 05/17/2017 FINDINGS: The lungs are well-aerated. Vascular congestion is noted. Mildly increased interstitial markings raise concern for pulmonary edema. There is no evidence of pleural effusion or pneumothorax. The  cardiomediastinal silhouette is mildly enlarged. No acute osseous abnormalities are seen. IMPRESSION: Vascular congestion and mild cardiomegaly. Mildly increased interstitial markings raise concern for pulmonary edema. Electronically Signed   By: Garald Balding M.D.   On: 04/21/2018 04:28     Medications:    . amLODipine  10 mg Oral Daily  . cinacalcet  30 mg Oral Once per day on Mon Wed Fri  . docusate sodium  100 mg Oral BID  . DULoxetine  60 mg Oral BID  . furosemide  40 mg Oral BID  . heparin  5,000 Units Subcutaneous Q8H  . ipratropium-albuterol      . metoprolol tartrate  25 mg Oral BID  . mometasone-formoterol  2 puff Inhalation BID  . multivitamin  1 tablet Oral Daily  . pantoprazole  40 mg Oral Daily  . tiotropium  18 mcg Inhalation BID  . [START ON 04/27/2018] Vitamin D (Ergocalciferol)  50,000 Units Oral Weekly   acetaminophen **OR** acetaminophen, albuterol, lidocaine-prilocaine, ondansetron **OR** ondansetron (ZOFRAN) IV  Assessment/ Plan:  Ms. Latoia Eyster is a 48 y.o. black female with end stage renal disease on hemodialysis, hypertension, diabetes mellitus type II, COPD, CHF who is admitted to [facility] with pulmonary edema   [facility] MWF [place]. Left AVF EDW 103.5kg  1. End Stage Renal Disease: with pulmonary edema and volume overload. Requiring hemodialysis urgently on admission.  - UF goal of 2.5 liters - Will need to continue challenging ultrafiltration as outpatient.   2. Hypertension: Elevated during treatment. Volume driven.  - amlodipine, furosemide and metoprolol - Discussed fluid restriction.   3. Anemia of chronic kidney disease: hemoglobin 10.3 - EPO with HD treatments.   4. Secondary Hyperparathyroidism: with hypocalcemia - continue cinacalcet.     LOS: 0 Django Nguyen 4/22/20192:23 PM

## 2018-04-21 NOTE — ED Notes (Signed)
Patient placed on bipap

## 2018-04-21 NOTE — Consult Note (Signed)
PULMONARY / CRITICAL CARE MEDICINE   Name: Janet Olson MRN: [id number] DOB: 07-Jul-1970    ADMISSION DATE:  04/21/2018   CONSULTATION DATE:  04/21/2018  REFERRING MD:  Dr. Marcille Blanco  REASON: Acute hypoxic respiratory failure  HISTORY OF PRESENT ILLNESS:  48 y/o AA female with ESRD on HD M-W-F presented to the ED with acute dyspnea.  Patient states that she started feeling short of breath last week, took nebulizers but  symptoms gradually got worse hence she called EMS this morning.  When EMS arrived, patient was hypoxic with SPO2 of 77%.  She was placed on BiPAP, given nebulizer treatments and Solu-Medrol and transferred to the emergency room.  At the ED, patient was wheezing profusely and had increased work of breathing.  A chest x-ray showed diffuse pulmonary edema hence she was admitted to the ICU for further management.   PAST MEDICAL HISTORY :  She  has a past medical history of Acute renal failure (ARF) ([place]) (05/17/2017), Anemia, Asthma, Cardiomegaly, Chronic kidney disease, COPD (chronic obstructive pulmonary disease) ([place]), Dialysis patient ([place]), Dyspnea, Enlarged heart, GERD (gastroesophageal reflux disease), Headache, Hypertension, Nonrheumatic tricuspid (valve) insufficiency, Ulcer, and Vitamin D deficiency.  PAST SURGICAL HISTORY: She  has a past surgical history that includes Cesarean section; excision of lymph nodes (Bilateral, 2014); DIALYSIS/PERMA CATHETER INSERTION (N/A, 07/16/2017); AV fistula placement (Left, 08/08/2017); DIALYSIS/PERMA CATHETER REMOVAL (N/A, 10/29/2017); and A/V Fistulagram (Left, 04/17/2018).  No Known Allergies  No current facility-administered medications on file prior to encounter.    Current Outpatient Medications on File Prior to Encounter  Medication Sig  . albuterol (PROVENTIL HFA;VENTOLIN HFA) 108 (90 Base) MCG/ACT inhaler Inhale 2 puffs into the lungs every 6 (six) hours as needed for wheezing or shortness of breath.  Marland Kitchen albuterol  (PROVENTIL) (2.5 MG/3ML) 0.083% nebulizer solution Take 3 mLs (2.5 mg total) by nebulization 3 (three) times daily. (Patient taking differently: Take 2.5 mg by nebulization every 4 (four) hours as needed for shortness of breath. )  . amLODipine (NORVASC) 10 MG tablet Take 1 tablet (10 mg total) by mouth daily.  . DULERA 200-5 MCG/ACT AERO Inhale 2 puffs into the lungs 2 (two) times daily.  . DULoxetine (CYMBALTA) 60 MG capsule Take 60 mg by mouth 2 (two) times daily.   . furosemide (LASIX) 40 MG tablet take 1 tablet by mouth twice a day  . metoprolol tartrate (LOPRESSOR) 25 MG tablet Take 1 tablet (25 mg total) by mouth 2 (two) times daily.  . multivitamin (RENA-VIT) TABS tablet Take 1 tablet by mouth daily.  . pantoprazole (PROTONIX) 40 MG tablet Take 1 tablet (40 mg total) by mouth daily.  . SENSIPAR 30 MG tablet Take 30 mg by mouth See admin instructions. Mon Wed Fri daily  . tiotropium (SPIRIVA) 18 MCG inhalation capsule Place 1 capsule (18 mcg total) into inhaler and inhale 2 (two) times daily. (Patient taking differently: Place 18 mcg into inhaler and inhale daily as needed. )  . Vitamin D, Ergocalciferol, (DRISDOL) 50000 units CAPS capsule Take 50,000 Units by mouth once a week. SUNDAYS  . lidocaine-prilocaine (EMLA) cream Apply 1 application topically daily as needed [facility] access for dialysis).    FAMILY HISTORY:  Her indicated that her mother is alive. She indicated that her father is deceased.   SOCIAL HISTORY: She  reports that she has been smoking cigarettes.  She has a 15.00 pack-year smoking history. She has never used smokeless tobacco. She reports that she does not drink alcohol or use drugs.  REVIEW OF SYSTEMS:   Constitutional: Negative for fever and chills.  HENT: Negative for congestion and rhinorrhea.  Eyes: Negative for redness and visual disturbance.  Respiratory: Positive for shortness of breath, cough and wheezing; also c/o chest wall pain.  Cardiovascular:  Negative for chest pain and palpitations.  Gastrointestinal: Negative  for nausea , vomiting and abdominal pain and  Loose stools Genitourinary: Negative for dysuria and urgency.  Endocrine: Denies polyuria, polyphagia and heat intolerance Musculoskeletal: Negative for myalgias and arthralgias.  Skin: Negative for pallor and wound.  Neurological: Negative for dizziness and headaches   SUBJECTIVE:   VITAL SIGNS: BP (!) 144/111 (BP Location: Right Arm)   Pulse 97   Temp 98 F (36.7 C) (Oral)   Resp 19   Ht 5\' 4"  (1.626 m)   Wt 226 lb (102.5 kg)   LMP 03/22/2018   SpO2 96%   BMI 38.79 kg/m   HEMODYNAMICS:    VENTILATOR SETTINGS: FiO2 (%):  [40 %] 40 %  INTAKE / OUTPUT: No intake/output data recorded.  PHYSICAL EXAMINATION: General: NAD Neuro:  AAO X4, CN intact HEENT: PERRLA, neck is supple, no JVD Cardiovascular: Apical pulse regular, S1-S2, no murmur regurg or gallop, +2 pulses bilaterally, no edema Lungs: Bilateral breath sounds, diminished in the bases, no crackles wheezes or rhonchi Abdomen: Distended, normal bowel sounds in all 4  quadrants  musculoskeletal: No joint deformities, positive range of motion Skin: Warm and dry  LABS:  BMET Recent Labs  Lab 04/21/18 0352  NA 139  K 3.8  CL 110  CO2 21*  BUN 43*  CREATININE 6.66*  GLUCOSE 94    Electrolytes Recent Labs  Lab 04/21/18 0352  CALCIUM 7.1*    CBC Recent Labs  Lab 04/21/18 0352  WBC 6.6  HGB 10.3*  HCT 32.0*  PLT 326    Coag's No results for input(s): APTT, INR in the last 168 hours.  Sepsis Markers No results for input(s): LATICACIDVEN, PROCALCITON, O2SATVEN in the last 168 hours.  ABG Recent Labs  Lab 04/21/18 0510  PHART 7.37  PCO2ART 38  PO2ART 94    Liver Enzymes No results for input(s): AST, ALT, ALKPHOS, BILITOT, ALBUMIN in the last 168 hours.  Cardiac Enzymes Recent Labs  Lab 04/21/18 0352  TROPONINI <0.03    Glucose Recent Labs  Lab 04/21/18 0612   GLUCAP 102*    Imaging Dg Chest Port 1 View  Result Date: 04/21/2018 CLINICAL DATA:  Acute onset of shortness of breath. EXAM: PORTABLE CHEST 1 VIEW COMPARISON:  Chest radiograph performed 05/17/2017 FINDINGS: The lungs are well-aerated. Vascular congestion is noted. Mildly increased interstitial markings raise concern for pulmonary edema. There is no evidence of pleural effusion or pneumothorax. The cardiomediastinal silhouette is mildly enlarged. No acute osseous abnormalities are seen. IMPRESSION: Vascular congestion and mild cardiomegaly. Mildly increased interstitial markings raise concern for pulmonary edema. Electronically Signed   By: Garald Balding M.D.   On: 04/21/2018 04:28   DISCUSSION: 48 year old female with end-stage renal disease presenting with acute pulmonary edema and acute hypoxic respiratory failure  ASSESSMENT Acute hypoxic respiratory failure Acute pulmonary edema Acute CHF exacerbation Hypertension End-stage renal disease on hemodialysis Acute COPD exacerbation Tobacco use disorder  PLAN Now off BiPAP and tolerating nasal cannula Hemodialysis per nephrology Inhaled steroids and bronchodilators Continue oral diuretics; Bumex given x2 in the ED Resume all home medications Renal diet Subcu heparin for DVT prophylaxis FAMILY  - Updates: Patient updated on current treatment plan  -Alesandro Stueve S. Patria Mane  ANP-BC Pulmonary and [place] Pager [phone] or [phone]  NB: This document was prepared using Dragon voice recognition software and may include unintentional dictation errors.

## 2018-04-21 NOTE — Progress Notes (Signed)
Report given to HD RN. Pt transported to dialysis via bed by orderly.

## 2018-04-21 NOTE — Progress Notes (Signed)
Post HD assessment    04/21/18 1343  Neurological  Level of Consciousness Alert  Orientation Level Oriented X4  Respiratory  Respiratory Pattern Regular;Dyspnea with exertion  Chest Assessment Chest expansion symmetrical  Bilateral Breath Sounds Diminished  Cardiac  ECG Monitor Yes  Vascular  R Radial Pulse +2  L Radial Pulse +2  Integumentary  Integumentary (WDL) X  Skin Color Appropriate for ethnicity  Musculoskeletal  Musculoskeletal (WDL) X  Generalized Weakness Yes  Assistive Device None  GU Assessment  Genitourinary (WDL) X  Genitourinary Symptoms  (HD)  Psychosocial  Psychosocial (WDL) WDL

## 2018-04-21 NOTE — ED Provider Notes (Signed)
[facility] Emergency Department Provider Note   ____________________________________________   First MD Initiated Contact with Patient 04/21/18 [phone]     (approximate)  I have reviewed the triage vital signs and the nursing notes.   HISTORY  Chief Complaint Respiratory Distress    HPI Janet Olson is a 48 y.o. female to the ED from home via EMS with a chief complaint of respiratory distress.  Patient has a history of COPD, not on continuous oxygen.  Also with a history of ESRD on HD M/W/F who reports increasing shortness of breath over the past week, worse tonight.  She was given 2 albuterol, 1 DuoNeb treatment and 125 IV Solu-Medrol in route to the ED.  Room air saturations are 77%.  Denies recent fever, chills, chest pain, abdominal pain, nausea or vomiting.  Denies recent travel or trauma.   Past Medical History:  Diagnosis Date  . Acute renal failure (ARF) ([place]) 05/17/2017  . Anemia   . Asthma   . Cardiomegaly   . Chronic kidney disease   . COPD (chronic obstructive pulmonary disease) ([place])   . Dialysis patient ([place])    MON, WED , FRI  . Dyspnea   . Enlarged heart   . GERD (gastroesophageal reflux disease)   . Headache    Migraines  . Hypertension   . Nonrheumatic tricuspid (valve) insufficiency   . Ulcer   . Vitamin D deficiency     Patient Active Problem List   Diagnosis Date Noted  . ESRD on dialysis ([place]) 08/02/2017  . COPD (chronic obstructive pulmonary disease) ([place]) 06/20/2017  . Anemia in chronic kidney disease 06/07/2017  . 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 Mass of left axilla 12/07/2013    Past Surgical History:  Procedure Laterality Date  . A/V FISTULAGRAM Left 04/17/2018   Procedure: A/V FISTULAGRAM;  Surgeon: Algernon Huxley, MD;  Location: [place];  Service: Cardiovascular;  Laterality: Left;  . AV FISTULA PLACEMENT Left 08/08/2017   Procedure: ARTERIOVENOUS (AV) FISTULA CREATION (  BRACHIOCEPHALIC );  Surgeon: Algernon Huxley, MD;  Location: [facility] ORS;  Service: Vascular;  Laterality: Left;  . CESAREAN SECTION     x 4 (1988, 1991, 1997, 2000 )  . DIALYSIS/PERMA CATHETER INSERTION N/A 07/16/2017   Procedure: Dialysis/Perma Catheter Insertion;  Surgeon: Katha Cabal, MD;  Location: [place] [facility];  Service: Cardiovascular;  Laterality: N/A;  . DIALYSIS/PERMA CATHETER REMOVAL N/A 10/29/2017   Procedure: DIALYSIS/PERMA CATHETER REMOVAL;  Surgeon: Katha Cabal, MD;  Location: [place] [facility];  Service: Cardiovascular;  Laterality: N/A;  . excision of lymph nodes Bilateral 2014   bilateral under arms.    Prior to Admission medications   Medication Sig Start Date End Date Taking? Authorizing Provider  albuterol (PROVENTIL HFA;VENTOLIN HFA) 108 (90 Base) MCG/ACT inhaler Inhale 2 puffs into the lungs every 6 (six) hours as needed for wheezing or shortness of breath. 06/20/17  Yes Johnson, Megan P, DO  albuterol (PROVENTIL) (2.5 MG/3ML) 0.083% nebulizer solution Take 3 mLs (2.5 mg total) by nebulization 3 (three) times daily. Patient taking differently: Take 2.5 mg by nebulization every 4 (four) hours as needed for shortness of breath.  06/20/17  Yes Johnson, Megan P, DO  amLODipine (NORVASC) 10 MG tablet Take 1 tablet (10 mg total) by mouth daily. 06/12/17  Yes Doles-Johnson, Teah, NP  DULERA 200-5 MCG/ACT AERO Inhale 2 puffs into the lungs 2 (two) times daily. 01/16/18  Yes [provider]  DULoxetine (CYMBALTA) 60 MG capsule Take 60 mg by mouth 2 (two) times daily.  12/05/17  Yes [provider]  furosemide (LASIX) 40 MG tablet take 1 tablet by mouth twice a day 11/08/17  Yes Johnson, Megan P, DO  metoprolol tartrate (LOPRESSOR) 25 MG tablet Take 1 tablet (25 mg total) by mouth 2 (two) times daily. 06/12/17  Yes Doles-Johnson, Teah, NP  multivitamin (RENA-VIT) TABS tablet Take 1 tablet by mouth daily. 08/23/17  Yes [provider]    pantoprazole (PROTONIX) 40 MG tablet Take 1 tablet (40 mg total) by mouth daily. 02/22/18  Yes Paulette Blanch, MD  SENSIPAR 30 MG tablet Take 30 mg by mouth See admin instructions. Mon Wed Fri daily 04/07/18  Yes [provider]  tiotropium (SPIRIVA) 18 MCG inhalation capsule Place 1 capsule (18 mcg total) into inhaler and inhale 2 (two) times daily. Patient taking differently: Place 18 mcg into inhaler and inhale daily as needed.  06/12/17  Yes Doles-Johnson, Teah, NP  Vitamin D, Ergocalciferol, (DRISDOL) 50000 units CAPS capsule Take 50,000 Units by mouth once a week. SUNDAYS 10/22/17  Yes [provider]  lidocaine-prilocaine (EMLA) cream Apply 1 application topically daily as needed [facility] access for dialysis).    [provider]    Allergies Patient has no known allergies.  Family History  Problem Relation Age of Onset  . Diabetes Mother     Social History Social History   Tobacco Use  . Smoking status: Current Every Day Smoker    Packs/day: 0.50    Years: 30.00    Pack years: 15.00    Types: Cigarettes  . Smokeless tobacco: Never Used  Substance Use Topics  . Alcohol use: No  . Drug use: No    Review of Systems  Constitutional: No fever/chills. Eyes: No visual changes. ENT: No sore throat. Cardiovascular: Denies chest pain. Respiratory: Positive for shortness of breath. Gastrointestinal: No abdominal pain.  No nausea, no vomiting.  No diarrhea.  No constipation. Genitourinary: Negative for dysuria. Musculoskeletal: Negative for back pain. Skin: Negative for rash. Neurological: Negative for headaches, focal weakness or numbness.   ____________________________________________   PHYSICAL EXAM:  VITAL SIGNS: ED Triage Vitals  Enc Vitals Group     BP 04/21/18 0400 (!) 152/92     Pulse Rate 04/21/18 0343 95     Resp 04/21/18 0343 (!) 24     Temp 04/21/18 0343 98 F (36.7 C)     Temp Source 04/21/18 0343 Oral     SpO2 04/21/18 0343 (!)  77 %     Weight 04/21/18 0344 226 lb (102.5 kg)     Height 04/21/18 0344 5\' 4"  (1.626 m)     Head Circumference --      Peak Flow --      Pain Score 04/21/18 0344 7     Pain Loc --      Pain Edu? --      Excl. in [place]? --     Constitutional: Alert and oriented.  Uncomfortable appearing and in moderate acute distress. Eyes: Conjunctivae are normal. PERRL. EOMI. Head: Atraumatic. Nose: No congestion/rhinnorhea. Mouth/Throat: Mucous membranes are moist.  Oropharynx non-erythematous. Neck: No stridor.   Cardiovascular: Normal rate, regular rhythm. Grossly normal heart sounds.  Good peripheral circulation. Respiratory: Increased respiratory effort.  No retractions. Lungs diminished bibasilarly with rales. Gastrointestinal: Soft and nontender. No distention. No abdominal bruits. No CVA tenderness. Musculoskeletal: No lower extremity tenderness nor edema.  No joint effusions.  Neurologic:  Normal speech and language. No gross focal neurologic deficits are appreciated.  Skin:  Skin is warm, dry and intact. No rash noted. Psychiatric: Mood and affect are normal. Speech and behavior are normal.  ____________________________________________   LABS (all labs ordered are listed, but only abnormal results are displayed)  Labs Reviewed  BASIC METABOLIC PANEL - Abnormal; Notable for the following components:      Result Value   CO2 21 (*)    BUN 43 (*)    Creatinine, Ser 6.66 (*)    Calcium 7.1 (*)    GFR calc non Af Amer 7 (*)    GFR calc Af Amer 8 (*)    All other components within normal limits  CBC WITH DIFFERENTIAL/PLATELET - Abnormal; Notable for the following components:   RBC 3.61 (*)    Hemoglobin 10.3 (*)    HCT 32.0 (*)    RDW 17.3 (*)    All other components within normal limits  TROPONIN I  CBC  BLOOD GAS, ARTERIAL  BRAIN NATRIURETIC PEPTIDE  POC URINE PREG, ED   ____________________________________________  EKG  ED ECG REPORT I, Tanith Dagostino J, the attending  physician, personally viewed and interpreted this ECG.   Date: 04/21/2018  EKG Time: 0347  Rate: 94  Rhythm: normal EKG, normal sinus rhythm  Axis: Normal  Intervals:none  ST&T Change: Nonspecific  ____________________________________________  RADIOLOGY  ED MD interpretation: Pulmonary edema  Official radiology report(s): Dg Chest Port 1 View  Result Date: 04/21/2018 CLINICAL DATA:  Acute onset of shortness of breath. EXAM: PORTABLE CHEST 1 VIEW COMPARISON:  Chest radiograph performed 05/17/2017 FINDINGS: The lungs are well-aerated. Vascular congestion is noted. Mildly increased interstitial markings raise concern for pulmonary edema. There is no evidence of pleural effusion or pneumothorax. The cardiomediastinal silhouette is mildly enlarged. No acute osseous abnormalities are seen. IMPRESSION: Vascular congestion and mild cardiomegaly. Mildly increased interstitial markings raise concern for pulmonary edema. Electronically Signed   By: Garald Balding M.D.   On: 04/21/2018 04:28    ____________________________________________   PROCEDURES  Procedure(s) performed: None  Procedures  Critical Care performed: Yes, see critical care note(s)   CRITICAL CARE Performed by: Paulette Blanch   Total critical care time: 30 minutes  Critical care time was exclusive of separately billable procedures and treating other patients.  Critical care was necessary to treat or prevent imminent or life-threatening deterioration.  Critical care was time spent personally by me on the following activities: development of treatment plan with patient and/or surrogate as well as nursing, discussions with consultants, evaluation of patient's response to treatment, examination of patient, obtaining history from patient or surrogate, ordering and performing treatments and interventions, ordering and review of laboratory studies, ordering and review of radiographic studies, pulse oximetry and re-evaluation of  patient's condition.  ____________________________________________   INITIAL IMPRESSION / ASSESSMENT AND PLAN / ED COURSE  As part of my medical decision making, I reviewed the following data within the [place] notes reviewed and incorporated, Labs reviewed, EKG interpreted, Old chart reviewed, Radiograph reviewed, Discussed with admitting physician Dr. Marcille Blanco and Notes from prior ED visits   48 year old female with COPD and ESRD on HD who presents in moderate respiratory distress with hypoxia. Differential includes, but is not limited to, viral syndrome, bronchitis including COPD exacerbation, pneumonia, reactive airway disease including asthma, CHF including exacerbation with or without pulmonary/interstitial edema, pneumothorax, ACS, thoracic trauma, and pulmonary embolism.   Patient immediately placed on BiPAP upon her arrival  to the ED.  Will obtain screening lab work, chest x-ray.  Anticipate hospitalization.  Clinical Course as of Apr 22 447  Mon Apr 21, 2018  0448 Patient appears more comfortable on BiPAP.  Updated her on all test results.  Discussed with hospitalist who will evaluate patient in the emergency department for admission.   [JS]    Clinical Course User Index [JS] Paulette Blanch, MD     ____________________________________________   FINAL CLINICAL IMPRESSION(S) / ED DIAGNOSES  Final diagnoses:  Acute respiratory failure, unspecified whether with hypoxia or hypercapnia (HCC)  Respiratory distress  COPD exacerbation (HCC)  Acute pulmonary edema (HCC)  Shortness of breath  Hypoxia     ED Discharge Orders    None       Note:  This document was prepared using Dragon voice recognition software and may include unintentional dictation errors.    Paulette Blanch, MD 04/21/18 [phone]

## 2018-04-21 NOTE — Progress Notes (Signed)
HD tx end   04/21/18 1334  Vital Signs  Pulse Rate 94  Pulse Rate Source Monitor  Resp (!) 21  BP (!) 153/87  BP Location Right Arm  BP Method Automatic  Patient Position (if appropriate) Lying  Oxygen Therapy  SpO2 100 %  O2 Device Nasal Cannula  O2 Flow Rate (L/min) 4 L/min  During Hemodialysis Assessment  Dialysis Fluid Bolus Normal Saline  Bolus Amount (mL) 250 mL  Intra-Hemodialysis Comments Tx completed

## 2018-04-21 NOTE — Progress Notes (Signed)
Pre HD assessment    04/21/18 0947  Vital Signs  Temp 98 F (36.7 C)  Temp Source Oral  Pulse Rate 96  Pulse Rate Source Monitor  Resp 18  BP (!) 156/90  BP Location Right Arm  BP Method Automatic  Patient Position (if appropriate) Lying  Oxygen Therapy  SpO2 96 %  O2 Device Nasal Cannula  O2 Flow Rate (L/min) 4 L/min  Pain Assessment  Pain Scale 0-10  Pain Score 0  Dialysis Weight  Weight 106.2 kg (234 lb 2.1 oz)  Type of Weight Pre-Dialysis  Time-Out for Hemodialysis  What Procedure? HD  Pt Identifiers(min of two) First/Last Name;MRN/Account#  Correct Site? Yes  Correct Side? Yes  Correct Procedure? Yes  Consents Verified? Yes  Rad Studies Available? N/A  Safety Precautions Reviewed? Yes  Engineer, civil (consulting) Number  (4A)  Station Number 1  UF/Alarm Test Passed  Conductivity: Meter 13.8  Conductivity: Machine  13.9  pH 7.4  Reverse Osmosis main  Normal Saline Lot Number [id number]  Dialyzer Lot Number [id number]  Disposable Set Lot Number [id number]  Machine Temperature 98.6 F (37 C)  Musician and Audible Yes  Blood Lines Intact and Secured Yes  Pre Treatment Patient Checks  Vascular access used during treatment Fistula  Hepatitis B Surface Antigen Results Negative  Date Hepatitis B Surface Antigen Drawn 02/07/18  Hepatitis B Surface Antibody 576  Date Hepatitis B Surface Antibody Drawn 02/28/18  Hemodialysis Consent Verified Yes  Hemodialysis Standing Orders Initiated Yes  ECG (Telemetry) Monitor On Yes  Prime Ordered Normal Saline  Length of  DialysisTreatment -hour(s) 3.5 Hour(s)  Dialyzer Elisio 17H NR  Dialysate 3K, 2.5 Ca  Dialysis Anticoagulant None  Dialysate Flow Ordered 600  Blood Flow Rate Ordered 400 mL/min  Ultrafiltration Goal 2.5 Liters  Pre Treatment Labs CBC  Dialysis Blood Pressure Support Ordered Normal Saline  Education / Care Plan  Dialysis Education Provided Yes  Documented Education in Care Plan Yes  Fistula / Graft  Left Other (Comment) Arteriovenous fistula  Placement Date/Time: 08/08/17 1614   Orientation: Left  Access Location: (c) Other (Comment)  Access Type: Arteriovenous fistula  Site Condition No complications  Fistula / Graft Assessment Present;Thrill;Bruit  Drainage Description None

## 2018-04-21 NOTE — Progress Notes (Signed)
Post HD assessment. Pt tolerated tx well without c/o or complications. Net UF 2521, goal met.    04/21/18 1342  Vital Signs  Temp 98.9 F (37.2 C)  Temp Source Oral  Pulse Rate 95  Pulse Rate Source Monitor  Resp (!) 21  BP (!) 165/101  BP Location Right Arm  BP Method Automatic  Patient Position (if appropriate) Lying  Oxygen Therapy  SpO2 100 %  O2 Device Nasal Cannula  O2 Flow Rate (L/min) 4 L/min  Post-Hemodialysis Assessment  Rinseback Volume (mL) 250 mL  KECN 78.7 V  Dialyzer Clearance Lightly streaked  Duration of HD Treatment -hour(s) 3.5 hour(s)  Hemodialysis Intake (mL) 500 mL  UF Total -Machine (mL) 3021 mL  Net UF (mL) 2521 mL  Tolerated HD Treatment Yes  AVG/AVF Arterial Site Held (minutes) 10 minutes  AVG/AVF Venous Site Held (minutes) 10 minutes  Education / Care Plan  Dialysis Education Provided Yes  Documented Education in Care Plan Yes  Fistula / Graft Left Other (Comment) Arteriovenous fistula  Placement Date/Time: 08/08/17 1614   Orientation: Left  Access Location: (c) Other (Comment)  Access Type: Arteriovenous fistula  Fistula / Graft Assessment Present;Thrill;Bruit  Status Deaccessed

## 2018-04-21 NOTE — H&P (Addendum)
Janet Olson is an 48 y.o. female.   Chief Complaint: Shortness of breath HPI: The patient with past medical history of end-stage renal disease on dialysis, COPD/asthma and hypertensive cardiomyopathy presents to the emergency department with shortness of breath.  She states that she has become progressively more dyspneic over the last week.  She has a nonproductive cough.  Her albuterol inhaler has not been helping.  The patient's oxygen saturations were 77% per EMS.  They started her on BiPAP which improved her oxygen saturations but she continued to have increased work of breathing.  Chest x-ray showed pulmonary edema.  The patient does make some urine still but receives her dialysis Monday, Wednesday and Friday.  He has not missed any dialysis appointments.  Due to her fluid overload in respiratory distress the emergency department staff called the hospitalist service for further management.  Past Medical History:  Diagnosis Date  . Acute renal failure (ARF) ([place]) 05/17/2017  . Anemia   . Asthma   . Cardiomegaly   . Chronic kidney disease   . COPD (chronic obstructive pulmonary disease) ([place])   . Dialysis patient ([place])    MON, WED , FRI  . Dyspnea   . Enlarged heart   . GERD (gastroesophageal reflux disease)   . Headache    Migraines  . Hypertension   . Nonrheumatic tricuspid (valve) insufficiency   . Ulcer   . Vitamin D deficiency     Past Surgical History:  Procedure Laterality Date  . A/V FISTULAGRAM Left 04/17/2018   Procedure: A/V FISTULAGRAM;  Surgeon: Algernon Huxley, MD;  Location: [facility];  Service: Cardiovascular;  Laterality: Left;  . AV FISTULA PLACEMENT Left 08/08/2017   Procedure: ARTERIOVENOUS (AV) FISTULA CREATION ( BRACHIOCEPHALIC );  Surgeon: Algernon Huxley, MD;  Location: [facility];  Service: Vascular;  Laterality: Left;  . CESAREAN SECTION     x 4 (1988, 1991, 1997, 2000 )  . DIALYSIS/PERMA CATHETER INSERTION N/A 07/16/2017   Procedure:  Dialysis/Perma Catheter Insertion;  Surgeon: Katha Cabal, MD;  Location: [facility];  Service: Cardiovascular;  Laterality: N/A;  . DIALYSIS/PERMA CATHETER REMOVAL N/A 10/29/2017   Procedure: DIALYSIS/PERMA CATHETER REMOVAL;  Surgeon: Katha Cabal, MD;  Location: [facility];  Service: Cardiovascular;  Laterality: N/A;  . excision of lymph nodes Bilateral 2014   bilateral under arms.    Family History  Problem Relation Age of Onset  . Diabetes Mother    Social History:  reports that she has been smoking cigarettes.  She has a 15.00 pack-year smoking history. She has never used smokeless tobacco. She reports that she does not drink alcohol or use drugs.  Allergies: No Known Allergies   (Not in a hospital admission)  Results for orders placed or performed during the hospital encounter of 04/21/18 (from the past 48 hour(s))  Basic metabolic panel     Status: Abnormal   Collection Time: 04/21/18  3:52 AM  Result Value Ref Range   Sodium 139 135 - 145 mmol/L   Potassium 3.8 3.5 - 5.1 mmol/L   Chloride 110 101 - 111 mmol/L   CO2 21 (L) 22 - 32 mmol/L   Glucose, Bld 94 65 - 99 mg/dL   BUN 43 (H) 6 - 20 mg/dL   Creatinine, Ser 6.66 (H) 0.44 - 1.00 mg/dL   Calcium 7.1 (L) 8.9 - 10.3 mg/dL   GFR calc non Af Amer 7 (L) >60 mL/min   GFR calc  Af Amer 8 (L) >60 mL/min    Comment: (NOTE) The eGFR has been calculated using the CKD EPI equation. This calculation has not been validated in all clinical situations. eGFR's persistently <60 mL/min signify possible Chronic Kidney Disease.    Anion gap 8 5 - 15    Comment: Performed at [facility], [address] [place], [place] [address]  Troponin I     Status: None   Collection Time: 04/21/18  3:52 AM  Result Value Ref Range   Troponin I <0.03 <0.03 ng/mL    Comment: Performed at [facility], [address] [place], [place] [address]  CBC with Differential     Status: Abnormal    Collection Time: 04/21/18  3:52 AM  Result Value Ref Range   WBC 6.6 3.6 - 11.0 K/uL   RBC 3.61 (L) 3.80 - 5.20 MIL/uL   Hemoglobin 10.3 (L) 12.0 - 16.0 g/dL   HCT 32.0 (L) 35.0 - 47.0 %   MCV 88.5 80.0 - 100.0 fL   MCH 28.6 26.0 - 34.0 pg   MCHC 32.3 32.0 - 36.0 g/dL   RDW 17.3 (H) 11.5 - 14.5 %   Platelets 326 150 - 440 K/uL   Neutrophils Relative % 65 %   Neutro Abs 4.3 1.4 - 6.5 K/uL   Lymphocytes Relative 23 %   Lymphs Abs 1.5 1.0 - 3.6 K/uL   Monocytes Relative 9 %   Monocytes Absolute 0.6 0.2 - 0.9 K/uL   Eosinophils Relative 2 %   Eosinophils Absolute 0.1 0 - 0.7 K/uL   Basophils Relative 1 %   Basophils Absolute 0.1 0 - 0.1 K/uL    Comment: Performed at [facility], [address] [place], [place] [address]  Brain natriuretic peptide     Status: Abnormal   Collection Time: 04/21/18  3:52 AM  Result Value Ref Range   B Natriuretic Peptide 797.0 (H) 0.0 - 100.0 pg/mL    Comment: Performed at [facility], [address] [place], [place] [address]  Blood gas, arterial     Status: Abnormal   Collection Time: 04/21/18  5:10 AM  Result Value Ref Range   FIO2 0.40    Delivery systems BILEVEL POSITIVE AIRWAY PRESSURE    Inspiratory PAP 10    Expiratory PAP 5    pH, Arterial 7.37 7.350 - 7.450   pCO2 arterial 38 32.0 - 48.0 mmHg   pO2, Arterial 94 83.0 - 108.0 mmHg   Bicarbonate 22.0 20.0 - 28.0 mmol/L   Acid-base deficit 3.0 (H) 0.0 - 2.0 mmol/L   O2 Saturation 97.1 %   Patient temperature 37.0    Collection site RIGHT RADIAL    Sample type ARTERIAL DRAW    Allens test (pass/fail) PASS PASS   Mechanical Rate 12     Comment: Performed at [facility], [address] [place],  [address]   Dg Chest Port 1 View  Result Date: 04/21/2018 CLINICAL DATA:  Acute onset of shortness of breath. EXAM: PORTABLE CHEST 1 VIEW COMPARISON:  Chest radiograph performed 05/17/2017 FINDINGS: The lungs are well-aerated. Vascular congestion is noted.  Mildly increased interstitial markings raise concern for pulmonary edema. There is no evidence of pleural effusion or pneumothorax. The cardiomediastinal silhouette is mildly enlarged. No acute osseous abnormalities are seen. IMPRESSION: Vascular congestion and mild cardiomegaly. Mildly increased interstitial markings raise concern for pulmonary edema. Electronically Signed   By: Garald Balding M.D.   On: 04/21/2018 04:28  Review of Systems  Constitutional: Negative for chills and fever.  HENT: Negative for sore throat and tinnitus.   Eyes: Negative for blurred vision and redness.  Respiratory: Positive for cough, shortness of breath and wheezing. Negative for sputum production.   Cardiovascular: Negative for chest pain, palpitations, orthopnea and PND.  Gastrointestinal: Negative for abdominal pain, diarrhea, nausea and vomiting.  Genitourinary: Negative for dysuria, frequency and urgency.  Musculoskeletal: Negative for joint pain and myalgias.  Skin: Negative for rash.       No lesions  Neurological: Negative for speech change, focal weakness and weakness.  Endo/Heme/Allergies: Does not bruise/bleed easily.       No temperature intolerance  Psychiatric/Behavioral: Negative for depression and suicidal ideas.    Blood pressure (!) 152/92, pulse 91, temperature 98 F (36.7 C), temperature source Oral, resp. rate (!) 21, height 5' 4" (1.626 m), weight 102.5 kg (226 lb), last menstrual period 03/22/2018, SpO2 99 %. Physical Exam  Vitals reviewed. Constitutional: She is oriented to person, place, and time. She appears well-developed and well-nourished. No distress.  HENT:  Head: Normocephalic and atraumatic.  Eyes: Pupils are equal, round, and reactive to light. Conjunctivae and EOM are normal. No scleral icterus.  Neck: Normal range of motion. Neck supple. No JVD present. No tracheal deviation present. No thyromegaly present.  Cardiovascular: Normal rate, regular rhythm and normal heart  sounds. Exam reveals no gallop and no friction rub.  No murmur heard. Respiratory: She is in respiratory distress. She has rales in the right lower field.  GI: Soft. Bowel sounds are normal. She exhibits no distension. There is no tenderness.  Lymphadenopathy:    She has no cervical adenopathy.  Neurological: She is alert and oriented to person, place, and time. No cranial nerve deficit. She exhibits normal muscle tone.  Skin: Skin is warm and dry.  Psychiatric: She has a normal mood and affect. Her behavior is normal. Judgment and thought content normal.     Assessment/Plan This is a 47-year-old female admitted for acute respiratory failure. 1.  Respiratory failure: Acute; with hypoxia.  Secondary to pulmonary edema.  Patient will need dialysis for fluid overload.  She states that they usually do not target a negative fluid balance but instead ultrafiltrate her blood.  She does not have any peripheral edema at this time but she will likely need to pull more fluid on dialysis. 2.  End-stage renal disease: On dialysis; consult nephrology for continuation of hemodialysis.  Continue Sensipar and vitamin D supplementation 3.  Essential hypertension: Uncontrolled; continue metoprolol and amlodipine.  Hydralazine as needed. 4.  Asthma: Stable; albuterol every 4 hours.  Continue Spiriva and inhaled corticosteroid. 5.  DVT prophylaxis: Heparin 6.  GI prophylaxis: None The patient is a full code.  Time spent on admission orders and critical care approximately 45 minutes. [facility] telemedicine notified  ,   S, MD 04/21/2018, 5:54 AM   

## 2018-04-21 NOTE — ED Triage Notes (Signed)
Patient coming from home via [facility]. Patient was 77% on RA for EMS. Patient initially had decreased breath sounds on right. Patient given 2 albuterol treatments. 1 duoneb treatment, and 125 mg of solumedrol in route. Patient's breath sounds no longer diminished after treatments, however, patient has expiratory wheeze on right.

## 2018-04-21 NOTE — Progress Notes (Addendum)
Pt currently rating pain 7/10. Reports she has soreness in her chest and side from coughing, but then later mentioned some back pain that is chronic. Pt only has Tylenol ordered for pain. Maggie, NP aware, no new orders received. Pt given tylenol. Will continue to monitor.

## 2018-04-21 NOTE — Progress Notes (Signed)
I agree with the history and physical and treatment plan by Dr. Marcille Blanco. Patient will go for dialysis today Monitor renal function  wean BiPAP if possible

## 2018-04-21 NOTE — ED Notes (Signed)
Patient placed on 4L [place] for oxygen saturation of 77% on RA. Patient's oxygen saturation increased to 91-92%. Patient's oxygen increased to 5L [place]. Patient's oxygen saturation increased to 95% on 5L. RN will continue to monitor.

## 2018-04-21 NOTE — Progress Notes (Signed)
Pt transported on Bipap from ED to CCU 18. Pt. Tolerated well & transport was uneventful.

## 2018-04-21 NOTE — Progress Notes (Signed)
Pre HD assessment    04/21/18 0948  Neurological  Level of Consciousness Alert  Orientation Level Oriented X4  Respiratory  Respiratory Pattern Regular;Dyspnea with exertion  Chest Assessment Chest expansion symmetrical  Bilateral Breath Sounds Diminished  Vascular  R Radial Pulse +2  L Radial Pulse +2  Integumentary  Integumentary (WDL) X  Skin Color Appropriate for ethnicity  Musculoskeletal  Musculoskeletal (WDL) X  Generalized Weakness Yes  Assistive Device None  Gastrointestinal  Last BM Date  (04/21/18)  GU Assessment  Genitourinary (WDL) X  Genitourinary Symptoms  (HD)  Psychosocial  Psychosocial (WDL) WDL

## 2018-04-21 NOTE — Progress Notes (Signed)
HD tx start    04/21/18 0954  Vital Signs  Pulse Rate 98  Pulse Rate Source Monitor  Resp 16  BP (!) 153/90  BP Location Right Arm  BP Method Automatic  Patient Position (if appropriate) Lying  Oxygen Therapy  SpO2 96 %  O2 Device Nasal Cannula  O2 Flow Rate (L/min) 4 L/min  During Hemodialysis Assessment  Blood Flow Rate (mL/min) 400 mL/min  Arterial Pressure (mmHg) -150 mmHg  Venous Pressure (mmHg) 200 mmHg  Transmembrane Pressure (mmHg) 50 mmHg  Ultrafiltration Rate (mL/min) 860 mL/min  Dialysate Flow Rate (mL/min) 600 ml/min  Conductivity: Machine  13.8  HD Safety Checks Performed Yes  Dialysis Fluid Bolus Normal Saline  Bolus Amount (mL) 250 mL  Intra-Hemodialysis Comments Tx initiated  Fistula / Graft Left Other (Comment) Arteriovenous fistula  Placement Date/Time: 08/08/17 1614   Orientation: Left  Access Location: (c) Other (Comment)  Access Type: Arteriovenous fistula  Status Accessed  Needle Size 15

## 2018-04-22 LAB — BASIC METABOLIC PANEL
Anion gap: 9 (ref 5–15)
BUN: 43 mg/dL — AB (ref 6–20)
CHLORIDE: 99 mmol/L — AB (ref 101–111)
CO2: 29 mmol/L (ref 22–32)
CREATININE: 6.22 mg/dL — AB (ref 0.44–1.00)
Calcium: 8.1 mg/dL — ABNORMAL LOW (ref 8.9–10.3)
GFR calc Af Amer: 8 mL/min — ABNORMAL LOW (ref >60)
GFR calc non Af Amer: 7 mL/min — ABNORMAL LOW (ref >60)
Glucose, Bld: 101 mg/dL — ABNORMAL HIGH (ref 65–99)
Potassium: 4 mmol/L (ref 3.5–5.1)
SODIUM: 137 mmol/L (ref 135–145)

## 2018-04-22 NOTE — Progress Notes (Signed)
[facility]  ROUNDING NOTE   Subjective:   Hemodialysis treatment yesterday. Tolerated treatment well. UF 2.5 liters.   Objective:  Vital signs in last 24 hours:  Temp:  [98.2 F (36.8 C)-98.9 F (37.2 C)] 98.2 F (36.8 C) (04/23 0433) Pulse Rate:  [88-99] 88 (04/23 0433) Resp:  [14-21] 18 (04/23 0433) BP: (142-196)/(81-101) 142/85 (04/23 0433) SpO2:  [92 %-100 %] 93 % (04/23 0433) Weight:  [99.7 kg (219 lb 12.8 oz)] 99.7 kg (219 lb 12.8 oz) (04/23 0500)  Weight change: 3.687 kg (8 lb 2.1 oz) Filed Weights   04/21/18 0615 04/21/18 0947 04/22/18 0500  Weight: 103.6 kg (228 lb 6.3 oz) 106.2 kg (234 lb 2.1 oz) 99.7 kg (219 lb 12.8 oz)    Intake/Output: I/O last 3 completed shifts: In: 0  Out: 3421 [Urine:900; Other:2521]   Intake/Output this shift:  Total I/O In: 120 [P.O.:120] Out: -   Physical Exam: General: NAD,   Head: Normocephalic, atraumatic. Moist oral mucosal membranes  Eyes: Anicteric, PERRL  Neck: Supple, trachea midline  Lungs:  Clear to auscultation  Heart: Regular rate and rhythm  Abdomen:  Soft, nontender,   Extremities: no peripheral edema.  Neurologic: Nonfocal, moving all four extremities  Skin: No lesions  Access: Left AVF    Basic Metabolic Panel: Recent Labs  Lab 04/21/18 0352 04/22/18 0419  NA 139 137  K 3.8 4.0  CL 110 99*  CO2 21* 29  GLUCOSE 94 101*  BUN 43* 43*  CREATININE 6.66* 6.22*  CALCIUM 7.1* 8.1*    Liver Function Tests: No results for input(s): AST, ALT, ALKPHOS, BILITOT, PROT, ALBUMIN in the last 168 hours. No results for input(s): LIPASE, AMYLASE in the last 168 hours. No results for input(s): AMMONIA in the last 168 hours.  CBC: Recent Labs  Lab 04/21/18 0352  WBC 6.6  NEUTROABS 4.3  HGB 10.3*  HCT 32.0*  MCV 88.5  PLT 326    Cardiac Enzymes: Recent Labs  Lab 04/21/18 0352  TROPONINI <0.03    BNP: Invalid input(s): POCBNP  CBG: Recent Labs  Lab 04/21/18 0612  GLUCAP 102*     Microbiology: Results for orders placed or performed during the hospital encounter of 04/21/18  MRSA PCR Screening     Status: None   Collection Time: 04/21/18  7:00 AM  Result Value Ref Range Status   MRSA by PCR NEGATIVE NEGATIVE Final    Comment:        The GeneXpert MRSA Assay (FDA approved for NASAL specimens only), is one component of a comprehensive MRSA colonization surveillance program. It is not intended to diagnose MRSA infection nor to guide or monitor treatment for MRSA infections. Performed at [facility], [address] [place], [place] [address]     Coagulation Studies: No results for input(s): LABPROT, INR in the last 72 hours.  Urinalysis: No results for input(s): COLORURINE, LABSPEC, PHURINE, GLUCOSEU, HGBUR, BILIRUBINUR, KETONESUR, PROTEINUR, UROBILINOGEN, NITRITE, LEUKOCYTESUR in the last 72 hours.  Invalid input(s): APPERANCEUR    Imaging: Dg Chest Port 1 View  Result Date: 04/21/2018 CLINICAL DATA:  Acute onset of shortness of breath. EXAM: PORTABLE CHEST 1 VIEW COMPARISON:  Chest radiograph performed 05/17/2017 FINDINGS: The lungs are well-aerated. Vascular congestion is noted. Mildly increased interstitial markings raise concern for pulmonary edema. There is no evidence of pleural effusion or pneumothorax. The cardiomediastinal silhouette is mildly enlarged. No acute osseous abnormalities are seen. IMPRESSION: Vascular congestion and mild cardiomegaly. Mildly increased interstitial markings raise  concern for pulmonary edema. Electronically Signed   By: Garald Balding M.D.   On: 04/21/2018 04:28     Medications:    . amLODipine  10 mg Oral Daily  . cinacalcet  30 mg Oral Once per day on Mon Wed Fri  . docusate sodium  100 mg Oral BID  . DULoxetine  60 mg Oral BID  . furosemide  40 mg Oral BID  . heparin  5,000 Units Subcutaneous Q8H  . metoprolol tartrate  25 mg Oral BID  . mometasone-formoterol  2 puff Inhalation BID  .  multivitamin  1 tablet Oral Daily  . pantoprazole  40 mg Oral Daily  . tiotropium  18 mcg Inhalation BID  . [START ON 04/27/2018] Vitamin D (Ergocalciferol)  50,000 Units Oral Weekly   acetaminophen **OR** acetaminophen, albuterol, lidocaine-prilocaine, ondansetron **OR** ondansetron (ZOFRAN) IV  Assessment/ Plan:  Ms. Denasia Venn is a 48 y.o. black female with end stage renal disease on hemodialysis, hypertension, diabetes mellitus type II, COPD, CHF who is admitted to [facility] with pulmonary edema   [facility] MWF Cut Off. Left AVF EDW 103.5kg  1. End Stage Renal Disease: with pulmonary edema and volume overload on admission. Requiring hemodialysis urgently on admission.   2. Hypertension: Volume driven.  - amlodipine, furosemide and metoprolol - Discussed fluid restriction.   3. Anemia of chronic kidney disease:  - EPO with HD treatments.   4. Secondary Hyperparathyroidism: with hypocalcemia - continue cinacalcet.     LOS: 1 Tashawn Laswell 4/23/201911:36 AM

## 2018-04-22 NOTE — Care Management (Signed)
Amanda Morris HD liaison notified of admission and discharge 

## 2018-04-22 NOTE — Progress Notes (Signed)
Advanced care plan.  Purpose of the Encounter: CODE STATUS Parties in Attendance: Patient Patient's Decision Capacity: Good Subjective/Patient's story: Presented with shortness of breath and low oxygen saturation Objective/Medical story Had hypoxic respiratory distress secondary to fluid overload Needed urgent dialysis Goals of care determination:  Advance directives were discussed during her stay in the hospital Patient wanted everything done, that is cardiac resuscitation, intubation and ventilator if need arises CODE STATUS: Full code Time spent discussing advanced care planning: 16 minutes

## 2018-04-22 NOTE — Progress Notes (Signed)
04/22/2018 1115  Nelly Rout to be D/C'd Home per MD order.  Discussed prescriptions and follow up appointments with the patient. Prescriptions given to patient, medication list explained in detail. Pt verbalized understanding.  Allergies as of 04/22/2018   No Known Allergies     Medication List    TAKE these medications   albuterol 108 (90 Base) MCG/ACT inhaler Commonly known as:  PROVENTIL HFA;VENTOLIN HFA Inhale 2 puffs into the lungs every 6 (six) hours as needed for wheezing or shortness of breath. What changed:  Another medication with the same name was changed. Make sure you understand how and when to take each.   albuterol (2.5 MG/3ML) 0.083% nebulizer solution Commonly known as:  PROVENTIL Take 3 mLs (2.5 mg total) by nebulization 3 (three) times daily. What changed:    when to take this  reasons to take this   amLODipine 10 MG tablet Commonly known as:  NORVASC Take 1 tablet (10 mg total) by mouth daily.   DULERA 200-5 MCG/ACT Aero Generic drug:  mometasone-formoterol Inhale 2 puffs into the lungs 2 (two) times daily.   DULoxetine 60 MG capsule Commonly known as:  CYMBALTA Take 60 mg by mouth 2 (two) times daily.   furosemide 40 MG tablet Commonly known as:  LASIX take 1 tablet by mouth twice a day   lidocaine-prilocaine cream Commonly known as:  EMLA Apply 1 application topically daily as needed [facility] access for dialysis).   metoprolol tartrate 25 MG tablet Commonly known as:  LOPRESSOR Take 1 tablet (25 mg total) by mouth 2 (two) times daily.   multivitamin Tabs tablet Take 1 tablet by mouth daily.   pantoprazole 40 MG tablet Commonly known as:  PROTONIX Take 1 tablet (40 mg total) by mouth daily.   SENSIPAR 30 MG tablet Generic drug:  cinacalcet Take 30 mg by mouth See admin instructions. Mon Wed Fri daily   tiotropium 18 MCG inhalation capsule Commonly known as:  SPIRIVA Place 1 capsule (18 mcg total) into inhaler and inhale 2  (two) times daily. What changed:    when to take this  reasons to take this   Vitamin D (Ergocalciferol) 50000 units Caps capsule Commonly known as:  DRISDOL Take 50,000 Units by mouth once a week. SUNDAYS       Vitals:   04/21/18 2115 04/22/18 0433  BP: (!) 157/94 (!) 142/85  Pulse: 99 88  Resp: 18 18  Temp: 98.2 F (36.8 C) 98.2 F (36.8 C)  SpO2: 92% 93%    Skin clean, dry and intact without evidence of skin break down, no evidence of skin tears noted. IV catheter discontinued intact. Site without signs and symptoms of complications. Dressing and pressure applied. Pt denies pain at this time. No complaints noted.  An After Visit Summary was printed and given to the patient. Patient escorted via [place], and D/C home via private auto.  Dola Argyle

## 2018-04-22 NOTE — Plan of Care (Signed)
Patient ambulated in the hallway without oxygen with no complications.  Janet Olson

## 2018-04-24 ENCOUNTER — Ambulatory Visit: Payer: Medicaid Other | Admitting: Anesthesiology

## 2018-04-24 ENCOUNTER — Ambulatory Visit
Admission: RE | Admit: 2018-04-24 | Discharge: 2018-04-24 | Disposition: A | Discharge status: Home / Self Care | Payer: Medicaid Other | Source: Ambulatory Visit | Attending: Gastroenterology | Admitting: Gastroenterology

## 2018-04-24 ENCOUNTER — Encounter
Admission: RE | Disposition: A | Discharge status: Home / Self Care | Payer: Self-pay | Source: Ambulatory Visit | Attending: Gastroenterology

## 2018-04-24 ENCOUNTER — Telehealth: Payer: Self-pay | Admitting: Gastroenterology

## 2018-04-24 ENCOUNTER — Encounter: Payer: Self-pay | Admitting: Anesthesiology

## 2018-04-24 DIAGNOSIS — I129 Hypertensive chronic kidney disease with stage 1 through stage 4 chronic kidney disease, or unspecified chronic kidney disease: Secondary | ICD-10-CM | POA: Insufficient documentation

## 2018-04-24 DIAGNOSIS — I120 Hypertensive chronic kidney disease with stage 5 chronic kidney disease or end stage renal disease: Secondary | ICD-10-CM | POA: Insufficient documentation

## 2018-04-24 DIAGNOSIS — K219 Gastro-esophageal reflux disease without esophagitis: Secondary | ICD-10-CM | POA: Insufficient documentation

## 2018-04-24 DIAGNOSIS — K5730 Diverticulosis of large intestine without perforation or abscess without bleeding: Secondary | ICD-10-CM | POA: Diagnosis not present

## 2018-04-24 DIAGNOSIS — D120 Benign neoplasm of cecum: Secondary | ICD-10-CM | POA: Insufficient documentation

## 2018-04-24 DIAGNOSIS — K635 Polyp of colon: Secondary | ICD-10-CM | POA: Diagnosis not present

## 2018-04-24 DIAGNOSIS — K648 Other hemorrhoids: Secondary | ICD-10-CM | POA: Insufficient documentation

## 2018-04-24 DIAGNOSIS — K621 Rectal polyp: Secondary | ICD-10-CM | POA: Diagnosis not present

## 2018-04-24 DIAGNOSIS — N189 Chronic kidney disease, unspecified: Secondary | ICD-10-CM | POA: Insufficient documentation

## 2018-04-24 DIAGNOSIS — E559 Vitamin D deficiency, unspecified: Secondary | ICD-10-CM | POA: Insufficient documentation

## 2018-04-24 DIAGNOSIS — J45909 Unspecified asthma, uncomplicated: Secondary | ICD-10-CM | POA: Insufficient documentation

## 2018-04-24 DIAGNOSIS — K625 Hemorrhage of anus and rectum: Secondary | ICD-10-CM

## 2018-04-24 DIAGNOSIS — J449 Chronic obstructive pulmonary disease, unspecified: Secondary | ICD-10-CM | POA: Insufficient documentation

## 2018-04-24 DIAGNOSIS — F17210 Nicotine dependence, cigarettes, uncomplicated: Secondary | ICD-10-CM | POA: Insufficient documentation

## 2018-04-24 DIAGNOSIS — Z79899 Other long term (current) drug therapy: Secondary | ICD-10-CM | POA: Diagnosis not present

## 2018-04-24 DIAGNOSIS — Z1211 Encounter for screening for malignant neoplasm of colon: Secondary | ICD-10-CM | POA: Diagnosis not present

## 2018-04-24 DIAGNOSIS — N186 End stage renal disease: Secondary | ICD-10-CM | POA: Insufficient documentation

## 2018-04-24 DIAGNOSIS — Z992 Dependence on renal dialysis: Secondary | ICD-10-CM | POA: Diagnosis not present

## 2018-04-24 HISTORY — PX: COLONOSCOPY WITH PROPOFOL: SHX5780

## 2018-04-24 LAB — POCT PREGNANCY, URINE: Preg Test, Ur: NEGATIVE

## 2018-04-24 SURGERY — COLONOSCOPY WITH PROPOFOL
Anesthesia: General

## 2018-04-24 MED ORDER — LIDOCAINE HCL (PF) 2 % IJ SOLN
INTRAMUSCULAR | Status: AC
Start: 2018-04-24 — End: 2018-04-24
  Filled 2018-04-24: qty 20

## 2018-04-24 MED ORDER — FENTANYL CITRATE (PF) 100 MCG/2ML IJ SOLN
INTRAMUSCULAR | Status: AC
  Filled 2018-04-24: qty 2

## 2018-04-24 MED ORDER — MIDAZOLAM HCL 2 MG/2ML IJ SOLN
INTRAMUSCULAR | Status: AC
  Filled 2018-04-24: qty 2

## 2018-04-24 MED ORDER — PROPOFOL 500 MG/50ML IV EMUL
INTRAVENOUS | Status: AC
Start: 2018-04-24 — End: 2018-04-24
  Filled 2018-04-24: qty 50

## 2018-04-24 MED ORDER — SODIUM CHLORIDE 0.9 % IV SOLN
INTRAVENOUS | Status: DC
  Administered 2018-04-24: 15:00:00 via INTRAVENOUS

## 2018-04-24 MED ORDER — LIDOCAINE HCL (PF) 2 % IJ SOLN
INTRAMUSCULAR | Status: AC
  Filled 2018-04-24: qty 10

## 2018-04-24 MED ORDER — SODIUM CHLORIDE 0.9 % IV SOLN: INTRAVENOUS | Status: DC

## 2018-04-24 MED ORDER — LIDOCAINE HCL (PF) 1 % IJ SOLN: 2.0000 mL | Freq: Once | INTRAMUSCULAR | Status: DC

## 2018-04-24 MED ORDER — PROPOFOL 500 MG/50ML IV EMUL
INTRAVENOUS | Status: DC | PRN
  Administered 2018-04-24: 140 ug/kg/min via INTRAVENOUS

## 2018-04-24 MED ORDER — PROPOFOL 10 MG/ML IV BOLUS
INTRAVENOUS | Status: DC | PRN
  Administered 2018-04-24: 70 mg via INTRAVENOUS

## 2018-04-24 NOTE — OR Nursing (Signed)
Patient presented eating ice chips. Procedure delayed until 1330. Physician unavailable until 1500. Patient requested to leave and come back for procedure this afternoon. No IV was started. Patient instructed to remain NPO and return at 1430. Discharged home via ambulation to return at 1430.

## 2018-04-24 NOTE — Op Note (Signed)
[facility] Patient Name: Janet Olson Procedure Date: 04/24/2018 2:53 PM MRN: [id number] Account #: [id number] Date of Birth: 01/18/1970 Admit Type: Outpatient Age: 48 Room: [facility] ENDO ROOM 2 Gender: Female Note Status: Finalized Procedure:            Colonoscopy Indications:          Screening for colorectal malignant neoplasm, This is                        the patient's first colonoscopy Providers:            Lin Landsman MD, MD Referring MD:         Jordan Likes. Lavena Bullion (Referring MD) Medicines:            Propofol per Anesthesia Complications:        No immediate complications. Estimated blood loss: None. Procedure:            Pre-Anesthesia Assessment:                       - Prior to the procedure, a History and Physical was                        performed, and patient medications and allergies were                        reviewed. The patient is competent. The risks and                        benefits of the procedure and the sedation options and                        risks were discussed with the patient. All questions                        were answered and informed consent was obtained.                        Patient identification and proposed procedure were                        verified by the physician, the nurse, the                        anesthesiologist, the anesthetist and the technician in                        the pre-procedure area in the procedure room in the                        endoscopy suite. Mental Status Examination: alert and                        oriented. Airway Examination: normal oropharyngeal                        airway and neck mobility. Respiratory Examination:                        clear to auscultation. CV Examination: normal.  Prophylactic Antibiotics: The patient does not require                        prophylactic antibiotics. Prior Anticoagulants: The          patient has taken aspirin. ASA Grade Assessment: III -                        A patient with severe systemic disease. After reviewing                        the risks and benefits, the patient was deemed in                        satisfactory condition to undergo the procedure. The                        anesthesia plan was to use monitored anesthesia care                        (MAC). Immediately prior to administration of                        medications, the patient was re-assessed for adequacy                        to receive sedatives. The heart rate, respiratory rate,                        oxygen saturations, blood pressure, adequacy of                        pulmonary ventilation, and response to care were                        monitored throughout the procedure. The physical status                        of the patient was re-assessed after the procedure.                       After obtaining informed consent, the colonoscope was                        passed under direct vision. Throughout the procedure,                        the patient's blood pressure, pulse, and oxygen                        saturations were monitored continuously. The                        Colonoscope was introduced through the anus and                        advanced to the the terminal ileum. The colonoscopy was                        performed without difficulty. The patient tolerated the  procedure well. The quality of the bowel preparation                        was evaluated using the BBPS [facility] Bowel Preparation                        Scale) with scores of: Right Colon = 3, Transverse                        Colon = 3 and Left Colon = 3 (entire mucosa seen well                        with no residual staining, small fragments of stool or                        opaque liquid). The total BBPS score equals 9. Findings:      The perianal and digital rectal examinations  were normal. Pertinent       negatives include normal sphincter tone and no palpable rectal lesions.      The terminal ileum appeared normal.      Three sessile polyps were found in the rectum and cecum. The polyps were       diminutive in size. These polyps were removed with a cold biopsy       forceps. Resection and retrieval were complete.      Multiple small-mouthed diverticula were found in the sigmoid colon.       There was no evidence of diverticular bleeding.      Internal hemorrhoids were found during retroflexion. The hemorrhoids       were medium-sized. Impression:           - The examined portion of the ileum was normal.                       - Three diminutive polyps in the rectum and in the                        cecum, removed with a cold biopsy forceps. Resected and                        retrieved.                       - Moderate diverticulosis in the sigmoid colon. There                        was no evidence of diverticular bleeding.                       - Internal hemorrhoids. Recommendation:       - Discharge patient to home.                       - Resume previous diet today.                       - Continue present medications.                       - Await pathology results.                       -  Repeat colonoscopy in 5-10 years for surveillance                        based on path. Procedure Code(s):    --- Professional ---                       [phone], Colonoscopy, flexible; with biopsy, single or                        multiple Diagnosis Code(s):    --- Professional ---                       Z12.11, Encounter for screening for malignant neoplasm                        of colon                       K64.8, Other hemorrhoids                       K62.1, Rectal polyp                       D12.0, Benign neoplasm of cecum                       K57.30, Diverticulosis of large intestine without                        perforation or abscess without bleeding CPT  copyright 2017 American Medical Association. All rights reserved. The codes documented in this report are preliminary and upon coder review may  be revised to meet current compliance requirements. Dr. Ulyess Mort Lin Landsman MD, MD 04/24/2018 3:48:56 PM This report has been signed electronically. Number of Addenda: 0 Note Initiated On: 04/24/2018 2:53 PM Scope Withdrawal Time: 0 hours 11 minutes 47 seconds  Total Procedure Duration: 0 hours 15 minutes 15 seconds       [facility]

## 2018-04-24 NOTE — Anesthesia Post-op Follow-up Note (Signed)
Anesthesia QCDR form completed.        

## 2018-04-24 NOTE — Anesthesia Preprocedure Evaluation (Signed)
Anesthesia Evaluation  Patient identified by MRN, date of birth, ID band Patient awake    Reviewed: Allergy & Precautions, NPO status , Patient's Chart, lab work & pertinent test results, reviewed documented beta blocker date and time   Airway Mallampati: III  TM Distance: >3 FB     Dental  (+) Chipped   Pulmonary shortness of breath, asthma , COPD, Current Smoker,           Cardiovascular hypertension, Pt. on medications and Pt. on home beta blockers      Neuro/Psych  Headaches,    GI/Hepatic GERD  ,  Endo/Other    Renal/GU ESRFRenal disease     Musculoskeletal   Abdominal   Peds  Hematology  (+) anemia ,   Anesthesia Other Findings   Reproductive/Obstetrics                             Anesthesia Physical Anesthesia Plan  ASA: III  Anesthesia Plan: General   Post-op Pain Management:    Induction: Intravenous  PONV Risk Score and Plan:   Airway Management Planned:   Additional Equipment:   Intra-op Plan:   Post-operative Plan:   Informed Consent: I have reviewed the patients History and Physical, chart, labs and discussed the procedure including the risks, benefits and alternatives for the proposed anesthesia with the patient or authorized representative who has indicated his/her understanding and acceptance.     Plan Discussed with: CRNA  Anesthesia Plan Comments:         Anesthesia Quick Evaluation

## 2018-04-24 NOTE — Anesthesia Postprocedure Evaluation (Signed)
Anesthesia Post Note  Patient: Janet Olson  Procedure(s) Performed: COLONOSCOPY WITH PROPOFOL (N/A )  Patient location during evaluation: Endoscopy Anesthesia Type: General Level of consciousness: awake and alert Pain management: pain level controlled Vital Signs Assessment: post-procedure vital signs reviewed and stable Respiratory status: spontaneous breathing, nonlabored ventilation, respiratory function stable and patient connected to nasal cannula oxygen Cardiovascular status: blood pressure returned to baseline and stable Postop Assessment: no apparent nausea or vomiting Anesthetic complications: no     Last Vitals:  Vitals:   04/24/18 1600 04/24/18 1610  BP: (!) 175/93 (!) 149/63  Pulse:    Resp:    Temp:    SpO2:      Last Pain:  Vitals:   04/24/18 1610  TempSrc:   PainSc: 0-No pain                 Ruchy Wildrick S

## 2018-04-24 NOTE — H&P (Signed)
Cephas Darby, MD [address]  [place]  [place], [place] [address]  Main: [phone]  Fax: [phone] Pager: [phone]  Primary Care Physician:  Remi Haggard, FNP Primary Gastroenterologist:  Dr. Cephas Darby  Pre-Procedure History & Physical: HPI:  Janet Olson is a 48 y.o. female is here for an colonoscopy.   Past Medical History:  Diagnosis Date  . Acute renal failure (ARF) ([place]) 05/17/2017  . Anemia   . Asthma   . Cardiomegaly   . Chronic kidney disease   . COPD (chronic obstructive pulmonary disease) ([place])   . Dialysis patient ([place])    MON, WED , FRI  . Dyspnea   . Enlarged heart   . GERD (gastroesophageal reflux disease)   . Headache    Migraines  . Hypertension   . Nonrheumatic tricuspid (valve) insufficiency   . Ulcer   . Vitamin D deficiency     Past Surgical History:  Procedure Laterality Date  . A/V FISTULAGRAM Left 04/17/2018   Procedure: A/V FISTULAGRAM;  Surgeon: Algernon Huxley, MD;  Location: [facility];  Service: Cardiovascular;  Laterality: Left;  . AV FISTULA PLACEMENT Left 08/08/2017   Procedure: ARTERIOVENOUS (AV) FISTULA CREATION ( BRACHIOCEPHALIC );  Surgeon: Algernon Huxley, MD;  Location: [facility] OR[facility];  Service: Vascular;  Laterality: Left;  . CESAREAN SECTION     x 4 (1988, 1991, 1997, 2000 )  . DIALYSIS/PERMA CATHETER INSERTION N/A 07/16/2017   Procedure: Dialysis/Perma Catheter Insertion;  Surgeon: Katha Cabal, MD;  Location: [place] [facility];  Service: Cardiovascular;  Laterality: N/A;  . DIALYSIS/PERMA CATHETER REMOVAL N/A 10/29/2017   Procedure: DIALYSIS/PERMA CATHETER REMOVAL;  Surgeon: Katha Cabal, MD;  Location: [place] [facility];  Service: Cardiovascular;  Laterality: N/A;  . excision of lymph nodes Bilateral 2014   bilateral under arms.    Prior to Admission medications   Medication Sig Start Date End Date Taking? Authorizing Provider  albuterol (PROVENTIL HFA;VENTOLIN HFA) 108  (90 Base) MCG/ACT inhaler Inhale 2 puffs into the lungs every 6 (six) hours as needed for wheezing or shortness of breath. 06/20/17  Yes Johnson, Megan P, DO  albuterol (PROVENTIL) (2.5 MG/3ML) 0.083% nebulizer solution Take 3 mLs (2.5 mg total) by nebulization 3 (three) times daily. Patient taking differently: Take 2.5 mg by nebulization every 4 (four) hours as needed for shortness of breath.  06/20/17   Wynetta Emery, Megan P, DO  amLODipine (NORVASC) 10 MG tablet Take 1 tablet (10 mg total) by mouth daily. 06/12/17   Doles-Johnson, Teah, NP  DULERA 200-5 MCG/ACT AERO Inhale 2 puffs into the lungs 2 (two) times daily. 01/16/18   [provider]  DULoxetine (CYMBALTA) 60 MG capsule Take 60 mg by mouth 2 (two) times daily.  12/05/17   [provider]  furosemide (LASIX) 40 MG tablet take 1 tablet by mouth twice a day 11/08/17   Park Liter P, DO  lidocaine-prilocaine (EMLA) cream Apply 1 application topically daily as needed [facility] access for dialysis).    [provider]  metoprolol tartrate (LOPRESSOR) 25 MG tablet Take 1 tablet (25 mg total) by mouth 2 (two) times daily. 06/12/17   Doles-Johnson, Teah, NP  multivitamin (RENA-VIT) TABS tablet Take 1 tablet by mouth daily. 08/23/17   [provider]  pantoprazole (PROTONIX) 40 MG tablet Take 1 tablet (40 mg total) by mouth daily. 02/22/18   Paulette Blanch, MD  SENSIPAR 30 MG tablet Take 30 mg by mouth  See admin instructions. Mon Wed Fri daily 04/07/18   [provider]  tiotropium (SPIRIVA) 18 MCG inhalation capsule Place 1 capsule (18 mcg total) into inhaler and inhale 2 (two) times daily. Patient taking differently: Place 18 mcg into inhaler and inhale daily as needed.  06/12/17   Doles-Johnson, Teah, NP  Vitamin D, Ergocalciferol, (DRISDOL) 50000 units CAPS capsule Take 50,000 Units by mouth once a week. SUNDAYS 10/22/17   [provider]    Allergies as of 04/24/2018  . (No Known Allergies)    Family  History  Problem Relation Age of Onset  . Diabetes Mother     Social History   Socioeconomic History  . Marital status: Single    Spouse name: Not on file  . Number of children: Not on file  . Years of education: Not on file  . Highest education level: Not on file  Occupational History  . Not on file  Social Needs  . Financial resource strain: Not on file  . Food insecurity:    Worry: Not on file    Inability: Not on file  . Transportation needs:    Medical: Not on file    Non-medical: Not on file  Tobacco Use  . Smoking status: Current Every Day Smoker    Packs/day: 0.50    Years: 30.00    Pack years: 15.00    Types: Cigarettes  . Smokeless tobacco: Never Used  Substance and Sexual Activity  . Alcohol use: No  . Drug use: No  . Sexual activity: Not on file  Lifestyle  . Physical activity:    Days per week: Not on file    Minutes per session: Not on file  . Stress: Not on file  Relationships  . Social connections:    Talks on phone: Not on file    Gets together: Not on file    Attends religious service: Not on file    Active member of club or organization: Not on file    Attends meetings of clubs or organizations: Not on file    Relationship status: Not on file  . Intimate partner violence:    Fear of current or ex partner: Not on file    Emotionally abused: Not on file    Physically abused: Not on file    Forced sexual activity: Not on file  Other Topics Concern  . Not on file  Social History Narrative  . Not on file    Review of Systems: See HPI, otherwise negative ROS  Physical Exam: BP (!) 149/63   Pulse (!) 101 Comment: Simultaneous filing. User may not have seen previous data.  Temp 98.6 F (37 C) (Tympanic) Comment: Simultaneous filing. User may not have seen previous data.  Resp 20 Comment: Simultaneous filing. User may not have seen previous data.  Ht 5\' 4"  (1.626 m)   Wt 219 lb (99.3 kg)   LMP 03/24/2018   SpO2 100% Comment: Simultaneous  filing. User may not have seen previous data.  BMI 37.59 kg/m  General:   Alert,  pleasant and cooperative in NAD Head:  Normocephalic and atraumatic. Neck:  Supple; no masses or thyromegaly. Lungs:  Clear throughout to auscultation.    Heart:  Regular rate and rhythm. Abdomen:  Soft, nontender and nondistended. Normal bowel sounds, without guarding, and without rebound.   Neurologic:  Alert and  oriented x4;  grossly normal neurologically.  Impression/Plan: Janet Olson is here for an colonoscopy to be performed for colon  cancer screening  Risks, benefits, limitations, and alternatives regarding  colonoscopy have been reviewed with the patient.  Questions have been answered.  All parties agreeable.   Sherri Sear, MD  04/24/2018, 4:27 PM

## 2018-04-24 NOTE — H&P (Signed)
Janet Darby, MD [address]  [place]  [place], [place] [address]  Main: [phone]  Fax: [phone] Pager: [phone]  Primary Care Physician:  Remi Haggard, FNP Primary Gastroenterologist:  Dr. Cephas Olson  Pre-Procedure History & Physical: HPI:  Janet Olson is a 48 y.o. female is here for an colonoscopy.   Past Medical History:  Diagnosis Date  . Acute renal failure (ARF) ([place]) 05/17/2017  . Anemia   . Asthma   . Cardiomegaly   . Chronic kidney disease   . COPD (chronic obstructive pulmonary disease) ([place])   . Dialysis patient ([place])    MON, WED , FRI  . Dyspnea   . Enlarged heart   . GERD (gastroesophageal reflux disease)   . Headache    Migraines  . Hypertension   . Nonrheumatic tricuspid (valve) insufficiency   . Ulcer   . Vitamin D deficiency     Past Surgical History:  Procedure Laterality Date  . A/V FISTULAGRAM Left 04/17/2018   Procedure: A/V FISTULAGRAM;  Surgeon: Algernon Huxley, MD;  Location: [facility];  Service: Cardiovascular;  Laterality: Left;  . AV FISTULA PLACEMENT Left 08/08/2017   Procedure: ARTERIOVENOUS (AV) FISTULA CREATION ( BRACHIOCEPHALIC );  Surgeon: Algernon Huxley, MD;  Location: [facility];  Service: Vascular;  Laterality: Left;  . CESAREAN SECTION     x 4 (1988, 1991, 1997, 2000 )  . DIALYSIS/PERMA CATHETER INSERTION N/A 07/16/2017   Procedure: Dialysis/Perma Catheter Insertion;  Surgeon: Katha Cabal, MD;  Location: [place];  Service: Cardiovascular;  Laterality: N/A;  . DIALYSIS/PERMA CATHETER REMOVAL N/A 10/29/2017   Procedure: DIALYSIS/PERMA CATHETER REMOVAL;  Surgeon: Katha Cabal, MD;  Location: [facility];  Service: Cardiovascular;  Laterality: N/A;  . excision of lymph nodes Bilateral 2014   bilateral under arms.    Prior to Admission medications   Medication Sig Start Date End Date Taking? Authorizing Provider  albuterol (PROVENTIL HFA;VENTOLIN HFA) 108  (90 Base) MCG/ACT inhaler Inhale 2 puffs into the lungs every 6 (six) hours as needed for wheezing or shortness of breath. 06/20/17   Johnson, Megan P, DO  albuterol (PROVENTIL) (2.5 MG/3ML) 0.083% nebulizer solution Take 3 mLs (2.5 mg total) by nebulization 3 (three) times daily. Patient taking differently: Take 2.5 mg by nebulization every 4 (four) hours as needed for shortness of breath.  06/20/17   Wynetta Emery, Megan P, DO  amLODipine (NORVASC) 10 MG tablet Take 1 tablet (10 mg total) by mouth daily. 06/12/17   Doles-Johnson, Teah, NP  DULERA 200-5 MCG/ACT AERO Inhale 2 puffs into the lungs 2 (two) times daily. 01/16/18   [provider]  DULoxetine (CYMBALTA) 60 MG capsule Take 60 mg by mouth 2 (two) times daily.  12/05/17   [provider]  furosemide (LASIX) 40 MG tablet take 1 tablet by mouth twice a day 11/08/17   Park Liter P, DO  lidocaine-prilocaine (EMLA) cream Apply 1 application topically daily as needed [facility] access for dialysis).    [provider]  metoprolol tartrate (LOPRESSOR) 25 MG tablet Take 1 tablet (25 mg total) by mouth 2 (two) times daily. 06/12/17   Doles-Johnson, Teah, NP  multivitamin (RENA-VIT) TABS tablet Take 1 tablet by mouth daily. 08/23/17   [provider]  pantoprazole (PROTONIX) 40 MG tablet Take 1 tablet (40 mg total) by mouth daily. 02/22/18   Paulette Blanch, MD  SENSIPAR 30 MG tablet Take 30 mg by mouth  See admin instructions. Mon Wed Fri daily 04/07/18   [provider]  tiotropium (SPIRIVA) 18 MCG inhalation capsule Place 1 capsule (18 mcg total) into inhaler and inhale 2 (two) times daily. Patient taking differently: Place 18 mcg into inhaler and inhale daily as needed.  06/12/17   Doles-Johnson, Teah, NP  Vitamin D, Ergocalciferol, (DRISDOL) 50000 units CAPS capsule Take 50,000 Units by mouth once a week. SUNDAYS 10/22/17   [provider]    Allergies as of 04/16/2018  . (No Known Allergies)    Family History   Problem Relation Age of Onset  . Diabetes Mother     Social History   Socioeconomic History  . Marital status: Single    Spouse name: Not on file  . Number of children: Not on file  . Years of education: Not on file  . Highest education level: Not on file  Occupational History  . Not on file  Social Needs  . Financial resource strain: Not on file  . Food insecurity:    Worry: Not on file    Inability: Not on file  . Transportation needs:    Medical: Not on file    Non-medical: Not on file  Tobacco Use  . Smoking status: Current Every Day Smoker    Packs/day: 0.50    Years: 30.00    Pack years: 15.00    Types: Cigarettes  . Smokeless tobacco: Never Used  Substance and Sexual Activity  . Alcohol use: No  . Drug use: No  . Sexual activity: Not on file  Lifestyle  . Physical activity:    Days per week: Not on file    Minutes per session: Not on file  . Stress: Not on file  Relationships  . Social connections:    Talks on phone: Not on file    Gets together: Not on file    Attends religious service: Not on file    Active member of club or organization: Not on file    Attends meetings of clubs or organizations: Not on file    Relationship status: Not on file  . Intimate partner violence:    Fear of current or ex partner: Not on file    Emotionally abused: Not on file    Physically abused: Not on file    Forced sexual activity: Not on file  Other Topics Concern  . Not on file  Social History Narrative  . Not on file    Review of Systems: See HPI, otherwise negative ROS  Physical Exam: Vitals:   04/24/18 1134  BP: (!) 160/102  Pulse: 83  Resp: 17  Temp: (!) 96.6 F (35.9 C)  SpO2: 97%   General:   Alert,  pleasant and cooperative in NAD Head:  Normocephalic and atraumatic. Neck:  Supple; no masses or thyromegaly. Lungs:  Clear throughout to auscultation.    Heart:  Regular rate and rhythm. Abdomen:  Soft, nontender and nondistended. Normal bowel  sounds, without guarding, and without rebound.   Neurologic:  Alert and  oriented x4;  grossly normal neurologically.  Impression/Plan: Braniya Farrugia is here for an colonoscopy to be performed for colon cancer screening  Risks, benefits, limitations, and alternatives regarding  colonoscopy have been reviewed with the patient.  Questions have been answered.  All parties agreeable.   Sherri Sear, MD  04/24/2018, 4:28 PM

## 2018-04-24 NOTE — Telephone Encounter (Signed)
Left vm to get scheduled for Hemrroid Fu per Dr. Marius Ditch

## 2018-04-24 NOTE — Transfer of Care (Signed)
Immediate Anesthesia Transfer of Care Note  Patient: Janet Olson  Procedure(s) Performed: COLONOSCOPY WITH PROPOFOL (N/A )  Patient Location: PACU  Anesthesia Type:General  Level of Consciousness: awake, alert  and oriented  Airway & Oxygen Therapy: Patient Spontanous Breathing and Patient connected to nasal cannula oxygen  Post-op Assessment: Report given to RN and Post -op Vital signs reviewed and stable  Post vital signs: Reviewed and stable  Last Vitals:  Vitals Value Taken Time  BP 186/86 04/24/2018  3:50 PM  Temp 37 C 04/24/2018  3:50 PM  Pulse 100 04/24/2018  3:50 PM  Resp 25 04/24/2018  3:50 PM  SpO2 97 % 04/24/2018  3:50 PM  Vitals shown include unvalidated device data.  Last Pain:  Vitals:   04/24/18 1550  TempSrc: (P) Tympanic  PainSc:          Complications: No apparent anesthesia complications

## 2018-04-24 NOTE — Anesthesia Preprocedure Evaluation (Deleted)
Anesthesia Evaluation  Patient identified by MRN, date of birth, ID band Patient awake    Reviewed: Allergy & Precautions, NPO status , Patient's Chart, lab work & pertinent test results  History of Anesthesia Complications Negative for: history of anesthetic complications  Airway Mallampati: III       Dental   Pulmonary asthma , neg sleep apnea, COPD,  COPD inhaler, Current Smoker,           Cardiovascular hypertension, Pt. on medications and Pt. on home beta blockers (-) Past MI and (-) CHF (-) dysrhythmias (-) Valvular Problems/Murmurs     Neuro/Psych neg Seizures    GI/Hepatic GERD  Medicated and Controlled,  Endo/Other  neg diabetes  Renal/GU Dialysis and ESRFRenal disease     Musculoskeletal   Abdominal   Peds  Hematology  (+) anemia ,   Anesthesia Other Findings   Reproductive/Obstetrics                            Anesthesia Physical Anesthesia Plan  ASA: IV  Anesthesia Plan: General   Post-op Pain Management:    Induction: Intravenous  PONV Risk Score and Plan: 2 and Propofol infusion and TIVA  Airway Management Planned: Nasal Cannula  Additional Equipment:   Intra-op Plan:   Post-operative Plan:   Informed Consent: I have reviewed the patients History and Physical, chart, labs and discussed the procedure including the risks, benefits and alternatives for the proposed anesthesia with the patient or authorized representative who has indicated his/her understanding and acceptance.     Plan Discussed with:   Anesthesia Plan Comments:         Anesthesia Quick Evaluation

## 2018-04-25 ENCOUNTER — Encounter: Payer: Self-pay | Admitting: Gastroenterology

## 2018-04-25 NOTE — Discharge Summary (Signed)
[facility] [place] NAME: Janet Olson    MR#:  [id number]  DATE OF BIRTH:  02-29-1970  DATE OF ADMISSION:  04/21/2018 ADMITTING PHYSICIAN: Harrie Foreman, MD  DATE OF DISCHARGE: 04/22/2018  1:47 PM  PRIMARY CARE PHYSICIAN: Remi Haggard, FNP   ADMISSION DIAGNOSIS:  Shortness of breath [R06.02] Acute pulmonary edema (HCC) [J81.0] Respiratory distress [R06.03] Hypoxia [R09.02] COPD exacerbation (HCC) [J44.1] Acute respiratory failure, unspecified whether with hypoxia or hypercapnia (HCC) [J96.00]  DISCHARGE DIAGNOSIS:  Active Problems:   Respiratory failure with hypoxia (HCC) Fluid overload Emphysema Respiratory distress resolved End-stage renal disease on dialysis  SECONDARY DIAGNOSIS:   Past Medical History:  Diagnosis Date  . Acute renal failure (ARF) ([place]) 05/17/2017  . Anemia   . Asthma   . Cardiomegaly   . Chronic kidney disease   . COPD (chronic obstructive pulmonary disease) ([place])   . Dialysis patient ([place])    MON, WED , FRI  . Dyspnea   . Enlarged heart   . GERD (gastroesophageal reflux disease)   . Headache    Migraines  . Hypertension   . Nonrheumatic tricuspid (valve) insufficiency   . Ulcer   . Vitamin D deficiency      ADMITTING HISTORY Janet Olson is an 48 y.o. female.   Chief Complaint: Shortness of breath HPI: The patient with past medical history of end-stage renal disease on dialysis, COPD/asthma and hypertensive cardiomyopathy presents to the emergency department with shortness of breath.  She states that she has become progressively more dyspneic over the last week.  She has a nonproductive cough.  Her albuterol inhaler has not been helping.  The patient's oxygen saturations were 77% per EMS.  They started her on BiPAP which improved her oxygen saturations but she continued to have increased work of breathing.  Chest x-ray showed pulmonary edema.  The patient does make some urine  still but receives her dialysis Monday, Wednesday and Friday.  He has not missed any dialysis appointments.  Due to her fluid overload in respiratory distress the emergency department staff called the hospitalist service for further management.  HOSPITAL COURSE:  Patient was admitted initially to stepdown unit.She was put on BiPAP for respiratory distress and hypoxia.  She was evaluated by nephrology and she underwent dialysis.  Excess fluid was removed by dialysis.  Patient's shortness of breath resolved.  Her hypoxia also improved.  She was weaned off BiPAP and transferred to the medical floor.  No fever and chills.  Patient tolerated diet well. CONSULTS OBTAINED:  Treatment Team:  Lavonia Dana, MD  DRUG ALLERGIES:  No Known Allergies  DISCHARGE MEDICATIONS:   Allergies as of 04/22/2018   No Known Allergies     Medication List    TAKE these medications   albuterol 108 (90 Base) MCG/ACT inhaler Commonly known as:  PROVENTIL HFA;VENTOLIN HFA Inhale 2 puffs into the lungs every 6 (six) hours as needed for wheezing or shortness of breath. What changed:  Another medication with the same name was changed. Make sure you understand how and when to take each.   albuterol (2.5 MG/3ML) 0.083% nebulizer solution Commonly known as:  PROVENTIL Take 3 mLs (2.5 mg total) by nebulization 3 (three) times daily. What changed:    when to take this  reasons to take this   amLODipine 10 MG tablet Commonly known as:  NORVASC Take 1 tablet (10 mg total) by mouth daily.   DULERA 200-5 MCG/ACT Aero  Generic drug:  mometasone-formoterol Inhale 2 puffs into the lungs 2 (two) times daily.   DULoxetine 60 MG capsule Commonly known as:  CYMBALTA Take 60 mg by mouth 2 (two) times daily.   furosemide 40 MG tablet Commonly known as:  LASIX take 1 tablet by mouth twice a day   lidocaine-prilocaine cream Commonly known as:  EMLA Apply 1 application topically daily as needed [facility] access for  dialysis).   metoprolol tartrate 25 MG tablet Commonly known as:  LOPRESSOR Take 1 tablet (25 mg total) by mouth 2 (two) times daily.   multivitamin Tabs tablet Take 1 tablet by mouth daily.   pantoprazole 40 MG tablet Commonly known as:  PROTONIX Take 1 tablet (40 mg total) by mouth daily.   SENSIPAR 30 MG tablet Generic drug:  cinacalcet Take 30 mg by mouth See admin instructions. Mon Wed Fri daily   tiotropium 18 MCG inhalation capsule Commonly known as:  SPIRIVA Place 1 capsule (18 mcg total) into inhaler and inhale 2 (two) times daily. What changed:    when to take this  reasons to take this   Vitamin D (Ergocalciferol) 50000 units Caps capsule Commonly known as:  DRISDOL Take 50,000 Units by mouth once a week. SUNDAYS       Today  Patient seen and evaluated on the day of discharge No shortness of breath No fever and chills No chest discomfort Weaned off oxygen Tolerating diet well  VITAL SIGNS:  Blood pressure (!) 142/85, pulse 88, temperature 98.2 F (36.8 C), temperature source Oral, resp. rate 18, height 5\' 4"  (1.626 m), weight 99.7 kg (219 lb 12.8 oz), last menstrual period 03/22/2018, SpO2 93 %.  I/O:  No intake or output data in the 24 hours ending 04/25/18 1053  PHYSICAL EXAMINATION:  Physical Exam  GENERAL:  48 y.o.-year-old patient lying in the bed with no acute distress.  LUNGS: Normal breath sounds bilaterally, no wheezing, rales,rhonchi or crepitation. No use of accessory muscles of respiration.  CARDIOVASCULAR: S1, S2 normal. No murmurs, rubs, or gallops.  ABDOMEN: Soft, non-tender, non-distended. Bowel sounds present. No organomegaly or mass.  NEUROLOGIC: Moves all 4 extremities. PSYCHIATRIC: The patient is alert and oriented x 3.  SKIN: No obvious rash, lesion, or ulcer.   DATA REVIEW:   CBC Recent Labs  Lab 04/21/18 0352  WBC 6.6  HGB 10.3*  HCT 32.0*  PLT 326    Chemistries  Recent Labs  Lab 04/22/18 0419  NA 137  K  4.0  CL 99*  CO2 29  GLUCOSE 101*  BUN 43*  CREATININE 6.22*  CALCIUM 8.1*    Cardiac Enzymes Recent Labs  Lab 04/21/18 0352  TROPONINI <0.03    Microbiology Results  Results for orders placed or performed during the hospital encounter of 04/21/18  MRSA PCR Screening     Status: None   Collection Time: 04/21/18  7:00 AM  Result Value Ref Range Status   MRSA by PCR NEGATIVE NEGATIVE Final    Comment:        The GeneXpert MRSA Assay (FDA approved for NASAL specimens only), is one component of a comprehensive MRSA colonization surveillance program. It is not intended to diagnose MRSA infection nor to guide or monitor treatment for MRSA infections. Performed at [facility], [address] [place], [place] [address]     RADIOLOGY:  No results found.  Follow up with PCP in 1 week.  Management plans discussed with the patient, family and they are in  agreement.  CODE STATUS:  Code Status History    Date Active Date Inactive Code Status Order ID Comments User Context   04/21/2018 0538 04/22/2018 1652 Full Code [id number]  Harrie Foreman, MD ED   05/17/2017 2042 05/19/2017 1402 Full Code [id number]  Vaughan Basta, MD Inpatient    Advance Directive Documentation     Most Recent Value  Type of Advance Directive  Living will  Pre-existing out of facility DNR order (yellow form or pink MOST form)  -  "MOST" Form in Place?  -      TOTAL TIME TAKING CARE OF THIS PATIENT ON DAY OF DISCHARGE: more than 33 minutes.   [name] M.D on 04/25/2018 at 10:53 AM  Between 7am to 6pm - Pager - [phone]  After 6pm go to [email or web address] - password [email or web address] [facility]  Office  [phone]  CC: Primary care physician; Remi Haggard, FNP  Note: This dictation was prepared with Dragon dictation along with smaller phrase technology. Any transcriptional errors that result from this process are unintentional.

## 2018-04-28 ENCOUNTER — Encounter: Payer: Self-pay | Admitting: Gastroenterology

## 2018-04-28 LAB — SURGICAL PATHOLOGY

## 2018-05-18 ENCOUNTER — Other Ambulatory Visit: Payer: Self-pay

## 2018-05-18 ENCOUNTER — Emergency Department
Admission: EM | Admit: 2018-05-18 | Discharge: 2018-05-18 | Disposition: A | Discharge status: Home / Self Care | Payer: Medicaid Other | Attending: Emergency Medicine | Admitting: Emergency Medicine

## 2018-05-18 ENCOUNTER — Encounter: Payer: Self-pay | Admitting: Emergency Medicine

## 2018-05-18 DIAGNOSIS — I120 Hypertensive chronic kidney disease with stage 5 chronic kidney disease or end stage renal disease: Secondary | ICD-10-CM | POA: Diagnosis not present

## 2018-05-18 DIAGNOSIS — R58 Hemorrhage, not elsewhere classified: Secondary | ICD-10-CM | POA: Diagnosis not present

## 2018-05-18 DIAGNOSIS — J449 Chronic obstructive pulmonary disease, unspecified: Secondary | ICD-10-CM | POA: Insufficient documentation

## 2018-05-18 DIAGNOSIS — T829XXA Unspecified complication of cardiac and vascular prosthetic device, implant and graft, initial encounter: Secondary | ICD-10-CM

## 2018-05-18 DIAGNOSIS — F17210 Nicotine dependence, cigarettes, uncomplicated: Secondary | ICD-10-CM | POA: Diagnosis not present

## 2018-05-18 DIAGNOSIS — N186 End stage renal disease: Secondary | ICD-10-CM | POA: Diagnosis not present

## 2018-05-18 DIAGNOSIS — D631 Anemia in chronic kidney disease: Secondary | ICD-10-CM | POA: Diagnosis not present

## 2018-05-18 DIAGNOSIS — Y658 Other specified misadventures during surgical and medical care: Secondary | ICD-10-CM | POA: Diagnosis not present

## 2018-05-18 DIAGNOSIS — Z79899 Other long term (current) drug therapy: Secondary | ICD-10-CM | POA: Insufficient documentation

## 2018-05-18 DIAGNOSIS — T82898A Other specified complication of vascular prosthetic devices, implants and grafts, initial encounter: Secondary | ICD-10-CM | POA: Insufficient documentation

## 2018-05-18 DIAGNOSIS — Z992 Dependence on renal dialysis: Secondary | ICD-10-CM | POA: Diagnosis not present

## 2018-05-18 DIAGNOSIS — R2232 Localized swelling, mass and lump, left upper limb: Secondary | ICD-10-CM | POA: Diagnosis not present

## 2018-05-18 DIAGNOSIS — M79602 Pain in left arm: Secondary | ICD-10-CM | POA: Insufficient documentation

## 2018-05-18 DIAGNOSIS — M7989 Other specified soft tissue disorders: Secondary | ICD-10-CM

## 2018-05-18 LAB — BASIC METABOLIC PANEL
Anion gap: 10 (ref 5–15)
BUN: 55 mg/dL — ABNORMAL HIGH (ref 6–20)
CALCIUM: 8.2 mg/dL — AB (ref 8.9–10.3)
CHLORIDE: 103 mmol/L (ref 101–111)
CO2: 22 mmol/L (ref 22–32)
Creatinine, Ser: 8.26 mg/dL — ABNORMAL HIGH (ref 0.44–1.00)
GFR calc Af Amer: 6 mL/min — ABNORMAL LOW (ref >60)
GFR, EST NON AFRICAN AMERICAN: 5 mL/min — AB (ref >60)
Glucose, Bld: 96 mg/dL (ref 65–99)
POTASSIUM: 4.4 mmol/L (ref 3.5–5.1)
SODIUM: 135 mmol/L (ref 135–145)

## 2018-05-18 LAB — CBC WITH DIFFERENTIAL/PLATELET
BASOS PCT: 2 %
Basophils Absolute: 0.1 10*3/uL (ref 0–0.1)
EOS ABS: 0.2 10*3/uL (ref 0–0.7)
Eosinophils Relative: 4 %
HEMATOCRIT: 35.8 % (ref 35.0–47.0)
Hemoglobin: 11.9 g/dL — ABNORMAL LOW (ref 12.0–16.0)
LYMPHS ABS: 1.1 10*3/uL (ref 1.0–3.6)
Lymphocytes Relative: 22 %
MCH: 30.2 pg (ref 26.0–34.0)
MCHC: 33.3 g/dL (ref 32.0–36.0)
MCV: 90.7 fL (ref 80.0–100.0)
Monocytes Absolute: 0.6 10*3/uL (ref 0.2–0.9)
Monocytes Relative: 11 %
NEUTROS ABS: 3.2 10*3/uL (ref 1.4–6.5)
NEUTROS PCT: 61 %
Platelets: 291 10*3/uL (ref 150–440)
RBC: 3.95 MIL/uL (ref 3.80–5.20)
RDW: 17.4 % — ABNORMAL HIGH (ref 11.5–14.5)
WBC: 5.2 10*3/uL (ref 3.6–11.0)

## 2018-05-18 MED ORDER — DOXYCYCLINE HYCLATE 100 MG PO CAPS: 100.0000 mg | ORAL_CAPSULE | Freq: Two times a day (BID) | ORAL | 0 refills | Status: DC

## 2018-05-18 MED ORDER — OXYCODONE HCL 5 MG PO TABS: 5.0000 mg | ORAL_TABLET | Freq: Four times a day (QID) | ORAL | 0 refills | Status: DC | PRN

## 2018-05-18 NOTE — ED Triage Notes (Signed)
Pt to ED via POV c/o left arm pain and swelling where her fistula is. Pt has redness and warmth in the left upper arm as well as bruising. Pt states that her last dialysis treatment was on Friday, she finished all but 15 minutes of her treatment. Pt states that while having dialysis on Wednesday, they went through her fistula and it infiltrated. Pt is in NAD at this time.

## 2018-05-18 NOTE — ED Provider Notes (Signed)
[facility] Emergency Department Provider Note  ____________________________________________  Time seen: Approximately 12:46 PM  I have reviewed the triage vital signs and the nursing notes.   HISTORY  Chief Complaint Arm Pain    HPI Janet Olson is a 48 y.o. female who complains of left arm pain and swelling that started this morning. She has a history of hypertension and end-stage renal disease on hemodialysis Monday Wednesday Friday. She notes that 4 days ago on Wednesday, when they're cannulating her AV fistula in the left upper arm, they went through the vessel and were infiltrating her arm. She was able to have the cannula adjusted and get her dialysis treatment that day. She had dialysis again on Friday 2 days ago, full session. However this morning she noticed bruising, swelling, pain of the left upper arm when she woke up. Denies fever chills sweats chest pain or shortness of breath. No hand pain or coldness.    worse with movement alleviating factors. Nonradiating. Moderate intensity, aching.  Past Medical History:  Diagnosis Date  . Acute renal failure (ARF) ([place]) 05/17/2017  . Anemia   . Asthma   . Cardiomegaly   . Chronic kidney disease   . COPD (chronic obstructive pulmonary disease) ([place])   . Dialysis patient ([place])    MON, WED , FRI  . Dyspnea   . Enlarged heart   . GERD (gastroesophageal reflux disease)   . Headache    Migraines  . Hypertension   . Nonrheumatic tricuspid (valve) insufficiency   . Ulcer   . Vitamin D deficiency      Patient Active Problem List   Diagnosis Date Noted  . Colon cancer screening   . Respiratory failure with hypoxia ([place]) 04/21/2018  . ESRD on dialysis ([place]) 08/02/2017  . COPD (chronic obstructive pulmonary disease) ([place]) 06/20/2017  . Anemia in chronic kidney disease 06/07/2017  . 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 Mass of left axilla 12/07/2013     Past Surgical History:   Procedure Laterality Date  . A/V FISTULAGRAM Left 04/17/2018   Procedure: A/V FISTULAGRAM;  Surgeon: Algernon Huxley, MD;  Location: [facility];  Service: Cardiovascular;  Laterality: Left;  . AV FISTULA PLACEMENT Left 08/08/2017   Procedure: ARTERIOVENOUS (AV) FISTULA CREATION ( BRACHIOCEPHALIC );  Surgeon: Algernon Huxley, MD;  Location: [facility] ORS;  Service: Vascular;  Laterality: Left;  . CESAREAN SECTION     x 4 (1988, 1991, 1997, 2000 )  . COLONOSCOPY WITH PROPOFOL N/A 04/24/2018   Procedure: COLONOSCOPY WITH PROPOFOL;  Surgeon: Lin Landsman, MD;  Location: [facility] ENDOSCOPY;  Service: Gastroenterology;  Laterality: N/A;  . DIALYSIS/PERMA CATHETER INSERTION N/A 07/16/2017   Procedure: Dialysis/Perma Catheter Insertion;  Surgeon: Katha Cabal, MD;  Location: [facility];  Service: Cardiovascular;  Laterality: N/A;  . DIALYSIS/PERMA CATHETER REMOVAL N/A 10/29/2017   Procedure: DIALYSIS/PERMA CATHETER REMOVAL;  Surgeon: Katha Cabal, MD;  Location: [facility];  Service: Cardiovascular;  Laterality: N/A;  . excision of lymph nodes Bilateral 2014   bilateral under arms.     Prior to Admission medications   Medication Sig Start Date End Date Taking? Authorizing Provider  albuterol (PROVENTIL HFA;VENTOLIN HFA) 108 (90 Base) MCG/ACT inhaler Inhale 2 puffs into the lungs every 6 (six) hours as needed for wheezing or shortness of breath. 06/20/17   Johnson, Megan P, DO  albuterol (PROVENTIL) (2.5 MG/3ML) 0.083% nebulizer solution Take 3 mLs (2.5 mg total) by nebulization  3 (three) times daily. Patient taking differently: Take 2.5 mg by nebulization every 4 (four) hours as needed for shortness of breath.  06/20/17   Wynetta Emery, Megan P, DO  amLODipine (NORVASC) 10 MG tablet Take 1 tablet (10 mg total) by mouth daily. 06/12/17   Doles-Johnson, Teah, NP  doxycycline (VIBRAMYCIN) 100 MG capsule Take 1 capsule (100 mg total) by mouth 2 (two) times daily. 05/18/18   Carrie Mew, MD  DULERA 200-5 MCG/ACT AERO Inhale 2 puffs into the lungs 2 (two) times daily. 01/16/18   [provider]  DULoxetine (CYMBALTA) 60 MG capsule Take 60 mg by mouth 2 (two) times daily.  12/05/17   [provider]  furosemide (LASIX) 40 MG tablet take 1 tablet by mouth twice a day 11/08/17   Park Liter P, DO  lidocaine-prilocaine (EMLA) cream Apply 1 application topically daily as needed [facility] access for dialysis).    [provider]  metoprolol tartrate (LOPRESSOR) 25 MG tablet Take 1 tablet (25 mg total) by mouth 2 (two) times daily. 06/12/17   Doles-Johnson, Teah, NP  multivitamin (RENA-VIT) TABS tablet Take 1 tablet by mouth daily. 08/23/17   [provider]  pantoprazole (PROTONIX) 40 MG tablet Take 1 tablet (40 mg total) by mouth daily. 02/22/18   Paulette Blanch, MD  SENSIPAR 30 MG tablet Take 30 mg by mouth See admin instructions. Mon Wed Fri daily 04/07/18   [provider]  tiotropium (SPIRIVA) 18 MCG inhalation capsule Place 1 capsule (18 mcg total) into inhaler and inhale 2 (two) times daily. Patient taking differently: Place 18 mcg into inhaler and inhale daily as needed.  06/12/17   Doles-Johnson, Teah, NP  Vitamin D, Ergocalciferol, (DRISDOL) 50000 units CAPS capsule Take 50,000 Units by mouth once a week. SUNDAYS 10/22/17   [provider]     Allergies Patient has no known allergies.   Family History  Problem Relation Age of Onset  . Diabetes Mother     Social History Social History   Tobacco Use  . Smoking status: Current Every Day Smoker    Packs/day: 0.50    Years: 30.00    Pack years: 15.00    Types: Cigarettes  . Smokeless tobacco: Never Used  Substance Use Topics  . Alcohol use: No  . Drug use: No    Review of Systems  Constitutional:   No fever or chills.  ENT:   No sore throat. No rhinorrhea. Cardiovascular:   No chest pain or syncope. Respiratory:   No dyspnea or cough. Gastrointestinal:    Negative for abdominal pain, vomiting and diarrhea.  Musculoskeletal:   left upper arm pain as above All other systems reviewed and are negative except as documented above in ROS and HPI.  ____________________________________________   PHYSICAL EXAM:  VITAL SIGNS: ED Triage Vitals  Enc Vitals Group     BP 05/18/18 1025 (!) 156/99     Pulse Rate 05/18/18 1025 88     Resp 05/18/18 1025 16     Temp 05/18/18 1025 98.3 F (36.8 C)     Temp Source 05/18/18 1025 Oral     SpO2 05/18/18 1025 94 %     Weight 05/18/18 1029 215 lb (97.5 kg)     Height 05/18/18 1029 5\' 6"  (1.676 m)     Head Circumference --      Peak Flow --      Pain Score 05/18/18 1026 9     Pain Loc --  Pain Edu? --      Excl. in [place]? --     Vital signs reviewed, nursing assessments reviewed.   Constitutional:   Alert and oriented. Well appearing and in no distress. Eyes:   Conjunctivae are normal. EOMI. PERRL. ENT      Head:   Normocephalic and atraumatic.      Nose:   No congestion/rhinnorhea.       Mouth/Throat:   MMM, no pharyngeal erythema. No peritonsillar mass.       Neck:   No meningismus. Full ROM. Hematological/Lymphatic/Immunilogical:   No cervical lymphadenopathy. Cardiovascular:   RRR. Symmetric bilateral radial and DP pulses.  No murmurs.  Respiratory:   Normal respiratory effort without tachypnea/retractions. Breath sounds are clear and equal bilaterally. No wheezes/rales/rhonchi. Gastrointestinal:   Soft and nontender. Non distended. There is no CVA tenderness.  No rebound, rigidity, or guarding.  Musculoskeletal:   Normal range of motion in all extremities. No joint effusions.  No lower extremity tenderness. there is swelling of the left upper arm. Tissues are soft. There is some slight ecchymosis lateral to the AV fistula on the volar aspect. Strong pulse in the fistula. No induration or warmth. No drainage. No significant tenderness. Bedside portable care ultrasound utilized to visualize the  path of the fistula and soft tissues, no obvious fluid collection or pseudoaneurysm present. There is some cobblestoning appearance of the superficial soft tissues. Color flow does not suggest a pseudoaneurysm. Neurologic:   Normal speech and language.  Motor grossly intact. No acute focal neurologic deficits are appreciated.  Skin:    Skin is warm, dry and intact.redness and bruising of the left upper arm noted as above ____________________________________________    LABS (pertinent positives/negatives) (all labs ordered are listed, but only abnormal results are displayed) Labs Reviewed  CBC WITH DIFFERENTIAL/PLATELET - Abnormal; Notable for the following components:      Result Value   Hemoglobin 11.9 (*)    RDW 17.4 (*)    All other components within normal limits  BASIC METABOLIC PANEL - Abnormal; Notable for the following components:   BUN 55 (*)    Creatinine, Ser 8.26 (*)    Calcium 8.2 (*)    GFR calc non Af Amer 5 (*)    GFR calc Af Amer 6 (*)    All other components within normal limits   ____________________________________________   EKG    ____________________________________________    RADIOLOGY  No results found.  ____________________________________________   PROCEDURES Procedures  ____________________________________________    CLINICAL IMPRESSION / ASSESSMENT AND PLAN / ED COURSE  Pertinent labs & imaging results that were available during my care of the patient were reviewed by me and considered in my medical decision making (see chart for details).    patient well-appearing no acute distress, unremarkable vital signs. Presentation is not consistent with cellulitis, pseudoaneurysm, abscess, fistula occlusion. All discussed with vascular. The patient is suitable for discharge home, will cover with doxycycline given the inflammatory changes although mild to be extra cautious about protecting her from infection.  Clinical Course as of May 18 1245   Sun May 18, 2018  1205 D/w vascular. Recommends close follow up in vascular office where she can have formal studies to eval fistula. Will cover with doxycycline for now. Labs unremarkable. Nl wbc, no fever. Intact pulse.   [PS]    Clinical Course User Index [PS] Carrie Mew, MD     ____________________________________________   FINAL CLINICAL IMPRESSION(S) / ED DIAGNOSES  Final diagnoses:  Left arm pain  Complication of arteriovenous dialysis fistula, initial encounter  Arm swelling     ED Discharge Orders        Ordered    doxycycline (VIBRAMYCIN) 100 MG capsule  2 times daily     05/18/18 1246      Portions of this note were generated with dragon dictation software. Dictation errors may occur despite best attempts at proofreading.    Carrie Mew, MD 05/18/18 1253

## 2018-05-19 ENCOUNTER — Ambulatory Visit (INDEPENDENT_AMBULATORY_CARE_PROVIDER_SITE_OTHER): Payer: Medicaid Other | Admitting: Vascular Surgery

## 2018-05-19 ENCOUNTER — Other Ambulatory Visit (INDEPENDENT_AMBULATORY_CARE_PROVIDER_SITE_OTHER): Payer: Self-pay | Admitting: Vascular Surgery

## 2018-05-19 ENCOUNTER — Encounter (INDEPENDENT_AMBULATORY_CARE_PROVIDER_SITE_OTHER): Payer: Self-pay

## 2018-05-19 ENCOUNTER — Encounter (INDEPENDENT_AMBULATORY_CARE_PROVIDER_SITE_OTHER): Payer: Self-pay | Admitting: Vascular Surgery

## 2018-05-19 ENCOUNTER — Ambulatory Visit (INDEPENDENT_AMBULATORY_CARE_PROVIDER_SITE_OTHER): Payer: Medicaid Other

## 2018-05-19 VITALS — BP 141/88 | HR 78 | Resp 16 | Ht 66.0 in | Wt 217.8 lb

## 2018-05-19 DIAGNOSIS — N185 Chronic kidney disease, stage 5: Secondary | ICD-10-CM | POA: Diagnosis not present

## 2018-05-19 DIAGNOSIS — T829XXS Unspecified complication of cardiac and vascular prosthetic device, implant and graft, sequela: Secondary | ICD-10-CM | POA: Diagnosis not present

## 2018-05-19 DIAGNOSIS — T829XXA Unspecified complication of cardiac and vascular prosthetic device, implant and graft, initial encounter: Secondary | ICD-10-CM | POA: Insufficient documentation

## 2018-05-19 DIAGNOSIS — Z992 Dependence on renal dialysis: Secondary | ICD-10-CM | POA: Diagnosis not present

## 2018-05-19 DIAGNOSIS — N186 End stage renal disease: Secondary | ICD-10-CM | POA: Diagnosis not present

## 2018-05-19 MED ORDER — TRAMADOL HCL 50 MG PO TABS: ORAL_TABLET | ORAL | 0 refills | Status: DC

## 2018-05-19 NOTE — Progress Notes (Signed)
Subjective:    Patient ID: Janet Olson, female    DOB: [birth year], 48 y.o.   MRN: [id number] Chief Complaint  Patient presents with  . Follow-up    [facility] 1 day with HDA   Patient presents sooner than her scheduled HDA follow-up.  The patient was seen in the emergency room over the weekend.  The patient notes being infiltrated at dialysis last week and now presents with a painful and swollen arm.  The patient was given doxycycline during her emergency room visit for a cellulitic appearance to the arm.  The patient notes dialysis staff is having a hard time with cannulation.  She also notes that her left hand hurts during dialysis.  The patient underwent a upper extremity left HDA which was notable for an area of significant stenosis mid UA.  This area has a string sign as well as a dominant competing branch. Patient denies any ulcer formation to the left hand.  Denies any fistula skin breakdown.  Patient denies any fever, nausea vomiting.  Review of Systems  Constitutional: Negative.   HENT: Negative.   Eyes: Negative.   Respiratory: Negative.   Cardiovascular: Negative.   Gastrointestinal: Negative.   Endocrine: Negative.   Genitourinary:       ESRD  Musculoskeletal: Negative.   Skin: Negative.   Allergic/Immunologic: Negative.   Neurological: Negative.   Hematological: Negative.   Psychiatric/Behavioral: Negative.       Objective:   Physical Exam  Constitutional: She is oriented to person, place, and time. She appears well-developed and well-nourished. No distress.  HENT:  Head: Normocephalic and atraumatic.  Right Ear: External ear normal.  Left Ear: External ear normal.  Eyes: Pupils are equal, round, and reactive to light. Conjunctivae and EOM are normal.  Neck: Normal range of motion.  Cardiovascular: Normal rate, regular rhythm, normal heart sounds and intact distal pulses.  Pulses:      Radial pulses are 2+ on the right side, and 2+ on the left side.  Left  upper extremity: Mildly swollen.  Ecchymosis noted to the proximal aspect of the arm.  Cellulitis noted to the distal aspect of the fistula.  Fistula skin is intact.  2+ radial pulse.  Hand is warm.  Pulmonary/Chest: Effort normal and breath sounds normal.  Musculoskeletal: Normal range of motion.  Neurological: She is alert and oriented to person, place, and time.  Skin: Skin is warm and dry. She is not diaphoretic.  Psychiatric: She has a normal mood and affect. Her behavior is normal. Judgment and thought content normal.  Vitals reviewed.  BP (!) 141/88 (BP Location: Right Arm)   Pulse 78   Resp 16   Ht 5\' 6"  (1.676 m)   Wt 217 lb 12.8 oz (98.8 kg)   BMI 35.15 kg/m   Past Medical History:  Diagnosis Date  . Acute renal failure (ARF) ([place]) 05/17/2017  . Anemia   . Asthma   . Cardiomegaly   . Chronic kidney disease   . COPD (chronic obstructive pulmonary disease) ([address])   . Dialysis patient ([place])    MON, WED , FRI  . Dyspnea   . Enlarged heart   . GERD (gastroesophageal reflux disease)   . Headache    Migraines  . Hypertension   . Nonrheumatic tricuspid (valve) insufficiency   . Ulcer   . Vitamin D deficiency    Social History   Socioeconomic History  . Marital status: Single    Spouse name: Not on file  .  Number of children: Not on file  . Years of education: Not on file  . Highest education level: Not on file  Occupational History  . Not on file  Social Needs  . Financial resource strain: Not on file  . Food insecurity:    Worry: Not on file    Inability: Not on file  . Transportation needs:    Medical: Not on file    Non-medical: Not on file  Tobacco Use  . Smoking status: Current Every Day Smoker    Packs/day: 0.50    Years: 30.00    Pack years: 15.00    Types: Cigarettes  . Smokeless tobacco: Never Used  Substance and Sexual Activity  . Alcohol use: No  . Drug use: No  . Sexual activity: Not on file  Lifestyle  . Physical activity:    Days per  week: Not on file    Minutes per session: Not on file  . Stress: Not on file  Relationships  . Social connections:    Talks on phone: Not on file    Gets together: Not on file    Attends religious service: Not on file    Active member of club or organization: Not on file    Attends meetings of clubs or organizations: Not on file    Relationship status: Not on file  . Intimate partner violence:    Fear of current or ex partner: Not on file    Emotionally abused: Not on file    Physically abused: Not on file    Forced sexual activity: Not on file  Other Topics Concern  . Not on file  Social History Narrative  . Not on file   Past Surgical History:  Procedure Laterality Date  . A/V FISTULAGRAM Left 04/17/2018   Procedure: A/V FISTULAGRAM;  Surgeon: Algernon Huxley, MD;  Location: [facility];  Service: Cardiovascular;  Laterality: Left;  . AV FISTULA PLACEMENT Left 08/08/2017   Procedure: ARTERIOVENOUS (AV) FISTULA CREATION ( BRACHIOCEPHALIC );  Surgeon: Algernon Huxley, MD;  Location: [facility];  Service: Vascular;  Laterality: Left;  . CESAREAN SECTION     x 4 (1988, 1991, 1997, 2000 )  . COLONOSCOPY WITH PROPOFOL N/A 04/24/2018   Procedure: COLONOSCOPY WITH PROPOFOL;  Surgeon: Lin Landsman, MD;  Location: [facility];  Service: Gastroenterology;  Laterality: N/A;  . DIALYSIS/PERMA CATHETER INSERTION N/A 07/16/2017   Procedure: Dialysis/Perma Catheter Insertion;  Surgeon: Katha Cabal, MD;  Location: [facility];  Service: Cardiovascular;  Laterality: N/A;  . DIALYSIS/PERMA CATHETER REMOVAL N/A 10/29/2017   Procedure: DIALYSIS/PERMA CATHETER REMOVAL;  Surgeon: Katha Cabal, MD;  Location: [facility];  Service: Cardiovascular;  Laterality: N/A;  . excision of lymph nodes Bilateral 2014   bilateral under arms.   Family History  Problem Relation Age of Onset  . Diabetes Mother    No Known Allergies     Assessment & Plan:  Patient  presents sooner than her scheduled HDA follow-up.  The patient was seen in the emergency room over the weekend.  The patient notes being infiltrated at dialysis last week and now presents with a painful and swollen arm.  The patient was given doxycycline during her emergency room visit for a cellulitic appearance to the arm.  The patient notes dialysis staff is having a hard time with cannulation.  She also notes that her left hand hurts during dialysis.  The patient underwent a upper extremity left  HDA which was notable for an area of significant stenosis mid UA.  This area has a string sign as well as a dominant competing branch. Patient denies any ulcer formation to the left hand.  Denies any fistula skin breakdown.  Patient denies any fever, nausea vomiting.  1. Complication from renal dialysis device, sequela - Stable Patient infiltrated last week at dialysis which has caused some swelling and tenderness and ecchymosis to the fistula area. There is no vascular compromise to the left upper extremity on exam The patient's fistula is patent at this time and visibly pulsatile and do not feel that she is so infiltrated that she would need a PermCath.  However I do feel like the area of stenosis / competing branch should be corrected and there forth recommend a left upper extremity fistulogram with possible intervention. Tramadol 50 mg 1-2 tabs every 6 hours #60 given to patient for discomfort The patient knows that it is okay to still continue to dialyze Patient should continue the antibiotic doxycycline that she was given in the emergency room yesterday and finish its course for the cellulitis noted to her left upper extremity If the patient notes that her erythema is not improving on the antibiotic and another 2 to 3 days she should call our office If the patient expresses any fever, nausea or vomiting she should also call our office Procedure, risks and benefits explained to the patient.  All questions  answered.  The patient wishes to proceed.  2. ESRD on dialysis ([place]) - Stable As above  Current Outpatient Medications on File Prior to Visit  Medication Sig Dispense Refill  . albuterol (PROVENTIL HFA;VENTOLIN HFA) 108 (90 Base) MCG/ACT inhaler Inhale 2 puffs into the lungs every 6 (six) hours as needed for wheezing or shortness of breath. 18 g 3  . albuterol (PROVENTIL) (2.5 MG/3ML) 0.083% nebulizer solution Take 3 mLs (2.5 mg total) by nebulization 3 (three) times daily. (Patient taking differently: Take 2.5 mg by nebulization every 4 (four) hours as needed for shortness of breath. ) 75 mL 3  . amLODipine (NORVASC) 10 MG tablet Take 1 tablet (10 mg total) by mouth daily. 30 tablet 3  . doxycycline (VIBRAMYCIN) 100 MG capsule Take 1 capsule (100 mg total) by mouth 2 (two) times daily. 28 capsule 0  . DULERA 200-5 MCG/ACT AERO Inhale 2 puffs into the lungs 2 (two) times daily.  0  . DULoxetine (CYMBALTA) 60 MG capsule Take 60 mg by mouth 2 (two) times daily.   0  . furosemide (LASIX) 40 MG tablet take 1 tablet by mouth twice a day 60 tablet 2  . lidocaine-prilocaine (EMLA) cream Apply 1 application topically daily as needed [facility] access for dialysis).    . metoprolol tartrate (LOPRESSOR) 25 MG tablet Take 1 tablet (25 mg total) by mouth 2 (two) times daily. 60 tablet 3  . multivitamin (RENA-VIT) TABS tablet Take 1 tablet by mouth daily.  1  . oxyCODONE (ROXICODONE) 5 MG immediate release tablet Take 1 tablet (5 mg total) by mouth every 6 (six) hours as needed for breakthrough pain. 6 tablet 0  . pantoprazole (PROTONIX) 40 MG tablet Take 1 tablet (40 mg total) by mouth daily. 30 tablet 0  . SENSIPAR 30 MG tablet Take 30 mg by mouth See admin instructions. Mon Wed Fri daily  1  . tiotropium (SPIRIVA) 18 MCG inhalation capsule Place 1 capsule (18 mcg total) into inhaler and inhale 2 (two) times daily. (Patient taking differently: Place 62  mcg into inhaler and inhale daily as needed. ) 30 capsule 3    . Vitamin D, Ergocalciferol, (DRISDOL) 50000 units CAPS capsule Take 50,000 Units by mouth once a week. SUNDAYS  0   No current facility-administered medications on file prior to visit.    There are no Patient Instructions on file for this visit. No follow-ups on file.  Naftali Carchi A Maxfield Gildersleeve, PA-C

## 2018-05-20 ENCOUNTER — Other Ambulatory Visit (INDEPENDENT_AMBULATORY_CARE_PROVIDER_SITE_OTHER): Payer: Self-pay | Admitting: Vascular Surgery

## 2018-05-26 MED ORDER — CEFAZOLIN SODIUM-DEXTROSE 1-4 GM/50ML-% IV SOLN: 1.0000 g | Freq: Once | INTRAVENOUS | Status: DC

## 2018-05-27 ENCOUNTER — Encounter: Payer: Self-pay | Admitting: *Deleted

## 2018-05-27 ENCOUNTER — Encounter
Admission: RE | Disposition: A | Discharge status: Home / Self Care | Payer: Self-pay | Source: Ambulatory Visit | Attending: Vascular Surgery

## 2018-05-27 ENCOUNTER — Ambulatory Visit
Admission: RE | Admit: 2018-05-27 | Discharge: 2018-05-27 | Disposition: A | Discharge status: Home / Self Care | Payer: Medicaid Other | Source: Ambulatory Visit | Attending: Vascular Surgery | Admitting: Vascular Surgery

## 2018-05-27 DIAGNOSIS — F17210 Nicotine dependence, cigarettes, uncomplicated: Secondary | ICD-10-CM | POA: Diagnosis not present

## 2018-05-27 DIAGNOSIS — N179 Acute kidney failure, unspecified: Secondary | ICD-10-CM | POA: Diagnosis not present

## 2018-05-27 DIAGNOSIS — Z7951 Long term (current) use of inhaled steroids: Secondary | ICD-10-CM | POA: Diagnosis not present

## 2018-05-27 DIAGNOSIS — Z992 Dependence on renal dialysis: Secondary | ICD-10-CM | POA: Insufficient documentation

## 2018-05-27 DIAGNOSIS — K219 Gastro-esophageal reflux disease without esophagitis: Secondary | ICD-10-CM | POA: Insufficient documentation

## 2018-05-27 DIAGNOSIS — J449 Chronic obstructive pulmonary disease, unspecified: Secondary | ICD-10-CM | POA: Diagnosis not present

## 2018-05-27 DIAGNOSIS — T82858A Stenosis of vascular prosthetic devices, implants and grafts, initial encounter: Secondary | ICD-10-CM | POA: Insufficient documentation

## 2018-05-27 DIAGNOSIS — E559 Vitamin D deficiency, unspecified: Secondary | ICD-10-CM | POA: Insufficient documentation

## 2018-05-27 DIAGNOSIS — Z9889 Other specified postprocedural states: Secondary | ICD-10-CM | POA: Insufficient documentation

## 2018-05-27 DIAGNOSIS — I120 Hypertensive chronic kidney disease with stage 5 chronic kidney disease or end stage renal disease: Secondary | ICD-10-CM | POA: Insufficient documentation

## 2018-05-27 DIAGNOSIS — T82868A Thrombosis of vascular prosthetic devices, implants and grafts, initial encounter: Secondary | ICD-10-CM | POA: Diagnosis not present

## 2018-05-27 DIAGNOSIS — Z79899 Other long term (current) drug therapy: Secondary | ICD-10-CM | POA: Insufficient documentation

## 2018-05-27 DIAGNOSIS — N186 End stage renal disease: Secondary | ICD-10-CM

## 2018-05-27 DIAGNOSIS — Y832 Surgical operation with anastomosis, bypass or graft as the cause of abnormal reaction of the patient, or of later complication, without mention of misadventure at the time of the procedure: Secondary | ICD-10-CM | POA: Diagnosis not present

## 2018-05-27 HISTORY — PX: A/V FISTULAGRAM: CATH118298

## 2018-05-27 LAB — POTASSIUM (ARMC VASCULAR LAB ONLY): Potassium (ARMC vascular lab): 4.3 (ref 3.5–5.1)

## 2018-05-27 SURGERY — A/V FISTULAGRAM
Anesthesia: Moderate Sedation | Laterality: Left

## 2018-05-27 MED ORDER — CEFAZOLIN SODIUM-DEXTROSE 1-4 GM/50ML-% IV SOLN
INTRAVENOUS | Status: AC
  Administered 2018-05-27: 12:00:00
  Filled 2018-05-27: qty 50

## 2018-05-27 MED ORDER — HYDROMORPHONE HCL 1 MG/ML IJ SOLN
INTRAMUSCULAR | Status: AC
  Filled 2018-05-27: qty 0.5

## 2018-05-27 MED ORDER — MIDAZOLAM HCL 2 MG/2ML IJ SOLN
INTRAMUSCULAR | Status: DC | PRN
  Administered 2018-05-27: 1 mg via INTRAVENOUS
  Administered 2018-05-27: 2 mg via INTRAVENOUS
  Administered 2018-05-27 (×2): 1 mg via INTRAVENOUS

## 2018-05-27 MED ORDER — MIDAZOLAM HCL 2 MG/2ML IJ SOLN
INTRAMUSCULAR | Status: AC
  Filled 2018-05-27: qty 2

## 2018-05-27 MED ORDER — HEPARIN SODIUM (PORCINE) 1000 UNIT/ML IJ SOLN
INTRAMUSCULAR | Status: AC
  Filled 2018-05-27: qty 1

## 2018-05-27 MED ORDER — HEPARIN SODIUM (PORCINE) 1000 UNIT/ML IJ SOLN
INTRAMUSCULAR | Status: DC | PRN
  Administered 2018-05-27: 3000 [IU] via INTRAVENOUS

## 2018-05-27 MED ORDER — IOPAMIDOL (ISOVUE-300) INJECTION 61%
INTRAVENOUS | Status: DC | PRN
  Administered 2018-05-27: 35 mL via INTRAVENOUS

## 2018-05-27 MED ORDER — FAMOTIDINE 20 MG PO TABS: 40.0000 mg | ORAL_TABLET | ORAL | Status: DC | PRN

## 2018-05-27 MED ORDER — MIDAZOLAM HCL 5 MG/5ML IJ SOLN
INTRAMUSCULAR | Status: AC
  Filled 2018-05-27: qty 5

## 2018-05-27 MED ORDER — SODIUM CHLORIDE 0.9 % IV SOLN
INTRAVENOUS | Status: DC
  Administered 2018-05-27: 12:00:00 via INTRAVENOUS

## 2018-05-27 MED ORDER — FENTANYL CITRATE (PF) 100 MCG/2ML IJ SOLN
INTRAMUSCULAR | Status: AC
  Filled 2018-05-27: qty 2

## 2018-05-27 MED ORDER — HYDROMORPHONE HCL 1 MG/ML IJ SOLN
1.0000 mg | Freq: Once | INTRAMUSCULAR | Status: AC | PRN
  Administered 2018-05-27: 0.5 mg via INTRAVENOUS

## 2018-05-27 MED ORDER — HEPARIN (PORCINE) IN NACL 1000-0.9 UT/500ML-% IV SOLN
INTRAVENOUS | Status: AC
  Filled 2018-05-27: qty 1000

## 2018-05-27 MED ORDER — FENTANYL CITRATE (PF) 100 MCG/2ML IJ SOLN
INTRAMUSCULAR | Status: DC | PRN
  Administered 2018-05-27 (×2): 25 ug via INTRAVENOUS
  Administered 2018-05-27: 50 ug via INTRAVENOUS
  Administered 2018-05-27: 25 ug via INTRAVENOUS

## 2018-05-27 MED ORDER — LIDOCAINE HCL (PF) 1 % IJ SOLN
INTRAMUSCULAR | Status: AC
  Filled 2018-05-27: qty 30

## 2018-05-27 MED ORDER — ONDANSETRON HCL 4 MG/2ML IJ SOLN: 4.0000 mg | Freq: Four times a day (QID) | INTRAMUSCULAR | Status: DC | PRN

## 2018-05-27 MED ORDER — METHYLPREDNISOLONE SODIUM SUCC 125 MG IJ SOLR: 125.0000 mg | INTRAMUSCULAR | Status: DC | PRN

## 2018-05-27 SURGICAL SUPPLY — 21 items
BALLN DORADO 10X20X80 (BALLOONS) ×3
BALLN DORADO 8X60X80 (BALLOONS) ×3
BALLN LUTONIX AV 10X40X75 (BALLOONS) ×3
BALLOON DORADO 10X20X80 (BALLOONS) ×1 IMPLANT
BALLOON DORADO 8X60X80 (BALLOONS) ×1 IMPLANT
BALLOON LUTONIX AV 10X40X75 (BALLOONS) ×1 IMPLANT
CATH BEACON 5 .035 40 KMP TP (CATHETERS) ×1 IMPLANT
CATH BEACON 5 .038 40 KMP TP (CATHETERS) ×2
CATH MICROCATH PRGRT 2.8F 110 (CATHETERS) ×1 IMPLANT
COIL 400 COMPLEX STD 8X25CM (Vascular Products) ×3 IMPLANT
COIL 400 COMPLEX STD 9X30CM (Vascular Products) ×3 IMPLANT
DEVICE PRESTO INFLATION (MISCELLANEOUS) ×3 IMPLANT
GUIDEWIRE ANGLED .035 180CM (WIRE) ×3 IMPLANT
MICROCATH PROGREAT 2.8F 110 CM (CATHETERS) ×3
NEEDLE ENTRY 21GA 7CM ECHOTIP (NEEDLE) ×3 IMPLANT
PACK ANGIOGRAPHY (CUSTOM PROCEDURE TRAY) ×3 IMPLANT
SET INTRO CAPELLA COAXIAL (SET/KITS/TRAYS/PACK) ×3 IMPLANT
SHEATH BRITE TIP 6FRX5.5 (SHEATH) ×3 IMPLANT
SHEATH BRITE TIP 7FRX5.5 (SHEATH) ×6 IMPLANT
SUT MNCRL AB 4-0 PS2 18 (SUTURE) ×3 IMPLANT
WIRE MAGIC TOR.035 180C (WIRE) ×3 IMPLANT

## 2018-05-27 NOTE — Progress Notes (Signed)
Patient here today for fistulogram per Dr Delana Meyer. Attached to monitor. Will follow vitals throughout entire procedure.

## 2018-05-27 NOTE — H&P (Signed)
[facility] History & Physical Update  The patient was interviewed and re-examined.  The patient's previous History and Physical has been reviewed and is unchanged.  There is no change in the plan of care. We plan to proceed with the scheduled procedure.  Hortencia Pilar, MD  05/27/2018, 12:15 PM

## 2018-05-27 NOTE — Discharge Instructions (Signed)

## 2018-05-27 NOTE — Op Note (Signed)
OPERATIVE NOTE   PROCEDURE: 1. Contrast injection left brachiocephalic AV access 2. Percutaneous transluminal angioplasty to 10 mm mid left brachiocephalic fistula 3. Coil embolization of a large tributary from the left brachiocephalic fistula  PRE-OPERATIVE DIAGNOSIS: Complication of dialysis access                                                       End Stage Renal Disease  POST-OPERATIVE DIAGNOSIS: same as above   SURGEON: Katha Cabal, M.D.  ANESTHESIA: Conscious sedation was administered under my direct supervision by the interventional radiology RN. IV Versed plus fentanyl were utilized. Continuous ECG, pulse oximetry and blood pressure was monitored throughout the entire procedure.  Conscious sedation was for a total of 58 minutes.  ESTIMATED BLOOD LOSS: minimal  FINDING(S): Stricture of the AV graft  SPECIMEN(S):  None  CONTRAST: 35 cc  FLUOROSCOPY TIME: 4.6 minutes  INDICATIONS: Janet Olson is a 48 y.o. female who  presents with malfunctioning left brachiocephalic AV access.  The patient is scheduled for angiography with possible intervention of the AV access.  The patient is aware the risks include but are not limited to: bleeding, infection, thrombosis of the cannulated access, and possible anaphylactic reaction to the contrast.  The patient acknowledges if the access can not be salvaged a tunneled catheter will be needed and will be placed during this procedure.  The patient is aware of the risks of the procedure and elects to proceed with the angiogram and intervention.  DESCRIPTION: After full informed written consent was obtained, the patient was brought back to the Special Procedure suite and placed supine position.  Appropriate cardiopulmonary monitors were placed.  The left arm was prepped and draped in the standard fashion.  Appropriate timeout is called. The left brachiocephalic fistula was cannulated with a micropuncture needle.  Cannulation was  performed with ultrasound guidance. Ultrasound was placed in a sterile sleeve, the AV access was interrogated and noted to be echolucent and compressible indicating patency. Image was recorded for the permanent record. The puncture is performed under continuous ultrasound visualization.   The microwire was advanced and the needle was exchanged for  a microsheath.  The J-wire was then advanced and a 6 Fr sheath inserted.  Hand injections were completed to image the access from the arterial anastomosis through the entire access.  The central venous structures were also imaged by hand injections.  Arterial anastomosis widely patent.  In the midportion of the cephalic vein there is a greater than 80% stenosis.  There are several tibial teres that are filling but one in particular near the antecubital fossa is quite large and has significant retrograde flow of blood toward the forearm and then fills the deep system and returns.  This is creating relative venous hypertension.  The proximal cephalic vein, subclavian cephalic confluence and the central veins are widely patent.  Based on the images,  3000 units of heparin was given and a wire was negotiated through the strictures within the venous portion of the graft.  An 8 mm balloon was used.  Inflation was to 14 atm.  This.did not yield an adequate result and subsequently a 10 mm loop tonics drug-eluting balloon was used to dilate the stricture.  Inflation was for 51mnute to 12 atm.  This still yielded greater than 40% residual stenosis  and therefore a 10 mm x 2 cm [place] balloon was utilized inflation was to 28 atm for approximately 1 minute.  Follow-up imaging now demonstrated less than 10% residual stenosis with rapid filling of the fistula.  The large tributary was now addressed using a floppy Glidewire and a Kumpe catheter the tributary was selected hand-injection contrast through the Kumpe demonstrated the venous tributaries which were measured and a 8 mm x  30 cm Ruby coil was deployed a second 8 mm x 30 cm Ruby coil was deployed.  Follow-up imaging demonstrated a dramatic reduction in flow through the tributaries.  The lumen of the fistula proper was preserved.  Follow-up imaging demonstrates complete resolution of the stricture with rapid flow of contrast through the graft, the central venous anatomy is preserved.  A 4-0 Monocryl purse-string suture was sewn around the sheath.  The sheath was removed and light pressure was applied.  A sterile bandage was applied to the puncture site.    COMPLICATIONS: None  CONDITION: Janet Olson, M.D [facility] Office: [phone]  05/27/2018 1:09 PM

## 2018-05-27 NOTE — OR Nursing (Signed)
Dr shcnier oked giving pt 0.5 mg dilaudid for left upper arm pain post procedure

## 2018-05-30 ENCOUNTER — Encounter (INDEPENDENT_AMBULATORY_CARE_PROVIDER_SITE_OTHER): Payer: Self-pay | Admitting: Vascular Surgery

## 2018-05-30 ENCOUNTER — Ambulatory Visit (INDEPENDENT_AMBULATORY_CARE_PROVIDER_SITE_OTHER): Payer: Medicaid Other | Admitting: Vascular Surgery

## 2018-05-30 VITALS — BP 115/76 | HR 82 | Resp 16 | Ht 66.0 in | Wt 222.0 lb

## 2018-05-30 DIAGNOSIS — N186 End stage renal disease: Secondary | ICD-10-CM

## 2018-05-30 DIAGNOSIS — D631 Anemia in chronic kidney disease: Secondary | ICD-10-CM

## 2018-05-30 DIAGNOSIS — Z992 Dependence on renal dialysis: Secondary | ICD-10-CM | POA: Diagnosis not present

## 2018-05-30 DIAGNOSIS — T829XXS Unspecified complication of cardiac and vascular prosthetic device, implant and graft, sequela: Secondary | ICD-10-CM

## 2018-05-30 MED ORDER — CEPHALEXIN 500 MG PO CAPS: 500.0000 mg | ORAL_CAPSULE | Freq: Three times a day (TID) | ORAL | 0 refills | Status: DC

## 2018-05-30 NOTE — Progress Notes (Signed)
Subjective:    Patient ID: Janet Olson, female    DOB: 1970/12/01, 48 y.o.   MRN: [id number] Chief Complaint  Patient presents with  . Follow-up    eval access for left arm   Patient last seen in the office on May 19, 2018 for poorly functioning fistula.  The patient is status post a left upper extremity fistulogram with contrast injection left brachiocephalic AV access, percutaneous transluminal angioplasty to 10 mm mid left brachiocephalic fistula with coil embolization of a large tributary from the left brachiocephalic fistula on May 27, 2018.  The patient dialyzed without any issue the following Wednesday on May 28, 2018.  The patient noted some swelling and discomfort to the fistula the following day.  The patient notes that the overall swelling and erythema that was noted to her fistula when I initially saw her has improved.  Ulceration to the left upper extremity.  Patient denies any fever, nausea vomiting.  Review of Systems  Constitutional: Negative.   HENT: Negative.   Eyes: Negative.   Respiratory: Negative.   Cardiovascular: Negative.   Gastrointestinal: Negative.   Endocrine: Negative.   Genitourinary:       ESRD  Musculoskeletal: Negative.   Skin: Negative.   Allergic/Immunologic: Negative.   Neurological: Negative.   Hematological: Negative.   Psychiatric/Behavioral: Negative.       Objective:   Physical Exam  Constitutional: She is oriented to person, place, and time. She appears well-developed and well-nourished. No distress.  HENT:  Head: Normocephalic and atraumatic.  Right Ear: External ear normal.  Left Ear: External ear normal.  Eyes: Pupils are equal, round, and reactive to light. Conjunctivae and EOM are normal.  Neck: Normal range of motion.  Cardiovascular: Normal rate, regular rhythm, normal heart sounds and intact distal pulses.  Pulses:      Radial pulses are 2+ on the right side, and 2+ on the left side.  Left upper extremity dialysis  access: Good bruit and thrill.  Skin is intact.  There is an area of erythema tenderness and some induration to the lateral distal aspect of the fistula.  Pulmonary/Chest: Effort normal and breath sounds normal.  Musculoskeletal: Normal range of motion. She exhibits no edema.  Neurological: She is alert and oriented to person, place, and time.  Skin: Skin is warm and dry. She is not diaphoretic.  Psychiatric: She has a normal mood and affect. Her behavior is normal. Judgment and thought content normal.  Vitals reviewed.  BP 115/76 (BP Location: Right Arm)   Pulse 82   Resp 16   Ht 5\' 6"  (1.676 m)   Wt 222 lb (100.7 kg)   LMP 05/23/2018 (Exact Date) Comment: On period now, MD aware, Pt to sign waiver  BMI 35.83 kg/m   Past Medical History:  Diagnosis Date  . Acute renal failure (ARF) ([place]) 05/17/2017  . Anemia   . Asthma   . Cardiomegaly   . Chronic kidney disease   . COPD (chronic obstructive pulmonary disease) ([place])   . Dialysis patient ([place])    MON, WED , FRI  . Dyspnea   . Enlarged heart   . GERD (gastroesophageal reflux disease)   . Headache    Migraines  . Hypertension   . Nonrheumatic tricuspid (valve) insufficiency   . Ulcer   . Vitamin D deficiency    Social History   Socioeconomic History  . Marital status: Single    Spouse name: Not on file  . Number of children:  Not on file  . Years of education: Not on file  . Highest education level: Not on file  Occupational History  . Not on file  Social Needs  . Financial resource strain: Not on file  . Food insecurity:    Worry: Not on file    Inability: Not on file  . Transportation needs:    Medical: Not on file    Non-medical: Not on file  Tobacco Use  . Smoking status: Current Every Day Smoker    Packs/day: 0.50    Years: 30.00    Pack years: 15.00    Types: Cigarettes  . Smokeless tobacco: Never Used  Substance and Sexual Activity  . Alcohol use: No  . Drug use: No  . Sexual activity: Not on file    Lifestyle  . Physical activity:    Days per week: Not on file    Minutes per session: Not on file  . Stress: Not on file  Relationships  . Social connections:    Talks on phone: Not on file    Gets together: Not on file    Attends religious service: Not on file    Active member of club or organization: Not on file    Attends meetings of clubs or organizations: Not on file    Relationship status: Not on file  . Intimate partner violence:    Fear of current or ex partner: Not on file    Emotionally abused: Not on file    Physically abused: Not on file    Forced sexual activity: Not on file  Other Topics Concern  . Not on file  Social History Narrative  . Not on file   Past Surgical History:  Procedure Laterality Date  . A/V FISTULAGRAM Left 04/17/2018   Procedure: A/V FISTULAGRAM;  Surgeon: Algernon Huxley, MD;  Location: [facility];  Service: Cardiovascular;  Laterality: Left;  . A/V FISTULAGRAM Left 05/27/2018   Procedure: A/V FISTULAGRAM;  Surgeon: Katha Cabal, MD;  Location: [place] [facility];  Service: Cardiovascular;  Laterality: Left;  . AV FISTULA PLACEMENT Left 08/08/2017   Procedure: ARTERIOVENOUS (AV) FISTULA CREATION ( BRACHIOCEPHALIC );  Surgeon: Algernon Huxley, MD;  Location: [facility];  Service: Vascular;  Laterality: Left;  . CESAREAN SECTION     x 4 (1988, 1991, 1997, 2000 )  . COLONOSCOPY WITH PROPOFOL N/A 04/24/2018   Procedure: COLONOSCOPY WITH PROPOFOL;  Surgeon: Lin Landsman, MD;  Location: [facility];  Service: Gastroenterology;  Laterality: N/A;  . DIALYSIS/PERMA CATHETER INSERTION N/A 07/16/2017   Procedure: Dialysis/Perma Catheter Insertion;  Surgeon: Katha Cabal, MD;  Location: [place] [facility];  Service: Cardiovascular;  Laterality: N/A;  . DIALYSIS/PERMA CATHETER REMOVAL N/A 10/29/2017   Procedure: DIALYSIS/PERMA CATHETER REMOVAL;  Surgeon: Katha Cabal, MD;  Location: [place] [facility];  Service:  Cardiovascular;  Laterality: N/A;  . excision of lymph nodes Bilateral 2014   bilateral under arms.   Family History  Problem Relation Age of Onset  . Diabetes Mother    No Known Allergies     Assessment & Plan:  Patient last seen in the office on May 19, 2018 for poorly functioning fistula.  The patient is status post a left upper extremity fistulogram with contrast injection left brachiocephalic AV access, percutaneous transluminal angioplasty to 10 mm mid left brachiocephalic fistula with coil embolization of a large tributary from the left brachiocephalic fistula on May 27, 2018.  The patient dialyzed  without any issue the following Wednesday on May 28, 2018.  The patient noted some swelling and discomfort to the fistula the following day.  The patient notes that the overall swelling and erythema that was noted to her fistula when I initially saw her has improved.  Ulceration to the left upper extremity.  Patient denies any fever, nausea vomiting.  1. ESRD on dialysis ([place]) - Stable Patient status post a left upper extremity fistulogram with intervention Patient presents today with some cellulitis to the distal lateral aspect of the patient's fistula The patient is currently on doxycycline which was started in the emergency department prior to me initially seeing her.  I will switch her antibiotic to Keflex and I encouraged her to place warm compresses to the area. If the patient's symptoms worsen in the next 24 to 48 hours she needs to be seen at urgent care or the emergency department for further evaluation The patient expresses her understanding  2. Complication from renal dialysis device, sequela - Stable As above  3. Anemia in chronic kidney disease, on chronic dialysis ([place]) - Stable This is followed by the patients nephrologist and PCP  Current Outpatient Medications on File Prior to Visit  Medication Sig Dispense Refill  . albuterol (PROVENTIL HFA;VENTOLIN HFA) 108 (90 Base)  MCG/ACT inhaler Inhale 2 puffs into the lungs every 6 (six) hours as needed for wheezing or shortness of breath. 18 g 3  . albuterol (PROVENTIL) (2.5 MG/3ML) 0.083% nebulizer solution Take 3 mLs (2.5 mg total) by nebulization 3 (three) times daily. (Patient taking differently: Take 2.5 mg by nebulization every 4 (four) hours as needed for shortness of breath. ) 75 mL 3  . amLODipine (NORVASC) 10 MG tablet Take 1 tablet (10 mg total) by mouth daily. 30 tablet 3  . DULERA 200-5 MCG/ACT AERO Inhale 2 puffs into the lungs 2 (two) times daily.  0  . DULoxetine (CYMBALTA) 60 MG capsule Take 60 mg by mouth 2 (two) times daily.   0  . furosemide (LASIX) 40 MG tablet take 1 tablet by mouth twice a day 60 tablet 2  . lidocaine-prilocaine (EMLA) cream Apply 1 application topically daily as needed [facility] access for dialysis).    . metoprolol tartrate (LOPRESSOR) 25 MG tablet Take 1 tablet (25 mg total) by mouth 2 (two) times daily. 60 tablet 3  . multivitamin (RENA-VIT) TABS tablet Take 1 tablet by mouth daily.  1  . oxyCODONE (ROXICODONE) 5 MG immediate release tablet Take 1 tablet (5 mg total) by mouth every 6 (six) hours as needed for breakthrough pain. 6 tablet 0  . pantoprazole (PROTONIX) 40 MG tablet Take 1 tablet (40 mg total) by mouth daily. 30 tablet 0  . SENSIPAR 30 MG tablet Take 30 mg by mouth daily.   1  . tiotropium (SPIRIVA) 18 MCG inhalation capsule Place 1 capsule (18 mcg total) into inhaler and inhale 2 (two) times daily. (Patient taking differently: Place 18 mcg into inhaler and inhale daily as needed. ) 30 capsule 3  . traMADol (ULTRAM) 50 MG tablet One to Two Tabs By Mouth Every Six Hours As Needed For Pain (Patient taking differently: Take 50-100 mg by mouth every 6 (six) hours as needed for moderate pain. ) 60 tablet 0  . Vitamin D, Ergocalciferol, (DRISDOL) 50000 units CAPS capsule Take 50,000 Units by mouth every Sunday.   0   No current facility-administered medications on file prior to  visit.    There are no Patient Instructions  on file for this visit. No follow-ups on file.  Luvern Mcisaac A Shmuel Girgis, PA-C

## 2018-07-15 ENCOUNTER — Encounter (INDEPENDENT_AMBULATORY_CARE_PROVIDER_SITE_OTHER): Payer: Self-pay | Admitting: Vascular Surgery

## 2018-07-15 ENCOUNTER — Ambulatory Visit (INDEPENDENT_AMBULATORY_CARE_PROVIDER_SITE_OTHER): Payer: Medicaid Other | Admitting: Vascular Surgery

## 2018-07-15 ENCOUNTER — Other Ambulatory Visit (INDEPENDENT_AMBULATORY_CARE_PROVIDER_SITE_OTHER): Payer: Medicaid Other

## 2018-07-15 ENCOUNTER — Other Ambulatory Visit (INDEPENDENT_AMBULATORY_CARE_PROVIDER_SITE_OTHER): Payer: Self-pay | Admitting: Vascular Surgery

## 2018-07-15 ENCOUNTER — Encounter

## 2018-07-15 VITALS — BP 150/90 | HR 87 | Resp 17 | Ht 66.0 in | Wt 211.0 lb

## 2018-07-15 DIAGNOSIS — T829XXS Unspecified complication of cardiac and vascular prosthetic device, implant and graft, sequela: Secondary | ICD-10-CM | POA: Diagnosis not present

## 2018-07-15 DIAGNOSIS — Z9862 Peripheral vascular angioplasty status: Secondary | ICD-10-CM

## 2018-07-15 DIAGNOSIS — N186 End stage renal disease: Secondary | ICD-10-CM

## 2018-07-15 DIAGNOSIS — Z992 Dependence on renal dialysis: Secondary | ICD-10-CM

## 2018-07-15 DIAGNOSIS — D631 Anemia in chronic kidney disease: Secondary | ICD-10-CM

## 2018-07-15 NOTE — Progress Notes (Signed)
Subjective:    Patient ID: Janet Olson, female    DOB: November 16, 1970, 48 y.o.   MRN: [id number] Chief Complaint  Patient presents with  . Follow-up    [facility] 3week HDA   Patient presents for her first post procedure follow-up.  The patient is status post a left upper extremity fistulogram with branch coil embolization on May 19, 2018.  The patient notes that since her recent intervention the functioning of her dialysis access has improved. The patient underwent a duplex ultrasound of the AV access which was notable for a patent fistula without any significant hemodynamic stenosis. Hemodialysis doppler flow is 2634. The patient denies any issues with hemodialysis such as cannulation problems, increased bleeding, decrease in doppler flow or recirculation. The patient also denies any fistula skin breakdown, pain, edema, pallor or ulceration of the arm / hand.   Review of Systems  Constitutional: Negative.   HENT: Negative.   Eyes: Negative.   Respiratory: Negative.   Cardiovascular: Negative.   Gastrointestinal: Negative.   Endocrine: Negative.   Genitourinary:       ESRD  Musculoskeletal: Negative.   Skin: Negative.   Allergic/Immunologic: Negative.   Neurological: Negative.   Hematological: Negative.   Psychiatric/Behavioral: Negative.       Objective:   Physical Exam  Constitutional: She is oriented to person, place, and time. She appears well-developed and well-nourished. No distress.  HENT:  Head: Normocephalic and atraumatic.  Right Ear: External ear normal.  Left Ear: External ear normal.  Eyes: Pupils are equal, round, and reactive to light. Conjunctivae and EOM are normal.  Neck: Normal range of motion.  Cardiovascular: Normal rate, regular rhythm, normal heart sounds and intact distal pulses.  Pulses:      Radial pulses are 2+ on the right side, and 2+ on the left side.  Left upper extremity dialysis access: Good bruit and thrill.  Skin is intact.    Pulmonary/Chest: Effort normal and breath sounds normal.  Musculoskeletal: Normal range of motion. She exhibits no edema.  Neurological: She is alert and oriented to person, place, and time.  Skin: Skin is warm and dry. She is not diaphoretic.  Psychiatric: She has a normal mood and affect. Her behavior is normal. Judgment and thought content normal.  Vitals reviewed.  BP (!) 150/90 (BP Location: Right Arm)   Pulse 87   Resp 17   Ht 5\' 6"  (1.676 m)   Wt 211 lb (95.7 kg)   BMI 34.06 kg/m   Past Medical History:  Diagnosis Date  . Acute renal failure (ARF) ([place]) 05/17/2017  . Anemia   . Asthma   . Cardiomegaly   . Chronic kidney disease   . COPD (chronic obstructive pulmonary disease) ([place])   . Dialysis patient ([place])    MON, WED , FRI  . Dyspnea   . Enlarged heart   . GERD (gastroesophageal reflux disease)   . Headache    Migraines  . Hypertension   . Nonrheumatic tricuspid (valve) insufficiency   . Ulcer   . Vitamin D deficiency    Social History   Socioeconomic History  . Marital status: Single    Spouse name: Not on file  . Number of children: Not on file  . Years of education: Not on file  . Highest education level: Not on file  Occupational History  . Not on file  Social Needs  . Financial resource strain: Not on file  . Food insecurity:    Worry: Not on  file    Inability: Not on file  . Transportation needs:    Medical: Not on file    Non-medical: Not on file  Tobacco Use  . Smoking status: Current Every Day Smoker    Packs/day: 0.50    Years: 30.00    Pack years: 15.00    Types: Cigarettes  . Smokeless tobacco: Never Used  Substance and Sexual Activity  . Alcohol use: No  . Drug use: No  . Sexual activity: Not on file  Lifestyle  . Physical activity:    Days per week: Not on file    Minutes per session: Not on file  . Stress: Not on file  Relationships  . Social connections:    Talks on phone: Not on file    Gets together: Not on file     Attends religious service: Not on file    Active member of club or organization: Not on file    Attends meetings of clubs or organizations: Not on file    Relationship status: Not on file  . Intimate partner violence:    Fear of current or ex partner: Not on file    Emotionally abused: Not on file    Physically abused: Not on file    Forced sexual activity: Not on file  Other Topics Concern  . Not on file  Social History Narrative  . Not on file   Past Surgical History:  Procedure Laterality Date  . A/V FISTULAGRAM Left 04/17/2018   Procedure: A/V FISTULAGRAM;  Surgeon: Algernon Huxley, MD;  Location: [place] [facility];  Service: Cardiovascular;  Laterality: Left;  . A/V FISTULAGRAM Left 05/27/2018   Procedure: A/V FISTULAGRAM;  Surgeon: Katha Cabal, MD;  Location: [place] [facility];  Service: Cardiovascular;  Laterality: Left;  . AV FISTULA PLACEMENT Left 08/08/2017   Procedure: ARTERIOVENOUS (AV) FISTULA CREATION ( BRACHIOCEPHALIC );  Surgeon: Algernon Huxley, MD;  Location: [facility];  Service: Vascular;  Laterality: Left;  . CESAREAN SECTION     x 4 (1988, 1991, 1997, 2000 )  . COLONOSCOPY WITH PROPOFOL N/A 04/24/2018   Procedure: COLONOSCOPY WITH PROPOFOL;  Surgeon: Lin Landsman, MD;  Location: [facility];  Service: Gastroenterology;  Laterality: N/A;  . DIALYSIS/PERMA CATHETER INSERTION N/A 07/16/2017   Procedure: Dialysis/Perma Catheter Insertion;  Surgeon: Katha Cabal, MD;  Location: [place] [facility];  Service: Cardiovascular;  Laterality: N/A;  . DIALYSIS/PERMA CATHETER REMOVAL N/A 10/29/2017   Procedure: DIALYSIS/PERMA CATHETER REMOVAL;  Surgeon: Katha Cabal, MD;  Location: [place] [facility];  Service: Cardiovascular;  Laterality: N/A;  . excision of lymph nodes Bilateral 2014   bilateral under arms.   Family History  Problem Relation Age of Onset  . Diabetes Mother    No Known Allergies     Assessment & Plan:  Patient presents for  her first post procedure follow-up.  The patient is status post a left upper extremity fistulogram with branch coil embolization on May 19, 2018.  The patient notes that since her recent intervention the functioning of her dialysis access has improved. The patient underwent a duplex ultrasound of the AV access which was notable for a patent fistula without any significant hemodynamic stenosis. Hemodialysis doppler flow is 2634. The patient denies any issues with hemodialysis such as cannulation problems, increased bleeding, decrease in doppler flow or recirculation. The patient also denies any fistula skin breakdown, pain, edema, pallor or ulceration of the arm / hand.   1.  ESRD on dialysis ([place]) - Stable Studies reviewed with patient. The patient is doing well and currently has adequate dialysis access. Duplex ultrasound of the AV access shows a patent access with no evidence of hemodynamically significant strictures or stenosis.  The patient should continue to have duplex ultrasounds of the dialysis access every six months. The patient was instructed to call the office in the interim if any issues with dialysis access / doppler flow, pain, edema, pallor, fistula skin breakdown or ulceration of the arm / hand occur. The patient expressed their understanding.  - VAS US DUPLEX DIALYSIS ACCESS (AVF,AVG); Future  2. Complication from renal dialysis device, sequela - Stable As above  3. Anemia in chronic kidney disease, on chronic dialysis ([place]) - Stable The patient presents asymptomatically. This is followed by her nephrologist and primary care physician  Current Outpatient Medications on File Prior to Visit  Medication Sig Dispense Refill  . albuterol (PROVENTIL HFA;VENTOLIN HFA) 108 (90 Base) MCG/ACT inhaler Inhale 2 puffs into the lungs every 6 (six) hours as needed for wheezing or shortness of breath. 18 g 3  . albuterol (PROVENTIL) (2.5 MG/3ML) 0.083% nebulizer solution Take 3 mLs (2.5 mg  total) by nebulization 3 (three) times daily. (Patient taking differently: Take 2.5 mg by nebulization every 4 (four) hours as needed for shortness of breath. ) 75 mL 3  . amLODipine (NORVASC) 10 MG tablet Take 1 tablet (10 mg total) by mouth daily. 30 tablet 3  . cephALEXin (KEFLEX) 500 MG capsule Take 1 capsule (500 mg total) by mouth 3 (three) times daily. 30 capsule 0  . DULERA 200-5 MCG/ACT AERO Inhale 2 puffs into the lungs 2 (two) times daily.  0  . DULoxetine (CYMBALTA) 60 MG capsule Take 60 mg by mouth 2 (two) times daily.   0  . furosemide (LASIX) 40 MG tablet take 1 tablet by mouth twice a day 60 tablet 2  . lidocaine-prilocaine (EMLA) cream Apply 1 application topically daily as needed [facility] access for dialysis).    . metoprolol tartrate (LOPRESSOR) 25 MG tablet Take 1 tablet (25 mg total) by mouth 2 (two) times daily. 60 tablet 3  . multivitamin (RENA-VIT) TABS tablet Take 1 tablet by mouth daily.  1  . oxyCODONE (ROXICODONE) 5 MG immediate release tablet Take 1 tablet (5 mg total) by mouth every 6 (six) hours as needed for breakthrough pain. 6 tablet 0  . pantoprazole (PROTONIX) 40 MG tablet Take 1 tablet (40 mg total) by mouth daily. 30 tablet 0  . SENSIPAR 30 MG tablet Take 30 mg by mouth daily.   1  . tiotropium (SPIRIVA) 18 MCG inhalation capsule Place 1 capsule (18 mcg total) into inhaler and inhale 2 (two) times daily. (Patient taking differently: Place 18 mcg into inhaler and inhale daily as needed. ) 30 capsule 3  . traMADol (ULTRAM) 50 MG tablet One to Two Tabs By Mouth Every Six Hours As Needed For Pain (Patient taking differently: Take 50-100 mg by mouth every 6 (six) hours as needed for moderate pain. ) 60 tablet 0  . Vitamin D, Ergocalciferol, (DRISDOL) 50000 units CAPS capsule Take 50,000 Units by mouth every Sunday.   0   No current facility-administered medications on file prior to visit.     There are no Patient Instructions on file for this visit. No follow-ups on  file.   Kensie Susman A Gwynn Chalker, PA-C

## 2018-08-14 ENCOUNTER — Encounter (INDEPENDENT_AMBULATORY_CARE_PROVIDER_SITE_OTHER): Payer: Medicaid Other

## 2018-08-14 ENCOUNTER — Ambulatory Visit (INDEPENDENT_AMBULATORY_CARE_PROVIDER_SITE_OTHER): Payer: Medicaid Other | Admitting: Vascular Surgery

## 2018-09-25 ENCOUNTER — Other Ambulatory Visit: Payer: Self-pay

## 2018-09-25 ENCOUNTER — Emergency Department: Payer: Medicaid Other

## 2018-09-25 ENCOUNTER — Encounter: Payer: Self-pay | Admitting: *Deleted

## 2018-09-25 ENCOUNTER — Inpatient Hospital Stay
Admission: EM | Admit: 2018-09-25 | Discharge: 2018-09-27 | DRG: 291 | Disposition: A | Discharge status: Home / Self Care | Payer: Medicaid Other | Attending: Internal Medicine | Admitting: Internal Medicine

## 2018-09-25 DIAGNOSIS — Z833 Family history of diabetes mellitus: Secondary | ICD-10-CM | POA: Diagnosis not present

## 2018-09-25 DIAGNOSIS — J440 Chronic obstructive pulmonary disease with acute lower respiratory infection: Secondary | ICD-10-CM | POA: Diagnosis present

## 2018-09-25 DIAGNOSIS — J9601 Acute respiratory failure with hypoxia: Secondary | ICD-10-CM

## 2018-09-25 DIAGNOSIS — D631 Anemia in chronic kidney disease: Secondary | ICD-10-CM | POA: Diagnosis present

## 2018-09-25 DIAGNOSIS — Z992 Dependence on renal dialysis: Secondary | ICD-10-CM

## 2018-09-25 DIAGNOSIS — J9600 Acute respiratory failure, unspecified whether with hypoxia or hypercapnia: Secondary | ICD-10-CM | POA: Diagnosis present

## 2018-09-25 DIAGNOSIS — R0602 Shortness of breath: Secondary | ICD-10-CM | POA: Diagnosis present

## 2018-09-25 DIAGNOSIS — I132 Hypertensive heart and chronic kidney disease with heart failure and with stage 5 chronic kidney disease, or end stage renal disease: Secondary | ICD-10-CM | POA: Diagnosis present

## 2018-09-25 DIAGNOSIS — I361 Nonrheumatic tricuspid (valve) insufficiency: Secondary | ICD-10-CM | POA: Diagnosis present

## 2018-09-25 DIAGNOSIS — Z9114 Patient's other noncompliance with medication regimen: Secondary | ICD-10-CM

## 2018-09-25 DIAGNOSIS — J209 Acute bronchitis, unspecified: Secondary | ICD-10-CM | POA: Diagnosis present

## 2018-09-25 DIAGNOSIS — F17210 Nicotine dependence, cigarettes, uncomplicated: Secondary | ICD-10-CM | POA: Diagnosis present

## 2018-09-25 DIAGNOSIS — G43909 Migraine, unspecified, not intractable, without status migrainosus: Secondary | ICD-10-CM | POA: Diagnosis present

## 2018-09-25 DIAGNOSIS — K219 Gastro-esophageal reflux disease without esophagitis: Secondary | ICD-10-CM | POA: Diagnosis present

## 2018-09-25 DIAGNOSIS — E559 Vitamin D deficiency, unspecified: Secondary | ICD-10-CM | POA: Diagnosis present

## 2018-09-25 DIAGNOSIS — J441 Chronic obstructive pulmonary disease with (acute) exacerbation: Secondary | ICD-10-CM

## 2018-09-25 DIAGNOSIS — I509 Heart failure, unspecified: Secondary | ICD-10-CM | POA: Diagnosis present

## 2018-09-25 DIAGNOSIS — N186 End stage renal disease: Secondary | ICD-10-CM | POA: Diagnosis present

## 2018-09-25 DIAGNOSIS — I161 Hypertensive emergency: Secondary | ICD-10-CM | POA: Diagnosis present

## 2018-09-25 HISTORY — DX: Acute respiratory failure, unspecified whether with hypoxia or hypercapnia: J96.00

## 2018-09-25 LAB — BASIC METABOLIC PANEL
ANION GAP: 10 (ref 5–15)
BUN: 33 mg/dL — ABNORMAL HIGH (ref 6–20)
CALCIUM: 9.2 mg/dL (ref 8.9–10.3)
CO2: 29 mmol/L (ref 22–32)
Chloride: 102 mmol/L (ref 98–111)
Creatinine, Ser: 6.84 mg/dL — ABNORMAL HIGH (ref 0.44–1.00)
GFR calc Af Amer: 7 mL/min — ABNORMAL LOW (ref >60)
GFR, EST NON AFRICAN AMERICAN: 6 mL/min — AB (ref >60)
GLUCOSE: 100 mg/dL — AB (ref 70–99)
Potassium: 4 mmol/L (ref 3.5–5.1)
Sodium: 141 mmol/L (ref 135–145)

## 2018-09-25 LAB — CBC
HCT: 33.2 % — ABNORMAL LOW (ref 35.0–47.0)
HEMOGLOBIN: 11.1 g/dL — AB (ref 12.0–16.0)
MCH: 30.7 pg (ref 26.0–34.0)
MCHC: 33.4 g/dL (ref 32.0–36.0)
MCV: 92 fL (ref 80.0–100.0)
PLATELETS: 201 10*3/uL (ref 150–440)
RBC: 3.61 MIL/uL — ABNORMAL LOW (ref 3.80–5.20)
RDW: 16.1 % — AB (ref 11.5–14.5)
WBC: 3.6 10*3/uL (ref 3.6–11.0)

## 2018-09-25 LAB — TROPONIN I

## 2018-09-25 MED ORDER — IPRATROPIUM-ALBUTEROL 0.5-2.5 (3) MG/3ML IN SOLN
3.0000 mL | Freq: Once | RESPIRATORY_TRACT | Status: AC
  Administered 2018-09-25: 3 mL via RESPIRATORY_TRACT
  Filled 2018-09-25: qty 3

## 2018-09-25 MED ORDER — FUROSEMIDE 10 MG/ML IJ SOLN
80.0000 mg | Freq: Once | INTRAMUSCULAR | Status: AC
  Administered 2018-09-25: 80 mg via INTRAVENOUS
  Filled 2018-09-25: qty 8

## 2018-09-25 MED ORDER — METHYLPREDNISOLONE SODIUM SUCC 125 MG IJ SOLR
125.0000 mg | Freq: Once | INTRAMUSCULAR | Status: AC
  Administered 2018-09-25: 125 mg via INTRAVENOUS
  Filled 2018-09-25: qty 2

## 2018-09-25 MED ORDER — AMLODIPINE BESYLATE 5 MG PO TABS
10.0000 mg | ORAL_TABLET | Freq: Once | ORAL | Status: AC
  Administered 2018-09-25: 10 mg via ORAL
  Filled 2018-09-25: qty 2

## 2018-09-25 MED ORDER — LOSARTAN POTASSIUM 50 MG PO TABS
50.0000 mg | ORAL_TABLET | Freq: Once | ORAL | Status: AC
  Administered 2018-09-25: 50 mg via ORAL
  Filled 2018-09-25: qty 1

## 2018-09-25 MED ORDER — HYDROCOD POLST-CPM POLST ER 10-8 MG/5ML PO SUER
5.0000 mL | Freq: Once | ORAL | Status: AC
  Administered 2018-09-25: 5 mL via ORAL
  Filled 2018-09-25: qty 5

## 2018-09-25 NOTE — ED Provider Notes (Signed)
[facility] Emergency Department Provider Note  ____________________________________________  Time seen: Approximately 9:07 PM  I have reviewed the triage vital signs and the nursing notes.   HISTORY  Chief Complaint Shortness of Breath   HPI Janet Olson is a 48 y.o. female with a history of ESRD on HD (MWF), COPD, hypertension who presents for evaluation of shortness of breath.  Patient reports a month of progressively worsening dyspnea on exertion. Now patient reports she is barely able to walk in the house without getting significantly short of breath.  She reports that the shortness of breath usually resolves at rest.  No chest pain.  She has had a productive cough of clear sputum for the last month.  She denies swelling of her legs or history of CHF.  She denies orthopnea.  She went to urgent care today because the cough was not getting better and had a chest x-ray showing pulmonary edema which prompted her to be sent to the emergency room for evaluation.  Patient continues to smoke.  She has been using her inhalers at home with no significant relief.  No personal family history of blood clots, recent travel immobilization, leg pain or swelling, hemoptysis, or exogenous hormones.  Past Medical History:  Diagnosis Date  . Acute renal failure (ARF) ([place]) 05/17/2017  . Anemia   . Asthma   . Cardiomegaly   . Chronic kidney disease   . COPD (chronic obstructive pulmonary disease) ([place])   . Dialysis patient ([place])    MON, WED , FRI  . Dyspnea   . Enlarged heart   . GERD (gastroesophageal reflux disease)   . Headache    Migraines  . Hypertension   . Nonrheumatic tricuspid (valve) insufficiency   . Ulcer   . Vitamin D deficiency     Patient Active Problem List   Diagnosis Date Noted  . Renal dialysis device, implant, or graft complication 39/76/7341  . Colon cancer screening   . Respiratory failure with hypoxia ([place]) 04/21/2018  . ESRD on  dialysis ([place]) 08/02/2017  . COPD (chronic obstructive pulmonary disease) ([place]) 06/20/2017  . Anemia in chronic kidney disease 06/07/2017  . 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 Mass of left axilla 12/07/2013    Past Surgical History:  Procedure Laterality Date  . A/V FISTULAGRAM Left 04/17/2018   Procedure: A/V FISTULAGRAM;  Surgeon: Algernon Huxley, MD;  Location: [facility];  Service: Cardiovascular;  Laterality: Left;  . A/V FISTULAGRAM Left 05/27/2018   Procedure: A/V FISTULAGRAM;  Surgeon: Katha Cabal, MD;  Location: [facility];  Service: Cardiovascular;  Laterality: Left;  . AV FISTULA PLACEMENT Left 08/08/2017   Procedure: ARTERIOVENOUS (AV) FISTULA CREATION ( BRACHIOCEPHALIC );  Surgeon: Algernon Huxley, MD;  Location: [facility];  Service: Vascular;  Laterality: Left;  . CESAREAN SECTION     x 4 (1988, 1991, 1997, 2000 )  . COLONOSCOPY WITH PROPOFOL N/A 04/24/2018   Procedure: COLONOSCOPY WITH PROPOFOL;  Surgeon: Lin Landsman, MD;  Location: [facility];  Service: Gastroenterology;  Laterality: N/A;  . DIALYSIS/PERMA CATHETER INSERTION N/A 07/16/2017   Procedure: Dialysis/Perma Catheter Insertion;  Surgeon: Katha Cabal, MD;  Location: [facility];  Service: Cardiovascular;  Laterality: N/A;  . DIALYSIS/PERMA CATHETER REMOVAL N/A 10/29/2017   Procedure: DIALYSIS/PERMA CATHETER REMOVAL;  Surgeon: Katha Cabal, MD;  Location: [facility];  Service: Cardiovascular;  Laterality: N/A;  . excision of lymph nodes Bilateral 2014  bilateral under arms.    Prior to Admission medications   Medication Sig Start Date End Date Taking? Authorizing Provider  albuterol (PROVENTIL HFA;VENTOLIN HFA) 108 (90 Base) MCG/ACT inhaler Inhale 2 puffs into the lungs every 6 (six) hours as needed for wheezing or shortness of breath. 06/20/17   Johnson, Megan P, DO  albuterol (PROVENTIL) (2.5 MG/3ML) 0.083% nebulizer solution Take 3 mLs (2.5  mg total) by nebulization 3 (three) times daily. Patient taking differently: Take 2.5 mg by nebulization every 4 (four) hours as needed for shortness of breath.  06/20/17   Wynetta Emery, Megan P, DO  amLODipine (NORVASC) 10 MG tablet Take 1 tablet (10 mg total) by mouth daily. 06/12/17   Doles-Johnson, Teah, NP  cephALEXin (KEFLEX) 500 MG capsule Take 1 capsule (500 mg total) by mouth 3 (three) times daily. 05/30/18   Stegmayer, Kimberly A, PA-C  DULERA 200-5 MCG/ACT AERO Inhale 2 puffs into the lungs 2 (two) times daily. 01/16/18   [provider]  DULoxetine (CYMBALTA) 60 MG capsule Take 60 mg by mouth 2 (two) times daily.  12/05/17   [provider]  furosemide (LASIX) 40 MG tablet take 1 tablet by mouth twice a day 11/08/17   Park Liter P, DO  lidocaine-prilocaine (EMLA) cream Apply 1 application topically daily as needed [facility] access for dialysis).    [provider]  metoprolol tartrate (LOPRESSOR) 25 MG tablet Take 1 tablet (25 mg total) by mouth 2 (two) times daily. 06/12/17   Doles-Johnson, Teah, NP  multivitamin (RENA-VIT) TABS tablet Take 1 tablet by mouth daily. 08/23/17   [provider]  oxyCODONE (ROXICODONE) 5 MG immediate release tablet Take 1 tablet (5 mg total) by mouth every 6 (six) hours as needed for breakthrough pain. 05/18/18   Carrie Mew, MD  pantoprazole (PROTONIX) 40 MG tablet Take 1 tablet (40 mg total) by mouth daily. 02/22/18   Paulette Blanch, MD  SENSIPAR 30 MG tablet Take 30 mg by mouth daily.  04/07/18   [provider]  tiotropium (SPIRIVA) 18 MCG inhalation capsule Place 1 capsule (18 mcg total) into inhaler and inhale 2 (two) times daily. Patient taking differently: Place 18 mcg into inhaler and inhale daily as needed.  06/12/17   Doles-Johnson, Teah, NP  traMADol (ULTRAM) 50 MG tablet One to Two Tabs By Mouth Every Six Hours As Needed For Pain Patient taking differently: Take 50-100 mg by mouth every 6 (six) hours as needed for  moderate pain.  05/19/18   Stegmayer, Janalyn Harder, PA-C  Vitamin D, Ergocalciferol, (DRISDOL) 50000 units CAPS capsule Take 50,000 Units by mouth every Sunday.  10/22/17   [provider]    Allergies Patient has no known allergies.  Family History  Problem Relation Age of Onset  . Diabetes Mother     Social History Social History   Tobacco Use  . Smoking status: Current Every Day Smoker    Packs/day: 0.50    Years: 30.00    Pack years: 15.00    Types: Cigarettes  . Smokeless tobacco: Never Used  Substance Use Topics  . Alcohol use: No  . Drug use: No    Review of Systems  Constitutional: Negative for fever. Eyes: Negative for visual changes. ENT: Negative for sore throat. Neck: No neck pain  Cardiovascular: Negative for chest pain. Respiratory: + shortness of breath and cough Gastrointestinal: Negative for abdominal pain, vomiting or diarrhea. Genitourinary: Negative for dysuria. Musculoskeletal: Negative for back pain. Skin: Negative for rash. Neurological: Negative  for headaches, weakness or numbness. Psych: No SI or HI  ____________________________________________   PHYSICAL EXAM:  VITAL SIGNS: ED Triage Vitals  Enc Vitals Group     BP 09/25/18 1636 (!) 171/102     Pulse Rate 09/25/18 1636 94     Resp 09/25/18 1636 20     Temp 09/25/18 1636 98.2 F (36.8 C)     Temp Source 09/25/18 1636 Oral     SpO2 09/25/18 1636 100 %     Weight 09/25/18 1637 214 lb (97.1 kg)     Height 09/25/18 1637 5\' 6"  (1.676 m)     Head Circumference --      Peak Flow --      Pain Score 09/25/18 1637 8     Pain Loc --      Pain Edu? --      Excl. in [place]? --     Constitutional: Alert and oriented. Well appearing and in no apparent distress. HEENT:      Head: Normocephalic and atraumatic.         Eyes: Conjunctivae are normal. Sclera is non-icteric.       Mouth/Throat: Mucous membranes are moist.       Neck: Supple with no signs of meningismus. Cardiovascular:  Regular rate and rhythm. No murmurs, gallops, or rubs. 2+ symmetrical distal pulses are present in all extremities. No JVD. Respiratory: Increased work of breathing, hypoxic on room air, diffuse wheezing bilaterally with no crackles Gastrointestinal: Soft, non tender, and non distended with positive bowel sounds. No rebound or guarding. Musculoskeletal: Trace pitting edema bilaterally Neurologic: Normal speech and language. Face is symmetric. Moving all extremities. No gross focal neurologic deficits are appreciated. Skin: Skin is warm, dry and intact. No rash noted. Psychiatric: Mood and affect are normal. Speech and behavior are normal.  ____________________________________________   LABS (all labs ordered are listed, but only abnormal results are displayed)  Labs Reviewed  BASIC METABOLIC PANEL - Abnormal; Notable for the following components:      Result Value   Glucose, Bld 100 (*)    BUN 33 (*)    Creatinine, Ser 6.84 (*)    GFR calc non Af Amer 6 (*)    GFR calc Af Amer 7 (*)    All other components within normal limits  CBC - Abnormal; Notable for the following components:   RBC 3.61 (*)    Hemoglobin 11.1 (*)    HCT 33.2 (*)    RDW 16.1 (*)    All other components within normal limits  TROPONIN I   ____________________________________________  EKG  ED ECG REPORT I, Rudene Re, the attending physician, personally viewed and interpreted this ECG.  Normal sinus rhythm, rate of 96, prolonged QTC, low voltage QRS, normal axis, no ST elevations or depressions. No changes from prior ____________________________________________  RADIOLOGY  I have personally reviewed the images performed during this visit and I agree with the Radiologist's read.   Interpretation by Radiologist:  Dg Chest 2 View  Result Date: 09/25/2018 CLINICAL DATA:  Shortness of breath and cough a few days. EXAM: CHEST - 2 VIEW COMPARISON:  04/21/2018 FINDINGS: Lungs are adequately inflated  with mild prominence of the perihilar markings likely mild interstitial. No focal lobar consolidation or effusion. Stable moderate cardiomegaly. Remainder of the exam is unchanged. IMPRESSION: Stable moderate cardiomegaly with mild interstitial edema. Electronically Signed   By: Marin Olp M.D.   On: 09/25/2018 17:15     ____________________________________________   PROCEDURES  Procedure(s) performed: None Procedures Critical Care performed: yes  CRITICAL CARE Performed by: Rudene Re  ?  Total critical care time: 35 min  Critical care time was exclusive of separately billable procedures and treating other patients.  Critical care was necessary to treat or prevent imminent or life-threatening deterioration.  Critical care was time spent personally by me on the following activities: development of treatment plan with patient and/or surrogate as well as nursing, discussions with consultants, evaluation of patient's response to treatment, examination of patient, obtaining history from patient or surrogate, ordering and performing treatments and interventions, ordering and review of laboratory studies, ordering and review of radiographic studies, pulse oximetry and re-evaluation of patient's condition.  ____________________________________________   INITIAL IMPRESSION / ASSESSMENT AND PLAN / ED COURSE  48 y.o. female with a history of ESRD on HD (MWF), COPD, hypertension who presents for evaluation of progressively worsening shortness of breath x 1 month now with new O2 requirement. Hypoxic to 88% at rest on RA, wheezing bilaterally, trace pitting edema bilaterally and CXR showing pulmonary edema. Patient still makes urine and IV lasix given. EKG with no acute ischemic changes. Also h/o COPD, solumedrol and duonebs given. No infiltrate, fever or leukocytosis therefore abx held. BP elevated, patient has not taken home meds today, given now, will continue to monitor BP. Discussed  with Hospitalist for admission. If BP trends down and respiratory status improves patient can wait for HD in the am. Will continue to monitor for needs of emergent HD. Discussed with Dr. Candiss Norse who will evaluate patient in the am and is available for consult if patient's status deteriorates and she needs emergent dialysis.      As part of my medical decision making, I reviewed the following data within the [place] notes reviewed and incorporated, Labs reviewed , EKG interpreted , Old EKG reviewed, Old chart reviewed, Radiograph reviewed , Discussed with admitting physician , A consult was requested and obtained from this/these consultant(s) Nephrology, Notes from prior ED visits and [place] Controlled Substance Database    Pertinent labs & imaging results that were available during my care of the patient were reviewed by me and considered in my medical decision making (see chart for details).    ____________________________________________   FINAL CLINICAL IMPRESSION(S) / ED DIAGNOSES  Final diagnoses:  Acute respiratory failure with hypoxia (HCC)  Acute on chronic congestive heart failure, unspecified heart failure type ([place])  COPD exacerbation (HCC)      NEW MEDICATIONS STARTED DURING THIS VISIT:  ED Discharge Orders    None       Note:  This document was prepared using Dragon voice recognition software and may include unintentional dictation errors.    Alfred Levins, [place], MD 09/25/18 [phone]

## 2018-09-25 NOTE — ED Notes (Signed)
Provider at bedside at this time

## 2018-09-25 NOTE — ED Triage Notes (Signed)
Pt to triage via wheelchair.  Pt placed on 2 liters oxygen by first nurse.  Pt was seen at [facility] yesterday.  Pt reports sob and cough .  No chest pain.  Pt had dialysis yesterday.  No n/v/d.  cig smoker.  Pt alert  Speech clear.

## 2018-09-25 NOTE — H&P (Signed)
[place] [facility] [place] NAME: Janet Olson    MR#:  [id number]  DATE OF BIRTH:  1970/06/29  DATE OF ADMISSION:  09/25/2018  PRIMARY CARE PHYSICIAN: Remi Haggard, FNP   REQUESTING/REFERRING PHYSICIAN:   CHIEF COMPLAINT:   Chief Complaint  Patient presents with  . Shortness of Breath    HISTORY OF PRESENT ILLNESS: Janet Olson  is a 48 y.o. female with a known history of end-stage renal disease on hemodialysis, COPD, hypertension, tobacco abuse and other comorbidities. Patient presented to emergency room for acute onset of shortness of breath and productive cough going on for the past few days, gradually getting worse.  Actually patient states that her shortness of breath has been going on for the past month, gradually getting worse in the past few days to the point that she cannot walk more than 2 steps without being very short of breath.  She denies any chest pain, no fever or chills, no swelling in her lower extremities.  No similar episodes in the past. She initially presented to urgent care for shortness of breath and cough.  She was told that she had pulmonary edema per chest x-ray and she was sent to emergency room. Blood test done emergency room are notable for elevated creatinine level is 6.84. At the arrival to emergency room patient was noted with elevated blood pressure at 202/115.  Her oxygen saturation was 87% on room air. Chest x-ray shows interstitial edema. EKG shows normal sinus rhythm with heart rate 96 bpm, no acute ischemic changes. Patient is admitted for further evaluation and treatment.  PAST MEDICAL HISTORY:   Past Medical History:  Diagnosis Date  . Acute renal failure (ARF) ([place]) 05/17/2017  . Anemia   . Asthma   . Cardiomegaly   . Chronic kidney disease   . COPD (chronic obstructive pulmonary disease) ([place])   . Dialysis patient ([place])    MON, WED , FRI  . Dyspnea   . Enlarged heart   . GERD  (gastroesophageal reflux disease)   . Headache    Migraines  . Hypertension   . Nonrheumatic tricuspid (valve) insufficiency   . Ulcer   . Vitamin D deficiency     PAST SURGICAL HISTORY:  Past Surgical History:  Procedure Laterality Date  . A/V FISTULAGRAM Left 04/17/2018   Procedure: A/V FISTULAGRAM;  Surgeon: Algernon Huxley, MD;  Location: [place] [facility];  Service: Cardiovascular;  Laterality: Left;  . A/V FISTULAGRAM Left 05/27/2018   Procedure: A/V FISTULAGRAM;  Surgeon: Katha Cabal, MD;  Location: [place] [facility];  Service: Cardiovascular;  Laterality: Left;  . AV FISTULA PLACEMENT Left 08/08/2017   Procedure: ARTERIOVENOUS (AV) FISTULA CREATION ( BRACHIOCEPHALIC );  Surgeon: Algernon Huxley, MD;  Location: [facility];  Service: Vascular;  Laterality: Left;  . CESAREAN SECTION     x 4 (1988, 1991, 1997, 2000 )  . COLONOSCOPY WITH PROPOFOL N/A 04/24/2018   Procedure: COLONOSCOPY WITH PROPOFOL;  Surgeon: Lin Landsman, MD;  Location: [facility] END[facility];  Service: Gastroenterology;  Laterality: N/A;  . DIALYSIS/PERMA CATHETER INSERTION N/A 07/16/2017   Procedure: Dialysis/Perma Catheter Insertion;  Surgeon: Katha Cabal, MD;  Location: [place] [facility];  Service: Cardiovascular;  Laterality: N/A;  . DIALYSIS/PERMA CATHETER REMOVAL N/A 10/29/2017   Procedure: DIALYSIS/PERMA CATHETER REMOVAL;  Surgeon: Katha Cabal, MD;  Location: [place] [facility];  Service: Cardiovascular;  Laterality: N/A;  . excision of lymph nodes  Bilateral 2014   bilateral under arms.    SOCIAL HISTORY:  Social History   Tobacco Use  . Smoking status: Current Every Day Smoker    Packs/day: 0.50    Years: 30.00    Pack years: 15.00    Types: Cigarettes  . Smokeless tobacco: Never Used  Substance Use Topics  . Alcohol use: No    FAMILY HISTORY:  Family History  Problem Relation Age of Onset  . Diabetes Mother     DRUG ALLERGIES: No Known Allergies  REVIEW OF SYSTEMS:    CONSTITUTIONAL: No fever, but positive for fatigue and generalized weakness.  EYES: No changes in vision.  EARS, NOSE, AND THROAT: No tinnitus or ear pain.  RESPIRATORY: Positive for cough, shortness of breath, wheezing, no hemoptysis.  CARDIOVASCULAR: No chest pain, orthopnea, edema.  GASTROINTESTINAL: No nausea, vomiting, diarrhea or abdominal pain.  GENITOURINARY: No dysuria, hematuria.  ENDOCRINE: No polyuria, nocturia. HEMATOLOGY: No bleeding. SKIN: No rash or lesion. MUSCULOSKELETAL: No joint pain at this time.   NEUROLOGIC: No focal weakness.  PSYCHIATRY: No anxiety or depression.   MEDICATIONS AT HOME:  Prior to Admission medications   Medication Sig Start Date End Date Taking? Authorizing Provider  albuterol (PROVENTIL HFA;VENTOLIN HFA) 108 (90 Base) MCG/ACT inhaler Inhale 2 puffs into the lungs every 6 (six) hours as needed for wheezing or shortness of breath. 06/20/17   Johnson, Megan P, DO  albuterol (PROVENTIL) (2.5 MG/3ML) 0.083% nebulizer solution Take 3 mLs (2.5 mg total) by nebulization 3 (three) times daily. Patient taking differently: Take 2.5 mg by nebulization every 4 (four) hours as needed for shortness of breath.  06/20/17   Wynetta Emery, Megan P, DO  amLODipine (NORVASC) 10 MG tablet Take 1 tablet (10 mg total) by mouth daily. 06/12/17   Doles-Johnson, Teah, NP  cephALEXin (KEFLEX) 500 MG capsule Take 1 capsule (500 mg total) by mouth 3 (three) times daily. 05/30/18   Stegmayer, Kimberly A, PA-C  DULERA 200-5 MCG/ACT AERO Inhale 2 puffs into the lungs 2 (two) times daily. 01/16/18   [provider]  DULoxetine (CYMBALTA) 60 MG capsule Take 60 mg by mouth 2 (two) times daily.  12/05/17   [provider]  furosemide (LASIX) 40 MG tablet take 1 tablet by mouth twice a day 11/08/17   Park Liter P, DO  lidocaine-prilocaine (EMLA) cream Apply 1 application topically daily as needed [facility] access for dialysis).    [provider]  metoprolol tartrate  (LOPRESSOR) 25 MG tablet Take 1 tablet (25 mg total) by mouth 2 (two) times daily. 06/12/17   Doles-Johnson, Teah, NP  multivitamin (RENA-VIT) TABS tablet Take 1 tablet by mouth daily. 08/23/17   [provider]  oxyCODONE (ROXICODONE) 5 MG immediate release tablet Take 1 tablet (5 mg total) by mouth every 6 (six) hours as needed for breakthrough pain. 05/18/18   Carrie Mew, MD  pantoprazole (PROTONIX) 40 MG tablet Take 1 tablet (40 mg total) by mouth daily. 02/22/18   Paulette Blanch, MD  SENSIPAR 30 MG tablet Take 30 mg by mouth daily.  04/07/18   [provider]  tiotropium (SPIRIVA) 18 MCG inhalation capsule Place 1 capsule (18 mcg total) into inhaler and inhale 2 (two) times daily. Patient taking differently: Place 18 mcg into inhaler and inhale daily as needed.  06/12/17   Doles-Johnson, Teah, NP  traMADol (ULTRAM) 50 MG tablet One to Two Tabs By Mouth Every Six Hours As Needed For Pain Patient taking differently: Take 50-100 mg  by mouth every 6 (six) hours as needed for moderate pain.  05/19/18   Stegmayer, Janalyn Harder, PA-C  Vitamin D, Ergocalciferol, (DRISDOL) 50000 units CAPS capsule Take 50,000 Units by mouth every Sunday.  10/22/17   [provider]      PHYSICAL EXAMINATION:   VITAL SIGNS: Blood pressure (!) 202/115, pulse 94, temperature 98.2 F (36.8 C), temperature source Oral, resp. rate 20, height 5\' 6"  (1.676 m), weight 97.1 kg, last menstrual period 06/10/2018, SpO2 100 %.  GENERAL:  48 y.o.-year-old patient lying in the bed with mild respiratory distress.  EYES: Pupils equal, round, reactive to light and accommodation. No scleral icterus. Extraocular muscles intact.  HEENT: Head atraumatic, normocephalic. Oropharynx and nasopharynx clear.  NECK:  Supple, no jugular venous distention. No thyroid enlargement, no tenderness.  LUNGS: Reduced breath sounds and wheezing noted bilaterally.  Mild use of accessory muscles of respiration is noted.   CARDIOVASCULAR: S1, S2 normal. No S3/S4.  ABDOMEN: Soft, nontender, nondistended. Bowel sounds present. No organomegaly or mass.  EXTREMITIES: No pedal edema, cyanosis, or clubbing.  NEUROLOGIC: Cranial nerves II through XII are intact. Muscle strength 5/5 in all extremities. Sensation intact.   PSYCHIATRIC: The patient is alert and oriented x 3.  SKIN: No obvious rash, lesion, or ulcer.   LABORATORY PANEL:   CBC Recent Labs  Lab 09/25/18 1640  WBC 3.6  HGB 11.1*  HCT 33.2*  PLT 201  MCV 92.0  MCH 30.7  MCHC 33.4  RDW 16.1*   ------------------------------------------------------------------------------------------------------------------  Chemistries  Recent Labs  Lab 09/25/18 1640  NA 141  K 4.0  CL 102  CO2 29  GLUCOSE 100*  BUN 33*  CREATININE 6.84*  CALCIUM 9.2   ------------------------------------------------------------------------------------------------------------------ estimated creatinine clearance is 11.6 mL/min (A) (by C-G formula based on SCr of 6.84 mg/dL (H)). ------------------------------------------------------------------------------------------------------------------ No results for input(s): TSH, T4TOTAL, T3FREE, THYROIDAB in the last 72 hours.  Invalid input(s): FREET3   Coagulation profile No results for input(s): INR, PROTIME in the last 168 hours. ------------------------------------------------------------------------------------------------------------------- No results for input(s): DDIMER in the last 72 hours. -------------------------------------------------------------------------------------------------------------------  Cardiac Enzymes Recent Labs  Lab 09/25/18 1640  TROPONINI <0.03   ------------------------------------------------------------------------------------------------------------------ Invalid input(s):  POCBNP  ---------------------------------------------------------------------------------------------------------------  Urinalysis    Component Value Date/Time   COLORURINE AMBER (A) 02/21/2018 2232   APPEARANCEUR CLOUDY (A) 02/21/2018 2232   LABSPEC 1.029 02/21/2018 2232   PHURINE 6.0 02/21/2018 2232   GLUCOSEU 150 (A) 02/21/2018 2232   HGBUR NEGATIVE 02/21/2018 2232   BILIRUBINUR NEGATIVE 02/21/2018 2232   KETONESUR NEGATIVE 02/21/2018 2232   PROTEINUR 100 (A) 02/21/2018 2232   NITRITE NEGATIVE 02/21/2018 2232   LEUKOCYTESUR NEGATIVE 02/21/2018 2232     RADIOLOGY: Dg Chest 2 View  Result Date: 09/25/2018 CLINICAL DATA:  Shortness of breath and cough a few days. EXAM: CHEST - 2 VIEW COMPARISON:  04/21/2018 FINDINGS: Lungs are adequately inflated with mild prominence of the perihilar markings likely mild interstitial. No focal lobar consolidation or effusion. Stable moderate cardiomegaly. Remainder of the exam is unchanged. IMPRESSION: Stable moderate cardiomegaly with mild interstitial edema. Electronically Signed   By: Marin Olp M.D.   On: 09/25/2018 17:15    EKG: Orders placed or performed during the hospital encounter of 09/25/18  . ED EKG within 10 minutes  . ED EKG within 10 minutes    IMPRESSION AND PLAN:   1.  Acute respiratory failure with hypoxia, likely multifactorial, secondary to acute pulmonary edema, acute COPD exacerbation and acute bronchitis.  We will  start treatment with IV antibiotics, IV Lasix, nebulizer,  IV steroids and oxygen.  Continue to monitor patient clinically closely.  2.  Acute pulmonary edema, will start treatment with Lasix IV and oxygen.  Will check 2D echo to further eval the cardiac function.  3.  Acute COPD exacerbation likely secondary to acute bronchitis.  See treatment as above under #1.  4.  End-stage renal disease on hemodialysis, continue HD per nephrology.  5.  Hypertensive emergency.  It is possible that patient is  noncompliant with her blood pressure medications, although she denies it.  Will restart home medications and continue to monitor blood pressure closely.  We will add other BP meds as needed.  All the records are reviewed and case discussed with ED provider. Management plans discussed with the patient, family and they are in agreement.  CODE STATUS: Full Code Status History    Date Active Date Inactive Code Status Order ID Comments User Context   04/21/2018 0538 04/22/2018 1652 Full Code [id number]  Harrie Foreman, MD ED   05/17/2017 2042 05/19/2017 1402 Full Code [id number]  Vaughan Basta, MD Inpatient       TOTAL TIME TAKING CARE OF THIS PATIENT: 55 minutes.    Amelia Jo M.D on 09/25/2018 at 11:45 PM  Between 7am to 6pm - Pager - [phone]  After 6pm go to [email or web address] - password [email or web address] [facility] [place] [facility]  [phone]  CC: Primary care physician; Remi Haggard, FNP

## 2018-09-26 ENCOUNTER — Other Ambulatory Visit: Payer: Self-pay

## 2018-09-26 LAB — PHOSPHORUS: PHOSPHORUS: 2.5 mg/dL (ref 2.5–4.6)

## 2018-09-26 LAB — GLUCOSE, CAPILLARY: GLUCOSE-CAPILLARY: 133 mg/dL — AB (ref 70–99)

## 2018-09-26 MED ORDER — NICOTINE 21 MG/24HR TD PT24
21.0000 mg | MEDICATED_PATCH | Freq: Every day | TRANSDERMAL | Status: DC
  Administered 2018-09-26 – 2018-09-27 (×2): 21 mg via TRANSDERMAL
  Filled 2018-09-26 (×2): qty 1

## 2018-09-26 MED ORDER — CHLORHEXIDINE GLUCONATE CLOTH 2 % EX PADS: 6.0000 | MEDICATED_PAD | Freq: Every day | CUTANEOUS | Status: DC

## 2018-09-26 MED ORDER — VITAMIN D (ERGOCALCIFEROL) 1.25 MG (50000 UNIT) PO CAPS: 50000.0000 [IU] | ORAL_CAPSULE | ORAL | Status: DC

## 2018-09-26 MED ORDER — SODIUM CHLORIDE 0.9 % IV SOLN
500.0000 mg | INTRAVENOUS | Status: DC
  Administered 2018-09-26 – 2018-09-27 (×2): 500 mg via INTRAVENOUS
  Filled 2018-09-26 (×3): qty 500

## 2018-09-26 MED ORDER — FUROSEMIDE 10 MG/ML IJ SOLN
40.0000 mg | Freq: Two times a day (BID) | INTRAMUSCULAR | Status: DC
  Administered 2018-09-26 – 2018-09-27 (×2): 40 mg via INTRAVENOUS
  Filled 2018-09-26 (×4): qty 4

## 2018-09-26 MED ORDER — PANTOPRAZOLE SODIUM 40 MG PO TBEC
40.0000 mg | DELAYED_RELEASE_TABLET | Freq: Every day | ORAL | Status: DC
  Administered 2018-09-26: 40 mg via ORAL
  Filled 2018-09-26 (×2): qty 1

## 2018-09-26 MED ORDER — CINACALCET HCL 30 MG PO TABS
30.0000 mg | ORAL_TABLET | Freq: Every day | ORAL | Status: DC
  Filled 2018-09-26 (×2): qty 1

## 2018-09-26 MED ORDER — TIOTROPIUM BROMIDE MONOHYDRATE 18 MCG IN CAPS
18.0000 ug | ORAL_CAPSULE | Freq: Every day | RESPIRATORY_TRACT | Status: DC
  Administered 2018-09-26 – 2018-09-27 (×2): 18 ug via RESPIRATORY_TRACT
  Filled 2018-09-26: qty 5

## 2018-09-26 MED ORDER — HYDROCODONE-ACETAMINOPHEN 5-325 MG PO TABS
1.0000 | ORAL_TABLET | ORAL | Status: DC | PRN
  Administered 2018-09-26 – 2018-09-27 (×5): 2 via ORAL
  Filled 2018-09-26 (×5): qty 2

## 2018-09-26 MED ORDER — DOCUSATE SODIUM 100 MG PO CAPS
100.0000 mg | ORAL_CAPSULE | Freq: Two times a day (BID) | ORAL | Status: DC
  Administered 2018-09-26 (×3): 100 mg via ORAL
  Filled 2018-09-26 (×4): qty 1

## 2018-09-26 MED ORDER — BISACODYL 5 MG PO TBEC: 5.0000 mg | DELAYED_RELEASE_TABLET | Freq: Every day | ORAL | Status: DC | PRN

## 2018-09-26 MED ORDER — HEPARIN SODIUM (PORCINE) 5000 UNIT/ML IJ SOLN
5000.0000 [IU] | Freq: Three times a day (TID) | INTRAMUSCULAR | Status: DC
  Administered 2018-09-26 (×2): 5000 [IU] via SUBCUTANEOUS
  Filled 2018-09-26 (×2): qty 1

## 2018-09-26 MED ORDER — TRAMADOL HCL 50 MG PO TABS: 50.0000 mg | ORAL_TABLET | Freq: Two times a day (BID) | ORAL | Status: DC | PRN

## 2018-09-26 MED ORDER — RENA-VITE PO TABS
1.0000 | ORAL_TABLET | Freq: Every day | ORAL | Status: DC
  Administered 2018-09-26 – 2018-09-27 (×2): 1 via ORAL
  Filled 2018-09-26 (×2): qty 1

## 2018-09-26 MED ORDER — METHYLPREDNISOLONE SODIUM SUCC 125 MG IJ SOLR
80.0000 mg | Freq: Two times a day (BID) | INTRAMUSCULAR | Status: DC
  Administered 2018-09-26: 80 mg via INTRAVENOUS
  Filled 2018-09-26: qty 2

## 2018-09-26 MED ORDER — METHYLPREDNISOLONE SODIUM SUCC 125 MG IJ SOLR
60.0000 mg | Freq: Two times a day (BID) | INTRAMUSCULAR | Status: DC
  Administered 2018-09-27: 60 mg via INTRAVENOUS
  Filled 2018-09-26: qty 2

## 2018-09-26 MED ORDER — METOPROLOL TARTRATE 25 MG PO TABS
25.0000 mg | ORAL_TABLET | Freq: Two times a day (BID) | ORAL | Status: DC
  Administered 2018-09-26 – 2018-09-27 (×4): 25 mg via ORAL
  Filled 2018-09-26 (×4): qty 1

## 2018-09-26 MED ORDER — IPRATROPIUM-ALBUTEROL 0.5-2.5 (3) MG/3ML IN SOLN
3.0000 mL | Freq: Four times a day (QID) | RESPIRATORY_TRACT | Status: DC
  Administered 2018-09-26 (×2): 3 mL via RESPIRATORY_TRACT
  Filled 2018-09-26 (×4): qty 3

## 2018-09-26 MED ORDER — ACETAMINOPHEN 325 MG PO TABS: 650.0000 mg | ORAL_TABLET | Freq: Four times a day (QID) | ORAL | Status: DC | PRN

## 2018-09-26 MED ORDER — ONDANSETRON HCL 4 MG/2ML IJ SOLN: 4.0000 mg | Freq: Four times a day (QID) | INTRAMUSCULAR | Status: DC | PRN

## 2018-09-26 MED ORDER — AMLODIPINE BESYLATE 10 MG PO TABS
10.0000 mg | ORAL_TABLET | Freq: Every day | ORAL | Status: DC
  Administered 2018-09-26 – 2018-09-27 (×2): 10 mg via ORAL
  Filled 2018-09-26 (×2): qty 1

## 2018-09-26 MED ORDER — TRAZODONE HCL 50 MG PO TABS: 25.0000 mg | ORAL_TABLET | Freq: Every evening | ORAL | Status: DC | PRN

## 2018-09-26 MED ORDER — MOMETASONE FURO-FORMOTEROL FUM 200-5 MCG/ACT IN AERO
2.0000 | INHALATION_SPRAY | Freq: Two times a day (BID) | RESPIRATORY_TRACT | Status: DC
  Administered 2018-09-26 – 2018-09-27 (×3): 2 via RESPIRATORY_TRACT
  Filled 2018-09-26: qty 8.8

## 2018-09-26 MED ORDER — HEPARIN SODIUM (PORCINE) 1000 UNIT/ML DIALYSIS
20.0000 [IU]/kg | INTRAMUSCULAR | Status: DC | PRN
  Filled 2018-09-26: qty 2

## 2018-09-26 MED ORDER — DULOXETINE HCL 30 MG PO CPEP
30.0000 mg | ORAL_CAPSULE | Freq: Two times a day (BID) | ORAL | Status: DC
  Administered 2018-09-26 – 2018-09-27 (×4): 30 mg via ORAL
  Filled 2018-09-26 (×4): qty 1

## 2018-09-26 MED ORDER — ACETAMINOPHEN 650 MG RE SUPP: 650.0000 mg | Freq: Four times a day (QID) | RECTAL | Status: DC | PRN

## 2018-09-26 MED ORDER — MORPHINE SULFATE (PF) 2 MG/ML IV SOLN
2.0000 mg | Freq: Four times a day (QID) | INTRAVENOUS | Status: DC | PRN
  Administered 2018-09-26 – 2018-09-27 (×2): 2 mg via INTRAVENOUS
  Filled 2018-09-26 (×3): qty 1

## 2018-09-26 MED ORDER — SODIUM CHLORIDE 0.9 % IV SOLN
1.0000 g | INTRAVENOUS | Status: DC
  Administered 2018-09-26 – 2018-09-27 (×2): 1 g via INTRAVENOUS
  Filled 2018-09-26: qty 1
  Filled 2018-09-26 (×2): qty 10

## 2018-09-26 MED ORDER — ONDANSETRON HCL 4 MG PO TABS: 4.0000 mg | ORAL_TABLET | Freq: Four times a day (QID) | ORAL | Status: DC | PRN

## 2018-09-26 NOTE — ED Notes (Signed)
Patient transported to 225

## 2018-09-26 NOTE — Care Management (Signed)
Amanda Morris dialysis liaison notified of admission.    

## 2018-09-26 NOTE — Progress Notes (Signed)
[place], [place] 09/26/18  Subjective:   Patient known to our practice from outpatient dialysis.  She dialyzes at [place]. Patient reports that she is a current smoker.  She has this upper respiratory illness and cough.  [facility].  Chest x-ray was done.  She was suspected to have fluid overload and then referred to the emergency room for evaluation where she was found to be hypoxic.  She reports dyspnea on exertion.  Denies leg edema.  No fevers or chills.  Reports wheezing with cough.  She was admitted for further evaluation    HEMODIALYSIS FLOWSHEET:  Blood Flow Rate (mL/min): 400 mL/min Arterial Pressure (mmHg): -120 mmHg Venous Pressure (mmHg): 260 mmHg Transmembrane Pressure (mmHg): 50 mmHg Ultrafiltration Rate (mL/min): 720 mL/min Dialysate Flow Rate (mL/min): 500 ml/min Conductivity: Machine : 13.6 Conductivity: Machine : 13.6 Dialysis Fluid Bolus: Normal Saline Bolus Amount (mL): 250 mL    Objective:  Vital signs in last 24 hours:  Temp:  [98.2 F (36.8 C)-98.4 F (36.9 C)] 98.2 F (36.8 C) (09/27 1445) Pulse Rate:  [74-104] 98 (09/27 1513) Resp:  [13-36] 18 (09/27 1447) BP: (126-202)/(68-128) 142/81 (09/27 1513) SpO2:  [90 %-100 %] 94 % (09/27 1513) Weight:  [95.1 kg-98.9 kg] 96.5 kg (09/27 1445)  Weight change:  Filed Weights   09/26/18 0034 09/26/18 1038 09/26/18 1445  Weight: 95.1 kg 98.9 kg 96.5 kg    Intake/Output:    Intake/Output Summary (Last 24 hours) at 09/26/2018 1525 Last data filed at 09/26/2018 1445 Gross per 24 hour  Intake 562.93 ml  Output 2046 ml  Net -1483.07 ml     Physical Exam: General:  No acute distress, laying in the bed  HEENT  anicteric, moist oral mucous membranes  Neck  supple, no masses  Pulm/lungs  mild coarse breath sounds at bases, nasal cannula oxygen  CVS/Heart  regular rhythm, no rub  Abdomen:   Soft, nontender, nondistended  Extremities:  No  peripheral edema  Neurologic:  Alert, oriented  Skin:  No acute rashes          Basic Metabolic Panel:  Recent Labs  Lab 09/25/18 1640 09/26/18 1230  NA 141  --   K 4.0  --   CL 102  --   CO2 29  --   GLUCOSE 100*  --   BUN 33*  --   CREATININE 6.84*  --   CALCIUM 9.2  --   PHOS  --  2.5     CBC: Recent Labs  Lab 09/25/18 1640  WBC 3.6  HGB 11.1*  HCT 33.2*  MCV 92.0  PLT 201      Lab Results  Component Value Date   HEPBSAG Negative 07/11/2017      Microbiology:  No results found for this or any previous visit (from the past 240 hour(s)).  Coagulation Studies: No results for input(s): LABPROT, INR in the last 72 hours.  Urinalysis: No results for input(s): COLORURINE, LABSPEC, PHURINE, GLUCOSEU, HGBUR, BILIRUBINUR, KETONESUR, PROTEINUR, UROBILINOGEN, NITRITE, LEUKOCYTESUR in the last 72 hours.  Invalid input(s): APPERANCEUR    Imaging: Dg Chest 2 View  Result Date: 09/25/2018 CLINICAL DATA:  Shortness of breath and cough a few days. EXAM: CHEST - 2 VIEW COMPARISON:  04/21/2018 FINDINGS: Lungs are adequately inflated with mild prominence of the perihilar markings likely mild interstitial. No focal lobar consolidation or effusion. Stable moderate cardiomegaly. Remainder of the exam is unchanged. IMPRESSION: Stable moderate  cardiomegaly with mild interstitial edema. Electronically Signed   By: Marin Olp M.D.   On: 09/25/2018 17:15     Medications:   . azithromycin Stopped (09/26/18 0228)  . cefTRIAXone (ROCEPHIN)  IV Stopped (09/26/18 0157)   . amLODipine  10 mg Oral Daily  . Chlorhexidine Gluconate Cloth  6 each Topical Q0600  . cinacalcet  30 mg Oral Q breakfast  . docusate sodium  100 mg Oral BID  . DULoxetine  30 mg Oral BID  . furosemide  40 mg Intravenous BID  . heparin  5,000 Units Subcutaneous Q8H  . ipratropium-albuterol  3 mL Nebulization Q6H  . [START ON 09/27/2018] methylPREDNISolone (SOLU-MEDROL) injection  60 mg Intravenous  Q12H  . metoprolol tartrate  25 mg Oral BID  . mometasone-formoterol  2 puff Inhalation BID  . multivitamin  1 tablet Oral Daily  . nicotine  21 mg Transdermal Daily  . pantoprazole  40 mg Oral Daily  . tiotropium  18 mcg Inhalation Daily  . [START ON 09/28/2018] Vitamin D (Ergocalciferol)  50,000 Units Oral Q Sun   acetaminophen **OR** acetaminophen, bisacodyl, HYDROcodone-acetaminophen, morphine injection, ondansetron **OR** ondansetron (ZOFRAN) IV, traMADol, traZODone  Assessment/ Plan:  48 y.o. African-American female with end-stage renal disease, COPD, hypertension, current smoker presents for progressive shortness of breath, hypoxia, new oxygen requirement, wheezing, uncontrolled hypertension.    [facility] [facility]/MWF/97 kg / 240 minutes  1.  End-stage renal disease Patient seen during dialysis.  Tolerating well.  Ultrafiltration goal of up to 2 mg as tolerated.  Patient is below her dry weight as outpatient  2.  Acute pulmonary edema Cardiomegaly and interstitial edema noted on chest x-ray Ultrafiltration via hemodialysis as tolerated Consider repeating 2D echo.  According to echo from May 2018, LVEF was 55 to 60% Repeat CXR in AM  3.  Hypertension Patient reports that she was recently started on losartan Dose of antihypertensives in the evening so they are not held prior to hemodialysis     LOS: [place] 9/27/20193:25 PM  [place], [place]  Note: This note was prepared with Dragon dictation. Any transcription errors are unintentional

## 2018-09-26 NOTE — Progress Notes (Signed)
Pt refused 0200 hr tx, stated she had received tx x3 a few hours ago and did not need one at this time.

## 2018-09-26 NOTE — Progress Notes (Signed)
HD tx start    09/26/18 1050  Vital Signs  Pulse Rate 91  Pulse Rate Source Monitor  Resp 15  BP (!) 141/85  BP Location Right Arm  BP Method Automatic  Patient Position (if appropriate) Lying  Oxygen Therapy  SpO2 100 %  O2 Device Nasal Cannula  O2 Flow Rate (L/min) 3 L/min  During Hemodialysis Assessment  Blood Flow Rate (mL/min) 400 mL/min  Arterial Pressure (mmHg) -70 mmHg  Venous Pressure (mmHg) 170 mmHg  Transmembrane Pressure (mmHg) 70 mmHg  Ultrafiltration Rate (mL/min) 710 mL/min  Dialysate Flow Rate (mL/min) 800 ml/min  Conductivity: Machine  13.9  HD Safety Checks Performed Yes  Dialysis Fluid Bolus Normal Saline  Bolus Amount (mL) 250 mL  Intra-Hemodialysis Comments Tx initiated

## 2018-09-26 NOTE — Progress Notes (Signed)
Pre HD assessment    09/26/18 1038  Vital Signs  Temp 98.4 F (36.9 C)  Temp Source Oral  Pulse Rate 95  Pulse Rate Source Monitor  Resp 16  BP (!) 152/105  BP Location Right Arm  BP Method Automatic  Patient Position (if appropriate) Lying  Oxygen Therapy  SpO2 100 %  O2 Device Nasal Cannula  O2 Flow Rate (L/min) 3 L/min  Pain Assessment  Pain Scale 0-10  Pain Score 8  Pain Type Chronic pain  Pain Location  (pain all over)  Pain Intervention(s) RN made aware  Dialysis Weight  Weight 98.9 kg  Type of Weight Pre-Dialysis  Time-Out for Hemodialysis  What Procedure? HD  Pt Identifiers(min of two) First/Last Name;MRN/Account#  Correct Site? Yes  Correct Side? Yes  Correct Procedure? Yes  Consents Verified? Yes  Rad Studies Available? N/A  Safety Precautions Reviewed? Yes  Engineer, civil (consulting) Number  (2A)  Station Number 3  UF/Alarm Test Passed  Conductivity: Meter 14  Conductivity: Machine  14.2  pH 7.6  Reverse Osmosis main  Normal Saline Lot Number [id number]  Dialyzer Lot Number [id number]  Disposable Set Lot Number [id number]  Machine Temperature 98.6 F (37 C)  Musician and Audible Yes  Blood Lines Intact and Secured Yes  Pre Treatment Patient Checks  Vascular access used during treatment Fistula  Hepatitis B Surface Antigen Results Negative  Date Hepatitis B Surface Antigen Drawn 02/07/18  Hepatitis B Surface Antibody 576  Date Hepatitis B Surface Antibody Drawn 02/28/18  Hemodialysis Consent Verified Yes  Hemodialysis Standing Orders Initiated Yes  ECG (Telemetry) Monitor On Yes  Prime Ordered Normal Saline  Length of  DialysisTreatment -hour(s) 3.5 Hour(s)  Dialyzer Elisio 17H NR  Dialysate 3K, 2.5 Ca  Dialysis Anticoagulant None  Dialysate Flow Ordered 800  Blood Flow Rate Ordered 400 mL/min  Ultrafiltration Goal 1.5 Liters  Pre Treatment Labs Phosphorus  Dialysis Blood Pressure Support Ordered Normal Saline  Education / Care Plan   Dialysis Education Provided Yes  Documented Education in Care Plan Yes

## 2018-09-26 NOTE — Progress Notes (Signed)
HD tx end    09/26/18 1433  Vital Signs  Pulse Rate 93  Pulse Rate Source Monitor  Resp 15  BP (!) 141/71  BP Location Right Arm  BP Method Automatic  Patient Position (if appropriate) Lying  Oxygen Therapy  SpO2 90 %  O2 Device Nasal Cannula  O2 Flow Rate (L/min) 3 L/min  During Hemodialysis Assessment  Dialysis Fluid Bolus Normal Saline  Bolus Amount (mL) 250 mL  Intra-Hemodialysis Comments Tx completed

## 2018-09-26 NOTE — Progress Notes (Signed)
Pre HD assessment    09/26/18 1039  Neurological  Level of Consciousness Alert  Orientation Level Oriented X4  Respiratory  Respiratory Pattern Regular;Unlabored  Chest Assessment Chest expansion symmetrical  Cardiac  ECG Monitor Yes  Cardiac Rhythm NSR;ST  Vascular  R Radial Pulse +2  L Radial Pulse +2  Integumentary  Integumentary (WDL) X  Skin Color Appropriate for ethnicity  Musculoskeletal  Musculoskeletal (WDL) X  Generalized Weakness Yes  Assistive Device None  GU Assessment  Genitourinary (WDL) X  Genitourinary Symptoms  (HD)  Psychosocial  Psychosocial (WDL) WDL

## 2018-09-26 NOTE — Progress Notes (Signed)
[place] [facility] [place] NAME: Janet Olson    MR#:  [id number]  DATE OF BIRTH:  December 08, 1970  SUBJECTIVE:  CHIEF COMPLAINT:   Chief Complaint  Patient presents with  . Shortness of Breath  Patient seen and evaluated today On oxygen via nasal cannula Decreased shortness of breath No chest pain  REVIEW OF SYSTEMS:    ROS  CONSTITUTIONAL: No documented fever. No fatigue, weakness. No weight gain, no weight loss.  EYES: No blurry or double vision.  ENT: No tinnitus. No postnasal drip. No redness of the oropharynx.  RESPIRATORY: occasional cough, no wheeze, no hemoptysis. Decreased dyspnea.  CARDIOVASCULAR: No chest pain. No orthopnea. No palpitations. No syncope.  GASTROINTESTINAL: No nausea, no vomiting or diarrhea. No abdominal pain. No melena or hematochezia.  GENITOURINARY: No dysuria or hematuria.  ENDOCRINE: No polyuria or nocturia. No heat or cold intolerance.  HEMATOLOGY: No anemia. No bruising. No bleeding.  INTEGUMENTARY: No rashes. No lesions.  MUSCULOSKELETAL: No arthritis. No swelling. No gout.  NEUROLOGIC: No numbness, tingling, or ataxia. No seizure-type activity.  PSYCHIATRIC: No anxiety. No insomnia. No ADD.   DRUG ALLERGIES:  No Known Allergies  VITALS:  Blood pressure (!) 155/80, pulse 94, temperature 98.4 F (36.9 C), temperature source Oral, resp. rate (!) 36, height 5\' 6"  (1.676 m), weight 98.9 kg, last menstrual period 06/10/2018, SpO2 100 %.  PHYSICAL EXAMINATION:   Physical Exam  GENERAL:  48 y.o.-year-old patient lying in the bed with no acute distress.  EYES: Pupils equal, round, reactive to light and accommodation. No scleral icterus. Extraocular muscles intact.  HEENT: Head atraumatic, normocephalic. Oropharynx and nasopharynx clear.  NECK:  Supple, no jugular venous distention. No thyroid enlargement, no tenderness.  LUNGS: Improved breath sounds bilaterally, decreased bibasilar crepitations. No use of  accessory muscles of respiration.  CARDIOVASCULAR: S1, S2 normal. No murmurs, rubs, or gallops.  ABDOMEN: Soft, nontender, nondistended. Bowel sounds present. No organomegaly or mass.  EXTREMITIES: No cyanosis, clubbing or edema b/l.    NEUROLOGIC: Cranial nerves II through XII are intact. No focal Motor or sensory deficits b/l.   PSYCHIATRIC: The patient is alert and oriented x 3.  SKIN: No obvious rash, lesion, or ulcer.   LABORATORY PANEL:   CBC Recent Labs  Lab 09/25/18 1640  WBC 3.6  HGB 11.1*  HCT 33.2*  PLT 201   ------------------------------------------------------------------------------------------------------------------ Chemistries  Recent Labs  Lab 09/25/18 1640  NA 141  K 4.0  CL 102  CO2 29  GLUCOSE 100*  BUN 33*  CREATININE 6.84*  CALCIUM 9.2   ------------------------------------------------------------------------------------------------------------------  Cardiac Enzymes Recent Labs  Lab 09/25/18 1640  TROPONINI <0.03   ------------------------------------------------------------------------------------------------------------------  RADIOLOGY:  Dg Chest 2 View  Result Date: 09/25/2018 CLINICAL DATA:  Shortness of breath and cough a few days. EXAM: CHEST - 2 VIEW COMPARISON:  04/21/2018 FINDINGS: Lungs are adequately inflated with mild prominence of the perihilar markings likely mild interstitial. No focal lobar consolidation or effusion. Stable moderate cardiomegaly. Remainder of the exam is unchanged. IMPRESSION: Stable moderate cardiomegaly with mild interstitial edema. Electronically Signed   By: Marin Olp M.D.   On: 09/25/2018 17:15     ASSESSMENT AND PLAN:   48 year old female patient with history of end-stage renal disease on dialysis, hypertension, cardiomegaly, COPD, GERD, hypertension presented to the emergency room for shortness of breath  -Acute pulmonary edema Dialysis today to remove excess fluid Wean oxygen if  possible  -Acute respiratory failure with hypoxia secondary to  pulmonary edema and COPD exacerbation Dialysis to remove excess fluid  -Uncontrolled hypertension Optimize home medications and control blood pressure  -Acute on chronic bronchitis with COPD exacerbation Nebulization treatments IV Solu-Medrol Antibiotics  -Tobacco abuse Tobacco cessation counseled to the patient for 6 minutes Nicotine patch offered  All the records are reviewed and case discussed with Care Management/Social Worker. Management plans discussed with the patient, family and they are in agreement.  CODE STATUS: Full code  DVT Prophylaxis: SCDs  TOTAL TIME TAKING CARE OF THIS PATIENT: 34 minutes.   POSSIBLE D/C IN 1 DAYS, DEPENDING ON CLINICAL CONDITION.  [name] M.D on 09/26/2018 at 1:49 PM  Between 7am to 6pm - Pager - [phone]  After 6pm go to [email or web address] - password [email or web address]  Office  [phone]  CC: Primary care physician; Remi Haggard, FNP  Note: This dictation was prepared with Dragon dictation along with smaller phrase technology. Any transcriptional errors that result from this process are unintentional.

## 2018-09-26 NOTE — Progress Notes (Signed)
Post HD assessment. Pt tolerated tx well without c/o or complication. Net UF 2046, goal met.    09/26/18 1445  Vital Signs  Temp 98.2 F (36.8 C)  Temp Source Oral  Pulse Rate 90  Pulse Rate Source Monitor  Resp 18  BP (!) 131/99  BP Location Right Arm  BP Method Automatic  Patient Position (if appropriate) Lying  Oxygen Therapy  SpO2 99 %  O2 Device Nasal Cannula  O2 Flow Rate (L/min) 3 L/min  Dialysis Weight  Weight 96.5 kg  Type of Weight Post-Dialysis  Post-Hemodialysis Assessment  Rinseback Volume (mL) 250 mL  KECN 82.7 V  Dialyzer Clearance Lightly streaked  Duration of HD Treatment -hour(s) 3.5 hour(s)  Hemodialysis Intake (mL) 500 mL  UF Total -Machine (mL) 2546 mL  Net UF (mL) 2046 mL  Tolerated HD Treatment Yes  AVG/AVF Arterial Site Held (minutes) 10 minutes  AVG/AVF Venous Site Held (minutes) 10 minutes  Education / Care Plan  Dialysis Education Provided Yes  Documented Education in Care Plan Yes

## 2018-09-26 NOTE — Progress Notes (Signed)
Post HD assessment    09/26/18 1443  Neurological  Level of Consciousness Alert  Orientation Level Oriented X4  Respiratory  Respiratory Pattern Regular;Unlabored  Chest Assessment Chest expansion symmetrical  Cardiac  ECG Monitor Yes  Cardiac Rhythm NSR  Vascular  R Radial Pulse +2  L Radial Pulse +2  Edema Generalized  Integumentary  Integumentary (WDL) X  Skin Color Appropriate for ethnicity  Musculoskeletal  Musculoskeletal (WDL) X  Generalized Weakness Yes  Assistive Device None  GU Assessment  Genitourinary (WDL) X  Genitourinary Symptoms  (HD)  Psychosocial  Psychosocial (WDL) WDL

## 2018-09-27 ENCOUNTER — Inpatient Hospital Stay: Admit: 2018-09-27 | Payer: Medicaid Other

## 2018-09-27 ENCOUNTER — Inpatient Hospital Stay: Payer: Medicaid Other

## 2018-09-27 LAB — CBC
HEMATOCRIT: 32.7 % — AB (ref 35.0–47.0)
Hemoglobin: 11 g/dL — ABNORMAL LOW (ref 12.0–16.0)
MCH: 31 pg (ref 26.0–34.0)
MCHC: 33.7 g/dL (ref 32.0–36.0)
MCV: 92 fL (ref 80.0–100.0)
PLATELETS: 186 10*3/uL (ref 150–440)
RBC: 3.56 MIL/uL — AB (ref 3.80–5.20)
RDW: 16.1 % — AB (ref 11.5–14.5)
WBC: 4.9 10*3/uL (ref 3.6–11.0)

## 2018-09-27 LAB — BASIC METABOLIC PANEL
Anion gap: 9 (ref 5–15)
BUN: 35 mg/dL — ABNORMAL HIGH (ref 6–20)
CHLORIDE: 100 mmol/L (ref 98–111)
CO2: 32 mmol/L (ref 22–32)
CREATININE: 6.15 mg/dL — AB (ref 0.44–1.00)
Calcium: 9.1 mg/dL (ref 8.9–10.3)
GFR, EST AFRICAN AMERICAN: 8 mL/min — AB (ref >60)
GFR, EST NON AFRICAN AMERICAN: 7 mL/min — AB (ref >60)
Glucose, Bld: 136 mg/dL — ABNORMAL HIGH (ref 70–99)
POTASSIUM: 4.4 mmol/L (ref 3.5–5.1)
Sodium: 141 mmol/L (ref 135–145)

## 2018-09-27 LAB — GLUCOSE, CAPILLARY: Glucose-Capillary: 145 mg/dL — ABNORMAL HIGH (ref 70–99)

## 2018-09-27 LAB — HIV ANTIBODY (ROUTINE TESTING W REFLEX): HIV Screen 4th Generation wRfx: NONREACTIVE

## 2018-09-27 MED ORDER — AZITHROMYCIN 250 MG PO TABS: ORAL_TABLET | ORAL | 0 refills | Status: DC

## 2018-09-27 MED ORDER — BENZONATATE 100 MG PO CAPS
100.0000 mg | ORAL_CAPSULE | Freq: Three times a day (TID) | ORAL | Status: DC
  Administered 2018-09-27: 100 mg via ORAL
  Filled 2018-09-27: qty 1

## 2018-09-27 MED ORDER — AZITHROMYCIN 250 MG PO TABS: 250.0000 mg | ORAL_TABLET | Freq: Every day | ORAL | Status: DC

## 2018-09-27 MED ORDER — HYDROCOD POLST-CPM POLST ER 10-8 MG/5ML PO SUER: 5.0000 mL | Freq: Two times a day (BID) | ORAL | Status: DC | PRN

## 2018-09-27 MED ORDER — TORSEMIDE 100 MG PO TABS: 100.0000 mg | ORAL_TABLET | Freq: Every day | ORAL | 0 refills | Status: DC

## 2018-09-27 MED ORDER — HYDROCOD POLST-CPM POLST ER 10-8 MG/5ML PO SUER: 5.0000 mL | Freq: Two times a day (BID) | ORAL | 0 refills | Status: DC | PRN

## 2018-09-27 MED ORDER — PREDNISONE 20 MG PO TABS: 40.0000 mg | ORAL_TABLET | Freq: Every day | ORAL | Status: DC

## 2018-09-27 MED ORDER — PREDNISONE 20 MG PO TABS: ORAL_TABLET | ORAL | 0 refills | Status: DC

## 2018-09-27 MED ORDER — NICOTINE 21 MG/24HR TD PT24: MEDICATED_PATCH | TRANSDERMAL | 0 refills | Status: DC

## 2018-09-27 MED ORDER — LOSARTAN POTASSIUM 25 MG PO TABS: 25.0000 mg | ORAL_TABLET | Freq: Every day | ORAL | 0 refills | Status: DC

## 2018-09-27 MED ORDER — CALCIUM ACETATE 667 MG PO CAPS: 1334.0000 mg | ORAL_CAPSULE | Freq: Three times a day (TID) | ORAL | 0 refills | Status: DC

## 2018-09-27 MED ORDER — BENZONATATE 100 MG PO CAPS: 100.0000 mg | ORAL_CAPSULE | Freq: Three times a day (TID) | ORAL | 0 refills | Status: DC

## 2018-09-27 MED ORDER — OXYCODONE HCL 5 MG PO TABS: 5.0000 mg | ORAL_TABLET | Freq: Four times a day (QID) | ORAL | 0 refills | Status: DC | PRN

## 2018-09-27 NOTE — Discharge Summary (Signed)
[facility] [place] NAME: Janet Olson    MR#:  [id number]  DATE OF BIRTH:  Dec 18, 1970  DATE OF ADMISSION:  09/25/2018 ADMITTING PHYSICIAN: Amelia Jo, MD  DATE OF DISCHARGE: 09/27/2018 11:22 AM  PRIMARY CARE PHYSICIAN: Remi Haggard, FNP    ADMISSION DIAGNOSIS:  COPD exacerbation ([place]) [J44.1] Acute respiratory failure with hypoxia ([facility]) [J96.01] Acute on chronic congestive heart failure, unspecified heart failure type ([place]) [I50.9]  DISCHARGE DIAGNOSIS:  Active Problems:   Acute respiratory failure ([place])   SECONDARY DIAGNOSIS:   Past Medical History:  Diagnosis Date  . Acute renal failure (ARF) ([place]) 05/17/2017  . Anemia   . Asthma   . Cardiomegaly   . Chronic kidney disease   . COPD (chronic obstructive pulmonary disease) ([place])   . Dialysis patient ([place])    MON, WED , FRI  . Dyspnea   . Enlarged heart   . GERD (gastroesophageal reflux disease)   . Headache    Migraines  . Hypertension   . Nonrheumatic tricuspid (valve) insufficiency   . Ulcer   . Vitamin D deficiency     HOSPITAL COURSE:   1.  Acute hypoxic respiratory failure.  Patient was tapered off oxygen during the hospital course.  Patient initially required oxygen. 2.  Acute fluid overload.  The patient was dialyzed yesterday with 2 L removed.  Previous echocardiogram showed a normal ejection fraction.  Case discussed with Dr. Candiss Norse and the patient's Lasix was switched over to torsemide 100 mg daily upon going home.  The patient was given diuresis with IV Lasix while here.  Patient on metoprolol and losartan. 3.  Uncontrolled hypertension.  Blood pressure trending better.  On Norvasc metoprolol and losartan. 4.  COPD exacerbation.  The patient was given Solu-Medrol IV and nebulizer treatments.  The patient does have a slight expiratory wheeze when she is breathing through her mouth but this disappears when she is breathing through her nose.  Continue to wear  her inhalers.  Prescribe Zithromax upon going home. 5.  Tobacco abuse.  Patient must stop smoking.  Nicotine patch ordered  DISCHARGE CONDITIONS:   Satisfactory  CONSULTS OBTAINED:  Treatment Team:  Murlean Iba, MD  DRUG ALLERGIES:  No Known Allergies  DISCHARGE MEDICATIONS:   Allergies as of 09/27/2018   No Known Allergies     Medication List    STOP taking these medications   cephALEXin 500 MG capsule Commonly known as:  KEFLEX   furosemide 40 MG tablet Commonly known as:  LASIX   traMADol 50 MG tablet Commonly known as:  ULTRAM     TAKE these medications   albuterol 108 (90 Base) MCG/ACT inhaler Commonly known as:  PROVENTIL HFA;VENTOLIN HFA Inhale 2 puffs into the lungs every 6 (six) hours as needed for wheezing or shortness of breath. What changed:  Another medication with the same name was changed. Make sure you understand how and when to take each.   albuterol (2.5 MG/3ML) 0.083% nebulizer solution Commonly known as:  PROVENTIL Take 3 mLs (2.5 mg total) by nebulization 3 (three) times daily. What changed:    when to take this  reasons to take this   amLODipine 10 MG tablet Commonly known as:  NORVASC Take 1 tablet (10 mg total) by mouth daily.   azithromycin 250 MG tablet Commonly known as:  ZITHROMAX One tab daily for three days Start taking on:  09/28/2018   benzonatate 100 MG capsule Commonly known as:  TESSALON Take 1 capsule (100 mg total) by mouth 3 (three) times daily.   calcium acetate 667 MG capsule Commonly known as:  PHOSLO Take 2 capsules (1,334 mg total) by mouth 3 (three) times daily with meals.   chlorpheniramine-HYDROcodone 10-8 MG/5ML Suer Commonly known as:  TUSSIONEX Take 5 mLs by mouth every 12 (twelve) hours as needed for cough.   DULERA 200-5 MCG/ACT Aero Generic drug:  mometasone-formoterol Inhale 2 puffs into the lungs 2 (two) times daily.   DULoxetine 60 MG capsule Commonly known as:  CYMBALTA Take 60 mg by  mouth 2 (two) times daily.   lidocaine-prilocaine cream Commonly known as:  EMLA Apply 1 application topically daily as needed [facility] access for dialysis).   losartan 25 MG tablet Commonly known as:  COZAAR Take 1 tablet (25 mg total) by mouth daily.   metoprolol tartrate 25 MG tablet Commonly known as:  LOPRESSOR Take 1 tablet (25 mg total) by mouth 2 (two) times daily.   multivitamin Tabs tablet Take 1 tablet by mouth daily.   nicotine 21 mg/24hr patch Commonly known as:  NICODERM CQ - dosed in mg/24 hours One patch to skin daily (okay to substitute generic)   oxyCODONE 5 MG immediate release tablet Commonly known as:  Oxy IR/ROXICODONE Take 1 tablet (5 mg total) by mouth every 6 (six) hours as needed for moderate pain or severe pain. What changed:  reasons to take this   pantoprazole 40 MG tablet Commonly known as:  PROTONIX Take 1 tablet (40 mg total) by mouth daily.   predniSONE 20 MG tablet Commonly known as:  DELTASONE two tabs po daily for three days then stop   SENSIPAR 30 MG tablet Generic drug:  cinacalcet Take 30 mg by mouth daily.   tiotropium 18 MCG inhalation capsule Commonly known as:  SPIRIVA Place 1 capsule (18 mcg total) into inhaler and inhale 2 (two) times daily. What changed:    when to take this  reasons to take this   torsemide 100 MG tablet Commonly known as:  DEMADEX Take 1 tablet (100 mg total) by mouth daily.   Vitamin D (Ergocalciferol) 50000 units Caps capsule Commonly known as:  DRISDOL Take 50,000 Units by mouth every Sunday.        DISCHARGE INSTRUCTIONS:   Follow-up PMD 5 days Follow-up dialysis as scheduled  If you experience worsening of your admission symptoms, develop shortness of breath, life threatening emergency, suicidal or homicidal thoughts you must seek medical attention immediately by calling 911 or calling your MD immediately  if symptoms less severe.  You Must read complete instructions/literature along  with all the possible adverse reactions/side effects for all the Medicines you take and that have been prescribed to you. Take any new Medicines after you have completely understood and accept all the possible adverse reactions/side effects.   Please note  You were cared for by a hospitalist during your hospital stay. If you have any questions about your discharge medications or the care you received while you were in the hospital after you are discharged, you can call the unit and asked to speak with the hospitalist on call if the hospitalist that took care of you is not available. Once you are discharged, your primary care physician will handle any further medical issues. Please note that NO REFILLS for any discharge medications will be authorized once you are discharged, as it is imperative that you return to your primary care physician (or establish a relationship with a primary  care physician if you do not have one) for your aftercare needs so that they can reassess your need for medications and monitor your lab values.    Today   CHIEF COMPLAINT:   Chief Complaint  Patient presents with  . Shortness of Breath    HISTORY OF PRESENT ILLNESS:  Janet Olson  is a 48 y.o. female presented with shortness of breath   VITAL SIGNS:  Blood pressure (!) 145/90, pulse 94, temperature 98.2 F (36.8 C), temperature source Oral, resp. rate 18, height 5\' 6"  (1.676 m), weight 96.3 kg, last menstrual period 06/10/2018, SpO2 91 %.    PHYSICAL EXAMINATION:  GENERAL:  48 y.o.-year-old patient lying in the bed with no acute distress.  EYES: Pupils equal, round, reactive to light and accommodation. No scleral icterus. Extraocular muscles intact.  HEENT: Head atraumatic, normocephalic. Oropharynx and nasopharynx clear.  NECK:  Supple, no jugular venous distention. No thyroid enlargement, no tenderness.  LUNGS: Decreased breath sounds bilaterally, slight expiratory wheezing when breathing through her  mouth but this disappears when she breathes through her nose.  No rales,rhonchi or crepitation. No use of accessory muscles of respiration.  CARDIOVASCULAR: S1, S2 normal. No murmurs, rubs, or gallops.  ABDOMEN: Soft, non-tender, non-distended. Bowel sounds present. No organomegaly or mass.  EXTREMITIES: Trace pedal edema, no cyanosis, or clubbing.  NEUROLOGIC: Cranial nerves II through XII are intact. Muscle strength 5/5 in all extremities. Sensation intact. Gait not checked.  PSYCHIATRIC: The patient is alert and oriented x 3.  SKIN: No obvious rash, lesion, or ulcer.   DATA REVIEW:   CBC Recent Labs  Lab 09/27/18 0634  WBC 4.9  HGB 11.0*  HCT 32.7*  PLT 186    Chemistries  Recent Labs  Lab 09/27/18 0634  NA 141  K 4.4  CL 100  CO2 32  GLUCOSE 136*  BUN 35*  CREATININE 6.15*  CALCIUM 9.1    Cardiac Enzymes Recent Labs  Lab 09/25/18 1640  TROPONINI <0.03    Microbiology Results  Results for orders placed or performed during the hospital encounter of 04/21/18  MRSA PCR Screening     Status: None   Collection Time: 04/21/18  7:00 AM  Result Value Ref Range Status   MRSA by PCR NEGATIVE NEGATIVE Final    Comment:        The GeneXpert MRSA Assay (FDA approved for NASAL specimens only), is one component of a comprehensive MRSA colonization surveillance program. It is not intended to diagnose MRSA infection nor to guide or monitor treatment for MRSA infections. Performed at [facility], [address] [place], [place] [address]     RADIOLOGY:  Dg Chest 2 View  Result Date: 09/27/2018 CLINICAL DATA:  Short of breath EXAM: CHEST - 2 VIEW COMPARISON:  09/25/2018 FINDINGS: Severe cardiomegaly. Vascular congestion without interstitial edema. No pneumothorax or pleural effusion. IMPRESSION: Stable vascular congestion without pulmonary edema. Electronically Signed   By: Marybelle Killings M.D.   On: 09/27/2018 09:27   Dg Chest 2 View  Result Date:  09/25/2018 CLINICAL DATA:  Shortness of breath and cough a few days. EXAM: CHEST - 2 VIEW COMPARISON:  04/21/2018 FINDINGS: Lungs are adequately inflated with mild prominence of the perihilar markings likely mild interstitial. No focal lobar consolidation or effusion. Stable moderate cardiomegaly. Remainder of the exam is unchanged. IMPRESSION: Stable moderate cardiomegaly with mild interstitial edema. Electronically Signed   By: Marin Olp M.D.   On: 09/25/2018 17:15  Management plans discussed with the patient, and she is in agreement.  CODE STATUS:  Code Status History    Date Active Date Inactive Code Status Order ID Comments User Context   09/26/2018 0033 09/27/2018 1427 Full Code [id number]  Amelia Jo, MD Inpatient   04/21/2018 0538 04/22/2018 1652 Full Code [id number]  Harrie Foreman, MD ED   05/17/2017 2042 05/19/2017 1402 Full Code [id number]  Vaughan Basta, MD Inpatient      TOTAL TIME TAKING CARE OF THIS PATIENT: 35 minutes.    Loletha Grayer M.D on 09/27/2018 at 4:31 PM  Between 7am to 6pm - Pager - [phone]  After 6pm go to [email or web address] - password [email or web address]  [facility] Office  [phone]  CC: Primary care physician; Remi Haggard, FNP

## 2018-09-27 NOTE — Progress Notes (Signed)
Pt refused 0200 hr tx 

## 2018-12-04 DIAGNOSIS — R4182 Altered mental status, unspecified: Secondary | ICD-10-CM | POA: Insufficient documentation

## 2018-12-20 ENCOUNTER — Emergency Department: Payer: Medicaid Other

## 2018-12-20 ENCOUNTER — Emergency Department
Admission: EM | Admit: 2018-12-20 | Discharge: 2018-12-20 | Disposition: A | Discharge status: Home / Self Care | Payer: Medicaid Other | Attending: Emergency Medicine | Admitting: Emergency Medicine

## 2018-12-20 ENCOUNTER — Other Ambulatory Visit: Payer: Self-pay

## 2018-12-20 ENCOUNTER — Encounter: Payer: Self-pay | Admitting: Emergency Medicine

## 2018-12-20 DIAGNOSIS — Z992 Dependence on renal dialysis: Secondary | ICD-10-CM | POA: Insufficient documentation

## 2018-12-20 DIAGNOSIS — I3139 Other pericardial effusion (noninflammatory): Secondary | ICD-10-CM

## 2018-12-20 DIAGNOSIS — Z79899 Other long term (current) drug therapy: Secondary | ICD-10-CM | POA: Insufficient documentation

## 2018-12-20 DIAGNOSIS — I120 Hypertensive chronic kidney disease with stage 5 chronic kidney disease or end stage renal disease: Secondary | ICD-10-CM | POA: Diagnosis not present

## 2018-12-20 DIAGNOSIS — R1013 Epigastric pain: Secondary | ICD-10-CM | POA: Diagnosis present

## 2018-12-20 DIAGNOSIS — J449 Chronic obstructive pulmonary disease, unspecified: Secondary | ICD-10-CM | POA: Diagnosis not present

## 2018-12-20 DIAGNOSIS — I313 Pericardial effusion (noninflammatory): Secondary | ICD-10-CM | POA: Diagnosis not present

## 2018-12-20 DIAGNOSIS — N186 End stage renal disease: Secondary | ICD-10-CM | POA: Diagnosis not present

## 2018-12-20 DIAGNOSIS — F17210 Nicotine dependence, cigarettes, uncomplicated: Secondary | ICD-10-CM | POA: Insufficient documentation

## 2018-12-20 DIAGNOSIS — R109 Unspecified abdominal pain: Secondary | ICD-10-CM

## 2018-12-20 LAB — CBC
HCT: 35.9 % — ABNORMAL LOW (ref 36.0–46.0)
HEMOGLOBIN: 11.1 g/dL — AB (ref 12.0–15.0)
MCH: 28.8 pg (ref 26.0–34.0)
MCHC: 30.9 g/dL (ref 30.0–36.0)
MCV: 93 fL (ref 80.0–100.0)
NRBC: 0 % (ref 0.0–0.2)
Platelets: 219 10*3/uL (ref 150–400)
RBC: 3.86 MIL/uL — AB (ref 3.87–5.11)
RDW: 15.9 % — ABNORMAL HIGH (ref 11.5–15.5)
WBC: 5.7 10*3/uL (ref 4.0–10.5)

## 2018-12-20 LAB — COMPREHENSIVE METABOLIC PANEL
ALBUMIN: 3.1 g/dL — AB (ref 3.5–5.0)
ALT: 57 U/L — ABNORMAL HIGH (ref 0–44)
ANION GAP: 7 (ref 5–15)
AST: 35 U/L (ref 15–41)
Alkaline Phosphatase: 125 U/L (ref 38–126)
BILIRUBIN TOTAL: 0.7 mg/dL (ref 0.3–1.2)
BUN: 19 mg/dL (ref 6–20)
CO2: 32 mmol/L (ref 22–32)
Calcium: 8.7 mg/dL — ABNORMAL LOW (ref 8.9–10.3)
Chloride: 97 mmol/L — ABNORMAL LOW (ref 98–111)
Creatinine, Ser: 3.34 mg/dL — ABNORMAL HIGH (ref 0.44–1.00)
GFR, EST AFRICAN AMERICAN: 18 mL/min — AB (ref >60)
GFR, EST NON AFRICAN AMERICAN: 15 mL/min — AB (ref >60)
Glucose, Bld: 79 mg/dL (ref 70–99)
POTASSIUM: 3.5 mmol/L (ref 3.5–5.1)
Sodium: 136 mmol/L (ref 135–145)
TOTAL PROTEIN: 6.8 g/dL (ref 6.5–8.1)

## 2018-12-20 LAB — TROPONIN I

## 2018-12-20 LAB — LIPASE, BLOOD: Lipase: 49 U/L (ref 11–51)

## 2018-12-20 MED ORDER — ACETAMINOPHEN 500 MG PO TABS
ORAL_TABLET | ORAL | Status: AC
  Administered 2018-12-20: 500 mg
  Filled 2018-12-20: qty 1

## 2018-12-20 MED ORDER — ALUM & MAG HYDROXIDE-SIMETH 200-200-20 MG/5ML PO SUSP
30.0000 mL | Freq: Once | ORAL | Status: AC
  Administered 2018-12-20: 30 mL via ORAL

## 2018-12-20 MED ORDER — PANTOPRAZOLE SODIUM 40 MG PO TBEC: 40.0000 mg | DELAYED_RELEASE_TABLET | Freq: Every day | ORAL | 0 refills | Status: AC

## 2018-12-20 MED ORDER — FENTANYL CITRATE (PF) 100 MCG/2ML IJ SOLN: 50.0000 ug | Freq: Once | INTRAMUSCULAR | Status: DC

## 2018-12-20 NOTE — ED Triage Notes (Signed)
Upper abdominal pain x 2 weeks. Feels like she is bloated.

## 2018-12-20 NOTE — ED Provider Notes (Addendum)
[facility] Emergency Department Provider Note  ____________________________________________   I have reviewed the triage vital signs and the nursing notes. Where available I have reviewed prior notes and, if possible and indicated, outside hospital notes.    HISTORY  Chief Complaint Abdominal Pain    HPI Janet Olson is a 48 y.o. female with a history of chronic epigastric abdominal discomfort and reflux disease presents today complaining of epigastric pain.  Patient has been seen and evaluated for this before with negative right upper quadrant ultrasounds.  She states that she has not been taking very much antiacid's.  She also states that she has a history of constipation and she is having hard stools.  She states she is not impacted she is having stools that are simply not satisfying.  And this is resulted in her feeling "backed up".  She has not had any vomiting.  She denies any fever or chills.  Patient is somewhat vague historian.  Symptoms were there for several weeks.  Similar to prior episodes.  Does have a history of peptic ulcer disease from "a long time ago".  Intermittently compliant with antiacid's, food related pain, no melena no bright red blood per rectum and she states that this pain did not suddenly get worse this is been nagging the present.  Past Medical History:  Diagnosis Date  . Acute renal failure (ARF) ([place]) 05/17/2017  . Anemia   . Asthma   . Cardiomegaly   . Chronic kidney disease   . COPD (chronic obstructive pulmonary disease) ([place])   . Dialysis patient ([place])    MON, WED , FRI  . Dyspnea   . Enlarged heart   . GERD (gastroesophageal reflux disease)   . Headache    Migraines  . Hypertension   . Nonrheumatic tricuspid (valve) insufficiency   . Ulcer   . Vitamin D deficiency     Patient Active Problem List   Diagnosis Date Noted  . Acute respiratory failure ([place]) 09/25/2018  . Renal dialysis device, implant, or  graft complication 97/67/3419  . Colon cancer screening   . Respiratory failure with hypoxia ([place]) 04/21/2018  . ESRD on dialysis ([place]) 08/02/2017  . COPD (chronic obstructive pulmonary disease) ([place]) 06/20/2017  . Anemia in chronic kidney disease 06/07/2017  . 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 Mass of left axilla 12/07/2013    Past Surgical History:  Procedure Laterality Date  . A/V FISTULAGRAM Left 04/17/2018   Procedure: A/V FISTULAGRAM;  Surgeon: Algernon Huxley, MD;  Location: [place] CV [facility];  Service: Cardiovascular;  Laterality: Left;  . A/V FISTULAGRAM Left 05/27/2018   Procedure: A/V FISTULAGRAM;  Surgeon: Katha Cabal, MD;  Location: [place] [facility];  Service: Cardiovascular;  Laterality: Left;  . AV FISTULA PLACEMENT Left 08/08/2017   Procedure: ARTERIOVENOUS (AV) FISTULA CREATION ( BRACHIOCEPHALIC );  Surgeon: Algernon Huxley, MD;  Location: [facility] ORS;  Service: Vascular;  Laterality: Left;  . CESAREAN SECTION     x 4 (1988, 1991, 1997, 2000 )  . COLONOSCOPY WITH PROPOFOL N/A 04/24/2018   Procedure: COLONOSCOPY WITH PROPOFOL;  Surgeon: Lin Landsman, MD;  Location: [facility] ENDOSCOPY;  Service: Gastroenterology;  Laterality: N/A;  . DIALYSIS/PERMA CATHETER INSERTION N/A 07/16/2017   Procedure: Dialysis/Perma Catheter Insertion;  Surgeon: Katha Cabal, MD;  Location: [place] [facility];  Service: Cardiovascular;  Laterality: N/A;  . DIALYSIS/PERMA CATHETER REMOVAL N/A 10/29/2017   Procedure: DIALYSIS/PERMA CATHETER REMOVAL;  Surgeon: Katha Cabal,  MD;  Location: [place] [facility];  Service: Cardiovascular;  Laterality: N/A;  . excision of lymph nodes Bilateral 2014   bilateral under arms.    Prior to Admission medications   Medication Sig Start Date End Date Taking? Authorizing Provider  albuterol (PROVENTIL HFA;VENTOLIN HFA) 108 (90 Base) MCG/ACT inhaler Inhale 2 puffs into the lungs every 6 (six) hours as needed for wheezing or  shortness of breath. 06/20/17  Yes Johnson, Megan P, DO  albuterol (PROVENTIL) (2.5 MG/3ML) 0.083% nebulizer solution Take 3 mLs (2.5 mg total) by nebulization 3 (three) times daily. Patient taking differently: Take 2.5 mg by nebulization every 4 (four) hours as needed for shortness of breath.  06/20/17  Yes Johnson, Megan P, DO  amLODipine (NORVASC) 10 MG tablet Take 1 tablet (10 mg total) by mouth daily. 06/12/17  Yes Doles-Johnson, Teah, NP  benzonatate (TESSALON) 100 MG capsule Take 1 capsule (100 mg total) by mouth 3 (three) times daily. 09/27/18  Yes Loletha Grayer, MD  calcium acetate (PHOSLO) 667 MG capsule Take 2 capsules (1,334 mg total) by mouth 3 (three) times daily with meals. 09/27/18  Yes Wieting, Richard, MD  DULERA 200-5 MCG/ACT AERO Inhale 2 puffs into the lungs 2 (two) times daily. 01/16/18  Yes [provider]  DULoxetine (CYMBALTA) 60 MG capsule Take 60 mg by mouth 2 (two) times daily.  12/05/17  Yes [provider]  lidocaine-prilocaine (EMLA) cream Apply 1 application topically daily as needed [facility] access for dialysis).   Yes [provider]  metoprolol tartrate (LOPRESSOR) 25 MG tablet Take 1 tablet (25 mg total) by mouth 2 (two) times daily. 06/12/17  Yes Doles-Johnson, Teah, NP  multivitamin (RENA-VIT) TABS tablet Take 1 tablet by mouth daily. 08/23/17  Yes [provider]  pantoprazole (PROTONIX) 40 MG tablet Take 1 tablet (40 mg total) by mouth daily. 02/22/18  Yes Paulette Blanch, MD  torsemide (DEMADEX) 100 MG tablet Take 1 tablet (100 mg total) by mouth daily. 09/27/18  Yes Wieting, Richard, MD  Vitamin D, Ergocalciferol, (DRISDOL) 50000 units CAPS capsule Take 50,000 Units by mouth every Sunday.  10/22/17  Yes [provider]  azithromycin (ZITHROMAX) 250 MG tablet One tab daily for three days Patient not taking: Reported on 12/20/2018 09/28/18   Loletha Grayer, MD  chlorpheniramine-HYDROcodone (TUSSIONEX) 10-8 MG/5ML SUER Take 5 mLs  by mouth every 12 (twelve) hours as needed for cough. Patient not taking: Reported on 12/20/2018 09/27/18   Loletha Grayer, MD  nicotine (NICODERM CQ - DOSED IN MG/24 HOURS) 21 mg/24hr patch One patch to skin daily (okay to substitute generic) Patient not taking: Reported on 12/20/2018 09/27/18   Loletha Grayer, MD  oxyCODONE (ROXICODONE) 5 MG immediate release tablet Take 1 tablet (5 mg total) by mouth every 6 (six) hours as needed for moderate pain or severe pain. Patient not taking: Reported on 12/20/2018 09/27/18   Loletha Grayer, MD  predniSONE (DELTASONE) 20 MG tablet two tabs po daily for three days then stop Patient not taking: Reported on 12/20/2018 09/27/18   Loletha Grayer, MD  tiotropium (SPIRIVA) 18 MCG inhalation capsule Place 1 capsule (18 mcg total) into inhaler and inhale 2 (two) times daily. Patient not taking: Reported on 12/20/2018 06/12/17   Doles-Johnson, Leonarda Salon, NP    Allergies Patient has no known allergies.  Family History  Problem Relation Age of Onset  . Diabetes Mother     Social History Social History   Tobacco Use  . Smoking status: Current Every Day  Smoker    Packs/day: 0.50    Years: 30.00    Pack years: 15.00    Types: Cigarettes  . Smokeless tobacco: Never Used  Substance Use Topics  . Alcohol use: No  . Drug use: No    Review of Systems Constitutional: No fever/chills Eyes: No visual changes. ENT: No sore throat. No stiff neck no neck pain Cardiovascular: Denies chest pain. Respiratory: Denies shortness of breath. Gastrointestinal:   no vomiting.  No diarrhea.  No constipation. Genitourinary: Negative for dysuria. Musculoskeletal: Negative lower extremity swelling Skin: Negative for rash. Neurological: Negative for severe headaches, focal weakness or numbness.   ____________________________________________   PHYSICAL EXAM:  VITAL SIGNS: ED Triage Vitals  Enc Vitals Group     BP 12/20/18 1538 (!) 149/88     Pulse Rate  12/20/18 1538 (!) 102     Resp 12/20/18 1538 20     Temp 12/20/18 1538 98.5 F (36.9 C)     Temp Source 12/20/18 1538 Oral     SpO2 12/20/18 1538 100 %     Weight 12/20/18 1539 200 lb (90.7 kg)     Height 12/20/18 1539 5\' 4"  (1.626 m)     Head Circumference --      Peak Flow --      Pain Score 12/20/18 1539 9     Pain Loc --      Pain Edu? --      Excl. in [place]? --     Constitutional: Alert and oriented. Well appearing and in no acute distress. Eyes: Conjunctivae are normal Head: Atraumatic HEENT: No congestion/rhinnorhea. Mucous membranes are moist.  Oropharynx non-erythematous Neck:   Nontender with no meningismus, no masses, no stridor Cardiovascular: Normal rate, regular rhythm. Grossly normal heart sounds.  Good peripheral circulation. Respiratory: Normal respiratory effort.  No retractions. Lungs CTAB. Abdominal: Soft and minimal epigastric discomfort. No distention. No guarding no rebound Back:  There is no focal tenderness or step off.  there is no midline tenderness there are no lesions noted. there is no CVA tenderness Musculoskeletal: No lower extremity tenderness, no upper extremity tenderness. No joint effusions, no DVT signs strong distal pulses no edema Neurologic:  Normal speech and language. No gross focal neurologic deficits are appreciated.  Skin:  Skin is warm, dry and intact. No rash noted. Psychiatric: Mood and affect are normal. Speech and behavior are normal.  ____________________________________________   LABS (all labs ordered are listed, but only abnormal results are displayed)  Labs Reviewed  COMPREHENSIVE METABOLIC PANEL - Abnormal; Notable for the following components:      Result Value   Chloride 97 (*)    Creatinine, Ser 3.34 (*)    Calcium 8.7 (*)    Albumin 3.1 (*)    ALT 57 (*)    GFR calc non Af Amer 15 (*)    GFR calc Af Amer 18 (*)    All other components within normal limits  CBC - Abnormal; Notable for the following components:    RBC 3.86 (*)    Hemoglobin 11.1 (*)    HCT 35.9 (*)    RDW 15.9 (*)    All other components within normal limits  LIPASE, BLOOD  TROPONIN I  URINALYSIS, COMPLETE (UACMP) WITH MICROSCOPIC    Pertinent labs  results that were available during my care of the patient were reviewed by me and considered in my medical decision making (see chart for details). ____________________________________________  EKG  I personally interpreted any EKGs ordered  by me or triage Normal sinus rhythm rate 90 bpm no acute ST elevation or depression normal axis, nonspecific ST changes ____________________________________________  RADIOLOGY  Pertinent labs & imaging results that were available during my care of the patient were reviewed by me and considered in my medical decision making (see chart for details). If possible, patient and/or family made aware of any abnormal findings.  Dg Abdomen Acute W/chest  Result Date: 12/20/2018 CLINICAL DATA:  Nausea and bloating. EXAM: DG ABDOMEN ACUTE W/ 1V CHEST COMPARISON:  Chest x-ray 09/27/2018. FINDINGS: Marked enlargement of the cardiopericardial silhouette, stable. Pulmonary vascular congestion is similar to prior. No overt edema or pleural effusion. Upright film shows no evidence for intraperitoneal free air. Supine film shows diffuse gaseous small bowel distention without overt obstructive pattern. Visualized bony anatomy unremarkable. IMPRESSION: 1. Cardiomegaly without acute cardiopulmonary findings. 2. No intraperitoneal free air. 3. Diffuse gaseous distention of small bowel without an overtly obstructive pattern. Ileus or gastroenteritis would be a consideration. Electronically Signed   By: Misty Stanley M.D.   On: 12/20/2018 17:24   ____________________________________________    PROCEDURES  Procedure(s) performed: None  Procedures  Critical Care performed: None  ____________________________________________   INITIAL IMPRESSION / ASSESSMENT AND  PLAN / ED COURSE  Pertinent labs & imaging results that were available during my care of the patient were reviewed by me and considered in my medical decision making (see chart for details).  Patient here with 4 weeks of abdominal pain this time and recurrent abdominal pain in the past.  Has not had a CT scan for this.  Nonsurgical abdomen most likely this is reflux or peptic ulcer disease patient has a long history of this she states.  Initially my plan was to recheck blood work x-ray and send her home if there is no obvious pathology from an x-ray shows a somewhat concerning pattern of abdominal gas therefore we will obtain imaging to ensure that there is no oncologic or other process causing her this recurrent pathology.  Nothing at this time to suggest that she has a perforation or severe abdominal pain or surgical abdomen.  Serial exams are benign.  If CT is reassuring we will get her home with close outpatient follow-up with GI  ----------------------------------------- 7:08 PM on 12/20/2018 -----------------------------------------  CT scan shows some unexpected findings.  Patient does have a pericardial effusion, it was not there in early 2018 but otherwise is acute is not known.  She is not complaining of shortness of breath except for the fact that she needs dialysis tomorrow and she is a little bit fluid overloaded.  There is no pericardial tamponade physiology noticed on CT and there is none clinically evident.  I did talk to Dr. Nehemiah Massed.  He and I talked about the patient's findings, her CT findings, the exam findings etc., and he does not feel the patient requires admission for this he will see her as an outpatient in the next couple days.  The other finding that we notices that there is a questionable on CT scan fluid around the gallbladder.  However she has no known history of gallstones there was a negative ultrasound of her gallbladder less than a year ago, her pain is only epigastric  she has no right upper quadrant pain when her liver function tests are reassuring.  There is no evidence of certainly acute gallbladder disease for this pain that she is been having for years.  I did offer her an ultrasound" of chronic but she states  she would rather go home she is tired of being here and she has no pain at this time she is requesting discharge.  She understands limitations of her work-up without a ultrasound but frankly I have low suspicion the option is going to change her management at this time.  And I do not actually think her pain is related to her pericardial effusion.  Patient has food related discomfort which she believes to be related chronically to her stomach acid which I think is not an unreasonable approach to this problem.  Nonetheless, I did talk to cardiology and they feel she is safe for discharge I will also talk to her nephrologist to make sure there is no other suggested course of action from their point of view and to ensure good follow up.  We will see what nephrology's input is as she is followed by them about these issues, and again she is hemodynamically stable with no evidence of tamponade, no JVD, we really do not know how long this CT finding pericardial effusion has been there.  ----------------------------------------- 7:17 PM on 12/20/2018 -----------------------------------------  I also talked to Dr. Candiss Norse, who is working with Dr. Holley Raring, the patient's personal nephrologist.  He also feels that this is something they can take care of an outpatient.  He does not wish further imaging or admission to the hospital.  He states he and Dr. Holley Raring will follow up on it.  They did asked me to send an ANA, which I have ordered but he understands I will not personally follow-up on, nephrology states that they will follow-up on the ANA results as part of their work-up.  I did they that as an add-on to her blood down stairs.  I personally really out of town and I have told  him that I will not follow-up on this blood work and they understand that.  Extensive information given to the patient about all of the findings that we have made, and she is at the edge of the bed asking for her IV to be filled and asking to go home.  We will therefore discharge her belly is certainly not surgical.  Again she has fluid possibly around the gallbladder but she is fluid overloaded in a dialysis patient with no evidence of gallbladder disease otherwise noted and I do not think an ultrasound is indicated even if the patient were willing to stay for it, which she is not.  Given that the patient is eager to go home and the cardiologist and nephrologist feel very comfortable with this plan as to why we will discharge her with extensive return precautions and follow-up given and understood    ____________________________________________   FINAL CLINICAL IMPRESSION(S) / ED DIAGNOSES  Final diagnoses:  None      This chart was dictated using voice recognition software.  Despite best efforts to proofread,  errors can occur which can change meaning.      Schuyler Amor, MD 12/20/18 1814    Schuyler Amor, MD 12/20/18 1919

## 2018-12-20 NOTE — ED Notes (Signed)
Pt refused to have any more lab drawn, she said she would follow up with [facility]. Dr. Burlene Arnt made aware

## 2018-12-20 NOTE — Discharge Instructions (Addendum)
Have some fluid around your heart, please follow-up with a cardiologist, as well as your nephrologist.  They are aware of this.  If you have chest pain shortness of breath, or feel like you might pass out or other new or worrisome symptoms please return to the emergency room.  Please take your antiacid's as prescribed.  Please follow-up with the GI doctor listed above for your chronic abdominal pain.  If you have severe abdominal pain nausea vomiting or other concerns return to the emergency department.  Do not fail to take your MiraLAX as I think this will help you feel better.  Do not feel to go to dialysis.  We wish you a Merry Christmas.  Please come back if you feel worse.

## 2018-12-20 NOTE — ED Notes (Signed)
Pt presents today for epigastric pain that started 2 wks ago. Pt states it has gotten worse over the this time. Pt states she has had excessive gas.

## 2019-01-03 ENCOUNTER — Other Ambulatory Visit: Payer: Self-pay

## 2019-01-03 ENCOUNTER — Emergency Department: Payer: Medicaid Other

## 2019-01-03 ENCOUNTER — Encounter: Payer: Self-pay | Admitting: Internal Medicine

## 2019-01-03 ENCOUNTER — Inpatient Hospital Stay
Admission: EM | Admit: 2019-01-03 | Discharge: 2019-01-04 | DRG: 291 | Disposition: A | Discharge status: Home / Self Care | Payer: Medicaid Other | Attending: Internal Medicine | Admitting: Internal Medicine

## 2019-01-03 DIAGNOSIS — Z716 Tobacco abuse counseling: Secondary | ICD-10-CM | POA: Diagnosis not present

## 2019-01-03 DIAGNOSIS — Z79899 Other long term (current) drug therapy: Secondary | ICD-10-CM

## 2019-01-03 DIAGNOSIS — J441 Chronic obstructive pulmonary disease with (acute) exacerbation: Secondary | ICD-10-CM | POA: Diagnosis present

## 2019-01-03 DIAGNOSIS — E876 Hypokalemia: Secondary | ICD-10-CM | POA: Diagnosis present

## 2019-01-03 DIAGNOSIS — Z8711 Personal history of peptic ulcer disease: Secondary | ICD-10-CM

## 2019-01-03 DIAGNOSIS — I361 Nonrheumatic tricuspid (valve) insufficiency: Secondary | ICD-10-CM | POA: Diagnosis present

## 2019-01-03 DIAGNOSIS — E8809 Other disorders of plasma-protein metabolism, not elsewhere classified: Secondary | ICD-10-CM | POA: Diagnosis present

## 2019-01-03 DIAGNOSIS — J810 Acute pulmonary edema: Secondary | ICD-10-CM | POA: Diagnosis not present

## 2019-01-03 DIAGNOSIS — N186 End stage renal disease: Secondary | ICD-10-CM | POA: Diagnosis present

## 2019-01-03 DIAGNOSIS — I5033 Acute on chronic diastolic (congestive) heart failure: Secondary | ICD-10-CM | POA: Diagnosis present

## 2019-01-03 DIAGNOSIS — K219 Gastro-esophageal reflux disease without esophagitis: Secondary | ICD-10-CM | POA: Diagnosis present

## 2019-01-03 DIAGNOSIS — J9601 Acute respiratory failure with hypoxia: Secondary | ICD-10-CM | POA: Diagnosis present

## 2019-01-03 DIAGNOSIS — D631 Anemia in chronic kidney disease: Secondary | ICD-10-CM | POA: Diagnosis present

## 2019-01-03 DIAGNOSIS — Z6837 Body mass index (BMI) 37.0-37.9, adult: Secondary | ICD-10-CM | POA: Diagnosis not present

## 2019-01-03 DIAGNOSIS — N2581 Secondary hyperparathyroidism of renal origin: Secondary | ICD-10-CM | POA: Diagnosis present

## 2019-01-03 DIAGNOSIS — I132 Hypertensive heart and chronic kidney disease with heart failure and with stage 5 chronic kidney disease, or end stage renal disease: Principal | ICD-10-CM | POA: Diagnosis present

## 2019-01-03 DIAGNOSIS — Z992 Dependence on renal dialysis: Secondary | ICD-10-CM | POA: Diagnosis not present

## 2019-01-03 DIAGNOSIS — J9621 Acute and chronic respiratory failure with hypoxia: Secondary | ICD-10-CM | POA: Diagnosis present

## 2019-01-03 DIAGNOSIS — Z833 Family history of diabetes mellitus: Secondary | ICD-10-CM

## 2019-01-03 DIAGNOSIS — E871 Hypo-osmolality and hyponatremia: Secondary | ICD-10-CM | POA: Diagnosis present

## 2019-01-03 DIAGNOSIS — E669 Obesity, unspecified: Secondary | ICD-10-CM | POA: Diagnosis present

## 2019-01-03 DIAGNOSIS — Z79891 Long term (current) use of opiate analgesic: Secondary | ICD-10-CM

## 2019-01-03 DIAGNOSIS — F17210 Nicotine dependence, cigarettes, uncomplicated: Secondary | ICD-10-CM | POA: Diagnosis present

## 2019-01-03 DIAGNOSIS — R740 Nonspecific elevation of levels of transaminase and lactic acid dehydrogenase [LDH]: Secondary | ICD-10-CM | POA: Diagnosis present

## 2019-01-03 HISTORY — DX: Acute respiratory failure with hypoxia: J96.01

## 2019-01-03 LAB — COMPREHENSIVE METABOLIC PANEL
ALT: 56 U/L — ABNORMAL HIGH (ref 0–44)
AST: 31 U/L (ref 15–41)
Albumin: 3 g/dL — ABNORMAL LOW (ref 3.5–5.0)
Alkaline Phosphatase: 152 U/L — ABNORMAL HIGH (ref 38–126)
Anion gap: 11 (ref 5–15)
BUN: 24 mg/dL — ABNORMAL HIGH (ref 6–20)
CO2: 27 mmol/L (ref 22–32)
CREATININE: 4.08 mg/dL — AB (ref 0.44–1.00)
Calcium: 8.3 mg/dL — ABNORMAL LOW (ref 8.9–10.3)
Chloride: 96 mmol/L — ABNORMAL LOW (ref 98–111)
GFR calc Af Amer: 14 mL/min — ABNORMAL LOW (ref >60)
GFR calc non Af Amer: 12 mL/min — ABNORMAL LOW (ref >60)
Glucose, Bld: 134 mg/dL — ABNORMAL HIGH (ref 70–99)
Potassium: 3.2 mmol/L — ABNORMAL LOW (ref 3.5–5.1)
Sodium: 134 mmol/L — ABNORMAL LOW (ref 135–145)
Total Bilirubin: 0.4 mg/dL (ref 0.3–1.2)
Total Protein: 6.6 g/dL (ref 6.5–8.1)

## 2019-01-03 LAB — BLOOD GAS, VENOUS
Acid-Base Excess: 2.2 mmol/L — ABNORMAL HIGH (ref 0.0–2.0)
Bicarbonate: 27.3 mmol/L (ref 20.0–28.0)
O2 Saturation: 92.9 %
Patient temperature: 37
pCO2, Ven: 43 mmHg — ABNORMAL LOW (ref 44.0–60.0)
pH, Ven: 7.41 (ref 7.250–7.430)
pO2, Ven: 66 mmHg — ABNORMAL HIGH (ref 32.0–45.0)

## 2019-01-03 LAB — MRSA PCR SCREENING: MRSA by PCR: NEGATIVE

## 2019-01-03 LAB — MAGNESIUM: MAGNESIUM: 1.7 mg/dL (ref 1.7–2.4)

## 2019-01-03 LAB — CBC WITH DIFFERENTIAL/PLATELET
Abs Immature Granulocytes: 0.04 10*3/uL (ref 0.00–0.07)
Basophils Absolute: 0 10*3/uL (ref 0.0–0.1)
Basophils Relative: 0 %
Eosinophils Absolute: 0.2 10*3/uL (ref 0.0–0.5)
Eosinophils Relative: 2 %
HCT: 35.6 % — ABNORMAL LOW (ref 36.0–46.0)
Hemoglobin: 11.2 g/dL — ABNORMAL LOW (ref 12.0–15.0)
Immature Granulocytes: 0 %
Lymphocytes Relative: 16 %
Lymphs Abs: 1.5 10*3/uL (ref 0.7–4.0)
MCH: 28.2 pg (ref 26.0–34.0)
MCHC: 31.5 g/dL (ref 30.0–36.0)
MCV: 89.7 fL (ref 80.0–100.0)
Monocytes Absolute: 0.6 10*3/uL (ref 0.1–1.0)
Monocytes Relative: 6 %
Neutro Abs: 7.5 10*3/uL (ref 1.7–7.7)
Neutrophils Relative %: 76 %
Platelets: 249 10*3/uL (ref 150–400)
RBC: 3.97 MIL/uL (ref 3.87–5.11)
RDW: 15.1 % (ref 11.5–15.5)
WBC: 9.9 10*3/uL (ref 4.0–10.5)
nRBC: 0 % (ref 0.0–0.2)

## 2019-01-03 LAB — BRAIN NATRIURETIC PEPTIDE: B Natriuretic Peptide: 1028 pg/mL — ABNORMAL HIGH (ref 0.0–100.0)

## 2019-01-03 LAB — TROPONIN I
Troponin I: <0.03 ng/mL (ref <0.03)
Troponin I: <0.03 ng/mL (ref <0.03)
Troponin I: <0.03 ng/mL (ref <0.03)

## 2019-01-03 LAB — PREALBUMIN: PREALBUMIN: 25.2 mg/dL (ref 18–38)

## 2019-01-03 LAB — PHOSPHORUS: Phosphorus: 3.1 mg/dL (ref 2.5–4.6)

## 2019-01-03 LAB — T4, FREE: Free T4: 1.2 ng/dL (ref 0.82–1.77)

## 2019-01-03 LAB — TSH: TSH: 0.531 u[IU]/mL (ref 0.350–4.500)

## 2019-01-03 MED ORDER — METOPROLOL TARTRATE 25 MG PO TABS
25.0000 mg | ORAL_TABLET | Freq: Two times a day (BID) | ORAL | Status: DC
  Administered 2019-01-03 – 2019-01-04 (×3): 25 mg via ORAL
  Filled 2019-01-03 (×3): qty 1

## 2019-01-03 MED ORDER — AZITHROMYCIN 500 MG PO TABS
500.0000 mg | ORAL_TABLET | Freq: Once | ORAL | Status: AC
  Administered 2019-01-03: 500 mg via ORAL
  Filled 2019-01-03: qty 1

## 2019-01-03 MED ORDER — SENNOSIDES-DOCUSATE SODIUM 8.6-50 MG PO TABS: 1.0000 | ORAL_TABLET | Freq: Every evening | ORAL | Status: DC | PRN

## 2019-01-03 MED ORDER — AZITHROMYCIN 250 MG PO TABS: ORAL_TABLET | ORAL | 0 refills | Status: DC

## 2019-01-03 MED ORDER — BISACODYL 5 MG PO TBEC: 5.0000 mg | DELAYED_RELEASE_TABLET | Freq: Every day | ORAL | Status: DC | PRN

## 2019-01-03 MED ORDER — ONDANSETRON HCL 4 MG/2ML IJ SOLN: 4.0000 mg | Freq: Four times a day (QID) | INTRAMUSCULAR | Status: DC | PRN

## 2019-01-03 MED ORDER — MOMETASONE FURO-FORMOTEROL FUM 200-5 MCG/ACT IN AERO
2.0000 | INHALATION_SPRAY | Freq: Two times a day (BID) | RESPIRATORY_TRACT | Status: DC
  Administered 2019-01-03 – 2019-01-04 (×3): 2 via RESPIRATORY_TRACT
  Filled 2019-01-03: qty 8.8

## 2019-01-03 MED ORDER — IPRATROPIUM-ALBUTEROL 0.5-2.5 (3) MG/3ML IN SOLN
3.0000 mL | Freq: Once | RESPIRATORY_TRACT | Status: AC
  Administered 2019-01-03: 3 mL via RESPIRATORY_TRACT
  Filled 2019-01-03: qty 3

## 2019-01-03 MED ORDER — GUAIFENESIN-DM 100-10 MG/5ML PO SYRP: 5.0000 mL | ORAL_SOLUTION | ORAL | Status: DC | PRN

## 2019-01-03 MED ORDER — METHYLPREDNISOLONE SODIUM SUCC 125 MG IJ SOLR
60.0000 mg | Freq: Two times a day (BID) | INTRAMUSCULAR | Status: AC
  Administered 2019-01-03 (×2): 60 mg via INTRAVENOUS
  Filled 2019-01-03 (×2): qty 2

## 2019-01-03 MED ORDER — FUROSEMIDE 10 MG/ML IJ SOLN
80.0000 mg | Freq: Once | INTRAMUSCULAR | Status: AC
  Administered 2019-01-03: 80 mg via INTRAVENOUS
  Filled 2019-01-03: qty 8

## 2019-01-03 MED ORDER — OXYCODONE HCL 5 MG PO TABS
5.0000 mg | ORAL_TABLET | ORAL | Status: DC | PRN
  Administered 2019-01-03 – 2019-01-04 (×3): 5 mg via ORAL
  Filled 2019-01-03 (×3): qty 1

## 2019-01-03 MED ORDER — ACETAMINOPHEN 10 MG/ML IV SOLN
1000.0000 mg | Freq: Once | INTRAVENOUS | Status: AC
  Administered 2019-01-03: 1000 mg via INTRAVENOUS
  Filled 2019-01-03: qty 100

## 2019-01-03 MED ORDER — TORSEMIDE 100 MG PO TABS
100.0000 mg | ORAL_TABLET | Freq: Every day | ORAL | Status: DC
  Administered 2019-01-03 – 2019-01-04 (×2): 100 mg via ORAL
  Filled 2019-01-03 (×2): qty 1

## 2019-01-03 MED ORDER — IPRATROPIUM-ALBUTEROL 0.5-2.5 (3) MG/3ML IN SOLN
3.0000 mL | RESPIRATORY_TRACT | Status: DC
  Administered 2019-01-03: 3 mL via RESPIRATORY_TRACT
  Filled 2019-01-03: qty 3

## 2019-01-03 MED ORDER — NICOTINE 14 MG/24HR TD PT24
14.0000 mg | MEDICATED_PATCH | Freq: Every day | TRANSDERMAL | Status: DC
  Administered 2019-01-03 – 2019-01-04 (×2): 14 mg via TRANSDERMAL
  Filled 2019-01-03 (×2): qty 1

## 2019-01-03 MED ORDER — IPRATROPIUM-ALBUTEROL 0.5-2.5 (3) MG/3ML IN SOLN
3.0000 mL | Freq: Four times a day (QID) | RESPIRATORY_TRACT | Status: DC
  Administered 2019-01-03 – 2019-01-04 (×3): 3 mL via RESPIRATORY_TRACT
  Filled 2019-01-03 (×3): qty 3

## 2019-01-03 MED ORDER — NITROGLYCERIN 0.4 MG SL SUBL: 0.4000 mg | SUBLINGUAL_TABLET | SUBLINGUAL | Status: DC | PRN

## 2019-01-03 MED ORDER — AMLODIPINE BESYLATE 10 MG PO TABS
10.0000 mg | ORAL_TABLET | Freq: Every day | ORAL | Status: DC
  Administered 2019-01-03 – 2019-01-04 (×2): 10 mg via ORAL
  Filled 2019-01-03 (×2): qty 1

## 2019-01-03 MED ORDER — SODIUM CHLORIDE 0.9% FLUSH
10.0000 mL | Freq: Two times a day (BID) | INTRAVENOUS | Status: DC
  Administered 2019-01-03 – 2019-01-04 (×3): 10 mL via INTRAVENOUS

## 2019-01-03 MED ORDER — ACETAMINOPHEN 650 MG RE SUPP: 650.0000 mg | Freq: Four times a day (QID) | RECTAL | Status: DC | PRN

## 2019-01-03 MED ORDER — PREDNISONE 50 MG PO TABS: 50.0000 mg | ORAL_TABLET | Freq: Every day | ORAL | 0 refills | Status: DC

## 2019-01-03 MED ORDER — BENZONATATE 100 MG PO CAPS
200.0000 mg | ORAL_CAPSULE | Freq: Once | ORAL | Status: AC
  Administered 2019-01-03: 200 mg via ORAL
  Filled 2019-01-03: qty 2

## 2019-01-03 MED ORDER — ONDANSETRON HCL 4 MG PO TABS
4.0000 mg | ORAL_TABLET | Freq: Four times a day (QID) | ORAL | Status: DC | PRN
  Administered 2019-01-03 (×2): 4 mg via ORAL
  Filled 2019-01-03 (×2): qty 1

## 2019-01-03 MED ORDER — CALCIUM ACETATE (PHOS BINDER) 667 MG PO CAPS
1334.0000 mg | ORAL_CAPSULE | Freq: Three times a day (TID) | ORAL | Status: DC
  Administered 2019-01-03 – 2019-01-04 (×4): 1334 mg via ORAL
  Filled 2019-01-03 (×4): qty 2

## 2019-01-03 MED ORDER — BUTALBITAL-APAP-CAFFEINE 50-325-40 MG PO TABS
2.0000 | ORAL_TABLET | Freq: Once | ORAL | Status: AC
  Administered 2019-01-03: 2 via ORAL
  Filled 2019-01-03: qty 2

## 2019-01-03 MED ORDER — PANTOPRAZOLE SODIUM 40 MG PO TBEC
40.0000 mg | DELAYED_RELEASE_TABLET | Freq: Every day | ORAL | Status: DC
  Administered 2019-01-03 – 2019-01-04 (×2): 40 mg via ORAL
  Filled 2019-01-03 (×2): qty 1

## 2019-01-03 MED ORDER — METHYLPREDNISOLONE SODIUM SUCC 125 MG IJ SOLR
125.0000 mg | Freq: Once | INTRAMUSCULAR | Status: AC
  Administered 2019-01-03: 125 mg via INTRAVENOUS
  Filled 2019-01-03: qty 2

## 2019-01-03 MED ORDER — PREDNISONE 20 MG PO TABS
40.0000 mg | ORAL_TABLET | Freq: Every day | ORAL | Status: DC
  Administered 2019-01-04: 40 mg via ORAL
  Filled 2019-01-03: qty 2

## 2019-01-03 MED ORDER — ASPIRIN 81 MG PO CHEW
81.0000 mg | CHEWABLE_TABLET | Freq: Every day | ORAL | Status: DC
  Administered 2019-01-04: 81 mg via ORAL
  Filled 2019-01-03 (×2): qty 1

## 2019-01-03 MED ORDER — RENA-VITE PO TABS
1.0000 | ORAL_TABLET | Freq: Every day | ORAL | Status: DC
  Administered 2019-01-03 – 2019-01-04 (×2): 1 via ORAL
  Filled 2019-01-03 (×2): qty 1

## 2019-01-03 MED ORDER — ACETAMINOPHEN 325 MG PO TABS: 650.0000 mg | ORAL_TABLET | Freq: Four times a day (QID) | ORAL | Status: DC | PRN

## 2019-01-03 MED ORDER — HEPARIN SODIUM (PORCINE) 5000 UNIT/ML IJ SOLN
5000.0000 [IU] | Freq: Three times a day (TID) | INTRAMUSCULAR | Status: DC
  Administered 2019-01-03 – 2019-01-04 (×3): 5000 [IU] via SUBCUTANEOUS
  Filled 2019-01-03 (×3): qty 1

## 2019-01-03 MED ORDER — BENZONATATE 200 MG PO CAPS: 200.0000 mg | ORAL_CAPSULE | Freq: Four times a day (QID) | ORAL | 0 refills | Status: DC | PRN

## 2019-01-03 MED ORDER — DULOXETINE HCL 30 MG PO CPEP
60.0000 mg | ORAL_CAPSULE | Freq: Two times a day (BID) | ORAL | Status: DC
  Administered 2019-01-03 – 2019-01-04 (×3): 60 mg via ORAL
  Filled 2019-01-03 (×3): qty 2

## 2019-01-03 MED ORDER — FENTANYL CITRATE (PF) 100 MCG/2ML IJ SOLN
50.0000 ug | Freq: Once | INTRAMUSCULAR | Status: AC
  Administered 2019-01-03: 50 ug via INTRAVENOUS
  Filled 2019-01-03: qty 2

## 2019-01-03 NOTE — Progress Notes (Signed)
Advanced Care Plan.  Purpose of Encounter: CODE STATUS. Parties in Attendance: The patient and me. Patient's Decisional Capacity: Yes. Medical Story: Janet Olson  is a 49 y.o. female with a known history of obesity, HTN, ESRD (HD M/W/F via LUE AVF), COPD, chronic diastolic CHF.  She is admitted for acute on chronic respiratory failure with hypoxia due to COPD exacerbation and fluid overload, acute on chronic diastolic CHF.  I discussed with the patient about her current condition, prognosis and CODE STATUS.  The patient want to be resuscitated and intubated if she has a cardiopulmonary arrest.  Plan:  Code Status: Full code. Time spent discussing advance care planning: 17 minutes.

## 2019-01-03 NOTE — ED Triage Notes (Signed)
Pt had parathyroid surgery on 12/29/2018. Pt takes daily dialysis. Pt with bilateral leg edema 3+. Pt received duoneb x1 with improvement in shob. Pt with ra pox of 89%.

## 2019-01-03 NOTE — Progress Notes (Signed)
To Dialysis via bed 

## 2019-01-03 NOTE — ED Notes (Signed)
Pt requesting medication for headache

## 2019-01-03 NOTE — ED Notes (Signed)
Discussed additional 650mg  of tylenol with 1000mg  iv tylenol administration with dr. Mable Paris. md states to proceed with administration of additional 650mg  tylenol in fiorcet.

## 2019-01-03 NOTE — Progress Notes (Signed)
This note also relates to the following rows which could not be included: Pulse Rate - Cannot attach notes to unvalidated device data Resp - Cannot attach notes to unvalidated device data BP - Cannot attach notes to unvalidated device data  Hd started  

## 2019-01-03 NOTE — Plan of Care (Signed)
  Problem: Education: Goal: Knowledge of General Education information will improve Description Including pain rating scale, medication(s)/side effects and non-pharmacologic comfort measures Outcome: Progressing   Problem: Health Behavior/Discharge Planning: Goal: Ability to manage health-related needs will improve Outcome: Progressing   Problem: Clinical Measurements: Goal: Ability to maintain clinical measurements within normal limits will improve Outcome: Progressing Goal: Will remain free from infection Outcome: Progressing Note:  Remains afebrile   Problem: Clinical Measurements: Goal: Diagnostic test results will improve Outcome: Not Progressing Note:  K 3.2, BUN 24/4.08 BNP 1028 Goal: Respiratory complications will improve Outcome: Not Progressing Note:  Remains on 2LO2 per [place], lungs diminished with fine crackles bil   Problem: Pain Managment: Goal: General experience of comfort will improve Outcome: Not Progressing Note:  PRN medications   Problem: Clinical Measurements: Goal: Diagnostic test results will improve Outcome: Not Progressing Note:  K 3.2, BUN 24/4.08 BNP 1028 Goal: Respiratory complications will improve Outcome: Not Progressing Note:  Remains on 2LO2 per [place], lungs diminished with fine crackles bil   Problem: Pain Managment: Goal: General experience of comfort will improve Outcome: Not Progressing Note:  PRN medications

## 2019-01-03 NOTE — Progress Notes (Addendum)
The patient complains of bilateral leg edema.  Better shortness of breath on oxygen 3 L. Vital signs and lab reviewed. Physical examinations done.  Acute on chronic respiratory failure with hypoxia due to COPD exacerbation and fluid overload Acute on chronic diastolic CHF. Hypokalemia. ESRD on hemodialysis. Obesity Continue current treatment. Tobacco abuse, smoking cessation was counseled for 3 to 4 minutes, nicotine patch. Discussed with RN and Dr. Juleen China.  Time spent 26 minutes.

## 2019-01-03 NOTE — ED Provider Notes (Signed)
[facility] Emergency Department Provider Note  ____________________________________________   First MD Initiated Contact with Patient 01/03/19 [phone]     (approximate)  I have reviewed the triage vital signs and the nursing notes.   HISTORY  Chief Complaint Cough; Shortness of Breath; and Leg Swelling   HPI Janet Olson is a 49 y.o. female who comes to the emergency department via EMS with increasing shortness of breath and slightly productive cough for the past several days.  She has a complex past medical history including end-stage renal disease for which she normally receives dialysis Monday Wednesday and Friday.  She has been having daily dialysis for the past several days secondary to increased fluid from a recent parathyroidectomy this last week.  She also has a history of COPD.  She still makes urine.  Her symptoms have been gradual onset slowly progressive are now moderate to severe.  She feels out of breath with even the most minimal exertion.  She does have a cough that is productive  of small amounts of clear phlegm.  She denies fevers or chills.  She does have sharp upper chest pain only when coughing.  She also reports a headache which she attributes to persistent coughing.  She is concerned because her legs remain swollen despite dialysis.   Past Medical History:  Diagnosis Date  . Acute renal failure (ARF) ([place]) 05/17/2017  . Anemia   . Asthma   . Cardiomegaly   . Chronic kidney disease   . COPD (chronic obstructive pulmonary disease) ([place])   . Dialysis patient ([place])    MON, WED , FRI  . Dyspnea   . Enlarged heart   . GERD (gastroesophageal reflux disease)   . Headache    Migraines  . Hypertension   . Nonrheumatic tricuspid (valve) insufficiency   . Ulcer   . Vitamin D deficiency     Patient Active Problem List   Diagnosis Date Noted  . Acute hypoxemic respiratory failure ([place]) 01/03/2019  . Acute respiratory failure ([place])  09/25/2018  . Renal dialysis device, implant, or graft complication [id number]/[id number]  . Colon cancer screening   . Respiratory failure with hypoxia ([place]) 04/21/2018  . ESRD on dialysis ([place]) 08/02/2017  . COPD (chronic obstructive pulmonary disease) ([place]) 06/20/2017  . Anemia in chronic kidney disease 06/07/2017  . 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 Mass of left axilla 12/07/2013    Past Surgical History:  Procedure Laterality Date  . A/V FISTULAGRAM Left 04/17/2018   Procedure: A/V FISTULAGRAM;  Surgeon: Algernon Huxley, MD;  Location: [place] [facility];  Service: Cardiovascular;  Laterality: Left;  . A/V FISTULAGRAM Left 05/27/2018   Procedure: A/V FISTULAGRAM;  Surgeon: Katha Cabal, MD;  Location: [place] [facility];  Service: Cardiovascular;  Laterality: Left;  . AV FISTULA PLACEMENT Left 08/08/2017   Procedure: ARTERIOVENOUS (AV) FISTULA CREATION ( BRACHIOCEPHALIC );  Surgeon: Algernon Huxley, MD;  Location: [facility] OR[facility];  Service: Vascular;  Laterality: Left;  . CESAREAN SECTION     x 4 (1988, 1991, 1997, 2000 )  . COLONOSCOPY WITH PROPOFOL N/A 04/24/2018   Procedure: COLONOSCOPY WITH PROPOFOL;  Surgeon: Lin Landsman, MD;  Location: [facility] ENDOSCOPY;  Service: Gastroenterology;  Laterality: N/A;  . DIALYSIS/PERMA CATHETER INSERTION N/A 07/16/2017   Procedure: Dialysis/Perma Catheter Insertion;  Surgeon: Katha Cabal, MD;  Location: [place] [facility];  Service: Cardiovascular;  Laterality: N/A;  . DIALYSIS/PERMA CATHETER REMOVAL N/A 10/29/2017   Procedure: DIALYSIS/PERMA  CATHETER REMOVAL;  Surgeon: Katha Cabal, MD;  Location: [place] [facility];  Service: Cardiovascular;  Laterality: N/A;  . excision of lymph nodes Bilateral 2014   bilateral under arms.    Prior to Admission medications   Medication Sig Start Date End Date Taking? Authorizing Provider  amLODipine (NORVASC) 10 MG tablet Take 1 tablet (10 mg total) by mouth daily. 06/12/17   Doles-Johnson,  Teah, NP  azithromycin (ZITHROMAX Z-PAK) 250 MG tablet Take 2 tablets (500 mg) on  Day 1,  followed by 1 tablet (250 mg) once daily on Days 2 through 5. 01/03/19   Darel Hong, MD  benzonatate (TESSALON) 200 MG capsule Take 1 capsule (200 mg total) by mouth every 6 (six) hours as needed for cough. 01/03/19 01/03/20  Darel Hong, MD  calcium acetate (PHOSLO) 667 MG capsule Take 2 capsules (1,334 mg total) by mouth 3 (three) times daily with meals. 09/27/18   Loletha Grayer, MD  chlorpheniramine-HYDROcodone (TUSSIONEX) 10-8 MG/5ML SUER Take 5 mLs by mouth every 12 (twelve) hours as needed for cough. Patient not taking: Reported on 12/20/2018 09/27/18   Loletha Grayer, MD  DULERA 200-5 MCG/ACT AERO Inhale 2 puffs into the lungs 2 (two) times daily. 01/16/18   [provider]  DULoxetine (CYMBALTA) 60 MG capsule Take 60 mg by mouth 2 (two) times daily.  12/05/17   [provider]  lidocaine-prilocaine (EMLA) cream Apply 1 application topically daily as needed [facility] access for dialysis).    [provider]  metoprolol tartrate (LOPRESSOR) 25 MG tablet Take 1 tablet (25 mg total) by mouth 2 (two) times daily. 06/12/17   Doles-Johnson, Teah, NP  multivitamin (RENA-VIT) TABS tablet Take 1 tablet by mouth daily. 08/23/17   [provider]  nicotine (NICODERM CQ - DOSED IN MG/24 HOURS) 21 mg/24hr patch One patch to skin daily (okay to substitute generic) Patient not taking: Reported on 12/20/2018 09/27/18   Loletha Grayer, MD  oxyCODONE (ROXICODONE) 5 MG immediate release tablet Take 1 tablet (5 mg total) by mouth every 6 (six) hours as needed for moderate pain or severe pain. Patient not taking: Reported on 12/20/2018 09/27/18   Loletha Grayer, MD  pantoprazole (PROTONIX) 40 MG tablet Take 1 tablet (40 mg total) by mouth daily. 12/20/18   Schuyler Amor, MD  predniSONE (DELTASONE) 50 MG tablet Take 1 tablet (50 mg total) by mouth daily for 4 days. 01/03/19 01/07/19   Darel Hong, MD  tiotropium (SPIRIVA) 18 MCG inhalation capsule Place 1 capsule (18 mcg total) into inhaler and inhale 2 (two) times daily. Patient not taking: Reported on 12/20/2018 06/12/17   Doles-Johnson, Teah, NP  torsemide (DEMADEX) 100 MG tablet Take 1 tablet (100 mg total) by mouth daily. 09/27/18   Loletha Grayer, MD  Vitamin D, Ergocalciferol, (DRISDOL) 50000 units CAPS capsule Take 50,000 Units by mouth every Sunday.  10/22/17   [provider]    Allergies Patient has no known allergies.  Family History  Problem Relation Age of Onset  . Diabetes Mother     Social History Social History   Tobacco Use  . Smoking status: Current Every Day Smoker    Packs/day: 0.50    Years: 30.00    Pack years: 15.00    Types: Cigarettes  . Smokeless tobacco: Never Used  Substance Use Topics  . Alcohol use: No  . Drug use: No    Review of Systems Constitutional: No fever/chills Eyes: No visual changes. ENT: Positive for sore throat. Cardiovascular:  Positive for chest pain. Respiratory: Positive for shortness of breath. Gastrointestinal: No abdominal pain.  No nausea, no vomiting.  No diarrhea.  No constipation. Genitourinary: Negative for dysuria. Musculoskeletal: Negative for back pain. Skin: Negative for rash. Neurological: Negative for headaches, focal weakness or numbness.   ____________________________________________   PHYSICAL EXAM:  VITAL SIGNS: ED Triage Vitals  Enc Vitals Group     BP      Pulse      Resp      Temp      Temp src      SpO2      Weight      Height      Head Circumference      Peak Flow      Pain Score      Pain Loc      Pain Edu?      Excl. in [place]?     Constitutional: Alert and oriented x4 appears obviously short of breath speaking in short sentences.  Using minor accessory muscles Eyes: PERRL EOMI. Head: Atraumatic. Nose: No congestion/rhinnorhea. Mouth/Throat: No trismus Neck: No stridor.  Parathyroidectomy  surgical site appears healthy with no evidence of infection.  Unable to quite lie completely flat and she does have significant JVD Cardiovascular: Tachycardic rate, regular rhythm. Grossly normal heart sounds.  Good peripheral circulation. Respiratory: Increased respiratory effort with expiratory wheezing in all fields and some rhonchi at the bases Gastrointestinal: Soft nontender Musculoskeletal: Graft to left upper extremity with good thrill.  Legs are equal in size although 2+ pitting edema to mid thigh bilaterally Neurologic:  Normal speech and language. No gross focal neurologic deficits are appreciated. Skin:  Skin is warm, dry and intact. No rash noted. Psychiatric: Somewhat anxious appearing  ____________________________________________   DIFFERENTIAL includes but not limited to  Acute pulmonary edema, reactive airway disease, COPD exacerbation, pneumonia, pneumothorax ____________________________________________   LABS (all labs ordered are listed, but only abnormal results are displayed)  Labs Reviewed  COMPREHENSIVE METABOLIC PANEL - Abnormal; Notable for the following components:      Result Value   Sodium 134 (*)    Potassium 3.2 (*)    Chloride 96 (*)    Glucose, Bld 134 (*)    BUN 24 (*)    Creatinine, Ser 4.08 (*)    Calcium 8.3 (*)    Albumin 3.0 (*)    ALT 56 (*)    Alkaline Phosphatase 152 (*)    GFR calc non Af Amer 12 (*)    GFR calc Af Amer 14 (*)    All other components within normal limits  BRAIN NATRIURETIC PEPTIDE - Abnormal; Notable for the following components:   B Natriuretic Peptide 1,028.0 (*)    All other components within normal limits  CBC WITH DIFFERENTIAL/PLATELET - Abnormal; Notable for the following components:   Hemoglobin 11.2 (*)    HCT 35.6 (*)    All other components within normal limits  BLOOD GAS, VENOUS - Abnormal; Notable for the following components:   pCO2, Ven 43 (*)    pO2, Ven 66.0 (*)    Acid-Base Excess 2.2 (*)     All other components within normal limits  MRSA PCR SCREENING  TROPONIN I  TSH  T4, FREE  PTH, INTACT AND CALCIUM  INFLUENZA PANEL BY PCR (TYPE A & B)  TROPONIN I  TROPONIN I  MAGNESIUM  PHOSPHORUS  CALCIUM, IONIZED  PREALBUMIN  HIV ANTIBODY (ROUTINE TESTING W REFLEX)    Lab work reviewed by  me shows the patient is influenza negative.  Elevated beta natruretic peptide is consistent with acute pulmonary edema and stretch.  No indication for emergent dialysis __________________________________________  EKG   ____________________________________________  RADIOLOGY  Chest x-ray reviewed by me consistent with acute pulmonary edema ____________________________________________   PROCEDURES  Procedure(s) performed: no  Procedures  Critical Care performed: no  ____________________________________________   INITIAL IMPRESSION / ASSESSMENT AND PLAN / ED COURSE  Pertinent labs & imaging results that were available during my care of the patient were reviewed by me and considered in my medical decision making (see chart for details).   As part of my medical decision making, I reviewed the following data within the [place] History obtained from family if available, nursing notes, old chart and ekg, as well as notes from prior ED visits.  The patient comes to the emergency department with progressive cough and shortness of breath.  She is tachycardic and tachypneic with obvious fluid overload.  She also has a history of reactive airway disease.  I will treat her with nasal cannula oxygen as well as 3 duo nebs and Solu-Medrol for her likely COPD exacerbation.  Oral azithromycin for the same.  The patient's chest x-ray shows fluid overload and her blood work with elevated BNP is consistent with the same.  She does feel improved after breathing treatments however I am concerned that despite persistent dialysis she remains clinically clearly fluid overloaded.  She  still makes urine so we will give her 80 mg of IV Lasix and as she does not have home oxygen and she is saturating in the mid to high 80s on room air she will require inpatient admission for continued oxygen supplementation as well as urgent dialysis and continued bronchodilators and steroids.  She has developed a gradual headache given her IV Tylenol along with Fioricet with good response.  Tessalon Perles seem to have helped her cough as well.  I then discussed with the hospitalist who has graciously agreed to admit the patient to his service.  The patient verbalizes understanding and agreement with the plan.      ____________________________________________   FINAL CLINICAL IMPRESSION(S) / ED DIAGNOSES  Final diagnoses:  COPD exacerbation ([place])  Acute pulmonary edema ([place])      NEW MEDICATIONS STARTED DURING THIS VISIT:  Current Discharge Medication List       Note:  This document was prepared using Dragon voice recognition software and may include unintentional dictation errors.    Darel Hong, MD 01/03/19 [phone]

## 2019-01-03 NOTE — H&P (Signed)
[facility] NAME: Janet Olson    MR#:  [id number]  DATE OF BIRTH:  1970-06-04  DATE OF ADMISSION:  01/03/2019  PRIMARY CARE PHYSICIAN: Remi Haggard, FNP   REQUESTING/REFERRING PHYSICIAN: Darel Hong, MD  CHIEF COMPLAINT:   Chief Complaint  Patient presents with  . Cough  . Shortness of Breath  . Leg Swelling    HISTORY OF PRESENT ILLNESS:  Clotile Olson  is a 49 y.o. female with a known history of obesity, HTN, ESRD (HD M/W/F via LUE AVF), COPD, chronic diastolic CHF (EF 24-40% w/ grade I diastolic dysfxn as of 10/27/2535 Echo) p/w acute on chronic hypoxemic respiratory failure, acute COPD exacerbation, volume overload, acute on chronic diastolic CHF exacerbation. Pt is AAOx3, but is tangential and a poor historian. She states she recently had parathyroid surgery, and has been in a perpetually volume overloaded state since that time. She states she had been receiving daily hemodialysis for the past two weeks. She states that her legs have been edematous and "tight" despite dialysis. She endorses cough and SOB at home. She states that cough has been productive of yellow sputum. On Friday evening, she states she developed severe coughing fits/spells and associated SOB/respiratory distress, prompting pt to ask her son to drive her to the hospital. Exam (+) diminished breath sound + diffuse wheezing + diffuse fine crackles. BNP 1028. CXR (+) "Chronic cardiomegaly. Increased interstitial opacities suspicious for pulmonary edema. No evidence of pneumonia." States she still makes urine.  PAST MEDICAL HISTORY:   Past Medical History:  Diagnosis Date  . Acute renal failure (ARF) ([place]) 05/17/2017  . Anemia   . Asthma   . Cardiomegaly   . Chronic kidney disease   . COPD (chronic obstructive pulmonary disease) ([place])   . Dialysis patient ([place])    MON, WED , FRI  . Dyspnea   . Enlarged heart   . GERD (gastroesophageal reflux  disease)   . Headache    Migraines  . Hypertension   . Nonrheumatic tricuspid (valve) insufficiency   . Ulcer   . Vitamin D deficiency     PAST SURGICAL HISTORY:   Past Surgical History:  Procedure Laterality Date  . A/V FISTULAGRAM Left 04/17/2018   Procedure: A/V FISTULAGRAM;  Surgeon: Algernon Huxley, MD;  Location: [place] [facility];  Service: Cardiovascular;  Laterality: Left;  . A/V FISTULAGRAM Left 05/27/2018   Procedure: A/V FISTULAGRAM;  Surgeon: Katha Cabal, MD;  Location: [place] [facility];  Service: Cardiovascular;  Laterality: Left;  . AV FISTULA PLACEMENT Left 08/08/2017   Procedure: ARTERIOVENOUS (AV) FISTULA CREATION ( BRACHIOCEPHALIC );  Surgeon: Algernon Huxley, MD;  Location: [facility];  Service: Vascular;  Laterality: Left;  . CESAREAN SECTION     x 4 (1988, 1991, 1997, 2000 )  . COLONOSCOPY WITH PROPOFOL N/A 04/24/2018   Procedure: COLONOSCOPY WITH PROPOFOL;  Surgeon: Lin Landsman, MD;  Location: [facility] ENDOSCOPY;  Service: Gastroenterology;  Laterality: N/A;  . DIALYSIS/PERMA CATHETER INSERTION N/A 07/16/2017   Procedure: Dialysis/Perma Catheter Insertion;  Surgeon: Katha Cabal, MD;  Location: [place] [facility];  Service: Cardiovascular;  Laterality: N/A;  . DIALYSIS/PERMA CATHETER REMOVAL N/A 10/29/2017   Procedure: DIALYSIS/PERMA CATHETER REMOVAL;  Surgeon: Katha Cabal, MD;  Location: [place] [facility];  Service: Cardiovascular;  Laterality: N/A;  . excision of lymph nodes Bilateral 2014   bilateral under arms.    SOCIAL HISTORY:  Social History   Tobacco Use  . Smoking status: Current Every Day Smoker    Packs/day: 0.50    Years: 30.00    Pack years: 15.00    Types: Cigarettes  . Smokeless tobacco: Never Used  Substance Use Topics  . Alcohol use: No    FAMILY HISTORY:   Family History  Problem Relation Age of Onset  . Diabetes Mother     DRUG ALLERGIES:  No Known Allergies  REVIEW OF SYSTEMS:   Review of  Systems  Constitutional: Negative for chills, diaphoresis, fever, malaise/fatigue and weight loss.  HENT: Negative for congestion, ear pain, hearing loss, nosebleeds, sinus pain, sore throat and tinnitus.   Eyes: Negative for blurred vision, double vision and photophobia.  Respiratory: Positive for cough, sputum production, shortness of breath and wheezing. Negative for hemoptysis.   Cardiovascular: Positive for leg swelling. Negative for chest pain, palpitations, orthopnea, claudication and PND.  Gastrointestinal: Negative for abdominal pain, blood in stool, constipation, diarrhea, heartburn, melena, nausea and vomiting.  Genitourinary: Negative for dysuria, flank pain, frequency, hematuria and urgency.  Musculoskeletal: Negative for back pain, falls, joint pain, myalgias and neck pain.  Skin: Negative for itching and rash.  Neurological: Negative for dizziness, tingling, tremors, sensory change, speech change, focal weakness, seizures, loss of consciousness, weakness and headaches.  Psychiatric/Behavioral: Negative for depression and memory loss. The patient is not nervous/anxious and does not have insomnia.    MEDICATIONS AT HOME:   Prior to Admission medications   Medication Sig Start Date End Date Taking? Authorizing Provider  amLODipine (NORVASC) 10 MG tablet Take 1 tablet (10 mg total) by mouth daily. 06/12/17   Doles-Johnson, Teah, NP  azithromycin (ZITHROMAX Z-PAK) 250 MG tablet Take 2 tablets (500 mg) on  Day 1,  followed by 1 tablet (250 mg) once daily on Days 2 through 5. 01/03/19   Darel Hong, MD  benzonatate (TESSALON) 200 MG capsule Take 1 capsule (200 mg total) by mouth every 6 (six) hours as needed for cough. 01/03/19 01/03/20  Darel Hong, MD  calcium acetate (PHOSLO) 667 MG capsule Take 2 capsules (1,334 mg total) by mouth 3 (three) times daily with meals. 09/27/18   Loletha Grayer, MD  chlorpheniramine-HYDROcodone (TUSSIONEX) 10-8 MG/5ML SUER Take 5 mLs by mouth every 12  (twelve) hours as needed for cough. Patient not taking: Reported on 12/20/2018 09/27/18   Loletha Grayer, MD  DULERA 200-5 MCG/ACT AERO Inhale 2 puffs into the lungs 2 (two) times daily. 01/16/18   [provider]  DULoxetine (CYMBALTA) 60 MG capsule Take 60 mg by mouth 2 (two) times daily.  12/05/17   [provider]  lidocaine-prilocaine (EMLA) cream Apply 1 application topically daily as needed [facility] access for dialysis).    [provider]  metoprolol tartrate (LOPRESSOR) 25 MG tablet Take 1 tablet (25 mg total) by mouth 2 (two) times daily. 06/12/17   Doles-Johnson, Teah, NP  multivitamin (RENA-VIT) TABS tablet Take 1 tablet by mouth daily. 08/23/17   [provider]  nicotine (NICODERM CQ - DOSED IN MG/24 HOURS) 21 mg/24hr patch One patch to skin daily (okay to substitute generic) Patient not taking: Reported on 12/20/2018 09/27/18   Loletha Grayer, MD  oxyCODONE (ROXICODONE) 5 MG immediate release tablet Take 1 tablet (5 mg total) by mouth every 6 (six) hours as needed for moderate pain or severe pain. Patient not taking: Reported on 12/20/2018 09/27/18   Loletha Grayer, MD  pantoprazole (PROTONIX) 40 MG tablet Take 1 tablet (40 mg  total) by mouth daily. 12/20/18   Schuyler Amor, MD  predniSONE (DELTASONE) 50 MG tablet Take 1 tablet (50 mg total) by mouth daily for 4 days. 01/03/19 01/07/19  Darel Hong, MD  tiotropium (SPIRIVA) 18 MCG inhalation capsule Place 1 capsule (18 mcg total) into inhaler and inhale 2 (two) times daily. Patient not taking: Reported on 12/20/2018 06/12/17   Doles-Johnson, Teah, NP  torsemide (DEMADEX) 100 MG tablet Take 1 tablet (100 mg total) by mouth daily. 09/27/18   Loletha Grayer, MD  Vitamin D, Ergocalciferol, (DRISDOL) 50000 units CAPS capsule Take 50,000 Units by mouth every Sunday.  10/22/17   [provider]      VITAL SIGNS:  Blood pressure 137/85, pulse 96, temperature (!) 97.4 F (36.3 C), temperature  source Oral, resp. rate (!) 23, height 5\' 3"  (1.6 m), weight 90.7 kg, last menstrual period 03/03/2018, SpO2 94 %.  PHYSICAL EXAMINATION:  Physical Exam Constitutional:      General: She is awake. She is in acute distress (+) mild respiratory distress.     Appearance: She is well-developed. She is obese. She is ill-appearing. She is not toxic-appearing or diaphoretic.     Interventions: She is not intubated.Nasal cannula in place.  HENT:     Head: Atraumatic.     Mouth/Throat:     Mouth: Mucous membranes are moist.  Eyes:     General: Lids are normal. No scleral icterus.    Extraocular Movements: Extraocular movements intact.     Conjunctiva/sclera: Conjunctivae normal.  Neck:     Musculoskeletal: Neck supple.     Thyroid: No thyromegaly.     Vascular: No JVD.  Cardiovascular:     Rate and Rhythm: Regular rhythm. Tachycardia present.  No extrasystoles are present.    Heart sounds: S1 normal and S2 normal. Heart sounds not distant. No murmur. No systolic murmur. No diastolic murmur. No friction rub. Gallop present. S4 sounds present. No S3 sounds.   Pulmonary:     Effort: Tachypnea and respiratory distress present. No bradypnea, accessory muscle usage, prolonged expiration or retractions. She is not intubated.     Breath sounds: Decreased air movement present. No stridor or transmitted upper airway sounds. Examination of the right-upper field reveals decreased breath sounds, wheezing and rales. Examination of the left-upper field reveals decreased breath sounds, wheezing and rales. Examination of the right-middle field reveals decreased breath sounds, wheezing and rales. Examination of the left-middle field reveals decreased breath sounds, wheezing and rales. Examination of the right-lower field reveals decreased breath sounds, wheezing and rales. Examination of the left-lower field reveals decreased breath sounds, wheezing and rales. Decreased breath sounds, wheezing and rales present. No  rhonchi.  Abdominal:     General: Bowel sounds are decreased. There is distension.     Palpations: Abdomen is soft.     Tenderness: There is no abdominal tenderness. There is no guarding or rebound.  Musculoskeletal: Normal range of motion.        General: No tenderness.     Right lower leg: 2+ Pitting Edema present.     Left lower leg: 2+ Pitting Edema present.  Lymphadenopathy:     Cervical: No cervical adenopathy.  Skin:    General: Skin is warm and dry.     Findings: No erythema or rash.  Neurological:     General: No focal deficit present.     Mental Status: She is alert and oriented to person, place, and time.  Psychiatric:  Attention and Perception: Attention and perception normal.        Mood and Affect: Mood and affect normal.        Speech: Speech is tangential.        Behavior: Behavior is cooperative.        Thought Content: Thought content normal.        Cognition and Memory: Cognition and memory normal.        Judgment: Judgment normal.    LABORATORY PANEL:   CBC Recent Labs  Lab 01/03/19 0243  WBC 9.9  HGB 11.2*  HCT 35.6*  PLT 249   ------------------------------------------------------------------------------------------------------------------  Chemistries  Recent Labs  Lab 01/03/19 0243  NA 134*  K 3.2*  CL 96*  CO2 27  GLUCOSE 134*  BUN 24*  CREATININE 4.08*  CALCIUM 8.3*  AST 31  ALT 56*  ALKPHOS 152*  BILITOT 0.4   ------------------------------------------------------------------------------------------------------------------  Cardiac Enzymes Recent Labs  Lab 01/03/19 0243  TROPONINI <0.03   ------------------------------------------------------------------------------------------------------------------  RADIOLOGY:  Dg Chest Port 1 View  Result Date: 01/03/2019 CLINICAL DATA:  Shortness of breath. History of end-stage renal disease and COPD. Productive cough and infectious symptoms. EXAM: PORTABLE CHEST 1 VIEW  COMPARISON:  Abdominal radiographs 12/20/2018 FINDINGS: Chronic cardiomegaly is unchanged. Increased interstitial opacities which appears similar to prior exam. No pleural effusion or confluent airspace disease. No pneumothorax. No acute osseous abnormalities. IMPRESSION: Chronic cardiomegaly. Increased interstitial opacities suspicious for pulmonary edema. No evidence of pneumonia. Electronically Signed   By: Keith Rake M.D.   On: 01/03/2019 03:17   IMPRESSION AND PLAN:   A/P: 29F w/ PMHx obesity, HTN, ESRD (HD M/W/F via LUE AVF), COPD, chronic diastolic CHF (EF 54-09% w/ grade I diastolic dysfxn as of 81/19/1478 Echo) p/w acute on chronic hypoxemic respiratory failure, acute COPD exacerbation, volume overload, acute on chronic diastolic CHF exacerbation. Hyponatremia, hypokalemia, hyperglycemia, hypocalcemia, hypoalbuminemia, transaminasemia, normocytic anemia. -Acute on chronic hypoxemic respiratory failure, acute COPD exacerbation, volume overload, acute on chronic diastolic CHF exacerbation, ESRD: SpO2 88% on room air, improved on [place]. p/w clinical volume overload state despite daily HD x2wks. Exam (+) diminished breath sound + diffuse wheezing + diffuse fine crackles, leg edema. Hyponatremia (free water excess), BNP elevation (1028), CXR (+) cardiomegaly + pulmonary edema. Prior Echo (05/18/2017) only notable for diastolic dysfxn, EF WNL. Rpt Echo pending. Daily weight, I&O, free water restricted diet. Edema likely worsened by hypoalbuminemia, decreased plasma oncotic pressure. Nutrition consult. Suspect liver dysfxn 2/2 passive congestion, NAFLD. Wheezing improved since arriving to ED; c/w steroids (IV, transition to PO), nebs. Mild COPD exacerbation, ABx not indicated (received Azithromycin in ED). Incentive spirometry, pulmonary toileting, cardiac monitoring, pulse oximetry, O2. Diuretic trial administered in ED, expect minimal response. Nephrology consult for HD. -Hyponatremia: Free water  excess. As above. -Hypokalemia: Replete. Mag pending. HD. -Hypocalcemia: Ionized calcium. -Hypoalbuminemia: Prealbumin. -Transaminasemia: Suspect liver dysfxn 2/2 passive congestion, NAFLD. LFTs < 2-3 ULN. -Normocytic anemia: Likely anemia of chronic (kidney) disease. No evidence of active/acute blood loss at present time. -c/w home meds/formulary subs. -FEN/GI: Renal free water restricted diet. -DVT PPx: Heparin. -Code status: Full code. -Disposition: Admission, > 2 midnights.   All the records are reviewed and case discussed with ED provider. Management plans discussed with the patient, family and they are in agreement.  CODE STATUS: Full code.  TOTAL TIME TAKING CARE OF THIS PATIENT: 75 minutes.    Arta Silence M.D on 01/03/2019 at 5:36 AM  Between 7am to 6pm - Pager - [phone]  After 6pm go to [email or web address] - Proofreader  [facility]  Office  [phone]  CC: Primary care physician; Remi Haggard, FNP   Note: This dictation was prepared with Dragon dictation along with smaller phrase technology. Any transcriptional errors that result from this process are unintentional.

## 2019-01-03 NOTE — Progress Notes (Signed)
Hd completed 

## 2019-01-03 NOTE — ED Notes (Signed)
Pt up out of bed, has removed monitoring equipment and oxygen. Pt informed she needs to remain on oxygen and monitoring equipment. Pt states "I had to go to the bathroom". Pt informed she needs to ring the call bell at her right side for assistance. Pt states "I did not want to bother you".

## 2019-01-03 NOTE — Plan of Care (Signed)
  Problem: Clinical Measurements: Goal: Respiratory complications will improve Outcome: Progressing   

## 2019-01-03 NOTE — Progress Notes (Signed)
[facility]  ROUNDING NOTE   Subjective:   [name] admitted to [facility] on 01/03/2019 for Acute pulmonary edema ([place]) [J81.0] COPD exacerbation ([place]) [J44.1]  Patient had hemodialysis treatment yesterday as outpatient. UF of 0.9L. Patient complains of shortness of breath, cough and peripheral edema.   Objective:  Vital signs in last 24 hours:  Temp:  [97.4 F (36.3 C)-98.3 F (36.8 C)] 97.6 F (36.4 C) (01/04 0759) Pulse Rate:  [95-100] 100 (01/04 0759) Resp:  [16-23] 16 (01/04 0759) BP: (137-151)/(85-103) 151/103 (01/04 0759) SpO2:  [87 %-100 %] 100 % (01/04 0759) Weight:  [90.7 kg-98 kg] 98 kg (01/04 0700)  Weight change:  Filed Weights   01/03/19 0241 01/03/19 0700  Weight: 90.7 kg 98 kg    Intake/Output: I/O last 3 completed shifts: In: 100 [IV Piggyback:100] Out: -    Intake/Output this shift:  Total I/O In: 123 [P.O.:120; I.V.:3] Out: -   Physical Exam: General: NAD,   Head: Normocephalic, atraumatic. Moist oral mucosal membranes  Eyes: Anicteric, PERRL  Neck: Supple, trachea midline  Lungs:  Clear to auscultation  Heart: Regular rate and rhythm  Abdomen:  Soft, nontender,   Extremities: ++ peripheral edema.  Neurologic: Nonfocal, moving all four extremities  Skin: No lesions  Access: Left AVF    Basic Metabolic Panel: Recent Labs  Lab 01/03/19 0243 01/03/19 0812  NA 134*  --   K 3.2*  --   CL 96*  --   CO2 27  --   GLUCOSE 134*  --   BUN 24*  --   CREATININE 4.08*  --   CALCIUM 8.3*  --   MG  --  1.7  PHOS  --  3.1    Liver Function Tests: Recent Labs  Lab 01/03/19 0243  AST 31  ALT 56*  ALKPHOS 152*  BILITOT 0.4  PROT 6.6  ALBUMIN 3.0*   No results for input(s): LIPASE, AMYLASE in the last 168 hours. No results for input(s): AMMONIA in the last 168 hours.  CBC: Recent Labs  Lab 01/03/19 0243  WBC 9.9  NEUTROABS 7.5  HGB 11.2*  HCT 35.6*  MCV 89.7  PLT 249    Cardiac Enzymes: Recent Labs   Lab 01/03/19 0243 01/03/19 0812 01/03/19 1139  TROPONINI <0.03 <0.03 <0.03    BNP: Invalid input(s): POCBNP  CBG: No results for input(s): GLUCAP in the last 168 hours.  Microbiology: Results for orders placed or performed during the hospital encounter of 01/03/19  MRSA PCR Screening     Status: None   Collection Time: 01/03/19  6:15 AM  Result Value Ref Range Status   MRSA by PCR NEGATIVE NEGATIVE Final    Comment:        The GeneXpert MRSA Assay (FDA approved for NASAL specimens only), is one component of a comprehensive MRSA colonization surveillance program. It is not intended to diagnose MRSA infection nor to guide or monitor treatment for MRSA infections. Performed at [facility], [address] [place], [place] [address]     Coagulation Studies: No results for input(s): LABPROT, INR in the last 72 hours.  Urinalysis: No results for input(s): COLORURINE, LABSPEC, PHURINE, GLUCOSEU, HGBUR, BILIRUBINUR, KETONESUR, PROTEINUR, UROBILINOGEN, NITRITE, LEUKOCYTESUR in the last 72 hours.  Invalid input(s): APPERANCEUR    Imaging: Dg Chest Port 1 View  Result Date: 01/03/2019 CLINICAL DATA:  Shortness of breath. History of end-stage renal disease and COPD. Productive cough and infectious symptoms. EXAM: PORTABLE CHEST 1  VIEW COMPARISON:  Abdominal radiographs 12/20/2018 FINDINGS: Chronic cardiomegaly is unchanged. Increased interstitial opacities which appears similar to prior exam. No pleural effusion or confluent airspace disease. No pneumothorax. No acute osseous abnormalities. IMPRESSION: Chronic cardiomegaly. Increased interstitial opacities suspicious for pulmonary edema. No evidence of pneumonia. Electronically Signed   By: Keith Rake M.D.   On: 01/03/2019 03:17     Medications:    . amLODipine  10 mg Oral Daily  . aspirin  81 mg Oral Daily  . calcium acetate  1,334 mg Oral TID WC  . DULoxetine  60 mg Oral BID  . heparin  5,000 Units  Subcutaneous Q8H  . ipratropium-albuterol  3 mL Nebulization Q6H  . methylPREDNISolone (SOLU-MEDROL) injection  60 mg Intravenous Q12H   Followed by  . [START ON 01/04/2019] predniSONE  40 mg Oral Q breakfast  . metoprolol tartrate  25 mg Oral BID  . mometasone-formoterol  2 puff Inhalation BID  . multivitamin  1 tablet Oral Daily  . pantoprazole  40 mg Oral Daily  . sodium chloride flush  10 mL Intravenous Q12H  . torsemide  100 mg Oral Daily   acetaminophen **OR** acetaminophen, bisacodyl, guaiFENesin-dextromethorphan, nitroGLYCERIN, ondansetron **OR** ondansetron (ZOFRAN) IV, oxyCODONE, senna-docusate  Assessment/ Plan:  [name] is a 49 y.o. black female with end stage renal disease on hemodialysis, hypertension, peptic ulcer disease, COPD, congestive heart failure, secondary hyperparathyroidism status post parathyroidectomy on 12/30 by Dr. Maudie Mercury at [facility]   Admitted with shortness of breath, peripheral edema and volume overload  [facility] MWF [address] 95.5kg L AVF  1. End Stage Renal Disease: on hemodialysis. Last hemodialysis treatment yesterday. However continues to have significant fluid overload. Admission weight is 3 kg over estimated dry weight.  - Extra hemodialysis treatment for today. UF goal of 3 liters. Orders prepared.   2. Hypertension: elevated diastolic. Volume driven.  - torsemide  3. Anemia of chronic kidney disease: hemoglobin 11.2. EPO on MWF dialysis treatments.   4. Secondary Hyperparathyroidism: status post parathyroidectomy. Calcium and phosphorus at goal.  Outpatient PTH at 482   LOS: 0 Janet Olson 1/4/202012:46 PM

## 2019-01-03 NOTE — Plan of Care (Signed)
  Problem: Education: Goal: Individualized Educational Video(s) Outcome: Progressing Note:  Pt has viewed all 4 EMMI videos, Living with Heart Failure booklet given to pt and reviewed, Fridge magnet given as well   Problem: Clinical Measurements: Goal: Diagnostic test results will improve Outcome: Not Progressing Note:  K 3.2, BUN 24/4.08 BNP 1028 Goal: Respiratory complications will improve Outcome: Not Progressing Note:  Remains on 2LO2 per [place], lungs diminished with fine crackles bil   Problem: Pain Managment: Goal: General experience of comfort will improve Outcome: Not Progressing Note:  PRN medications   Problem: Education: Goal: Knowledge of General Education information will improve Description Including pain rating scale, medication(s)/side effects and non-pharmacologic comfort measures Outcome: Progressing   Problem: Health Behavior/Discharge Planning: Goal: Ability to manage health-related needs will improve Outcome: Progressing   Problem: Clinical Measurements: Goal: Ability to maintain clinical measurements within normal limits will improve Outcome: Progressing Goal: Will remain free from infection Outcome: Progressing Note:  Remains afebrile   Problem: Education: Goal: Individualized Educational Video(s) Outcome: Progressing Note:  Pt has viewed all 4 EMMI videos, Living with Heart Failure booklet given to pt and reviewed, Fridge magnet given as well

## 2019-01-04 LAB — BASIC METABOLIC PANEL
Anion gap: 8 (ref 5–15)
BUN: 32 mg/dL — AB (ref 6–20)
CO2: 30 mmol/L (ref 22–32)
Calcium: 8.9 mg/dL (ref 8.9–10.3)
Chloride: 96 mmol/L — ABNORMAL LOW (ref 98–111)
Creatinine, Ser: 4.08 mg/dL — ABNORMAL HIGH (ref 0.44–1.00)
GFR calc Af Amer: 14 mL/min — ABNORMAL LOW (ref >60)
GFR calc non Af Amer: 12 mL/min — ABNORMAL LOW (ref >60)
GLUCOSE: 132 mg/dL — AB (ref 70–99)
Potassium: 4.5 mmol/L (ref 3.5–5.1)
Sodium: 134 mmol/L — ABNORMAL LOW (ref 135–145)

## 2019-01-04 LAB — INFLUENZA PANEL BY PCR (TYPE A & B)
INFLAPCR: NEGATIVE
Influenza B By PCR: NEGATIVE

## 2019-01-04 MED ORDER — PREDNISONE 20 MG PO TABS: 20.0000 mg | ORAL_TABLET | Freq: Every day | ORAL | 0 refills | Status: AC

## 2019-01-04 MED ORDER — BENZONATATE 200 MG PO CAPS: 200.0000 mg | ORAL_CAPSULE | Freq: Four times a day (QID) | ORAL | 0 refills | Status: DC | PRN

## 2019-01-04 NOTE — Progress Notes (Signed)
[facility]  ROUNDING NOTE   Subjective:   Hemodialysis treatment yesterday. Extra treatment. Tolerated treatment well. UF of 3 liters.   Objective:  Vital signs in last 24 hours:  Temp:  [97.7 F (36.5 C)-99.3 F (37.4 C)] 97.9 F (36.6 C) (01/05 0728) Pulse Rate:  [100-105] 100 (01/05 0728) Resp:  [14-24] 18 (01/05 0728) BP: (129-162)/(68-105) 136/105 (01/05 0728) SpO2:  [88 %-100 %] 88 % (01/05 0728) Weight:  [94.8 kg] 94.8 kg (01/05 0500)  Weight change: 4.037 kg Filed Weights   01/03/19 0241 01/03/19 0700 01/04/19 0500  Weight: 90.7 kg 98 kg 94.8 kg    Intake/Output: I/O last 3 completed shifts: In: 343 [P.O.:240; I.V.:3; IV Piggyback:100] Out: 3000 [Other:3000]   Intake/Output this shift:  Total I/O In: 933 [P.O.:933] Out: -   Physical Exam: General: NAD,   Head: Normocephalic, atraumatic. Moist oral mucosal membranes  Eyes: Anicteric, PERRL  Neck: Supple, trachea midline  Lungs:  Clear to auscultation  Heart: Regular rate and rhythm  Abdomen:  Soft, nontender,   Extremities: + peripheral edema.  Neurologic: Nonfocal, moving all four extremities  Skin: No lesions  Access: Left AVF    Basic Metabolic Panel: Recent Labs  Lab 01/03/19 0243 01/03/19 0812 01/04/19 0542  NA 134*  --  134*  K 3.2*  --  4.5  CL 96*  --  96*  CO2 27  --  30  GLUCOSE 134*  --  132*  BUN 24*  --  32*  CREATININE 4.08*  --  4.08*  CALCIUM 8.3*  --  8.9  MG  --  1.7  --   PHOS  --  3.1  --     Liver Function Tests: Recent Labs  Lab 01/03/19 0243  AST 31  ALT 56*  ALKPHOS 152*  BILITOT 0.4  PROT 6.6  ALBUMIN 3.0*   No results for input(s): LIPASE, AMYLASE in the last 168 hours. No results for input(s): AMMONIA in the last 168 hours.  CBC: Recent Labs  Lab 01/03/19 0243  WBC 9.9  NEUTROABS 7.5  HGB 11.2*  HCT 35.6*  MCV 89.7  PLT 249    Cardiac Enzymes: Recent Labs  Lab 01/03/19 0243 01/03/19 0812 01/03/19 1139  TROPONINI <0.03  <0.03 <0.03    BNP: Invalid input(s): POCBNP  CBG: No results for input(s): GLUCAP in the last 168 hours.  Microbiology: Results for orders placed or performed during the hospital encounter of 01/03/19  MRSA PCR Screening     Status: None   Collection Time: 01/03/19  6:15 AM  Result Value Ref Range Status   MRSA by PCR NEGATIVE NEGATIVE Final    Comment:        The GeneXpert MRSA Assay (FDA approved for NASAL specimens only), is one component of a comprehensive MRSA colonization surveillance program. It is not intended to diagnose MRSA infection nor to guide or monitor treatment for MRSA infections. Performed at [facility], [address] [place], [place] [address]     Coagulation Studies: No results for input(s): LABPROT, INR in the last 72 hours.  Urinalysis: No results for input(s): COLORURINE, LABSPEC, PHURINE, GLUCOSEU, HGBUR, BILIRUBINUR, KETONESUR, PROTEINUR, UROBILINOGEN, NITRITE, LEUKOCYTESUR in the last 72 hours.  Invalid input(s): APPERANCEUR    Imaging: Dg Chest Port 1 View  Result Date: 01/03/2019 CLINICAL DATA:  Shortness of breath. History of end-stage renal disease and COPD. Productive cough and infectious symptoms. EXAM: PORTABLE CHEST 1 VIEW COMPARISON:  Abdominal radiographs 12/20/2018 FINDINGS:  Chronic cardiomegaly is unchanged. Increased interstitial opacities which appears similar to prior exam. No pleural effusion or confluent airspace disease. No pneumothorax. No acute osseous abnormalities. IMPRESSION: Chronic cardiomegaly. Increased interstitial opacities suspicious for pulmonary edema. No evidence of pneumonia. Electronically Signed   By: Keith Rake M.D.   On: 01/03/2019 03:17     Medications:    . amLODipine  10 mg Oral Daily  . aspirin  81 mg Oral Daily  . calcium acetate  1,334 mg Oral TID WC  . DULoxetine  60 mg Oral BID  . heparin  5,000 Units Subcutaneous Q8H  . ipratropium-albuterol  3 mL Nebulization Q6H  .  metoprolol tartrate  25 mg Oral BID  . mometasone-formoterol  2 puff Inhalation BID  . multivitamin  1 tablet Oral Daily  . nicotine  14 mg Transdermal Daily  . pantoprazole  40 mg Oral Daily  . predniSONE  40 mg Oral Q breakfast  . sodium chloride flush  10 mL Intravenous Q12H  . torsemide  100 mg Oral Daily   acetaminophen **OR** acetaminophen, bisacodyl, guaiFENesin-dextromethorphan, nitroGLYCERIN, ondansetron **OR** ondansetron (ZOFRAN) IV, oxyCODONE, senna-docusate  Assessment/ Plan:  Janet Olson is a 49 y.o. black female with end stage renal disease on hemodialysis, hypertension, peptic ulcer disease, COPD, congestive heart failure, secondary hyperparathyroidism status post parathyroidectomy on 12/29/18 by Dr. Maudie Mercury at [facility]   Admitted with shortness of breath, peripheral edema and volume overload  [facility] MWF [address] 95.5kg L AVF  1. End Stage Renal Disease: on hemodialysis. Required an extra hemodialysis treatment yesterday for volume overload. UF of 3 liters.  Next hemodialysis treatment for tomorrow. Continues to have significatn peripheral edema. Goal UF should again to be 3 liters or greater  2. Hypertension: elevated diastolic. Volume driven.  - torsemide  3. Anemia of chronic kidney disease: hemoglobin 11.2. EPO on MWF dialysis treatments.   4. Secondary Hyperparathyroidism: status post parathyroidectomy on 12/29/18 at [facility], Dr. Maudie Mercury. Calcium and phosphorus at goal.  Outpatient PTH at 482   LOS: 1 Maxx [place] 1/5/20201:35 PM

## 2019-01-04 NOTE — Plan of Care (Signed)

## 2019-01-04 NOTE — Plan of Care (Signed)
  Problem: Activity: Goal: Risk for activity intolerance will decrease Outcome: Progressing   Problem: Education: Goal: Individualized Educational Video(s) Outcome: Progressing   Problem: Activity: Goal: Capacity to carry out activities will improve Outcome: Progressing

## 2019-01-04 NOTE — Progress Notes (Signed)
Pt discharged to home with sons, IV removed, follow up appointment information provided with instructions, and prescriptions. VSS, NAD, no concerns or complaints at this time.

## 2019-01-04 NOTE — Discharge Instructions (Signed)
Please take your steroids as prescribed for the next 4 days and take your antibiotics for the next 5 days.  You do have a little bit too much fluid so continue your aggressive dialysis the way you have been doing.  Return to the emergency department sooner for any concerns.  It was a pleasure to take care of you today, and thank you for coming to our emergency department.  If you have any questions or concerns before leaving please ask the nurse to grab me and I'm more than happy to go through your aftercare instructions again.  If you were prescribed any opioid pain medication today such as Norco, Vicodin, Percocet, morphine, hydrocodone, or oxycodone please make sure you do not drive when you are taking this medication as it can alter your ability to drive safely.  If you have any concerns once you are home that you are not improving or are in fact getting worse before you can make it to your follow-up appointment, please do not hesitate to call 911 and come back for further evaluation.  Darel Hong, MD  Results for orders placed or performed during the hospital encounter of 01/03/19  Comprehensive metabolic panel  Result Value Ref Range   Sodium 134 (L) 135 - 145 mmol/L   Potassium 3.2 (L) 3.5 - 5.1 mmol/L   Chloride 96 (L) 98 - 111 mmol/L   CO2 27 22 - 32 mmol/L   Glucose, Bld 134 (H) 70 - 99 mg/dL   BUN 24 (H) 6 - 20 mg/dL   Creatinine, Ser 4.08 (H) 0.44 - 1.00 mg/dL   Calcium 8.3 (L) 8.9 - 10.3 mg/dL   Total Protein 6.6 6.5 - 8.1 g/dL   Albumin 3.0 (L) 3.5 - 5.0 g/dL   AST 31 15 - 41 U/L   ALT 56 (H) 0 - 44 U/L   Alkaline Phosphatase 152 (H) 38 - 126 U/L   Total Bilirubin 0.4 0.3 - 1.2 mg/dL   GFR calc non Af Amer 12 (L) >60 mL/min   GFR calc Af Amer 14 (L) >60 mL/min   Anion gap 11 5 - 15  Troponin I - Once  Result Value Ref Range   Troponin I <0.03 <0.03 ng/mL  Brain natriuretic peptide  Result Value Ref Range   B Natriuretic Peptide 1,028.0 (H) 0.0 - 100.0 pg/mL  CBC  with Differential  Result Value Ref Range   WBC 9.9 4.0 - 10.5 K/uL   RBC 3.97 3.87 - 5.11 MIL/uL   Hemoglobin 11.2 (L) 12.0 - 15.0 g/dL   HCT 35.6 (L) 36.0 - 46.0 %   MCV 89.7 80.0 - 100.0 fL   MCH 28.2 26.0 - 34.0 pg   MCHC 31.5 30.0 - 36.0 g/dL   RDW 15.1 11.5 - 15.5 %   Platelets 249 150 - 400 K/uL   nRBC 0.0 0.0 - 0.2 %   Neutrophils Relative % 76 %   Neutro Abs 7.5 1.7 - 7.7 K/uL   Lymphocytes Relative 16 %   Lymphs Abs 1.5 0.7 - 4.0 K/uL   Monocytes Relative 6 %   Monocytes Absolute 0.6 0.1 - 1.0 K/uL   Eosinophils Relative 2 %   Eosinophils Absolute 0.2 0.0 - 0.5 K/uL   Basophils Relative 0 %   Basophils Absolute 0.0 0.0 - 0.1 K/uL   Immature Granulocytes 0 %   Abs Immature Granulocytes 0.04 0.00 - 0.07 K/uL  Blood gas, venous  Result Value Ref Range   pH, Lawson Fiscal  7.41 7.250 - 7.430   pCO2, Ven 43 (L) 44.0 - 60.0 mmHg   pO2, Ven 66.0 (H) 32.0 - 45.0 mmHg   Bicarbonate 27.3 20.0 - 28.0 mmol/L   Acid-Base Excess 2.2 (H) 0.0 - 2.0 mmol/L   O2 Saturation 92.9 %   Patient temperature 37.0    Collection site VENOUS    Sample type VENOUS   TSH  Result Value Ref Range   TSH 0.531 0.350 - 4.500 uIU/mL  T4, free  Result Value Ref Range   Free T4 1.20 0.82 - 1.77 ng/dL   Ct Abdomen Pelvis Wo Contrast  Result Date: 12/20/2018 CLINICAL DATA:  Epigastric pain EXAM: CT ABDOMEN AND PELVIS WITHOUT CONTRAST TECHNIQUE: Multidetector CT imaging of the abdomen and pelvis was performed following the standard protocol without IV contrast. COMPARISON:  12/20/2018, CT 03/21/2017, ultrasound 02/22/2018 FINDINGS: Lower chest: Lung bases demonstrate no pleural effusion. Moderate cardiomegaly with right pericardial effusion measuring up to 3 cm in thickness. Ground-glass density within the bilateral lung bases. Hepatobiliary: The liver is enlarged measuring 24 cm in craniocaudad length. No focal hepatic abnormality. No calcified gallstone, however there is gallbladder wall thickening or small  amount of pericholecystic fluid. Pancreas: Unremarkable. No pancreatic ductal dilatation or surrounding inflammatory changes. Spleen: Normal in size without focal abnormality. Adrenals/Urinary Tract: Adrenal glands are normal. No hydronephrosis. The bladder is unremarkable Stomach/Bowel: The stomach is nonenlarged. No dilated small bowel. No colon wall thickening. Negative appendix. Sigmoid colon diverticula without acute inflammatory process Vascular/Lymphatic: No significantly enlarged lymph nodes. Mild aortic atherosclerosis. No aneurysm. Reproductive: Uterus and bilateral adnexa are unremarkable. Other: No free air. Small free fluid in the pelvis. Diffuse skin thickening and subcutaneous edema consistent with anasarca Musculoskeletal: No fracture. Development lucencies within the L2 vertebral body that appear contiguous with the endplate, possible developing Schmorl's node IMPRESSION: 1. No CT evidence for bowel obstruction or bowel wall thickening 2. Gallbladder wall thickening versus small amount of pericholecystic fluid. Consider correlation with ultrasound 3. Cardiomegaly with pericardial effusion measuring up to 3 cm in thickness. Ground-glass density at both bases suspicious for pulmonary edema. Generalized skin thickening and subcutaneous edema consistent with anasarca 4. Hepatomegaly Electronically Signed   By: Donavan Foil M.D.   On: 12/20/2018 18:51   Dg Chest Port 1 View  Result Date: 01/03/2019 CLINICAL DATA:  Shortness of breath. History of end-stage renal disease and COPD. Productive cough and infectious symptoms. EXAM: PORTABLE CHEST 1 VIEW COMPARISON:  Abdominal radiographs 12/20/2018 FINDINGS: Chronic cardiomegaly is unchanged. Increased interstitial opacities which appears similar to prior exam. No pleural effusion or confluent airspace disease. No pneumothorax. No acute osseous abnormalities. IMPRESSION: Chronic cardiomegaly. Increased interstitial opacities suspicious for pulmonary edema.  No evidence of pneumonia. Electronically Signed   By: Keith Rake M.D.   On: 01/03/2019 03:17   Dg Abdomen Acute W/chest  Result Date: 12/20/2018 CLINICAL DATA:  Nausea and bloating. EXAM: DG ABDOMEN ACUTE W/ 1V CHEST COMPARISON:  Chest x-ray 09/27/2018. FINDINGS: Marked enlargement of the cardiopericardial silhouette, stable. Pulmonary vascular congestion is similar to prior. No overt edema or pleural effusion. Upright film shows no evidence for intraperitoneal free air. Supine film shows diffuse gaseous small bowel distention without overt obstructive pattern. Visualized bony anatomy unremarkable. IMPRESSION: 1. Cardiomegaly without acute cardiopulmonary findings. 2. No intraperitoneal free air. 3. Diffuse gaseous distention of small bowel without an overtly obstructive pattern. Ileus or gastroenteritis would be a consideration. Electronically Signed   By: Misty Stanley M.D.   On: 12/20/2018 17:24  Smoking cessation.

## 2019-01-04 NOTE — Discharge Summary (Signed)
[place] [facility] [place] NAME: Janet Olson    MR#:  [id number]  DATE OF BIRTH:  [birth year]  DATE OF ADMISSION:  01/03/2019   ADMITTING PHYSICIAN: Janet Silence, MD  DATE OF DISCHARGE: 01/04/2019  PRIMARY CARE PHYSICIAN: Janet Haggard, FNP   ADMISSION DIAGNOSIS:  Acute pulmonary edema ([place]) [J81.0] COPD exacerbation ([place]) [J44.1] DISCHARGE DIAGNOSIS:  Active Problems:   Acute hypoxemic respiratory failure ([place])  SECONDARY DIAGNOSIS:   Past Medical History:  Diagnosis Date  . Acute renal failure (ARF) ([place]) 05/17/2017  . Anemia   . Asthma   . Cardiomegaly   . Chronic kidney disease   . COPD (chronic obstructive pulmonary disease) ([place])   . Dialysis patient ([place])    MON, WED , FRI  . Dyspnea   . Enlarged heart   . GERD (gastroesophageal reflux disease)   . Headache    Migraines  . Hypertension   . Nonrheumatic tricuspid (valve) insufficiency   . Ulcer   . Vitamin D deficiency    HOSPITAL COURSE:  The patient has no shortness of breath and much better eg edema.   Acute on chronic respiratory failure with hypoxia due to COPD exacerbation and fluid overload and acute on chronic diastolic CHF. The patient has been treated with hemodialysis and torsemide.  Symptoms has much improved.  Hypokalemia.  Improved. ESRD on hemodialysis.  The patient got hemodialysis with a lot of fluid withdrawal. Obesity.  Encourage diet control and follow-up PCP. Tobacco abuse, smoking cessation was counseled for 3 to 4 minutes, nicotine patch. Discussed with RN and Dr. Juleen Olson. DISCHARGE CONDITIONS:  Stable, discharge to home today. CONSULTS OBTAINED:  Treatment Team:  Janet Silence, MD Janet Dana, MD DRUG ALLERGIES:  No Known Allergies DISCHARGE MEDICATIONS:   Allergies as of 01/04/2019   No Known Allergies     Medication List    STOP taking these medications   azithromycin 250 MG tablet Commonly known as:   ZITHROMAX     TAKE these medications   amLODipine 10 MG tablet Commonly known as:  NORVASC Take 1 tablet (10 mg total) by mouth daily.   benzonatate 200 MG capsule Commonly known as:  TESSALON Take 1 capsule (200 mg total) by mouth every 6 (six) hours as needed for cough. What changed:    medication strength  how much to take  when to take this  reasons to take this   calcium acetate 667 MG capsule Commonly known as:  PHOSLO Take 2 capsules (1,334 mg total) by mouth 3 (three) times daily with meals.   chlorpheniramine-HYDROcodone 10-8 MG/5ML Suer Commonly known as:  TUSSIONEX Take 5 mLs by mouth every 12 (twelve) hours as needed for cough.   DULERA 200-5 MCG/ACT Aero Generic drug:  mometasone-formoterol Inhale 2 puffs into the lungs 2 (two) times daily.   DULoxetine 60 MG capsule Commonly known as:  CYMBALTA Take 60 mg by mouth 2 (two) times daily.   lidocaine-prilocaine cream Commonly known as:  EMLA Apply 1 application topically daily as needed [facility] access for dialysis).   metoprolol tartrate 25 MG tablet Commonly known as:  LOPRESSOR Take 1 tablet (25 mg total) by mouth 2 (two) times daily.   multivitamin Tabs tablet Take 1 tablet by mouth daily.   nicotine 21 mg/24hr patch Commonly known as:  NICODERM CQ - dosed in mg/24 hours One patch to skin daily (okay to substitute generic)   oxyCODONE 5 MG immediate release tablet Commonly known  as:  ROXICODONE Take 1 tablet (5 mg total) by mouth every 6 (six) hours as needed for moderate pain or severe pain.   pantoprazole 40 MG tablet Commonly known as:  PROTONIX Take 1 tablet (40 mg total) by mouth daily.   predniSONE 20 MG tablet Commonly known as:  DELTASONE Take 1 tablet (20 mg total) by mouth daily for 4 days. What changed:    how much to take  how to take this  when to take this  additional instructions   tiotropium 18 MCG inhalation capsule Commonly known as:  SPIRIVA Place 1 capsule (18  mcg total) into inhaler and inhale 2 (two) times daily.   torsemide 100 MG tablet Commonly known as:  DEMADEX Take 1 tablet (100 mg total) by mouth daily.   Vitamin D (Ergocalciferol) 1.25 MG (50000 UT) Caps capsule Commonly known as:  DRISDOL Take 50,000 Units by mouth every Sunday.        DISCHARGE INSTRUCTIONS:  See AVS.  If you experience worsening of your admission symptoms, develop shortness of breath, life threatening emergency, suicidal or homicidal thoughts you must seek medical attention immediately by calling 911 or calling your MD immediately  if symptoms less severe.  You Must read complete instructions/literature along with all the possible adverse reactions/side effects for all the Medicines you take and that have been prescribed to you. Take any new Medicines after you have completely understood and accpet all the possible adverse reactions/side effects.   Please note  You were cared for by a hospitalist during your hospital stay. If you have any questions about your discharge medications or the care you received while you were in the hospital after you are discharged, you can call the unit and asked to speak with the hospitalist on call if the hospitalist that took care of you is not available. Once you are discharged, your primary care physician will handle any further medical issues. Please note that NO REFILLS for any discharge medications will be authorized once you are discharged, as it is imperative that you return to your primary care physician (or establish a relationship with a primary care physician if you do not have one) for your aftercare needs so that they can reassess your need for medications and monitor your lab values.    On the day of Discharge:  VITAL SIGNS:  Blood pressure (!) 136/105, pulse 100, temperature 97.9 F (36.6 C), temperature source Oral, resp. rate 18, height 5\' 3"  (1.6 m), weight 94.8 kg, last menstrual period 03/03/2018, SpO2 (!) 88  %. PHYSICAL EXAMINATION:  GENERAL:  49 y.o.-year-old patient lying in the bed with no acute distress.  EYES: Pupils equal, round, reactive to light and accommodation. No scleral icterus. Extraocular muscles intact.  HEENT: Head atraumatic, normocephalic. Oropharynx and nasopharynx clear.  NECK:  Supple, no jugular venous distention. No thyroid enlargement, no tenderness.  LUNGS: Normal breath sounds bilaterally, no wheezing, rales,rhonchi or crepitation. No use of accessory muscles of respiration.  CARDIOVASCULAR: S1, S2 normal. No murmurs, rubs, or gallops.  ABDOMEN: Soft, non-tender, non-distended. Bowel sounds present. No organomegaly or mass.  EXTREMITIES: No pedal edema, cyanosis, or clubbing.  NEUROLOGIC: Cranial nerves II through XII are intact. Muscle strength 5/5 in all extremities. Sensation intact. Gait not checked.  PSYCHIATRIC: The patient is alert and oriented x 3.  SKIN: No obvious rash, lesion, or ulcer.  DATA REVIEW:   CBC Recent Labs  Lab 01/03/19 0243  WBC 9.9  HGB 11.2*  HCT 35.6*  PLT 249    Chemistries  Recent Labs  Lab 01/03/19 0243 01/03/19 0812 01/04/19 0542  NA 134*  --  134*  K 3.2*  --  4.5  CL 96*  --  96*  CO2 27  --  30  GLUCOSE 134*  --  132*  BUN 24*  --  32*  CREATININE 4.08*  --  4.08*  CALCIUM 8.3*  --  8.9  MG  --  1.7  --   AST 31  --   --   ALT 56*  --   --   ALKPHOS 152*  --   --   BILITOT 0.4  --   --      Microbiology Results  Results for orders placed or performed during the hospital encounter of 01/03/19  MRSA PCR Screening     Status: None   Collection Time: 01/03/19  6:15 AM  Result Value Ref Range Status   MRSA by PCR NEGATIVE NEGATIVE Final    Comment:        The GeneXpert MRSA Assay (FDA approved for NASAL specimens only), is one component of a comprehensive MRSA colonization surveillance program. It is not intended to diagnose MRSA infection nor to guide or monitor treatment for MRSA infections. Performed  at [facility], [address], [place], [place] [address]     RADIOLOGY:  No results found.   Management plans discussed with the patient, family and they are in agreement.  CODE STATUS: Full Code   TOTAL TIME TAKING CARE OF THIS PATIENT: 32 minutes.    Demetrios Loll M.D on 01/04/2019 at 12:49 PM  Between 7am to 6pm - Pager - [phone]  After 6pm go to [email or web address] - Proofreader  [facility]  Office  [phone]  CC: Primary care physician; Janet Haggard, FNP   Note: This dictation was prepared with Dragon dictation along with smaller phrase technology. Any transcriptional errors that result from this process are unintentional.

## 2019-01-05 LAB — HIV ANTIBODY (ROUTINE TESTING W REFLEX): HIV Screen 4th Generation wRfx: NONREACTIVE

## 2019-01-05 LAB — PTH, INTACT AND CALCIUM
Calcium, Total (PTH): 8.4 mg/dL — ABNORMAL LOW (ref 8.7–10.2)
PTH: 73 pg/mL — ABNORMAL HIGH (ref 15–65)

## 2019-01-05 LAB — CALCIUM, IONIZED: Calcium, Ionized, Serum: 4.5 mg/dL (ref 4.5–5.6)

## 2019-01-15 ENCOUNTER — Encounter (INDEPENDENT_AMBULATORY_CARE_PROVIDER_SITE_OTHER): Payer: Medicaid Other

## 2019-01-15 ENCOUNTER — Ambulatory Visit (INDEPENDENT_AMBULATORY_CARE_PROVIDER_SITE_OTHER): Payer: Medicaid Other | Admitting: Nurse Practitioner

## 2019-01-26 DIAGNOSIS — I3139 Other pericardial effusion (noninflammatory): Secondary | ICD-10-CM

## 2019-01-26 DIAGNOSIS — R0602 Shortness of breath: Secondary | ICD-10-CM | POA: Insufficient documentation

## 2019-01-26 HISTORY — DX: Other pericardial effusion (noninflammatory): I31.39

## 2019-02-02 ENCOUNTER — Emergency Department
Admission: EM | Admit: 2019-02-02 | Discharge: 2019-02-02 | Disposition: A | Discharge status: Home / Self Care | Payer: Medicaid Other | Attending: Emergency Medicine | Admitting: Emergency Medicine

## 2019-02-02 ENCOUNTER — Emergency Department: Payer: Medicaid Other

## 2019-02-02 ENCOUNTER — Other Ambulatory Visit: Payer: Self-pay

## 2019-02-02 DIAGNOSIS — N186 End stage renal disease: Secondary | ICD-10-CM | POA: Insufficient documentation

## 2019-02-02 DIAGNOSIS — J449 Chronic obstructive pulmonary disease, unspecified: Secondary | ICD-10-CM | POA: Insufficient documentation

## 2019-02-02 DIAGNOSIS — R509 Fever, unspecified: Secondary | ICD-10-CM | POA: Diagnosis present

## 2019-02-02 DIAGNOSIS — Z79899 Other long term (current) drug therapy: Secondary | ICD-10-CM | POA: Insufficient documentation

## 2019-02-02 DIAGNOSIS — K1379 Other lesions of oral mucosa: Secondary | ICD-10-CM | POA: Insufficient documentation

## 2019-02-02 DIAGNOSIS — R05 Cough: Secondary | ICD-10-CM | POA: Insufficient documentation

## 2019-02-02 DIAGNOSIS — I120 Hypertensive chronic kidney disease with stage 5 chronic kidney disease or end stage renal disease: Secondary | ICD-10-CM | POA: Insufficient documentation

## 2019-02-02 DIAGNOSIS — Z992 Dependence on renal dialysis: Secondary | ICD-10-CM | POA: Insufficient documentation

## 2019-02-02 DIAGNOSIS — N189 Chronic kidney disease, unspecified: Secondary | ICD-10-CM

## 2019-02-02 DIAGNOSIS — F17210 Nicotine dependence, cigarettes, uncomplicated: Secondary | ICD-10-CM | POA: Diagnosis not present

## 2019-02-02 LAB — CBC WITH DIFFERENTIAL/PLATELET
Abs Immature Granulocytes: 0.02 10*3/uL (ref 0.00–0.07)
Basophils Absolute: 0 10*3/uL (ref 0.0–0.1)
Basophils Relative: 0 %
EOS ABS: 0.1 10*3/uL (ref 0.0–0.5)
Eosinophils Relative: 1 %
HCT: 34.7 % — ABNORMAL LOW (ref 36.0–46.0)
Hemoglobin: 10.9 g/dL — ABNORMAL LOW (ref 12.0–15.0)
Immature Granulocytes: 0 %
Lymphocytes Relative: 16 %
Lymphs Abs: 1.2 10*3/uL (ref 0.7–4.0)
MCH: 27.3 pg (ref 26.0–34.0)
MCHC: 31.4 g/dL (ref 30.0–36.0)
MCV: 86.8 fL (ref 80.0–100.0)
Monocytes Absolute: 0.6 10*3/uL (ref 0.1–1.0)
Monocytes Relative: 8 %
Neutro Abs: 5.5 10*3/uL (ref 1.7–7.7)
Neutrophils Relative %: 75 %
Platelets: 239 10*3/uL (ref 150–400)
RBC: 4 MIL/uL (ref 3.87–5.11)
RDW: 16.4 % — ABNORMAL HIGH (ref 11.5–15.5)
WBC: 7.4 10*3/uL (ref 4.0–10.5)
nRBC: 0 % (ref 0.0–0.2)

## 2019-02-02 LAB — COMPREHENSIVE METABOLIC PANEL
ALT: 27 U/L (ref 0–44)
AST: 20 U/L (ref 15–41)
Albumin: 3.2 g/dL — ABNORMAL LOW (ref 3.5–5.0)
Alkaline Phosphatase: 117 U/L (ref 38–126)
Anion gap: 10 (ref 5–15)
BUN: 49 mg/dL — ABNORMAL HIGH (ref 6–20)
CALCIUM: 7.7 mg/dL — AB (ref 8.9–10.3)
CO2: 28 mmol/L (ref 22–32)
Chloride: 101 mmol/L (ref 98–111)
Creatinine, Ser: 6.98 mg/dL — ABNORMAL HIGH (ref 0.44–1.00)
GFR calc Af Amer: 7 mL/min — ABNORMAL LOW (ref >60)
GFR calc non Af Amer: 6 mL/min — ABNORMAL LOW (ref >60)
Glucose, Bld: 95 mg/dL (ref 70–99)
Potassium: 3.8 mmol/L (ref 3.5–5.1)
Sodium: 139 mmol/L (ref 135–145)
Total Bilirubin: 0.7 mg/dL (ref 0.3–1.2)
Total Protein: 6.4 g/dL — ABNORMAL LOW (ref 6.5–8.1)

## 2019-02-02 LAB — INFLUENZA PANEL BY PCR (TYPE A & B)
Influenza A By PCR: NEGATIVE
Influenza B By PCR: NEGATIVE

## 2019-02-02 LAB — GROUP A STREP BY PCR: Group A Strep by PCR: NOT DETECTED

## 2019-02-02 MED ORDER — MAGIC MOUTHWASH W/LIDOCAINE: 5.0000 mL | Freq: Four times a day (QID) | ORAL | 0 refills | Status: DC | PRN

## 2019-02-02 MED ORDER — LIDOCAINE VISCOUS HCL 2 % MT SOLN
15.0000 mL | Freq: Once | OROMUCOSAL | Status: AC
  Administered 2019-02-02: 15 mL via OROMUCOSAL
  Filled 2019-02-02: qty 15

## 2019-02-02 MED ORDER — BENZONATATE 200 MG PO CAPS: 200.0000 mg | ORAL_CAPSULE | Freq: Four times a day (QID) | ORAL | 0 refills | Status: AC | PRN

## 2019-02-02 MED ORDER — CALCIUM CARBONATE ANTACID 500 MG PO CHEW
2.0000 | CHEWABLE_TABLET | Freq: Once | ORAL | Status: AC
  Administered 2019-02-02: 400 mg via ORAL
  Filled 2019-02-02: qty 2

## 2019-02-02 NOTE — ED Triage Notes (Signed)
Pt states sore throat and burning when she swallows. Voice sounds hoarse. States symptoms x 2 weeks. No fevers. A&O, ambulatory. Chewing gum on arrival. Throat appears red and swollen.

## 2019-02-02 NOTE — ED Triage Notes (Signed)
First Nurse Note:  Arrives c/o sore throat.  Voice clear and strong.  Managing secretions well.  NAD.  Respirations regular and non labored.

## 2019-02-02 NOTE — ED Notes (Signed)
See triage note  Presents with sore throat   States she developed sore throat about 2 weeks ago  Pain increases with swallowing

## 2019-02-02 NOTE — ED Provider Notes (Signed)
[facility] Emergency Department Provider Note  ____________________________________________   First MD Initiated Contact with Patient 02/02/19 1849     (approximate)  I have reviewed the triage vital signs and the nursing notes.   HISTORY  Chief Complaint Sore Throat    HPI Janet Olson is a 49 y.o. female presents emergency department complaining of fever, sore throat, her tongue is burning.  She states her sore throat started about 2 weeks ago.  She recently had her parathyroid removed on 12/29/2018.  She states her calcium had gotten really high but that should be improving.  She denies any chest pain or shortness of breath but states that she does have a really bad cough.    Past Medical History:  Diagnosis Date  . Acute renal failure (ARF) ([place]) 05/17/2017  . Anemia   . Asthma   . Cardiomegaly   . Chronic kidney disease   . COPD (chronic obstructive pulmonary disease) ([place])   . Dialysis patient ([place])    MON, WED , FRI  . Dyspnea   . Enlarged heart   . GERD (gastroesophageal reflux disease)   . Headache    Migraines  . Hypertension   . Nonrheumatic tricuspid (valve) insufficiency   . Ulcer   . Vitamin D deficiency     Patient Active Problem List   Diagnosis Date Noted  . Acute hypoxemic respiratory failure ([place]) 01/03/2019  . Acute respiratory failure ([place]) 09/25/2018  . Renal dialysis device, implant, or graft complication 30/16/0109  . Colon cancer screening   . Respiratory failure with hypoxia ([place]) 04/21/2018  . ESRD on dialysis ([place]) 08/02/2017  . COPD (chronic obstructive pulmonary disease) ([place]) 06/20/2017  . Anemia in chronic kidney disease 06/07/2017  . 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 Mass of left axilla 12/07/2013    Past Surgical History:  Procedure Laterality Date  . A/V FISTULAGRAM Left 04/17/2018   Procedure: A/V FISTULAGRAM;  Surgeon: Algernon Huxley, MD;  Location: [place] [facility];   Service: Cardiovascular;  Laterality: Left;  . A/V FISTULAGRAM Left 05/27/2018   Procedure: A/V FISTULAGRAM;  Surgeon: Katha Cabal, MD;  Location: [place] [facility];  Service: Cardiovascular;  Laterality: Left;  . AV FISTULA PLACEMENT Left 08/08/2017   Procedure: ARTERIOVENOUS (AV) FISTULA CREATION ( BRACHIOCEPHALIC );  Surgeon: Algernon Huxley, MD;  Location: [facility] ORS;  Service: Vascular;  Laterality: Left;  . CESAREAN SECTION     x 4 (1988, 1991, 1997, 2000 )  . COLONOSCOPY WITH PROPOFOL N/A 04/24/2018   Procedure: COLONOSCOPY WITH PROPOFOL;  Surgeon: Lin Landsman, MD;  Location: [facility] ENDOSCOPY;  Service: Gastroenterology;  Laterality: N/A;  . DIALYSIS/PERMA CATHETER INSERTION N/A 07/16/2017   Procedure: Dialysis/Perma Catheter Insertion;  Surgeon: Katha Cabal, MD;  Location: [place] [facility];  Service: Cardiovascular;  Laterality: N/A;  . DIALYSIS/PERMA CATHETER REMOVAL N/A 10/29/2017   Procedure: DIALYSIS/PERMA CATHETER REMOVAL;  Surgeon: Katha Cabal, MD;  Location: [place] [facility];  Service: Cardiovascular;  Laterality: N/A;  . excision of lymph nodes Bilateral 2014   bilateral under arms.  Marland Kitchen PARATHYROID IMPLANT REMOVAL      Prior to Admission medications   Medication Sig Start Date End Date Taking? Authorizing Provider  amLODipine (NORVASC) 10 MG tablet Take 1 tablet (10 mg total) by mouth daily. 06/12/17   Doles-Johnson, Teah, NP  benzonatate (TESSALON) 200 MG capsule Take 1 capsule (200 mg total) by mouth every 6 (six) hours as needed  for cough. 02/02/19 02/02/20  Versie Starks, PA-C  calcium acetate (PHOSLO) 667 MG capsule Take 2 capsules (1,334 mg total) by mouth 3 (three) times daily with meals. 09/27/18   Wieting, Richard, MD  DULERA 200-5 MCG/ACT AERO Inhale 2 puffs into the lungs 2 (two) times daily. 01/16/18   [provider]  DULoxetine (CYMBALTA) 60 MG capsule Take 60 mg by mouth 2 (two) times daily.  12/05/17   [provider]    lidocaine-prilocaine (EMLA) cream Apply 1 application topically daily as needed [facility] access for dialysis).    [provider]  magic mouthwash w/lidocaine SOLN Take 5 mLs by mouth 4 (four) times daily as needed for mouth pain. 02/02/19   Caryn Section Linden Dolin, PA-C  metoprolol tartrate (LOPRESSOR) 25 MG tablet Take 1 tablet (25 mg total) by mouth 2 (two) times daily. 06/12/17   Doles-Johnson, Teah, NP  multivitamin (RENA-VIT) TABS tablet Take 1 tablet by mouth daily. 08/23/17   [provider]  pantoprazole (PROTONIX) 40 MG tablet Take 1 tablet (40 mg total) by mouth daily. 12/20/18   Schuyler Amor, MD  torsemide (DEMADEX) 100 MG tablet Take 1 tablet (100 mg total) by mouth daily. 09/27/18   Loletha Grayer, MD  Vitamin D, Ergocalciferol, (DRISDOL) 50000 units CAPS capsule Take 50,000 Units by mouth every Sunday.  10/22/17   [provider]    Allergies Patient has no known allergies.  Family History  Problem Relation Age of Onset  . Diabetes Mother     Social History Social History   Tobacco Use  . Smoking status: Current Every Day Smoker    Packs/day: 0.50    Years: 30.00    Pack years: 15.00    Types: Cigarettes  . Smokeless tobacco: Never Used  Substance Use Topics  . Alcohol use: No  . Drug use: No    Review of Systems  Constitutional: Positive fever/chills Eyes: No visual changes. ENT: Positive sore throat.  Positive for tongue burning and pain Respiratory: Positive cough Genitourinary: Negative for dysuria. Musculoskeletal: Negative for back pain. Skin: Negative for rash.    ____________________________________________   PHYSICAL EXAM:  VITAL SIGNS: ED Triage Vitals  Enc Vitals Group     BP 02/02/19 1832 (!) 164/88     Pulse Rate 02/02/19 1832 95     Resp 02/02/19 1832 18     Temp 02/02/19 1832 99.4 F (37.4 C)     Temp Source 02/02/19 1832 Oral     SpO2 02/02/19 1832 93 %     Weight 02/02/19 1831 200 lb (90.7 kg)     Height  02/02/19 1831 5\' 6"  (1.676 m)     Head Circumference --      Peak Flow --      Pain Score 02/02/19 1831 10     Pain Loc --      Pain Edu? --      Excl. in [place]? --     Constitutional: Alert and oriented. Well appearing and in no acute distress. Eyes: Conjunctivae are normal.  Head: Atraumatic. Nose: No congestion/rhinnorhea. Mouth/Throat: Mucous membranes are moist.  Throat is minimally red, tongue is coated with white but is not removable, no ulcerations are noted Neck:  supple no lymphadenopathy noted Cardiovascular: Normal rate, regular rhythm. Heart sounds are normal Respiratory: Normal respiratory effort.  No retractions, lungs c t a cough is wet GU: deferred Musculoskeletal: FROM all extremities, warm and well perfused Neurologic:  Normal speech and language.  Skin:  Skin is warm, dry and intact. No rash noted. Psychiatric: Mood and affect are normal. Speech and behavior are normal.  ____________________________________________   LABS (all labs ordered are listed, but only abnormal results are displayed)  Labs Reviewed  COMPREHENSIVE METABOLIC PANEL - Abnormal; Notable for the following components:      Result Value   BUN 49 (*)    Creatinine, Ser 6.98 (*)    Calcium 7.7 (*)    Total Protein 6.4 (*)    Albumin 3.2 (*)    GFR calc non Af Amer 6 (*)    GFR calc Af Amer 7 (*)    All other components within normal limits  CBC WITH DIFFERENTIAL/PLATELET - Abnormal; Notable for the following components:   Hemoglobin 10.9 (*)    HCT 34.7 (*)    RDW 16.4 (*)    All other components within normal limits  GROUP A STREP BY PCR  INFLUENZA PANEL BY PCR (TYPE A & B)   ____________________________________________   ____________________________________________  RADIOLOGY  Chest x-ray is negative for pneumonia, radiologist says it is improved from her last chest x-ray for CHF  ____________________________________________   PROCEDURES  Procedure(s) performed:  No  Procedures    ____________________________________________   INITIAL IMPRESSION / ASSESSMENT AND PLAN / ED COURSE  Pertinent labs & imaging results that were available during my care of the patient were reviewed by me and considered in my medical decision making (see chart for details).   Patient is 49 year old female presents emergency department complaining of a cough, fever, body aches, and stinging over the mouth.  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  She states they did remove the parathyroid.  Patient is a dialysis patient.  She gets dialysis on Monday Wednesday Friday  Physical exam shows a low-grade temp, lungs are clear to auscultation but she has a congested cough, heart rate is normal remainder the exam is unremarkable  Fluids a test is negative, strep test is negative, CBC has decreased hemoglobin hematocrit, comprehensive metabolic panel shows worsening renal functions with her creatinine at 6.98 and BUN at 49 which is a change from her previous labs few months ago The chest x-ray shows improvement of her CHF  Explained all the test results to Dr. Archie Balboa.  We discussed the patient's case.  He states that since she is a dialysis patient will be okay for her to go home.  Give her some calcium while she is here.  She is to follow-up with Dr. Zollie Scale.  Conveyed all of this to the patient.  She states she understands will call Dr. Zollie Scale in the morning.  She is to also follow-up with her regular doctor.  She states she understands will comply.  She is to return if worsening.  She was discharged in stable condition.     As part of my medical decision making, I reviewed the following data within the [facility] notes reviewed and incorporated, Labs reviewed see above, Old chart reviewed, Radiograph reviewed chest x-ray is improved from previous no pneumonia, Notes from prior ED visits and [place] Controlled Substance  Database  ____________________________________________   FINAL CLINICAL IMPRESSION(S) / ED DIAGNOSES  Final diagnoses:  Hypocalcemia  Chronic renal failure, unspecified CKD stage  Mouth pain      NEW MEDICATIONS STARTED DURING THIS VISIT:  Discharge Medication List as of 02/02/2019  9:52 PM    START taking these medications   Details  magic mouthwash w/lidocaine SOLN Take 5 mLs  by mouth 4 (four) times daily as needed for mouth pain., Starting Mon 02/02/2019, Print         Note:  This document was prepared using Dragon voice recognition software and may include unintentional dictation errors.    Versie Starks, PA-C 02/02/19 2323    Schuyler Amor, MD 02/02/19 [phone]

## 2019-02-02 NOTE — Discharge Instructions (Addendum)
Follow-up with your regular doctor and call your kidney specialist.  Take extra calcium by eating 2 Tums 3 times a day.  Follow-up with dialysis for your regular appointment on Wednesday.  Return if worsening.

## 2019-04-28 DIAGNOSIS — Z720 Tobacco use: Secondary | ICD-10-CM | POA: Insufficient documentation

## 2019-04-28 DIAGNOSIS — I361 Nonrheumatic tricuspid (valve) insufficiency: Secondary | ICD-10-CM | POA: Insufficient documentation

## 2019-04-28 DIAGNOSIS — I5032 Chronic diastolic (congestive) heart failure: Secondary | ICD-10-CM | POA: Insufficient documentation

## 2019-10-12 ENCOUNTER — Other Ambulatory Visit: Payer: Self-pay | Admitting: Nephrology

## 2019-10-12 DIAGNOSIS — N186 End stage renal disease: Secondary | ICD-10-CM

## 2019-11-09 ENCOUNTER — Other Ambulatory Visit: Payer: Self-pay | Admitting: Nephrology

## 2019-11-09 DIAGNOSIS — N186 End stage renal disease: Secondary | ICD-10-CM

## 2019-11-09 DIAGNOSIS — K7460 Unspecified cirrhosis of liver: Secondary | ICD-10-CM

## 2019-11-09 DIAGNOSIS — R140 Abdominal distension (gaseous): Secondary | ICD-10-CM

## 2019-11-10 ENCOUNTER — Ambulatory Visit: Admission: RE | Admit: 2019-11-10 | Payer: Medicaid Other | Source: Ambulatory Visit

## 2019-11-12 ENCOUNTER — Ambulatory Visit
Admission: RE | Admit: 2019-11-12 | Discharge: 2019-11-12 | Disposition: A | Discharge status: Home / Self Care | Payer: Medicaid Other | Source: Ambulatory Visit | Attending: Nephrology | Admitting: Nephrology

## 2019-11-12 ENCOUNTER — Other Ambulatory Visit: Payer: Self-pay

## 2019-11-12 DIAGNOSIS — K7460 Unspecified cirrhosis of liver: Secondary | ICD-10-CM | POA: Insufficient documentation

## 2019-11-12 DIAGNOSIS — N186 End stage renal disease: Secondary | ICD-10-CM | POA: Diagnosis present

## 2019-11-12 DIAGNOSIS — R140 Abdominal distension (gaseous): Secondary | ICD-10-CM | POA: Insufficient documentation

## 2019-11-30 ENCOUNTER — Other Ambulatory Visit (INDEPENDENT_AMBULATORY_CARE_PROVIDER_SITE_OTHER): Payer: Self-pay | Admitting: Vascular Surgery

## 2019-11-30 DIAGNOSIS — T82838A Hemorrhage of vascular prosthetic devices, implants and grafts, initial encounter: Secondary | ICD-10-CM

## 2019-11-30 DIAGNOSIS — T829XXS Unspecified complication of cardiac and vascular prosthetic device, implant and graft, sequela: Secondary | ICD-10-CM

## 2019-12-01 ENCOUNTER — Ambulatory Visit (INDEPENDENT_AMBULATORY_CARE_PROVIDER_SITE_OTHER): Payer: Medicaid Other

## 2019-12-01 ENCOUNTER — Other Ambulatory Visit: Payer: Self-pay

## 2019-12-01 ENCOUNTER — Ambulatory Visit (INDEPENDENT_AMBULATORY_CARE_PROVIDER_SITE_OTHER): Payer: Medicaid Other | Admitting: Nurse Practitioner

## 2019-12-01 DIAGNOSIS — T829XXS Unspecified complication of cardiac and vascular prosthetic device, implant and graft, sequela: Secondary | ICD-10-CM

## 2019-12-11 ENCOUNTER — Encounter (INDEPENDENT_AMBULATORY_CARE_PROVIDER_SITE_OTHER): Payer: Self-pay | Admitting: Vascular Surgery

## 2019-12-15 ENCOUNTER — Encounter: Payer: Self-pay | Admitting: Nephrology

## 2020-01-06 ENCOUNTER — Encounter: Payer: Self-pay | Admitting: Gastroenterology

## 2020-01-06 ENCOUNTER — Ambulatory Visit (INDEPENDENT_AMBULATORY_CARE_PROVIDER_SITE_OTHER): Payer: Medicare Other | Admitting: Gastroenterology

## 2020-01-06 ENCOUNTER — Other Ambulatory Visit: Payer: Self-pay

## 2020-01-06 VITALS — BP 145/86 | HR 73 | Temp 98.0°F | Resp 17 | Ht 66.0 in | Wt 187.4 lb

## 2020-01-06 DIAGNOSIS — K760 Fatty (change of) liver, not elsewhere classified: Secondary | ICD-10-CM

## 2020-01-06 DIAGNOSIS — R188 Other ascites: Secondary | ICD-10-CM

## 2020-01-06 NOTE — Patient Instructions (Signed)

## 2020-01-06 NOTE — Progress Notes (Signed)
Cephas Darby, MD [address]  [place]  [place], [place] [address]  Main: [phone]  Fax: [phone]    Gastroenterology Consultation  Referring Provider:     Remi Haggard, FNP Primary Care Physician:  Remi Haggard, FNP Primary Gastroenterologist:  Dr. Cephas Darby Reason for Consultation:     Ascites, fatty liver        HPI:   Janet Olson is a 50 y.o. female referred by Dr. Dewitt Hoes for consultation & management of ascites, fatty liver.  Patient has end-stage renal disease, currently on hemodialysis who has been noticing several months of gradually worsening abdominal distention, puffiness of her eyes, shortness of breath, swelling of legs.  She underwent ultrasound of her abdomen by Dr. Stefani Dama and was found to have moderate ascites as well as fatty liver, patent portal vein, no splenomegaly.  Therefore, she is referred to GI for further evaluation.  Patient reports that she still makes urine and currently taking low-dose furosemide in addition to undergoing hemodialysis.  She does report severe constipation associated with scant amount of bright red blood per rectum.  She has Linzess 145 MCG samples which she just started taking.  Her main concern today is abdominal discomfort from distention that has resulted in decreased p.o. intake, unable to lay flat and lower abdominal pain.  She does acknowledge eating chips regularly.  She does smoke cigarettes regularly.  She denies fever, chills, nausea or vomiting, melena.  Due to ongoing discomfort, patient went to [facility] [place] on 1/3.  She underwent CBC which was unremarkable, hemoglobin 12, platelets 232, WBC 6.4, LFTs revealed albumin 4, AST 25, ALT 22, alkaline phosphatase 216.  Her BNP was significantly elevated she underwent CT abdomen and pelvis without contrast which revealed diffuse body wall edema and large volume ascites as well as mild umbilical hernia.  Chest x-ray revealed cardiomegaly  and diffuse bilateral interstitial infiltrates.  She does not have a cardiologist.  CT did not reveal cirrhosis or splenomegaly or portal hypertension  Echocardiogram in 04/2017 revealed EF of 55 to 73%, grade 1 diastolic dysfunction, mild pericardial effusion   NSAIDs: none  Antiplts/Anticoagulants/Anti thrombotics: none  GI Procedures: Colonoscopy for rectal bleeding, 04/24/2018 - The examined portion of the ileum was normal. - Three diminutive polyps in the rectum and in the cecum, removed with a cold biopsy forceps. Resected and retrieved. - Moderate diverticulosis in the sigmoid colon. There was no evidence of diverticular bleeding. - Internal hemorrhoids. DIAGNOSIS:  A. COLON POLYP, CECUM; COLD BIOPSY:  - COLONIC MUCOSA WITH PROMINENT LYMPHOID AGGREGATE.  - NEGATIVE FOR DYSPLASIA AND MALIGNANCY.   B. RECTUM POLYP; COLD BIOPSY:  - EARLY HYPERPLASTIC POLYP.  - NEGATIVE FOR DYSPLASIA AND MALIGNANCY.   She denies family history of GI malignancy in first-degree or second-degree relatives She did not have any GI surgeries  Past Medical History:  Diagnosis Date  . Acute renal failure (ARF) ([place]) 05/17/2017  . Anemia   . Asthma   . Cardiomegaly   . Chronic kidney disease   . COPD (chronic obstructive pulmonary disease) ([place])   . Dialysis patient ([place])    MON, WED , FRI  . Dyspnea   . Enlarged heart   . GERD (gastroesophageal reflux disease)   . Headache    Migraines  . Hypertension   . Nonrheumatic tricuspid (valve) insufficiency   . Ulcer   . Vitamin D deficiency     Past Surgical History:  Procedure Laterality Date  .  A/V FISTULAGRAM Left 04/17/2018   Procedure: A/V FISTULAGRAM;  Surgeon: Algernon Huxley, MD;  Location: [place] [facility];  Service: Cardiovascular;  Laterality: Left;  . A/V FISTULAGRAM Left 05/27/2018   Procedure: A/V FISTULAGRAM;  Surgeon: Katha Cabal, MD;  Location: [place] [facility];  Service: Cardiovascular;  Laterality: Left;  . AV  FISTULA PLACEMENT Left 08/08/2017   Procedure: ARTERIOVENOUS (AV) FISTULA CREATION ( BRACHIOCEPHALIC );  Surgeon: Algernon Huxley, MD;  Location: [facility] ORS;  Service: Vascular;  Laterality: Left;  . CESAREAN SECTION     x 4 (1988, 1991, 1997, 2000 )  . COLONOSCOPY WITH PROPOFOL N/A 04/24/2018   Procedure: COLONOSCOPY WITH PROPOFOL;  Surgeon: Lin Landsman, MD;  Location: [facility] ENDOSCOPY;  Service: Gastroenterology;  Laterality: N/A;  . DIALYSIS/PERMA CATHETER INSERTION N/A 07/16/2017   Procedure: Dialysis/Perma Catheter Insertion;  Surgeon: Katha Cabal, MD;  Location: [place] [facility];  Service: Cardiovascular;  Laterality: N/A;  . DIALYSIS/PERMA CATHETER REMOVAL N/A 10/29/2017   Procedure: DIALYSIS/PERMA CATHETER REMOVAL;  Surgeon: Katha Cabal, MD;  Location: [place] [facility] LAB;  Service: Cardiovascular;  Laterality: N/A;  . excision of lymph nodes Bilateral 2014   bilateral under arms.  Marland Kitchen PARATHYROID IMPLANT REMOVAL       Current Outpatient Medications:  .  albuterol (PROVENTIL) (2.5 MG/3ML) 0.083% nebulizer solution, Inhale into the lungs., Disp: , Rfl:  .  albuterol (VENTOLIN HFA) 108 (90 Base) MCG/ACT inhaler, Inhale into the lungs., Disp: , Rfl:  .  albuterol (VENTOLIN HFA) 108 (90 Base) MCG/ACT inhaler, SMARTSIG:2 Puff(s) By Mouth Every 4-6 Hours PRN, Disp: , Rfl:  .  amLODipine (NORVASC) 10 MG tablet, Take 1 tablet (10 mg total) by mouth daily., Disp: 30 tablet, Rfl: 3 .  clotrimazole (MYCELEX) 10 MG troche, TK 1 LOZENGE 5 TIMES D FOR 7 DAYS, Disp: , Rfl:  .  docusate sodium (COLACE) 100 MG capsule, Take by mouth., Disp: , Rfl:  .  DULERA 200-5 MCG/ACT AERO, Inhale 2 puffs into the lungs 2 (two) times daily., Disp: , Rfl: 0 .  DULoxetine (CYMBALTA) 60 MG capsule, Take 60 mg by mouth 2 (two) times daily. , Disp: , Rfl: 0 .  famotidine (PEPCID) 20 MG tablet, Take 20 mg by mouth 2 (two) times daily., Disp: , Rfl:  .  furosemide (LASIX) 40 MG tablet, Take by mouth.,  Disp: , Rfl:  .  hydrOXYzine (ATARAX/VISTARIL) 25 MG tablet, Take 25 mg by mouth daily., Disp: , Rfl:  .  lidocaine-prilocaine (EMLA) cream, Apply 1 application topically daily as needed [facility] access for dialysis)., Disp: , Rfl:  .  Melatonin 10 MG CAPS, Take by mouth., Disp: , Rfl:  .  metoprolol tartrate (LOPRESSOR) 25 MG tablet, Take 1 tablet (25 mg total) by mouth 2 (two) times daily., Disp: 60 tablet, Rfl: 3 .  oxyCODONE (OXY IR/ROXICODONE) 5 MG immediate release tablet, Take by mouth., Disp: , Rfl:  .  pantoprazole (PROTONIX) 40 MG tablet, Take 1 tablet (40 mg total) by mouth daily., Disp: 30 tablet, Rfl: 0 .  ramelteon (ROZEREM) 8 MG tablet, Take by mouth., Disp: , Rfl:  .  torsemide (DEMADEX) 100 MG tablet, Take 1 tablet (100 mg total) by mouth daily., Disp: 30 tablet, Rfl: 0 .  triamcinolone cream (KENALOG) 0.5 %, Apply topically., Disp: , Rfl:  .  Vitamin D, Ergocalciferol, (DRISDOL) 50000 units CAPS capsule, Take 50,000 Units by mouth every Sunday. , Disp: , Rfl: 0 .  zolpidem (AMBIEN)  5 MG tablet, Take by mouth., Disp: , Rfl:  .  benzonatate (TESSALON) 100 MG capsule, Take 100 mg by mouth 2 (two) times daily as needed., Disp: , Rfl:  .  benzonatate (TESSALON) 200 MG capsule, Take 1 capsule (200 mg total) by mouth every 6 (six) hours as needed for cough. (Patient not taking: Reported on 01/06/2020), Disp: 30 capsule, Rfl: 0 .  budesonide-formoterol (SYMBICORT) 160-4.5 MCG/ACT inhaler, Inhale into the lungs., Disp: , Rfl:  .  calcium acetate (PHOSLO) 667 MG capsule, Take 2 capsules (1,334 mg total) by mouth 3 (three) times daily with meals. (Patient not taking: Reported on 01/06/2020), Disp: 180 capsule, Rfl: 0 .  chlorpheniramine-HYDROcodone (TUSSIONEX) 10-8 MG/5ML SUER, Take by mouth., Disp: , Rfl:  .  fluconazole (DIFLUCAN) 100 MG tablet, TK 1 T PO D, Disp: , Rfl:  .  hydrOXYzine (VISTARIL) 25 MG capsule, Take by mouth., Disp: , Rfl:  .  ibuprofen (ADVIL) 600 MG tablet, Take by mouth.,  Disp: , Rfl:  .  magic mouthwash w/lidocaine SOLN, Take 5 mLs by mouth 4 (four) times daily as needed for mouth pain. (Patient not taking: Reported on 01/06/2020), Disp: 150 mL, Rfl: 0 .  metoprolol tartrate (LOPRESSOR) 50 MG tablet, Take 50 mg by mouth 2 (two) times daily., Disp: , Rfl:  .  multivitamin (RENA-VIT) TABS tablet, Take 1 tablet by mouth daily., Disp: , Rfl: 1 .  nicotine (NICODERM CQ - DOSED IN MG/24 HR) 7 mg/24hr patch, Place onto the skin., Disp: , Rfl:  .  sucralfate (CARAFATE) 1 g tablet, Take by mouth., Disp: , Rfl:  .  tiotropium (SPIRIVA) 18 MCG inhalation capsule, Place into inhaler and inhale., Disp: , Rfl:    Family History  Problem Relation Age of Onset  . Diabetes Mother      Social History   Tobacco Use  . Smoking status: Current Every Day Smoker    Packs/day: 0.50    Years: 30.00    Pack years: 15.00    Types: Cigarettes  . Smokeless tobacco: Never Used  Substance Use Topics  . Alcohol use: No  . Drug use: No    Allergies as of 01/06/2020  . (No Known Allergies)    Review of Systems:    All systems reviewed and negative except where noted in HPI.   Physical Exam:  BP (!) 145/86 (BP Location: Left Arm, Patient Position: Sitting, Cuff Size: Large)   Pulse 73   Temp 98 F (36.7 C)   Resp 17   Ht 5\' 6"  (1.676 m)   Wt 187 lb 6.4 oz (85 kg)   BMI 30.25 kg/m  No LMP recorded. Patient is perimenopausal.  General:   Alert,  Well-developed, well-nourished, pleasant and cooperative in NAD Head:  Normocephalic and atraumatic. Eyes:  Sclera clear, no icterus.   Conjunctiva pink. Ears:  Normal auditory acuity. Nose:  No deformity, discharge, or lesions. Mouth:  No deformity or lesions,oropharynx pink & moist. Neck:  Supple; no masses or thyromegaly. Lungs:  Respirations even and unlabored.  Clear throughout to auscultation.   No wheezes, crackles, or rhonchi. No acute distress. Heart:  Regular rate and rhythm; no murmurs, clicks, rubs, or  gallops. Abdomen:  Normal bowel sounds. Soft, moderately distended, dull to percussion in bilateral flanks, non-tender without masses, hepatosplenomegaly or hernias noted.  No guarding or rebound tenderness.   Rectal: Not performed Msk:  Symmetrical without gross deformities. Good, equal movement & strength bilaterally. Pulses:  Normal pulses noted. Extremities:  No clubbing, 1+ edema.  No cyanosis, left upper arm AV fistula. Neurologic:  Alert and oriented x3;  grossly normal neurologically. Skin:  Intact without significant lesions or rashes. No jaundice. Psych:  Alert and cooperative. Normal mood and affect.  Imaging Studies: reviewed  Assessment and Plan:   Janet Olson is a 50 y.o. African-American female with end stage renal disease secondary to hypertension on hemodialysis presents for follow-up of new onset of ascites and fatty liver detected on imaging  Ascites: Most likely combination of end-stage renal failure as well as CHF Both CT and ultrasound as well as labs are not consistent with cirrhosis of liver Recommend paracentesis with fluid analysis to determine whether or not ascites is secondary to portal hypertension Discussed with her about low-sodium diet Continue low-dose diuretics  Volume overload Chest x-ray revealed bilateral interstitial infiltrates, cardiomegaly 2D echo in 2018 revealed grade 1 diastolic dysfunction with mild pericardial effusion Referral to cardiology placed  Follow up in 1 month   Cephas Darby, MD

## 2020-01-11 ENCOUNTER — Ambulatory Visit: Payer: Medicare Other

## 2020-01-11 ENCOUNTER — Telehealth: Payer: Self-pay | Admitting: Gastroenterology

## 2020-01-11 NOTE — Telephone Encounter (Signed)
Pt has been notified of location for paracentesis and verbalized understanding

## 2020-01-11 NOTE — Telephone Encounter (Signed)
Pt is calling to check where her Paracentesis apt  Is located at please call pt

## 2020-01-12 ENCOUNTER — Other Ambulatory Visit: Payer: Self-pay

## 2020-01-12 ENCOUNTER — Ambulatory Visit
Admission: RE | Admit: 2020-01-12 | Discharge: 2020-01-12 | Disposition: A | Discharge status: Home / Self Care | Payer: Medicare Other | Source: Ambulatory Visit | Attending: Gastroenterology | Admitting: Gastroenterology

## 2020-01-12 DIAGNOSIS — R188 Other ascites: Secondary | ICD-10-CM | POA: Diagnosis present

## 2020-01-12 LAB — BODY FLUID CELL COUNT WITH DIFFERENTIAL
Eos, Fluid: 0 %
Lymphs, Fluid: 38 %
Monocyte-Macrophage-Serous Fluid: 60 %
Neutrophil Count, Fluid: 2 %
Total Nucleated Cell Count, Fluid: 256 cu mm

## 2020-01-12 LAB — ALBUMIN, PLEURAL OR PERITONEAL FLUID: Albumin, Fluid: 2.4 g/dL

## 2020-01-12 LAB — PROTEIN, PLEURAL OR PERITONEAL FLUID: Total protein, fluid: 4.9 g/dL

## 2020-01-14 ENCOUNTER — Ambulatory Visit (INDEPENDENT_AMBULATORY_CARE_PROVIDER_SITE_OTHER): Payer: Medicare Other | Admitting: Nurse Practitioner

## 2020-01-14 ENCOUNTER — Other Ambulatory Visit: Payer: Self-pay

## 2020-01-14 ENCOUNTER — Encounter: Payer: Self-pay | Admitting: Nurse Practitioner

## 2020-01-14 VITALS — BP 150/90 | HR 80 | Ht 66.0 in | Wt 176.2 lb

## 2020-01-14 DIAGNOSIS — Z720 Tobacco use: Secondary | ICD-10-CM

## 2020-01-14 DIAGNOSIS — I10 Essential (primary) hypertension: Secondary | ICD-10-CM | POA: Diagnosis not present

## 2020-01-14 DIAGNOSIS — I5033 Acute on chronic diastolic (congestive) heart failure: Secondary | ICD-10-CM

## 2020-01-14 DIAGNOSIS — N186 End stage renal disease: Secondary | ICD-10-CM

## 2020-01-14 DIAGNOSIS — I272 Pulmonary hypertension, unspecified: Secondary | ICD-10-CM | POA: Diagnosis not present

## 2020-01-14 DIAGNOSIS — R188 Other ascites: Secondary | ICD-10-CM

## 2020-01-14 LAB — CYTOLOGY - NON PAP

## 2020-01-14 MED ORDER — CARVEDILOL 12.5 MG PO TABS: 12.5000 mg | ORAL_TABLET | Freq: Two times a day (BID) | ORAL | 3 refills | Status: DC

## 2020-01-14 NOTE — Patient Instructions (Signed)
Medication Instructions:  1- STOP Lasix 2- STOP all metoprolol 3- START Carvedilol  Take 1 tablet (12.5 mg total) by mouth 2 (two) times daily *If you need a refill on your cardiac medications before your next appointment, please call your pharmacy*  Lab Work: None ordered  If you have labs (blood work) drawn today and your tests are completely normal, you will receive your results only by: Marland Kitchen MyChart Message (if you have MyChart) OR . A paper copy in the mail If you have any lab test that is abnormal or we need to change your treatment, we will call you to review the results.  Testing/Procedures: 1- Ref to Pulmonology for sleep study  2- Echo  Please return to [facility] on ______________ at _______________ AM/PM for an Echocardiogram. Your physician has requested that you have an echocardiogram. Echocardiography is a painless test that uses sound waves to create images of your heart. It provides your doctor with information about the size and shape of your heart and how well your heart's chambers and valves are working. This procedure takes approximately one hour. There are no restrictions for this procedure. Please note; depending on visual quality an IV may need to be placed.    Follow-Up: At [facility], you and your health needs are our priority.  As part of our continuing mission to provide you with exceptional heart care, we have created designated Provider Care Teams.  These Care Teams include your primary Cardiologist (physician) and Advanced Practice Providers (APPs -  Physician Assistants and Nurse Practitioners) who all work together to provide you with the care you need, when you need it.  Your next appointment:   1 month(s)  The format for your next appointment:   In Person  Provider:    Please see Ida Rogue, MD

## 2020-01-14 NOTE — Progress Notes (Signed)
Office Visit    Patient Name: Janet Olson Date of Encounter: 01/14/2020  Primary Care Provider:  Remi Haggard, FNP Primary Cardiologist:  Ida Rogue, MD  Chief Complaint    50 year old female with a history of HFpEF (EF 50-55% January 2020), end-stage renal disease on hemodialysis, ongoing tobacco abuse, COPD, hypertension, GERD, and anemia, who presents for follow-up due to recent worsening volume overload.  Past Medical History    Past Medical History:  Diagnosis Date   (HFpEF) heart failure with preserved ejection fraction ([place])    a. 2015 Echo: Nl EF. Mild to mod LVH. Mild PR/MR; b. 04/2017 Echo: EF 55-60%, no rwma, Gr1 DD. Mildly dil LA. Small effusion; c. 12/2018 Echo: EF 55-60%, mildly dil LA/PA. Mod dil RA. Mild to mod RV dil. Mildly reduced RV fxn. Mild TR. Mod elev PASP.   Acute hypoxemic respiratory failure ([place]) 01/03/2019   Acute renal failure (ARF) ([place]) 05/17/2017   Acute respiratory failure ([place]) 09/25/2018   Anemia    Asthma    Cardiomegaly    COPD (chronic obstructive pulmonary disease) (HCC)    Dialysis patient (HCC)    MON, WED , FRI   Dyspnea    ESRD (end stage renal disease) (HCC)    GERD (gastroesophageal reflux disease)    Headache    Migraines   Hypertension    Nonrheumatic tricuspid (valve) insufficiency    Respiratory failure with hypoxia ([place]) 04/21/2018   Ulcer    Vitamin D deficiency    Past Surgical History:  Procedure Laterality Date   A/V FISTULAGRAM Left 04/17/2018   Procedure: A/V FISTULAGRAM;  Surgeon: Algernon Huxley, MD;  Location: [place] [facility];  Service: Cardiovascular;  Laterality: Left;   A/V FISTULAGRAM Left 05/27/2018   Procedure: A/V FISTULAGRAM;  Surgeon: Katha Cabal, MD;  Location: [place] [facility];  Service: Cardiovascular;  Laterality: Left;   AV FISTULA PLACEMENT Left 08/08/2017   Procedure: ARTERIOVENOUS (AV) FISTULA CREATION ( BRACHIOCEPHALIC );  Surgeon: Algernon Huxley,  MD;  Location: [facility];  Service: Vascular;  Laterality: Left;   CESAREAN SECTION     x 4 (1988, 1991, 1997, 2000 )   COLONOSCOPY WITH PROPOFOL N/A 04/24/2018   Procedure: COLONOSCOPY WITH PROPOFOL;  Surgeon: Lin Landsman, MD;  Location: [facility] ENDOSCOPY;  Service: Gastroenterology;  Laterality: N/A;   DIALYSIS/PERMA CATHETER INSERTION N/A 07/16/2017   Procedure: Dialysis/Perma Catheter Insertion;  Surgeon: Katha Cabal, MD;  Location: [place] [facility];  Service: Cardiovascular;  Laterality: N/A;   DIALYSIS/PERMA CATHETER REMOVAL N/A 10/29/2017   Procedure: DIALYSIS/PERMA CATHETER REMOVAL;  Surgeon: Katha Cabal, MD;  Location: [place] [facility];  Service: Cardiovascular;  Laterality: N/A;   excision of lymph nodes Bilateral 2014   bilateral under arms.   PARATHYROID IMPLANT REMOVAL      Allergies  No Known Allergies  History of Present Illness    50 year old female with the above complex past medical history including HFpEF, end-stage renal disease on hemodialysis, ongoing tobacco abuse, COPD, hypertension, GERD, and anemia of chronic disease.  She was previously evaluated by Dr. Rockey Situ in January 2019 out of concern for abnormal echocardiogram and diastolic dysfunction.  It was felt that if she was having issues with volume, her dry weight may need to be lowered.  Repeat echo in January 2020 again showed normal LV function with an EF of 55-60%, and mildly to moderately dilated RV with mild reduction in RV systolic function and moderate  elevation of pulmonary pressures.  She was seen by [facility] cardiology with recommendation for continuing current medications and smoking cessation.    Patient says that over the past 6 months, she has slowly but surely been experiencing increasing abdominal girth.  She says that her dry weight has been maintained at dialysis however, in the setting of increasing abdominal girth, she has noticed worsening dyspnea on exertion, PND,  and orthopnea.  She does not think that she was having significant lower extremity swelling.  In November, she underwent abdominal ultrasound and this showed moderate ascites as well as fatty liver, patent portal vein, and no evidence of splenomegaly.  Due to progressive symptoms, she was seen at the [facility] emergency department on January 3.  She was found to be in sinus rhythm without acute ST or T changes.  H&H were 12 and 39 respectively, BNP was markedly elevated at 104,000, BUN and creatinine were 68 and 8.43 respectively, AST and ALT were normal.  Alkaline phosphatase was elevated at 216.  Covid screen was negative.  CT of her abdomen showed diffuse body wall edema with large volume ascites and small umbilical hernia.  She was discharged home with recommendation to follow-up with her outpatient providers for additional work-up.  She was seen by GI in January 6 with recommendation for paracentesis.  This was carried out on January 12 and 2 L were removed.  Cytology is negative for malignancy.  Following thoracentesis, she has felt significantly better with almost complete resolution of dyspnea on exertion.  She still does not feel as though her abdomen is soft as it was previously but it has certainly improved.  She denies any history of chest pain and has not had any recent presyncope or syncope.  In the setting of abdominal fullness, she has been dealing with constipation and early satiety.  Because of progressive ascites with associated dyspnea, she was referred back to cardiology.  Home Medications    Prior to Admission medications   Medication Sig Start Date End Date Taking? Authorizing Provider  albuterol (PROVENTIL) (2.5 MG/3ML) 0.083% nebulizer solution Inhale into the lungs.   Yes [provider]  albuterol (VENTOLIN HFA) 108 (90 Base) MCG/ACT inhaler SMARTSIG:2 Puff(s) By Mouth Every 4-6 Hours PRN 12/22/19  Yes [provider]  amLODipine (NORVASC) 10 MG tablet Take 1 tablet (10  mg total) by mouth daily. 06/12/17  Yes Doles-Johnson, Teah, NP  benzonatate (TESSALON) 100 MG capsule Take 100 mg by mouth 2 (two) times daily as needed. 08/03/19  Yes [provider]  benzonatate (TESSALON) 200 MG capsule Take 1 capsule (200 mg total) by mouth every 6 (six) hours as needed for cough. 02/02/19 02/02/20 Yes Fisher, Linden Dolin, PA-C  budesonide-formoterol [facility]) 160-4.5 MCG/ACT inhaler Inhale into the lungs.   Yes [provider]  calcium acetate (PHOSLO) 667 MG capsule Take 2 capsules (1,334 mg total) by mouth 3 (three) times daily with meals. 09/27/18  Yes Loletha Grayer, MD  chlorpheniramine-HYDROcodone ([place]) 10-8 MG/5ML SUER Take by mouth.   Yes [provider]  clotrimazole (MYCELEX) 10 MG troche TK 1 LOZENGE 5 TIMES D FOR 7 DAYS 02/12/19  Yes [provider]  docusate sodium (COLACE) 100 MG capsule Take by mouth.   Yes [provider]  DULERA 200-5 MCG/ACT AERO Inhale 2 puffs into the lungs 2 (two) times daily. 01/16/18  Yes [provider]  DULoxetine (CYMBALTA) 60 MG capsule Take 60 mg by mouth 2 (two) times daily.  12/05/17  Yes  [provider]  famotidine (PEPCID) 20 MG tablet Take 20 mg by mouth 2 (two) times daily. 12/19/19  Yes [provider]  fluconazole (DIFLUCAN) 100 MG tablet TK 1 T PO D 05/06/19  Yes [provider]  hydrOXYzine (ATARAX/VISTARIL) 25 MG tablet Take 25 mg by mouth daily. 12/21/19  Yes [provider]  ibuprofen (ADVIL) 600 MG tablet Take by mouth.   Yes [provider]  lidocaine-prilocaine (EMLA) cream Apply 1 application topically daily as needed [facility] for [facility]).   Yes [provider]  magic mouthwash w/lidocaine SOLN Take 5 mLs by mouth 4 (four) times daily as needed for mouth pain. 02/02/19  Yes Versie Starks, PA-C  Melatonin 10 MG CAPS Take by mouth.   Yes [provider]  metoprolol tartrate (LOPRESSOR) 50 MG tablet Take  50 mg by mouth 2 (two) times daily. 12/24/19  Yes [provider]  multivitamin (RENA-VIT) TABS tablet Take 1 tablet by mouth daily. 08/23/17  Yes [provider]  nicotine (NICODERM CQ - DOSED IN MG/24 HR) 7 mg/24hr patch Place onto the skin.   Yes [provider]  oxyCODONE (OXY IR/ROXICODONE) 5 MG immediate release tablet Take by mouth. 09/27/18  Yes [provider]  pantoprazole (PROTONIX) 40 MG tablet Take 1 tablet (40 mg total) by mouth daily. 12/20/18  Yes Schuyler Amor, MD  ramelteon (ROZEREM) 8 MG tablet Take by mouth.   Yes [provider]  sucralfate (CARAFATE) 1 g tablet Take by mouth.   Yes [provider]  tiotropium (SPIRIVA) 18 MCG inhalation capsule Place into inhaler and inhale.   Yes [provider]  torsemide (DEMADEX) 100 MG tablet Take 1 tablet (100 mg total) by mouth daily. 09/27/18  Yes Wieting, Richard, MD  triamcinolone cream (KENALOG) 0.5 % Apply topically.   Yes [provider]  Vitamin D, Ergocalciferol, (DRISDOL) 50000 units CAPS capsule Take 50,000 Units by mouth every Sunday.  10/22/17  Yes [provider]  zolpidem (AMBIEN) 5 MG tablet Take by mouth.   Yes [provider]  albuterol (VENTOLIN HFA) 108 (90 Base) MCG/ACT inhaler Inhale into the lungs.    [provider]    Review of Systems    As noted, a 62-month history of increasing abdominal girth with associated PND, orthopnea, dyspnea, constipation, and early satiety.  She denies chest pain, palpitations, presyncope, or syncope.  All other systems reviewed and are otherwise negative except as noted above.  Physical Exam    VS:  BP (!) 150/90 (BP Location: Left Arm, Patient Position: Sitting, Cuff Size: Normal)    Pulse 80    Ht 5\' 6"  (1.676 m)    Wt 176 lb 4 oz (79.9 kg)    SpO2 95%    BMI 28.45 kg/m  , BMI Body mass index is 28.45 kg/m. GEN: Well nourished, well developed, in no acute distress. HEENT:  normal. Neck: Supple, no JVD, carotid bruits, or masses. Cardiac: RRR, the bruit associated with her dialysis access in the left upper arm can be heard throughout her chest and over her left back.  No rubs, or gallops. No clubbing, cyanosis, edema.  Radials/PT 2+ and equal bilaterally.  Positive thrill over the left upper extremity dialysis access site. Respiratory:  Respirations regular and unlabored, diminished breath sounds at bases.  Otherwise clear to auscultation.   GI: Semifirm but nontender. BS + x 4. MS: no deformity or atrophy. Skin: warm and dry, no rash. Neuro:  Strength and sensation are intact. Psych: Normal affect.  Accessory Clinical Findings    ECG personally reviewed by me today -regular sinus rhythm, 80, first-degree AV block, rightward axis, left atrial enlargement, prolonged QT - no acute changes.  [facility] ED w/u 01/03/2020  H&H were 12 and 39 respectively, BNP was markedly elevated at 104,000, BUN and creatinine were 68 and 8.43 respectively, AST and ALT were normal.  Alkaline phosphatase was elevated at 216.   Covid screen was negative.   CT of her abdomen showed diffuse body wall edema with large volume ascites and small umbilical hernia.    Assessment & Plan    1.  Acute on chronic diastolic congestive heart failure/pulmonary hypertension: Patient has previously been evaluated by echocardiography in the setting of dyspnea and concern for heart murmur (LUE HD access bruit heard across chest and left back).  Most recent echocardiogram was performed in January 2020 and showed normal LV function with mildly reduced RV function and moderately elevated PASP.  Over the past 6 months, she has been experiencing increasing abdominal girth with associated dyspnea, PND, orthopnea, and early satiety.  She was recently found to have ascites on abdominal ultrasound in November and then by CT of the abdomen in early January and is now status post 2 L paracentesis on January 12.  She is a  dialysis patient and has been compliant with hemodialysis and also continues to make urine on oral torsemide therapy.  She says that her dry weight has been maintained despite increasing abdominal girth.  I will arrange for a follow-up echocardiogram to reevaluate LV and RV function as well as PA pressures.  She does have a history of snoring and I will refer for sleep study as this may contribute to pulmonary hypertension.  Ultimately, she may require right & left heart catheterization to better understand her filling pressures and potential contribution to right heart failure and ascites.  As her blood pressure is elevated today, I am switching her from metoprolol to carvedilol 12.5 mg twice daily.  Plan to follow-up following her echocardiogram in a couple of weeks.  2.  Ascites: See #1.  Status post paracentesis on January 12 -2 L.  Follow-up echo as above.  Suspect PAH and RV dysfxn playing a role.  GI f/u also scheduled.  3.  Essential hypertension: Blood pressure elevated today.  I am switching her from metoprolol to carvedilol for better blood pressure management.  This could likely be titrated in the future.  She otherwise remains on amlodipine and torsemide.  4.  Ongoing tobacco abuse: Complete cessation advised.  5.  ESRD: MWF HD per nephrology.    6.  Prolonged QT: QTc 523 (QT 454). QT prolonged on 12/21/18 ecg as well.  Labs from 01/03/20 ED visit: K 4.3, Mg 1.7, Ca2+ 10.1.  Avoid QT prolonging agents.  May benefit from Mg supplementation - defer to nephrology.    7.  Disposition: Follow-up echo as above.  Follow-up in clinic in approximately 2 weeks.  Murray Hodgkins, NP 01/14/2020, 5:41 PM

## 2020-01-15 NOTE — Progress Notes (Signed)
Her SAAG is < 1.1 and Total protein is > 2.5 consistent with combination of cardiac/nephrogenic ascites. Not consistent with portal HTN  Thanks Zlaty Alexa

## 2020-01-16 LAB — BODY FLUID CULTURE: Culture: NO GROWTH

## 2020-02-09 ENCOUNTER — Other Ambulatory Visit: Payer: Medicare Other

## 2020-02-11 ENCOUNTER — Encounter: Payer: Self-pay | Admitting: Internal Medicine

## 2020-02-11 ENCOUNTER — Ambulatory Visit: Payer: Medicaid Other | Admitting: Gastroenterology

## 2020-02-11 ENCOUNTER — Other Ambulatory Visit: Payer: Self-pay

## 2020-02-11 ENCOUNTER — Ambulatory Visit (INDEPENDENT_AMBULATORY_CARE_PROVIDER_SITE_OTHER): Payer: Medicare Other | Admitting: Internal Medicine

## 2020-02-11 VITALS — BP 144/82 | HR 70 | Temp 98.0°F | Ht 66.0 in | Wt 182.0 lb

## 2020-02-11 DIAGNOSIS — F17210 Nicotine dependence, cigarettes, uncomplicated: Secondary | ICD-10-CM | POA: Diagnosis not present

## 2020-02-11 DIAGNOSIS — Z720 Tobacco use: Secondary | ICD-10-CM

## 2020-02-11 DIAGNOSIS — G4719 Other hypersomnia: Secondary | ICD-10-CM

## 2020-02-11 DIAGNOSIS — J449 Chronic obstructive pulmonary disease, unspecified: Secondary | ICD-10-CM

## 2020-02-11 NOTE — Progress Notes (Signed)
Name: Janet Olson MRN: [id number] DOB: 07/27/1970     CONSULTATION DATE: 02/11/2020  REFERRING MD : Corlis Hove  CHIEF COMPLAINT: EXCESSIVE DAYTIME SLEEPINESS   HISTORY OF PRESENT ILLNESS: Patient is seen today for problems and issues with sleep related to excessive daytime sleepiness Patient  has been having sleep problems for many years Patient has been having excessive daytime sleepiness for a long time Patient has been having extreme fatigue and tiredness, lack of energy ?? very Loud snoring every night ?? struggling breathe at night and gasps for air   Discussed sleep data and reviewed with patient.  Encouraged proper weight management.  Discussed driving precautions and its relationship with hypersomnolence.  Discussed operating dangerous equipment and its relationship with hypersomnolence.  Discussed sleep hygiene, and benefits of a fixed sleep waked time.  The importance of getting eight or more hours of sleep discussed with patient.  Discussed limiting the use of the computer and television before bedtime.  Decrease naps during the day, so night time sleep will become enhanced.  Limit caffeine, and sleep deprivation.  HTN, stroke, and heart failure are potential risk factors.    EPWORTH SLEEP SCORE 6  Patient states she has problems falling asleep and  maintaining sleep Feels she has restless sleep at night I recommend obtaining a sleep study for further evaluation  Patient has diastolic dysfunction and congestive heart failure sees cardiology at [facility] Patient also has a diagnosis of end-stage renal disease on hemodialysis sees Dr. Holley Raring with nephrology  Patient also has diagnosis of COPD Patient currently smokes 1 pack a day Patient is 1 pack a day 30-pack-year history  COPD seems to be stable at this time on current inhaler regimen with [facility] She uses albuterol as needed   Smoking Assessment and Cessation Counseling   Upon further questioning,  Patient smokes 1 ppd  I have advised patient to quit/stop smoking as soon as possible due to high risk for multiple medical problems  Patient  is NOT willing to quit smoking  I have advised patient that we can assist and have options of Nicotine replacement therapy. I also advised patient on behavioral therapy and can provide oral medication therapy in conjunction with the other therapies  Follow up next Office visit  for assessment of smoking cessation  Smoking cessation counseling advised for 4 minutes    PAST MEDICAL HISTORY :   has a past medical history of (HFpEF) heart failure with preserved ejection fraction ([place]), Acute hypoxemic respiratory failure ([place]) (01/03/2019), Acute renal failure (ARF) ([place]) (05/17/2017), Acute respiratory failure ([place]) (09/25/2018), Anemia, Asthma, Cardiomegaly, COPD (chronic obstructive pulmonary disease) ([place]), Dialysis patient ([place]), Dyspnea, ESRD (end stage renal disease) ([place]), GERD (gastroesophageal reflux disease), Headache, Hypertension, Nonrheumatic tricuspid (valve) insufficiency, Respiratory failure with hypoxia ([place]) (04/21/2018), Ulcer, and Vitamin D deficiency.  has a past surgical history that includes Cesarean section; excision of lymph nodes (Bilateral, 2014); DIALYSIS/PERMA CATHETER INSERTION (N/A, 07/16/2017); AV fistula placement (Left, 08/08/2017); DIALYSIS/PERMA CATHETER REMOVAL (N/A, 10/29/2017); A/V Fistulagram (Left, 04/17/2018); Colonoscopy with propofol (N/A, 04/24/2018); A/V Fistulagram (Left, 05/27/2018); and Parathyroid implant removal. Prior to Admission medications   Medication Sig Start Date End Date Taking? Authorizing Provider  albuterol (PROVENTIL) (2.5 MG/3ML) 0.083% nebulizer solution Inhale into the lungs every 6 (six) hours as needed.    Yes [provider]  albuterol (VENTOLIN HFA) 108 (90 Base) MCG/ACT inhaler Inhale into the lungs.   Yes [provider]  albuterol (VENTOLIN HFA) 108 (90 Base) MCG/ACT inhaler  SMARTSIG:2  Puff(s) By Mouth Every 4-6 Hours PRN 12/22/19  Yes [provider]  amLODipine (NORVASC) 10 MG tablet Take 1 tablet (10 mg total) by mouth daily. 06/12/17  Yes Doles-Johnson, Teah, NP  benzonatate (TESSALON) 100 MG capsule Take 100 mg by mouth 2 (two) times daily as needed. 08/03/19  Yes [provider]  calcium acetate (PHOSLO) 667 MG capsule Take 2 capsules (1,334 mg total) by mouth 3 (three) times daily with meals. 09/27/18  Yes Loletha Grayer, MD  chlorpheniramine-HYDROcodone ([place]) 10-8 MG/5ML SUER Take by mouth as needed.    Yes [provider]  clotrimazole (MYCELEX) 10 MG troche TK 1 LOZENGE 5 TIMES D FOR 7 DAYS 02/12/19  Yes [provider]  docusate sodium (COLACE) 100 MG capsule Take by mouth daily as needed.    Yes [provider]  DULERA 200-5 MCG/ACT AERO Inhale 2 puffs into the lungs 2 (two) times daily. 01/16/18  Yes [provider]  DULoxetine (CYMBALTA) 60 MG capsule Take 60 mg by mouth 2 (two) times daily.  12/05/17  Yes [provider]  famotidine (PEPCID) 20 MG tablet Take 20 mg by mouth 2 (two) times daily. 12/19/19  Yes [provider]  hydrOXYzine (ATARAX/VISTARIL) 25 MG tablet Take 25 mg by mouth daily. 12/21/19  Yes [provider]  ibuprofen (ADVIL) 600 MG tablet Take by mouth.   Yes [provider]  lidocaine-prilocaine (EMLA) cream Apply 1 application topically daily as needed [facility] access for dialysis).   Yes [provider]  linaCLOtide (LINZESS PO) Take by mouth daily.   Yes [provider]  magic mouthwash w/lidocaine SOLN Take 5 mLs by mouth 4 (four) times daily as needed for mouth pain. 02/02/19  Yes Fisher, Linden Dolin, PA-C  Melatonin 10 MG CAPS Take by mouth at bedtime as needed.    Yes [provider]  multivitamin (RENA-VIT) TABS tablet Take 1 tablet by mouth daily. 08/23/17  Yes [provider]  nicotine (NICODERM CQ - DOSED IN  MG/24 HR) 7 mg/24hr patch Place onto the skin. Uses occasionally   Yes [provider]  oxyCODONE (OXY IR/ROXICODONE) 5 MG immediate release tablet Take by mouth as needed.  09/27/18  Yes [provider]  pantoprazole (PROTONIX) 40 MG tablet Take 1 tablet (40 mg total) by mouth daily. 12/20/18  Yes Schuyler Amor, MD  ramelteon (ROZEREM) 8 MG tablet Take by mouth at bedtime.    Yes [provider]  sucralfate (CARAFATE) 1 g tablet Take by mouth as needed.    Yes [provider]  tiotropium (SPIRIVA) 18 MCG inhalation capsule Place into inhaler and inhale.   Yes [provider]  torsemide (DEMADEX) 100 MG tablet Take 1 tablet (100 mg total) by mouth daily. 09/27/18  Yes Wieting, Richard, MD  triamcinolone cream (KENALOG) 0.5 % Apply topically as directed.    Yes [provider]  Vitamin D, Ergocalciferol, (DRISDOL) 50000 units CAPS capsule Take 50,000 Units by mouth every Sunday.  10/22/17  Yes [provider]  zolpidem (AMBIEN) 5 MG tablet Take by mouth at bedtime as needed.    Yes [provider]  carvedilol (COREG) 12.5 MG tablet Take 1 tablet (12.5 mg total) by mouth 2 (two) times daily. Patient not taking: Reported on 02/11/2020 01/14/20 04/13/20  Theora Gianotti, NP   No Known Allergies  FAMILY HISTORY:  family history includes Diabetes in her mother. SOCIAL HISTORY:  reports that she has been smoking cigarettes. She has a  15.00 pack-year smoking history. She has never used smokeless tobacco. She reports that she does not drink alcohol or use drugs.  REVIEW OF SYSTEMS:   Constitutional: Negative for fever, chills, weight loss, malaise/fatigue and diaphoresis.  HENT: Negative for hearing loss, ear pain, nosebleeds, congestion, sore throat, neck pain, tinnitus and ear discharge.   Eyes: Negative for blurred vision, double vision, photophobia, pain, discharge and redness.  Respiratory: Negative for cough,  hemoptysis, sputum production, shortness of breath, wheezing and stridor.   Cardiovascular: Negative for chest pain, palpitations, orthopnea, claudication, leg swelling and PND.  Gastrointestinal: Negative for heartburn, nausea, vomiting, abdominal pain, diarrhea, constipation, blood in stool and melena.  Genitourinary: Negative for dysuria, urgency, frequency, hematuria and flank pain.  Musculoskeletal: Negative for myalgias, back pain, joint pain and falls.  Skin: Negative for itching and rash.  Neurological: Negative for dizziness, tingling, tremors, sensory change, speech change, focal weakness, seizures, loss of consciousness, weakness and headaches.  Endo/Heme/Allergies: Negative for environmental allergies and polydipsia. Does not bruise/bleed easily.  ALL OTHER ROS ARE NEGATIVE   BP (!) 144/82 (BP Location: Left Arm, Cuff Size: Normal)   Pulse 70   Temp 98 F (36.7 C) (Temporal)   Ht 5\' 6"  (1.676 m)   Wt 182 lb (82.6 kg)   SpO2 94%   BMI 29.38 kg/m   Physical Examination:   General Appearance: No distress  Neuro:without focal findings,  speech normal,  HEENT: PERRLA, EOM intact.   Pulmonary: normal breath sounds, No wheezing.  CardiovascularNormal S1,S2.  No m/r/g.   Abdomen: Benign, Soft, non-tender. Renal:  No costovertebral tenderness  GU:  Not performed at this time. Endoc: No evident thyromegaly, no signs of acromegaly. Skin:   warm, no rashes, no ecchymosis left arm fistula thrill palpated, bruit auscultated Extremities: normal, no cyanosis, clubbing. NEUROLOGIC: Cranial nerves II through XII are intact. No gross focal neurological deficits.  PSYCHIATRIC: Mood, affect within normal limits.        ASSESSMENT AND PLAN SYNOPSIS   50 year old pleasant African-American female with multiple medical issues including cardiac disease she sees cardiology at [facility],  also with end-stage renal disease on dialysis with a diagnosis of COPD With signs and symptoms of  probable underlying sleep apnea  Excessive daytime sleepiness with questionable snoring and possible witnessed apneas Patient will need sleep study for definitive diagnosis to assess for obstructive sleep apnea At this time I would recommend obtaining a home sleep study  COPD At this time her COPD seems to be stable at this time on current inhaler regimen She is currently on Dulera as her maintenance therapy She uses albuterol as needed Patient continues to smoke  Smoking cessation strongly advised Counseling provided for more than 4 minutes I have advised to set a quit date  Cardiac disease and congestive heart failure Continue follow-up with cardiology at [facility] as scheduled   End-stage renal disease on dialysis Continue to follow-up with Dr. Holley Raring with nephrology as scheduled   COVID-19 EDUCATION: The signs and symptoms of COVID-19 were discussed with the patient and how to seek care for testing.  The importance of social distancing was discussed today. Hand Washing Techniques and avoid touching face was advised.     MEDICATION ADJUSTMENTS/LABS AND TESTS ORDERED: Smoking cessation strongly advised Continue inhalers as prescribed Recommend home sleep study to assess for sleep apnea   CURRENT MEDICATIONS REVIEWED AT [place]   Patient satisfied with Plan of action and management. All questions answered  Follow up in  3 months   Corrin Parker, M.D.  [facility]  Medical Director [place] Director [place] Department

## 2020-02-11 NOTE — Patient Instructions (Signed)
Recommend home sleep study at this time Continue inhalers as prescribed  Please stop smoking!!

## 2020-02-16 ENCOUNTER — Ambulatory Visit: Payer: Medicare Other | Admitting: Cardiovascular Disease

## 2020-02-23 ENCOUNTER — Other Ambulatory Visit: Payer: Self-pay

## 2020-02-23 ENCOUNTER — Ambulatory Visit (INDEPENDENT_AMBULATORY_CARE_PROVIDER_SITE_OTHER): Payer: Medicare Other

## 2020-02-23 DIAGNOSIS — I5033 Acute on chronic diastolic (congestive) heart failure: Secondary | ICD-10-CM

## 2020-02-24 ENCOUNTER — Telehealth: Payer: Self-pay

## 2020-02-24 NOTE — Telephone Encounter (Signed)
Pamala Hurry is specialized scheduled is calling because the patient called her wanting to scheduled another Paracentesis. Pamala Hurry states there is no order for this. Called patient to find out why she needed this done. Unable to leave a message for call back because voicemail is not set up

## 2020-02-25 NOTE — Telephone Encounter (Signed)
Patient is calling because she still has a lot of fluid on her stomach. Patient had a US Paracentesis on 01/12/2020. Patient states she would like another one and was wondering if she could have a standing order to have this done.

## 2020-02-26 NOTE — Telephone Encounter (Signed)
Please reach out to Janet Olson to arrange for in-office f/u early next week.  She is scheduled to see Dr. Rockey Situ 3/9, however it sounds like her abd bloating/ascites is becoming intolerable and she likely requires repeat paracentesis through IR.  She will need to be eval first, however.

## 2020-02-26 NOTE — Telephone Encounter (Signed)
Janet Olson  Please inform patient that she does not have cirrhosis of liver to cause ascites Her ascites is secondary to heart/kidneys  She has to follow up with her cardiologist and nephrologist to arrange for paracentesis CC'ed Dr Sharolyn Douglas and Dr Juleen China  Thank you  Sherri Sear

## 2020-02-26 NOTE — Telephone Encounter (Signed)
Tried to call patient but when I called her number it said call can not be completed

## 2020-02-26 NOTE — Telephone Encounter (Signed)
Attempted to call the patient. No answer- message still states the call cannot be completed at this time. Will need to try the patient back on Monday morning.  Izora Gala is holding a slot on Dr. Donivan Scull schedule at 1:40 pm on Monday for this patient.

## 2020-02-26 NOTE — Telephone Encounter (Addendum)
Attempted to call the patient. No answer- message states the call connect be completed at this time.   I attempted to call the patient's mother, Janet (ok per [facility]) to confirm we were calling the correct # for the patient. Per Ms. Olson, this is the correct #, but the patient is currently at HD and will not be home until after 4:00 pm.  I advised we will try the patient back after 4:00 pm.

## 2020-02-26 NOTE — Telephone Encounter (Signed)
Tried to call patient again and call said call can not be completed as dialed

## 2020-02-29 ENCOUNTER — Other Ambulatory Visit: Payer: Self-pay

## 2020-02-29 NOTE — Telephone Encounter (Signed)
Spoke with patients son per release form and he reports that she does not have phone now. Reviewed that patient is scheduled to see provider here in our office next Tuesday 03/08/20 at 4:20 PM. He states that he will make her aware of this appointment. He had no further questions at this time.

## 2020-02-29 NOTE — Telephone Encounter (Signed)
"  Your call can not be completed at this time."

## 2020-02-29 NOTE — Telephone Encounter (Signed)
On release form called number and left message to have patient call us.

## 2020-02-29 NOTE — Telephone Encounter (Signed)
Called to schedule patient, unable to leave message

## 2020-03-01 ENCOUNTER — Encounter: Payer: Self-pay | Admitting: Gastroenterology

## 2020-03-01 ENCOUNTER — Ambulatory Visit: Payer: Medicare Other | Admitting: Gastroenterology

## 2020-03-01 ENCOUNTER — Ambulatory Visit: Payer: Medicaid Other | Admitting: Gastroenterology

## 2020-03-01 ENCOUNTER — Telehealth: Payer: Self-pay | Admitting: Gastroenterology

## 2020-03-01 NOTE — Telephone Encounter (Signed)
Pt is calling she needs a paracentice and  Have fluid removed please call [phone]

## 2020-03-01 NOTE — Telephone Encounter (Signed)
Called patient back and informed patient that we did not order this for her. That her kidney or heart doctor needed to order this. Informed patient that one of them order this for her and it is 03/08/2020 at 4:20

## 2020-03-03 ENCOUNTER — Other Ambulatory Visit: Payer: Self-pay

## 2020-03-03 ENCOUNTER — Inpatient Hospital Stay: Payer: Medicare Other

## 2020-03-03 ENCOUNTER — Observation Stay
Admission: EM | Admit: 2020-03-03 | Discharge: 2020-03-04 | Disposition: A | Discharge status: Home / Self Care | Payer: Medicare Other | Attending: Internal Medicine | Admitting: Internal Medicine

## 2020-03-03 ENCOUNTER — Emergency Department: Payer: Medicare Other

## 2020-03-03 DIAGNOSIS — K219 Gastro-esophageal reflux disease without esophagitis: Secondary | ICD-10-CM | POA: Insufficient documentation

## 2020-03-03 DIAGNOSIS — Z992 Dependence on renal dialysis: Secondary | ICD-10-CM | POA: Diagnosis not present

## 2020-03-03 DIAGNOSIS — E875 Hyperkalemia: Secondary | ICD-10-CM | POA: Diagnosis not present

## 2020-03-03 DIAGNOSIS — Z20822 Contact with and (suspected) exposure to covid-19: Secondary | ICD-10-CM | POA: Insufficient documentation

## 2020-03-03 DIAGNOSIS — I132 Hypertensive heart and chronic kidney disease with heart failure and with stage 5 chronic kidney disease, or end stage renal disease: Principal | ICD-10-CM | POA: Insufficient documentation

## 2020-03-03 DIAGNOSIS — F17210 Nicotine dependence, cigarettes, uncomplicated: Secondary | ICD-10-CM | POA: Diagnosis not present

## 2020-03-03 DIAGNOSIS — E871 Hypo-osmolality and hyponatremia: Secondary | ICD-10-CM | POA: Diagnosis not present

## 2020-03-03 DIAGNOSIS — Z7951 Long term (current) use of inhaled steroids: Secondary | ICD-10-CM | POA: Insufficient documentation

## 2020-03-03 DIAGNOSIS — R188 Other ascites: Secondary | ICD-10-CM | POA: Diagnosis not present

## 2020-03-03 DIAGNOSIS — Z79899 Other long term (current) drug therapy: Secondary | ICD-10-CM | POA: Diagnosis not present

## 2020-03-03 DIAGNOSIS — J449 Chronic obstructive pulmonary disease, unspecified: Secondary | ICD-10-CM | POA: Insufficient documentation

## 2020-03-03 DIAGNOSIS — I5033 Acute on chronic diastolic (congestive) heart failure: Secondary | ICD-10-CM | POA: Diagnosis present

## 2020-03-03 DIAGNOSIS — N186 End stage renal disease: Secondary | ICD-10-CM | POA: Diagnosis not present

## 2020-03-03 DIAGNOSIS — J810 Acute pulmonary edema: Secondary | ICD-10-CM

## 2020-03-03 DIAGNOSIS — I5043 Acute on chronic combined systolic (congestive) and diastolic (congestive) heart failure: Secondary | ICD-10-CM | POA: Diagnosis not present

## 2020-03-03 DIAGNOSIS — E8770 Fluid overload, unspecified: Secondary | ICD-10-CM | POA: Insufficient documentation

## 2020-03-03 DIAGNOSIS — D631 Anemia in chronic kidney disease: Secondary | ICD-10-CM | POA: Diagnosis not present

## 2020-03-03 DIAGNOSIS — N049 Nephrotic syndrome with unspecified morphologic changes: Secondary | ICD-10-CM

## 2020-03-03 LAB — CBC
HCT: 38 % (ref 36.0–46.0)
HCT: 33.9 % — ABNORMAL LOW (ref 36.0–46.0)
Hemoglobin: 12.3 g/dL (ref 12.0–15.0)
Hemoglobin: 11.4 g/dL — ABNORMAL LOW (ref 12.0–15.0)
MCH: 26.9 pg (ref 26.0–34.0)
MCH: 27.5 pg (ref 26.0–34.0)
MCHC: 32.4 g/dL (ref 30.0–36.0)
MCHC: 33.6 g/dL (ref 30.0–36.0)
MCV: 83.2 fL (ref 80.0–100.0)
MCV: 81.7 fL (ref 80.0–100.0)
Platelets: 255 10*3/uL (ref 150–400)
Platelets: 223 10*3/uL (ref 150–400)
RBC: 4.57 MIL/uL (ref 3.87–5.11)
RBC: 4.15 MIL/uL (ref 3.87–5.11)
RDW: 15.9 % — ABNORMAL HIGH (ref 11.5–15.5)
RDW: 15.7 % — ABNORMAL HIGH (ref 11.5–15.5)
WBC: 5.7 10*3/uL (ref 4.0–10.5)
WBC: 4.1 10*3/uL (ref 4.0–10.5)
nRBC: 0 % (ref 0.0–0.2)
nRBC: 0 % (ref 0.0–0.2)

## 2020-03-03 LAB — COMPREHENSIVE METABOLIC PANEL
ALT: 21 U/L (ref 0–44)
AST: 21 U/L (ref 15–41)
Albumin: 3.6 g/dL (ref 3.5–5.0)
Alkaline Phosphatase: 191 U/L — ABNORMAL HIGH (ref 38–126)
Anion gap: 23 — ABNORMAL HIGH (ref 5–15)
BUN: 124 mg/dL — ABNORMAL HIGH (ref 6–20)
CO2: 14 mmol/L — ABNORMAL LOW (ref 22–32)
Calcium: 8.5 mg/dL — ABNORMAL LOW (ref 8.9–10.3)
Chloride: 89 mmol/L — ABNORMAL LOW (ref 98–111)
Creatinine, Ser: 12.55 mg/dL — ABNORMAL HIGH (ref 0.44–1.00)
GFR calc Af Amer: 4 mL/min — ABNORMAL LOW (ref >60)
GFR calc non Af Amer: 3 mL/min — ABNORMAL LOW (ref >60)
Glucose, Bld: 53 mg/dL — ABNORMAL LOW (ref 70–99)
Potassium: 6.1 mmol/L — ABNORMAL HIGH (ref 3.5–5.1)
Sodium: 126 mmol/L — ABNORMAL LOW (ref 135–145)
Total Bilirubin: 1.1 mg/dL (ref 0.3–1.2)
Total Protein: 8.1 g/dL (ref 6.5–8.1)

## 2020-03-03 LAB — TROPONIN I (HIGH SENSITIVITY)
Troponin I (High Sensitivity): 19 ng/L — ABNORMAL HIGH (ref <18)
Troponin I (High Sensitivity): 20 ng/L — ABNORMAL HIGH (ref <18)

## 2020-03-03 LAB — LIPASE, BLOOD: Lipase: 31 U/L (ref 11–51)

## 2020-03-03 LAB — RESPIRATORY PANEL BY RT PCR (FLU A&B, COVID)
Influenza A by PCR: NEGATIVE
Influenza B by PCR: NEGATIVE
SARS Coronavirus 2 by RT PCR: NEGATIVE

## 2020-03-03 LAB — PHOSPHORUS: Phosphorus: 6.4 mg/dL — ABNORMAL HIGH (ref 2.5–4.6)

## 2020-03-03 LAB — HIV ANTIBODY (ROUTINE TESTING W REFLEX): HIV Screen 4th Generation wRfx: NONREACTIVE

## 2020-03-03 MED ORDER — LINACLOTIDE 145 MCG PO CAPS
145.0000 ug | ORAL_CAPSULE | Freq: Every day | ORAL | Status: DC
  Administered 2020-03-03 – 2020-03-04 (×2): 145 ug via ORAL
  Filled 2020-03-03 (×2): qty 1

## 2020-03-03 MED ORDER — CLOTRIMAZOLE 10 MG MT TROC
10.0000 mg | Freq: Every day | OROMUCOSAL | Status: DC
  Administered 2020-03-03 – 2020-03-04 (×3): 10 mg via ORAL
  Filled 2020-03-03 (×7): qty 1

## 2020-03-03 MED ORDER — SODIUM CHLORIDE 0.9% FLUSH: 3.0000 mL | INTRAVENOUS | Status: DC | PRN

## 2020-03-03 MED ORDER — CHLORHEXIDINE GLUCONATE CLOTH 2 % EX PADS
6.0000 | MEDICATED_PAD | Freq: Every day | CUTANEOUS | Status: DC
  Administered 2020-03-03 – 2020-03-04 (×2): 6 via TOPICAL
  Filled 2020-03-03: qty 6

## 2020-03-03 MED ORDER — DOCUSATE SODIUM 100 MG PO CAPS: 200.0000 mg | ORAL_CAPSULE | Freq: Two times a day (BID) | ORAL | Status: DC | PRN

## 2020-03-03 MED ORDER — ALBUTEROL SULFATE (2.5 MG/3ML) 0.083% IN NEBU: 2.5000 mg | INHALATION_SOLUTION | Freq: Four times a day (QID) | RESPIRATORY_TRACT | Status: DC | PRN

## 2020-03-03 MED ORDER — ZOLPIDEM TARTRATE 5 MG PO TABS: 5.0000 mg | ORAL_TABLET | Freq: Every evening | ORAL | Status: DC | PRN

## 2020-03-03 MED ORDER — FAMOTIDINE 20 MG PO TABS: 20.0000 mg | ORAL_TABLET | Freq: Two times a day (BID) | ORAL | Status: DC

## 2020-03-03 MED ORDER — CARVEDILOL 12.5 MG PO TABS
12.5000 mg | ORAL_TABLET | Freq: Two times a day (BID) | ORAL | Status: DC
  Administered 2020-03-03 – 2020-03-04 (×2): 12.5 mg via ORAL
  Filled 2020-03-03: qty 2

## 2020-03-03 MED ORDER — CALCIUM ACETATE (PHOS BINDER) 667 MG PO CAPS
1334.0000 mg | ORAL_CAPSULE | Freq: Three times a day (TID) | ORAL | Status: DC
  Administered 2020-03-03 – 2020-03-04 (×2): 1334 mg via ORAL
  Filled 2020-03-03 (×5): qty 2

## 2020-03-03 MED ORDER — HEPARIN SODIUM (PORCINE) 5000 UNIT/ML IJ SOLN
5000.0000 [IU] | Freq: Three times a day (TID) | INTRAMUSCULAR | Status: DC
  Administered 2020-03-03 – 2020-03-04 (×3): 5000 [IU] via SUBCUTANEOUS
  Filled 2020-03-03 (×3): qty 1

## 2020-03-03 MED ORDER — DULOXETINE HCL 30 MG PO CPEP
60.0000 mg | ORAL_CAPSULE | Freq: Two times a day (BID) | ORAL | Status: DC
  Administered 2020-03-03 – 2020-03-04 (×3): 60 mg via ORAL
  Filled 2020-03-03 (×2): qty 1
  Filled 2020-03-03: qty 2
  Filled 2020-03-03 (×2): qty 1

## 2020-03-03 MED ORDER — MAGIC MOUTHWASH W/LIDOCAINE: 5.0000 mL | Freq: Four times a day (QID) | ORAL | Status: DC | PRN

## 2020-03-03 MED ORDER — METOPROLOL TARTRATE 50 MG PO TABS: 50.0000 mg | ORAL_TABLET | Freq: Two times a day (BID) | ORAL | Status: DC

## 2020-03-03 MED ORDER — HYDROXYZINE HCL 25 MG PO TABS
25.0000 mg | ORAL_TABLET | Freq: Every day | ORAL | Status: DC
  Administered 2020-03-03 – 2020-03-04 (×2): 25 mg via ORAL
  Filled 2020-03-03 (×2): qty 1

## 2020-03-03 MED ORDER — LIDOCAINE-PRILOCAINE 2.5-2.5 % EX CREA
1.0000 "application " | TOPICAL_CREAM | Freq: Every day | CUTANEOUS | Status: DC | PRN
  Filled 2020-03-03: qty 5

## 2020-03-03 MED ORDER — RENA-VITE PO TABS
1.0000 | ORAL_TABLET | Freq: Every day | ORAL | Status: DC
  Administered 2020-03-03 – 2020-03-04 (×2): 1 via ORAL
  Filled 2020-03-03 (×2): qty 1

## 2020-03-03 MED ORDER — TRIAMCINOLONE ACETONIDE 0.5 % EX CREA
TOPICAL_CREAM | Freq: Two times a day (BID) | CUTANEOUS | Status: DC
  Administered 2020-03-03: 1 via TOPICAL
  Filled 2020-03-03: qty 15

## 2020-03-03 MED ORDER — TORSEMIDE 20 MG PO TABS
100.0000 mg | ORAL_TABLET | Freq: Every day | ORAL | Status: DC
  Administered 2020-03-03 – 2020-03-04 (×2): 100 mg via ORAL
  Filled 2020-03-03 (×2): qty 1
  Filled 2020-03-03: qty 5

## 2020-03-03 MED ORDER — TIOTROPIUM BROMIDE MONOHYDRATE 18 MCG IN CAPS
18.0000 ug | ORAL_CAPSULE | Freq: Every day | RESPIRATORY_TRACT | Status: DC
  Administered 2020-03-03 – 2020-03-04 (×2): 18 ug via RESPIRATORY_TRACT
  Filled 2020-03-03: qty 5

## 2020-03-03 MED ORDER — SUCRALFATE 1 G PO TABS
1.0000 g | ORAL_TABLET | Freq: Three times a day (TID) | ORAL | Status: DC
  Administered 2020-03-03 – 2020-03-04 (×3): 1 g via ORAL
  Filled 2020-03-03 (×3): qty 1

## 2020-03-03 MED ORDER — HEPARIN SODIUM (PORCINE) 1000 UNIT/ML DIALYSIS: 20.0000 [IU]/kg | INTRAMUSCULAR | Status: DC | PRN

## 2020-03-03 MED ORDER — FAMOTIDINE 20 MG PO TABS
10.0000 mg | ORAL_TABLET | Freq: Every day | ORAL | Status: DC
  Administered 2020-03-03: 10 mg via ORAL
  Filled 2020-03-03: qty 1

## 2020-03-03 MED ORDER — OXYCODONE HCL 5 MG PO TABS: 5.0000 mg | ORAL_TABLET | Freq: Four times a day (QID) | ORAL | Status: DC | PRN

## 2020-03-03 MED ORDER — LIDOCAINE VISCOUS HCL 2 % MT SOLN
5.0000 mL | Freq: Four times a day (QID) | OROMUCOSAL | Status: DC | PRN
  Administered 2020-03-03: 5 mL via OROMUCOSAL
  Filled 2020-03-03 (×2): qty 15

## 2020-03-03 MED ORDER — BENZONATATE 100 MG PO CAPS: 100.0000 mg | ORAL_CAPSULE | Freq: Two times a day (BID) | ORAL | Status: DC | PRN

## 2020-03-03 MED ORDER — NICOTINE 7 MG/24HR TD PT24
7.0000 mg | MEDICATED_PATCH | Freq: Every day | TRANSDERMAL | Status: DC
  Administered 2020-03-04: 7 mg via TRANSDERMAL
  Filled 2020-03-03 (×2): qty 1

## 2020-03-03 MED ORDER — MOMETASONE FURO-FORMOTEROL FUM 200-5 MCG/ACT IN AERO
2.0000 | INHALATION_SPRAY | Freq: Two times a day (BID) | RESPIRATORY_TRACT | Status: DC
  Administered 2020-03-03 – 2020-03-04 (×3): 2 via RESPIRATORY_TRACT
  Filled 2020-03-03: qty 8.8

## 2020-03-03 MED ORDER — AMLODIPINE BESYLATE 10 MG PO TABS
10.0000 mg | ORAL_TABLET | Freq: Every day | ORAL | Status: DC
  Administered 2020-03-03 – 2020-03-04 (×2): 10 mg via ORAL
  Filled 2020-03-03: qty 2
  Filled 2020-03-03: qty 1

## 2020-03-03 MED ORDER — SODIUM CHLORIDE 0.9 % IV SOLN: 250.0000 mL | INTRAVENOUS | Status: DC | PRN

## 2020-03-03 MED ORDER — ONDANSETRON HCL 4 MG/2ML IJ SOLN
4.0000 mg | Freq: Once | INTRAMUSCULAR | Status: AC
  Administered 2020-03-03: 4 mg via INTRAVENOUS
  Filled 2020-03-03: qty 2

## 2020-03-03 MED ORDER — ACETAMINOPHEN 325 MG PO TABS
650.0000 mg | ORAL_TABLET | ORAL | Status: DC | PRN
  Administered 2020-03-03 (×2): 650 mg via ORAL
  Filled 2020-03-03 (×2): qty 2

## 2020-03-03 MED ORDER — ONDANSETRON HCL 4 MG/2ML IJ SOLN: 4.0000 mg | Freq: Four times a day (QID) | INTRAMUSCULAR | Status: DC | PRN

## 2020-03-03 MED ORDER — PANTOPRAZOLE SODIUM 40 MG PO TBEC
40.0000 mg | DELAYED_RELEASE_TABLET | Freq: Every day | ORAL | Status: DC
  Administered 2020-03-03 – 2020-03-04 (×2): 40 mg via ORAL
  Filled 2020-03-03 (×2): qty 1

## 2020-03-03 MED ORDER — MAGIC MOUTHWASH
5.0000 mL | Freq: Four times a day (QID) | ORAL | Status: DC | PRN
  Filled 2020-03-03: qty 10

## 2020-03-03 MED ORDER — ASPIRIN EC 81 MG PO TBEC
81.0000 mg | DELAYED_RELEASE_TABLET | Freq: Every day | ORAL | Status: DC
  Administered 2020-03-03 – 2020-03-04 (×2): 81 mg via ORAL
  Filled 2020-03-03 (×2): qty 1

## 2020-03-03 MED ORDER — FAMOTIDINE 20 MG PO TABS: 10.0000 mg | ORAL_TABLET | Freq: Two times a day (BID) | ORAL | Status: DC

## 2020-03-03 MED ORDER — VITAMIN D (ERGOCALCIFEROL) 1.25 MG (50000 UNIT) PO CAPS: 50000.0000 [IU] | ORAL_CAPSULE | ORAL | Status: DC

## 2020-03-03 MED ORDER — SODIUM CHLORIDE 0.9% FLUSH
3.0000 mL | Freq: Two times a day (BID) | INTRAVENOUS | Status: DC
  Administered 2020-03-03 – 2020-03-04 (×3): 3 mL via INTRAVENOUS

## 2020-03-03 MED ORDER — MORPHINE SULFATE (PF) 2 MG/ML IV SOLN
2.0000 mg | Freq: Once | INTRAVENOUS | Status: AC
  Administered 2020-03-03: 2 mg via INTRAVENOUS
  Filled 2020-03-03: qty 1

## 2020-03-03 NOTE — ED Triage Notes (Signed)
PT to ED via EMS from c/o abd pain. PT is a MWF dialysis pt and has missed her last 5 treatments. PTs abdomen is distended. PT breathing on room air.    169/94 74 100%RA

## 2020-03-03 NOTE — Progress Notes (Signed)
This note also relates to the following rows which could not be included: Pulse Rate - Cannot attach notes to unvalidated device data Resp - Cannot attach notes to unvalidated device data SpO2 - Cannot attach notes to unvalidated device data  Hd started  

## 2020-03-03 NOTE — Progress Notes (Signed)
Post dialysis treatment

## 2020-03-03 NOTE — ED Notes (Signed)
Recollect of light green tube sent to lab at this time for second troponin.

## 2020-03-03 NOTE — H&P (Signed)
History and Physical    Janet Olson YYT:[id number] DOB: 1970-02-27 DOA: 03/03/2020  PCP: Remi Haggard, FNP   Patient coming from: Home  I have personally briefly reviewed patient's old medical records in [place]  Chief Complaint: Abdominal distention                               Nausea  HPI: Janet Olson is a 50 y.o. female with medical history significant for  HFpEF, end-stage renal disease on hemodialysis (M/W/F), ongoing tobacco abuse, COPD, hypertension, GERD, and anemia of chronic disease presented to the emergency room for evaluation of increased abdominal girth, poor oral intake and nausea.  Patient was recently hospitalized about a month ago and at that time had significant ascites and had paracentesis done with removal of 2 L of fluid.  The fluid has reaccumulated has resulted in early satiety, poor oral intake and general feeling of unwell.  Patient states that she missed her dialysis appointments because she has not felt well and is requesting repeat paracentesis. She has been evaluated for ascites etiology is felt to be cardiorenal cause other than a primary hepatic cause. She complains of shortness of breath with exertion is having any chest pain, diaphoresis or palpitations.  She complains of nausea and abdominal discomfort but denies having any vomiting.  She also complains of constipation.  She has a cough which is productive of clear phlegm but denies having any fever or chills.  She will be admitted to the hospital for further evaluation     ED Course:  50 year old female who presented with above-stated history and physical exam consistent with tense ascites and volume overload in need of dialysis.  Patient was noted to have significantly elevated blood pressure in the emergency room. Patient discussed with Dr. Candiss Norse nephrologist who has arranged for the patient to receive dialysis today.  Chest x-ray consistent with increasing cardiomegaly  with interstitial edema.      Review of Systems: As per HPI otherwise 10 point review of systems negative.    Past Medical History:  Diagnosis Date  . (HFpEF) heart failure with preserved ejection fraction ([place])    a. 2015 Echo: Nl EF. Mild to mod LVH. Mild PR/MR; b. 04/2017 Echo: EF 55-60%, no rwma, Gr1 DD. Mildly dil LA. Small effusion; c. 12/2018 Echo: EF 55-60%, mildly dil LA/PA. Mod dil RA. Mild to mod RV dil. Mildly reduced RV fxn. Mild TR. Mod elev PASP.  Marland Kitchen Acute hypoxemic respiratory failure ([place]) 01/03/2019  . Acute renal failure (ARF) ([place]) 05/17/2017  . Acute respiratory failure ([place]) 09/25/2018  . Anemia   . Asthma   . Cardiomegaly   . COPD (chronic obstructive pulmonary disease) ([place])   . Dialysis patient ([place])    MON, WED , FRI  . Dyspnea   . ESRD (end stage renal disease) ([place])   . GERD (gastroesophageal reflux disease)   . Headache    Migraines  . Hypertension   . Nonrheumatic tricuspid (valve) insufficiency   . Respiratory failure with hypoxia ([place]) 04/21/2018  . Ulcer   . Vitamin D deficiency     Past Surgical History:  Procedure Laterality Date  . A/V FISTULAGRAM Left 04/17/2018   Procedure: A/V FISTULAGRAM;  Surgeon: Algernon Huxley, MD;  Location: [place] [facility];  Service: Cardiovascular;  Laterality: Left;  . A/V FISTULAGRAM Left 05/27/2018   Procedure: A/V FISTULAGRAM;  Surgeon: Katha Cabal,  MD;  Location: [place];  Service: Cardiovascular;  Laterality: Left;  . AV FISTULA PLACEMENT Left 08/08/2017   Procedure: ARTERIOVENOUS (AV) FISTULA CREATION ( BRACHIOCEPHALIC );  Surgeon: Algernon Huxley, MD;  Location: [facility] OR[facility];  Service: Vascular;  Laterality: Left;  . CESAREAN SECTION     x 4 (1988, 1991, 1997, 2000 )  . COLONOSCOPY WITH PROPOFOL N/A 04/24/2018   Procedure: COLONOSCOPY WITH PROPOFOL;  Surgeon: Lin Landsman, MD;  [facility];  Service: Gastroenterology;  Laterality: N/A;  . DIALYSIS/PERMA CATHETER INSERTION N/A  07/16/2017   Procedure: Dialysis/Perma Catheter Insertion;  Surgeon: Katha Cabal, MD;  Location: [place] [facility];  Service: Cardiovascular;  Laterality: N/A;  . DIALYSIS/PERMA CATHETER REMOVAL N/A 10/29/2017   Procedure: DIALYSIS/PERMA CATHETER REMOVAL;  Surgeon: Katha Cabal, MD;  Location: [place] [facility];  Service: Cardiovascular;  Laterality: N/A;  . excision of lymph nodes Bilateral 2014   bilateral under arms.  Marland Kitchen PARATHYROID IMPLANT REMOVAL       reports that she has been smoking cigarettes. She has a 15.00 pack-year smoking history. She has never used smokeless tobacco. She reports that she does not drink alcohol or use drugs.  No Known Allergies  Family History  Problem Relation Age of Onset  . Diabetes Mother      Prior to Admission medications   Medication Sig Start Date End Date Taking? Authorizing Provider  albuterol (VENTOLIN HFA) 108 (90 Base) MCG/ACT inhaler Inhale 2 puffs into the lungs every 6 (six) hours as needed for wheezing.  12/22/19  Yes [provider]  amLODipine (NORVASC) 10 MG tablet Take 1 tablet (10 mg total) by mouth daily. 06/12/17  Yes Doles-Johnson, Teah, NP  calcium acetate (PHOSLO) 667 MG capsule Take 2 capsules (1,334 mg total) by mouth 3 (three) times daily with meals. 09/27/18  Yes Wieting, Richard, MD  carvedilol (COREG) 12.5 MG tablet Take 1 tablet (12.5 mg total) by mouth 2 (two) times daily. 01/14/20 04/13/20 Yes Theora Gianotti, NP  DULERA 200-5 MCG/ACT AERO Inhale 2 puffs into the lungs 2 (two) times daily. 01/16/18  Yes [provider]  DULoxetine (CYMBALTA) 60 MG capsule Take 60 mg by mouth 2 (two) times daily.  12/05/17  Yes [provider]  hydrOXYzine (ATARAX/VISTARIL) 25 MG tablet Take 25 mg by mouth daily. 12/21/19  Yes [provider]  lidocaine-prilocaine (EMLA) cream Apply 1 application topically daily as needed [facility] access for dialysis).   Yes [provider]    metoprolol tartrate (LOPRESSOR) 50 MG tablet Take 50 mg by mouth 2 (two) times daily. 02/09/20  Yes [provider]  pantoprazole (PROTONIX) 40 MG tablet Take 1 tablet (40 mg total) by mouth daily. 12/20/18  Yes Schuyler Amor, MD  torsemide (DEMADEX) 100 MG tablet Take 1 tablet (100 mg total) by mouth daily. 09/27/18  Yes Wieting, Richard, MD  Vitamin D, Ergocalciferol, (DRISDOL) 50000 units CAPS capsule Take 50,000 Units by mouth every Sunday.  10/22/17  Yes [provider]  albuterol (PROVENTIL) (2.5 MG/3ML) 0.083% nebulizer solution Inhale into the lungs every 6 (six) hours as needed.     [provider]  albuterol (VENTOLIN HFA) 108 (90 Base) MCG/ACT inhaler Inhale into the lungs.    [provider]  benzonatate (TESSALON) 100 MG capsule Take 100 mg by mouth 2 (two) times daily as needed. 08/03/19   [provider]  chlorpheniramine-HYDROcodone ([place]) 10-8 MG/5ML SUER Take by mouth as needed.  [provider]  clotrimazole (MYCELEX) 10 MG troche TK 1 LOZENGE 5 TIMES D FOR 7 DAYS 02/12/19   [provider]  docusate sodium (COLACE) 100 MG capsule Take by mouth daily as needed.     [provider]  famotidine (PEPCID) 20 MG tablet Take 20 mg by mouth 2 (two) times daily. 12/19/19   [provider]  ibuprofen (ADVIL) 600 MG tablet Take by mouth.    [provider]  linaCLOtide (LINZESS PO) Take by mouth daily.    [provider]  magic mouthwash w/lidocaine SOLN Take 5 mLs by mouth 4 (four) times daily as needed for mouth pain. 02/02/19   Caryn Section Linden Dolin, PA-C  Melatonin 10 MG CAPS Take by mouth at bedtime as needed.     [provider]  multivitamin (RENA-VIT) TABS tablet Take 1 tablet by mouth daily. 08/23/17   [provider]  nicotine (NICODERM CQ - DOSED IN MG/24 HR) 7 mg/24hr patch Place onto the skin. Uses occasionally    [provider]  oxyCODONE (OXY  IR/ROXICODONE) 5 MG immediate release tablet Take by mouth as needed.  09/27/18   [provider]  ramelteon (ROZEREM) 8 MG tablet Take by mouth at bedtime.     [provider]  sucralfate (CARAFATE) 1 g tablet Take by mouth as needed.     [provider]  tiotropium (SPIRIVA) 18 MCG inhalation capsule Place into inhaler and inhale.    [provider]  triamcinolone cream (KENALOG) 0.5 % Apply topically as directed.     [provider]  zolpidem (AMBIEN) 5 MG tablet Take by mouth at bedtime as needed.     [provider]    Physical Exam: Vitals:   03/03/20 0853 03/03/20 0900 03/03/20 0930 03/03/20 1000  BP:  (!) 152/91 (!) 160/88 (!) 153/93  Pulse:  73 77 76  Resp:  19 13 13   Temp: 97.9 F (36.6 C)     TempSrc: Oral     SpO2:  92% 90%   Weight:      Height:         Vitals:   03/03/20 0853 03/03/20 0900 03/03/20 0930 03/03/20 1000  BP:  (!) 152/91 (!) 160/88 (!) 153/93  Pulse:  73 77 76  Resp:  19 13 13   Temp: 97.9 F (36.6 C)     TempSrc: Oral     SpO2:  92% 90%   Weight:      Height:        Constitutional: NAD, sleepy but arouses easily, chronically ill-appearing and has anasarca Eyes: PERRL, periorbital edema and conjunctivae pallor ENMT: Mucous membranes are moist.  Neck: normal, supple, no masses, no thyromegaly Respiratory: Bilateral air entry, no wheezing, crackles at the bases. Normal respiratory effort. No accessory muscle use.  Cardiovascular: Regular rate and rhythm, no murmurs / rubs / gallops. 3+ edema. 2+ pedal pulses. No carotid bruits.  Abdomen: Diffusely tender, increased abdominal girth, firm, Bowel sounds positive.  Musculoskeletal: no clubbing / cyanosis. No joint deformity upper and lower extremities.  Skin: no rashes, lesions, ulcers.  Neurologic: No gross focal neurologic deficit.  Appears weak Psychiatric: Normal mood and affect.   Labs on Admission: I have personally reviewed following labs  and imaging studies  CBC: Recent Labs  Lab 03/03/20 0616  WBC 5.7  HGB 12.3  HCT 38.0  MCV 83.2  PLT 989   Basic Metabolic Panel: Recent Labs  Lab 03/03/20 0700  NA 126*  K 6.1*  CL 89*  CO2 14*  GLUCOSE 53*  BUN 124*  CREATININE 12.55*  CALCIUM 8.5*   GFR: Estimated Creatinine Clearance: 5.6 mL/min (A) (by C-G formula based on SCr of 12.55 mg/dL (H)). Liver Function Tests: Recent Labs  Lab 03/03/20 0700  AST 21  ALT 21  ALKPHOS 191*  BILITOT 1.1  PROT 8.1  ALBUMIN 3.6   Recent Labs  Lab 03/03/20 0700  LIPASE 31   No results for input(s): AMMONIA in the last 168 hours. Coagulation Profile: No results for input(s): INR, PROTIME in the last 168 hours. Cardiac Enzymes: No results for input(s): CKTOTAL, CKMB, CKMBINDEX, TROPONINI in the last 168 hours. BNP (last 3 results) No results for input(s): PROBNP in the last 8760 hours. HbA1C: No results for input(s): HGBA1C in the last 72 hours. CBG: No results for input(s): GLUCAP in the last 168 hours. Lipid Profile: No results for input(s): CHOL, HDL, LDLCALC, TRIG, CHOLHDL, LDLDIRECT in the last 72 hours. Thyroid Function Tests: No results for input(s): TSH, T4TOTAL, FREET4, T3FREE, THYROIDAB in the last 72 hours. Anemia Panel: No results for input(s): VITAMINB12, FOLATE, FERRITIN, TIBC, IRON, RETICCTPCT in the last 72 hours. Urine analysis:    Component Value Date/Time   COLORURINE AMBER (A) 02/21/2018 2232   APPEARANCEUR CLOUDY (A) 02/21/2018 2232   LABSPEC 1.029 02/21/2018 2232   PHURINE 6.0 02/21/2018 2232   GLUCOSEU 150 (A) 02/21/2018 2232   HGBUR NEGATIVE 02/21/2018 2232   BILIRUBINUR NEGATIVE 02/21/2018 2232   KETONESUR NEGATIVE 02/21/2018 2232   PROTEINUR 100 (A) 02/21/2018 2232   NITRITE NEGATIVE 02/21/2018 2232   LEUKOCYTESUR NEGATIVE 02/21/2018 2232    Radiological Exams on Admission: DG Chest Portable 1 View  Result Date: 03/03/2020 CLINICAL DATA:  Dyspnea EXAM: PORTABLE CHEST 1 VIEW  COMPARISON:  February 02, 2019 FINDINGS: There is slight interval increase in the cardiomegaly. There is diffusely increased interstitial markings seen throughout both lungs. No acute osseous abnormality. IMPRESSION: Slight interval increase in the cardiomegaly and findings suggestive of interstitial edema. Electronically Signed   By: Prudencio Pair M.D.   On: 03/03/2020 06:31    EKG: Independently reviewed.  Sinus rhythm  Assessment/Plan Principal Problem:   Acute on chronic diastolic CHF (congestive heart failure) (HCC) Active Problems:   Anemia in chronic kidney disease   ESRD on dialysis ([place])   Anasarca associated with disorder of kidney   Hyponatremia with excess extracellular fluid volume   Hyperkalemia    Acute on chronic diastolic dysfunction CHF Secondary to missed hemodialysis treatment Patient had missed about 5 dialysis treatments so she said she did not feel well She presented for evaluation of shortness of breath and chest x-ray shows interstitial edema Patient will require emergent renal replacement therapy Continue torsemide and optimize blood pressure control   Anasarca associated with disorder of kidney She has diffuse, generalized body swelling with associated ascites She status post paracentesis which was done about a month ago She has an increased abdominal girth and would require paracentesis during this hospitalization Will consult interventional radiology for paracentesis    End-stage renal disease on hemodialysis Patient requires emergent renal replacement therapy due to hyperkalemia and fluid overload Will consult nephrology Expect improvement in potassium levels following renal replacement therapy   Hyponatremia Secondary to volume overload Expect improvement in patient's serum sodium levels following renal replacement therapy   Anemia in end-stage renal disease H&H is stable   Hypertension Optimize blood pressure control Continue amlodipine  and carvedilol  Nicotine dependence Smoking cessation has been discussed with patient in detail Place patient on a nicotine transdermal patch 7 mcg daily  DVT prophylaxis: Heparin Code Status: Full code Family Communication: Discussed plan of care with patient in detail she verbalizes understanding and agrees with the plan Disposition Plan: Back to previous home environment Consults called: Nephrology, IR    Collier Bullock MD [facility]     03/03/2020, 10:48 AM

## 2020-03-03 NOTE — Progress Notes (Signed)
Hemodialysis patient known at [facility] ([place]) MWF 12:00pm. Patient normally drives self to treatments. Per clinic patient has missed several treatments, which is usual for this patient. However, patient may miss once in a while or sign off machine early. Please contact me with any dialysis placement concerns.

## 2020-03-03 NOTE — Progress Notes (Signed)
This note also relates to the following rows which could not be included: Pulse Rate - Cannot attach notes to unvalidated device data Resp - Cannot attach notes to unvalidated device data  Hd completed  

## 2020-03-03 NOTE — ED Provider Notes (Signed)
[facility] Emergency Department Provider Note  ____________________________________________   First MD Initiated Contact with Patient 03/03/20 [phone]     (approximate)  I have reviewed the triage vital signs and the nursing notes.   HISTORY  Chief Complaint Abdominal Pain    HPI Janet Olson is a 50 y.o. female with below list of previous medical conditions including end-stage renal disease for which the patient receives dialysis Monday Wednesday and Friday, COPD, cardiomegaly, hypertension and ascites presents to the emergency department via EMS secondary to increasing abdominal distention with associated nausea.  Patient also states that she has not had dialysis for the past 5 days secondary to beforementioned.  Patient does admit to abdominal discomfort which she describes as "it just feels really tight".    Past Medical History:  Diagnosis Date  . (HFpEF) heart failure with preserved ejection fraction ([place])    a. 2015 Echo: Nl EF. Mild to mod LVH. Mild PR/MR; b. 04/2017 Echo: EF 55-60%, no rwma, Gr1 DD. Mildly dil LA. Small effusion; c. 12/2018 Echo: EF 55-60%, mildly dil LA/PA. Mod dil RA. Mild to mod RV dil. Mildly reduced RV fxn. Mild TR. Mod elev PASP.  Marland Kitchen Acute hypoxemic respiratory failure ([place]) 01/03/2019  . Acute renal failure (ARF) ([place]) 05/17/2017  . Acute respiratory failure ([place]) 09/25/2018  . Anemia   . Asthma   . Cardiomegaly   . COPD (chronic obstructive pulmonary disease) ([place])   . Dialysis patient ([place])    MON, WED , FRI  . Dyspnea   . ESRD (end stage renal disease) ([place])   . GERD (gastroesophageal reflux disease)   . Headache    Migraines  . Hypertension   . Nonrheumatic tricuspid (valve) insufficiency   . Respiratory failure with hypoxia ([place]) 04/21/2018  . Ulcer   . Vitamin D deficiency     Patient Active Problem List   Diagnosis Date Noted  . Renal dialysis device, implant, or graft complication 62/83/6629  . ESRD  on dialysis ([place]) 08/02/2017  . COPD (chronic obstructive pulmonary disease) ([place]) 06/20/2017  . Anemia in chronic kidney disease 06/07/2017  . 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 Mass of left axilla 12/07/2013    Past Surgical History:  Procedure Laterality Date  . A/V FISTULAGRAM Left 04/17/2018   Procedure: A/V FISTULAGRAM;  Surgeon: Algernon Huxley, MD;  Location: [place] [facility];  Service: Cardiovascular;  Laterality: Left;  . A/V FISTULAGRAM Left 05/27/2018   Procedure: A/V FISTULAGRAM;  Surgeon: Katha Cabal, MD;  Location: [place] [facility];  Service: Cardiovascular;  Laterality: Left;  . AV FISTULA PLACEMENT Left 08/08/2017   Procedure: ARTERIOVENOUS (AV) FISTULA CREATION ( BRACHIOCEPHALIC );  Surgeon: Algernon Huxley, MD;  Location: [facility] ORS;  Service: Vascular;  Laterality: Left;  . CESAREAN SECTION     x 4 (1988, 1991, 1997, 2000 )  . COLONOSCOPY WITH PROPOFOL N/A 04/24/2018   Procedure: COLONOSCOPY WITH PROPOFOL;  Surgeon: Lin Landsman, MD;  Location: [facility] ENDOSCOPY;  Service: Gastroenterology;  Laterality: N/A;  . DIALYSIS/PERMA CATHETER INSERTION N/A 07/16/2017   Procedure: Dialysis/Perma Catheter Insertion;  Surgeon: Katha Cabal, MD;  Location: [place] [facility];  Service: Cardiovascular;  Laterality: N/A;  . DIALYSIS/PERMA CATHETER REMOVAL N/A 10/29/2017   Procedure: DIALYSIS/PERMA CATHETER REMOVAL;  Surgeon: Katha Cabal, MD;  Location: [place] [facility];  Service: Cardiovascular;  Laterality: N/A;  . excision of lymph nodes Bilateral 2014   bilateral under arms.  Marland Kitchen  PARATHYROID IMPLANT REMOVAL      Prior to Admission medications   Medication Sig Start Date End Date Taking? Authorizing Provider  albuterol (PROVENTIL) (2.5 MG/3ML) 0.083% nebulizer solution Inhale into the lungs every 6 (six) hours as needed.     [provider]  albuterol (VENTOLIN HFA) 108 (90 Base) MCG/ACT inhaler Inhale into the lungs.    [provider]  albuterol (VENTOLIN HFA) 108 (90 Base) MCG/ACT inhaler SMARTSIG:2 Puff(s) By Mouth Every 4-6 Hours PRN 12/22/19   [provider]  amLODipine (NORVASC) 10 MG tablet Take 1 tablet (10 mg total) by mouth daily. 06/12/17   Doles-Johnson, Teah, NP  benzonatate (TESSALON) 100 MG capsule Take 100 mg by mouth 2 (two) times daily as needed. 08/03/19   [provider]  calcium acetate (PHOSLO) 667 MG capsule Take 2 capsules (1,334 mg total) by mouth 3 (three) times daily with meals. 09/27/18   Loletha Grayer, MD  carvedilol (COREG) 12.5 MG tablet Take 1 tablet (12.5 mg total) by mouth 2 (two) times daily. Patient not taking: Reported on 02/11/2020 01/14/20 04/13/20  Theora Gianotti, NP  chlorpheniramine-HYDROcodone (Agency) 10-8 MG/5ML SUER Take by mouth as needed.     [provider]  clotrimazole (MYCELEX) 10 MG troche TK 1 LOZENGE 5 TIMES D FOR 7 DAYS 02/12/19   [provider]  docusate sodium (COLACE) 100 MG capsule Take by mouth daily as needed.     [provider]  DULERA 200-5 MCG/ACT AERO Inhale 2 puffs into the lungs 2 (two) times daily. 01/16/18   [provider]  DULoxetine (CYMBALTA) 60 MG capsule Take 60 mg by mouth 2 (two) times daily.  12/05/17   [provider]  famotidine (PEPCID) 20 MG tablet Take 20 mg by mouth 2 (two) times daily. 12/19/19   [provider]  hydrOXYzine (ATARAX/VISTARIL) 25 MG tablet Take 25 mg by mouth daily. 12/21/19   [provider]  ibuprofen (ADVIL) 600 MG tablet Take by mouth.    [provider]  lidocaine-prilocaine (EMLA) cream Apply 1 application topically daily as needed [facility] access for dialysis).    [provider]  linaCLOtide (LINZESS PO) Take by mouth daily.    [provider]  magic mouthwash w/lidocaine SOLN Take 5 mLs by mouth 4 (four) times daily as needed for mouth pain. 02/02/19   Caryn Section Linden Dolin, PA-C  Melatonin 10 MG CAPS  Take by mouth at bedtime as needed.     [provider]  metoprolol tartrate (LOPRESSOR) 50 MG tablet Take 50 mg by mouth 2 (two) times daily. 02/09/20   [provider]  multivitamin (RENA-VIT) TABS tablet Take 1 tablet by mouth daily. 08/23/17   [provider]  nicotine (NICODERM CQ - DOSED IN MG/24 HR) 7 mg/24hr patch Place onto the skin. Uses occasionally    [provider]  oxyCODONE (OXY IR/ROXICODONE) 5 MG immediate release tablet Take by mouth as needed.  09/27/18   [provider]  pantoprazole (PROTONIX) 40 MG tablet Take 1 tablet (40 mg total) by mouth daily. 12/20/18   Schuyler Amor, MD  ramelteon (ROZEREM) 8 MG tablet Take by mouth at bedtime.     [provider]  sucralfate (CARAFATE) 1 g tablet Take by mouth as needed.     [provider]  tiotropium (SPIRIVA) 18 MCG inhalation capsule Place into inhaler and inhale.    [provider]  torsemide (DEMADEX) 100 MG tablet Take 1 tablet (100  mg total) by mouth daily. 09/27/18   Loletha Grayer, MD  triamcinolone cream (KENALOG) 0.5 % Apply topically as directed.     [provider]  Vitamin D, Ergocalciferol, (DRISDOL) 50000 units CAPS capsule Take 50,000 Units by mouth every Sunday.  10/22/17   [provider]  zolpidem (AMBIEN) 5 MG tablet Take by mouth at bedtime as needed.     [provider]    Allergies Patient has no known allergies.  Family History  Problem Relation Age of Onset  . Diabetes Mother     Social History Social History   Tobacco Use  . Smoking status: Current Every Day Smoker    Packs/day: 0.50    Years: 30.00    Pack years: 15.00    Types: Cigarettes  . Smokeless tobacco: Never Used  Substance Use Topics  . Alcohol use: No  . Drug use: No    Review of Systems Constitutional: No fever/chills Eyes: No visual changes. ENT: No sore throat. Cardiovascular: Denies chest pain. Respiratory: Denies  shortness of breath. Gastrointestinal: Positive for abdominal distention and nausea..  No diarrhea.  No constipation. Genitourinary: Negative for dysuria. Musculoskeletal: Negative for neck pain.  Negative for back pain. Integumentary: Negative for rash. Neurological: Negative for headaches, focal weakness or numbness.  ____________________________________________   PHYSICAL EXAM:  VITAL SIGNS: ED Triage Vitals  Enc Vitals Group     BP --      Pulse Rate 03/03/20 0615 73     Resp 03/03/20 0615 13     Temp --      Temp src --      SpO2 03/03/20 0615 95 %     Weight 03/03/20 0611 77.1 kg (170 lb)     Height 03/03/20 0611 1.676 m (5\' 6" )     Head Circumference --      Peak Flow --      Pain Score 03/03/20 0611 8     Pain Loc --      Pain Edu? --      Excl. in [place]? --     Constitutional: Alert and oriented. Eyes: Conjunctivae are normal.  Mouth/Throat: Patient is wearing a mask. Neck: No stridor.  No meningeal signs.   Cardiovascular: Normal rate, regular rhythm. Good peripheral circulation. Grossly normal heart sounds. Respiratory: Tachypnea bibasilar rales Gastrointestinal: Soft and nontender.  Positive for abdominal distention.  Positive fluid wave  musculoskeletal: No lower extremity tenderness nor edema. No gross deformities of extremities. Neurologic:  Normal speech and language. No gross focal neurologic deficits are appreciated.  Skin:  Skin is warm, dry and intact. Psychiatric: Mood and affect are normal. Speech and behavior are normal.  ____________________________________________   LABS (all labs ordered are listed, but only abnormal results are displayed)  Labs Reviewed  CBC - Abnormal; Notable for the following components:      Result Value   RDW 15.9 (*)    All other components within normal limits  COMPREHENSIVE METABOLIC PANEL  LIPASE, BLOOD  TROPONIN I (HIGH SENSITIVITY)   ____________________________________________  EKG  ED ECG REPORT I,  [place] Fred Franzen, the attending physician, personally viewed and interpreted this ECG.   Date: 03/03/2020  EKG Time: 6:13 AM   Rate: 73  Rhythm: Sinus rhythm  Axis: Normal  Intervals: Prolonged PR  ST&T Change: None  ____________________________________________  RADIOLOGY I, [place] Jacklyn Branan, personally viewed and evaluated these images (plain radiographs) as part of my medical decision making, as well as reviewing the written report  by the radiologist.  ED MD interpretation: Slight interval increase in cardiomegaly with findings suggestive of interstitial edema chest x-ray interpretation per radiologist.  Official radiology report(s): DG Chest Portable 1 View  Result Date: 03/03/2020 CLINICAL DATA:  Dyspnea EXAM: PORTABLE CHEST 1 VIEW COMPARISON:  February 02, 2019 FINDINGS: There is slight interval increase in the cardiomegaly. There is diffusely increased interstitial markings seen throughout both lungs. No acute osseous abnormality. IMPRESSION: Slight interval increase in the cardiomegaly and findings suggestive of interstitial edema. Electronically Signed   By: Prudencio Pair M.D.   On: 03/03/2020 06:31     Procedures   ____________________________________________   INITIAL IMPRESSION / MDM / [place] / ED COURSE  As part of my medical decision making, I reviewed the following data within the electronic MEDICAL RECORD NUMBER   50 year old female presented with above-stated history and physical exam consistent with tense ascites and volume overload in need of dialysis.  Patient discussed with Dr. Candiss Norse nephrologist who will arrange for the patient to receive dialysis today.  Chest x-ray consistent with increasing cardiomegaly with interstitial edema.  Patient's care transferred to Dr. Corky Downs     ____________________________________________  FINAL CLINICAL IMPRESSION(S) / ED DIAGNOSES  Final diagnoses:  Hyperkalemia  Acute pulmonary edema ([place])  Other ascites      MEDICATIONS GIVEN DURING THIS VISIT:  Medications  morphine 2 MG/ML injection 2 mg (has no administration in time range)  ondansetron (ZOFRAN) injection 4 mg (4 mg Intravenous Given 03/03/20 8299)     ED Discharge Orders    None      *Please note:  Janet Olson was evaluated in Emergency Department on 03/03/2020 for the symptoms described in the history of present illness. She was evaluated in the context of the global COVID-19 pandemic, which necessitated consideration that the patient might be at risk for infection with the SARS-CoV-2 virus that causes COVID-19. Institutional protocols and algorithms that pertain to the evaluation of patients at risk for COVID-19 are in a state of rapid change based on information released by regulatory bodies including the CDC and federal and state organizations. These policies and algorithms were followed during the patient's care in the ED.  Some ED evaluations and interventions may be delayed as a result of limited staffing during the pandemic.*  Note:  This document was prepared using Dragon voice recognition software and may include unintentional dictation errors.   Gregor Hams, MD 03/04/20 [phone]

## 2020-03-03 NOTE — Progress Notes (Signed)
[facility], [place] 03/03/20  Subjective:   LOS: 0 No intake/output data recorded. Patient presents to ER via EMS for abdominal pain, Shortness of breath Reports decreased oral intake Concerned about hemorrhoids Missed at least 3 treatments. Last HD was feb 24   Objective:  Vital signs in last 24 hours:  Temp:  [97.9 F (36.6 C)] 97.9 F (36.6 C) (03/04 0853) Pulse Rate:  [71-78] 76 (03/04 1000) Resp:  [13-19] 13 (03/04 1000) BP: (132-160)/(74-93) 153/93 (03/04 1000) SpO2:  [90 %-95 %] 90 % (03/04 0930) Weight:  [77.1 kg] 77.1 kg (03/04 0611)  Weight change:  Filed Weights   03/03/20 0611  Weight: 77.1 kg    Intake/Output:   No intake or output data in the 24 hours ending 03/03/20 1052   Physical Exam: General: NAD, laying in bed  HEENT Moist oral mucus membranes  Pulm/lungs B/l coarse breath sounds and basilar crackles  CVS/Heart No rub  Abdomen:  Soft, non tender  Extremities: + LE edema  Neurologic: Alert, oreinted  Skin: warm  Access: Aneurysmal AVF       Basic Metabolic Panel:  Recent Labs  Lab 03/03/20 0700  NA 126*  K 6.1*  CL 89*  CO2 14*  GLUCOSE 53*  BUN 124*  CREATININE 12.55*  CALCIUM 8.5*     CBC: Recent Labs  Lab 03/03/20 0616  WBC 5.7  HGB 12.3  HCT 38.0  MCV 83.2  PLT 255      Lab Results  Component Value Date   HEPBSAG Negative 07/11/2017      Microbiology:  Recent Results (from the past 240 hour(s))  Respiratory Panel by RT PCR (Flu A&B, Covid) - Nasopharyngeal Swab     Status: None   Collection Time: 03/03/20  8:53 AM   Specimen: Nasopharyngeal Swab  Result Value Ref Range Status   SARS Coronavirus 2 by RT PCR NEGATIVE NEGATIVE Final    Comment: (NOTE) SARS-CoV-2 target nucleic acids are NOT DETECTED. The SARS-CoV-2 RNA is generally detectable in upper respiratoy specimens during the acute phase of infection. The lowest concentration of SARS-CoV-2 viral copies this assay can  detect is 131 copies/mL. A negative result does not preclude SARS-Cov-2 infection and should not be used as the sole basis for treatment or other patient management decisions. A negative result may occur with  improper specimen collection/handling, submission of specimen other than nasopharyngeal swab, presence of viral mutation(s) within the areas targeted by this assay, and inadequate number of viral copies (<131 copies/mL). A negative result must be combined with clinical observations, patient history, and epidemiological information. The expected result is Negative. Fact Sheet for Patients:  PinkCheek.be Fact Sheet for Healthcare Providers:  GravelBags.it This test is not yet ap proved or cleared by the [place] FDA and  has been authorized for detection and/or diagnosis of SARS-CoV-2 by FDA under an Emergency Use Authorization (EUA). This EUA will remain  in effect (meaning this test can be used) for the duration of the COVID-19 declaration under Section 564(b)(1) of the Act, 21 U.S.C. section 360bbb-3(b)(1), unless the authorization is terminated or revoked sooner.    Influenza A by PCR NEGATIVE NEGATIVE Final   Influenza B by PCR NEGATIVE NEGATIVE Final    Comment: (NOTE) The Xpert Xpress SARS-CoV-2/FLU/RSV assay is intended as an aid in  the diagnosis of influenza from Nasopharyngeal swab specimens and  should not be used as a sole basis for treatment. Nasal washings and  aspirates are unacceptable for  Xpert Xpress SARS-CoV-2/FLU/RSV  testing. Fact Sheet for Patients: PinkCheek.be Fact Sheet for Healthcare Providers: GravelBags.it This test is not yet approved or cleared by the [place] FDA and  has been authorized for detection and/or diagnosis of SARS-CoV-2 by  FDA under an Emergency Use Authorization (EUA). This EUA will remain  in effect (meaning  this test can be used) for the duration of the  Covid-19 declaration under Section 564(b)(1) of the Act, 21  U.S.C. section 360bbb-3(b)(1), unless the authorization is  terminated or revoked. Performed at [facility], [address] [place], [place] [address]     Coagulation Studies: No results for input(s): LABPROT, INR in the last 72 hours.  Urinalysis: No results for input(s): COLORURINE, LABSPEC, PHURINE, GLUCOSEU, HGBUR, BILIRUBINUR, KETONESUR, PROTEINUR, UROBILINOGEN, NITRITE, LEUKOCYTESUR in the last 72 hours.  Invalid input(s): APPERANCEUR    Imaging: DG Chest Portable 1 View  Result Date: 03/03/2020 CLINICAL DATA:  Dyspnea EXAM: PORTABLE CHEST 1 VIEW COMPARISON:  February 02, 2019 FINDINGS: There is slight interval increase in the cardiomegaly. There is diffusely increased interstitial markings seen throughout both lungs. No acute osseous abnormality. IMPRESSION: Slight interval increase in the cardiomegaly and findings suggestive of interstitial edema. Electronically Signed   By: Prudencio Pair M.D.   On: 03/03/2020 06:31     Medications:   . sodium chloride     . amLODipine  10 mg Oral Daily  . aspirin EC  81 mg Oral Daily  . calcium acetate  1,334 mg Oral TID WC  . carvedilol  12.5 mg Oral BID  . Chlorhexidine Gluconate Cloth  6 each Topical Q0600  . clotrimazole  10 mg Oral 5 X Daily  . DULoxetine  60 mg Oral BID  . famotidine  10 mg Oral Daily  . heparin  5,000 Units Subcutaneous Q8H  . hydrOXYzine  25 mg Oral Daily  . linaclotide  145 mcg Oral Daily  . mometasone-formoterol  2 puff Inhalation BID  . multivitamin  1 tablet Oral Daily  . nicotine  7 mg Transdermal Daily  . pantoprazole  40 mg Oral Daily  . sodium chloride flush  3 mL Intravenous Q12H  . sucralfate  1 g Oral TID WC & HS  . tiotropium  18 mcg Inhalation Daily  . torsemide  100 mg Oral Daily  . triamcinolone cream   Topical BID  . [START ON 03/06/2020] Vitamin D (Ergocalciferol)   50,000 Units Oral Q Sun   sodium chloride, acetaminophen, albuterol, benzonatate, docusate sodium, lidocaine-prilocaine, magic mouthwash w/lidocaine, ondansetron (ZOFRAN) IV, oxyCODONE, sodium chloride flush, zolpidem  Assessment/ Plan:  50 y.o. female with end-stage renal disease, COPD, hypertension   Principal Problem:   Acute on chronic diastolic CHF (congestive heart failure) (HCC) Active Problems:   Anemia in chronic kidney disease   ESRD on dialysis ([place])   Anasarca associated with disorder of kidney   Hyponatremia with excess extracellular fluid volume   Hyperkalemia   #. Hyperkalemia in setting of ESRD  Missed 3 treatments. Last treatment on Feb 24 Plan for HD today and tomorrow to get back on routine schedule Orders prepared and nursing staff alerted Potassium expected to correct with HD  #. Anemia of CKD  Lab Results  Component Value Date   HGB 12.3 03/03/2020  Hb acceptable. Hold EPO   #. [facility]     Component Value Date/Time   PTH 73 (H) 01/03/2019 0243   PTH Comment 01/03/2019 0243   Lab Results  Component Value Date  PHOS 3.1 01/03/2019   Monitor calcium and phos level during this admission  #. HTN, LE Edema Currently on room air Volume removal with HD as tolerated    LOS: 0 Ahmani Daoud Candiss Norse 3/4/202110:52 [place], [place]

## 2020-03-03 NOTE — Procedures (Signed)
PROCEDURE SUMMARY:  Successful US guided paracentesis from RLQ.  Yielded 7.5 L of clear dark yellow fluid.  No immediate complications.  Pt tolerated well.   Specimen was not sent for labs.  EBL < 49mL  Ascencion Dike PA-C 03/03/2020 2:59 PM

## 2020-03-04 DIAGNOSIS — I5033 Acute on chronic diastolic (congestive) heart failure: Secondary | ICD-10-CM

## 2020-03-04 DIAGNOSIS — N186 End stage renal disease: Secondary | ICD-10-CM | POA: Diagnosis not present

## 2020-03-04 DIAGNOSIS — R188 Other ascites: Secondary | ICD-10-CM | POA: Diagnosis not present

## 2020-03-04 DIAGNOSIS — I132 Hypertensive heart and chronic kidney disease with heart failure and with stage 5 chronic kidney disease, or end stage renal disease: Secondary | ICD-10-CM | POA: Diagnosis not present

## 2020-03-04 DIAGNOSIS — N049 Nephrotic syndrome with unspecified morphologic changes: Secondary | ICD-10-CM

## 2020-03-04 LAB — BASIC METABOLIC PANEL WITH GFR
Anion gap: 16 — ABNORMAL HIGH (ref 5–15)
BUN: 76 mg/dL — ABNORMAL HIGH (ref 6–20)
CO2: 24 mmol/L (ref 22–32)
Calcium: 8 mg/dL — ABNORMAL LOW (ref 8.9–10.3)
Chloride: 95 mmol/L — ABNORMAL LOW (ref 98–111)
Creatinine, Ser: 9.63 mg/dL — ABNORMAL HIGH (ref 0.44–1.00)
GFR calc Af Amer: 5 mL/min — ABNORMAL LOW
GFR calc non Af Amer: 4 mL/min — ABNORMAL LOW
Glucose, Bld: 91 mg/dL (ref 70–99)
Potassium: 4.4 mmol/L (ref 3.5–5.1)
Sodium: 135 mmol/L (ref 135–145)

## 2020-03-04 LAB — HEPATITIS B SURFACE ANTIGEN: Hepatitis B Surface Ag: NONREACTIVE

## 2020-03-04 MED ORDER — NEPRO/CARBSTEADY PO LIQD: 237.0000 mL | Freq: Two times a day (BID) | ORAL | 12 refills | Status: DC

## 2020-03-04 MED ORDER — NEPRO/CARBSTEADY PO LIQD
237.0000 mL | Freq: Two times a day (BID) | ORAL | Status: DC
  Administered 2020-03-04: 237 mL via ORAL

## 2020-03-04 NOTE — Progress Notes (Signed)
Pre HD Tx   03/04/20 0804  Hand-Off documentation  Report given to (Full Name) Beatris Ship, RN   Report received from (Full Name) Amy, RN   Vital Signs  Temp 97.8 F (36.6 C)  Temp Source Oral  Pulse Rate 87  Pulse Rate Source Monitor  Resp 16  BP (!) 142/89  BP Location Right Arm  BP Method Automatic  Patient Position (if appropriate) Lying  Oxygen Therapy  SpO2 97 %  O2 Device Room Air  Pulse Oximetry Type Continuous  Pain Assessment  Pain Scale 0-10  Pain Score 0  Dialysis Weight  Weight 82.3 kg  Type of Weight Pre-Dialysis  Time-Out for Hemodialysis  What Procedure? HD  Pt Identifiers(min of two) First/Last Name;MRN/Account#  Correct Site? Yes  Correct Side? Yes  Correct Procedure? Yes  Consents Verified? Yes  Safety Precautions Reviewed? Yes  Engineer, civil (consulting) Number 6  Station Number 3  UF/Alarm Test Passed  Conductivity: Meter 14  Conductivity: Machine  14  pH 7.2  Reverse Osmosis Main  Normal Saline Lot Number [id number]  Dialyzer Lot Number [id number]  Disposable Set Lot Number [id number]  Machine Temperature 98.6 F (37 C)  Musician and Audible Yes  Blood Lines Intact and Secured Yes  Pre Treatment Patient Checks  Vascular access used during treatment Fistula  Hepatitis B Surface Antigen Results Negative  Date Hepatitis B Surface Antigen Drawn 03/04/20  Hepatitis B Surface Antibody  (>10)  Date Hepatitis B Surface Antibody Drawn 10/12/19  Hemodialysis Consent Verified Yes  Hemodialysis Standing Orders Initiated Yes  ECG (Telemetry) Monitor On Yes  Prime Ordered Normal Saline  Length of  DialysisTreatment -hour(s) 3.5 Hour(s)  Dialysis Treatment Comments Na 140  Dialyzer Elisio 17H NR  Dialysate 2K;2.5 Ca  Dialysis Anticoagulant None  Dialysate Flow Ordered 600  Blood Flow Rate Ordered 400 mL/min  Ultrafiltration Goal 1.5 Liters  Pre Treatment Labs Renal panel;Hepatitis B Surface Antigen;CBC  Dialysis Blood Pressure Support Ordered  Normal Saline  Education / Care Plan  Dialysis Education Provided Yes  Documented Education in Care Plan Yes  Fistula / Graft Left Upper arm Arteriovenous fistula  Placement Date: 08/08/17   Placed prior to admission: Yes  Orientation: Left  Access Location: Upper arm  Access Type: Arteriovenous fistula  Site Condition No complications  Fistula / Graft Assessment Present;Thrill;Bruit  Status Patent  Drainage Description None

## 2020-03-04 NOTE — Progress Notes (Signed)
[facility], [place] 03/04/20  Subjective:   LOS: 1 03/04 0701 - 03/05 0700 In: -  Out: 2500  Feels well this morning 2500 cc was removed with dialysis yesterday Repeat hemodialysis today to get her back on schedule and possibly remove more fluid   HEMODIALYSIS FLOWSHEET:  Blood Flow Rate (mL/min): 350 mL/min Arterial Pressure (mmHg): -150 mmHg Venous Pressure (mmHg): 200 mmHg Transmembrane Pressure (mmHg): 80 mmHg Ultrafiltration Rate (mL/min): 1000 mL/min Dialysate Flow Rate (mL/min): 500 ml/min Conductivity: Machine : 14 Conductivity: Machine : 14 Dialysis Fluid Bolus: Normal Saline Bolus Amount (mL): 250 mL    Objective:  Vital signs in last 24 hours:  Temp:  [97.8 F (36.6 C)-98.7 F (37.1 C)] 97.8 F (36.6 C) (03/05 0804) Pulse Rate:  [86-102] 99 (03/05 1000) Resp:  [13-30] 26 (03/05 1000) BP: (111-161)/(67-100) 113/69 (03/05 1000) SpO2:  [87 %-97 %] 94 % (03/05 1000) Weight:  [82.3 kg] 82.3 kg (03/05 0804)  Weight change: 6.039 kg Filed Weights   03/03/20 1041 03/04/20 0236 03/04/20 0804  Weight: 83.2 kg 82.3 kg 82.3 kg    Intake/Output:    Intake/Output Summary (Last 24 hours) at 03/04/2020 1127 Last data filed at 03/04/2020 0043 Gross per 24 hour  Intake --  Output 2500 ml  Net -2500 ml     Physical Exam: General: NAD, laying in bed  HEENT Moist oral mucus membranes  Pulm/lungs  mildly decreased breath sounds at bases, room air  CVS/Heart No rub  Abdomen:  Soft, non tender  Extremities: + LE edema  Neurologic: Alert, oriented  Skin: warm  Access: Aneurysmal AVF       Basic Metabolic Panel:  Recent Labs  Lab 03/03/20 0700 03/03/20 1041 03/04/20 0713  NA 126*  --  135  K 6.1*  --  4.4  CL 89*  --  95*  CO2 14*  --  24  GLUCOSE 53*  --  91  BUN 124*  --  76*  CREATININE 12.55*  --  9.63*  CALCIUM 8.5*  --  8.0*  PHOS  --  6.4*  --      CBC: Recent Labs  Lab 03/03/20 0616 03/03/20 1041  WBC 5.7  4.1  HGB 12.3 11.4*  HCT 38.0 33.9*  MCV 83.2 81.7  PLT 255 223      Lab Results  Component Value Date   HEPBSAG Negative 07/11/2017      Microbiology:  Recent Results (from the past 240 hour(s))  Respiratory Panel by RT PCR (Flu A&B, Covid) - Nasopharyngeal Swab     Status: None   Collection Time: 03/03/20  8:53 AM   Specimen: Nasopharyngeal Swab  Result Value Ref Range Status   SARS Coronavirus 2 by RT PCR NEGATIVE NEGATIVE Final    Comment: (NOTE) SARS-CoV-2 target nucleic acids are NOT DETECTED. The SARS-CoV-2 RNA is generally detectable in upper respiratoy specimens during the acute phase of infection. The lowest concentration of SARS-CoV-2 viral copies this assay can detect is 131 copies/mL. A negative result does not preclude SARS-Cov-2 infection and should not be used as the sole basis for treatment or other patient management decisions. A negative result may occur with  improper specimen collection/handling, submission of specimen other than nasopharyngeal swab, presence of viral mutation(s) within the areas targeted by this assay, and inadequate number of viral copies (<131 copies/mL). A negative result must be combined with clinical observations, patient history, and epidemiological information. The expected result is Negative. Fact Sheet for  Patients:  PinkCheek.be Fact Sheet for Healthcare Providers:  GravelBags.it This test is not yet ap proved or cleared by the [place] and  has been authorized for detection and/or diagnosis of SARS-CoV-2 by FDA under an Emergency Use Authorization (EUA). This EUA will remain  in effect (meaning this test can be used) for the duration of the COVID-19 declaration under Section 564(b)(1) of the Act, 21 U.S.C. section 360bbb-3(b)(1), unless the authorization is terminated or revoked sooner.    Influenza A by PCR NEGATIVE NEGATIVE Final   Influenza B by PCR  NEGATIVE NEGATIVE Final    Comment: (NOTE) The Xpert Xpress SARS-CoV-2/FLU/RSV assay is intended as an aid in  the diagnosis of influenza from Nasopharyngeal swab specimens and  should not be used as a sole basis for treatment. Nasal washings and  aspirates are unacceptable for Xpert Xpress SARS-CoV-2/FLU/RSV  testing. Fact Sheet for Patients: PinkCheek.be Fact Sheet for Healthcare Providers: GravelBags.it This test is not yet approved or cleared by the [place] FDA and  has been authorized for detection and/or diagnosis of SARS-CoV-2 by  FDA under an Emergency Use Authorization (EUA). This EUA will remain  in effect (meaning this test can be used) for the duration of the  Covid-19 declaration under Section 564(b)(1) of the Act, 21  U.S.C. section 360bbb-3(b)(1), unless the authorization is  terminated or revoked. Performed at [facility], [address] [place], [place] [address]     Coagulation Studies: No results for input(s): LABPROT, INR in the last 72 hours.  Urinalysis: No results for input(s): COLORURINE, LABSPEC, PHURINE, GLUCOSEU, HGBUR, BILIRUBINUR, KETONESUR, PROTEINUR, UROBILINOGEN, NITRITE, LEUKOCYTESUR in the last 72 hours.  Invalid input(s): APPERANCEUR    Imaging: US Paracentesis  Result Date: 03/04/2020 INDICATION: Renal failure. Abdominal distention. Ascites. Request for therapeutic paracentesis. EXAM: ULTRASOUND GUIDED RIGHT LOWER QUADRANT PARACENTESIS MEDICATIONS: None. COMPLICATIONS: None immediate. PROCEDURE: Informed written consent was obtained from the patient after a discussion of the risks, benefits and alternatives to treatment. A timeout was performed prior to the initiation of the procedure. Initial ultrasound scanning demonstrates a large amount of ascites within the right lower abdominal quadrant. The right lower abdomen was prepped and draped in the usual sterile fashion. 1%  lidocaine was used for local anesthesia. Following this, a 6 Fr Safe-T-Centesis catheter was introduced. An ultrasound image was saved for documentation purposes. The paracentesis was performed. The catheter was removed and a dressing was applied. The patient tolerated the procedure well without immediate post procedural complication. FINDINGS: A total of approximately 7.5 L of clear, dark yellow fluid was removed. IMPRESSION: Successful ultrasound-guided paracentesis yielding 7.5 liters of peritoneal fluid. Read by: Ascencion Dike PA-C Electronically Signed   By: Marcello Moores  Register   On: 03/03/2020 15:06   DG Chest Portable 1 View  Result Date: 03/03/2020 CLINICAL DATA:  Dyspnea EXAM: PORTABLE CHEST 1 VIEW COMPARISON:  February 02, 2019 FINDINGS: There is slight interval increase in the cardiomegaly. There is diffusely increased interstitial markings seen throughout both lungs. No acute osseous abnormality. IMPRESSION: Slight interval increase in the cardiomegaly and findings suggestive of interstitial edema. Electronically Signed   By: Prudencio Pair M.D.   On: 03/03/2020 06:31     Medications:   . sodium chloride     . amLODipine  10 mg Oral Daily  . aspirin EC  81 mg Oral Daily  . calcium acetate  1,334 mg Oral TID WC  . carvedilol  12.5 mg Oral BID  . Chlorhexidine Gluconate Cloth  6 each Topical Q0600  . clotrimazole  10 mg Oral 5 X Daily  . DULoxetine  60 mg Oral BID  . famotidine  10 mg Oral Daily  . feeding supplement (NEPRO CARB STEADY)  237 mL Oral BID BM  . heparin  5,000 Units Subcutaneous Q8H  . hydrOXYzine  25 mg Oral Daily  . linaclotide  145 mcg Oral Daily  . mometasone-formoterol  2 puff Inhalation BID  . multivitamin  1 tablet Oral Daily  . nicotine  7 mg Transdermal Daily  . pantoprazole  40 mg Oral Daily  . sodium chloride flush  3 mL Intravenous Q12H  . sucralfate  1 g Oral TID WC & HS  . tiotropium  18 mcg Inhalation Daily  . torsemide  100 mg Oral Daily  .  triamcinolone cream   Topical BID  . [START ON 03/06/2020] Vitamin D (Ergocalciferol)  50,000 Units Oral Q Sun   sodium chloride, acetaminophen, albuterol, benzonatate, docusate sodium, magic mouthwash **AND** lidocaine, lidocaine-prilocaine, ondansetron (ZOFRAN) IV, oxyCODONE, sodium chloride flush, zolpidem  Assessment/ Plan:  50 y.o. female with end-stage renal disease, COPD, hypertension   Principal Problem:   Acute on chronic diastolic CHF (congestive heart failure) (HCC) Active Problems:   Anemia in chronic kidney disease   ESRD on dialysis ([place])   Anasarca associated with disorder of kidney   Hyponatremia with excess extracellular fluid volume   Hyperkalemia   #. Hyperkalemia in setting of ESRD  Missed 3 treatments. Last treatment on Feb 23 2499 cc of fluid was removed with hemodialysis on Thursday Routine hemodialysis today.  Volume removal as tolerated  #. Anemia of CKD  Lab Results  Component Value Date   HGB 11.4 (L) 03/03/2020  Hb acceptable. Hold EPO   #. [facility]     Component Value Date/Time   PTH 73 (H) 01/03/2019 0243   PTH Comment 01/03/2019 0243   Lab Results  Component Value Date   PHOS 6.4 (H) 03/03/2020   Monitor calcium and phos level during this admission  #. HTN, LE Edema Currently on room air Volume removal with HD as tolerated Blood pressure control has improved with volume removal   LOS: 1 [place] 3/5/202111:27 [place], [place]

## 2020-03-04 NOTE — Progress Notes (Signed)
HD Tx completed, tolerated well, Fluid goal challanged at 3.0Kg and tolerated well.    03/04/20 1153  Vital Signs  Pulse Rate (!) 103  Pulse Rate Source Monitor  Resp 16  BP 140/80  BP Location Right Arm  BP Method Automatic  Patient Position (if appropriate) Lying  Oxygen Therapy  SpO2 93 %  O2 Device Room Air  During Hemodialysis Assessment  KECN 79.9 KECN  Dialysis Fluid Bolus Normal Saline  Bolus Amount (mL) 250 mL  Intra-Hemodialysis Comments Tx completed;Tolerated well

## 2020-03-04 NOTE — Progress Notes (Signed)
Initial Nutrition Assessment  DOCUMENTATION CODES:   Not applicable  INTERVENTION:   Nepro Shake po BID, each supplement provides 425 kcal and 19 grams protein  Rena-vite daily   NUTRITION DIAGNOSIS:   Increased nutrient needs related to chronic illness(ESRD on HD, COPD) as evidenced by increased estimated needs.  GOAL:   Patient will meet greater than or equal to 90% of their needs  MONITOR:   PO intake, Supplement acceptance, Labs, Weight trends, Skin, I & O's  REASON FOR ASSESSMENT:   Malnutrition Screening Tool    ASSESSMENT:   50 y.o. female with medical history significant for  HFpEF, end-stage renal disease on hemodialysis (M/W/F), ongoing tobacco abuse, COPD, hypertension, GERD, and anemia of chronic disease presented to the emergency room for evaluation of increased abdominal girth, poor oral intake and nausea.   Pt s/p paracentesis 3/4 with 7.5L output   Unable to see patient today as pt in HD at time of RD visit. RD suspects pt with poor appetite and oral intake pta r/t nausea and ascites. Per chart, pt is down 35lbs(16%) since May 2020; RD unsure how recently weight loss occurred or how much actual weight loss has occurred as pt with ascites. It appears as if pt's UBW is ~215lbs. RD will add supplements to help pt meet her estimated needs. Will obtain nutrition related history and exam at follow-up.   Medications reviewed and include: aspirin, phoslo, pepcid, heparin, rena-vite, nicotine, protonix, carafate, torsemide, vitamin D  Labs reviewed: BUN 76(H), creat 9.63(H), P 6.4(H)  Unable to complete Nutrition-Focused physical exam at this time.   Diet Order:   Diet Order            Diet renal with fluid restriction Fluid restriction: 1200 mL Fluid; Room service appropriate? Yes; Fluid consistency: Thin  Diet effective now             EDUCATION NEEDS:   Not appropriate for education at this time  Skin:  Skin Assessment: Reviewed RN Assessment(incision  neck)  Last BM:  3/4  Height:   Ht Readings from Last 1 Encounters:  03/03/20 5\' 6"  (1.676 m)    Weight:   Wt Readings from Last 1 Encounters:  03/04/20 82.3 kg    Ideal Body Weight:  59 kg  BMI:  Body mass index is 29.29 kg/m.  Estimated Nutritional Needs:   Kcal:  1900-2200kcal/day  Protein:  95-110g/day  Fluid:  UOP +1L  Koleen Distance MS, RD, LDN Contact information available in Amion

## 2020-03-04 NOTE — Progress Notes (Signed)
Post HD Tx     03/04/20 1200  Hand-Off documentation  Report given to (Full Name) Langley Gauss, RN  Report received from (Full Name)  Beatris Ship, RN   Vital Signs  Temp 98.6 F (37 C)  Temp Source Oral  Pulse Rate 100  Pulse Rate Source Monitor  Resp 15  BP (!) 142/80  BP Location Right Arm  BP Method Automatic  Patient Position (if appropriate) Lying  Oxygen Therapy  SpO2 93 %  O2 Device Room Air  Pain Assessment  Pain Scale 0-10  Pain Score 0  Dialysis Weight  Weight 81.5 kg  Type of Weight Post-Dialysis  Post-Hemodialysis Assessment  Rinseback Volume (mL) 250 mL  KECN 79.9 V  Dialyzer Clearance Clear  Duration of HD Treatment -hour(s) 3.5 hour(s)  Hemodialysis Intake (mL) 500 mL  UF Total -Machine (mL) 3500 mL  Net UF (mL) 3000 mL  Tolerated HD Treatment Yes  AVG/AVF Arterial Site Held (minutes) 10 minutes  AVG/AVF Venous Site Held (minutes) 5 minutes  Education / Care Plan  Dialysis Education Provided Yes  Documented Education in Care Plan Yes  Fistula / Graft Left Upper arm Arteriovenous fistula  Placement Date: 08/08/17   Placed prior to admission: Yes  Orientation: Left  Access Location: Upper arm  Access Type: Arteriovenous fistula  Site Condition No complications  Fistula / Graft Assessment Present;Thrill;Bruit  Status Deaccessed  Drainage Description None

## 2020-03-04 NOTE — Progress Notes (Signed)
HD Tx started w/o complication    00/17/49 0808  Vital Signs  Pulse Rate 86  Pulse Rate Source Monitor  Resp 18  BP 135/77  BP Location Right Arm  BP Method Automatic  Patient Position (if appropriate) Lying  Oxygen Therapy  SpO2 95 %  O2 Device Room Air  Pulse Oximetry Type Continuous  During Hemodialysis Assessment  Blood Flow Rate (mL/min) 400 mL/min  Arterial Pressure (mmHg) -130 mmHg  Venous Pressure (mmHg) 160 mmHg  Transmembrane Pressure (mmHg) 60 mmHg  Ultrafiltration Rate (mL/min) 1000 mL/min  Dialysate Flow Rate (mL/min) 500 ml/min  Conductivity: Machine  14  HD Safety Checks Performed Yes  Dialysis Fluid Bolus Normal Saline  Bolus Amount (mL) 250 mL  Intra-Hemodialysis Comments Tx initiated  Fistula / Graft Left Upper arm Arteriovenous fistula  Placement Date: 08/08/17   Placed prior to admission: Yes  Orientation: Left  Access Location: Upper arm  Access Type: Arteriovenous fistula  Site Condition No complications  Fistula / Graft Assessment Present;Thrill;Bruit  Status Accessed  Needle Size 15g  Drainage Description None

## 2020-03-04 NOTE — Discharge Summary (Signed)
Physician Discharge Summary  Janet Olson SHF:[id number] DOB: 1970/10/20 DOA: 03/03/2020  PCP: Remi Haggard, FNP  Admit date: 03/03/2020 Discharge date: 03/04/2020  Admitted From: Home Disposition: Home  Recommendations for Outpatient Follow-up:  1. Follow up with scheduled hemodialysis on Mondays, Wednesdays and Fridays. 2. Follow-up with PCP and cardiology as scheduled.  Home Health: None Equipment/Devices: None  Discharge Condition: Fair CODE STATUS: Full code Diet recommendation: Heart Healthy /renal    Discharge Diagnoses:  Principal Problem:   Acute on chronic diastolic CHF (congestive heart failure) (HCC)  Active Problems:   Anemia in chronic kidney disease   ESRD on dialysis ([place])   Anasarca associated with disorder of kidney   Hyponatremia with excess extracellular fluid volume   Hyperkalemia Abdominal ascites  Brief narrative/HPI 50 year old female with history of chronic diastolic CHF, ESRD on hemodialysis M, W, F, COPD with ongoing tobacco use, hypertension, GERD and anemia of chronic disease presented with increasing abdominal distention,poor p.o. intake and nausea with increasing shortness of breath.  She was hospitalized 1 month back with ascites requiring paracentesis with removal of 2 L fluid.  Patient reports that she has missed several recent dialysis (3 in total) as she was feeling sick. In the ED vitals were stable except for elevated blood pressure.  Chest x-ray with acute interstitial edema. Nephrology consulted and patient placed on observation for urgent hemodialysis and further management.  Hospital course   Principal problem Acute on chronic diastolic CHF ([place]) Acute pulmonary edema Secondary to volume overload after missing several recent dialysis sessions. Successfully dialyzed on 3/4 and respiratory symptoms improved.  Patient stable on room air.  Instructed on adhering to her dialysis session.  Resume home dose  diuretic.  Active problems Anasarca with abdominal ascites Suspected cardiorenal etiology.  Underwent paracentesis with 7.5 L clear ascites fluid removed.  Symptoms markedly improved with paracentesis and hemodialysis. Follow-up with PCP and renal as outpatient.  End-stage renal disease on hemodialysis Was hypervolemic and hyperkalemic on presentation.  Resolved with hemodialysis on 3/4 and 3/5.  Will return to her scheduled hemodialysis early next week.  Anemia of end-stage renal disease Stable H&H  GERD Continue PPI  COPD with ongoing tobacco use Continue home inhaler.  Counseled strongly on smoking cessation  Essential hypertension Stable.  Continue home meds   Procedure: Large-volume paracentesis, hemodialysis Consult: Nephrology, IR  Family communication: None at bedside Disposition: Patient stable to be discharged home. Discharge Instructions  Discharge Instructions    AMB referral to CHF clinic   Complete by: As directed    AMB referral to pulmonary rehabilitation   Complete by: As directed    Please select a program: Respiratory Care Services   Respiratory Care Services Diagnosis: Heart Failure   After initial evaluation and assessments completed: Virtual Based Care may be provided alone or in conjunction with Pulmonary Rehab/Respiratory Care services based on patient barriers.: Yes     Allergies as of 03/04/2020   No Known Allergies     Medication List    STOP taking these medications   clotrimazole 10 MG troche Commonly known as: MYCELEX   ibuprofen 600 MG tablet Commonly known as: ADVIL     TAKE these medications   albuterol (2.5 MG/3ML) 0.083% nebulizer solution Commonly known as: PROVENTIL Inhale into the lungs every 6 (six) hours as needed.   albuterol 108 (90 Base) MCG/ACT inhaler Commonly known as: VENTOLIN HFA Inhale into the lungs.   albuterol 108 (90 Base) MCG/ACT inhaler Commonly known as: VENTOLIN  HFA Inhale 2 puffs into the lungs  every 6 (six) hours as needed for wheezing.   amLODipine 10 MG tablet Commonly known as: NORVASC Take 1 tablet (10 mg total) by mouth daily.   benzonatate 100 MG capsule Commonly known as: TESSALON Take 100 mg by mouth 2 (two) times daily as needed.   calcium acetate 667 MG capsule Commonly known as: PHOSLO Take 2 capsules (1,334 mg total) by mouth 3 (three) times daily with meals.   carvedilol 12.5 MG tablet Commonly known as: COREG Take 1 tablet (12.5 mg total) by mouth 2 (two) times daily.   chlorpheniramine-HYDROcodone 10-8 MG/5ML Suer Commonly known as: TUSSIONEX Take by mouth as needed.   docusate sodium 100 MG capsule Commonly known as: COLACE Take by mouth daily as needed.   Dulera 200-5 MCG/ACT Aero Generic drug: mometasone-formoterol Inhale 2 puffs into the lungs 2 (two) times daily.   DULoxetine 60 MG capsule Commonly known as: CYMBALTA Take 60 mg by mouth 2 (two) times daily.   famotidine 20 MG tablet Commonly known as: PEPCID Take 20 mg by mouth 2 (two) times daily.   feeding supplement (NEPRO CARB STEADY) Liqd Take 237 mLs by mouth 2 (two) times daily between meals.   hydrOXYzine 25 MG tablet Commonly known as: ATARAX/VISTARIL Take 25 mg by mouth daily.   lidocaine-prilocaine cream Commonly known as: EMLA Apply 1 application topically daily as needed [facility] access for dialysis).   LINZESS PO Take by mouth daily.   magic mouthwash w/lidocaine Soln Take 5 mLs by mouth 4 (four) times daily as needed for mouth pain.   Melatonin 10 MG Caps Take by mouth at bedtime as needed.   metoprolol tartrate 50 MG tablet Commonly known as: LOPRESSOR Take 50 mg by mouth 2 (two) times daily.   multivitamin Tabs tablet Take 1 tablet by mouth daily.   nicotine 7 mg/24hr patch Commonly known as: NICODERM CQ - dosed in mg/24 hr Place onto the skin. Uses occasionally   oxyCODONE 5 MG immediate release tablet Commonly known as: Oxy IR/ROXICODONE Take by  mouth as needed.   pantoprazole 40 MG tablet Commonly known as: PROTONIX Take 1 tablet (40 mg total) by mouth daily.   ramelteon 8 MG tablet Commonly known as: ROZEREM Take by mouth at bedtime.   sucralfate 1 g tablet Commonly known as: CARAFATE Take by mouth as needed.   tiotropium 18 MCG inhalation capsule Commonly known as: Vian into inhaler and inhale.   torsemide 100 MG tablet Commonly known as: DEMADEX Take 1 tablet (100 mg total) by mouth daily.   triamcinolone cream 0.5 % Commonly known as: KENALOG Apply topically as directed.   Vitamin D (Ergocalciferol) 1.25 MG (50000 UNIT) Caps capsule Commonly known as: DRISDOL Take 50,000 Units by mouth every Sunday.   zolpidem 5 MG tablet Commonly known as: AMBIEN Take by mouth at bedtime as needed.      Follow-up Information    Remi Haggard, FNP. Schedule an appointment as soon as possible for a visit in 1 week(s).   Specialty: Family Medicine Contact information: [place] [address] [phone]        Minna Merritts, MD .   Specialty: Cardiology Contact information: [address]  [place] [address] [phone]        scheduled hemodialysis every mondays,wednesdays and fridays Follow up.          No Known Allergies      Procedures/Studies: US Paracentesis  Result Date: 03/04/2020 INDICATION: Renal failure. Abdominal distention. Ascites. Request for therapeutic paracentesis. EXAM: ULTRASOUND GUIDED RIGHT LOWER QUADRANT PARACENTESIS MEDICATIONS: None. COMPLICATIONS: None immediate. PROCEDURE: Informed written consent was obtained from the patient after a discussion of the risks, benefits and alternatives to treatment. A timeout was performed prior to the initiation of the procedure. Initial ultrasound scanning demonstrates a large amount of ascites within the right lower abdominal quadrant. The right lower abdomen was prepped and draped in the  usual sterile fashion. 1% lidocaine was used for local anesthesia. Following this, a 6 Fr Safe-T-Centesis catheter was introduced. An ultrasound image was saved for documentation purposes. The paracentesis was performed. The catheter was removed and a dressing was applied. The patient tolerated the procedure well without immediate post procedural complication. FINDINGS: A total of approximately 7.5 L of clear, dark yellow fluid was removed. IMPRESSION: Successful ultrasound-guided paracentesis yielding 7.5 liters of peritoneal fluid. Read by: Ascencion Dike PA-C Electronically Signed   By: Marcello Moores  Register   On: 03/03/2020 15:06   DG Chest Portable 1 View  Result Date: 03/03/2020 CLINICAL DATA:  Dyspnea EXAM: PORTABLE CHEST 1 VIEW COMPARISON:  February 02, 2019 FINDINGS: There is slight interval increase in the cardiomegaly. There is diffusely increased interstitial markings seen throughout both lungs. No acute osseous abnormality. IMPRESSION: Slight interval increase in the cardiomegaly and findings suggestive of interstitial edema. Electronically Signed   By: Prudencio Pair M.D.   On: 03/03/2020 06:31   ECHOCARDIOGRAM COMPLETE  Result Date: 02/23/2020    ECHOCARDIOGRAM REPORT   Patient Name:   Janet Olson Date of Exam: 02/23/2020 Medical Rec #:  [id number]              Height:       66.0 in Accession #:    [id number]             Weight:       182.0 lb Date of Birth:  [birth year]              BSA:          1.921 m Patient Age:    36 years               BP:           140/80 mmHg Patient Gender: F                      HR:           86 bpm. Exam Location:  [facility] Procedure: 2D Echo, Cardiac Doppler and Color Doppler Indications:    I50.20* Unspecified systolic (congestive) heart failure  History:        Patient has prior history of Echocardiogram examinations, most                 recent 05/18/2017. CHF, Pulmonary HTN and COPD; Risk                 Factors:Current Smoker.  Sonographer:    Pilar Jarvis  RDMS, RVT, RDCS Referring Phys: [place]  1. Left ventricular ejection fraction, by estimation, is 50 to 55%. The left ventricle has low normal function. The left ventricle has no regional wall motion abnormalities. There is moderate left ventricular hypertrophy. Left ventricular diastolic parameters are indeterminate. There is the interventricular septum is flattened in diastole ('D' shaped left ventricle), consistent with right ventricular volume overload.  2. Right ventricular systolic function is moderately reduced. The right  ventricular size is mildly enlarged. There is moderately elevated pulmonary artery systolic pressure.  3. Left atrial size was mildly dilated.  4. Right atrial size was moderately dilated.  5. Acites is present.. The pericardial effusion is posterior to the left ventricle.  6. The mitral valve is myxomatous. Trivial mitral valve regurgitation.  7. The aortic valve is tricuspid. Aortic valve regurgitation is not visualized. No aortic stenosis is present.  8. Moderately dilated pulmonary artery.  9. The inferior vena cava is dilated in size with <50% respiratory variability, suggesting right atrial pressure of 15 mmHg. FINDINGS  Left Ventricle: Left ventricular ejection fraction, by estimation, is 50 to 55%. The left ventricle has low normal function. The left ventricle has no regional wall motion abnormalities. The left ventricular internal cavity size was normal in size. There is moderate left ventricular hypertrophy. The interventricular septum is flattened in diastole ('D' shaped left ventricle), consistent with right ventricular volume overload. Left ventricular diastolic parameters are indeterminate. Right Ventricle: The right ventricular size is mildly enlarged. No increase in right ventricular wall thickness. Right ventricular systolic function is moderately reduced. There is moderately elevated pulmonary artery systolic pressure. The tricuspid  regurgitant velocity is 3.01 m/s, and with an assumed right atrial pressure of 15 mmHg, the estimated right ventricular systolic pressure is 33.2 mmHg. Left Atrium: Left atrial size was mildly dilated. Right Atrium: Right atrial size was moderately dilated. Pericardium: Acites is present. Trivial pericardial effusion is present. The pericardial effusion is posterior to the left ventricle. Mitral Valve: The mitral valve is myxomatous. There is mild thickening of the mitral valve leaflet(s). Trivial mitral valve regurgitation. Tricuspid Valve: The tricuspid valve is normal in structure. Tricuspid valve regurgitation is mild. Aortic Valve: The aortic valve is tricuspid. Aortic valve regurgitation is not visualized. No aortic stenosis is present. Aortic valve mean gradient measures 3.0 mmHg. Aortic valve peak gradient measures 6.7 mmHg. Aortic valve area, by VTI measures 3.69 cm. Pulmonic Valve: The pulmonic valve was normal in structure. Pulmonic valve regurgitation is mild. No evidence of pulmonic stenosis. Aorta: The aortic root and ascending aorta are structurally normal, with no evidence of dilitation. Pulmonary Artery: The pulmonary artery is moderately dilated. Venous: The inferior vena cava is dilated in size with less than 50% respiratory variability, suggesting right atrial pressure of 15 mmHg. IAS/Shunts: No atrial level shunt detected by color flow Doppler.  LEFT VENTRICLE PLAX 2D LVIDd:         4.80 cm LVIDs:         3.70 cm LV PW:         1.40 cm LV IVS:        1.30 cm LVOT diam:     2.30 cm LV SV:         89 LV SV Index:   46 LVOT Area:     4.15 cm  LV Volumes (MOD) LV vol d, MOD A4C: 114.5 ml LV vol s, MOD A4C: 55.3 ml LV SV MOD A4C:     114.5 ml RIGHT VENTRICLE             IVC RV Basal diam:  4.65 cm     IVC diam: 2.30 cm RV S prime:     10.80 cm/s TAPSE (M-mode): 1.2 cm LEFT ATRIUM             Index       RIGHT ATRIUM           Index LA diam:  5.40 cm 2.81 cm/m  RA Area:     30.40 cm LA Vol  (A2C):   78.9 ml 41.07 ml/m RA Volume:   111.00 ml 57.78 ml/m LA Vol (A4C):   57.3 ml 29.82 ml/m LA Biplane Vol: 67.7 ml 35.24 ml/m  AORTIC VALVE                   PULMONIC VALVE AV Area (Vmax):    3.86 cm    PV Vmax:       0.61 m/s AV Area (Vmean):   3.78 cm    PV Peak grad:  1.5 mmHg AV Area (VTI):     3.69 cm AV Vmax:           129.00 cm/s AV Vmean:          84.500 cm/s AV VTI:            0.242 m AV Peak Grad:      6.7 mmHg AV Mean Grad:      3.0 mmHg LVOT Vmax:         120.00 cm/s LVOT Vmean:        76.900 cm/s LVOT VTI:          0.215 m LVOT/AV VTI ratio: 0.89  AORTA Ao Root diam: 3.50 cm Ao Asc diam:  3.30 cm Ao Arch diam: 2.7 cm TRICUSPID VALVE TR Peak grad:   36.2 mmHg TR Vmax:        301.00 cm/s  SHUNTS Systemic VTI:  0.22 m Systemic Diam: 2.30 cm Nelva Bush MD Electronically signed by Nelva Bush MD Signature Date/Time: 02/23/2020/5:47:09 PM    Final      Subjective: Feels much better after hemodialysis yesterday and today.  Denies any chest pain or shortness of breath.  Abdominal distention much improved.  Discharge Exam: Vitals:   03/04/20 1200 03/04/20 1221  BP: (!) 142/80 (!) 149/73  Pulse: 100 (!) 102  Resp: 15 18  Temp: 98.6 F (37 C) 98.8 F (37.1 C)  SpO2: 93% 93%   Vitals:   03/04/20 1145 03/04/20 1153 03/04/20 1200 03/04/20 1221  BP: (!) 143/94 140/80 (!) 142/80 (!) 149/73  Pulse: (!) 102 (!) 103 100 (!) 102  Resp: 18 16 15 18   Temp:   98.6 F (37 C) 98.8 F (37.1 C)  TempSrc:   Oral Oral  SpO2: 94% 93% 93% 93%  Weight:   81.5 kg   Height:        General: Middle-aged female not in distress HEENT: Moist mucosa, supple neck Chest: Clear to auscultation bilaterally CVs: Normal S1-S2, no murmurs rub or gallop GI: Soft, minimal abdominal distention with ascites, nontender Musculoskeletal: Warm, no edema   The results of significant diagnostics from this hospitalization (including imaging, microbiology, ancillary and laboratory) are listed below  for reference.     Microbiology: Recent Results (from the past 240 hour(s))  Respiratory Panel by RT PCR (Flu A&B, Covid) - Nasopharyngeal Swab     Status: None   Collection Time: 03/03/20  8:53 AM   Specimen: Nasopharyngeal Swab  Result Value Ref Range Status   SARS Coronavirus 2 by RT PCR NEGATIVE NEGATIVE Final    Comment: (NOTE) SARS-CoV-2 target nucleic acids are NOT DETECTED. The SARS-CoV-2 RNA is generally detectable in upper respiratoy specimens during the acute phase of infection. The lowest concentration of SARS-CoV-2 viral copies this assay can detect is 131 copies/mL. A negative result does not preclude SARS-Cov-2 infection and should not be  used as the sole basis for treatment or other patient management decisions. A negative result may occur with  improper specimen collection/handling, submission of specimen other than nasopharyngeal swab, presence of viral mutation(s) within the areas targeted by this assay, and inadequate number of viral copies (<131 copies/mL). A negative result must be combined with clinical observations, patient history, and epidemiological information. The expected result is Negative. Fact Sheet for Patients:  PinkCheek.be Fact Sheet for Healthcare Providers:  GravelBags.it This test is not yet ap proved or cleared by the [place] FDA and  has been authorized for detection and/or diagnosis of SARS-CoV-2 by FDA under an Emergency Use Authorization (EUA). This EUA will remain  in effect (meaning this test can be used) for the duration of the COVID-19 declaration under Section 564(b)(1) of the Act, 21 U.S.C. section 360bbb-3(b)(1), unless the authorization is terminated or revoked sooner.    Influenza A by PCR NEGATIVE NEGATIVE Final   Influenza B by PCR NEGATIVE NEGATIVE Final    Comment: (NOTE) The Xpert Xpress SARS-CoV-2/FLU/RSV assay is intended as an aid in  the diagnosis of  influenza from Nasopharyngeal swab specimens and  should not be used as a sole basis for treatment. Nasal washings and  aspirates are unacceptable for Xpert Xpress SARS-CoV-2/FLU/RSV  testing. Fact Sheet for Patients: PinkCheek.be Fact Sheet for Healthcare Providers: GravelBags.it This test is not yet approved or cleared by the [place] FDA and  has been authorized for detection and/or diagnosis of SARS-CoV-2 by  FDA under an Emergency Use Authorization (EUA). This EUA will remain  in effect (meaning this test can be used) for the duration of the  Covid-19 declaration under Section 564(b)(1) of the Act, 21  U.S.C. section 360bbb-3(b)(1), unless the authorization is  terminated or revoked. Performed at [facility], [address] [place], [place] [address]      Labs: BNP (last 3 results) No results for input(s): BNP in the last 8760 hours. Basic Metabolic Panel: Recent Labs  Lab 03/03/20 0700 03/03/20 1041 03/04/20 0713  NA 126*  --  135  K 6.1*  --  4.4  CL 89*  --  95*  CO2 14*  --  24  GLUCOSE 53*  --  91  BUN 124*  --  76*  CREATININE 12.55*  --  9.63*  CALCIUM 8.5*  --  8.0*  PHOS  --  6.4*  --    Liver Function Tests: Recent Labs  Lab 03/03/20 0700  AST 21  ALT 21  ALKPHOS 191*  BILITOT 1.1  PROT 8.1  ALBUMIN 3.6   Recent Labs  Lab 03/03/20 0700  LIPASE 31   No results for input(s): AMMONIA in the last 168 hours. CBC: Recent Labs  Lab 03/03/20 0616 03/03/20 1041  WBC 5.7 4.1  HGB 12.3 11.4*  HCT 38.0 33.9*  MCV 83.2 81.7  PLT 255 223   Cardiac Enzymes: No results for input(s): CKTOTAL, CKMB, CKMBINDEX, TROPONINI in the last 168 hours. BNP: Invalid input(s): POCBNP CBG: No results for input(s): GLUCAP in the last 168 hours. D-Dimer No results for input(s): DDIMER in the last 72 hours. Hgb A1c No results for input(s): HGBA1C in the last 72 hours. Lipid Profile No  results for input(s): CHOL, HDL, LDLCALC, TRIG, CHOLHDL, LDLDIRECT in the last 72 hours. Thyroid function studies No results for input(s): TSH, T4TOTAL, T3FREE, THYROIDAB in the last 72 hours.  Invalid input(s): FREET3 Anemia work up No results for input(s): VITAMINB12, FOLATE, FERRITIN, TIBC,  IRON, RETICCTPCT in the last 72 hours. Urinalysis    Component Value Date/Time   COLORURINE AMBER (A) 02/21/2018 2232   APPEARANCEUR CLOUDY (A) 02/21/2018 2232   LABSPEC 1.029 02/21/2018 2232   PHURINE 6.0 02/21/2018 2232   GLUCOSEU 150 (A) 02/21/2018 2232   HGBUR NEGATIVE 02/21/2018 2232   BILIRUBINUR NEGATIVE 02/21/2018 2232   KETONESUR NEGATIVE 02/21/2018 2232   PROTEINUR 100 (A) 02/21/2018 2232   NITRITE NEGATIVE 02/21/2018 2232   LEUKOCYTESUR NEGATIVE 02/21/2018 2232   Sepsis Labs Invalid input(s): PROCALCITONIN,  WBC,  LACTICIDVEN Microbiology Recent Results (from the past 240 hour(s))  Respiratory Panel by RT PCR (Flu A&B, Covid) - Nasopharyngeal Swab     Status: None   Collection Time: 03/03/20  8:53 AM   Specimen: Nasopharyngeal Swab  Result Value Ref Range Status   SARS Coronavirus 2 by RT PCR NEGATIVE NEGATIVE Final    Comment: (NOTE) SARS-CoV-2 target nucleic acids are NOT DETECTED. The SARS-CoV-2 RNA is generally detectable in upper respiratoy specimens during the acute phase of infection. The lowest concentration of SARS-CoV-2 viral copies this assay can detect is 131 copies/mL. A negative result does not preclude SARS-Cov-2 infection and should not be used as the sole basis for treatment or other patient management decisions. A negative result may occur with  improper specimen collection/handling, submission of specimen other than nasopharyngeal swab, presence of viral mutation(s) within the areas targeted by this assay, and inadequate number of viral copies (<131 copies/mL). A negative result must be combined with clinical observations, patient history, and  epidemiological information. The expected result is Negative. Fact Sheet for Patients:  PinkCheek.be Fact Sheet for Healthcare Providers:  GravelBags.it This test is not yet ap proved or cleared by the [place] FDA and  has been authorized for detection and/or diagnosis of SARS-CoV-2 by FDA under an Emergency Use Authorization (EUA). This EUA will remain  in effect (meaning this test can be used) for the duration of the COVID-19 declaration under Section 564(b)(1) of the Act, 21 U.S.C. section 360bbb-3(b)(1), unless the authorization is terminated or revoked sooner.    Influenza A by PCR NEGATIVE NEGATIVE Final   Influenza B by PCR NEGATIVE NEGATIVE Final    Comment: (NOTE) The Xpert Xpress SARS-CoV-2/FLU/RSV assay is intended as an aid in  the diagnosis of influenza from Nasopharyngeal swab specimens and  should not be used as a sole basis for treatment. Nasal washings and  aspirates are unacceptable for Xpert Xpress SARS-CoV-2/FLU/RSV  testing. Fact Sheet for Patients: PinkCheek.be Fact Sheet for Healthcare Providers: GravelBags.it This test is not yet approved or cleared by the [place] FDA and  has been authorized for detection and/or diagnosis of SARS-CoV-2 by  FDA under an Emergency Use Authorization (EUA). This EUA will remain  in effect (meaning this test can be used) for the duration of the  Covid-19 declaration under Section 564(b)(1) of the Act, 21  U.S.C. section 360bbb-3(b)(1), unless the authorization is  terminated or revoked. Performed at [facility], [address] [place], [place] [address]      Time coordinating discharge: 25 minutes  SIGNED:   Louellen Molder, MD  [facility] 03/04/2020, 3:30 PM Pager   If 7PM-7AM, please contact night-coverage [email or web address] Password [email or web address]

## 2020-03-04 NOTE — Discharge Instructions (Signed)
Ascites  Ascites is a collection of too much fluid in the abdomen. Ascites can range from mild to severe. If ascites is not treated, it can get worse. What are the causes? This condition may be caused by:  A liver condition called cirrhosis. This is the most common cause of ascites.  Long-term (chronic) or alcoholic hepatitis.  Infection or inflammation in the abdomen.  Cancer in the abdomen.  Heart failure.  Kidney disease.  Inflammation of the pancreas.  Clots in the veins of the liver. What are the signs or symptoms? Symptoms of this condition include:  A feeling of fullness in the abdomen. This is common.  An increase in the size of the abdomen or waist.  Swelling in the legs.  Swelling of the scrotum (in men).  Difficulty breathing.  Pain in the abdomen.  Sudden weight gain. If the condition is mild, you may not have symptoms. How is this diagnosed? This condition is diagnosed based on your medical history and a physical exam. Your health care provider may order imaging tests, such as an ultrasound or CT scan of your abdomen. How is this treated? Treatment for this condition depends on the cause of the ascites. It may include:  Taking a pill to make you urinate. This is called a water pill (diuretic pill).  Strictly reducing your salt (sodium) intake. Salt can cause extra fluid to be kept (retained) in the body, and this makes ascites worse.  Having a procedure to remove fluid from your abdomen (paracentesis).  Having a procedure that connects two of the major veins within your liver and relieves pressure on your liver. This is called a TIPS procedure (transjugular intrahepatic portosystemic shunt procedure).  Placement of a drainage catheter (peritoneovenous shunt) to manage the extra fluid in the abdomen. Ascites may go away or improve when the condition that caused it is treated. Follow these instructions at home:  Keep track of your weight. To do this,  weigh yourself at the same time every day and write down your weight.  Keep track of how much you drink and any changes in how much or how often you urinate.  Follow any instructions that your health care provider gives you about how much to drink.  Try not to eat salty (high-sodium) foods.  Take over-the-counter and prescription medicines only as told by your health care provider.  Keep all follow-up visits as told by your health care provider. This is important.  Report any changes in your health to your health care provider, especially if you develop new symptoms or your symptoms get worse. Contact a health care provider if:  You gain more than 3 lb (1.36 kg) in 3 days.  Your waist size increases.  You have new swelling in your legs.  The swelling in your legs gets worse. Get help right away if:  You have a fever.  You are confused.  You have new or worsening breathing trouble.  You have new or worsening pain in your abdomen.  You have new or worsening swelling in the scrotum (in men). Summary  Ascites is a collection of too much fluid in the abdomen.  Ascites may be caused by various conditions, such as cirrhosis, hepatitis, cancer, or congestive heart failure.  Symptoms may include swelling of the abdomen and other areas due to extra fluid in the body.  Treatments may involve dietary changes, medicines, or procedures. This information is not intended to replace advice given to you by your health care   provider. Make sure you discuss any questions you have with your health care provider. Document Revised: 11/18/2018 Document Reviewed: 08/29/2017 Elsevier Patient Education  2020 [employer]  

## 2020-03-04 NOTE — Progress Notes (Signed)
D/C home with daughter

## 2020-03-04 NOTE — Plan of Care (Signed)
VSS. NSR on tele. Chronic back pain managed w/ prn pain medication. Post HD today. Up ad lib, independent w/ needs in room. POC reviewed. Questions answered.  Problem: Education: Goal: Knowledge of General Education information will improve Description: Including pain rating scale, medication(s)/side effects and non-pharmacologic comfort measures Outcome: Progressing   Problem: Health Behavior/Discharge Planning: Goal: Ability to manage health-related needs will improve Outcome: Progressing   Problem: Clinical Measurements: Goal: Ability to maintain clinical measurements within normal limits will improve Outcome: Progressing Goal: Will remain free from infection Outcome: Progressing Goal: Diagnostic test results will improve Outcome: Progressing Goal: Respiratory complications will improve Outcome: Progressing Goal: Cardiovascular complication will be avoided Outcome: Progressing   Problem: Activity: Goal: Risk for activity intolerance will decrease Outcome: Progressing   Problem: Nutrition: Goal: Adequate nutrition will be maintained Outcome: Progressing   Problem: Coping: Goal: Level of anxiety will decrease Outcome: Progressing   Problem: Elimination: Goal: Will not experience complications related to bowel motility Outcome: Progressing Goal: Will not experience complications related to urinary retention Outcome: Progressing   Problem: Pain Managment: Goal: General experience of comfort will improve Outcome: Progressing   Problem: Safety: Goal: Ability to remain free from injury will improve Outcome: Progressing   Problem: Skin Integrity: Goal: Risk for impaired skin integrity will decrease Outcome: Progressing   Problem: Education: Goal: Ability to demonstrate management of disease process will improve Outcome: Progressing Goal: Ability to verbalize understanding of medication therapies will improve Outcome: Progressing Goal: Individualized Educational  Video(s) Outcome: Progressing   Problem: Activity: Goal: Capacity to carry out activities will improve Outcome: Progressing   Problem: Cardiac: Goal: Ability to achieve and maintain adequate cardiopulmonary perfusion will improve Outcome: Progressing

## 2020-03-07 ENCOUNTER — Other Ambulatory Visit: Payer: Self-pay | Admitting: *Deleted

## 2020-03-07 ENCOUNTER — Telehealth: Payer: Self-pay | Admitting: Family

## 2020-03-07 DIAGNOSIS — I5032 Chronic diastolic (congestive) heart failure: Secondary | ICD-10-CM

## 2020-03-07 NOTE — Progress Notes (Signed)
Cardiology Office Note  Date:  03/08/2020   ID:  Janet Olson, Janet Olson Jun 23, 1970, MRN [id number]  PCP:  Remi Haggard, FNP   Chief Complaint  Patient presents with  . OTHER    1 month f/u c/o carvedilol causing upset stomach d/c c/o irregular heart beat and sob/edema abd admitted to ED 3/4. Meds reviewed verbally with pt.    HPI:  Janet Olson is a 50 year old woman with past medical history of ESRD On HD COPD HTN/renal dz Anemia of chronic dz Asthma Smoker Diabetes  mon/wed/Fri Who presents for f/u of her abnormal echocardiogram, pulmopnary edema  Could not tolerate coreg, upset stomach Reports having episodes of constipation Tried linzess, works well  She is still bothered by ascites High fluid intake, takes torsemide daily  hospitalized 1/21, records reviewed ascites requiring paracentesis with removal of 2 L fluid.  Patient reports that she has missed several recent dialysis (3 in total) as she was feeling sick.  Hospital 02/2020, records reviewed Secondary to volume overload after missing several recent dialysis sessions. paracentesis with 7.5 L clear ascites fluid removed  Echo 02/2020 results reviewed with her 1. Left ventricular ejection fraction, by estimation, is 50 to 55%. The  left ventricle has low normal function. The left ventricle has no regional  wall motion abnormalities. There is moderate left ventricular hypertrophy.  -- interventricular septum is  flattened in diastole ('D' shaped left ventricle), consistent with right  ventricular volume overload.  --Right ventricular systolic function is moderately reduced. The right  ventricular size is mildly enlarged. There is moderately elevated  pulmonary artery systolic pressure.  --Acites is present.. The pericardial effusion is posterior to the left  ventricle.   Moderately dilated pulmonary artery.   The inferior vena cava is dilated  right atrial pressure of 15 mmHg.   Lab work reviewed  hematocrit 33.9  EKG personally reviewed by myself on todays visit Shows normal sinus rhythm rate 87 bpm no significant ST or T wave changes    PMH:   has a past medical history of (HFpEF) heart failure with preserved ejection fraction ([place]), Acute hypoxemic respiratory failure ([place]) (01/03/2019), Acute renal failure (ARF) ([place]) (05/17/2017), Acute respiratory failure ([place]) (09/25/2018), Anemia, Asthma, Cardiomegaly, COPD (chronic obstructive pulmonary disease) ([place]), Dialysis patient ([place]), Dyspnea, ESRD (end stage renal disease) ([place]), GERD (gastroesophageal reflux disease), Headache, Hypertension, Nonrheumatic tricuspid (valve) insufficiency, Respiratory failure with hypoxia ([place]) (04/21/2018), Ulcer, and Vitamin D deficiency.  PSH:    Past Surgical History:  Procedure Laterality Date  . A/V FISTULAGRAM Left 04/17/2018   Procedure: A/V FISTULAGRAM;  Surgeon: Algernon Huxley, MD;  Location: [place] [facility];  Service: Cardiovascular;  Laterality: Left;  . A/V FISTULAGRAM Left 05/27/2018   Procedure: A/V FISTULAGRAM;  Surgeon: Katha Cabal, MD;  Location: [place] [facility];  Service: Cardiovascular;  Laterality: Left;  . AV FISTULA PLACEMENT Left 08/08/2017   Procedure: ARTERIOVENOUS (AV) FISTULA CREATION ( BRACHIOCEPHALIC );  Surgeon: Algernon Huxley, MD;  Location: [facility];  Service: Vascular;  Laterality: Left;  . CESAREAN SECTION     x 4 (1988, 1991, 1997, 2000 )  . COLONOSCOPY WITH PROPOFOL N/A 04/24/2018   Procedure: COLONOSCOPY WITH PROPOFOL;  Surgeon: Lin Landsman, MD;  Location: [facility];  Service: Gastroenterology;  Laterality: N/A;  . DIALYSIS/PERMA CATHETER INSERTION N/A 07/16/2017   Procedure: Dialysis/Perma Catheter Insertion;  Surgeon: Katha Cabal, MD;  Location: [place];  Service: Cardiovascular;  Laterality: N/A;  . DIALYSIS/PERMA  CATHETER REMOVAL N/A 10/29/2017   Procedure: DIALYSIS/PERMA CATHETER REMOVAL;  Surgeon: Katha Cabal, MD;   Location: [place] [facility];  Service: Cardiovascular;  Laterality: N/A;  . excision of lymph nodes Bilateral 2014   bilateral under arms.  Marland Kitchen PARATHYROID IMPLANT REMOVAL      Current Outpatient Medications  Medication Sig Dispense Refill  . albuterol (PROVENTIL) (2.5 MG/3ML) 0.083% nebulizer solution Inhale into the lungs every 6 (six) hours as needed.     Marland Kitchen albuterol (VENTOLIN HFA) 108 (90 Base) MCG/ACT inhaler Inhale 2 puffs into the lungs every 6 (six) hours as needed for wheezing.     Marland Kitchen amLODipine (NORVASC) 10 MG tablet Take 1 tablet (10 mg total) by mouth daily. 30 tablet 3  . benzonatate (TESSALON) 100 MG capsule Take 100 mg by mouth 2 (two) times daily as needed.    . calcium acetate (PHOSLO) 667 MG capsule Take 2 capsules (1,334 mg total) by mouth 3 (three) times daily with meals. 180 capsule 0  . chlorpheniramine-HYDROcodone (TUSSIONEX) 10-8 MG/5ML SUER Take by mouth as needed.     . docusate sodium (COLACE) 100 MG capsule Take by mouth daily as needed.     . DULERA 200-5 MCG/ACT AERO Inhale 2 puffs into the lungs 2 (two) times daily.  0  . DULoxetine (CYMBALTA) 60 MG capsule Take 60 mg by mouth 2 (two) times daily.   0  . famotidine (PEPCID) 20 MG tablet Take 20 mg by mouth 2 (two) times daily.    . hydrOXYzine (ATARAX/VISTARIL) 25 MG tablet Take 25 mg by mouth daily.    Marland Kitchen lidocaine-prilocaine (EMLA) cream Apply 1 application topically daily as needed [facility] access for dialysis).    . magic mouthwash w/lidocaine SOLN Take 5 mLs by mouth 4 (four) times daily as needed for mouth pain. 150 mL 0  . Melatonin 10 MG CAPS Take by mouth at bedtime as needed.     . metoprolol tartrate (LOPRESSOR) 50 MG tablet Take 50 mg by mouth 2 (two) times daily.    . multivitamin (RENA-VIT) TABS tablet Take 1 tablet by mouth daily.  1  . Nutritional Supplements (FEEDING SUPPLEMENT, NEPRO CARB STEADY,) LIQD Take 237 mLs by mouth 2 (two) times daily between meals. 237 mL 12  . oxyCODONE (OXY  IR/ROXICODONE) 5 MG immediate release tablet Take by mouth as needed.     . pantoprazole (PROTONIX) 40 MG tablet Take 1 tablet (40 mg total) by mouth daily. 30 tablet 0  . ramelteon (ROZEREM) 8 MG tablet Take by mouth at bedtime.     . torsemide (DEMADEX) 100 MG tablet Take 1 tablet (100 mg total) by mouth daily. 30 tablet 0  . triamcinolone cream (KENALOG) 0.5 % Apply topically as directed.     . Vitamin D, Ergocalciferol, (DRISDOL) 50000 units CAPS capsule Take 50,000 Units by mouth every Sunday.   0  . zolpidem (AMBIEN) 5 MG tablet Take by mouth at bedtime as needed.      No current facility-administered medications for this visit.     Allergies:   Patient has no known allergies.   Social History:  The patient  reports that she has been smoking cigarettes. She has a 15.00 pack-year smoking history. She has never used smokeless tobacco. She reports that she does not drink alcohol or use drugs.   Family History:   family history includes Diabetes in her mother.    Review of Systems: Review of Systems  Constitutional: Positive for malaise/fatigue.  Respiratory: Negative.   Cardiovascular: Negative.   Gastrointestinal: Negative.   Musculoskeletal: Negative.   Neurological: Negative.   Psychiatric/Behavioral: Negative.   All other systems reviewed and are negative.    PHYSICAL EXAM: VS:  BP 140/80 (BP Location: Right Arm, Patient Position: Sitting, Cuff Size: Normal)   Pulse 82   Ht 5\' 6"  (1.676 m)   Wt 185 lb (83.9 kg)   SpO2 98%   BMI 29.86 kg/m  , BMI Body mass index is 29.86 kg/m. GEN: Well nourished, well developed, in no acute distress  HEENT: normal  Neck: no JVD, carotid bruits, or masses Cardiac: RRR; no murmurs, rubs, or gallops,no edema  Respiratory:  clear to auscultation bilaterally, normal work of breathing GI: soft, nontender, nondistended, + BS MS: no deformity or atrophy  Skin: warm and dry, no rash Neuro:  Strength and sensation are intact Psych:  euthymic mood, full affect    Recent Labs: 03/03/2020: ALT 21; Hemoglobin 11.4; Platelets 223 03/04/2020: BUN 76; Creatinine, Ser 9.63; Potassium 4.4; Sodium 135    Lipid Panel Lab Results  Component Value Date   CHOL 198 05/17/2017   HDL 63 05/17/2017   LDLCALC 96 05/17/2017   TRIG 197 (H) 05/17/2017      Wt Readings from Last 3 Encounters:  03/08/20 185 lb (83.9 kg)  03/04/20 179 lb 10.8 oz (81.5 kg)  02/11/20 182 lb (82.6 kg)      ASSESSMENT AND PLAN:  ESRD on dialysis [facility]) - Plan: EKG 12-Lead Therapy on Monday Wednesday Friday She does have ascites on exam today, appears mild to moderate Stressed importance of low fluid intake,  Chronic obstructive pulmonary disease, unspecified COPD type ([place]) - Plan: EKG 12-Lead Long history of smoking  Smoking cessation recommended  Anemia in chronic kidney disease, on chronic dialysis ([place]) - Anemia managed by nephrology, appears stable  benign hypertensive renal disease - Plan: EKG 12-Lead LVH noted on echocardiogram,  Blood pressure stable, no medication changes made  Cardiac hypertrophy Secondary to longstanding hypertension  Ascites Fluid management by hemodialysis Long discussion concerning need to stay compliant with her dialysis episodes, compliant with her torsemide   Several hospital admissions records reviewed Long discussion with her concerning need to be compliant with dialysis, fluid restriction  Total encounter time more than 45 minutes  Greater than 50% was spent in counseling and coordination of care with the patient     Signed, Esmond Plants, M.D., Ph.D. 03/08/2020  [place], [place]

## 2020-03-07 NOTE — Telephone Encounter (Signed)
LVM for patient to call us and schedule a new patient appt after we receieved referral from the hospital for the [facility].      Janet Olson, [place]

## 2020-03-08 ENCOUNTER — Other Ambulatory Visit: Payer: Self-pay

## 2020-03-08 ENCOUNTER — Encounter: Payer: Self-pay | Admitting: Cardiovascular Disease

## 2020-03-08 ENCOUNTER — Ambulatory Visit (INDEPENDENT_AMBULATORY_CARE_PROVIDER_SITE_OTHER): Payer: Medicare Other | Admitting: Cardiovascular Disease

## 2020-03-08 VITALS — BP 140/80 | HR 82 | Ht 66.0 in | Wt 185.0 lb

## 2020-03-08 DIAGNOSIS — Z720 Tobacco use: Secondary | ICD-10-CM | POA: Diagnosis not present

## 2020-03-08 DIAGNOSIS — I5033 Acute on chronic diastolic (congestive) heart failure: Secondary | ICD-10-CM | POA: Diagnosis not present

## 2020-03-08 DIAGNOSIS — I10 Essential (primary) hypertension: Secondary | ICD-10-CM

## 2020-03-08 DIAGNOSIS — I272 Pulmonary hypertension, unspecified: Secondary | ICD-10-CM

## 2020-03-08 DIAGNOSIS — N186 End stage renal disease: Secondary | ICD-10-CM

## 2020-03-08 NOTE — Patient Instructions (Signed)

## 2020-03-18 ENCOUNTER — Encounter: Payer: Self-pay | Admitting: Internal Medicine

## 2020-03-19 DIAGNOSIS — R188 Other ascites: Secondary | ICD-10-CM

## 2020-04-29 ENCOUNTER — Other Ambulatory Visit: Payer: Self-pay | Admitting: Nephrology

## 2020-04-29 DIAGNOSIS — R188 Other ascites: Secondary | ICD-10-CM

## 2020-05-03 ENCOUNTER — Encounter: Payer: Self-pay | Admitting: *Deleted

## 2020-05-03 ENCOUNTER — Ambulatory Visit: Payer: Medicare Other | Admitting: Gastroenterology

## 2020-05-03 ENCOUNTER — Ambulatory Visit: Admission: RE | Admit: 2020-05-03 | Payer: Medicare Other | Source: Ambulatory Visit

## 2020-05-12 ENCOUNTER — Other Ambulatory Visit: Payer: Self-pay | Admitting: Nephrology

## 2020-05-12 DIAGNOSIS — R188 Other ascites: Secondary | ICD-10-CM

## 2020-05-17 ENCOUNTER — Other Ambulatory Visit: Payer: Self-pay | Admitting: Nephrology

## 2020-05-17 ENCOUNTER — Other Ambulatory Visit: Payer: Self-pay

## 2020-05-17 ENCOUNTER — Ambulatory Visit
Admission: RE | Admit: 2020-05-17 | Discharge: 2020-05-17 | Disposition: A | Discharge status: Home / Self Care | Payer: Medicare Other | Source: Ambulatory Visit | Attending: Nephrology | Admitting: Nephrology

## 2020-05-17 DIAGNOSIS — R188 Other ascites: Secondary | ICD-10-CM | POA: Insufficient documentation

## 2020-05-17 NOTE — Procedures (Signed)
PROCEDURE SUMMARY:  Successful US guided paracentesis from right lateral abdomen.  Yielded 4.1 liters of clear yellow fluid.  No immediate complications.  Patient tolerated well.  EBL = trace   Denis Carreon S Pari Lombard PA-C 05/17/2020 2:41 PM

## 2020-05-26 ENCOUNTER — Other Ambulatory Visit: Payer: Self-pay | Admitting: Family Medicine

## 2020-05-26 DIAGNOSIS — Z1231 Encounter for screening mammogram for malignant neoplasm of breast: Secondary | ICD-10-CM

## 2020-06-01 ENCOUNTER — Other Ambulatory Visit (INDEPENDENT_AMBULATORY_CARE_PROVIDER_SITE_OTHER): Payer: Self-pay | Admitting: Vascular Surgery

## 2020-06-01 DIAGNOSIS — N186 End stage renal disease: Secondary | ICD-10-CM

## 2020-06-02 ENCOUNTER — Encounter (INDEPENDENT_AMBULATORY_CARE_PROVIDER_SITE_OTHER): Payer: Self-pay | Admitting: Vascular Surgery

## 2020-06-02 ENCOUNTER — Ambulatory Visit (INDEPENDENT_AMBULATORY_CARE_PROVIDER_SITE_OTHER): Payer: Medicare Other | Admitting: Vascular Surgery

## 2020-06-02 ENCOUNTER — Ambulatory Visit (INDEPENDENT_AMBULATORY_CARE_PROVIDER_SITE_OTHER): Payer: Medicare Other

## 2020-06-02 ENCOUNTER — Other Ambulatory Visit: Payer: Self-pay

## 2020-06-02 VITALS — BP 132/77 | HR 64 | Ht 66.0 in | Wt 184.0 lb

## 2020-06-02 DIAGNOSIS — Z992 Dependence on renal dialysis: Secondary | ICD-10-CM

## 2020-06-02 DIAGNOSIS — T829XXS Unspecified complication of cardiac and vascular prosthetic device, implant and graft, sequela: Secondary | ICD-10-CM | POA: Diagnosis not present

## 2020-06-02 DIAGNOSIS — N186 End stage renal disease: Secondary | ICD-10-CM

## 2020-06-02 DIAGNOSIS — I5032 Chronic diastolic (congestive) heart failure: Secondary | ICD-10-CM | POA: Diagnosis not present

## 2020-06-02 DIAGNOSIS — J449 Chronic obstructive pulmonary disease, unspecified: Secondary | ICD-10-CM | POA: Diagnosis not present

## 2020-06-03 ENCOUNTER — Telehealth (INDEPENDENT_AMBULATORY_CARE_PROVIDER_SITE_OTHER): Payer: Self-pay

## 2020-06-03 NOTE — Telephone Encounter (Signed)
Spoke with the patient and she is now scheduled with Dr. Delana Meyer for a left arm fistulagram and permcath insertion on 06/14/20 with a 9:00 am arrival time to the [facility]. Covid testing is on 06/10/20 between 8-1 pm at the [place]. Pre-procedure instructions were discussed and  Mailed. Patient was offered to have her procedure on 06/07/20 but declined to do the covid test today.

## 2020-06-05 ENCOUNTER — Encounter (INDEPENDENT_AMBULATORY_CARE_PROVIDER_SITE_OTHER): Payer: Self-pay | Admitting: Vascular Surgery

## 2020-06-05 NOTE — Progress Notes (Signed)
MRN : [id number]  Janet Olson is a 50 y.o. (1970/01/16) female who presents with chief complaint of  Chief Complaint  Patient presents with  . Follow-up    U/S Follow up  .  History of Present Illness:  The patient returns to the office for follow up regarding problem with the dialysis access. Currently the patient is maintained via a left brachial cephalic fistula.  She is having increasing pain associated with increasing size of the aneurysms.    The patient notes an increase in bleeding time after decannulation.  The patient has also been informed that there is increased recirculation.   The patient denies hand pain or other symptoms consistent with steal phenomena.  No significant arm swelling.  The patient denies redness or swelling at the access site. The patient denies fever or chills at home or while on dialysis.  The patient denies amaurosis fugax or recent TIA symptoms. There are no recent neurological changes noted. The patient denies claudication symptoms or rest pain symptoms. The patient denies history of DVT, PE or superficial thrombophlebitis. The patient denies recent episodes of angina or shortness of breath.      Current Meds  Medication Sig  . albuterol (PROVENTIL) (2.5 MG/3ML) 0.083% nebulizer solution Inhale into the lungs every 6 (six) hours as needed.   Marland Kitchen albuterol (VENTOLIN HFA) 108 (90 Base) MCG/ACT inhaler Inhale 2 puffs into the lungs every 6 (six) hours as needed for wheezing.   Marland Kitchen amLODipine (NORVASC) 10 MG tablet Take 1 tablet (10 mg total) by mouth daily.  . benzonatate (TESSALON) 100 MG capsule Take 100 mg by mouth 2 (two) times daily as needed.  . calcium acetate (PHOSLO) 667 MG capsule Take 2 capsules (1,334 mg total) by mouth 3 (three) times daily with meals.  . chlorpheniramine-HYDROcodone ([place]) 10-8 MG/5ML SUER Take by mouth as needed.   . docusate sodium (COLACE) 100 MG capsule Take by mouth daily as needed.   . DULERA 200-5  MCG/ACT AERO Inhale 2 puffs into the lungs 2 (two) times daily.  . DULoxetine (CYMBALTA) 60 MG capsule Take 60 mg by mouth 2 (two) times daily.   . famotidine (PEPCID) 20 MG tablet Take 20 mg by mouth 2 (two) times daily.  . hydrOXYzine (ATARAX/VISTARIL) 25 MG tablet Take 25 mg by mouth daily.  Marland Kitchen lidocaine-prilocaine (EMLA) cream Apply 1 application topically daily as needed [facility] access for dialysis).  . magic mouthwash w/lidocaine SOLN Take 5 mLs by mouth 4 (four) times daily as needed for mouth pain.  . Melatonin 10 MG CAPS Take by mouth at bedtime as needed.   . metoprolol tartrate (LOPRESSOR) 50 MG tablet Take 50 mg by mouth 2 (two) times daily.  . multivitamin (RENA-VIT) TABS tablet Take 1 tablet by mouth daily.  . Nutritional Supplements (FEEDING SUPPLEMENT, NEPRO CARB STEADY,) LIQD Take 237 mLs by mouth 2 (two) times daily between meals.  Marland Kitchen oxyCODONE (OXY IR/ROXICODONE) 5 MG immediate release tablet Take by mouth as needed.   . pantoprazole (PROTONIX) 40 MG tablet Take 1 tablet (40 mg total) by mouth daily.  . ramelteon (ROZEREM) 8 MG tablet Take by mouth at bedtime.   . torsemide (DEMADEX) 100 MG tablet Take 1 tablet (100 mg total) by mouth daily.  Marland Kitchen triamcinolone cream (KENALOG) 0.5 % Apply topically as directed.   . Vitamin D, Ergocalciferol, (DRISDOL) 50000 units CAPS capsule Take 50,000 Units by mouth every Sunday.   . zolpidem (AMBIEN) 5 MG tablet Take by  mouth at bedtime as needed.     Past Medical History:  Diagnosis Date  . (HFpEF) heart failure with preserved ejection fraction ([place])    a. 2015 Echo: Nl EF. Mild to mod LVH. Mild PR/MR; b. 04/2017 Echo: EF 55-60%, no rwma, Gr1 DD. Mildly dil LA. Small effusion; c. 12/2018 Echo: EF 55-60%, mildly dil LA/PA. Mod dil RA. Mild to mod RV dil. Mildly reduced RV fxn. Mild TR. Mod elev PASP.  Marland Kitchen Acute hypoxemic respiratory failure ([place]) 01/03/2019  . Acute renal failure (ARF) ([place]) 05/17/2017  . Acute respiratory failure ([place]) 09/25/2018    . Anemia   . Asthma   . Cardiomegaly   . COPD (chronic obstructive pulmonary disease) ([place])   . Dialysis patient ([place])    MON, WED , FRI  . Dyspnea   . ESRD (end stage renal disease) ([place])   . GERD (gastroesophageal reflux disease)   . Headache    Migraines  . Hypertension   . Nonrheumatic tricuspid (valve) insufficiency   . Respiratory failure with hypoxia ([place]) 04/21/2018  . Ulcer   . Vitamin D deficiency     Past Surgical History:  Procedure Laterality Date  . A/V FISTULAGRAM Left 04/17/2018   Procedure: A/V FISTULAGRAM;  Surgeon: Algernon Huxley, MD;  Location: [place] [facility];  Service: Cardiovascular;  Laterality: Left;  . A/V FISTULAGRAM Left 05/27/2018   Procedure: A/V FISTULAGRAM;  Surgeon: Katha Cabal, MD;  Location: [place] [facility];  Service: Cardiovascular;  Laterality: Left;  . AV FISTULA PLACEMENT Left 08/08/2017   Procedure: ARTERIOVENOUS (AV) FISTULA CREATION ( BRACHIOCEPHALIC );  Surgeon: Algernon Huxley, MD;  Location: [facility] ORS;  Service: Vascular;  Laterality: Left;  . CESAREAN SECTION     x 4 (1988, 1991, 1997, 2000 )  . COLONOSCOPY WITH PROPOFOL N/A 04/24/2018   Procedure: COLONOSCOPY WITH PROPOFOL;  Surgeon: Lin Landsman, MD;  Location: [facility] ENDOSCOPY;  Service: Gastroenterology;  Laterality: N/A;  . DIALYSIS/PERMA CATHETER INSERTION N/A 07/16/2017   Procedure: Dialysis/Perma Catheter Insertion;  Surgeon: Katha Cabal, MD;  Location: [place] [facility];  Service: Cardiovascular;  Laterality: N/A;  . DIALYSIS/PERMA CATHETER REMOVAL N/A 10/29/2017   Procedure: DIALYSIS/PERMA CATHETER REMOVAL;  Surgeon: Katha Cabal, MD;  Location: [place] [facility];  Service: Cardiovascular;  Laterality: N/A;  . excision of lymph nodes Bilateral 2014   bilateral under arms.  Marland Kitchen PARATHYROID IMPLANT REMOVAL      Social History Social History   Tobacco Use  . Smoking status: Current Every Day Smoker    Packs/day: 0.50    Years: 30.00    Pack  years: 15.00    Types: Cigarettes  . Smokeless tobacco: Never Used  Substance Use Topics  . Alcohol use: No  . Drug use: No    Family History Family History  Problem Relation Age of Onset  . Diabetes Mother     No Known Allergies   REVIEW OF SYSTEMS (Negative unless checked)  Constitutional: [] Weight loss  [] Fever  [] Chills Cardiac: [] Chest pain   [] Chest pressure   [] Palpitations   [] Shortness of breath when laying flat   [] Shortness of breath with exertion. Vascular:  [] Pain in legs with walking   [] Pain in legs at rest  [] History of DVT   [] Phlebitis   [] Swelling in legs   [] Varicose veins   [] Non-healing ulcers Pulmonary:   [] Uses home oxygen   [] Productive cough   [] Hemoptysis   [] Wheeze  [] COPD   [] Asthma  Neurologic:  [] Dizziness   [] Seizures   [] History of stroke   [] History of TIA  [] Aphasia   [] Vissual changes   [] Weakness or numbness in arm   [] Weakness or numbness in leg Musculoskeletal:   [] Joint swelling   [x] Joint pain   [] Low back pain Hematologic:  [] Easy bruising  [] Easy bleeding   [] Hypercoagulable state   [] Anemic Gastrointestinal:  [] Diarrhea   [] Vomiting  [] Gastroesophageal reflux/heartburn   [] Difficulty swallowing. Genitourinary:  [x] Chronic kidney disease   [] Difficult urination  [] Frequent urination   [] Blood in urine Skin:  [] Rashes   [] Ulcers  Psychological:  [] History of anxiety   []  History of major depression.  Physical Examination  Vitals:   06/02/20 1526  BP: 132/77  Pulse: 64  Weight: 184 lb (83.5 kg)  Height: 5\' 6"  (1.676 m)   Body mass index is 29.7 kg/m. Gen: WD/WN, NAD Head: [place]/AT, No temporalis wasting.  Ear/Nose/Throat: Hearing grossly intact, nares w/o erythema or drainage Eyes: PER, EOMI, sclera nonicteric.  Neck: Supple, no large masses.   Pulmonary:  Good air movement, no audible wheezing bilaterally, no use of accessory muscles.  Cardiac: RRR, no JVD Vascular:  Left upper arm av fistula with larger 4+ cm aneurysm and  moderate pulsatility.  + bruit Vessel Right Left  Radial Palpable Palpable  Brachial Palpable Palpable  Gastrointestinal: Non-distended. No guarding/no peritoneal signs.  Musculoskeletal: M/S 5/5 throughout.  No deformity or atrophy.  Neurologic: CN 2-12 intact. Symmetrical.  Speech is fluent. Motor exam as listed above. Psychiatric: Judgment intact, Mood & affect appropriate for pt's clinical situation. Dermatologic: No rashes or ulcers noted.  No changes consistent with cellulitis.   CBC Lab Results  Component Value Date   WBC 4.1 03/03/2020   HGB 11.4 (L) 03/03/2020   HCT 33.9 (L) 03/03/2020   MCV 81.7 03/03/2020   PLT 223 03/03/2020    BMET    Component Value Date/Time   NA 135 03/04/2020 0713   NA 139 09/23/2014 1741   K 4.4 03/04/2020 0713   K 4.2 09/23/2014 1741   CL 95 (L) 03/04/2020 0713   CL 112 (H) 09/23/2014 1741   CO2 24 03/04/2020 0713   CO2 21 09/23/2014 1741   GLUCOSE 91 03/04/2020 0713   GLUCOSE 82 09/23/2014 1741   BUN 76 (H) 03/04/2020 0713   BUN 18 09/23/2014 1741   CREATININE 9.63 (H) 03/04/2020 0713   CREATININE 1.41 (H) 09/23/2014 1741   CALCIUM 8.0 (L) 03/04/2020 0713   CALCIUM 8.4 (L) 01/03/2019 0243   GFRNONAA 4 (L) 03/04/2020 0713   GFRNONAA 43 (L) 09/23/2014 1741   GFRNONAA 43 (L) 11/20/2012 1129   GFRAA 5 (L) 03/04/2020 0713   GFRAA 52 (L) 09/23/2014 1741   GFRAA 49 (L) 11/20/2012 1129   CrCl cannot be calculated (Patient's most recent lab result is older than the maximum 21 days allowed.).  COAG Lab Results  Component Value Date   INR 0.93 08/06/2017    Radiology US Paracentesis  Result Date: 05/17/2020 INDICATION: End-stage renal disease, congestive heart failure with ascites. Request for therapeutic paracentesis. EXAM: ULTRASOUND GUIDED PARACENTESIS MEDICATIONS: 1% lidocaine 10 mL COMPLICATIONS: None immediate. PROCEDURE: Informed written consent was obtained from the patient after a discussion of the risks, benefits and  alternatives to treatment. A timeout was performed prior to the initiation of the procedure. Initial ultrasound scanning demonstrates a large amount of ascites within the right lower abdominal quadrant. The right lower abdomen was prepped and draped in the usual sterile  fashion. 1% lidocaine was used for local anesthesia. Following this, a 19 gauge, 10-cm, Yueh catheter was introduced. An ultrasound image was saved for documentation purposes. The paracentesis was performed. The catheter was removed and a dressing was applied. The patient tolerated the procedure well without immediate post procedural complication. FINDINGS: A total of approximately 4.1 L of clear yellow fluid was removed. IMPRESSION: Successful ultrasound-guided paracentesis yielding 4.1 liters of peritoneal fluid. Read by: Gareth Eagle, PA-C Electronically Signed   By: Marcello Moores  Register   On: 05/17/2020 14:41   VAS [place] (AVF,AVG)  Result Date: 06/02/2020 DIALYSIS ACCESS History: 08/08/17: Left brachial-cephalic AVF;          5/40/08: Left proximal to mid upper arm cephalic vein PTA;          05/27/18: Left mid upper arm cephalic vein PTA with branch coil          embolization. Comparison Study: 12/01/2019 Performing Technologist: Concha Norway RVT  Examination Guidelines: A complete evaluation includes B-mode imaging, spectral Doppler, color Doppler, and power Doppler as needed of all accessible portions of each vessel. Unilateral testing is considered an integral part of a complete examination. Limited examinations for reoccurring indications may be performed as noted.  Findings: +--------------------+----------+-----------------+--------+ AVF                 PSV (cm/s)Flow Vol (mL/min)Comments +--------------------+----------+-----------------+--------+ Native artery inflow   303          3076                +--------------------+----------+-----------------+--------+ AVF Anastomosis        284                               +--------------------+----------+-----------------+--------+  +---------------+----------+-------------+----------+--------+ OUTFLOW VEIN   PSV (cm/s)Diameter (cm)Depth (cm)Describe +---------------+----------+-------------+----------+--------+ Subclavian vein   102                                    +---------------+----------+-------------+----------+--------+ Confluence        531                                    +---------------+----------+-------------+----------+--------+ Prox UA           140        0.83                        +---------------+----------+-------------+----------+--------+ Mid UA            114        2.36                        +---------------+----------+-------------+----------+--------+ Dist UA           108        1.11                        +---------------+----------+-------------+----------+--------+  +---------------+------------+----------+---------+--------+------------------+                  Diameter  Depth (cm)Branching  PSV      Flow Volume                        (cm)                        (  cm/s)      (ml/min)      +---------------+------------+----------+---------+--------+------------------+ Left Dis Rad                                     65                      Art                                                                      +---------------+------------+----------+---------+--------+------------------+ Antegrade                                                                +---------------+------------+----------+---------+--------+------------------+  Summary: Patent left brachiocephalic. AVF is aneurysmal and tortuous throughout. The anastomosis site shows improved flow and the confluence shows increased velocities compared to previous study.  *See table(s) above for measurements and observations.  Diagnosing physician: Hortencia Pilar MD Electronically signed by Hortencia Pilar MD  on 06/02/2020 at 4:51:57 PM.    --------------------------------------------------------------------------------   Final      Assessment/Plan 1. Complication from renal dialysis device, sequela Recommend:  The patient is experiencing increasing problems with their dialysis access.  Patient should have a fistulagram with the intention for planning a revision with Artegraft.  The intention for surgery is to restore appropriate flow and prevent thrombosis and possible loss of the access.  As well as improve the quality of dialysis therapy.    This will require insertion of a tunneled catheter temporarily.  The risks, benefits and alternative therapies were reviewed in detail with the patient.  All questions were answered.  The patient agrees to proceed with angio/intervention.    2. ESRD on dialysis ([place]) At the present time the patient does not have adequate dialysis access. Arrangements will be made to correct this as noted above.  Continue hemodialysis as ordered without interruption.  Avoid nephrotoxic medications and dehydration.  Further plans per nephrology  3. Chronic obstructive pulmonary disease, unspecified COPD type ([place]) Continue pulmonary medications and aerosols as already ordered, these medications have been reviewed and there are no changes at this time.    4. Diastolic dysfunction with chronic heart failure (HCC) Continue cardiac and antihypertensive medications as already ordered and reviewed, no changes at this time.  Continue statin as ordered and reviewed, no changes at this time  Nitrates PRN for chest pain     Hortencia Pilar, MD  06/05/2020 3:21 PM

## 2020-06-10 ENCOUNTER — Other Ambulatory Visit: Admission: RE | Admit: 2020-06-10 | Payer: Medicare Other | Source: Ambulatory Visit

## 2020-06-10 ENCOUNTER — Telehealth (INDEPENDENT_AMBULATORY_CARE_PROVIDER_SITE_OTHER): Payer: Self-pay

## 2020-06-10 NOTE — Telephone Encounter (Signed)
Patient called and left a message to cancel her angio scheduled with Dr. Delana Meyer for 06/14/20 due to family issues. I attempted to contact the patient to reschedule her and a was told she was at dialysis. I will contact the patient again on Monday.

## 2020-06-13 ENCOUNTER — Other Ambulatory Visit (INDEPENDENT_AMBULATORY_CARE_PROVIDER_SITE_OTHER): Payer: Self-pay | Admitting: Nurse Practitioner

## 2020-06-14 ENCOUNTER — Ambulatory Visit: Admit: 2020-06-14 | Payer: Medicare Other | Admitting: Vascular Surgery

## 2020-06-14 SURGERY — A/V FISTULAGRAM
Anesthesia: Moderate Sedation

## 2020-06-16 ENCOUNTER — Telehealth (INDEPENDENT_AMBULATORY_CARE_PROVIDER_SITE_OTHER): Payer: Self-pay

## 2020-06-16 NOTE — Telephone Encounter (Signed)
Spoke with the patient to get her rescheduled for her fistulagram and permcath insertion with Dr. Delana Meyer. Patient declined 06/21/20 and 06/28/20. Patient is scheduled on 07/05/20 with a 6:45 am arrival time to the [facility]. Patient will do covid testing on 07/01/20 between 8-1 pm at the [facility]. Pre-procedure instructions will be mailed.

## 2020-06-28 ENCOUNTER — Ambulatory Visit: Admission: RE | Admit: 2020-06-28 | Payer: Medicare Other | Source: Ambulatory Visit

## 2020-06-28 ENCOUNTER — Other Ambulatory Visit (INDEPENDENT_AMBULATORY_CARE_PROVIDER_SITE_OTHER): Payer: Self-pay | Admitting: Nurse Practitioner

## 2020-07-01 ENCOUNTER — Other Ambulatory Visit: Payer: Self-pay

## 2020-07-01 ENCOUNTER — Other Ambulatory Visit
Admission: RE | Admit: 2020-07-01 | Discharge: 2020-07-01 | Disposition: A | Discharge status: Home / Self Care | Payer: Medicare Other | Source: Ambulatory Visit | Attending: Vascular Surgery | Admitting: Vascular Surgery

## 2020-07-01 DIAGNOSIS — Z20822 Contact with and (suspected) exposure to covid-19: Secondary | ICD-10-CM | POA: Diagnosis not present

## 2020-07-01 DIAGNOSIS — Z01812 Encounter for preprocedural laboratory examination: Secondary | ICD-10-CM | POA: Insufficient documentation

## 2020-07-01 LAB — SARS CORONAVIRUS 2 (TAT 6-24 HRS): SARS Coronavirus 2: NEGATIVE

## 2020-07-05 ENCOUNTER — Encounter: Payer: Self-pay | Admitting: Vascular Surgery

## 2020-07-05 ENCOUNTER — Encounter
Admission: RE | Disposition: A | Discharge status: Home / Self Care | Payer: Self-pay | Source: Home / Self Care | Attending: Vascular Surgery

## 2020-07-05 ENCOUNTER — Encounter: Payer: Self-pay | Admitting: Anesthesiology

## 2020-07-05 ENCOUNTER — Ambulatory Visit
Admission: RE | Admit: 2020-07-05 | Discharge: 2020-07-05 | Disposition: A | Discharge status: Home / Self Care | Payer: Medicare Other | Attending: Vascular Surgery | Admitting: Vascular Surgery

## 2020-07-05 ENCOUNTER — Other Ambulatory Visit: Payer: Self-pay

## 2020-07-05 DIAGNOSIS — J449 Chronic obstructive pulmonary disease, unspecified: Secondary | ICD-10-CM | POA: Diagnosis not present

## 2020-07-05 DIAGNOSIS — F17210 Nicotine dependence, cigarettes, uncomplicated: Secondary | ICD-10-CM | POA: Diagnosis not present

## 2020-07-05 DIAGNOSIS — I5032 Chronic diastolic (congestive) heart failure: Secondary | ICD-10-CM | POA: Diagnosis not present

## 2020-07-05 DIAGNOSIS — Z7951 Long term (current) use of inhaled steroids: Secondary | ICD-10-CM | POA: Diagnosis not present

## 2020-07-05 DIAGNOSIS — Y832 Surgical operation with anastomosis, bypass or graft as the cause of abnormal reaction of the patient, or of later complication, without mention of misadventure at the time of the procedure: Secondary | ICD-10-CM | POA: Diagnosis not present

## 2020-07-05 DIAGNOSIS — I770 Arteriovenous fistula, acquired: Secondary | ICD-10-CM | POA: Diagnosis not present

## 2020-07-05 DIAGNOSIS — Z79899 Other long term (current) drug therapy: Secondary | ICD-10-CM | POA: Diagnosis not present

## 2020-07-05 DIAGNOSIS — I132 Hypertensive heart and chronic kidney disease with heart failure and with stage 5 chronic kidney disease, or end stage renal disease: Secondary | ICD-10-CM | POA: Diagnosis not present

## 2020-07-05 DIAGNOSIS — N179 Acute kidney failure, unspecified: Secondary | ICD-10-CM | POA: Diagnosis not present

## 2020-07-05 DIAGNOSIS — K219 Gastro-esophageal reflux disease without esophagitis: Secondary | ICD-10-CM | POA: Insufficient documentation

## 2020-07-05 DIAGNOSIS — T82898A Other specified complication of vascular prosthetic devices, implants and grafts, initial encounter: Secondary | ICD-10-CM | POA: Diagnosis present

## 2020-07-05 DIAGNOSIS — N186 End stage renal disease: Secondary | ICD-10-CM | POA: Diagnosis not present

## 2020-07-05 DIAGNOSIS — Z992 Dependence on renal dialysis: Secondary | ICD-10-CM | POA: Diagnosis not present

## 2020-07-05 HISTORY — PX: A/V FISTULAGRAM: CATH118298

## 2020-07-05 LAB — GLUCOSE, CAPILLARY: Glucose-Capillary: 70 mg/dL (ref 70–99)

## 2020-07-05 LAB — POTASSIUM (ARMC VASCULAR LAB ONLY): Potassium (ARMC vascular lab): 4.4 (ref 3.5–5.1)

## 2020-07-05 SURGERY — A/V FISTULAGRAM
Anesthesia: Moderate Sedation | Laterality: Left

## 2020-07-05 MED ORDER — MIDAZOLAM HCL 2 MG/ML PO SYRP: 8.0000 mg | ORAL_SOLUTION | Freq: Once | ORAL | Status: DC | PRN

## 2020-07-05 MED ORDER — FENTANYL CITRATE (PF) 100 MCG/2ML IJ SOLN
INTRAMUSCULAR | Status: DC | PRN
  Administered 2020-07-05 (×2): 25 ug via INTRAVENOUS
  Administered 2020-07-05: 50 ug via INTRAVENOUS

## 2020-07-05 MED ORDER — FAMOTIDINE 20 MG PO TABS: 40.0000 mg | ORAL_TABLET | Freq: Once | ORAL | Status: DC | PRN

## 2020-07-05 MED ORDER — SODIUM CHLORIDE 0.9 % IV SOLN: INTRAVENOUS | Status: DC

## 2020-07-05 MED ORDER — ONDANSETRON HCL 4 MG/2ML IJ SOLN: 4.0000 mg | Freq: Four times a day (QID) | INTRAMUSCULAR | Status: DC | PRN

## 2020-07-05 MED ORDER — MIDAZOLAM HCL 5 MG/5ML IJ SOLN
INTRAMUSCULAR | Status: AC
  Filled 2020-07-05: qty 5

## 2020-07-05 MED ORDER — CEFAZOLIN SODIUM-DEXTROSE 1-4 GM/50ML-% IV SOLN
INTRAVENOUS | Status: AC
  Administered 2020-07-05: 1 g via INTRAVENOUS
  Filled 2020-07-05: qty 50

## 2020-07-05 MED ORDER — FENTANYL CITRATE (PF) 100 MCG/2ML IJ SOLN
INTRAMUSCULAR | Status: AC
  Filled 2020-07-05: qty 2

## 2020-07-05 MED ORDER — METHYLPREDNISOLONE SODIUM SUCC 125 MG IJ SOLR: 125.0000 mg | Freq: Once | INTRAMUSCULAR | Status: DC | PRN

## 2020-07-05 MED ORDER — DIPHENHYDRAMINE HCL 50 MG/ML IJ SOLN: 50.0000 mg | Freq: Once | INTRAMUSCULAR | Status: DC | PRN

## 2020-07-05 MED ORDER — HEPARIN SODIUM (PORCINE) 1000 UNIT/ML IJ SOLN
INTRAMUSCULAR | Status: AC
  Filled 2020-07-05: qty 1

## 2020-07-05 MED ORDER — CEFAZOLIN SODIUM-DEXTROSE 2-4 GM/100ML-% IV SOLN: 2.0000 g | Freq: Once | INTRAVENOUS | Status: DC

## 2020-07-05 MED ORDER — MIDAZOLAM HCL 2 MG/2ML IJ SOLN
INTRAMUSCULAR | Status: DC | PRN
  Administered 2020-07-05: 0.5 mg via INTRAVENOUS
  Administered 2020-07-05: 2 mg via INTRAVENOUS
  Administered 2020-07-05: 0.5 mg via INTRAVENOUS

## 2020-07-05 MED ORDER — CEFAZOLIN SODIUM-DEXTROSE 1-4 GM/50ML-% IV SOLN: 1.0000 g | Freq: Once | INTRAVENOUS | Status: AC

## 2020-07-05 MED ORDER — LIDOCAINE HCL (PF) 1 % IJ SOLN
INTRAMUSCULAR | Status: AC
  Filled 2020-07-05: qty 30

## 2020-07-05 MED ORDER — HYDROMORPHONE HCL 1 MG/ML IJ SOLN: 1.0000 mg | Freq: Once | INTRAMUSCULAR | Status: DC | PRN

## 2020-07-05 SURGICAL SUPPLY — 10 items
CATH CANNON HEMO 15FR 19 (HEMODIALYSIS SUPPLIES) ×2 IMPLANT
DERMABOND ADVANCED (GAUZE/BANDAGES/DRESSINGS) ×1
DERMABOND ADVANCED .7 DNX12 (GAUZE/BANDAGES/DRESSINGS) ×1 IMPLANT
NEEDLE ENTRY 21GA 7CM ECHOTIP (NEEDLE) ×2 IMPLANT
PACK ANGIOGRAPHY (CUSTOM PROCEDURE TRAY) ×2 IMPLANT
SET INTRO CAPELLA COAXIAL (SET/KITS/TRAYS/PACK) ×2 IMPLANT
SHEATH BRITE TIP 6FRX5.5 (SHEATH) ×2 IMPLANT
SUT MNCRL AB 4-0 PS2 18 (SUTURE) ×4 IMPLANT
SUT SILK 0 FSL (SUTURE) ×2 IMPLANT
WIRE J 3MM .035X145CM (WIRE) ×2 IMPLANT

## 2020-07-05 NOTE — Op Note (Signed)
OPERATIVE NOTE   PROCEDURE: 1. Contrast injection left brachiocephalic fistula. 2. Insertion of a tunneled dialysis catheter with ultrasound and fluoroscopic guidance  PRE-OPERATIVE DIAGNOSIS: Complication of dialysis access                                                       End Stage Renal Disease  POST-OPERATIVE DIAGNOSIS: same as above   SURGEON: Katha Cabal, M.D.  ANESTHESIA: Conscious sedation was administered under my direct supervision by the interventional radiology RN.  IV Versed plus fentanyl were utilized. Continuous ECG, pulse oximetry and blood pressure was monitored throughout the entire procedure.  Conscious sedation was for a total of 50 minutes.  ESTIMATED BLOOD LOSS: minimal  FINDING(S): 1. Large aneurysms noted within the body of the fistula.  At the cephalic subclavian confluence there is a greater than 80% narrowing within the cephalic vein.  Central veins are widely patent.  Arterial anastomosis is widely patent  SPECIMEN(S):  None  CONTRAST: 20 cc  FLUOROSCOPY TIME: 1.3 minutes  INDICATIONS: Janet Olson is a 50 y.o. female who  presents with malfunctioning left arm AV access.  The patient is scheduled for angiography with possible intervention of the AV access to prevent loss of the permanent access.  The patient is aware the risks include but are not limited to: bleeding, infection, thrombosis of the cannulated access, and possible anaphylactic reaction to the contrast.  The patient acknowledges if the access can not be salvaged a tunneled catheter will be needed and will be placed during this procedure.  The patient is aware of the risks of the procedure and elects to proceed with the angiogram and intervention.  DESCRIPTION: After full informed written consent was obtained, the patient was brought back to the Special Procedure suite and placed supine position.  Appropriate cardiopulmonary monitors were placed.  The left arm was prepped and  draped in the standard fashion.  Appropriate timeout is called.   The left brachiocephalic access was cannulated with a micropuncture needle under ultrasound guidence.  Ultrasound was used to evaluate the left brachiocephalic access.  It was echolucent and compressible indicating it is patent .  An ultrasound image was acquired for the permanent record.  A micropuncture needle was used to access the left brachiocephalic access under direct ultrasound guidance.  The microwire was then advanced under fluoroscopic guidance without difficulty followed by the micro-sheath.  The J-wire was then advanced and a 6 Fr sheath inserted.  Hand injections were completed to image the access from the arterial anastomosis through the entire access.  The central venous structures were also imaged by hand injections.  Based on the images, Large aneurysms noted within the body of the fistula.  At the cephalic subclavian confluence there is a greater than 80% narrowing within the cephalic vein.  Central veins are widely patent.  Arterial anastomosis is widely patent.  I elected to move forward with placement of the tunneled cath as an interim access.    A 4-0 Monocryl purse-string suture was sewn around the sheath.  The sheath was removed and light pressure was applied.  A sterile bandage was applied to the puncture site.  The right neck and chest wall was then prepped and draped in sterile fashion.  Ultrasound was returned to the field in a sterile sleeve.  Ultrasound  was used to evaluate the right internal jugular vein.  It is echolucent and compressible indicating patency.  Images recorded for the permanent record.  1% lidocaine is then infiltrated into the base of the neck and the chest wall.  A micro puncture needle is then used to access the right internal jugular vein under direct ultrasound visualization.  Microwire followed by microsheath followed by the J-wire is then inserted under fluoroscopic guidance.  Small  incision is made at the wire insertion site and the dilators are then used followed by placing the peel-away sheath.  A 19 cm tip to cuff Cannon catheter is then advanced through the peel-away sheath and the peel-away sheath is removed.  Under fluoroscopic guidance the tips were positioned at the atrial caval junction and the catheter is approximated to the chest wall.  Lidocaine is then infiltrated.  A small incision is created with the 11 blade scalpel and the tunneling device is advanced from the exit site to the neck insertion site.  The tunnel was then prepped with the dilating device and then the catheter was pulled subcutaneously.  It is transected and the hub assembly is connected without difficulty.  Both lumens aspirate and flush easily.  Both lumens were then packed with 5000 units of heparin.  The neck counterincision was then closed with a Monocryl subcuticular pursestring suture was placed around the exit site with the Monocryl.  Catheter secured to the chest wall with 0 silk interrupted suture on both sides of the hub.  Dermabond is applied to the neck and a sterile dressing including a Biopatch is applied to the catheter exit site.  Final imaging demonstrates the tips are in good position of the contour the catheter smooth and free of kinks.    COMPLICATIONS: None  CONDITION: [name], M.D [facility] Office: [phone]  07/05/2020 9:36 AM

## 2020-07-05 NOTE — H&P (Addendum)
@LOGO @   MRN : [id number]  Janet Olson is a 50 y.o. (1970/07/30) female who presents with chief complaint of painful access.  History of Present Illness:   The patient presents to [facility] for evaluation of her fistula and plans to revise the access. Currently the patient is maintained via a left brachial cephalic fistula.  She is having increasing pain associated with increasing size of the aneurysms.    The patient notes an increase in bleeding time after decannulation.  The patient has also been informed that there is increased recirculation.   The patient denies hand pain or other symptoms consistent with steal phenomena.  No significant arm swelling.  The patient denies redness or swelling at the access site. The patient denies fever or chills at home or while on dialysis.  The patient denies amaurosis fugax or recent TIA symptoms. There are no recent neurological changes noted. The patient denies claudication symptoms or rest pain symptoms. The patient denies history of DVT, PE or superficial thrombophlebitis. The patient denies recent episodes of angina or shortness of breath.   Current Meds  Medication Sig  . albuterol (PROVENTIL) (2.5 MG/3ML) 0.083% nebulizer solution Inhale into the lungs every 6 (six) hours as needed.   Marland Kitchen albuterol (VENTOLIN HFA) 108 (90 Base) MCG/ACT inhaler Inhale 2 puffs into the lungs every 6 (six) hours as needed for wheezing.   Marland Kitchen amLODipine (NORVASC) 10 MG tablet Take 1 tablet (10 mg total) by mouth daily.  . benzonatate (TESSALON) 100 MG capsule Take 100 mg by mouth 2 (two) times daily as needed.  . calcium acetate (PHOSLO) 667 MG capsule Take 2 capsules (1,334 mg total) by mouth 3 (three) times daily with meals.  . chlorpheniramine-HYDROcodone ([place]) 10-8 MG/5ML SUER Take by mouth as needed.   . docusate sodium (COLACE) 100 MG capsule Take by mouth daily as needed.   . DULERA 200-5 MCG/ACT AERO Inhale 2 puffs into the lungs 2 (two) times daily.   . DULoxetine (CYMBALTA) 60 MG capsule Take 60 mg by mouth 2 (two) times daily.   . famotidine (PEPCID) 20 MG tablet Take 20 mg by mouth 2 (two) times daily.  . hydrOXYzine (ATARAX/VISTARIL) 25 MG tablet Take 25 mg by mouth daily.  Marland Kitchen lidocaine-prilocaine (EMLA) cream Apply 1 application topically daily as needed [facility] access for dialysis).  . Melatonin 10 MG CAPS Take by mouth at bedtime as needed.   . metoprolol tartrate (LOPRESSOR) 50 MG tablet Take 50 mg by mouth 2 (two) times daily.  . multivitamin (RENA-VIT) TABS tablet Take 1 tablet by mouth daily.  Marland Kitchen oxyCODONE (OXY IR/ROXICODONE) 5 MG immediate release tablet Take by mouth as needed.   . pantoprazole (PROTONIX) 40 MG tablet Take 1 tablet (40 mg total) by mouth daily.  Marland Kitchen torsemide (DEMADEX) 100 MG tablet Take 1 tablet (100 mg total) by mouth daily.  Marland Kitchen triamcinolone cream (KENALOG) 0.5 % Apply topically as directed.   . Vitamin D, Ergocalciferol, (DRISDOL) 50000 units CAPS capsule Take 50,000 Units by mouth every Sunday.   . zolpidem (AMBIEN) 5 MG tablet Take by mouth at bedtime as needed.     Past Medical History:  Diagnosis Date  . (HFpEF) heart failure with preserved ejection fraction ([place])    a. 2015 Echo: Nl EF. Mild to mod LVH. Mild PR/MR; b. 04/2017 Echo: EF 55-60%, no rwma, Gr1 DD. Mildly dil LA. Small effusion; c. 12/2018 Echo: EF 55-60%, mildly dil LA/PA. Mod dil RA. Mild to mod RV dil. Mildly  reduced RV fxn. Mild TR. Mod elev PASP.  Marland Kitchen Acute hypoxemic respiratory failure ([place]) 01/03/2019  . Acute renal failure (ARF) ([place]) 05/17/2017  . Acute respiratory failure ([place]) 09/25/2018  . Anemia   . Asthma   . Cardiomegaly   . COPD (chronic obstructive pulmonary disease) ([place])   . Dialysis patient ([place])    MON, WED , FRI  . Dyspnea   . ESRD (end stage renal disease) ([place])   . GERD (gastroesophageal reflux disease)   . Headache    Migraines  . Hypertension   . Nonrheumatic tricuspid (valve) insufficiency   . Respiratory failure  with hypoxia ([place]) 04/21/2018  . Ulcer   . Vitamin D deficiency     Past Surgical History:  Procedure Laterality Date  . A/V FISTULAGRAM Left 04/17/2018   Procedure: A/V FISTULAGRAM;  Surgeon: Algernon Huxley, MD;  Location: [place] [facility];  Service: Cardiovascular;  Laterality: Left;  . A/V FISTULAGRAM Left 05/27/2018   Procedure: A/V FISTULAGRAM;  Surgeon: Katha Cabal, MD;  Location: [place] [facility];  Service: Cardiovascular;  Laterality: Left;  . AV FISTULA PLACEMENT Left 08/08/2017   Procedure: ARTERIOVENOUS (AV) FISTULA CREATION ( BRACHIOCEPHALIC );  Surgeon: Algernon Huxley, MD;  Location: [facility] ORS;  Service: Vascular;  Laterality: Left;  . CESAREAN SECTION     x 4 (1988, 1991, 1997, 2000 )  . COLONOSCOPY WITH PROPOFOL N/A 04/24/2018   Procedure: COLONOSCOPY WITH PROPOFOL;  Surgeon: Lin Landsman, MD;  Location: [facility] ENDOSCOPY;  Service: Gastroenterology;  Laterality: N/A;  . DIALYSIS/PERMA CATHETER INSERTION N/A 07/16/2017   Procedure: Dialysis/Perma Catheter Insertion;  Surgeon: Katha Cabal, MD;  Location: [place] [facility];  Service: Cardiovascular;  Laterality: N/A;  . DIALYSIS/PERMA CATHETER REMOVAL N/A 10/29/2017   Procedure: DIALYSIS/PERMA CATHETER REMOVAL;  Surgeon: Katha Cabal, MD;  Location: [place] [facility];  Service: Cardiovascular;  Laterality: N/A;  . excision of lymph nodes Bilateral 2014   bilateral under arms.  Marland Kitchen PARATHYROID IMPLANT REMOVAL      Social History Social History   Tobacco Use  . Smoking status: Current Every Day Smoker    Packs/day: 0.50    Years: 30.00    Pack years: 15.00    Types: Cigarettes  . Smokeless tobacco: Never Used  Vaping Use  . Vaping Use: Former  Substance Use Topics  . Alcohol use: No  . Drug use: No    Family History Family History  Problem Relation Age of Onset  . Diabetes Mother     No Known Allergies   REVIEW OF SYSTEMS (Negative unless checked)  Constitutional: [] Weight loss   [] Fever  [] Chills Cardiac: [] Chest pain   [] Chest pressure   [] Palpitations   [] Shortness of breath when laying flat   [] Shortness of breath with exertion. Vascular:  [] Pain in legs with walking   [] Pain in legs at rest  [] History of DVT   [] Phlebitis   [] Swelling in legs   [] Varicose veins   [] Non-healing ulcers Pulmonary:   [] Uses home oxygen   [] Productive cough   [] Hemoptysis   [] Wheeze  [] COPD   [] Asthma Neurologic:  [] Dizziness   [] Seizures   [] History of stroke   [] History of TIA  [] Aphasia   [] Vissual changes   [] Weakness or numbness in arm   [] Weakness or numbness in leg Musculoskeletal:   [] Joint swelling   [] Joint pain   [] Low back pain Hematologic:  [] Easy bruising  [] Easy bleeding   [] Hypercoagulable state   []   Anemic Gastrointestinal:  [] Diarrhea   [] Vomiting  [] Gastroesophageal reflux/heartburn   [] Difficulty swallowing. Genitourinary:  [x] Chronic kidney disease   [] Difficult urination  [] Frequent urination   [] Blood in urine Skin:  [] Rashes   [] Ulcers  Psychological:  [] History of anxiety   []  History of major depression.  Physical Examination  Vitals:   07/05/20 0730  BP: (!) 152/88  Pulse: 73  Resp: 19  Temp: 97.6 F (36.4 C)  TempSrc: Oral  SpO2: 100%  Weight: 79.4 kg  Height: 5\' 6"  (1.676 m)   Body mass index is 28.25 kg/m. Gen: WD/WN, NAD Head: [place]/AT, No temporalis wasting.  Ear/Nose/Throat: Hearing grossly intact, nares w/o erythema or drainage Eyes: PER, EOMI, sclera nonicteric.  Neck: Supple, no large masses.   Pulmonary:  Good air movement, no audible wheezing bilaterally, no use of accessory muscles.  Cardiac: RRR, no JVD Vascular: left brachial cephalic fistula with marked aneurysms and it is tender to palpation.  Good thrill and bruit Vessel Right Left  Radial Palpable Palpable  Gastrointestinal: Non-distended. No guarding/no peritoneal signs.  Musculoskeletal: M/S 5/5 throughout.  No deformity or atrophy.  Neurologic: CN 2-12 intact. Symmetrical.   Speech is fluent. Motor exam as listed above. Psychiatric: Judgment intact, Mood & affect appropriate for pt's clinical situation. Dermatologic: No rashes or ulcers noted.  No changes consistent with cellulitis. Lymph : No lichenification or skin changes of chronic lymphedema.  CBC Lab Results  Component Value Date   WBC 4.1 03/03/2020   HGB 11.4 (L) 03/03/2020   HCT 33.9 (L) 03/03/2020   MCV 81.7 03/03/2020   PLT 223 03/03/2020    BMET    Component Value Date/Time   NA 135 03/04/2020 0713   NA 139 09/23/2014 1741   K 4.4 03/04/2020 0713   K 4.2 09/23/2014 1741   CL 95 (L) 03/04/2020 0713   CL 112 (H) 09/23/2014 1741   CO2 24 03/04/2020 0713   CO2 21 09/23/2014 1741   GLUCOSE 91 03/04/2020 0713   GLUCOSE 82 09/23/2014 1741   BUN 76 (H) 03/04/2020 0713   BUN 18 09/23/2014 1741   CREATININE 9.63 (H) 03/04/2020 0713   CREATININE 1.41 (H) 09/23/2014 1741   CALCIUM 8.0 (L) 03/04/2020 0713   CALCIUM 8.4 (L) 01/03/2019 0243   GFRNONAA 4 (L) 03/04/2020 0713   GFRNONAA 43 (L) 09/23/2014 1741   GFRNONAA 43 (L) 11/20/2012 1129   GFRAA 5 (L) 03/04/2020 0713   GFRAA 52 (L) 09/23/2014 1741   GFRAA 49 (L) 11/20/2012 1129   CrCl cannot be calculated (Patient's most recent lab result is older than the maximum 21 days allowed.).  COAG Lab Results  Component Value Date   INR 0.93 08/06/2017    Radiology No results found.    Assessment/Plan 1. Complication from renal dialysis device, sequela Recommend:  The patient is experiencing increasing problems with their dialysis access.  Patient should have a fistulagram with the intention for planning a revision with Artegraft.  The intention for surgery is to restore appropriate flow and prevent thrombosis and possible loss of the access.  As well as improve the quality of dialysis therapy.    This will require insertion of a tunneled catheter temporarily.  The risks, benefits and alternative therapies were reviewed in detail  with the patient.  All questions were answered.  The patient agrees to proceed with angio/intervention.    2. ESRD on dialysis ([place]) At the present time the patient does not have adequate dialysis access. Arrangements will be  made to correct this as noted above.  Continue hemodialysis as ordered without interruption.  Avoid nephrotoxic medications and dehydration.  Further plans per nephrology  3. Chronic obstructive pulmonary disease, unspecified COPD type ([place]) Continue pulmonary medications and aerosols as already ordered, these medications have been reviewed and there are no changes at this time.    4. Diastolic dysfunction with chronic heart failure (HCC) Continue cardiac and antihypertensive medications as already ordered and reviewed, no changes at this time.  Continue statin as ordered and reviewed, no changes at this time  Nitrates PRN for chest pain   Hortencia Pilar, MD  07/05/2020 8:07 AM

## 2020-07-07 ENCOUNTER — Other Ambulatory Visit: Payer: Self-pay

## 2020-07-07 ENCOUNTER — Ambulatory Visit
Admission: RE | Admit: 2020-07-07 | Discharge: 2020-07-07 | Disposition: A | Discharge status: Home / Self Care | Payer: Medicare Other | Source: Ambulatory Visit | Attending: Nephrology | Admitting: Nephrology

## 2020-07-07 DIAGNOSIS — R188 Other ascites: Secondary | ICD-10-CM | POA: Diagnosis present

## 2020-07-20 NOTE — Progress Notes (Signed)
MRN : [id number]  Janet Olson is a 50 y.o. (10/28/1970) female who presents with chief complaint of No chief complaint on file. Marland Kitchen  History of Present Illness:    The patient is seen for evaluation of dialysis access.  The patient has a history of multiple failed accesses.  There have been accesses in both arms and in the thighs.    Current access is via a catheter which is functioning poorly.  There have been several episodes of catheter infection.  The patient denies amaurosis fugax or recent TIA symptoms. There are no recent neurological changes noted. The patient denies claudication symptoms or rest pain symptoms. The patient denies history of DVT, PE or superficial thrombophlebitis. The patient denies recent episodes of angina or shortness of breath.    No outpatient medications have been marked as taking for the 07/21/20 encounter (Appointment) with Delana Meyer, Dolores Lory, MD.    Past Medical History:  Diagnosis Date  . (HFpEF) heart failure with preserved ejection fraction ([place])    a. 2015 Echo: Nl EF. Mild to mod LVH. Mild PR/MR; b. 04/2017 Echo: EF 55-60%, no rwma, Gr1 DD. Mildly dil LA. Small effusion; c. 12/2018 Echo: EF 55-60%, mildly dil LA/PA. Mod dil RA. Mild to mod RV dil. Mildly reduced RV fxn. Mild TR. Mod elev PASP.  Marland Kitchen Acute hypoxemic respiratory failure ([place]) 01/03/2019  . Acute renal failure (ARF) ([place]) 05/17/2017  . Acute respiratory failure ([place]) 09/25/2018  . Anemia   . Asthma   . Cardiomegaly   . COPD (chronic obstructive pulmonary disease) ([place])   . Dialysis patient ([place])    MON, WED , FRI  . Dyspnea   . ESRD (end stage renal disease) ([place])   . GERD (gastroesophageal reflux disease)   . Headache    Migraines  . Hypertension   . Nonrheumatic tricuspid (valve) insufficiency   . Respiratory failure with hypoxia ([place]) 04/21/2018  . Ulcer   . Vitamin D deficiency     Past Surgical History:  Procedure Laterality Date  . A/V FISTULAGRAM Left 04/17/2018    Procedure: A/V FISTULAGRAM;  Surgeon: Algernon Huxley, MD;  Location: [place] [facility];  Service: Cardiovascular;  Laterality: Left;  . A/V FISTULAGRAM Left 05/27/2018   Procedure: A/V FISTULAGRAM;  Surgeon: Katha Cabal, MD;  Location: [place] [facility];  Service: Cardiovascular;  Laterality: Left;  . A/V FISTULAGRAM Left 07/05/2020   Procedure: A/V FISTULAGRAM;  Surgeon: Katha Cabal, MD;  Location: [place] [facility];  Service: Cardiovascular;  Laterality: Left;  . AV FISTULA PLACEMENT Left 08/08/2017   Procedure: ARTERIOVENOUS (AV) FISTULA CREATION ( BRACHIOCEPHALIC );  Surgeon: Algernon Huxley, MD;  Location: [facility] ORS;  Service: Vascular;  Laterality: Left;  . CESAREAN SECTION     x 4 (1988, 1991, 1997, 2000 )  . COLONOSCOPY WITH PROPOFOL N/A 04/24/2018   Procedure: COLONOSCOPY WITH PROPOFOL;  Surgeon: Lin Landsman, MD;  Location: [facility] ENDOSCOPY;  Service: Gastroenterology;  Laterality: N/A;  . DIALYSIS/PERMA CATHETER INSERTION N/A 07/16/2017   Procedure: Dialysis/Perma Catheter Insertion;  Surgeon: Katha Cabal, MD;  Location: [place] [facility];  Service: Cardiovascular;  Laterality: N/A;  . DIALYSIS/PERMA CATHETER REMOVAL N/A 10/29/2017   Procedure: DIALYSIS/PERMA CATHETER REMOVAL;  Surgeon: Katha Cabal, MD;  Location: [place] [facility];  Service: Cardiovascular;  Laterality: N/A;  . excision of lymph nodes Bilateral 2014   bilateral under arms.  Marland Kitchen PARATHYROID IMPLANT REMOVAL  Social History Social History   Tobacco Use  . Smoking status: Current Every Day Smoker    Packs/day: 0.50    Years: 30.00    Pack years: 15.00    Types: Cigarettes  . Smokeless tobacco: Never Used  Vaping Use  . Vaping Use: Former  Substance Use Topics  . Alcohol use: No  . Drug use: No    Family History Family History  Problem Relation Age of Onset  . Diabetes Mother     No Known Allergies   REVIEW OF SYSTEMS (Negative unless  checked)  Constitutional: [] Weight loss  [] Fever  [] Chills Cardiac: [] Chest pain   [] Chest pressure   [] Palpitations   [] Shortness of breath when laying flat   [] Shortness of breath with exertion. Vascular:  [] Pain in legs with walking   [] Pain in legs at rest  [] History of DVT   [] Phlebitis   [] Swelling in legs   [] Varicose veins   [] Non-healing ulcers Pulmonary:   [] Uses home oxygen   [] Productive cough   [] Hemoptysis   [] Wheeze  [] COPD   [] Asthma Neurologic:  [] Dizziness   [] Seizures   [] History of stroke   [] History of TIA  [] Aphasia   [] Vissual changes   [] Weakness or numbness in arm   [] Weakness or numbness in leg Musculoskeletal:   [] Joint swelling   [] Joint pain   [] Low back pain Hematologic:  [] Easy bruising  [] Easy bleeding   [] Hypercoagulable state   [] Anemic Gastrointestinal:  [] Diarrhea   [] Vomiting  [] Gastroesophageal reflux/heartburn   [] Difficulty swallowing. Genitourinary:  [x] Chronic kidney disease   [] Difficult urination  [] Frequent urination   [] Blood in urine Skin:  [] Rashes   [] Ulcers  Psychological:  [] History of anxiety   []  History of major depression.  Physical Examination  There were no vitals filed for this visit. There is no height or weight on file to calculate BMI. Gen: WD/WN, NAD Head: [place]/AT, No temporalis wasting.  Ear/Nose/Throat: Hearing grossly intact, nares w/o erythema or drainage Eyes: PER, EOMI, sclera nonicteric.  Neck: Supple, no large masses.   Pulmonary:  Good air movement, no audible wheezing bilaterally, no use of accessory muscles.  Cardiac: RRR, no JVD Vascular: right IJ cath CD&I;  Left brachial cephalic fistula large tender aneurysms depigmentation noted +thrill and bruit Vessel Right Left  Radial Palpable Palpable  Brachial Palpable Palpable  Gastrointestinal: Non-distended. No guarding/no peritoneal signs.  Musculoskeletal: M/S 5/5 throughout.  No deformity or atrophy.  Neurologic: CN 2-12 intact. Symmetrical.  Speech is fluent. Motor  exam as listed above. Psychiatric: Judgment intact, Mood & affect appropriate for pt's clinical situation. Dermatologic: No rashes or ulcers noted.  No changes consistent with cellulitis.  CBC Lab Results  Component Value Date   WBC 4.1 03/03/2020   HGB 11.4 (L) 03/03/2020   HCT 33.9 (L) 03/03/2020   MCV 81.7 03/03/2020   PLT 223 03/03/2020    BMET    Component Value Date/Time   NA 135 03/04/2020 0713   NA 139 09/23/2014 1741   K 4.4 03/04/2020 0713   K 4.2 09/23/2014 1741   CL 95 (L) 03/04/2020 0713   CL 112 (H) 09/23/2014 1741   CO2 24 03/04/2020 0713   CO2 21 09/23/2014 1741   GLUCOSE 91 03/04/2020 0713   GLUCOSE 82 09/23/2014 1741   BUN 76 (H) 03/04/2020 0713   BUN 18 09/23/2014 1741   CREATININE 9.63 (H) 03/04/2020 0713   CREATININE 1.41 (H) 09/23/2014 1741   CALCIUM 8.0 (L) 03/04/2020 0973  CALCIUM 8.4 (L) 01/03/2019 0243   GFRNONAA 4 (L) 03/04/2020 0713   GFRNONAA 43 (L) 09/23/2014 1741   GFRNONAA 43 (L) 11/20/2012 1129   GFRAA 5 (L) 03/04/2020 0713   GFRAA 52 (L) 09/23/2014 1741   GFRAA 49 (L) 11/20/2012 1129   CrCl cannot be calculated (Patient's most recent lab result is older than the maximum 21 days allowed.).  COAG Lab Results  Component Value Date   INR 0.93 08/06/2017    Radiology PERIPHERAL VASCULAR CATHETERIZATION  Result Date: 07/05/2020 See op note  US Paracentesis  Result Date: 07/07/2020 INDICATION: Dialysis patient, abdominal distension, ascites EXAM: ULTRASOUND GUIDED THERAPEUTIC PARACENTESIS MEDICATIONS: 1% lidocaine local COMPLICATIONS: None immediate. PROCEDURE: Informed written consent was obtained from the patient after a discussion of the risks, benefits and alternatives to treatment. A timeout was performed prior to the initiation of the procedure. Initial ultrasound scanning demonstrates a large amount of ascites within the right lower abdominal quadrant. The right lower abdomen was prepped and draped in the usual sterile fashion.  1% lidocaine was used for local anesthesia. Following this, a 6 Fr Safe-T-Centesis catheter was introduced. An ultrasound image was saved for documentation purposes. The paracentesis was performed. The catheter was removed and a dressing was applied. The patient tolerated the procedure well without immediate post procedural complication. FINDINGS: A total of approximately 4 L of clear peritoneal fluid was removed. Sample was not sent for laboratory analysis IMPRESSION: Successful ultrasound-guided paracentesis yielding 4.0 liters of peritoneal fluid. Electronically Signed   By: Jerilynn Mages.  Shick M.D.   On: 07/07/2020 15:54     Assessment/Plan 1. Complication from renal dialysis device, sequela Recommend:  At this time the patient does not have appropriate extremity access for dialysis  Patient should have her left aneurysmal brachial cephalic fistula resected and then a brachial axillary graft placed.  The risks, benefits and alternative therapies were reviewed in detail with the patient.  All questions were answered.  The patient agrees to proceed with surgery.    2. ESRD on dialysis ([place]) At the present time the patient has adequate dialysis access.  Continue hemodialysis as ordered without interruption.  Avoid nephrotoxic medications and dehydration.  Further plans per nephrology  3. Diastolic dysfunction with chronic heart failure (HCC) Continue cardiac and antihypertensive medications as already ordered and reviewed, no changes at this time.  Continue statin as ordered and reviewed, no changes at this time  Nitrates PRN for chest pain   4. Chronic obstructive pulmonary disease, unspecified COPD type ([place]) Continue pulmonary medications and aerosols as already ordered, these medications have been reviewed and there are no changes at this time.      Hortencia Pilar, MD  07/20/2020 4:15 PM

## 2020-07-20 NOTE — H&P (View-Only) (Signed)
MRN : [id number]  Janet Olson is a 50 y.o. (Oct 25, 1970) female who presents with chief complaint of No chief complaint on file. Marland Kitchen  History of Present Illness:    The patient is seen for evaluation of dialysis access.  The patient has a history of multiple failed accesses.  There have been accesses in both arms and in the thighs.    Current access is via a catheter which is functioning poorly.  There have been several episodes of catheter infection.  The patient denies amaurosis fugax or recent TIA symptoms. There are no recent neurological changes noted. The patient denies claudication symptoms or rest pain symptoms. The patient denies history of DVT, PE or superficial thrombophlebitis. The patient denies recent episodes of angina or shortness of breath.    No outpatient medications have been marked as taking for the 07/21/20 encounter (Appointment) with Delana Meyer, Dolores Lory, MD.    Past Medical History:  Diagnosis Date  . (HFpEF) heart failure with preserved ejection fraction ([place])    a. 2015 Echo: Nl EF. Mild to mod LVH. Mild PR/MR; b. 04/2017 Echo: EF 55-60%, no rwma, Gr1 DD. Mildly dil LA. Small effusion; c. 12/2018 Echo: EF 55-60%, mildly dil LA/PA. Mod dil RA. Mild to mod RV dil. Mildly reduced RV fxn. Mild TR. Mod elev PASP.  Marland Kitchen Acute hypoxemic respiratory failure ([place]) 01/03/2019  . Acute renal failure (ARF) ([place]) 05/17/2017  . Acute respiratory failure ([place]) 09/25/2018  . Anemia   . Asthma   . Cardiomegaly   . COPD (chronic obstructive pulmonary disease) ([place])   . Dialysis patient ([place])    MON, WED , FRI  . Dyspnea   . ESRD (end stage renal disease) ([place])   . GERD (gastroesophageal reflux disease)   . Headache    Migraines  . Hypertension   . Nonrheumatic tricuspid (valve) insufficiency   . Respiratory failure with hypoxia ([place]) 04/21/2018  . Ulcer   . Vitamin D deficiency     Past Surgical History:  Procedure Laterality Date  . A/V FISTULAGRAM Left 04/17/2018    Procedure: A/V FISTULAGRAM;  Surgeon: Algernon Huxley, MD;  Location: [place] [facility];  Service: Cardiovascular;  Laterality: Left;  . A/V FISTULAGRAM Left 05/27/2018   Procedure: A/V FISTULAGRAM;  Surgeon: Katha Cabal, MD;  Location: [place];  Service: Cardiovascular;  Laterality: Left;  . A/V FISTULAGRAM Left 07/05/2020   Procedure: A/V FISTULAGRAM;  Surgeon: Katha Cabal, MD;  Location: [facility];  Service: Cardiovascular;  Laterality: Left;  . AV FISTULA PLACEMENT Left 08/08/2017   Procedure: ARTERIOVENOUS (AV) FISTULA CREATION ( BRACHIOCEPHALIC );  Surgeon: Algernon Huxley, MD;  Location: [facility];  Service: Vascular;  Laterality: Left;  . CESAREAN SECTION     x 4 (1988, 1991, 1997, 2000 )  . COLONOSCOPY WITH PROPOFOL N/A 04/24/2018   Procedure: COLONOSCOPY WITH PROPOFOL;  Surgeon: Lin Landsman, MD;  Location: [facility];  Service: Gastroenterology;  Laterality: N/A;  . DIALYSIS/PERMA CATHETER INSERTION N/A 07/16/2017   Procedure: Dialysis/Perma Catheter Insertion;  Surgeon: Katha Cabal, MD;  Location: [place] [facility];  Service: Cardiovascular;  Laterality: N/A;  . DIALYSIS/PERMA CATHETER REMOVAL N/A 10/29/2017   Procedure: DIALYSIS/PERMA CATHETER REMOVAL;  Surgeon: Katha Cabal, MD;  Location: [place] [facility];  Service: Cardiovascular;  Laterality: N/A;  . excision of lymph nodes Bilateral 2014   bilateral under arms.  Marland Kitchen PARATHYROID IMPLANT REMOVAL  Social History Social History   Tobacco Use  . Smoking status: Current Every Day Smoker    Packs/day: 0.50    Years: 30.00    Pack years: 15.00    Types: Cigarettes  . Smokeless tobacco: Never Used  Vaping Use  . Vaping Use: Former  Substance Use Topics  . Alcohol use: No  . Drug use: No    Family History Family History  Problem Relation Age of Onset  . Diabetes Mother     No Known Allergies   REVIEW OF SYSTEMS (Negative unless  checked)  Constitutional: [] Weight loss  [] Fever  [] Chills Cardiac: [] Chest pain   [] Chest pressure   [] Palpitations   [] Shortness of breath when laying flat   [] Shortness of breath with exertion. Vascular:  [] Pain in legs with walking   [] Pain in legs at rest  [] History of DVT   [] Phlebitis   [] Swelling in legs   [] Varicose veins   [] Non-healing ulcers Pulmonary:   [] Uses home oxygen   [] Productive cough   [] Hemoptysis   [] Wheeze  [] COPD   [] Asthma Neurologic:  [] Dizziness   [] Seizures   [] History of stroke   [] History of TIA  [] Aphasia   [] Vissual changes   [] Weakness or numbness in arm   [] Weakness or numbness in leg Musculoskeletal:   [] Joint swelling   [] Joint pain   [] Low back pain Hematologic:  [] Easy bruising  [] Easy bleeding   [] Hypercoagulable state   [] Anemic Gastrointestinal:  [] Diarrhea   [] Vomiting  [] Gastroesophageal reflux/heartburn   [] Difficulty swallowing. Genitourinary:  [x] Chronic kidney disease   [] Difficult urination  [] Frequent urination   [] Blood in urine Skin:  [] Rashes   [] Ulcers  Psychological:  [] History of anxiety   []  History of major depression.  Physical Examination  There were no vitals filed for this visit. There is no height or weight on file to calculate BMI. Gen: WD/WN, NAD Head: [place]/AT, No temporalis wasting.  Ear/Nose/Throat: Hearing grossly intact, nares w/o erythema or drainage Eyes: PER, EOMI, sclera nonicteric.  Neck: Supple, no large masses.   Pulmonary:  Good air movement, no audible wheezing bilaterally, no use of accessory muscles.  Cardiac: RRR, no JVD Vascular: right IJ cath CD&I;  Left brachial cephalic fistula large tender aneurysms depigmentation noted +thrill and bruit Vessel Right Left  Radial Palpable Palpable  Brachial Palpable Palpable  Gastrointestinal: Non-distended. No guarding/no peritoneal signs.  Musculoskeletal: M/S 5/5 throughout.  No deformity or atrophy.  Neurologic: CN 2-12 intact. Symmetrical.  Speech is fluent. Motor  exam as listed above. Psychiatric: Judgment intact, Mood & affect appropriate for pt's clinical situation. Dermatologic: No rashes or ulcers noted.  No changes consistent with cellulitis.  CBC Lab Results  Component Value Date   WBC 4.1 03/03/2020   HGB 11.4 (L) 03/03/2020   HCT 33.9 (L) 03/03/2020   MCV 81.7 03/03/2020   PLT 223 03/03/2020    BMET    Component Value Date/Time   NA 135 03/04/2020 0713   NA 139 09/23/2014 1741   K 4.4 03/04/2020 0713   K 4.2 09/23/2014 1741   CL 95 (L) 03/04/2020 0713   CL 112 (H) 09/23/2014 1741   CO2 24 03/04/2020 0713   CO2 21 09/23/2014 1741   GLUCOSE 91 03/04/2020 0713   GLUCOSE 82 09/23/2014 1741   BUN 76 (H) 03/04/2020 0713   BUN 18 09/23/2014 1741   CREATININE 9.63 (H) 03/04/2020 0713   CREATININE 1.41 (H) 09/23/2014 1741   CALCIUM 8.0 (L) 03/04/2020 9024  CALCIUM 8.4 (L) 01/03/2019 0243   GFRNONAA 4 (L) 03/04/2020 0713   GFRNONAA 43 (L) 09/23/2014 1741   GFRNONAA 43 (L) 11/20/2012 1129   GFRAA 5 (L) 03/04/2020 0713   GFRAA 52 (L) 09/23/2014 1741   GFRAA 49 (L) 11/20/2012 1129   CrCl cannot be calculated (Patient's most recent lab result is older than the maximum 21 days allowed.).  COAG Lab Results  Component Value Date   INR 0.93 08/06/2017    Radiology PERIPHERAL VASCULAR CATHETERIZATION  Result Date: 07/05/2020 See op note  US Paracentesis  Result Date: 07/07/2020 INDICATION: Dialysis patient, abdominal distension, ascites EXAM: ULTRASOUND GUIDED THERAPEUTIC PARACENTESIS MEDICATIONS: 1% lidocaine local COMPLICATIONS: None immediate. PROCEDURE: Informed written consent was obtained from the patient after a discussion of the risks, benefits and alternatives to treatment. A timeout was performed prior to the initiation of the procedure. Initial ultrasound scanning demonstrates a large amount of ascites within the right lower abdominal quadrant. The right lower abdomen was prepped and draped in the usual sterile fashion.  1% lidocaine was used for local anesthesia. Following this, a 6 Fr Safe-T-Centesis catheter was introduced. An ultrasound image was saved for documentation purposes. The paracentesis was performed. The catheter was removed and a dressing was applied. The patient tolerated the procedure well without immediate post procedural complication. FINDINGS: A total of approximately 4 L of clear peritoneal fluid was removed. Sample was not sent for laboratory analysis IMPRESSION: Successful ultrasound-guided paracentesis yielding 4.0 liters of peritoneal fluid. Electronically Signed   By: Jerilynn Mages.  Shick M.D.   On: 07/07/2020 15:54     Assessment/Plan 1. Complication from renal dialysis device, sequela Recommend:  At this time the patient does not have appropriate extremity access for dialysis  Patient should have her left aneurysmal brachial cephalic fistula resected and then a brachial axillary graft placed.  The risks, benefits and alternative therapies were reviewed in detail with the patient.  All questions were answered.  The patient agrees to proceed with surgery.    2. ESRD on dialysis ([place]) At the present time the patient has adequate dialysis access.  Continue hemodialysis as ordered without interruption.  Avoid nephrotoxic medications and dehydration.  Further plans per nephrology  3. Diastolic dysfunction with chronic heart failure (HCC) Continue cardiac and antihypertensive medications as already ordered and reviewed, no changes at this time.  Continue statin as ordered and reviewed, no changes at this time  Nitrates PRN for chest pain   4. Chronic obstructive pulmonary disease, unspecified COPD type ([place]) Continue pulmonary medications and aerosols as already ordered, these medications have been reviewed and there are no changes at this time.      Hortencia Pilar, MD  07/20/2020 4:15 PM

## 2020-07-21 ENCOUNTER — Encounter (INDEPENDENT_AMBULATORY_CARE_PROVIDER_SITE_OTHER): Payer: Self-pay | Admitting: Vascular Surgery

## 2020-07-21 ENCOUNTER — Other Ambulatory Visit: Payer: Self-pay

## 2020-07-21 ENCOUNTER — Ambulatory Visit (INDEPENDENT_AMBULATORY_CARE_PROVIDER_SITE_OTHER): Payer: Medicare Other | Admitting: Vascular Surgery

## 2020-07-21 VITALS — BP 152/80 | HR 70 | Resp 17 | Ht 66.0 in | Wt 177.0 lb

## 2020-07-21 DIAGNOSIS — N186 End stage renal disease: Secondary | ICD-10-CM | POA: Diagnosis not present

## 2020-07-21 DIAGNOSIS — Z992 Dependence on renal dialysis: Secondary | ICD-10-CM

## 2020-07-21 DIAGNOSIS — I5032 Chronic diastolic (congestive) heart failure: Secondary | ICD-10-CM

## 2020-07-21 DIAGNOSIS — T829XXS Unspecified complication of cardiac and vascular prosthetic device, implant and graft, sequela: Secondary | ICD-10-CM | POA: Diagnosis not present

## 2020-07-21 DIAGNOSIS — J449 Chronic obstructive pulmonary disease, unspecified: Secondary | ICD-10-CM

## 2020-07-21 MED ORDER — OXYCODONE HCL 5 MG PO TABS: 5.0000 mg | ORAL_TABLET | Freq: Four times a day (QID) | ORAL | 0 refills | Status: DC | PRN

## 2020-07-27 ENCOUNTER — Telehealth (INDEPENDENT_AMBULATORY_CARE_PROVIDER_SITE_OTHER): Payer: Self-pay

## 2020-07-27 NOTE — Telephone Encounter (Signed)
Spoke with the patient and she is scheduled with Dr. Delana Meyer for a Left brachial axillary graft placement and a left AV fistula removal on 08/05/20. In person pre-op on 08/02/20 at 11:00 am and covid-19 testing on 08/02/20 between 8-1 pm at the [facility]. Pre-surgical instructions will be mailed to the patient.

## 2020-08-01 ENCOUNTER — Other Ambulatory Visit (INDEPENDENT_AMBULATORY_CARE_PROVIDER_SITE_OTHER): Payer: Self-pay | Admitting: Nurse Practitioner

## 2020-08-02 ENCOUNTER — Telehealth: Payer: Self-pay | Admitting: *Deleted

## 2020-08-02 ENCOUNTER — Encounter
Admission: RE | Admit: 2020-08-02 | Discharge: 2020-08-02 | Disposition: A | Discharge status: Home / Self Care | Payer: Medicare Other | Source: Ambulatory Visit | Attending: Vascular Surgery | Admitting: Vascular Surgery

## 2020-08-02 ENCOUNTER — Other Ambulatory Visit: Payer: Self-pay

## 2020-08-02 DIAGNOSIS — Z20822 Contact with and (suspected) exposure to covid-19: Secondary | ICD-10-CM | POA: Diagnosis not present

## 2020-08-02 DIAGNOSIS — Z01812 Encounter for preprocedural laboratory examination: Secondary | ICD-10-CM | POA: Insufficient documentation

## 2020-08-02 HISTORY — DX: Unspecified osteoarthritis, unspecified site: M19.90

## 2020-08-02 HISTORY — DX: Anxiety disorder, unspecified: F41.9

## 2020-08-02 HISTORY — DX: Depression, unspecified: F32.A

## 2020-08-02 HISTORY — DX: Other ascites: R18.8

## 2020-08-02 LAB — CBC WITH DIFFERENTIAL/PLATELET
Abs Immature Granulocytes: 0.03 10*3/uL (ref 0.00–0.07)
Basophils Absolute: 0 10*3/uL (ref 0.0–0.1)
Basophils Relative: 0 %
Eosinophils Absolute: 0.2 10*3/uL (ref 0.0–0.5)
Eosinophils Relative: 3 %
HCT: 29.8 % — ABNORMAL LOW (ref 36.0–46.0)
Hemoglobin: 9.6 g/dL — ABNORMAL LOW (ref 12.0–15.0)
Immature Granulocytes: 1 %
Lymphocytes Relative: 15 %
Lymphs Abs: 0.7 10*3/uL (ref 0.7–4.0)
MCH: 29 pg (ref 26.0–34.0)
MCHC: 32.2 g/dL (ref 30.0–36.0)
MCV: 90 fL (ref 80.0–100.0)
Monocytes Absolute: 0.5 10*3/uL (ref 0.1–1.0)
Monocytes Relative: 9 %
Neutro Abs: 3.6 10*3/uL (ref 1.7–7.7)
Neutrophils Relative %: 72 %
Platelets: 216 10*3/uL (ref 150–400)
RBC: 3.31 MIL/uL — ABNORMAL LOW (ref 3.87–5.11)
RDW: 14.7 % (ref 11.5–15.5)
WBC: 4.9 10*3/uL (ref 4.0–10.5)
nRBC: 0 % (ref 0.0–0.2)

## 2020-08-02 LAB — SARS CORONAVIRUS 2 (TAT 6-24 HRS): SARS Coronavirus 2: NEGATIVE

## 2020-08-02 LAB — PROTIME-INR
INR: 1.2 (ref 0.8–1.2)
Prothrombin Time: 14.6 seconds (ref 11.4–15.2)

## 2020-08-02 LAB — TYPE AND SCREEN
ABO/RH(D): O POS
Antibody Screen: NEGATIVE

## 2020-08-02 LAB — APTT: aPTT: 31 seconds (ref 24–36)

## 2020-08-02 NOTE — Telephone Encounter (Signed)
Called the patient twice. Unable to the reach the patient, unable to leave message (as the phone just keep ringing), surgery is in 3 days, patient is not on any blood thinner. She has h/o ESRD and chronic diastolic heart failure. Last echo obtained on 02/23/2020 showed EF 50-55%, moderate LVH, sign of volume overload.  Will need to call her again tomorrow.   Per staff message: ATTENTION: Once cleared, or if further information is required, please route message back to Honor Loh, FNP-C via [facility]. No need to FAX to surgeon or PAT office. Note to be reviewed by APP in [facility]. Unable to create telephone message as per your standard workflow. Directed by [facility] providers to send requests for cardiac clearance to this pool for appropriate distribution to provider covering pre-operative clearances.

## 2020-08-02 NOTE — Patient Instructions (Signed)
Your procedure is scheduled on: Friday 8/6 Report to Day Surgery. To find out your arrival time please call [phone] between 1PM - 3PM on Thurs 8/5.  Remember: Instructions that are not followed completely may result in serious medical risk,  up to and including death, or upon the discretion of your surgeon and anesthesiologist your  surgery may need to be rescheduled.     _X__ 1. Do not eat food after midnight the night before your procedure.                 No gum chewing or hard candies. You may drink clear liquids up to 2 hours                 before you are scheduled to arrive for your surgery- DO not drink clear                 liquids within 2 hours of the start of your surgery.                 Clear Liquids include:  water, apple juice without pulp, clear Gatorade, G2 or                  Gatorade Zero (avoid Red/Purple/Blue), Black Coffee or Tea (Do not add                 anything to coffee or tea). _____2.   Complete the carbohydrate drink provided to you, 2 hours before arrival.  __X__2.  On the morning of surgery brush your teeth with toothpaste and water, you                may rinse your mouth with mouthwash if you wish.  Do not swallow any toothpaste of mouthwash.     _X__ 3.  No Alcohol for 24 hours before or after surgery.   _X__ 4.  Do Not Smoke or use e-cigarettes For 24 Hours Prior to Your Surgery.                 Do not use any chewable tobacco products for at least 6 hours prior to                 Surgery.  ___  5.  Do not use any recreational drugs (marijuana, cocaine, heroin, ecstacy, MDMA or other)                For at least one week prior to your surgery.  Combination of these drugs with anesthesia                May have life threatening results.  ____  6.  Bring all medications with you on the day of surgery if instructed.   _x___  7.  Notify your doctor if there is any change in your medical condition      (cold, fever,  infections).     Do not wear jewelry, make-up, hairpins, clips or nail polish. Do not wear lotions, powders, or perfumes.  Do not shave 48 hours prior to surgery.  Do not bring valuables to the hospital.    [facility] is not responsible for any belongings or valuables.  Contacts, dentures or bridgework may not be worn into surgery. Leave your suitcase in the car. After surgery it may be brought to your room. For patients admitted to the hospital, discharge time is determined by your treatment team.   Patients discharged the day of  surgery will not be allowed to drive home.   Make arrangements for someone to be with you for the first 24 hours of your Same Day Discharge.    Please read over the following fact sheets that you were given:    __x__ Take these medicines the morning of surgery with A SIP OF WATER:    1. amLODipine (NORVASC) 10 MG tablet  2. DULoxetine (CYMBALTA) 60 MG capsule  3. metoprolol tartrate (LOPRESSOR) 50 MG tablet  4.pantoprazole (PROTONIX) 40 MG tablet take the night before and the morning of surgery  5.  6.  ____ Fleet Enema (as directed)   _x___ Use CHG Soap (or wipes) as directed  ____ Use Benzoyl Peroxide Gel as instructed  __x__ Use inhalers on the day of surgeryalbuterol (VENTOLIN HFA) 108 (90 Base) MCG/ACT inhaler and bring it with youDULERA 200-5 MCG/ACT AERO  ____ Stop metformin 2 days prior to surgery    ____ Take 1/2 of usual insulin dose the night before surgery. No insulin the morning          of surgery.   ____ Stop Coumadin/Plavix/aspirin on   ____ Stop Anti-inflammatories on    ____ Stop supplements until after surgery.    ____ Bring C-Pap to the hospital.

## 2020-08-02 NOTE — Telephone Encounter (Signed)
   [facility] Pre-operative Risk Assessment    [facility]ARE STAFF: - Please ensure there is not already an duplicate clearance open for this procedure. - Under Visit Info/Reason for Call, type in Other and utilize the format Clearance MM/DD/YY or Clearance TBD. Do not use dashes or single digits. - If request is for dental extraction, please clarify the # of teeth to be extracted.  Request for surgical clearance:  1. What type of surgery is being performed? Insertion of AV GORE-TEX graft (brachial axillary)   2. When is this surgery scheduled? 08/05/2020   3. What type of clearance is required (medical clearance vs. Pharmacy clearance to hold med vs. Both)? medical  4. Are there any medications that need to be held prior to surgery and how long? None   5. Practice name and name of physician performing surgery? Dr Hortencia Pilar   6. What is the office phone number? [phone]   7.   What is the office fax number? [phone]  8.   Anesthesia type (None, local, MAC, general) ? General   Wilburta Milbourn L 08/02/2020, 3:12 PM  _________________________________________________________________   (provider comments below)

## 2020-08-03 ENCOUNTER — Telehealth: Payer: Self-pay | Admitting: Cardiovascular Disease

## 2020-08-03 NOTE — Telephone Encounter (Signed)
Can you call Dr. Waldron Labs 's office to see if they have another number for pt, she has not been answering phone.  Thank you

## 2020-08-03 NOTE — Telephone Encounter (Signed)
Spoke with patient, stated she was returning a call but the patient was directed to [facility], patient is actually seen at the [place] location

## 2020-08-03 NOTE — Telephone Encounter (Signed)
Janet Kicks, NP talked with mother and she was to ask daughter to call. Awaiting on pt to call back.

## 2020-08-03 NOTE — Telephone Encounter (Signed)
Pt had just talked with surgical office so I called her cell phone, it rang then no answer.  Will try later

## 2020-08-04 NOTE — Telephone Encounter (Signed)
Patient returning call.

## 2020-08-04 NOTE — Progress Notes (Signed)
[facility] Perioperative Services  Pre-Admission/Anesthesia Testing Clinical Review  Date: 08/04/20  Patient Demographics:  Name: Janet Olson DOB:   [birth year] MRN:   [id number]  Planned Surgical Procedure(s):    Case: [id number] Date/Time: 08/05/20 1055   Procedures:      INSERTION OF ARTERIOVENOUS (AV) GORE-TEX GRAFT ARM(BRACHIAL AXILLARY) (Left )     REMOVAL OF A DIALYSIS CATHETER ( FISTULA ) (Left )   Anesthesia type: General   Pre-op diagnosis: ESRD   Location: [facility] OR ROOM 08 / [place] [facility]S FOR ANESTHESIA GROUP   Surgeons: Katha Cabal, MD     NOTE: Available PAT nursing documentation and vital signs have been reviewed. Clinical nursing staff has updated patient's PMH/PSHx, current medication list, and drug allergies/intolerances to ensure comprehensive history available to assist in medical decision making as it pertains to the aforementioned surgical procedure and anticipated anesthetic course.   Clinical Discussion:  Janet Olson is a 50 y.o. female who is submitted for pre-surgical anesthesia review and clearance prior to her undergoing the above procedure. Patient is a Current Smoker (15 pack years). Pertinent PMH includes: cardiomegaly, HTN, non-rheumatic TV insufficiency, diastolic HFpEF, anemia of chronic disease, COPD, asthma, dyspnea, dysphonia (vocal cord polyps), ESRD (on HD on M,W,F), ascites, PUD (takes daily PPI), OA, anxiety, depression  Patient with multiple hospital admissions this year secondary to volume overload. Notes from each admission reviewed, each indicating that patient had missed several hemodialysis treatments prior to each admission.  Patient is status post multiple paracenteses with removal of 2 L in 01/2020, 7.5 L in 02/2020, 4.1 L in 04/2020, and 4 L in 06/2020.  Patient is followed by cardiology Rockey Situ, MD). She was last seen in the cardiology clinic on 03/08/2020; notes reviewed. Patient with high oral fluid  intake; takes torsemide. Patient with chronic shortness of breath and fatigue. ECG showed NSR at rate of 87 with no significant ST segment or T wave changes. TTE from 02/2020 revealed ascites and a pericardial effusion posterior to the left ventricle. LVEF was estimated at 50-55% (see full interpretation below).  Despite her shortness of breath related to her COPD, patient continues to smoke daily; cessation encouraged. Patient also has varying degrees of shortness of breath related to volume overload and resultant ascites. Cardiologist stressed importance of compliance with hemodialysis appointments in efforts to reduce shortness of breath and cardiac strain. This patient is not on daily anticoagulation therapy. Given her PMH, pre-surgical/anesthesia cardiac clearance was requested by the PAT team. Multiple attempts by several offices made to patient and her mother in efforts to speak with her regarding requested clearance. As of 1700 on 08/04/2020 cardiology has not been able to speak with the patient.   She denies previous intra-operative complications with anesthesia. She underwent a general anesthetic course here (ASA III) in 11/2018 at [facility] with no documented complications. Of note, patient was found to have a UDS (+) for cocaine during 11/2018 admission at [facility].   Vitals with BMI 08/02/2020 07/21/2020 07/07/2020  Height 5\' 6"  5\' 6"  -  Weight 168 lbs 177 lbs -  BMI 67.89 38.10 -  Systolic 175 102 585  Diastolic 80 80 80  Pulse 69 70 86    Providers/Specialists:   NOTE: Primary physician provider listed below. Patient may have been seen by APP or partner within same practice.   PROVIDER ROLE LAST Rachel Moulds, MD Vascular Surgery 07/21/2020  Remi Haggard, FNP Primary Care Provider ?  Ida Rogue, MD  Cardiology 03/08/2020   Allergies:  Patient has no known allergies.  Current Home Medications:   . albuterol (PROVENTIL) (2.5 MG/3ML) 0.083% nebulizer solution  . albuterol  (VENTOLIN HFA) 108 (90 Base) MCG/ACT inhaler  . amLODipine (NORVASC) 10 MG tablet  . benzonatate (TESSALON) 100 MG capsule  . calcium acetate (PHOSLO) 667 MG capsule  . chlorpheniramine-HYDROcodone (TUSSIONEX) 10-8 MG/5ML SUER  . docusate sodium (COLACE) 100 MG capsule  . DULERA 200-5 MCG/ACT AERO  . DULoxetine (CYMBALTA) 60 MG capsule  . hydrOXYzine (ATARAX/VISTARIL) 25 MG tablet  . Melatonin 10 MG CAPS  . metoprolol tartrate (LOPRESSOR) 50 MG tablet  . oxyCODONE (OXY IR/ROXICODONE) 5 MG immediate release tablet  . pantoprazole (PROTONIX) 40 MG tablet  . QUEtiapine (SEROQUEL) 25 MG tablet  . torsemide (DEMADEX) 100 MG tablet  . multivitamin (RENA-VIT) TABS tablet  . Vitamin D, Ergocalciferol, (DRISDOL) 50000 units CAPS capsule   No current facility-administered medications for this encounter.   History:   Past Medical History:  Diagnosis Date  . (HFpEF) heart failure with preserved ejection fraction ([place])    a. 2015 Echo: Nl EF. Mild to mod LVH. Mild PR/MR; b. 04/2017 Echo: EF 55-60%, no rwma, Gr1 DD. Mildly dil LA. Small effusion; c. 12/2018 Echo: EF 55-60%, mildly dil LA/PA. Mod dil RA. Mild to mod RV dil. Mildly reduced RV fxn. Mild TR. Mod elev PASP.  Marland Kitchen Abdominal ascites   . Acute hypoxemic respiratory failure ([place]) 01/03/2019  . Acute renal failure (ARF) ([place]) 05/17/2017  . Acute respiratory failure ([place]) 09/25/2018  . Anemia   . Anxiety   . Arthritis   . Asthma   . Cardiomegaly   . COPD (chronic obstructive pulmonary disease) ([place])   . Depression   . Dialysis patient ([place])    MON, WED , FRI  . Dyspnea   . ESRD (end stage renal disease) ([place])   . GERD (gastroesophageal reflux disease)   . Headache    Migraines  . Hypertension   . Nonrheumatic tricuspid (valve) insufficiency   . Respiratory failure with hypoxia ([place]) 04/21/2018  . Ulcer    gastric  . Vitamin D deficiency    Past Surgical History:  Procedure Laterality Date  . A/V FISTULAGRAM Left 04/17/2018    Procedure: A/V FISTULAGRAM;  Surgeon: Algernon Huxley, MD;  Location: [place] [facility];  Service: Cardiovascular;  Laterality: Left;  . A/V FISTULAGRAM Left 05/27/2018   Procedure: A/V FISTULAGRAM;  Surgeon: Katha Cabal, MD;  Location: [place] [facility];  Service: Cardiovascular;  Laterality: Left;  . A/V FISTULAGRAM Left 07/05/2020   Procedure: A/V FISTULAGRAM;  Surgeon: Katha Cabal, MD;  Location: [place] [facility];  Service: Cardiovascular;  Laterality: Left;  . AV FISTULA PLACEMENT Left 08/08/2017   Procedure: ARTERIOVENOUS (AV) FISTULA CREATION ( BRACHIOCEPHALIC );  Surgeon: Algernon Huxley, MD;  Location: [facility] ORS;  Service: Vascular;  Laterality: Left;  . CESAREAN SECTION     x 4 (1988, 1991, 1997, 2000 )  . COLONOSCOPY WITH PROPOFOL N/A 04/24/2018   Procedure: COLONOSCOPY WITH PROPOFOL;  Surgeon: Lin Landsman, MD;  Location: [facility] ENDOSCOPY;  Service: Gastroenterology;  Laterality: N/A;  . DIALYSIS/PERMA CATHETER INSERTION N/A 07/16/2017   Procedure: Dialysis/Perma Catheter Insertion;  Surgeon: Katha Cabal, MD;  Location: [place] [facility];  Service: Cardiovascular;  Laterality: N/A;  . DIALYSIS/PERMA CATHETER REMOVAL N/A 10/29/2017   Procedure: DIALYSIS/PERMA CATHETER REMOVAL;  Surgeon: Katha Cabal, MD;  Location: [place] [facility];  Service: Cardiovascular;  Laterality: N/A;  . excision of lymph nodes Bilateral 2014   bilateral under arms.  Marland Kitchen PARATHYROID IMPLANT REMOVAL     Family History  Problem Relation Age of Onset  . Diabetes Mother    Social History   Tobacco Use  . Smoking status: Current Every Day Smoker    Packs/day: 0.50    Years: 30.00    Pack years: 15.00    Types: Cigarettes  . Smokeless tobacco: Never Used  Vaping Use  . Vaping Use: Never used  Substance Use Topics  . Alcohol use: No  . Drug use: No    Pertinent Clinical Results:  LABS: Labs reviewed: Acceptable for surgery.  Hospital Outpatient Visit on 08/02/2020   Component Date Value Ref Range Status  . SARS Coronavirus 2 08/02/2020 NEGATIVE  NEGATIVE Final   Comment: (NOTE) SARS-CoV-2 target nucleic acids are NOT DETECTED.  The SARS-CoV-2 RNA is generally detectable in upper and lower respiratory specimens during the acute phase of infection. Negative results do not preclude SARS-CoV-2 infection, do not rule out co-infections with other pathogens, and should not be used as the sole basis for treatment or other patient management decisions. Negative results must be combined with clinical observations, patient history, and epidemiological information. The expected result is Negative.  Fact Sheet for Patients: SugarRoll.be  Fact Sheet for Healthcare Providers: https://www.woods-mathews.com/  This test is not yet approved or cleared by the [place] FDA and  has been authorized for detection and/or diagnosis of SARS-CoV-2 by FDA under an Emergency Use Authorization (EUA). This EUA will remain  in effect (meaning this test can be used) for the duration of the COVID-19 declaration under Se                          ction 564(b)(1) of the Act, 21 U.S.C. section 360bbb-3(b)(1), unless the authorization is terminated or revoked sooner.  Performed at [facility], [place] [address], [place], [place] [address]   . aPTT 08/02/2020 31  24 - 36 seconds Final   Performed at [facility], [address] [place], [place] [address]  . WBC 08/02/2020 4.9  4.0 - 10.5 K/uL Final  . RBC 08/02/2020 3.31* 3.87 - 5.11 MIL/uL Final  . Hemoglobin 08/02/2020 9.6* 12.0 - 15.0 g/dL Final  . HCT 08/02/2020 29.8* 36 - 46 % Final  . MCV 08/02/2020 90.0  80.0 - 100.0 fL Final  . MCH 08/02/2020 29.0  26.0 - 34.0 pg Final  . MCHC 08/02/2020 32.2  30.0 - 36.0 g/dL Final  . RDW 08/02/2020 14.7  11.5 - 15.5 % Final  . Platelets 08/02/2020 216  150 - 400 K/uL Final  . nRBC 08/02/2020 0.0  0.0 - 0.2 % Final  .  Neutrophils Relative % 08/02/2020 72  % Final  . Neutro Abs 08/02/2020 3.6  1.7 - 7.7 K/uL Final  . Lymphocytes Relative 08/02/2020 15  % Final  . Lymphs Abs 08/02/2020 0.7  0.7 - 4.0 K/uL Final  . Monocytes Relative 08/02/2020 9  % Final  . Monocytes Absolute 08/02/2020 0.5  0 - 1 K/uL Final  . Eosinophils Relative 08/02/2020 3  % Final  . Eosinophils Absolute 08/02/2020 0.2  0 - 0 K/uL Final  . Basophils Relative 08/02/2020 0  % Final  . Basophils Absolute 08/02/2020 0.0  0 - 0 K/uL Final  . Immature Granulocytes 08/02/2020 1  % Final  . Abs Immature Granulocytes  08/02/2020 0.03  0.00 - 0.07 K/uL Final   Performed at [facility] [address] [place], [place] [address]  . Prothrombin Time 08/02/2020 14.6  11.4 - 15.2 seconds Final  . INR 08/02/2020 1.2  0.8 - 1.2 Final   Comment: (NOTE) INR goal varies based on device and disease states. Performed at [facility] [address] [place], [place] [address]   . ABO/RH(D) 08/02/2020 O POS   Final  . Antibody Screen 08/02/2020 NEG   Final  . Sample Expiration 08/02/2020 08/16/2020,2359   Final  . Extend sample reason 08/02/2020    Final                   Value:NO TRANSFUSIONS OR PREGNANCY IN THE PAST 3 MONTHS Performed at [facility], [address] [place], [place] [address]     ECG: Date: 03/08/2020 Time ECG obtained: 1642 PM Rate: 82 bpm Rhythm:  SR with 1st degree AV block Axis (leads I and aVF): Normal Intervals: PR 214 ms. QTc 500 ms. ST segment and T wave changes: No evidence of acute ST segment elevation or depression Comparison: Similar to previous tracing obtained on 03/03/2020.   IMAGING / PROCEDURES: ECHOCARDIOGRAM done on 02/23/2020 1. Left ventricular ejection fraction, by estimation, is 50 to 55%. The left ventricle has low normal function. The left ventricle has no regional wall motion abnormalities. There is moderate left ventricular hypertrophy. Left ventricular diastolic  parameters are indeterminate. There is the interventricular septum is flattened in diastole ('D' shaped left ventricle), consistent with right ventricular volume overload.  2. Right ventricular systolic function is moderately reduced. The right ventricular size is mildly enlarged. There is moderately elevated pulmonary artery systolic pressure.  3. Left atrial size was mildly dilated.  4. Right atrial size was moderately dilated. 5. Acites is present.. The pericardial effusion is posterior to the left ventricle.  6. The mitral valve is myxomatous. Trivial mitral valve regurgitation.  7. The aortic valve is tricuspid. Aortic valve regurgitation is not visualized. No aortic stenosis is present.  8. Moderately dilated pulmonary artery.  9. The inferior vena cava is dilated in size with <50% respiratory variability, suggesting right atrial pressure of 15 mmHg.   DIAGNOSTIC CHEST RADIOGRAPHS done on 03/03/2020 1. Slight interval increase in the cardiomegaly and findings suggestive of interstitial edema.  Impression and Plan:  Janet Olson has been referred for pre-anesthesia review and clearance prior her undergoing the planned anesthetic and procedural courses. Available labs, pertinent testing, and imaging results were personally reviewed by me. This patient is PENDING cardiac clearance at this point. Cardiology will attempt to contact patient again in morning. If unable to contact the patient, procedure will need to be rescheduled unless the attending anesthesiologist feels as if patient is at an acceptable risk to forego the clearance and proceed. I have spoken with the [facility] team to make them aware of the plans as they stand at this point.   With that being said, based on clinical review performed today (08/04/20), barring and significant acute changes in the patient's overall condition, and assuming that appropriate clearance by cardiology is obtained, it is anticipated that she will be able to  proceed with the planned surgical intervention. Any acute changes in clinical condition may necessitate her procedure being postponed and/or cancelled. Pre-surgical instructions were reviewed with the patient during her PAT appointment and questions were fielded by PAT clinical staff.  Honor Loh, MSN, APRN, FNP-C, [facility] Nurse Practitioner Phone: [phone] 08/04/20 5:18 PM   NOTE: This note has been prepared using Dragon dictation software. Despite my best ability to proofread, there is always the potential that unintentional transcriptional errors may still occur from this process.

## 2020-08-04 NOTE — Telephone Encounter (Signed)
Patient is returning [facility] call. Please call.

## 2020-08-05 ENCOUNTER — Ambulatory Visit: Payer: Medicare Other | Admitting: Urgent Care

## 2020-08-05 ENCOUNTER — Ambulatory Visit
Admission: RE | Admit: 2020-08-05 | Discharge: 2020-08-05 | Disposition: A | Discharge status: Home / Self Care | Payer: Medicare Other | Attending: Vascular Surgery | Admitting: Vascular Surgery

## 2020-08-05 ENCOUNTER — Encounter
Admission: RE | Disposition: A | Discharge status: Home / Self Care | Payer: Self-pay | Source: Home / Self Care | Attending: Vascular Surgery

## 2020-08-05 ENCOUNTER — Encounter: Payer: Self-pay | Admitting: Vascular Surgery

## 2020-08-05 ENCOUNTER — Other Ambulatory Visit: Payer: Self-pay

## 2020-08-05 DIAGNOSIS — F17210 Nicotine dependence, cigarettes, uncomplicated: Secondary | ICD-10-CM | POA: Diagnosis not present

## 2020-08-05 DIAGNOSIS — N186 End stage renal disease: Secondary | ICD-10-CM | POA: Insufficient documentation

## 2020-08-05 DIAGNOSIS — T82511A Breakdown (mechanical) of surgically created arteriovenous shunt, initial encounter: Secondary | ICD-10-CM | POA: Insufficient documentation

## 2020-08-05 DIAGNOSIS — I132 Hypertensive heart and chronic kidney disease with heart failure and with stage 5 chronic kidney disease, or end stage renal disease: Secondary | ICD-10-CM | POA: Diagnosis not present

## 2020-08-05 DIAGNOSIS — T82898A Other specified complication of vascular prosthetic devices, implants and grafts, initial encounter: Secondary | ICD-10-CM | POA: Diagnosis not present

## 2020-08-05 DIAGNOSIS — J449 Chronic obstructive pulmonary disease, unspecified: Secondary | ICD-10-CM | POA: Insufficient documentation

## 2020-08-05 DIAGNOSIS — I5032 Chronic diastolic (congestive) heart failure: Secondary | ICD-10-CM | POA: Diagnosis not present

## 2020-08-05 DIAGNOSIS — Z992 Dependence on renal dialysis: Secondary | ICD-10-CM

## 2020-08-05 DIAGNOSIS — Y832 Surgical operation with anastomosis, bypass or graft as the cause of abnormal reaction of the patient, or of later complication, without mention of misadventure at the time of the procedure: Secondary | ICD-10-CM | POA: Insufficient documentation

## 2020-08-05 HISTORY — PX: AV FISTULA PLACEMENT: SHX1204

## 2020-08-05 HISTORY — PX: REMOVAL OF A DIALYSIS CATHETER: SHX6053

## 2020-08-05 LAB — BASIC METABOLIC PANEL
Anion gap: 16 — ABNORMAL HIGH (ref 5–15)
BUN: 106 mg/dL — ABNORMAL HIGH (ref 6–20)
CO2: 18 mmol/L — ABNORMAL LOW (ref 22–32)
Calcium: 8.7 mg/dL — ABNORMAL LOW (ref 8.9–10.3)
Chloride: 100 mmol/L (ref 98–111)
Creatinine, Ser: 13.23 mg/dL — ABNORMAL HIGH (ref 0.44–1.00)
GFR calc Af Amer: 3 mL/min — ABNORMAL LOW (ref >60)
GFR calc non Af Amer: 3 mL/min — ABNORMAL LOW (ref >60)
Glucose, Bld: 99 mg/dL (ref 70–99)
Potassium: 5.5 mmol/L — ABNORMAL HIGH (ref 3.5–5.1)
Sodium: 134 mmol/L — ABNORMAL LOW (ref 135–145)

## 2020-08-05 SURGERY — INSERTION OF ARTERIOVENOUS (AV) GORE-TEX GRAFT ARM
Anesthesia: General | Site: Arm Upper | Laterality: Left

## 2020-08-05 MED ORDER — GLYCOPYRROLATE 0.2 MG/ML IJ SOLN
INTRAMUSCULAR | Status: AC
  Filled 2020-08-05: qty 1

## 2020-08-05 MED ORDER — ORAL CARE MOUTH RINSE: 15.0000 mL | Freq: Once | OROMUCOSAL | Status: AC

## 2020-08-05 MED ORDER — ONDANSETRON HCL 4 MG/2ML IJ SOLN
INTRAMUSCULAR | Status: DC | PRN
  Administered 2020-08-05: 4 mg via INTRAVENOUS

## 2020-08-05 MED ORDER — DEXAMETHASONE SODIUM PHOSPHATE 10 MG/ML IJ SOLN
INTRAMUSCULAR | Status: AC
  Filled 2020-08-05: qty 1

## 2020-08-05 MED ORDER — CHLORHEXIDINE GLUCONATE CLOTH 2 % EX PADS: 6.0000 | MEDICATED_PAD | Freq: Once | CUTANEOUS | Status: DC

## 2020-08-05 MED ORDER — ACETAMINOPHEN 10 MG/ML IV SOLN
INTRAVENOUS | Status: DC | PRN
  Administered 2020-08-05: 1000 mg via INTRAVENOUS

## 2020-08-05 MED ORDER — BUPIVACAINE LIPOSOME 1.3 % IJ SUSP
INTRAMUSCULAR | Status: AC
  Filled 2020-08-05: qty 20

## 2020-08-05 MED ORDER — VASOPRESSIN 20 UNIT/ML IV SOLN
INTRAVENOUS | Status: DC | PRN
Start: 2020-08-05 — End: 2020-08-05
  Administered 2020-08-05 (×3): 1 [IU] via INTRAVENOUS

## 2020-08-05 MED ORDER — BUPIVACAINE LIPOSOME 1.3 % IJ SUSP
INTRAMUSCULAR | Status: DC | PRN
  Administered 2020-08-05: 43 mL

## 2020-08-05 MED ORDER — EPHEDRINE 5 MG/ML INJ
INTRAVENOUS | Status: AC
  Filled 2020-08-05: qty 10

## 2020-08-05 MED ORDER — CEFAZOLIN SODIUM-DEXTROSE 1-4 GM/50ML-% IV SOLN
1.0000 g | INTRAVENOUS | Status: AC
  Administered 2020-08-05: 1 g via INTRAVENOUS

## 2020-08-05 MED ORDER — BUPIVACAINE HCL (PF) 0.5 % IJ SOLN
INTRAMUSCULAR | Status: AC
  Filled 2020-08-05: qty 30

## 2020-08-05 MED ORDER — LIDOCAINE HCL (CARDIAC) PF 100 MG/5ML IV SOSY
PREFILLED_SYRINGE | INTRAVENOUS | Status: DC | PRN
  Administered 2020-08-05: 60 mg via INTRAVENOUS

## 2020-08-05 MED ORDER — ACETAMINOPHEN 10 MG/ML IV SOLN
INTRAVENOUS | Status: AC
  Filled 2020-08-05: qty 100

## 2020-08-05 MED ORDER — GLYCOPYRROLATE 0.2 MG/ML IJ SOLN
INTRAMUSCULAR | Status: DC | PRN
  Administered 2020-08-05: .2 mg via INTRAVENOUS

## 2020-08-05 MED ORDER — PROPOFOL 10 MG/ML IV BOLUS
INTRAVENOUS | Status: AC
  Filled 2020-08-05: qty 20

## 2020-08-05 MED ORDER — OXYCODONE-ACETAMINOPHEN 5-325 MG PO TABS: 1.0000 | ORAL_TABLET | Freq: Four times a day (QID) | ORAL | 0 refills | Status: AC | PRN

## 2020-08-05 MED ORDER — "VISTASEAL 4 ML SINGLE DOSE KIT "
PACK | CUTANEOUS | Status: DC | PRN
  Administered 2020-08-05: 4 mL via TOPICAL

## 2020-08-05 MED ORDER — KETAMINE HCL 10 MG/ML IJ SOLN
INTRAMUSCULAR | Status: DC | PRN
Start: 2020-08-05 — End: 2020-08-05
  Administered 2020-08-05: 50 mg via INTRAVENOUS
  Administered 2020-08-05: 20 mg via INTRAVENOUS
  Administered 2020-08-05: 30 mg via INTRAVENOUS

## 2020-08-05 MED ORDER — PHENYLEPHRINE HCL (PRESSORS) 10 MG/ML IV SOLN
INTRAVENOUS | Status: DC | PRN
  Administered 2020-08-05: 100 ug via INTRAVENOUS
  Administered 2020-08-05 (×2): 200 ug via INTRAVENOUS

## 2020-08-05 MED ORDER — SODIUM CHLORIDE 0.9 % IV SOLN
INTRAVENOUS | Status: DC | PRN
  Administered 2020-08-05: 500 mL via INTRAMUSCULAR

## 2020-08-05 MED ORDER — EPHEDRINE SULFATE 50 MG/ML IJ SOLN
INTRAMUSCULAR | Status: DC | PRN
  Administered 2020-08-05 (×5): 10 mg via INTRAVENOUS

## 2020-08-05 MED ORDER — MIDAZOLAM HCL 2 MG/2ML IJ SOLN
INTRAMUSCULAR | Status: DC | PRN
  Administered 2020-08-05: 1 mg via INTRAVENOUS

## 2020-08-05 MED ORDER — SODIUM CHLORIDE 0.9 % IV SOLN: INTRAVENOUS | Status: DC

## 2020-08-05 MED ORDER — CEFAZOLIN SODIUM-DEXTROSE 1-4 GM/50ML-% IV SOLN
INTRAVENOUS | Status: AC
  Filled 2020-08-05: qty 50

## 2020-08-05 MED ORDER — DEXAMETHASONE SODIUM PHOSPHATE 10 MG/ML IJ SOLN
INTRAMUSCULAR | Status: DC | PRN
  Administered 2020-08-05: 6 mg via INTRAVENOUS

## 2020-08-05 MED ORDER — MIDAZOLAM HCL 2 MG/2ML IJ SOLN
INTRAMUSCULAR | Status: AC
  Filled 2020-08-05: qty 2

## 2020-08-05 MED ORDER — HEPARIN SODIUM (PORCINE) 5000 UNIT/ML IJ SOLN
INTRAMUSCULAR | Status: AC
  Filled 2020-08-05: qty 1

## 2020-08-05 MED ORDER — FENTANYL CITRATE (PF) 100 MCG/2ML IJ SOLN
INTRAMUSCULAR | Status: DC | PRN
  Administered 2020-08-05 (×2): 25 ug via INTRAVENOUS
  Administered 2020-08-05: 50 ug via INTRAVENOUS

## 2020-08-05 MED ORDER — FENTANYL CITRATE (PF) 100 MCG/2ML IJ SOLN
INTRAMUSCULAR | Status: AC
  Filled 2020-08-05: qty 2

## 2020-08-05 MED ORDER — VASOPRESSIN 20 UNIT/ML IV SOLN
INTRAVENOUS | Status: AC
  Filled 2020-08-05: qty 1

## 2020-08-05 MED ORDER — BACITRACIN ZINC 500 UNIT/GM EX OINT
TOPICAL_OINTMENT | CUTANEOUS | Status: AC
  Filled 2020-08-05: qty 28.35

## 2020-08-05 MED ORDER — LIDOCAINE HCL (PF) 2 % IJ SOLN
INTRAMUSCULAR | Status: AC
  Filled 2020-08-05: qty 5

## 2020-08-05 MED ORDER — FENTANYL CITRATE (PF) 100 MCG/2ML IJ SOLN: 25.0000 ug | INTRAMUSCULAR | Status: DC | PRN

## 2020-08-05 MED ORDER — HEMOSTATIC AGENTS (NO CHARGE) OPTIME
TOPICAL | Status: DC | PRN
  Administered 2020-08-05: 1 via TOPICAL

## 2020-08-05 MED ORDER — DEXMEDETOMIDINE HCL 200 MCG/2ML IV SOLN
INTRAVENOUS | Status: DC | PRN
  Administered 2020-08-05: 8 ug via INTRAVENOUS

## 2020-08-05 MED ORDER — PROPOFOL 10 MG/ML IV BOLUS
INTRAVENOUS | Status: DC | PRN
  Administered 2020-08-05: 120 mg via INTRAVENOUS
  Administered 2020-08-05: 40 mg via INTRAVENOUS
  Administered 2020-08-05: 70 mg via INTRAVENOUS
  Administered 2020-08-05: 50 mg via INTRAVENOUS

## 2020-08-05 MED ORDER — ONDANSETRON HCL 4 MG/2ML IJ SOLN: 4.0000 mg | Freq: Once | INTRAMUSCULAR | Status: DC | PRN

## 2020-08-05 MED ORDER — ROCURONIUM BROMIDE 10 MG/ML (PF) SYRINGE
PREFILLED_SYRINGE | INTRAVENOUS | Status: AC
  Filled 2020-08-05: qty 10

## 2020-08-05 MED ORDER — CHLORHEXIDINE GLUCONATE 0.12 % MT SOLN: 15.0000 mL | Freq: Once | OROMUCOSAL | Status: AC

## 2020-08-05 MED ORDER — CHLORHEXIDINE GLUCONATE 0.12 % MT SOLN
OROMUCOSAL | Status: AC
  Administered 2020-08-05: 15 mL via OROMUCOSAL
  Filled 2020-08-05: qty 15

## 2020-08-05 MED ORDER — ONDANSETRON HCL 4 MG/2ML IJ SOLN
INTRAMUSCULAR | Status: AC
  Filled 2020-08-05: qty 2

## 2020-08-05 SURGICAL SUPPLY — 77 items
ADH SKN CLS APL DERMABOND .7 (GAUZE/BANDAGES/DRESSINGS) ×2
APL PRP STRL LF DISP 70% ISPRP (MISCELLANEOUS) ×2
APPLIER CLIP 11 MED OPEN (CLIP)
APPLIER CLIP 9.375 SM OPEN (CLIP)
APR CLP MED 11 20 MLT OPN (CLIP)
APR CLP SM 9.3 20 MLT OPN (CLIP)
BAG COUNTER SPONGE EZ (MISCELLANEOUS) ×3 IMPLANT
BAG DECANTER FOR FLEXI CONT (MISCELLANEOUS) ×4 IMPLANT
BAG SPNG 4X4 CLR HAZ (MISCELLANEOUS) ×2
BLADE SURG 15 STRL LF DISP TIS (BLADE) ×2 IMPLANT
BLADE SURG 15 STRL SS (BLADE) ×4
BLADE SURG SZ11 CARB STEEL (BLADE) ×4 IMPLANT
BOOT SUTURE AID YELLOW STND (SUTURE) ×4 IMPLANT
BRUSH SCRUB EZ  4% CHG (MISCELLANEOUS) ×2
BRUSH SCRUB EZ 4% CHG (MISCELLANEOUS) ×2 IMPLANT
CANISTER SUCT 1200ML W/VALVE (MISCELLANEOUS) ×4 IMPLANT
CHLORAPREP W/TINT 26 (MISCELLANEOUS) ×4 IMPLANT
CLIP APPLIE 11 MED OPEN (CLIP) IMPLANT
CLIP APPLIE 9.375 SM OPEN (CLIP) IMPLANT
COUNTER SPONGE BAG EZ (MISCELLANEOUS) ×1
COVER WAND RF STERILE (DRAPES) ×4 IMPLANT
DERMABOND ADVANCED (GAUZE/BANDAGES/DRESSINGS) ×2
DERMABOND ADVANCED .7 DNX12 (GAUZE/BANDAGES/DRESSINGS) ×2 IMPLANT
DRAPE LAPAROTOMY 100X77 ABD (DRAPES) IMPLANT
DRESSING SURGICEL FIBRLLR 1X2 (HEMOSTASIS) ×2 IMPLANT
DRSG SURGICEL FIBRILLAR 1X2 (HEMOSTASIS) ×4
ELECT CAUTERY BLADE 6.4 (BLADE) ×4 IMPLANT
ELECT REM PT RETURN 9FT ADLT (ELECTROSURGICAL) ×4
ELECTRODE REM PT RTRN 9FT ADLT (ELECTROSURGICAL) ×2 IMPLANT
GAUZE 4X4 16PLY RFD (DISPOSABLE) ×4 IMPLANT
GLOVE BIO SURGEON STRL SZ7 (GLOVE) ×12 IMPLANT
GLOVE INDICATOR 7.5 STRL GRN (GLOVE) ×12 IMPLANT
GLOVE SURG SYN 8.0 (GLOVE) ×4 IMPLANT
GOWN STRL REUS W/ TWL LRG LVL3 (GOWN DISPOSABLE) ×4 IMPLANT
GOWN STRL REUS W/ TWL XL LVL3 (GOWN DISPOSABLE) ×2 IMPLANT
GOWN STRL REUS W/TWL LRG LVL3 (GOWN DISPOSABLE) ×8
GOWN STRL REUS W/TWL XL LVL3 (GOWN DISPOSABLE) ×4
GRAFT COLLAGEN VASCULAR 8X40 (Vascular Products) ×4 IMPLANT
IV NS 500ML (IV SOLUTION) ×4
IV NS 500ML BAXH (IV SOLUTION) ×2 IMPLANT
KIT TURNOVER KIT A (KITS) ×4 IMPLANT
LABEL OR SOLS (LABEL) ×4 IMPLANT
LOOP RED MAXI  1X406MM (MISCELLANEOUS) ×2
LOOP VESSEL MAXI 1X406 RED (MISCELLANEOUS) ×2 IMPLANT
LOOP VESSEL MINI 0.8X406 BLUE (MISCELLANEOUS) ×4 IMPLANT
LOOPS BLUE MINI 0.8X406MM (MISCELLANEOUS) ×4
NEEDLE FILTER BLUNT 18X 1/2SAF (NEEDLE) ×2
NEEDLE FILTER BLUNT 18X1 1/2 (NEEDLE) ×2 IMPLANT
NEEDLE HYPO 25X1 1.5 SAFETY (NEEDLE) ×4 IMPLANT
NS IRRIG 500ML POUR BTL (IV SOLUTION) ×4 IMPLANT
PACK BASIN MINOR (MISCELLANEOUS) IMPLANT
PACK EXTREMITY (MISCELLANEOUS) ×4 IMPLANT
PAD PREP 24X41 OB/GYN DISP (PERSONAL CARE ITEMS) ×4 IMPLANT
SPONGE GAUZE 2X2 8PLY STER LF (GAUZE/BANDAGES/DRESSINGS)
SPONGE GAUZE 2X2 8PLY STRL LF (GAUZE/BANDAGES/DRESSINGS) IMPLANT
STOCKINETTE STRL 4IN 9604848 (GAUZE/BANDAGES/DRESSINGS) ×4 IMPLANT
SUT GTX CV-6 30 (SUTURE) ×8 IMPLANT
SUT MNCRL+ 5-0 UNDYED PC-3 (SUTURE) ×2 IMPLANT
SUT MON AB 5-0 P3 18 (SUTURE) ×8 IMPLANT
SUT MONOCRYL 5-0 (SUTURE) ×2
SUT PROLENE 5 0 RB 1 DA (SUTURE) ×8 IMPLANT
SUT PROLENE 6 0 BV (SUTURE) ×16 IMPLANT
SUT SILK 2 0 (SUTURE) ×4
SUT SILK 2 0 SH (SUTURE) ×4 IMPLANT
SUT SILK 2-0 18XBRD TIE 12 (SUTURE) ×2 IMPLANT
SUT SILK 3 0 (SUTURE) ×4
SUT SILK 3-0 18XBRD TIE 12 (SUTURE) ×2 IMPLANT
SUT SILK 4 0 (SUTURE) ×4
SUT SILK 4-0 18XBRD TIE 12 (SUTURE) ×2 IMPLANT
SUT VIC AB 0 CT2 27 (SUTURE) IMPLANT
SUT VIC AB 3-0 SH 27 (SUTURE) ×12
SUT VIC AB 3-0 SH 27X BRD (SUTURE) ×6 IMPLANT
SYR 10ML LL (SYRINGE) IMPLANT
SYR 20ML LL LF (SYRINGE) ×4 IMPLANT
SYR 3ML LL SCALE MARK (SYRINGE) ×4 IMPLANT
SYR TOOMEY IRRIG 70ML (MISCELLANEOUS) ×4
SYRINGE TOOMEY IRRIG 70ML (MISCELLANEOUS) ×2 IMPLANT

## 2020-08-05 NOTE — Interval H&P Note (Signed)
History and Physical Interval Note:  08/05/2020 10:26 AM  Janet Olson  has presented today for surgery, with the diagnosis of ESRD.  The various methods of treatment have been discussed with the patient and family. After consideration of risks, benefits and other options for treatment, the patient has consented to  Procedure(s): INSERTION OF ARTERIOVENOUS (AV) GORE-TEX GRAFT ARM(BRACHIAL AXILLARY) (Left) REMOVAL OF A DIALYSIS CATHETER ( FISTULA ) (Left) as a surgical intervention.  The patient's history has been reviewed, patient examined, no change in status, stable for surgery.  I have reviewed the patient's chart and labs.  Questions were answered to the patient's satisfaction.     Hortencia Pilar

## 2020-08-05 NOTE — Anesthesia Postprocedure Evaluation (Signed)
Anesthesia Post Note  Patient: Janet Olson  Procedure(s) Performed: INSERTION OF ARTERIOVENOUS (AV) GRAFT ARM(BRACHIAL AXILLARY) (Left Arm Upper) REMOVAL OF A DIALYSIS CATHETER ( FISTULA ) (Left )  Patient location during evaluation: PACU Anesthesia Type: General Level of consciousness: awake and alert Pain management: pain level controlled Vital Signs Assessment: post-procedure vital signs reviewed and stable Respiratory status: spontaneous breathing, nonlabored ventilation, respiratory function stable and patient connected to nasal cannula oxygen Cardiovascular status: blood pressure returned to baseline and stable Postop Assessment: no apparent nausea or vomiting Anesthetic complications: no   No complications documented.   Last Vitals:  Vitals:   08/05/20 1357 08/05/20 1412  BP: 134/67 135/72  Pulse: (!) 55 (!) 57  Resp: 16 (!) 25  Temp:    SpO2: 98% 98%    Last Pain:  Vitals:   08/05/20 1340  TempSrc:   PainSc: 0-No pain                 Martha Clan

## 2020-08-05 NOTE — Discharge Instructions (Signed)

## 2020-08-05 NOTE — Op Note (Signed)
OPERATIVE NOTE    PROCEDURE: 1.    Creation of a left brachial axillary AV access with Artegraft 2.  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 Hezzie Bump  ANESTHESIA: General  ESTIMATED BLOOD LOSS: 150 cc  FINDING(S): Left cephalic vein is aneurysmal with skin changes and erosions.  The right internal jugular vein appears to be occluded at the confluence with the subclavian and innominate veins.  Left internal jugular vein and central veins on the left appear widely patent  SPECIMEN(S): The resected aneurysmal fistula en bloc to pathology  INDICATIONS:   Janet Olson is a 50 y.o. female who presents with end stage renal disease.  The patient is scheduled for left aneurysm resection with creation of a AV graft for continued access.  The patient is aware the risks include but are not limited to: bleeding, infection, steal syndrome, nerve damage, ischemic monomelic neuropathy, failure to mature, and need for additional procedures.  The patient is aware of the risks of the procedure and elects to proceed forward.    DESCRIPTION: After full informed written consent was obtained from the patient, the patient was brought back to the operating room and placed supine upon the operating table.  Prior to induction, the patient received IV antibiotics.   After obtaining adequate anesthesia, the patient was then prepped and draped in the standard fashion for a left arm access procedure.   A first assistant was required to provide a safe and appropriate environment for executing the surgery.  The assistant was integral in providing retraction, exposure, running suture providing suction and in the closing process.    A linear incision was then created along the  medial border of the aneurysmal portion of the venous aneurysm.  The dissection was carried down through the skin and the soft fatty tissues to expose the aneurysmal vein itself.  Dissection was carried from the level of the brachial artery up to the proximal cephalic vein at the level of the mid deltoid muscle.  Once the medial side of the aneurysmal vein is dissected circumferentially and hemostasis achieved with Bovie cautery a complementary incision was made along the lateral aspect creating an elliptical segment of skin associated with the aneurysm.  Again a lateral incision was carried down to expose the aneurysm itself through the skin and soft tissues bleeding was controlled with Bovie cautery.  Several venous tributaries were encountered and these were ligated with 201 3-0 silk ties and divided.  The aneurysmal segment was then dissected circumferentially in its entirety.   An incision was then created over the arterial anastomosis and this was then dissected out circumferentially so that a profunda clamp could be passed across the anastomotic area for proximal control.  This segment of the cephalic vein was also marked with surgical marker for orientation.     The aneurysm was then ligated with 2-0 silk proximally distally profunda clamp was placed across the arterial anastomotic area and an angled Fogarty clamp across the proximal vein.  The aneurysm was then transected proximally distally and passed off the field en bloc. The wound was then inspected for hemostasis quarter percent Marcaine and Exparel was infused into the skin edges and soft tissues.  Once the wound was completely dry the subcutaneous tissues are reapproximated with running 3-0 Vicryl.  An incision  was then created over the axillary vein dissected carried down through the soft tissues and the axillary vein was exposed and looped proximally and distally tributary that was located posteriorly was also looped individually.    The  Gore tunneler was then delivered onto the field and a subcutaneous tunnel was made from the arterial incision to the venous incision. A 7 millimeter Artegraft was then pulled through the subcutaneous tunnel.  The Artegraft was marked with a surgical marker medication prior to pulling it through the newly created lateral tunnel.  The anastomotic area and the Artegraft were then approximated to each other this was a good match end-to-end anastomosis was fashioned with running 5-0 Prolene.  Flushing maneuvers were performed the graft was flushed and then it was irrigated and clamped just above the anastomosis.  The proximal vein and the distal end of the Artegraft was then approximated this was also a good match and an end-to-end graft to vein anastomosis was fashioned using running 5-0 Prolene.  Flushing maneuvers were performed.  Flow was then established through the AV graft.  Excellent thrill was noted in the graft.   At this point, I irrigated out the surgical wounds.  There was no further active bleeding.  The subcutaneous tissue was reapproximated with a running stitch of 3-0 Vicryl.  The skin was then reapproximated with a running subcuticular stitch of 4-0 Monocryl.  The skin was then cleaned, dried, and reinforced with Dermabond.   The patient tolerated this procedure well.    COMPLICATIONS: None   CONDITION: Janet Olson [facility]  Office: [phone]   08/05/2020, 1:23 PM

## 2020-08-05 NOTE — Anesthesia Procedure Notes (Signed)
Procedure Name: LMA Insertion Date/Time: 08/05/2020 11:22 AM Performed by: Lia Foyer, CRNA Pre-anesthesia Checklist: Patient identified, Emergency Drugs available, Suction available and Patient being monitored Patient Re-evaluated:Patient Re-evaluated prior to induction Oxygen Delivery Method: Circle system utilized Preoxygenation: Pre-oxygenation with 100% oxygen Induction Type: IV induction Ventilation: Mask ventilation with difficulty LMA: LMA inserted LMA Size: 3.5 Number of attempts: 1 Airway Equipment and Method: Patient positioned with wedge pillow Placement Confirmation: positive ETCO2 and breath sounds checked- equal and bilateral Tube secured with: Tape Dental Injury: Teeth and Oropharynx as per pre-operative assessment

## 2020-08-05 NOTE — Anesthesia Preprocedure Evaluation (Signed)
Anesthesia Evaluation  Patient identified by MRN, date of birth, ID band Patient awake    Reviewed: Allergy & Precautions, NPO status , Patient's Chart, lab work & pertinent test results  History of Anesthesia Complications Negative for: history of anesthetic complications  Airway Mallampati: II  TM Distance: >3 FB Neck ROM: Full    Dental  (+) Poor Dentition, Missing   Pulmonary asthma , neg sleep apnea, COPD,  COPD inhaler, Current SmokerPatient did not abstain from smoking.,    breath sounds clear to auscultation- rhonchi (-) wheezing      Cardiovascular hypertension, Pt. on medications +CHF  (-) CAD, (-) Past MI, (-) Cardiac Stents and (-) CABG  Rhythm:Regular Rate:Normal - Systolic murmurs and - Diastolic murmurs    Neuro/Psych  Headaches, neg Seizures PSYCHIATRIC DISORDERS Anxiety Depression    GI/Hepatic Neg liver ROS, GERD  ,  Endo/Other  negative endocrine ROSneg diabetes  Renal/GU ESRF and DialysisRenal disease     Musculoskeletal  (+) Arthritis ,   Abdominal (+) - obese,   Peds  Hematology  (+) anemia ,   Anesthesia Other Findings Past Medical History: No date: (HFpEF) heart failure with preserved ejection fraction ([place])     Comment:  a. 2015 Echo: Nl EF. Mild to mod LVH. Mild PR/MR; b.               04/2017 Echo: EF 55-60%, no rwma, Gr1 DD. Mildly dil LA.               Small effusion; c. 12/2018 Echo: EF 55-60%, mildly dil               LA/PA. Mod dil RA. Mild to mod RV dil. Mildly reduced RV               fxn. Mild TR. Mod elev PASP. No date: Abdominal ascites 01/03/2019: Acute hypoxemic respiratory failure ([place]) 05/17/2017: Acute renal failure (ARF) ([place]) 09/25/2018: Acute respiratory failure (HCC) No date: Anemia No date: Anxiety No date: Arthritis No date: Asthma No date: Cardiomegaly No date: COPD (chronic obstructive pulmonary disease) (HCC) No date: Depression No date: Dialysis patient ([place])      Comment:  MON, WED , FRI No date: Dyspnea No date: ESRD (end stage renal disease) (HCC) No date: GERD (gastroesophageal reflux disease) No date: Headache     Comment:  Migraines No date: Hypertension No date: Nonrheumatic tricuspid (valve) insufficiency 04/21/2018: Respiratory failure with hypoxia (HCC) No date: Ulcer     Comment:  gastric No date: Vitamin D deficiency   Reproductive/Obstetrics                             Anesthesia Physical Anesthesia Plan  ASA: IV  Anesthesia Plan: General   Post-op Pain Management:    Induction: Intravenous  PONV Risk Score and Plan: 1 and Ondansetron and Dexamethasone  Airway Management Planned: LMA  Additional Equipment:   Intra-op Plan:   Post-operative Plan:   Informed Consent: I have reviewed the patients History and Physical, chart, labs and discussed the procedure including the risks, benefits and alternatives for the proposed anesthesia with the patient or authorized representative who has indicated his/her understanding and acceptance.     Dental advisory given  Plan Discussed with: CRNA and Anesthesiologist  Anesthesia Plan Comments:         Anesthesia Quick Evaluation

## 2020-08-05 NOTE — Telephone Encounter (Signed)
   Primary Cardiologist: Ida Rogue, MD  Chart reviewed as part of pre-operative protocol coverage. Given past medical history and time since last visit, based on ACC/AHA guidelines, Janet Olson would be at acceptable risk for the planned procedure without further cardiovascular testing.   I will route this recommendation to the requesting party via Epic fax function and remove from pre-op pool.  Please call with questions.  Jossie Ng. Jasiyah Paulding NP-C    08/05/2020, [address] Office [phone] Fax [phone]

## 2020-08-05 NOTE — Transfer of Care (Signed)
Immediate Anesthesia Transfer of Care Note  Patient: Janet Olson  Procedure(s) Performed: INSERTION OF ARTERIOVENOUS (AV) GRAFT ARM(BRACHIAL AXILLARY) (Left Arm Upper) REMOVAL OF A DIALYSIS CATHETER ( FISTULA ) (Left )  Patient Location: PACU  Anesthesia Type:General  Level of Consciousness: drowsy  Airway & Oxygen Therapy: Patient Spontanous Breathing and Patient connected to face mask oxygen  Post-op Assessment: Report given to RN and Post -op Vital signs reviewed and stable  Post vital signs: Reviewed and stable  Last Vitals:  Vitals Value Taken Time  BP 121/65 08/05/20 1343  Temp    Pulse 55 08/05/20 1349  Resp 17 08/05/20 1349  SpO2 95 % 08/05/20 1349  Vitals shown include unvalidated device data.  Last Pain:  Vitals:   08/05/20 0941  TempSrc: Temporal  PainSc: 0-No pain         Complications: No complications documented.

## 2020-08-08 ENCOUNTER — Other Ambulatory Visit: Payer: Self-pay

## 2020-08-08 ENCOUNTER — Encounter: Payer: Self-pay | Admitting: Vascular Surgery

## 2020-08-08 ENCOUNTER — Emergency Department
Admission: EM | Admit: 2020-08-08 | Discharge: 2020-08-09 | Disposition: A | Discharge status: Home / Self Care | Payer: Medicare Other | Attending: Emergency Medicine | Admitting: Emergency Medicine

## 2020-08-08 DIAGNOSIS — Z992 Dependence on renal dialysis: Secondary | ICD-10-CM | POA: Insufficient documentation

## 2020-08-08 DIAGNOSIS — F17210 Nicotine dependence, cigarettes, uncomplicated: Secondary | ICD-10-CM | POA: Diagnosis not present

## 2020-08-08 DIAGNOSIS — I5033 Acute on chronic diastolic (congestive) heart failure: Secondary | ICD-10-CM | POA: Insufficient documentation

## 2020-08-08 DIAGNOSIS — N186 End stage renal disease: Secondary | ICD-10-CM | POA: Diagnosis not present

## 2020-08-08 DIAGNOSIS — R799 Abnormal finding of blood chemistry, unspecified: Secondary | ICD-10-CM | POA: Diagnosis present

## 2020-08-08 DIAGNOSIS — J45909 Unspecified asthma, uncomplicated: Secondary | ICD-10-CM | POA: Insufficient documentation

## 2020-08-08 DIAGNOSIS — I132 Hypertensive heart and chronic kidney disease with heart failure and with stage 5 chronic kidney disease, or end stage renal disease: Secondary | ICD-10-CM | POA: Diagnosis not present

## 2020-08-08 DIAGNOSIS — Z79899 Other long term (current) drug therapy: Secondary | ICD-10-CM | POA: Insufficient documentation

## 2020-08-08 LAB — COMPREHENSIVE METABOLIC PANEL
ALT: 11 U/L (ref 0–44)
AST: 13 U/L — ABNORMAL LOW (ref 15–41)
Albumin: 3.5 g/dL (ref 3.5–5.0)
Alkaline Phosphatase: 176 U/L — ABNORMAL HIGH (ref 38–126)
Anion gap: 17 — ABNORMAL HIGH (ref 5–15)
BUN: 123 mg/dL — ABNORMAL HIGH (ref 6–20)
CO2: 19 mmol/L — ABNORMAL LOW (ref 22–32)
Calcium: 8.7 mg/dL — ABNORMAL LOW (ref 8.9–10.3)
Chloride: 101 mmol/L (ref 98–111)
Creatinine, Ser: 15.53 mg/dL — ABNORMAL HIGH (ref 0.44–1.00)
GFR calc Af Amer: 3 mL/min — ABNORMAL LOW (ref >60)
GFR calc non Af Amer: 2 mL/min — ABNORMAL LOW (ref >60)
Glucose, Bld: 103 mg/dL — ABNORMAL HIGH (ref 70–99)
Potassium: 5.8 mmol/L — ABNORMAL HIGH (ref 3.5–5.1)
Sodium: 137 mmol/L (ref 135–145)
Total Bilirubin: 1 mg/dL (ref 0.3–1.2)
Total Protein: 7.6 g/dL (ref 6.5–8.1)

## 2020-08-08 LAB — CBC WITH DIFFERENTIAL/PLATELET
Abs Immature Granulocytes: 0.03 10*3/uL (ref 0.00–0.07)
Basophils Absolute: 0.1 10*3/uL (ref 0.0–0.1)
Basophils Relative: 1 %
Eosinophils Absolute: 0.2 10*3/uL (ref 0.0–0.5)
Eosinophils Relative: 4 %
HCT: 28.5 % — ABNORMAL LOW (ref 36.0–46.0)
Hemoglobin: 9.2 g/dL — ABNORMAL LOW (ref 12.0–15.0)
Immature Granulocytes: 1 %
Lymphocytes Relative: 10 %
Lymphs Abs: 0.7 10*3/uL (ref 0.7–4.0)
MCH: 28.6 pg (ref 26.0–34.0)
MCHC: 32.3 g/dL (ref 30.0–36.0)
MCV: 88.5 fL (ref 80.0–100.0)
Monocytes Absolute: 0.6 10*3/uL (ref 0.1–1.0)
Monocytes Relative: 9 %
Neutro Abs: 4.9 10*3/uL (ref 1.7–7.7)
Neutrophils Relative %: 75 %
Platelets: 220 10*3/uL (ref 150–400)
RBC: 3.22 MIL/uL — ABNORMAL LOW (ref 3.87–5.11)
RDW: 14.7 % (ref 11.5–15.5)
WBC: 6.4 10*3/uL (ref 4.0–10.5)
nRBC: 0 % (ref 0.0–0.2)

## 2020-08-08 NOTE — Discharge Instructions (Signed)
Please return to your dialysis center before 5 PM to have your session completed today.  Your labs have been reviewed by our nephrologist, and he need to have your dialysis done at your center this evening.  Return to the ER if you are unable to come pleat dialysis today, if you have any new concerns or symptoms arise please return to the emergency room.  It is very important to get your dialysis done this evening

## 2020-08-08 NOTE — ED Notes (Signed)
Patient discharged home so that she can receive dialysis, patient received discharge papers. Patient appropriate and cooperative. NAD noted.  Patient refused vital signs and refused to sign stating "she I have to be at dialysis by 5p"

## 2020-08-08 NOTE — ED Triage Notes (Signed)
Pt via pov from dialysis; sent to have her potassium tested before dialysis treatment or to have dialysis done here. Pt has MWF dialysis. Pt alert & oriented, nad noted.

## 2020-08-08 NOTE — ED Provider Notes (Signed)
[facility] Emergency Department Provider Note   ____________________________________________   First MD Initiated Contact with Patient 08/08/20 1532     (approximate)  I have reviewed the triage vital signs and the nursing notes.   HISTORY  Chief Complaint Abnormal Lab    HPI Janet Olson is a 50 y.o. female here for evaluation of her lab work before resuming dialysis  Patient had surgery a few days ago for a fistula and skipped her dialysis that day.  She reports she checked to her dialysis center today but they were unable to do dialysis as she need to have lab work done and they could not accommodate that.  She was sent to the ER to have labs done prior to resuming dialysis  She denies any recent illness.  No chest pain no fevers no chills.  She reports she has not gained any significant weight.  She gets crampy intermittent abdominal pain but reports that is a longstanding issue since she has been on dialysis nothing new  Dr. Holley Raring is her primary nephrologist she attends the [place]   Past Medical History:  Diagnosis Date  . (HFpEF) heart failure with preserved ejection fraction ([place])    a. 2015 Echo: Nl EF. Mild to mod LVH. Mild PR/MR; b. 04/2017 Echo: EF 55-60%, no rwma, Gr1 DD. Mildly dil LA. Small effusion; c. 12/2018 Echo: EF 55-60%, mildly dil LA/PA. Mod dil RA. Mild to mod RV dil. Mildly reduced RV fxn. Mild TR. Mod elev PASP.  Marland Kitchen Abdominal ascites   . Acute hypoxemic respiratory failure ([place]) 01/03/2019  . Acute renal failure (ARF) ([place]) 05/17/2017  . Acute respiratory failure ([place]) 09/25/2018  . Anemia   . Anxiety   . Arthritis   . Asthma   . Cardiomegaly   . COPD (chronic obstructive pulmonary disease) ([place])   . Depression   . Dialysis patient ([place])    MON, WED , FRI  . Dyspnea   . ESRD (end stage renal disease) ([place])   . GERD (gastroesophageal reflux disease)   . Headache    Migraines  . Hypertension   .  Nonrheumatic tricuspid (valve) insufficiency   . Respiratory failure with hypoxia ([place]) 04/21/2018  . Ulcer    gastric  . Vitamin D deficiency     Patient Active Problem List   Diagnosis Date Noted  . Abdominal ascites   . Acute on chronic diastolic CHF (congestive heart failure) ([place]) 03/03/2020  . Anasarca associated with disorder of kidney 03/03/2020  . Hyponatremia with excess extracellular fluid volume 03/03/2020  . Hyperkalemia 03/03/2020  . Diastolic dysfunction with chronic heart failure ([place]) 04/28/2019  . Nonrheumatic tricuspid valve regurgitation 04/28/2019  . Tobacco use 04/28/2019  . Pericardial effusion 01/26/2019  . Shortness of breath 01/26/2019  . Altered mental status 12/04/2018  . Renal dialysis device, implant, or graft complication 31/49/7026  . ESRD on dialysis ([place]) 08/02/2017  . COPD (chronic obstructive pulmonary disease) ([place]) 06/20/2017  . Anemia in chronic kidney disease 06/07/2017  . 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 Mass of left axilla 12/07/2013    Past Surgical History:  Procedure Laterality Date  . A/V FISTULAGRAM Left 04/17/2018   Procedure: A/V FISTULAGRAM;  Surgeon: Algernon Huxley, MD;  Location: [place] [facility];  Service: Cardiovascular;  Laterality: Left;  . A/V FISTULAGRAM Left 05/27/2018   Procedure: A/V FISTULAGRAM;  Surgeon: Katha Cabal, MD;  Location: [place] [facility];  Service: Cardiovascular;  Laterality:  Left;  . A/V FISTULAGRAM Left 07/05/2020   Procedure: A/V FISTULAGRAM;  Surgeon: Katha Cabal, MD;  Location: [place] [facility];  Service: Cardiovascular;  Laterality: Left;  . AV FISTULA PLACEMENT Left 08/08/2017   Procedure: ARTERIOVENOUS (AV) FISTULA CREATION ( BRACHIOCEPHALIC );  Surgeon: Algernon Huxley, MD;  Location: [facility];  Service: Vascular;  Laterality: Left;  . AV FISTULA PLACEMENT Left 08/05/2020   Procedure: INSERTION OF ARTERIOVENOUS (AV) GRAFT ARM(BRACHIAL AXILLARY);  Surgeon: Katha Cabal, MD;  Location: [facility];  Service: Vascular;  Laterality: Left;  . CESAREAN SECTION     x 4 (1988, 1991, 1997, 2000 )  . COLONOSCOPY WITH PROPOFOL N/A 04/24/2018   Procedure: COLONOSCOPY WITH PROPOFOL;  Surgeon: Lin Landsman, MD;  Location: [facility] END[facility];  Service: Gastroenterology;  Laterality: N/A;  . DIALYSIS/PERMA CATHETER INSERTION N/A 07/16/2017   Procedure: Dialysis/Perma Catheter Insertion;  Surgeon: Katha Cabal, MD;  Location: [place] [facility];  Service: Cardiovascular;  Laterality: N/A;  . DIALYSIS/PERMA CATHETER REMOVAL N/A 10/29/2017   Procedure: DIALYSIS/PERMA CATHETER REMOVAL;  Surgeon: Katha Cabal, MD;  Location: [place] [facility];  Service: Cardiovascular;  Laterality: N/A;  . excision of lymph nodes Bilateral 2014   bilateral under arms.  Marland Kitchen PARATHYROID IMPLANT REMOVAL    . REMOVAL OF A DIALYSIS CATHETER Left 08/05/2020   Procedure: REMOVAL OF A DIALYSIS CATHETER ( FISTULA );  Surgeon: Katha Cabal, MD;  Location: [facility];  Service: Vascular;  Laterality: Left;    Prior to Admission medications   Medication Sig Start Date End Date Taking? Authorizing Provider  albuterol (PROVENTIL) (2.5 MG/3ML) 0.083% nebulizer solution Take 2.5 mg by nebulization every 6 (six) hours as needed for wheezing or shortness of breath.     [provider]  albuterol (VENTOLIN HFA) 108 (90 Base) MCG/ACT inhaler Inhale 2 puffs into the lungs every 6 (six) hours as needed for wheezing or shortness of breath.  12/22/19   [provider]  amLODipine (NORVASC) 10 MG tablet Take 1 tablet (10 mg total) by mouth daily. 06/12/17   Doles-Johnson, Teah, NP  benzonatate (TESSALON) 100 MG capsule Take 100 mg by mouth 2 (two) times daily as needed for cough.  08/03/19   [provider]  calcium acetate (PHOSLO) 667 MG capsule Take 2 capsules (1,334 mg total) by mouth 3 (three) times daily with meals. 09/27/18   Loletha Grayer, MD    chlorpheniramine-HYDROcodone (TUSSIONEX) 10-8 MG/5ML SUER Take 5 mLs by mouth daily as needed for cough.     [provider]  docusate sodium (COLACE) 100 MG capsule Take 100 mg by mouth daily as needed for moderate constipation.     [provider]  DULERA 200-5 MCG/ACT AERO Inhale 2 puffs into the lungs 2 (two) times daily. 01/16/18   [provider]  DULoxetine (CYMBALTA) 60 MG capsule Take 60 mg by mouth 2 (two) times daily.  12/05/17   [provider]  hydrOXYzine (ATARAX/VISTARIL) 25 MG tablet Take 25 mg by mouth every 8 (eight) hours as needed for itching.  12/21/19   [provider]  Melatonin 10 MG CAPS Take 10 mg by mouth at bedtime as needed (sleep).     [provider]  metoprolol tartrate (LOPRESSOR) 50 MG tablet Take 50 mg by mouth 2 (two) times daily. 02/09/20   [provider]  multivitamin (RENA-VIT) TABS tablet Take 1 tablet by mouth daily.  08/23/17   [provider]  oxyCODONE (  OXY IR/ROXICODONE) 5 MG immediate release tablet Take 1 tablet (5 mg total) by mouth every 6 (six) hours as needed for severe pain. 07/21/20   Schnier, Dolores Lory, MD  oxyCODONE-acetaminophen (PERCOCET) 5-325 MG tablet Take 1-2 tablets by mouth every 6 (six) hours as needed for moderate pain or severe pain. 08/05/20 08/05/21  Schnier, Dolores Lory, MD  pantoprazole (PROTONIX) 40 MG tablet Take 1 tablet (40 mg total) by mouth daily. 12/20/18   Schuyler Amor, MD  QUEtiapine (SEROQUEL) 25 MG tablet Take 25 mg by mouth at bedtime as needed (sleep).  05/24/20   [provider]  torsemide (DEMADEX) 100 MG tablet Take 1 tablet (100 mg total) by mouth daily. 09/27/18   Loletha Grayer, MD  Vitamin D, Ergocalciferol, (DRISDOL) 50000 units CAPS capsule Take 50,000 Units by mouth every Sunday.  10/22/17   [provider]    Allergies Patient has no known allergies.  Family History  Problem Relation Age of Onset  . Diabetes Mother      Social History Social History   Tobacco Use  . Smoking status: Current Every Day Smoker    Packs/day: 0.50    Years: 30.00    Pack years: 15.00    Types: Cigarettes  . Smokeless tobacco: Never Used  Vaping Use  . Vaping Use: Never used  Substance Use Topics  . Alcohol use: No  . Drug use: No    Review of Systems Constitutional: No fever/chills Eyes: No visual changes. ENT: No sore throat. Cardiovascular: Denies chest pain. Respiratory: Denies shortness of breath. Gastrointestinal: See HPI Genitourinary: Negative for dysuria.  Does urinate somewhat still Musculoskeletal: Negative for back pain.  Reports left forearm wound is healing well.  Has a dialysis catheter in the right upper chest Skin: Negative for rash. Neurological: Negative for headaches, areas of focal weakness or numbness.  She drives her self, reports she would like to drive to her dialysis center if her labs are okay  ____________________________________________   PHYSICAL EXAM:  VITAL SIGNS: ED Triage Vitals  Enc Vitals Group     BP 08/08/20 1258 120/68     Pulse Rate 08/08/20 1258 69     Resp 08/08/20 1258 18     Temp 08/08/20 1258 98.5 F (36.9 C)     Temp Source 08/08/20 1258 Oral     SpO2 08/08/20 1258 96 %     Weight 08/08/20 1300 167 lb 15.9 oz (76.2 kg)     Height 08/08/20 1300 5\' 6"  (1.676 m)     Head Circumference --      Peak Flow --      Pain Score 08/08/20 1300 6     Pain Loc --      Pain Edu? --      Excl. in [place]? --     Constitutional: Alert and oriented. Well appearing and in no acute distress.  Seated comfortably in her chair.  Pleasant and in no distress. Eyes: Conjunctivae are normal. Head: Atraumatic. Nose: No congestion/rhinnorhea. Mouth/Throat: Mucous membranes are moist. Neck: No stridor.  Cardiovascular: Normal rate, regular rhythm. Grossly normal heart sounds.  Good peripheral circulation.  Right upper chest hemodialysis catheter clean dry intact Respiratory:  Normal respiratory effort.  No retractions. Lungs CTAB. Gastrointestinal: Soft and nontender. No distention. Musculoskeletal: No lower extremity tenderness.  Left forearm wound peers to be healing well.  No ischemia or reduction in pulse in the left hand noted.  Mild edema in the ankles bilateral patient reports this  is normal for her. Neurologic:  Normal speech and language. No gross focal neurologic deficits are appreciated.  Skin:  Skin is warm, dry and intact. No rash noted. Psychiatric: Mood and affect are normal. Speech and behavior are normal.  ____________________________________________   LABS (all labs ordered are listed, but only abnormal results are displayed)  Labs Reviewed  COMPREHENSIVE METABOLIC PANEL - Abnormal; Notable for the following components:      Result Value   Potassium 5.8 (*)    CO2 19 (*)    Glucose, Bld 103 (*)    BUN 123 (*)    Creatinine, Ser 15.53 (*)    Calcium 8.7 (*)    AST 13 (*)    Alkaline Phosphatase 176 (*)    GFR calc non Af Amer 2 (*)    GFR calc Af Amer 3 (*)    Anion gap 17 (*)    All other components within normal limits  CBC WITH DIFFERENTIAL/PLATELET - Abnormal; Notable for the following components:   RBC 3.22 (*)    Hemoglobin 9.2 (*)    HCT 28.5 (*)    All other components within normal limits   ____________________________________________  EKG   ____________________________________________  RADIOLOGY   ____________________________________________   PROCEDURES  Procedure(s) performed: None  Procedures  Critical Care performed: No  ____________________________________________   INITIAL IMPRESSION / ASSESSMENT AND PLAN / ED COURSE  Pertinent labs & imaging results that were available during my care of the patient were reviewed by me and considered in my medical decision making (see chart for details).   Patient here for lab evaluation prior to resuming dialysis.  Unable to get dialysis at her center today due  to lack of recent lab work.  Labs here are reviewed, discussed including her potassium level with Dr. Juleen China our nephrologist, he advises patient be discharged and have dialysis done at [place] today.  They can accommodate her up until 5 PM.  Discussed with the patient, she is agreeable with this plan and intends to drive to her dialysis session direct from the ER.  Patient understanding the need to resume her dialysis treatments, she is agreeable with the plan.  Plan coordinated through Dr. Juleen China nephrology  Return precautions and treatment recommendations and follow-up discussed with the patient who is agreeable with the plan.  Patient understands that if she cannot have her dialysis completed today at her center she is to return to the ER today, she is in agreement      ____________________________________________   FINAL CLINICAL IMPRESSION(S) / ED DIAGNOSES  Final diagnoses:  ESRD (end stage renal disease) on dialysis [facility])        Note:  This document was prepared using Systems analyst and may include unintentional dictation errors       Delman Kitten, MD 08/08/20 1556

## 2020-08-09 ENCOUNTER — Other Ambulatory Visit: Payer: Self-pay

## 2020-08-09 ENCOUNTER — Ambulatory Visit
Admission: RE | Admit: 2020-08-09 | Discharge: 2020-08-09 | Disposition: A | Discharge status: Home / Self Care | Payer: Medicare Other | Source: Ambulatory Visit | Attending: Nephrology | Admitting: Nephrology

## 2020-08-09 DIAGNOSIS — R188 Other ascites: Secondary | ICD-10-CM | POA: Insufficient documentation

## 2020-08-09 DIAGNOSIS — I132 Hypertensive heart and chronic kidney disease with heart failure and with stage 5 chronic kidney disease, or end stage renal disease: Secondary | ICD-10-CM | POA: Diagnosis not present

## 2020-08-09 LAB — SURGICAL PATHOLOGY

## 2020-08-24 ENCOUNTER — Other Ambulatory Visit (INDEPENDENT_AMBULATORY_CARE_PROVIDER_SITE_OTHER): Payer: Self-pay | Admitting: Vascular Surgery

## 2020-08-24 DIAGNOSIS — Z95828 Presence of other vascular implants and grafts: Secondary | ICD-10-CM

## 2020-08-25 ENCOUNTER — Ambulatory Visit (INDEPENDENT_AMBULATORY_CARE_PROVIDER_SITE_OTHER): Payer: Medicare Other

## 2020-08-25 ENCOUNTER — Other Ambulatory Visit: Payer: Self-pay

## 2020-08-25 ENCOUNTER — Encounter (INDEPENDENT_AMBULATORY_CARE_PROVIDER_SITE_OTHER): Payer: Self-pay | Admitting: Vascular Surgery

## 2020-08-25 ENCOUNTER — Ambulatory Visit (INDEPENDENT_AMBULATORY_CARE_PROVIDER_SITE_OTHER): Payer: Medicare Other | Admitting: Vascular Surgery

## 2020-08-25 VITALS — BP 142/88 | HR 87 | Resp 16 | Wt 180.8 lb

## 2020-08-25 DIAGNOSIS — Z95828 Presence of other vascular implants and grafts: Secondary | ICD-10-CM

## 2020-08-25 DIAGNOSIS — J449 Chronic obstructive pulmonary disease, unspecified: Secondary | ICD-10-CM

## 2020-08-25 DIAGNOSIS — N186 End stage renal disease: Secondary | ICD-10-CM

## 2020-08-25 DIAGNOSIS — Z992 Dependence on renal dialysis: Secondary | ICD-10-CM

## 2020-08-25 DIAGNOSIS — T829XXS Unspecified complication of cardiac and vascular prosthetic device, implant and graft, sequela: Secondary | ICD-10-CM

## 2020-08-25 DIAGNOSIS — I5032 Chronic diastolic (congestive) heart failure: Secondary | ICD-10-CM

## 2020-08-25 MED ORDER — OXYCODONE HCL 5 MG PO TABS: 5.0000 mg | ORAL_TABLET | Freq: Four times a day (QID) | ORAL | 0 refills | Status: DC | PRN

## 2020-08-25 NOTE — Progress Notes (Signed)
Patient ID: Janet Olson, female   DOB: [birth year], 50 y.o.   MRN: [id number]  No chief complaint on file.   HPI Janet Olson is a 50 y.o. female.    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  No focal hemodynamically significant stenosis.     Past Medical History:  Diagnosis Date  . (HFpEF) heart failure with preserved ejection fraction ([place])    a. 2015 Echo: Nl EF. Mild to mod LVH. Mild PR/MR; b. 04/2017 Echo: EF 55-60%, no rwma, Gr1 DD. Mildly dil LA. Small effusion; c. 12/2018 Echo: EF 55-60%, mildly dil LA/PA. Mod dil RA. Mild to mod RV dil. Mildly reduced RV fxn. Mild TR. Mod elev PASP.  Marland Kitchen Abdominal ascites   . Acute hypoxemic respiratory failure ([place]) 01/03/2019  . Acute renal failure (ARF) ([place]) 05/17/2017  . Acute respiratory failure ([place]) 09/25/2018  . Anemia   . Anxiety   . Arthritis   . Asthma   . Cardiomegaly   . COPD (chronic obstructive pulmonary disease) ([place])   . Depression   . Dialysis patient ([place])    MON, WED , FRI  . Dyspnea   . ESRD (end stage renal disease) ([place])   . GERD (gastroesophageal reflux disease)   . Headache    Migraines  . Hypertension   . Nonrheumatic tricuspid (valve) insufficiency   . Respiratory failure with hypoxia ([place]) 04/21/2018  . Ulcer    gastric  . Vitamin D deficiency     Past Surgical History:  Procedure Laterality Date  . A/V FISTULAGRAM Left 04/17/2018   Procedure: A/V FISTULAGRAM;  Surgeon: Algernon Huxley, MD;  Location: [place] [facility];  Service: Cardiovascular;  Laterality: Left;  . A/V FISTULAGRAM Left 05/27/2018   Procedure: A/V FISTULAGRAM;  Surgeon: Katha Cabal, MD;  Location: [place] [facility];  Service: Cardiovascular;   Laterality: Left;  . A/V FISTULAGRAM Left 07/05/2020   Procedure: A/V FISTULAGRAM;  Surgeon: Katha Cabal, MD;  Location: [place] [facility];  Service: Cardiovascular;  Laterality: Left;  . AV FISTULA PLACEMENT Left 08/08/2017   Procedure: ARTERIOVENOUS (AV) FISTULA CREATION ( BRACHIOCEPHALIC );  Surgeon: Algernon Huxley, MD;  Location: [facility];  Service: Vascular;  Laterality: Left;  . AV FISTULA PLACEMENT Left 08/05/2020   Procedure: INSERTION OF ARTERIOVENOUS (AV) GRAFT ARM(BRACHIAL AXILLARY);  Surgeon: Katha Cabal, MD;  Location: [facility];  Service: Vascular;  Laterality: Left;  . CESAREAN SECTION     x 4 (1988, 1991, 1997, 2000 )  . COLONOSCOPY WITH PROPOFOL N/A 04/24/2018   Procedure: COLONOSCOPY WITH PROPOFOL;  Surgeon: Lin Landsman, MD;  Location: [facility] END[facility]COPY;  Service: Gastroenterology;  Laterality: N/A;  . DIALYSIS/PERMA CATHETER INSERTION N/A 07/16/2017   Procedure: Dialysis/Perma Catheter Insertion;  Surgeon: Katha Cabal, MD;  Location: [place] [facility];  Service: Cardiovascular;  Laterality: N/A;  . DIALYSIS/PERMA CATHETER REMOVAL N/A 10/29/2017   Procedure: DIALYSIS/PERMA CATHETER REMOVAL;  Surgeon: Katha Cabal, MD;  Location: [place] [facility];  Service: Cardiovascular;  Laterality: N/A;  . excision of lymph nodes Bilateral 2014   bilateral under arms.  Marland Kitchen PARATHYROID IMPLANT REMOVAL    . REMOVAL OF A  DIALYSIS CATHETER Left 08/05/2020   Procedure: REMOVAL OF A DIALYSIS CATHETER ( FISTULA );  Surgeon: Katha Cabal, MD;  Location: [facility];  Service: Vascular;  Laterality: Left;      No Known Allergies  Current Outpatient Medications  Medication Sig Dispense Refill  . albuterol (PROVENTIL) (2.5 MG/3ML) 0.083% nebulizer solution Take 2.5 mg by nebulization every 6 (six) hours as needed for wheezing or shortness of breath.     Marland Kitchen albuterol (VENTOLIN HFA) 108 (90 Base) MCG/ACT inhaler Inhale 2 puffs into the lungs every 6 (six) hours as  needed for wheezing or shortness of breath.     Marland Kitchen amLODipine (NORVASC) 10 MG tablet Take 1 tablet (10 mg total) by mouth daily. 30 tablet 3  . benzonatate (TESSALON) 100 MG capsule Take 100 mg by mouth 2 (two) times daily as needed for cough.     . calcium acetate (PHOSLO) 667 MG capsule Take 2 capsules (1,334 mg total) by mouth 3 (three) times daily with meals. 180 capsule 0  . chlorpheniramine-HYDROcodone (TUSSIONEX) 10-8 MG/5ML SUER Take 5 mLs by mouth daily as needed for cough.     . docusate sodium (COLACE) 100 MG capsule Take 100 mg by mouth daily as needed for moderate constipation.     . DULERA 200-5 MCG/ACT AERO Inhale 2 puffs into the lungs 2 (two) times daily.  0  . DULoxetine (CYMBALTA) 60 MG capsule Take 60 mg by mouth 2 (two) times daily.   0  . hydrOXYzine (ATARAX/VISTARIL) 25 MG tablet Take 25 mg by mouth every 8 (eight) hours as needed for itching.     . Melatonin 10 MG CAPS Take 10 mg by mouth at bedtime as needed (sleep).     . metoprolol tartrate (LOPRESSOR) 50 MG tablet Take 50 mg by mouth 2 (two) times daily.    . multivitamin (RENA-VIT) TABS tablet Take 1 tablet by mouth daily.   1  . oxyCODONE (OXY IR/ROXICODONE) 5 MG immediate release tablet Take 1 tablet (5 mg total) by mouth every 6 (six) hours as needed for severe pain. 30 tablet 0  . oxyCODONE-acetaminophen (PERCOCET) 5-325 MG tablet Take 1-2 tablets by mouth every 6 (six) hours as needed for moderate pain or severe pain. 36 tablet 0  . pantoprazole (PROTONIX) 40 MG tablet Take 1 tablet (40 mg total) by mouth daily. 30 tablet 0  . QUEtiapine (SEROQUEL) 25 MG tablet Take 25 mg by mouth at bedtime as needed (sleep).     . torsemide (DEMADEX) 100 MG tablet Take 1 tablet (100 mg total) by mouth daily. 30 tablet 0  . Vitamin D, Ergocalciferol, (DRISDOL) 50000 units CAPS capsule Take 50,000 Units by mouth every Sunday.   0   No current facility-administered medications for this visit.        Physical Exam LMP  06/10/2018 (Approximate)  Gen:  WD/WN, NAD Skin: incision C/D/I; good thrill good bruit     Assessment/Plan: 1. Complication from renal dialysis device, sequela AV graft healing well no ready to use yet but I believe it will be ready in about one month  Still having pain so I will refill her RX  2. ESRD on dialysis [facility]) At the present time the patient has adequate dialysis access.  Continue hemodialysis as ordered without interruption.  Avoid nephrotoxic medications and dehydration.  Further plans per nephrology  3. Diastolic dysfunction with chronic heart failure (HCC) Continue cardiac and antihypertensive medications as already ordered and reviewed, no changes at this time.  Continue statin as ordered and reviewed, no changes at this time  Nitrates PRN for chest pain   4. Chronic obstructive pulmonary disease, unspecified COPD type ([place]) Continue pulmonary medications and aerosols as already ordered, these medications have been reviewed and there are no changes at this time.        Hortencia Pilar 08/25/2020, 8:49 AM   This note was created with Dragon medical transcription system.  Any errors from dictation are unintentional.

## 2020-09-20 ENCOUNTER — Other Ambulatory Visit: Payer: Self-pay | Admitting: Nephrology

## 2020-09-20 ENCOUNTER — Ambulatory Visit
Admission: RE | Admit: 2020-09-20 | Discharge: 2020-09-20 | Disposition: A | Discharge status: Home / Self Care | Payer: Medicare Other | Source: Ambulatory Visit | Attending: Nephrology | Admitting: Nephrology

## 2020-09-20 ENCOUNTER — Other Ambulatory Visit: Payer: Self-pay

## 2020-09-20 DIAGNOSIS — R188 Other ascites: Secondary | ICD-10-CM | POA: Insufficient documentation

## 2020-09-20 NOTE — Procedures (Signed)
Ultrasound-guided therapeutic paracentesis performed yielding 4.9 liters of yellow fluid. No immediate complications. EBL none.

## 2020-09-22 ENCOUNTER — Ambulatory Visit (INDEPENDENT_AMBULATORY_CARE_PROVIDER_SITE_OTHER): Payer: Medicare Other | Admitting: Vascular Surgery

## 2020-09-22 ENCOUNTER — Encounter (INDEPENDENT_AMBULATORY_CARE_PROVIDER_SITE_OTHER): Payer: Self-pay | Admitting: Vascular Surgery

## 2020-09-22 ENCOUNTER — Other Ambulatory Visit: Payer: Self-pay

## 2020-09-22 VITALS — BP 127/76 | HR 78 | Resp 16 | Wt 178.8 lb

## 2020-09-22 DIAGNOSIS — T829XXS Unspecified complication of cardiac and vascular prosthetic device, implant and graft, sequela: Secondary | ICD-10-CM

## 2020-09-22 NOTE — Progress Notes (Signed)
Patient ID: Janet Olson, female   DOB: August 20, 1970, 50 y.o.   MRN: [id number]  Chief Complaint  Patient presents with  . Follow-up    3wk follow up    HPI Janet Olson is a 50 y.o. female.    The patient returns to the office for followup of their dialysis access. She notes her left arm is still a little sore and a bit swollen.   The patient denies hand pain or other symptoms consistent with steal phenomena.  No significant arm swelling.  The patient denies redness or swelling at the access site. The patient denies fever or chills at home or while on dialysis.  The patient denies amaurosis fugax or recent TIA symptoms. There are no recent neurological changes noted. The patient denies claudication symptoms or rest pain symptoms. The patient denies history of DVT, PE or superficial thrombophlebitis. The patient denies recent episodes of angina or shortness of breath.     Past Medical History:  Diagnosis Date  . (HFpEF) heart failure with preserved ejection fraction ([place])    a. 2015 Echo: Nl EF. Mild to mod LVH. Mild PR/MR; b. 04/2017 Echo: EF 55-60%, no rwma, Gr1 DD. Mildly dil LA. Small effusion; c. 12/2018 Echo: EF 55-60%, mildly dil LA/PA. Mod dil RA. Mild to mod RV dil. Mildly reduced RV fxn. Mild TR. Mod elev PASP.  Marland Kitchen Abdominal ascites   . Acute hypoxemic respiratory failure ([place]) 01/03/2019  . Acute renal failure (ARF) ([place]) 05/17/2017  . Acute respiratory failure ([place]) 09/25/2018  . Anemia   . Anxiety   . Arthritis   . Asthma   . Cardiomegaly   . COPD (chronic obstructive pulmonary disease) ([place])   . Depression   . Dialysis patient ([place])    MON, WED , FRI  . Dyspnea   . ESRD (end stage renal disease) ([place])   . GERD (gastroesophageal reflux disease)   . Headache    Migraines  . Hypertension   . Nonrheumatic tricuspid (valve) insufficiency   . Respiratory failure with hypoxia ([place]) 04/21/2018  . Ulcer    gastric  . Vitamin D deficiency     Past Surgical  History:  Procedure Laterality Date  . A/V FISTULAGRAM Left 04/17/2018   Procedure: A/V FISTULAGRAM;  Surgeon: Algernon Huxley, MD;  Location: [place] [facility];  Service: Cardiovascular;  Laterality: Left;  . A/V FISTULAGRAM Left 05/27/2018   Procedure: A/V FISTULAGRAM;  Surgeon: Katha Cabal, MD;  Location: [place] [facility];  Service: Cardiovascular;  Laterality: Left;  . A/V FISTULAGRAM Left 07/05/2020   Procedure: A/V FISTULAGRAM;  Surgeon: Katha Cabal, MD;  Location: [place] [facility];  Service: Cardiovascular;  Laterality: Left;  . AV FISTULA PLACEMENT Left 08/08/2017   Procedure: ARTERIOVENOUS (AV) FISTULA CREATION ( BRACHIOCEPHALIC );  Surgeon: Algernon Huxley, MD;  Location: [facility];  Service: Vascular;  Laterality: Left;  . AV FISTULA PLACEMENT Left 08/05/2020   Procedure: INSERTION OF ARTERIOVENOUS (AV) GRAFT ARM(BRACHIAL AXILLARY);  Surgeon: Katha Cabal, MD;  Location: [facility];  Service: Vascular;  Laterality: Left;  . CESAREAN SECTION     x 4 (1988, 1991, 1997, 2000 )  . COLONOSCOPY WITH PROPOFOL N/A 04/24/2018   Procedure: COLONOSCOPY WITH PROPOFOL;  Surgeon: Lin Landsman, MD;  Location: [facility]COPY;  Service: Gastroenterology;  Laterality: N/A;  . DIALYSIS/PERMA CATHETER INSERTION N/A 07/16/2017   Procedure: Dialysis/Perma Catheter Insertion;  Surgeon: Katha Cabal, MD;  Location: [place]  [facility];  Service: Cardiovascular;  Laterality: N/A;  . DIALYSIS/PERMA CATHETER REMOVAL N/A 10/29/2017   Procedure: DIALYSIS/PERMA CATHETER REMOVAL;  Surgeon: Katha Cabal, MD;  Location: [place] [facility];  Service: Cardiovascular;  Laterality: N/A;  . excision of lymph nodes Bilateral 2014   bilateral under arms.  Marland Kitchen PARATHYROID IMPLANT REMOVAL    . REMOVAL OF A DIALYSIS CATHETER Left 08/05/2020   Procedure: REMOVAL OF A DIALYSIS CATHETER ( FISTULA );  Surgeon: Katha Cabal, MD;  Location: [facility];  Service: Vascular;  Laterality: Left;       No Known Allergies  Current Outpatient Medications  Medication Sig Dispense Refill  . albuterol (PROVENTIL) (2.5 MG/3ML) 0.083% nebulizer solution Take 2.5 mg by nebulization every 6 (six) hours as needed for wheezing or shortness of breath.     Marland Kitchen albuterol (VENTOLIN HFA) 108 (90 Base) MCG/ACT inhaler Inhale 2 puffs into the lungs every 6 (six) hours as needed for wheezing or shortness of breath.     Marland Kitchen amLODipine (NORVASC) 10 MG tablet Take 1 tablet (10 mg total) by mouth daily. 30 tablet 3  . benzonatate (TESSALON) 100 MG capsule Take 100 mg by mouth 2 (two) times daily as needed for cough.     . calcium acetate (PHOSLO) 667 MG capsule Take 2 capsules (1,334 mg total) by mouth 3 (three) times daily with meals. 180 capsule 0  . chlorpheniramine-HYDROcodone (TUSSIONEX) 10-8 MG/5ML SUER Take 5 mLs by mouth daily as needed for cough.     . docusate sodium (COLACE) 100 MG capsule Take 100 mg by mouth daily as needed for moderate constipation.     . DULERA 200-5 MCG/ACT AERO Inhale 2 puffs into the lungs 2 (two) times daily.  0  . DULoxetine (CYMBALTA) 60 MG capsule Take 60 mg by mouth 2 (two) times daily.   0  . hydrOXYzine (ATARAX/VISTARIL) 25 MG tablet Take 25 mg by mouth every 8 (eight) hours as needed for itching.     . Melatonin 10 MG CAPS Take 10 mg by mouth at bedtime as needed (sleep).     . metoprolol tartrate (LOPRESSOR) 50 MG tablet Take 50 mg by mouth 2 (two) times daily.    . multivitamin (RENA-VIT) TABS tablet Take 1 tablet by mouth daily.   1  . oxyCODONE (OXY IR/ROXICODONE) 5 MG immediate release tablet Take 1 tablet (5 mg total) by mouth every 6 (six) hours as needed for moderate pain or severe pain. 30 tablet 0  . pantoprazole (PROTONIX) 40 MG tablet Take 1 tablet (40 mg total) by mouth daily. 30 tablet 0  . propranolol (INDERAL) 40 MG tablet Take 40 mg by mouth daily.    . QUEtiapine (SEROQUEL) 25 MG tablet Take 25 mg by mouth at bedtime as needed (sleep).     .  torsemide (DEMADEX) 100 MG tablet Take 1 tablet (100 mg total) by mouth daily. 30 tablet 0  . Vitamin D, Ergocalciferol, (DRISDOL) 50000 units CAPS capsule Take 50,000 Units by mouth every Sunday.   0  . oxyCODONE-acetaminophen (PERCOCET) 5-325 MG tablet Take 1-2 tablets by mouth every 6 (six) hours as needed for moderate pain or severe pain. (Patient not taking: Reported on 08/25/2020) 36 tablet 0   No current facility-administered medications for this visit.        Physical Exam BP 127/76 (BP Location: Right Arm)   Pulse 78   Resp 16   Wt 178 lb 12.8 oz (81.1 kg)   LMP 06/10/2018 (  Approximate)   BMI 28.86 kg/m  Gen:  WD/WN, NAD Skin: incision C/D/I, left AV graft good thrill good bruit.  Still some edema present over the graft     Assessment/Plan: 1. Complication from renal dialysis device, sequela Recommend:  Dialysis can begin cannulating in mid October and a note was given to the patient  The patient is doing well and currently has adequate dialysis access. The patient's dialysis center is not reporting any access issues. Flow pattern is stable when compared to the prior ultrasound.  The patient should have a duplex ultrasound of the dialysis access in 6 months. The patient will follow-up with me in the office after each ultrasound    - VAS [place] (AVF, AVG); Future      Hortencia Pilar 09/22/2020, 11:08 AM   This note was created with Dragon medical transcription system.  Any errors from dictation are unintentional.

## 2020-10-18 ENCOUNTER — Ambulatory Visit: Admission: RE | Admit: 2020-10-18 | Payer: Medicare Other | Source: Ambulatory Visit

## 2020-10-19 ENCOUNTER — Telehealth (INDEPENDENT_AMBULATORY_CARE_PROVIDER_SITE_OTHER): Payer: Self-pay

## 2020-10-19 NOTE — Telephone Encounter (Signed)
I attempted to contact the patient to schedule the permcath and was not able to leave a message. I called the patient's dialysis center at [facility] and spoke with someone and let them know about the patient. I asked if the patient was there now and she is so I will fax the pre-procedure instructions to [facility] for the patient. Permcath removal with Dr. Lucky Cowboy on 10/27/20 with a 11:15 am arrival time to the [facility]. Covid testing on 10/25/20 between 8-1 pm at the [facility].

## 2020-10-20 ENCOUNTER — Encounter: Payer: Self-pay | Admitting: General Practice

## 2020-10-25 ENCOUNTER — Other Ambulatory Visit: Payer: Self-pay | Admitting: Nephrology

## 2020-10-25 ENCOUNTER — Other Ambulatory Visit: Admission: RE | Admit: 2020-10-25 | Payer: Medicare Other | Source: Ambulatory Visit

## 2020-10-25 DIAGNOSIS — R188 Other ascites: Secondary | ICD-10-CM

## 2020-10-26 ENCOUNTER — Other Ambulatory Visit (INDEPENDENT_AMBULATORY_CARE_PROVIDER_SITE_OTHER): Payer: Self-pay | Admitting: Nurse Practitioner

## 2020-10-27 ENCOUNTER — Ambulatory Visit: Payer: Medicare Other | Attending: Nephrology

## 2020-10-27 ENCOUNTER — Ambulatory Visit: Admission: RE | Admit: 2020-10-27 | Payer: Medicare Other | Source: Home / Self Care | Admitting: Vascular Surgery

## 2020-10-27 ENCOUNTER — Encounter: Admission: RE | Payer: Self-pay | Source: Home / Self Care

## 2020-10-27 DIAGNOSIS — N186 End stage renal disease: Secondary | ICD-10-CM

## 2020-10-27 SURGERY — DIALYSIS/PERMA CATHETER REMOVAL
Anesthesia: LOCAL

## 2020-11-02 ENCOUNTER — Other Ambulatory Visit: Payer: Self-pay

## 2020-11-02 ENCOUNTER — Emergency Department: Payer: Medicare Other

## 2020-11-02 ENCOUNTER — Inpatient Hospital Stay
Admission: EM | Admit: 2020-11-02 | Discharge: 2020-11-05 | DRG: 871 | Discharge status: Against Medical Advice | Payer: Medicare Other | Attending: Internal Medicine | Admitting: Internal Medicine

## 2020-11-02 DIAGNOSIS — I10 Essential (primary) hypertension: Secondary | ICD-10-CM

## 2020-11-02 DIAGNOSIS — E8809 Other disorders of plasma-protein metabolism, not elsewhere classified: Secondary | ICD-10-CM | POA: Diagnosis present

## 2020-11-02 DIAGNOSIS — I132 Hypertensive heart and chronic kidney disease with heart failure and with stage 5 chronic kidney disease, or end stage renal disease: Secondary | ICD-10-CM | POA: Diagnosis present

## 2020-11-02 DIAGNOSIS — Z9114 Patient's other noncompliance with medication regimen: Secondary | ICD-10-CM

## 2020-11-02 DIAGNOSIS — R7989 Other specified abnormal findings of blood chemistry: Secondary | ICD-10-CM

## 2020-11-02 DIAGNOSIS — N189 Chronic kidney disease, unspecified: Secondary | ICD-10-CM | POA: Diagnosis present

## 2020-11-02 DIAGNOSIS — A498 Other bacterial infections of unspecified site: Secondary | ICD-10-CM

## 2020-11-02 DIAGNOSIS — Y832 Surgical operation with anastomosis, bypass or graft as the cause of abnormal reaction of the patient, or of later complication, without mention of misadventure at the time of the procedure: Secondary | ICD-10-CM | POA: Diagnosis present

## 2020-11-02 DIAGNOSIS — L988 Other specified disorders of the skin and subcutaneous tissue: Secondary | ICD-10-CM

## 2020-11-02 DIAGNOSIS — R52 Pain, unspecified: Secondary | ICD-10-CM

## 2020-11-02 DIAGNOSIS — M79622 Pain in left upper arm: Secondary | ICD-10-CM

## 2020-11-02 DIAGNOSIS — R799 Abnormal finding of blood chemistry, unspecified: Secondary | ICD-10-CM

## 2020-11-02 DIAGNOSIS — K219 Gastro-esophageal reflux disease without esophagitis: Secondary | ICD-10-CM

## 2020-11-02 DIAGNOSIS — E8729 Other acidosis: Secondary | ICD-10-CM

## 2020-11-02 DIAGNOSIS — R188 Other ascites: Secondary | ICD-10-CM | POA: Diagnosis present

## 2020-11-02 DIAGNOSIS — E11649 Type 2 diabetes mellitus with hypoglycemia without coma: Secondary | ICD-10-CM | POA: Diagnosis present

## 2020-11-02 DIAGNOSIS — Z992 Dependence on renal dialysis: Secondary | ICD-10-CM

## 2020-11-02 DIAGNOSIS — A419 Sepsis, unspecified organism: Secondary | ICD-10-CM

## 2020-11-02 DIAGNOSIS — T82848A Pain from vascular prosthetic devices, implants and grafts, initial encounter: Secondary | ICD-10-CM | POA: Diagnosis present

## 2020-11-02 DIAGNOSIS — Z833 Family history of diabetes mellitus: Secondary | ICD-10-CM

## 2020-11-02 DIAGNOSIS — R1011 Right upper quadrant pain: Secondary | ICD-10-CM | POA: Diagnosis not present

## 2020-11-02 DIAGNOSIS — A4181 Sepsis due to Enterococcus: Secondary | ICD-10-CM | POA: Diagnosis not present

## 2020-11-02 DIAGNOSIS — E872 Acidosis, unspecified: Secondary | ICD-10-CM

## 2020-11-02 DIAGNOSIS — K7460 Unspecified cirrhosis of liver: Secondary | ICD-10-CM | POA: Diagnosis present

## 2020-11-02 DIAGNOSIS — K529 Noninfective gastroenteritis and colitis, unspecified: Secondary | ICD-10-CM | POA: Diagnosis present

## 2020-11-02 DIAGNOSIS — I2510 Atherosclerotic heart disease of native coronary artery without angina pectoris: Secondary | ICD-10-CM | POA: Diagnosis present

## 2020-11-02 DIAGNOSIS — Z720 Tobacco use: Secondary | ICD-10-CM | POA: Diagnosis present

## 2020-11-02 DIAGNOSIS — K649 Unspecified hemorrhoids: Secondary | ICD-10-CM | POA: Diagnosis present

## 2020-11-02 DIAGNOSIS — F419 Anxiety disorder, unspecified: Secondary | ICD-10-CM

## 2020-11-02 DIAGNOSIS — R197 Diarrhea, unspecified: Secondary | ICD-10-CM

## 2020-11-02 DIAGNOSIS — E1122 Type 2 diabetes mellitus with diabetic chronic kidney disease: Secondary | ICD-10-CM | POA: Diagnosis present

## 2020-11-02 DIAGNOSIS — E8770 Fluid overload, unspecified: Secondary | ICD-10-CM

## 2020-11-02 DIAGNOSIS — D631 Anemia in chronic kidney disease: Secondary | ICD-10-CM | POA: Diagnosis present

## 2020-11-02 DIAGNOSIS — E876 Hypokalemia: Secondary | ICD-10-CM | POA: Diagnosis present

## 2020-11-02 DIAGNOSIS — Z79899 Other long term (current) drug therapy: Secondary | ICD-10-CM

## 2020-11-02 DIAGNOSIS — Z5329 Procedure and treatment not carried out because of patient's decision for other reasons: Secondary | ICD-10-CM | POA: Diagnosis present

## 2020-11-02 DIAGNOSIS — Z20822 Contact with and (suspected) exposure to covid-19: Secondary | ICD-10-CM | POA: Diagnosis present

## 2020-11-02 DIAGNOSIS — F418 Other specified anxiety disorders: Secondary | ICD-10-CM | POA: Diagnosis present

## 2020-11-02 DIAGNOSIS — E875 Hyperkalemia: Secondary | ICD-10-CM | POA: Diagnosis present

## 2020-11-02 DIAGNOSIS — F17210 Nicotine dependence, cigarettes, uncomplicated: Secondary | ICD-10-CM | POA: Diagnosis present

## 2020-11-02 DIAGNOSIS — H938X2 Other specified disorders of left ear: Secondary | ICD-10-CM | POA: Diagnosis present

## 2020-11-02 DIAGNOSIS — N2581 Secondary hyperparathyroidism of renal origin: Secondary | ICD-10-CM | POA: Diagnosis present

## 2020-11-02 DIAGNOSIS — B952 Enterococcus as the cause of diseases classified elsewhere: Secondary | ICD-10-CM

## 2020-11-02 DIAGNOSIS — N186 End stage renal disease: Secondary | ICD-10-CM

## 2020-11-02 DIAGNOSIS — J449 Chronic obstructive pulmonary disease, unspecified: Secondary | ICD-10-CM | POA: Diagnosis present

## 2020-11-02 DIAGNOSIS — I5032 Chronic diastolic (congestive) heart failure: Secondary | ICD-10-CM | POA: Diagnosis present

## 2020-11-02 DIAGNOSIS — F32A Depression, unspecified: Secondary | ICD-10-CM

## 2020-11-02 DIAGNOSIS — D72829 Elevated white blood cell count, unspecified: Secondary | ICD-10-CM

## 2020-11-02 DIAGNOSIS — I129 Hypertensive chronic kidney disease with stage 1 through stage 4 chronic kidney disease, or unspecified chronic kidney disease: Secondary | ICD-10-CM | POA: Diagnosis present

## 2020-11-02 DIAGNOSIS — E559 Vitamin D deficiency, unspecified: Secondary | ICD-10-CM | POA: Diagnosis present

## 2020-11-02 LAB — HEPATIC FUNCTION PANEL
ALT: 50 U/L — ABNORMAL HIGH (ref 0–44)
AST: 74 U/L — ABNORMAL HIGH (ref 15–41)
Albumin: 3.4 g/dL — ABNORMAL LOW (ref 3.5–5.0)
Alkaline Phosphatase: 146 U/L — ABNORMAL HIGH (ref 38–126)
Bilirubin, Direct: 0.3 mg/dL — ABNORMAL HIGH (ref 0.0–0.2)
Indirect Bilirubin: 1 mg/dL — ABNORMAL HIGH (ref 0.3–0.9)
Total Bilirubin: 1.3 mg/dL — ABNORMAL HIGH (ref 0.3–1.2)
Total Protein: 8.1 g/dL (ref 6.5–8.1)

## 2020-11-02 LAB — BRAIN NATRIURETIC PEPTIDE: B Natriuretic Peptide: >4500 pg/mL — ABNORMAL HIGH (ref 0.0–100.0)

## 2020-11-02 LAB — CBC
HCT: 30.4 % — ABNORMAL LOW (ref 36.0–46.0)
Hemoglobin: 9.6 g/dL — ABNORMAL LOW (ref 12.0–15.0)
MCH: 26.4 pg (ref 26.0–34.0)
MCHC: 31.6 g/dL (ref 30.0–36.0)
MCV: 83.7 fL (ref 80.0–100.0)
Platelets: 175 10*3/uL (ref 150–400)
RBC: 3.63 MIL/uL — ABNORMAL LOW (ref 3.87–5.11)
RDW: 15.1 % (ref 11.5–15.5)
WBC: 11.6 10*3/uL — ABNORMAL HIGH (ref 4.0–10.5)
nRBC: 0 % (ref 0.0–0.2)

## 2020-11-02 LAB — BASIC METABOLIC PANEL
Anion gap: 20 — ABNORMAL HIGH (ref 5–15)
BUN: 54 mg/dL — ABNORMAL HIGH (ref 6–20)
CO2: 21 mmol/L — ABNORMAL LOW (ref 22–32)
Calcium: 9.2 mg/dL (ref 8.9–10.3)
Chloride: 90 mmol/L — ABNORMAL LOW (ref 98–111)
Creatinine, Ser: 7.31 mg/dL — ABNORMAL HIGH (ref 0.44–1.00)
GFR, Estimated: 6 mL/min — ABNORMAL LOW (ref >60)
Glucose, Bld: 98 mg/dL (ref 70–99)
Potassium: 5.3 mmol/L — ABNORMAL HIGH (ref 3.5–5.1)
Sodium: 131 mmol/L — ABNORMAL LOW (ref 135–145)

## 2020-11-02 LAB — TROPONIN I (HIGH SENSITIVITY)
Troponin I (High Sensitivity): 15 ng/L (ref <18)
Troponin I (High Sensitivity): 14 ng/L (ref <18)

## 2020-11-02 LAB — RESPIRATORY PANEL BY RT PCR (FLU A&B, COVID)
Influenza A by PCR: NEGATIVE
Influenza B by PCR: NEGATIVE
SARS Coronavirus 2 by RT PCR: NEGATIVE

## 2020-11-02 LAB — PROTIME-INR
INR: 1.5 — ABNORMAL HIGH (ref 0.8–1.2)
Prothrombin Time: 17.2 seconds — ABNORMAL HIGH (ref 11.4–15.2)

## 2020-11-02 LAB — CBG MONITORING, ED
Glucose-Capillary: 60 mg/dL — ABNORMAL LOW (ref 70–99)
Glucose-Capillary: 80 mg/dL (ref 70–99)

## 2020-11-02 LAB — LACTIC ACID, PLASMA
Lactic Acid, Venous: 3.1 mmol/L (ref 0.5–1.9)
Lactic Acid, Venous: 1.9 mmol/L (ref 0.5–1.9)

## 2020-11-02 LAB — TSH: TSH: 1.406 u[IU]/mL (ref 0.350–4.500)

## 2020-11-02 LAB — PROCALCITONIN: Procalcitonin: 53.11 ng/mL

## 2020-11-02 LAB — APTT: aPTT: 38 seconds — ABNORMAL HIGH (ref 24–36)

## 2020-11-02 LAB — MAGNESIUM: Magnesium: 1.6 mg/dL — ABNORMAL LOW (ref 1.7–2.4)

## 2020-11-02 MED ORDER — DOCUSATE SODIUM 100 MG PO CAPS: 100.0000 mg | ORAL_CAPSULE | Freq: Every day | ORAL | Status: DC | PRN

## 2020-11-02 MED ORDER — SODIUM CHLORIDE 0.9% FLUSH
3.0000 mL | Freq: Two times a day (BID) | INTRAVENOUS | Status: DC
  Administered 2020-11-02 – 2020-11-05 (×5): 3 mL via INTRAVENOUS

## 2020-11-02 MED ORDER — RENA-VITE PO TABS
1.0000 | ORAL_TABLET | Freq: Every day | ORAL | Status: DC
  Administered 2020-11-03 – 2020-11-05 (×2): 1 via ORAL
  Filled 2020-11-02 (×4): qty 1

## 2020-11-02 MED ORDER — TORSEMIDE 20 MG PO TABS
100.0000 mg | ORAL_TABLET | Freq: Every day | ORAL | Status: DC
  Administered 2020-11-03 – 2020-11-05 (×2): 100 mg via ORAL
  Filled 2020-11-02: qty 1
  Filled 2020-11-02: qty 5
  Filled 2020-11-02: qty 1

## 2020-11-02 MED ORDER — QUETIAPINE FUMARATE 25 MG PO TABS
25.0000 mg | ORAL_TABLET | Freq: Every evening | ORAL | Status: DC | PRN
  Administered 2020-11-03 – 2020-11-04 (×3): 25 mg via ORAL
  Filled 2020-11-02 (×3): qty 1

## 2020-11-02 MED ORDER — MOMETASONE FURO-FORMOTEROL FUM 200-5 MCG/ACT IN AERO
2.0000 | INHALATION_SPRAY | Freq: Two times a day (BID) | RESPIRATORY_TRACT | Status: DC
  Administered 2020-11-02 – 2020-11-05 (×5): 2 via RESPIRATORY_TRACT
  Filled 2020-11-02: qty 8.8

## 2020-11-02 MED ORDER — ALBUMIN HUMAN 25 % IV SOLN
25.0000 g | Freq: Once | INTRAVENOUS | Status: DC | PRN
  Filled 2020-11-02: qty 100

## 2020-11-02 MED ORDER — AMLODIPINE BESYLATE 10 MG PO TABS
10.0000 mg | ORAL_TABLET | Freq: Every day | ORAL | Status: DC
  Administered 2020-11-05: 10 mg via ORAL
  Filled 2020-11-02 (×2): qty 1
  Filled 2020-11-02: qty 2

## 2020-11-02 MED ORDER — BENZONATATE 100 MG PO CAPS: 100.0000 mg | ORAL_CAPSULE | Freq: Two times a day (BID) | ORAL | Status: DC | PRN

## 2020-11-02 MED ORDER — SODIUM CHLORIDE 0.9 % IV SOLN
2.0000 g | Freq: Once | INTRAVENOUS | Status: AC
  Administered 2020-11-02: 2 g via INTRAVENOUS
  Filled 2020-11-02: qty 2

## 2020-11-02 MED ORDER — SODIUM CHLORIDE 0.9% FLUSH: 3.0000 mL | INTRAVENOUS | Status: DC | PRN

## 2020-11-02 MED ORDER — VITAMIN D (ERGOCALCIFEROL) 1.25 MG (50000 UNIT) PO CAPS: 50000.0000 [IU] | ORAL_CAPSULE | ORAL | Status: DC

## 2020-11-02 MED ORDER — SODIUM CHLORIDE 0.9 % IV SOLN: 500.0000 mg | Freq: Once | INTRAVENOUS | Status: DC

## 2020-11-02 MED ORDER — DULOXETINE HCL 60 MG PO CPEP
60.0000 mg | ORAL_CAPSULE | Freq: Two times a day (BID) | ORAL | Status: DC
  Administered 2020-11-02 – 2020-11-05 (×5): 60 mg via ORAL
  Filled 2020-11-02 (×7): qty 1

## 2020-11-02 MED ORDER — PROPRANOLOL HCL 20 MG PO TABS: 40.0000 mg | ORAL_TABLET | Freq: Every day | ORAL | Status: DC

## 2020-11-02 MED ORDER — ALBUTEROL SULFATE (2.5 MG/3ML) 0.083% IN NEBU: 2.5000 mg | INHALATION_SOLUTION | Freq: Four times a day (QID) | RESPIRATORY_TRACT | Status: DC | PRN

## 2020-11-02 MED ORDER — HYDROXYZINE HCL 25 MG PO TABS
25.0000 mg | ORAL_TABLET | Freq: Three times a day (TID) | ORAL | Status: DC | PRN
  Administered 2020-11-03 – 2020-11-04 (×3): 25 mg via ORAL
  Filled 2020-11-02 (×5): qty 1

## 2020-11-02 MED ORDER — MAGNESIUM SULFATE 2 GM/50ML IV SOLN
2.0000 g | Freq: Once | INTRAVENOUS | Status: AC
  Administered 2020-11-02: 2 g via INTRAVENOUS
  Filled 2020-11-02: qty 50

## 2020-11-02 MED ORDER — ALBUTEROL SULFATE HFA 108 (90 BASE) MCG/ACT IN AERS
2.0000 | INHALATION_SPRAY | Freq: Four times a day (QID) | RESPIRATORY_TRACT | Status: DC | PRN
  Filled 2020-11-02: qty 6.7

## 2020-11-02 MED ORDER — METOPROLOL TARTRATE 50 MG PO TABS
50.0000 mg | ORAL_TABLET | Freq: Two times a day (BID) | ORAL | Status: DC
  Administered 2020-11-02 – 2020-11-05 (×4): 50 mg via ORAL
  Filled 2020-11-02 (×4): qty 1

## 2020-11-02 MED ORDER — VANCOMYCIN HCL IN DEXTROSE 1-5 GM/200ML-% IV SOLN
1000.0000 mg | Freq: Once | INTRAVENOUS | Status: DC
  Filled 2020-11-02: qty 200

## 2020-11-02 MED ORDER — OXYCODONE HCL 5 MG PO TABS
5.0000 mg | ORAL_TABLET | Freq: Four times a day (QID) | ORAL | Status: DC | PRN
  Administered 2020-11-02 – 2020-11-04 (×7): 5 mg via ORAL
  Filled 2020-11-02 (×7): qty 1

## 2020-11-02 MED ORDER — SODIUM CHLORIDE 0.9 % IV SOLN: 250.0000 mL | INTRAVENOUS | Status: DC | PRN

## 2020-11-02 MED ORDER — HEPARIN SODIUM (PORCINE) 5000 UNIT/ML IJ SOLN
5000.0000 [IU] | Freq: Three times a day (TID) | INTRAMUSCULAR | Status: DC
  Administered 2020-11-02 – 2020-11-05 (×6): 5000 [IU] via SUBCUTANEOUS
  Filled 2020-11-02 (×8): qty 1

## 2020-11-02 MED ORDER — PANTOPRAZOLE SODIUM 40 MG PO TBEC
40.0000 mg | DELAYED_RELEASE_TABLET | Freq: Every day | ORAL | Status: DC
  Administered 2020-11-03 – 2020-11-05 (×2): 40 mg via ORAL
  Filled 2020-11-02 (×2): qty 1

## 2020-11-02 NOTE — H&P (Addendum)
History and Physical   Janet Olson:[id number] DOB: 1970-09-16 DOA: 11/02/2020  PCP: Remi Haggard, FNP  Patient coming from: Dialysis center  I have personally briefly reviewed patient's old medical records in [place].  Chief Concern: Chills and pain in her extremities.  HPI: Janet Olson is a 50 y.o. female with medical history significant for hypertension, chronic daily tobacco use, end-stage renal disease on hemodialysis, anxiety/depression, chronic daily diarrhea, presents to the emergency department via EMS for nonspecific concerns including numbness in extremities and pain, chills.  She reports the symptoms started during dialysis.  She denies having the symptoms before.  She denies fever, cough, chest pain, shortness of breath, dysuria, hematuria, vomiting, headache, vision changes, changes to her bowel movements.  She endorses nausea that is frequent and common for her, and right upper quadrant abdominal pain with diffuse pain throughout her abdomen.  She endorses distention in her belly.  She states she gets monthly ultrasound paracentesis for ascites.  Social history: Lives at home with her son.  Endorses daily tobacco use, 1 pack/day, started in her teens, EtOH use infrequent (2-3 drinks), last drink 5 days ago, denies recreational drug use  ED Course: Discussed with ED provider.  Elevated lactic acid, broad-spectrum antibiotics have been started.  Chest x-ray ordered showing vascular congestion and mild interstitial edema.  Review of Systems: As per HPI otherwise 10 point review of systems negative.  Past Medical History:  Diagnosis Date  . (HFpEF) heart failure with preserved ejection fraction ([place])    a. 2015 Echo: Nl EF. Mild to mod LVH. Mild PR/MR; b. 04/2017 Echo: EF 55-60%, no rwma, Gr1 DD. Mildly dil LA. Small effusion; c. 12/2018 Echo: EF 55-60%, mildly dil LA/PA. Mod dil RA. Mild to mod RV dil. Mildly reduced RV fxn. Mild TR. Mod elev PASP.  Marland Kitchen  Abdominal ascites   . Acute hypoxemic respiratory failure ([place]) 01/03/2019  . Acute renal failure (ARF) ([place]) 05/17/2017  . Acute respiratory failure ([place]) 09/25/2018  . Anemia   . Anxiety   . Arthritis   . Asthma   . Cardiomegaly   . COPD (chronic obstructive pulmonary disease) ([place])   . Depression   . Dialysis patient ([place])    MON, WED , FRI  . Dyspnea   . ESRD (end stage renal disease) ([place])   . GERD (gastroesophageal reflux disease)   . Headache    Migraines  . Hypertension   . Nonrheumatic tricuspid (valve) insufficiency   . Respiratory failure with hypoxia ([place]) 04/21/2018  . Ulcer    gastric  . Vitamin D deficiency    Past Surgical History:  Procedure Laterality Date  . A/V FISTULAGRAM Left 04/17/2018   Procedure: A/V FISTULAGRAM;  Surgeon: Algernon Huxley, MD;  Location: [facility];  Service: Cardiovascular;  Laterality: Left;  . A/V FISTULAGRAM Left 05/27/2018   Procedure: A/V FISTULAGRAM;  Surgeon: Katha Cabal, MD;  Location: [facility];  Service: Cardiovascular;  Laterality: Left;  . A/V FISTULAGRAM Left 07/05/2020   Procedure: A/V FISTULAGRAM;  Surgeon: Katha Cabal, MD;  Location: [facility];  Service: Cardiovascular;  Laterality: Left;  . AV FISTULA PLACEMENT Left 08/08/2017   Procedure: ARTERIOVENOUS (AV) FISTULA CREATION ( BRACHIOCEPHALIC );  Surgeon: Algernon Huxley, MD;  Location: [facility];  Service: Vascular;  Laterality: Left;  . AV FISTULA PLACEMENT Left 08/05/2020   Procedure: INSERTION OF ARTERIOVENOUS (AV) GRAFT ARM(BRACHIAL AXILLARY);  Surgeon: Katha Cabal, MD;  Location: [facility];  Service: Vascular;  Laterality: Left;  . CESAREAN SECTION     x 4 (1988, 1991, 1997, 2000 )  . COLONOSCOPY WITH PROPOFOL N/A 04/24/2018   Procedure: COLONOSCOPY WITH PROPOFOL;  Surgeon: Lin Landsman, MD;  Location: [facility];  Service: Gastroenterology;  Laterality: N/A;  . DIALYSIS/PERMA CATHETER INSERTION N/A 07/16/2017    Procedure: Dialysis/Perma Catheter Insertion;  Surgeon: Katha Cabal, MD;  Location: [facility];  Service: Cardiovascular;  Laterality: N/A;  . DIALYSIS/PERMA CATHETER REMOVAL N/A 10/29/2017   Procedure: DIALYSIS/PERMA CATHETER REMOVAL;  Surgeon: Katha Cabal, MD;  Location: [facility];  Service: Cardiovascular;  Laterality: N/A;  . excision of lymph nodes Bilateral 2014   bilateral under arms.  Marland Kitchen PARATHYROID IMPLANT REMOVAL    . REMOVAL OF A DIALYSIS CATHETER Left 08/05/2020   Procedure: REMOVAL OF A DIALYSIS CATHETER ( FISTULA );  Surgeon: Katha Cabal, MD;  Location: [facility];  Service: Vascular;  Laterality: Left;   Social History:  reports that she has been smoking cigarettes. She has a 15.00 pack-year smoking history. She has never used smokeless tobacco. She reports that she does not drink alcohol and does not use drugs.  No Known Allergies  Family History  Problem Relation Age of Onset  . Diabetes Mother    Family history: Family history reviewed and diabetes in the mother.  Multiple cousins with end-stage renal disease on hemodialysis. No knowledge of family history of cancer  Prior to Admission medications   Medication Sig Start Date End Date Taking? Authorizing Provider  albuterol (VENTOLIN HFA) 108 (90 Base) MCG/ACT inhaler Inhale 2 puffs into the lungs every 6 (six) hours as needed for wheezing or shortness of breath.  12/22/19  Yes [provider]  amLODipine (NORVASC) 10 MG tablet Take 1 tablet (10 mg total) by mouth daily. 06/12/17  Yes Doles-Johnson, Teah, NP  calcium acetate (PHOSLO) 667 MG capsule Take 2 capsules (1,334 mg total) by mouth 3 (three) times daily with meals. 09/27/18  Yes Wieting, Richard, MD  DULERA 200-5 MCG/ACT AERO Inhale 2 puffs into the lungs 2 (two) times daily. 01/16/18  Yes [provider]  DULoxetine (CYMBALTA) 60 MG capsule Take 60 mg by mouth 2 (two) times daily.  12/05/17  Yes [provider]  hydrOXYzine (ATARAX/VISTARIL) 25 MG tablet Take 25 mg by mouth every 8 (eight) hours as needed for itching.  12/21/19  Yes [provider]  metoprolol tartrate (LOPRESSOR) 50 MG tablet Take 50 mg by mouth 2 (two) times daily. 02/09/20  Yes [provider]  multivitamin (RENA-VIT) TABS tablet Take 1 tablet by mouth daily.  08/23/17  Yes [provider]  pantoprazole (PROTONIX) 40 MG tablet Take 1 tablet (40 mg total) by mouth daily. 12/20/18  Yes Schuyler Amor, MD  QUEtiapine (SEROQUEL) 25 MG tablet Take 25 mg by mouth at bedtime as needed (sleep).  05/24/20  Yes [provider]  torsemide (DEMADEX) 100 MG tablet Take 1 tablet (100 mg total) by mouth daily. 09/27/18  Yes Wieting, Richard, MD  albuterol (PROVENTIL) (2.5 MG/3ML) 0.083% nebulizer solution Take 2.5 mg by nebulization every 6 (six) hours as needed for wheezing or shortness of breath.     [provider]  benzonatate (TESSALON) 100 MG capsule Take 100 mg by mouth 2 (two) times daily as needed for cough.  08/03/19   [provider]  chlorpheniramine-HYDROcodone (TUSSIONEX) 10-8 MG/5ML SUER Take 5 mLs by mouth daily  as needed for cough.  Patient not taking: Reported on 11/02/2020    [provider]  docusate sodium (COLACE) 100 MG capsule Take 100 mg by mouth daily as needed for moderate constipation.     [provider]  Melatonin 10 MG CAPS Take 10 mg by mouth at bedtime as needed (sleep).     [provider]  oxyCODONE (OXY IR/ROXICODONE) 5 MG immediate release tablet Take 1 tablet (5 mg total) by mouth every 6 (six) hours as needed for moderate pain or severe pain. 08/25/20   Schnier, Dolores Lory, MD  oxyCODONE-acetaminophen (PERCOCET) 5-325 MG tablet Take 1-2 tablets by mouth every 6 (six) hours as needed for moderate pain or severe pain. Patient not taking: Reported on 08/25/2020 08/05/20 08/05/21  Schnier, Dolores Lory, MD  propranolol (INDERAL) 40 MG  tablet Take 40 mg by mouth daily. Patient not taking: Reported on 11/02/2020 08/22/20   [provider]  Vitamin D, Ergocalciferol, (DRISDOL) 50000 units CAPS capsule Take 50,000 Units by mouth every Sunday.  10/22/17   [provider]   Physical Exam: Vitals:   11/02/20 1610 11/02/20 1630 11/02/20 1700 11/02/20 1730  BP:  136/78 127/79 126/71  Pulse:  87  82  Resp:      Temp:      TempSrc:      SpO2: 97%   95%  Weight:      Height:       Constitutional: NAD, calm, comfortable Eyes: PERRL, lids and conjunctivae normal ENMT: 5 mm mass with minimal bleeding in left ear, Mucous membranes are moist. Posterior pharynx clear of any exudate or lesions. Normal dentition.  Neck: normal, supple, no masses, no thyromegaly Respiratory: clear to auscultation bilaterally, no wheezing, no crackles. Normal respiratory effort. No accessory muscle use.  Cardiovascular: Regular rate and rhythm; murmurs heard, no rubs/gallops. No extremity edema. 2+ pedal pulses. No carotid bruits.  Abdomen: Obese abdomen, right upper quadrant tenderness, no masses palpated. No hepatosplenomegaly. Bowel sounds positive.  Musculoskeletal: no clubbing / cyanosis. No joint deformity upper and lower extremities. Good ROM, no contractures. Normal muscle tone.  Skin: no rashes, lesions, ulcers. No induration Neurologic: Sensation intact. Strength 5/5 in all 4.  Psychiatric: Normal judgment and insight. Alert and oriented x 3. Normal mood.   Labs on Admission: I have personally reviewed following labs and imaging studies  CBC: Recent Labs  Lab 11/02/20 1230  WBC 11.6*  HGB 9.6*  HCT 30.4*  MCV 83.7  PLT 546   Basic Metabolic Panel: Recent Labs  Lab 11/02/20 1230  NA 131*  K 5.3*  CL 90*  CO2 21*  GLUCOSE 98  BUN 54*  CREATININE 7.31*  CALCIUM 9.2  MG 1.6*   GFR: Estimated Creatinine Clearance: 9.7 mL/min (A) (by C-G formula based on SCr of 7.31 mg/dL (H)).  Liver Function Tests: Recent  Labs  Lab 11/02/20 1236  AST 74*  ALT 50*  ALKPHOS 146*  BILITOT 1.3*  PROT 8.1  ALBUMIN 3.4*   CBG: Recent Labs  Lab 11/02/20 1555 11/02/20 1631  GLUCAP 60* 80   Lipid Profile: No results for input(s): CHOL, HDL, LDLCALC, TRIG, CHOLHDL, LDLDIRECT in the last 72 hours. Thyroid Function Tests: Recent Labs    11/02/20 1237  TSH 1.406   Anemia Panel: No results for input(s): VITAMINB12, FOLATE, FERRITIN, TIBC, IRON, RETICCTPCT in the last 72 hours.  Urine analysis:    Component Value Date/Time   COLORURINE AMBER (A) 02/21/2018 2232   APPEARANCEUR CLOUDY (  A) 02/21/2018 2232   LABSPEC 1.029 02/21/2018 2232   PHURINE 6.0 02/21/2018 2232   GLUCOSEU 150 (A) 02/21/2018 2232   HGBUR NEGATIVE 02/21/2018 2232   BILIRUBINUR NEGATIVE 02/21/2018 2232   KETONESUR NEGATIVE 02/21/2018 2232   PROTEINUR 100 (A) 02/21/2018 2232   NITRITE NEGATIVE 02/21/2018 2232   LEUKOCYTESUR NEGATIVE 02/21/2018 2232   Radiological Exams on Admission: Personally reviewed and I agree with radiologist reading as below.  DG Chest 1 View  Result Date: 11/02/2020 CLINICAL DATA:  Chills during dialysis EXAM: CHEST  1 VIEW COMPARISON:  03/03/2020 FINDINGS: Single frontal view of the chest demonstrates right internal jugular dialysis catheter tip overlying superior vena cava. Cardiac silhouette remains enlarged. There is central vascular congestion with mild diffuse interstitial prominence. No airspace disease, effusion, or pneumothorax. No acute bony abnormality. IMPRESSION: 1. Vascular congestion and mild interstitial edema. 2. Stable enlarged cardiac silhouette. Electronically Signed   By: Randa Ngo M.D.   On: 11/02/2020 15:49   EKG: Independently reviewed, showing   Assessment/Plan  Principal Problem:   Right upper quadrant abdominal pain Active Problems:   Anemia in chronic kidney disease   COPD (chronic obstructive pulmonary disease) (HCC)   ESRD on dialysis (HCC)   Hyperkalemia   Abdominal  ascites   Tobacco use   Leukocytosis Sepsis-low clinical suspicion however in setting of ascites and leukocytosis, we will treat with ABX and obtain cultures via paracentesis  Right upper quadrant abdominal pain-differential includes cholecystitis versus neurogenic pain from abdominal distention, versus SBP (less likely) -Abdominal ultrasound has been ordered to assess for cholelithiasis -Ultrasound-guided paracentesis have been ordered -Cultures ordered -Albumin as needed ordered for hypotension -Patient received antibiotics in the ED per ED provider for elevated lactic acid  End-stage renal disease on hemodialysis Monday Wednesday Friday-ED provider has called nephrology on nephrologist will get patient into dialysis -Continue torsemide 100 mg daily  Normocytic anemia suspect of chronic disease-at baseline  Hypertension-controlled resumed home amlodipine 10 mg daily, metoprolol tartrate 50 mg twice daily, torsemide 100 mg daily  Anxiety/depression-resume home Cymbalta 60 mg p.o. twice daily, hydroxyzine 25 mg p.o. every 8 hours as needed for itching and anxiety, quetiapine 25 mg p.o. nightly  GERD-Protonix 40 mg daily  Tobacco use-at least 30-pack-year, extensive counseling has been given for patient to stop tobacco use -Slow titration, 19/day cigarettes during week 1, 18 cigarettes/day during week 2, 17 cigarettes/day during work week 3, etc. -Leaving cigarettes outside and lighter outside in separate places -Only smoking outside  Left ear canal growth - 4-5 mm irregularly shaped, with minimal traces of dried blood - Denies weight loss, endorses occasional bleeding from ear - No actively bleeding at this time - Follow-up outpatient for referral to ENT indicated   DVT prophylaxis: Heparin Code Status: Full code Diet: Heart healthy/carb modified; patient declines renal diet Family Communication: None at this time Disposition Plan: Ending clinical course Consults called:  Nephrology Admission status: Observation  Andreea Arca N Tineka Uriegas D.O. [facility]  If 7AM-7PM, please contact day-coverage provider [email or web address]  11/02/2020, 6:03 PM

## 2020-11-02 NOTE — Progress Notes (Signed)
Received patient into room 143. Vital signs taken. Skin checked. Patient oriented to room. Report received from [place], [place].N.

## 2020-11-02 NOTE — Progress Notes (Signed)
CODE SEPSIS - PHARMACY COMMUNICATION  **Broad Spectrum Antibiotics should be administered within 1 hour of Sepsis diagnosis**  Time Code Sepsis Called/Page Received: 1648  Antibiotics Ordered: Cefepime, Vanc, Azithro  Time of 1st antibiotic administration: 1751  Sepsis progress note per Clarise Cruz, RN, stating that bedside nurse was notified to hold abx until BCx drawn.  Renda Rolls, PharmD, [facility] 11/02/2020 5:55 PM

## 2020-11-02 NOTE — ED Notes (Signed)
Tamala Julian, MD aware of blood sugar. Patient given 8 oz of blood sugar.

## 2020-11-02 NOTE — Progress Notes (Signed)
Patient received from the ED for HD to be completed. Patient alert and oriented x 3. Patient with c/o having head/body pain rated 8 out of 10. Writer offered tylenol with patient refusing stating, " I need something stronger." Dr. Juleen China made aware during his roundings no new orders at this time. Treatment started without any difficultues or concerns using patients right CVC with MD being made aware and stating agreement. Patient request RN to utilize her CVC vs AVF. V/S WNL. BFR decreased to 371ml/min due to increased venous pressure >280. UF goal set to obtain 3L per MD orders. Oxgen via [place] at 2LPM increased to 3Lpm due to decrease O2 sats 87%, oxygen sats increased to 100%. Treatment continued. 2015: Patient continues to have c/o head pain. Writer contacted primary RN, Ria Comment, to inform of patient request for oxycodone, Primary RN to bring to HD unit.  2050: ED RN, Ria Comment called stating patient has been assigned to a room 143, when questioned if she would still bring patients pain medication, she responded, " I can't I have a whole assignment down here." Patient is sleeping at this time.

## 2020-11-02 NOTE — ED Notes (Signed)
Patient refused in and out cath. Tamala Julian, MD aware.

## 2020-11-02 NOTE — Progress Notes (Addendum)
[facility]  ROUNDING NOTE   Subjective:  Janet Olson is 50 years old female, known to our practice, has past medical history of diastolic heart failure,anemia, COPD,hypertension and ESRD on hemodialysis MWF. She presented to the ED with c/o chills and SOB x 1 day.She went to her dialysis center today,3 minutes into the treatment, she reported worsening chills and was sent to ED. We will plan for dialysis today.Orders placed.    Objective:  Vital signs in last 24 hours:  Temp:  [98.1 F (36.7 C)] 98.1 F (36.7 C) (11/03 1226) Pulse Rate:  [82-87] 82 (11/03 1730) Resp:  [18] 18 (11/03 1226) BP: (121-179)/(68-83) 126/71 (11/03 1730) SpO2:  [95 %-100 %] 95 % (11/03 1730) Weight:  [82.2 kg] 82.2 kg (11/03 1224)  Weight change:  Filed Weights   11/02/20 1224  Weight: 82.2 kg    Intake/Output: No intake/output data recorded.   Intake/Output this shift:  Total I/O In: 41 [IV Piggyback:41] Out: -   Physical Exam: General: No acute distress, lying in bed covered with blankets and jacket  Head: Normocephalic, atraumatic. Moist oral mucosal membranes  Eyes: Anicteric  Neck: trachea midline  Lungs:  Clear to auscultation bilaterally, Normal and symmetrical respiratory effort  Heart: Regular rate and rhythm  Abdomen:  Soft, nontender, mild distension +  Extremities:  No  peripheral edema.  Neurologic: Oriented x 3, moving all four extremities  Skin: No acute lesions or rashes  Access: LUA AVF +thrill,+bruit    Basic Metabolic Panel: Recent Labs  Lab 11/02/20 1230  NA 131*  K 5.3*  CL 90*  CO2 21*  GLUCOSE 98  BUN 54*  CREATININE 7.31*  CALCIUM 9.2  MG 1.6*    Liver Function Tests: Recent Labs  Lab 11/02/20 1236  AST 74*  ALT 50*  ALKPHOS 146*  BILITOT 1.3*  PROT 8.1  ALBUMIN 3.4*   No results for input(s): LIPASE, AMYLASE in the last 168 hours. No results for input(s): AMMONIA in the last 168 hours.  CBC: Recent Labs  Lab 11/02/20 1230   WBC 11.6*  HGB 9.6*  HCT 30.4*  MCV 83.7  PLT 175    Cardiac Enzymes: No results for input(s): CKTOTAL, CKMB, CKMBINDEX, TROPONINI in the last 168 hours.  BNP: Invalid input(s): POCBNP  CBG: Recent Labs  Lab 11/02/20 1555 11/02/20 1631  GLUCAP 60* 80    Microbiology: Results for orders placed or performed during the hospital encounter of 11/02/20  Respiratory Panel by RT PCR (Flu A&B, Covid) - Nasopharyngeal Swab     Status: None   Collection Time: 11/02/20  3:32 PM   Specimen: Nasopharyngeal Swab  Result Value Ref Range Status   SARS Coronavirus 2 by RT PCR NEGATIVE NEGATIVE Final    Comment: (NOTE) SARS-CoV-2 target nucleic acids are NOT DETECTED.  The SARS-CoV-2 RNA is generally detectable in upper respiratoy specimens during the acute phase of infection. The lowest concentration of SARS-CoV-2 viral copies this assay can detect is 131 copies/mL. A negative result does not preclude SARS-Cov-2 infection and should not be used as the sole basis for treatment or other patient management decisions. A negative result may occur with  improper specimen collection/handling, submission of specimen other than nasopharyngeal swab, presence of viral mutation(s) within the areas targeted by this assay, and inadequate number of viral copies (<131 copies/mL). A negative result must be combined with clinical observations, patient history, and epidemiological information. The expected result is Negative.  Fact Sheet for Patients:  PinkCheek.be  Fact Sheet for Healthcare Providers:  GravelBags.it  This test is no t yet approved or cleared by the [place] FDA and  has been authorized for detection and/or diagnosis of SARS-CoV-2 by FDA under an Emergency Use Authorization (EUA). This EUA will remain  in effect (meaning this test can be used) for the duration of the COVID-19 declaration under Section 564(b)(1) of the  Act, 21 U.S.C. section 360bbb-3(b)(1), unless the authorization is terminated or revoked sooner.     Influenza A by PCR NEGATIVE NEGATIVE Final   Influenza B by PCR NEGATIVE NEGATIVE Final    Comment: (NOTE) The Xpert Xpress SARS-CoV-2/FLU/RSV assay is intended as an aid in  the diagnosis of influenza from Nasopharyngeal swab specimens and  should not be used as a sole basis for treatment. Nasal washings and  aspirates are unacceptable for Xpert Xpress SARS-CoV-2/FLU/RSV  testing.  Fact Sheet for Patients: PinkCheek.be  Fact Sheet for Healthcare Providers: GravelBags.it  This test is not yet approved or cleared by the [place] FDA and  has been authorized for detection and/or diagnosis of SARS-CoV-2 by  FDA under an Emergency Use Authorization (EUA). This EUA will remain  in effect (meaning this test can be used) for the duration of the  Covid-19 declaration under Section 564(b)(1) of the Act, 21  U.S.C. section 360bbb-3(b)(1), unless the authorization is  terminated or revoked. Performed at [facility], [address] [place], [place] [address]     Coagulation Studies: Recent Labs    11/02/20 1644  LABPROT 17.2*  INR 1.5*    Urinalysis: No results for input(s): COLORURINE, LABSPEC, PHURINE, GLUCOSEU, HGBUR, BILIRUBINUR, KETONESUR, PROTEINUR, UROBILINOGEN, NITRITE, LEUKOCYTESUR in the last 72 hours.  Invalid input(s): APPERANCEUR    Imaging: DG Chest 1 View  Result Date: 11/02/2020 CLINICAL DATA:  Chills during dialysis EXAM: CHEST  1 VIEW COMPARISON:  03/03/2020 FINDINGS: Single frontal view of the chest demonstrates right internal jugular dialysis catheter tip overlying superior vena cava. Cardiac silhouette remains enlarged. There is central vascular congestion with mild diffuse interstitial prominence. No airspace disease, effusion, or pneumothorax. No acute bony abnormality. IMPRESSION: 1.  Vascular congestion and mild interstitial edema. 2. Stable enlarged cardiac silhouette. Electronically Signed   By: Randa Ngo M.D.   On: 11/02/2020 15:49     Medications:   . sodium chloride    . albumin human    . azithromycin    . ceFEPime (MAXIPIME) IV 2 g (11/02/20 1751)  . vancomycin     . amLODipine  10 mg Oral Daily  . DULoxetine  60 mg Oral BID  . heparin  5,000 Units Subcutaneous Q8H  . metoprolol tartrate  50 mg Oral BID  . mometasone-formoterol  2 puff Inhalation BID  . multivitamin  1 tablet Oral Daily  . pantoprazole  40 mg Oral Daily  . sodium chloride flush  3 mL Intravenous Q12H  . torsemide  100 mg Oral Daily  . [START ON 11/06/2020] Vitamin D (Ergocalciferol)  50,000 Units Oral Q Sun   sodium chloride, albumin human, albuterol, albuterol, benzonatate, docusate sodium, hydrOXYzine, oxyCODONE, QUEtiapine, sodium chloride flush  Assessment/ Plan:  Janet Olson is a 50 y.o.  female known to our practice, has past medical history of diastolic heart failure,anemia, COPD,hypertension and ESRD on hemodialysis MWF. She presented to the ED with c/o chills and SOB x 1 day.She went to her dialysis center today,3 minutes into the treatment, she reported worsening chills and was sent to ED.  #  Hyperkalemia Potassium 5.3 Planning dialysis today  #ESRD on dialysis MWF Dialysis today Orders placed Plan to continue MWF schedule  #Hypertension Normotensive today BP 126/71 On Amlodipine,Metorpolol and Torsemide  # Secondary Hyperparathyroidism Phosphorus 6.4 on 03/03/2020 Calcium 9.2  #Anemia of CKD Hemoglobin 9.6  No acute indication for Epogen    LOS: 0 Nana Vastine 11/3/20216:15 PM

## 2020-11-02 NOTE — ED Notes (Signed)
Patient placed on 2L [place]. Tamala Julian, MD aware.

## 2020-11-02 NOTE — Progress Notes (Signed)
PHARMACY -  BRIEF ANTIBIOTIC NOTE   Pharmacy has received consult(s) for pneumonia from an ED provider.  The patient's profile has been reviewed for ht/wt/allergies/indication/available labs.    One time order(s) placed for cefepime, vanc, and azithro.  Further antibiotics/pharmacy consults should be ordered by admitting physician if indicated.                       Thank you, Renda Rolls, PharmD, [facility] 11/02/2020 5:01 PM

## 2020-11-02 NOTE — ED Notes (Signed)
Per floor, patient is going to room 143.

## 2020-11-02 NOTE — ED Notes (Signed)
Patient to dialysis at this time.

## 2020-11-02 NOTE — Sepsis Progress Note (Signed)
Accepted order to track Code Sepsis.

## 2020-11-02 NOTE — Progress Notes (Signed)
PHARMACY -  BRIEF ANTIBIOTIC NOTE   Pharmacy has received consult(s) for vancomycin and cefepime from an ED provider.  The patient's profile has been reviewed for ht/wt/allergies/indication/available labs.    One time order(s) placed for vancomycin 1 g + cefepime 2 g  Further antibiotics/pharmacy consults should be ordered by admitting physician if indicated.                       Thank you,  Tawnya Crook, PharmD 11/02/2020  4:47 PM

## 2020-11-02 NOTE — ED Notes (Signed)
Per EMS, HR was in the 40s but no EKG was performed. Pt taken back and EKF performed. HR now 83.

## 2020-11-02 NOTE — ED Notes (Signed)
Date and time results received: 11/02/20 1642 (use smartphrase ".now" to insert current time)  Test: Lactic Acid Critical Value: 3.1  Name of Provider Notified: Tamala Julian, MD  Orders Received? Or Actions Taken?: Orders Received - See Orders for details

## 2020-11-02 NOTE — ED Notes (Signed)
Floor unable to take report at this time. States they will call back in 5 minutes.

## 2020-11-02 NOTE — Sepsis Progress Note (Signed)
Notified bedside nurse of need to draw blood cultures and then administer antibiotics.

## 2020-11-02 NOTE — ED Notes (Signed)
MD at bedside. 

## 2020-11-02 NOTE — ED Triage Notes (Signed)
Pt comes [facility] from dialysis. Pt started having chills and feeling bad during her dialysis. Got about 1/8 of the way through her dialysis treatment. Has been out of BP meds for 2 days. CBG was 88. Loletha Grayer in the 86s. 98% on RA.

## 2020-11-02 NOTE — ED Notes (Signed)
Patient reports feeling tired and having chills since yesterday. Unable to complete dialysis this morning.

## 2020-11-02 NOTE — ED Provider Notes (Addendum)
[facility] Emergency Department Provider Note  ____________________________________________   First MD Initiated Contact with Patient 11/02/20 1527     (approximate)  I have reviewed the triage vital signs and the nursing notes.   HISTORY  Chief Complaint Weakness   HPI Janet Olson is a 50 y.o. female with a past medical history of diastolic heart failure, anemia, COPD, depression, HTN, asthma, ongoing tobacco abuse, and ESRD typically on HD MWF who presents for assessment of some chills and shortness of breath that began yesterday.  Patient states that she got only a very small amount of her dialysis done today but then her chills got too bad and she requested to go to emergency room.  She states she also has developed some soreness in her muscles and a headache but denies any falls or injuries, chest pain, change in her chronic cough, abdominal pain, back pain, rash, extremity pain.  She states she still makes some urine and denies any dysuria.  She states she has hemorrhoids and chronically has some bright red stool occasionally mixed in with her group around toilet paper but this is not changed at all.  She denies EtOH or illicit drug use.         Past Medical History:  Diagnosis Date  . (HFpEF) heart failure with preserved ejection fraction ([place])    a. 2015 Echo: Nl EF. Mild to mod LVH. Mild PR/MR; b. 04/2017 Echo: EF 55-60%, no rwma, Gr1 DD. Mildly dil LA. Small effusion; c. 12/2018 Echo: EF 55-60%, mildly dil LA/PA. Mod dil RA. Mild to mod RV dil. Mildly reduced RV fxn. Mild TR. Mod elev PASP.  Marland Kitchen Abdominal ascites   . Acute hypoxemic respiratory failure ([place]) 01/03/2019  . Acute renal failure (ARF) ([place]) 05/17/2017  . Acute respiratory failure ([place]) 09/25/2018  . Anemia   . Anxiety   . Arthritis   . Asthma   . Cardiomegaly   . COPD (chronic obstructive pulmonary disease) ([place])   . Depression   . Dialysis patient ([place])    MON, WED , FRI  .  Dyspnea   . ESRD (end stage renal disease) ([place])   . GERD (gastroesophageal reflux disease)   . Headache    Migraines  . Hypertension   . Nonrheumatic tricuspid (valve) insufficiency   . Respiratory failure with hypoxia ([place]) 04/21/2018  . Ulcer    gastric  . Vitamin D deficiency     Patient Active Problem List   Diagnosis Date Noted  . Abdominal ascites   . Acute on chronic diastolic CHF (congestive heart failure) ([place]) 03/03/2020  . Anasarca associated with disorder of kidney 03/03/2020  . Hyponatremia with excess extracellular fluid volume 03/03/2020  . Hyperkalemia 03/03/2020  . Diastolic dysfunction with chronic heart failure ([place]) 04/28/2019  . Nonrheumatic tricuspid valve regurgitation 04/28/2019  . Tobacco use 04/28/2019  . Pericardial effusion 01/26/2019  . Shortness of breath 01/26/2019  . Altered mental status 12/04/2018  . Renal dialysis device, implant, or graft complication 72/53/6644  . ESRD on dialysis ([place]) 08/02/2017  . COPD (chronic obstructive pulmonary disease) ([place]) 06/20/2017  . Anemia in chronic kidney disease 06/07/2017  . 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 Mass of left axilla 12/07/2013    Past Surgical History:  Procedure Laterality Date  . A/V FISTULAGRAM Left 04/17/2018   Procedure: A/V FISTULAGRAM;  Surgeon: Algernon Huxley, MD;  Location: [place] [facility];  Service: Cardiovascular;  Laterality: Left;  . A/V FISTULAGRAM Left  05/27/2018   Procedure: A/V FISTULAGRAM;  Surgeon: Katha Cabal, MD;  Location: [place] [facility];  Service: Cardiovascular;  Laterality: Left;  . A/V FISTULAGRAM Left 07/05/2020   Procedure: A/V FISTULAGRAM;  Surgeon: Katha Cabal, MD;  Location: [place] [facility];  Service: Cardiovascular;  Laterality: Left;  . AV FISTULA PLACEMENT Left 08/08/2017   Procedure: ARTERIOVENOUS (AV) FISTULA CREATION ( BRACHIOCEPHALIC );  Surgeon: Algernon Huxley, MD;  Location: [facility];  Service: Vascular;  Laterality:  Left;  . AV FISTULA PLACEMENT Left 08/05/2020   Procedure: INSERTION OF ARTERIOVENOUS (AV) GRAFT ARM(BRACHIAL AXILLARY);  Surgeon: Katha Cabal, MD;  Location: [facility];  Service: Vascular;  Laterality: Left;  . CESAREAN SECTION     x 4 (1988, 1991, 1997, 2000 )  . COLONOSCOPY WITH PROPOFOL N/A 04/24/2018   Procedure: COLONOSCOPY WITH PROPOFOL;  Surgeon: Lin Landsman, MD;  Location: [facility];  Service: Gastroenterology;  Laterality: N/A;  . DIALYSIS/PERMA CATHETER INSERTION N/A 07/16/2017   Procedure: Dialysis/Perma Catheter Insertion;  Surgeon: Katha Cabal, MD;  Location: [place] [facility];  Service: Cardiovascular;  Laterality: N/A;  . DIALYSIS/PERMA CATHETER REMOVAL N/A 10/29/2017   Procedure: DIALYSIS/PERMA CATHETER REMOVAL;  Surgeon: Katha Cabal, MD;  Location: [place] [facility];  Service: Cardiovascular;  Laterality: N/A;  . excision of lymph nodes Bilateral 2014   bilateral under arms.  Marland Kitchen PARATHYROID IMPLANT REMOVAL    . REMOVAL OF A DIALYSIS CATHETER Left 08/05/2020   Procedure: REMOVAL OF A DIALYSIS CATHETER ( FISTULA );  Surgeon: Katha Cabal, MD;  Location: [facility];  Service: Vascular;  Laterality: Left;    Prior to Admission medications   Medication Sig Start Date End Date Taking? Authorizing Provider  albuterol (PROVENTIL) (2.5 MG/3ML) 0.083% nebulizer solution Take 2.5 mg by nebulization every 6 (six) hours as needed for wheezing or shortness of breath.     [provider]  albuterol (VENTOLIN HFA) 108 (90 Base) MCG/ACT inhaler Inhale 2 puffs into the lungs every 6 (six) hours as needed for wheezing or shortness of breath.  12/22/19   [provider]  amLODipine (NORVASC) 10 MG tablet Take 1 tablet (10 mg total) by mouth daily. 06/12/17   Doles-Johnson, Teah, NP  benzonatate (TESSALON) 100 MG capsule Take 100 mg by mouth 2 (two) times daily as needed for cough.  08/03/19   [provider]  calcium acetate (PHOSLO)  667 MG capsule Take 2 capsules (1,334 mg total) by mouth 3 (three) times daily with meals. 09/27/18   Loletha Grayer, MD  chlorpheniramine-HYDROcodone (TUSSIONEX) 10-8 MG/5ML SUER Take 5 mLs by mouth daily as needed for cough.     [provider]  docusate sodium (COLACE) 100 MG capsule Take 100 mg by mouth daily as needed for moderate constipation.     [provider]  DULERA 200-5 MCG/ACT AERO Inhale 2 puffs into the lungs 2 (two) times daily. 01/16/18   [provider]  DULoxetine (CYMBALTA) 60 MG capsule Take 60 mg by mouth 2 (two) times daily.  12/05/17   [provider]  hydrOXYzine (ATARAX/VISTARIL) 25 MG tablet Take 25 mg by mouth every 8 (eight) hours as needed for itching.  12/21/19   [provider]  Melatonin 10 MG CAPS Take 10 mg by mouth at bedtime as needed (sleep).     [provider]  metoprolol tartrate (LOPRESSOR) 50 MG tablet Take 50 mg by mouth 2 (two) times daily. 02/09/20   [provider]  multivitamin (RENA-VIT) TABS tablet Take 1 tablet by mouth daily.  08/23/17   [provider]  oxyCODONE (OXY IR/ROXICODONE) 5 MG immediate release tablet Take 1 tablet (5 mg total) by mouth every 6 (six) hours as needed for moderate pain or severe pain. 08/25/20   Schnier, Dolores Lory, MD  oxyCODONE-acetaminophen (PERCOCET) 5-325 MG tablet Take 1-2 tablets by mouth every 6 (six) hours as needed for moderate pain or severe pain. Patient not taking: Reported on 08/25/2020 08/05/20 08/05/21  Schnier, Dolores Lory, MD  pantoprazole (PROTONIX) 40 MG tablet Take 1 tablet (40 mg total) by mouth daily. 12/20/18   Schuyler Amor, MD  propranolol (INDERAL) 40 MG tablet Take 40 mg by mouth daily. 08/22/20   [provider]  QUEtiapine (SEROQUEL) 25 MG tablet Take 25 mg by mouth at bedtime as needed (sleep).  05/24/20   [provider]  torsemide (DEMADEX) 100 MG tablet Take 1 tablet (100 mg total) by mouth daily. 09/27/18    Loletha Grayer, MD  Vitamin D, Ergocalciferol, (DRISDOL) 50000 units CAPS capsule Take 50,000 Units by mouth every Sunday.  10/22/17   [provider]    Allergies Patient has no known allergies.  Family History  Problem Relation Age of Onset  . Diabetes Mother     Social History Social History   Tobacco Use  . Smoking status: Current Every Day Smoker    Packs/day: 0.50    Years: 30.00    Pack years: 15.00    Types: Cigarettes  . Smokeless tobacco: Never Used  Vaping Use  . Vaping Use: Never used  Substance Use Topics  . Alcohol use: No  . Drug use: No    Review of Systems  Review of Systems  Constitutional: Positive for chills and malaise/fatigue. Negative for fever.  HENT: Negative for sore throat.   Eyes: Negative for pain.  Respiratory: Positive for cough ( chronic). Negative for stridor.   Cardiovascular: Negative for chest pain.  Gastrointestinal: Positive for diarrhea ( chronic). Negative for vomiting.  Genitourinary: Negative for dysuria.  Musculoskeletal: Positive for myalgias.  Skin: Negative for rash.  Neurological: Positive for headaches. Negative for seizures and loss of consciousness.  Psychiatric/Behavioral: Negative for suicidal ideas.  All other systems reviewed and are negative.     ____________________________________________   PHYSICAL EXAM:  VITAL SIGNS: ED Triage Vitals  Enc Vitals Group     BP 11/02/20 1226 (!) 179/83     Pulse Rate 11/02/20 1226 83     Resp 11/02/20 1226 18     Temp 11/02/20 1226 98.1 F (36.7 C)     Temp Source 11/02/20 1226 Oral     SpO2 11/02/20 1226 100 %     Weight 11/02/20 1224 181 lb 3.5 oz (82.2 kg)     Height 11/02/20 1224 5\' 6"  (1.676 m)     Head Circumference --      Peak Flow --      Pain Score 11/02/20 1224 8     Pain Loc --      Pain Edu? --      Excl. in [place]? --    Vitals:   11/02/20 1607 11/02/20 1610  BP: 121/68   Pulse:    Resp:    Temp:    SpO2:  97%   Physical  Exam Vitals and nursing note reviewed.  Constitutional:      General: She is not in acute distress.    Appearance: She is well-developed.  HENT:     Head: Normocephalic and atraumatic.     Right Ear: External ear normal.     Left Ear: External ear normal.     Nose: Nose normal.  Eyes:     Conjunctiva/sclera: Conjunctivae normal.  Cardiovascular:     Rate and Rhythm: Normal rate and regular rhythm.     Heart sounds: No murmur heard.   Pulmonary:     Effort: Pulmonary effort is normal. No respiratory distress.     Breath sounds: Normal breath sounds.  Abdominal:     General: There is distension.     Palpations: Abdomen is soft.     Tenderness: There is no abdominal tenderness. There is no right CVA tenderness or left CVA tenderness.  Musculoskeletal:     Cervical back: Neck supple.  Skin:    General: Skin is warm and dry.  Neurological:     General: No focal deficit present.     Mental Status: She is alert and oriented to person, place, and time.  Psychiatric:        Mood and Affect: Mood normal.      ____________________________________________   LABS (all labs ordered are listed, but only abnormal results are displayed)  Labs Reviewed  BASIC METABOLIC PANEL - Abnormal; Notable for the following components:      Result Value   Sodium 131 (*)    Potassium 5.3 (*)    Chloride 90 (*)    CO2 21 (*)    BUN 54 (*)    Creatinine, Ser 7.31 (*)    GFR, Estimated 6 (*)    Anion gap 20 (*)    All other components within normal limits  CBC - Abnormal; Notable for the following components:   WBC 11.6 (*)    RBC 3.63 (*)    Hemoglobin 9.6 (*)    HCT 30.4 (*)    All other components within normal limits  MAGNESIUM - Abnormal; Notable for the following components:   Magnesium 1.6 (*)    All other components within normal limits  HEPATIC FUNCTION PANEL - Abnormal; Notable for the following components:   Albumin 3.4 (*)    AST 74 (*)    ALT 50 (*)    Alkaline Phosphatase  146 (*)    Total Bilirubin 1.3 (*)    Bilirubin, Direct 0.3 (*)    Indirect Bilirubin 1.0 (*)    All other components within normal limits  BRAIN NATRIURETIC PEPTIDE - Abnormal; Notable for the following components:   B Natriuretic Peptide >4,500.0 (*)    All other components within normal limits  LACTIC ACID, PLASMA - Abnormal; Notable for the following components:   Lactic Acid, Venous 3.1 (*)    All other components within normal limits  CBG MONITORING, ED - Abnormal; Notable for the following components:   Glucose-Capillary 60 (*)    All other components within normal limits  RESPIRATORY PANEL BY RT PCR (FLU A&B, COVID)  GASTROINTESTINAL PANEL BY PCR, STOOL (REPLACES STOOL CULTURE)  C DIFFICILE QUICK SCREEN W PCR REFLEX  CULTURE, BLOOD (ROUTINE X 2)  CULTURE, BLOOD (ROUTINE X 2)  URINE CULTURE  TSH  PROCALCITONIN  URINALYSIS, COMPLETE (UACMP) WITH MICROSCOPIC  LACTIC ACID, PLASMA  PROTIME-INR  APTT  CBG MONITORING, ED  CBG MONITORING, ED  POC URINE PREG, ED  CBG MONITORING, ED  TROPONIN I (HIGH SENSITIVITY)   ____________________________________________  EKG  Sinus rhythm with ventricular rate of 83, right axis deviation, prolonged QTc interval at  502, prolonged PR interval at 202, nonspecific ST changes in V2 without a clear evidence of acute ischemia. ____________________________________________  RADIOLOGY  ED MD interpretation: Vascular congestion and some edema without clear focal consolidation, large effusion, pneumothorax.  Official radiology report(s): DG Chest 1 View  Result Date: 11/02/2020 CLINICAL DATA:  Chills during dialysis EXAM: CHEST  1 VIEW COMPARISON:  03/03/2020 FINDINGS: Single frontal view of the chest demonstrates right internal jugular dialysis catheter tip overlying superior vena cava. Cardiac silhouette remains enlarged. There is central vascular congestion with mild diffuse interstitial prominence. No airspace disease, effusion, or  pneumothorax. No acute bony abnormality. IMPRESSION: 1. Vascular congestion and mild interstitial edema. 2. Stable enlarged cardiac silhouette. Electronically Signed   By: Randa Ngo M.D.   On: 11/02/2020 15:49    ____________________________________________   PROCEDURES  Procedure(s) performed (including Critical Care):  .1-3 Lead EKG Interpretation Performed by: Lucrezia Starch, MD Authorized by: Lucrezia Starch, MD     Interpretation: normal     ECG rate assessment: normal     Rhythm: sinus rhythm     Ectopy: none     Conduction: normal       ____________________________________________   INITIAL IMPRESSION / ASSESSMENT AND PLAN / ED COURSE        Patient presents with [place] to history exam for assessment of chills and some shortness of breath that began yesterday. Patient did not get her full dialysis treatment today. She is also not been taking her blood pressure medicines for 2 days. On arrival patient is noted to be hypertensive with a BP of 179/83 with otherwise stable vital signs on room air. However on reassessment patient was noted to have an SPO2 saturation in the 87-88 range and she was placed on 2 L with improvement back to the mid 90s.  Differential includes but is not limited to acute hypoxic respiratory failure secondary to volume overload from not completing dialysis today, viral versus bacterial pneumonia, arrhythmia, ACS, acute on chronic heart failure, urinary source versus GI source of infection given elevated white blood cell count and procalcitonin.  Given these findings concerning for infection cultures were obtained and patient was given broad-spectrum antibiotics. However she does appear volume overloaded on exam imaging and her BNP is greater than 4500 so will defer IV fluids at this time. Patient refused and to assess UA for evidence of infection. ECG is reassuring and troponin taken with history of no history of chest pain are not suggestive of ACS  or myocarditis. No significant arrhythmia on ECG. BMP remarkable for hypokalemia with a K of 5.3, elevated BUN at 54, and anion gap of 20 with a creatinine of 7.31. CBC remarkable for leukocytosis with WBC count of 11.6, hemoglobin 9. 6, and abnormal platelets. This hemoglobin is compared to that obtained 2 months ago of 9.2 and does not appear to be an acute change. Magnesium is 1.9. This was repleted. Is possible patient's elevated lactic acid is also related to poor extremity perfusion secondary to acute on chronic heart failure.  I did discuss patient's presentation my concern for volume overload contributing to her symptoms with on-call nephrologist Dr. Juleen China who stated he would arrange for patient to receive dialysis this evening.  I will plan to admit to medicine service for further evaluation management.  ____________________________________________   FINAL CLINICAL IMPRESSION(S) / ED DIAGNOSES  Final diagnoses:  Hypervolemia, unspecified hypervolemia type  Elevated brain natriuretic peptide (BNP) level  Leukocytosis, unspecified type  Lactic acid acidosis  High anion gap metabolic acidosis  Elevated BUN    Medications  magnesium sulfate IVPB 2 g 50 mL (2 g Intravenous New Bag/Given 11/02/20 1610)  amLODipine (NORVASC) tablet 10 mg (10 mg Oral Not Given 11/02/20 1618)  sodium chloride flush (NS) 0.9 % injection 3 mL (has no administration in time range)  sodium chloride flush (NS) 0.9 % injection 3 mL (has no administration in time range)  0.9 %  sodium chloride infusion (has no administration in time range)  vancomycin (VANCOCIN) IVPB 1000 mg/200 mL premix (has no administration in time range)  ceFEPIme (MAXIPIME) 2 g in sodium chloride 0.9 % 100 mL IVPB (has no administration in time range)  azithromycin (ZITHROMAX) 500 mg in sodium chloride 0.9 % 250 mL IVPB (has no administration in time range)     ED Discharge Orders    None       Note:  This document was prepared  using Dragon voice recognition software and may include unintentional dictation errors.   Lucrezia Starch, MD 11/02/20 1706    Lucrezia Starch, MD 11/02/20 1710

## 2020-11-03 ENCOUNTER — Observation Stay: Payer: Medicare Other

## 2020-11-03 ENCOUNTER — Encounter
Admission: EM | Discharge status: Against Medical Advice | Payer: Self-pay | Source: Home / Self Care | Attending: Internal Medicine

## 2020-11-03 DIAGNOSIS — N186 End stage renal disease: Secondary | ICD-10-CM

## 2020-11-03 DIAGNOSIS — N2581 Secondary hyperparathyroidism of renal origin: Secondary | ICD-10-CM | POA: Diagnosis present

## 2020-11-03 DIAGNOSIS — Z833 Family history of diabetes mellitus: Secondary | ICD-10-CM | POA: Diagnosis not present

## 2020-11-03 DIAGNOSIS — K529 Noninfective gastroenteritis and colitis, unspecified: Secondary | ICD-10-CM | POA: Diagnosis present

## 2020-11-03 DIAGNOSIS — I5032 Chronic diastolic (congestive) heart failure: Secondary | ICD-10-CM | POA: Diagnosis present

## 2020-11-03 DIAGNOSIS — F17210 Nicotine dependence, cigarettes, uncomplicated: Secondary | ICD-10-CM | POA: Diagnosis present

## 2020-11-03 DIAGNOSIS — Y832 Surgical operation with anastomosis, bypass or graft as the cause of abnormal reaction of the patient, or of later complication, without mention of misadventure at the time of the procedure: Secondary | ICD-10-CM | POA: Diagnosis present

## 2020-11-03 DIAGNOSIS — A419 Sepsis, unspecified organism: Secondary | ICD-10-CM | POA: Diagnosis not present

## 2020-11-03 DIAGNOSIS — A4181 Sepsis due to Enterococcus: Secondary | ICD-10-CM | POA: Diagnosis present

## 2020-11-03 DIAGNOSIS — I2510 Atherosclerotic heart disease of native coronary artery without angina pectoris: Secondary | ICD-10-CM | POA: Diagnosis present

## 2020-11-03 DIAGNOSIS — T82848A Pain from vascular prosthetic devices, implants and grafts, initial encounter: Secondary | ICD-10-CM | POA: Diagnosis present

## 2020-11-03 DIAGNOSIS — I371 Nonrheumatic pulmonary valve insufficiency: Secondary | ICD-10-CM | POA: Diagnosis not present

## 2020-11-03 DIAGNOSIS — I132 Hypertensive heart and chronic kidney disease with heart failure and with stage 5 chronic kidney disease, or end stage renal disease: Secondary | ICD-10-CM | POA: Diagnosis present

## 2020-11-03 DIAGNOSIS — E876 Hypokalemia: Secondary | ICD-10-CM | POA: Diagnosis present

## 2020-11-03 DIAGNOSIS — I10 Essential (primary) hypertension: Secondary | ICD-10-CM

## 2020-11-03 DIAGNOSIS — E559 Vitamin D deficiency, unspecified: Secondary | ICD-10-CM | POA: Diagnosis present

## 2020-11-03 DIAGNOSIS — E1122 Type 2 diabetes mellitus with diabetic chronic kidney disease: Secondary | ICD-10-CM | POA: Diagnosis present

## 2020-11-03 DIAGNOSIS — R7881 Bacteremia: Secondary | ICD-10-CM

## 2020-11-03 DIAGNOSIS — E11649 Type 2 diabetes mellitus with hypoglycemia without coma: Secondary | ICD-10-CM | POA: Diagnosis present

## 2020-11-03 DIAGNOSIS — H938X2 Other specified disorders of left ear: Secondary | ICD-10-CM | POA: Diagnosis present

## 2020-11-03 DIAGNOSIS — I340 Nonrheumatic mitral (valve) insufficiency: Secondary | ICD-10-CM | POA: Diagnosis not present

## 2020-11-03 DIAGNOSIS — Z992 Dependence on renal dialysis: Secondary | ICD-10-CM

## 2020-11-03 DIAGNOSIS — Z79899 Other long term (current) drug therapy: Secondary | ICD-10-CM | POA: Diagnosis not present

## 2020-11-03 DIAGNOSIS — Z20822 Contact with and (suspected) exposure to covid-19: Secondary | ICD-10-CM | POA: Diagnosis present

## 2020-11-03 DIAGNOSIS — E875 Hyperkalemia: Secondary | ICD-10-CM | POA: Diagnosis present

## 2020-11-03 DIAGNOSIS — F418 Other specified anxiety disorders: Secondary | ICD-10-CM | POA: Diagnosis present

## 2020-11-03 DIAGNOSIS — B952 Enterococcus as the cause of diseases classified elsewhere: Secondary | ICD-10-CM

## 2020-11-03 DIAGNOSIS — R188 Other ascites: Secondary | ICD-10-CM | POA: Diagnosis present

## 2020-11-03 DIAGNOSIS — J449 Chronic obstructive pulmonary disease, unspecified: Secondary | ICD-10-CM | POA: Diagnosis present

## 2020-11-03 DIAGNOSIS — E872 Acidosis: Secondary | ICD-10-CM | POA: Diagnosis present

## 2020-11-03 DIAGNOSIS — E8809 Other disorders of plasma-protein metabolism, not elsewhere classified: Secondary | ICD-10-CM | POA: Diagnosis present

## 2020-11-03 DIAGNOSIS — D631 Anemia in chronic kidney disease: Secondary | ICD-10-CM | POA: Diagnosis present

## 2020-11-03 HISTORY — PX: DIALYSIS/PERMA CATHETER REMOVAL: CATH118289

## 2020-11-03 LAB — BODY FLUID CELL COUNT WITH DIFFERENTIAL
Lymphs, Fluid: 37 %
Monocyte-Macrophage-Serous Fluid: 24 %
Neutrophil Count, Fluid: 39 %
Total Nucleated Cell Count, Fluid: 164 cu mm

## 2020-11-03 LAB — BASIC METABOLIC PANEL
Anion gap: 14 (ref 5–15)
BUN: 34 mg/dL — ABNORMAL HIGH (ref 6–20)
CO2: 26 mmol/L (ref 22–32)
Calcium: 8.7 mg/dL — ABNORMAL LOW (ref 8.9–10.3)
Chloride: 94 mmol/L — ABNORMAL LOW (ref 98–111)
Creatinine, Ser: 5.21 mg/dL — ABNORMAL HIGH (ref 0.44–1.00)
GFR, Estimated: 9 mL/min — ABNORMAL LOW (ref >60)
Glucose, Bld: 156 mg/dL — ABNORMAL HIGH (ref 70–99)
Potassium: 4.2 mmol/L (ref 3.5–5.1)
Sodium: 134 mmol/L — ABNORMAL LOW (ref 135–145)

## 2020-11-03 LAB — BLOOD CULTURE ID PANEL (REFLEXED) - BCID2

## 2020-11-03 LAB — VITAMIN B12: Vitamin B-12: 853 pg/mL (ref 180–914)

## 2020-11-03 LAB — GASTROINTESTINAL PANEL BY PCR, STOOL (REPLACES STOOL CULTURE)
Adenovirus F40/41: NOT DETECTED
Astrovirus: NOT DETECTED
Campylobacter species: NOT DETECTED
Cryptosporidium: NOT DETECTED
Cyclospora cayetanensis: NOT DETECTED
Entamoeba histolytica: NOT DETECTED
Enteroaggregative E coli (EAEC): NOT DETECTED
Enteropathogenic E coli (EPEC): NOT DETECTED
Enterotoxigenic E coli (ETEC): NOT DETECTED
Giardia lamblia: NOT DETECTED
Norovirus GI/GII: NOT DETECTED
Plesimonas shigelloides: DETECTED — AB
Rotavirus A: NOT DETECTED
Salmonella species: NOT DETECTED
Sapovirus (I, II, IV, and V): NOT DETECTED
Shiga like toxin producing E coli (STEC): NOT DETECTED
Shigella/Enteroinvasive E coli (EIEC): NOT DETECTED
Vibrio cholerae: NOT DETECTED
Vibrio species: NOT DETECTED
Yersinia enterocolitica: NOT DETECTED

## 2020-11-03 LAB — CBC
HCT: 26.6 % — ABNORMAL LOW (ref 36.0–46.0)
Hemoglobin: 8.6 g/dL — ABNORMAL LOW (ref 12.0–15.0)
MCH: 26.4 pg (ref 26.0–34.0)
MCHC: 32.3 g/dL (ref 30.0–36.0)
MCV: 81.6 fL (ref 80.0–100.0)
Platelets: 140 10*3/uL — ABNORMAL LOW (ref 150–400)
RBC: 3.26 MIL/uL — ABNORMAL LOW (ref 3.87–5.11)
RDW: 15.1 % (ref 11.5–15.5)
WBC: 13.4 10*3/uL — ABNORMAL HIGH (ref 4.0–10.5)
nRBC: 0 % (ref 0.0–0.2)

## 2020-11-03 LAB — URINALYSIS, COMPLETE (UACMP) WITH MICROSCOPIC
Bilirubin Urine: NEGATIVE
Glucose, UA: NEGATIVE mg/dL
Ketones, ur: NEGATIVE mg/dL
Nitrite: NEGATIVE
Protein, ur: >=300 mg/dL — AB
Specific Gravity, Urine: 1.015 (ref 1.005–1.030)
pH: 6 (ref 5.0–8.0)

## 2020-11-03 LAB — HEPATIC FUNCTION PANEL
ALT: 86 U/L — ABNORMAL HIGH (ref 0–44)
AST: 133 U/L — ABNORMAL HIGH (ref 15–41)
Albumin: 3 g/dL — ABNORMAL LOW (ref 3.5–5.0)
Alkaline Phosphatase: 129 U/L — ABNORMAL HIGH (ref 38–126)
Bilirubin, Direct: 0.2 mg/dL (ref 0.0–0.2)
Indirect Bilirubin: 0.8 mg/dL (ref 0.3–0.9)
Total Bilirubin: 1 mg/dL (ref 0.3–1.2)
Total Protein: 7 g/dL (ref 6.5–8.1)

## 2020-11-03 LAB — C DIFFICILE QUICK SCREEN W PCR REFLEX
C Diff antigen: NEGATIVE
C Diff interpretation: NOT DETECTED
C Diff toxin: NEGATIVE

## 2020-11-03 LAB — PATHOLOGIST SMEAR REVIEW

## 2020-11-03 LAB — PROTIME-INR
INR: 1.4 — ABNORMAL HIGH (ref 0.8–1.2)
Prothrombin Time: 16.4 seconds — ABNORMAL HIGH (ref 11.4–15.2)

## 2020-11-03 LAB — APTT: aPTT: 36 seconds (ref 24–36)

## 2020-11-03 SURGERY — DIALYSIS/PERMA CATHETER REMOVAL
Anesthesia: LOCAL

## 2020-11-03 MED ORDER — SODIUM CHLORIDE 0.9 % IV SOLN
2.0000 g | INTRAVENOUS | Status: DC
  Filled 2020-11-03 (×5): qty 2000

## 2020-11-03 MED ORDER — NICOTINE 21 MG/24HR TD PT24
21.0000 mg | MEDICATED_PATCH | Freq: Every day | TRANSDERMAL | Status: DC
  Filled 2020-11-03 (×2): qty 1

## 2020-11-03 MED ORDER — LIDOCAINE-EPINEPHRINE (PF) 1 %-1:200000 IJ SOLN
INTRAMUSCULAR | Status: DC | PRN
  Administered 2020-11-03: 20 mL via INTRADERMAL

## 2020-11-03 MED ORDER — CHLORHEXIDINE GLUCONATE CLOTH 2 % EX PADS
6.0000 | MEDICATED_PAD | Freq: Every day | CUTANEOUS | Status: DC
  Administered 2020-11-03: 6 via TOPICAL

## 2020-11-03 MED ORDER — SODIUM CHLORIDE 0.9 % IV SOLN
2.0000 g | Freq: Two times a day (BID) | INTRAVENOUS | Status: DC
  Administered 2020-11-03 – 2020-11-05 (×4): 2 g via INTRAVENOUS
  Filled 2020-11-03: qty 2000
  Filled 2020-11-03: qty 2
  Filled 2020-11-03 (×5): qty 2000

## 2020-11-03 SURGICAL SUPPLY — 3 items
CHLORAPREP W/TINT 26 (MISCELLANEOUS) ×4 IMPLANT
FORCEPS HALSTEAD CVD 5IN STRL (INSTRUMENTS) ×2 IMPLANT
TRAY LACERAT/PLASTIC (MISCELLANEOUS) ×2 IMPLANT

## 2020-11-03 NOTE — Op Note (Signed)
Operative Note     Preoperative diagnosis:   1. ESRD with functional permanent access  Postoperative diagnosis:  1. ESRD with functional permanent access  Procedure:  Removal of right jugular Permcath  Surgeon:  Leotis Pain, MD  Assistant: Hezzie Bump, PA-C  Anesthesia:  Local  EBL:  Minimal  Indication for the Procedure:  The patient has a functional permanent dialysis access and no longer needs their permcath.  This can be removed.  Risks and benefits are discussed and informed consent is obtained.  Description of the Procedure:  The patient's right neck, chest and existing catheter were sterilely prepped and draped. The area around the catheter was anesthetized copiously with 1% lidocaine. The catheter was dissected out with curved hemostats until the cuff was freed from the surrounding fibrous sheath. The fiber sheath was transected, and the catheter was then removed in its entirety using gentle traction. Pressure was held and sterile dressings were placed. The patient tolerated the procedure well and was taken to the recovery room in stable condition.     Leotis Pain  11/03/2020, 4:36 PM This note was created with Dragon Medical transcription system. Any errors in dictation are purely unintentional.

## 2020-11-03 NOTE — Procedures (Signed)
Pre Procedural Dx: Symptomatic Ascites Post Procedural Dx: Same  Successful US guided paracentesis yielding 3.2 L of serous ascitic fluid.  EBL: None Complications: None immediate  Ronny Bacon, MD Pager #: [phone]

## 2020-11-03 NOTE — Consult Note (Signed)
NAME: Janet Olson  DOB: Dec 31, 1970  MRN: [id number]  Date/Time: 11/03/2020 2:56 PM  REQUESTING PROVIDER: Dr. Maylene Roes Subjective:  REASON FOR CONSULT: Enterococcus bacteremia ? Janet Olson is a 50 y.o. female with a history of end-stage renal disease, COPD, hypertension presents from dialysis center by EMS on 11/02/2020 with chills and feeling bad during dialysis.  Patient was 1/8 of the way through her dialysis treatment.  Pt says sthe day before the dialysis she was very cold and feeling weak. She has also had loose stools for nearly a month. She was out of her BP meds for the past 2 days.  BP was 179/83, temp of 98.1, heart rate was 83.  EMS recorded a heart rate of 42.  Pulse ox was 100%.  She was feeling weak.  She also was sore in her muscles and had a headache.  She did not have any chest pain or abdominal pain or back pain or rash.  She has chronic cough.  She makes urine. In the ED WBC was 11.6, Hb of 9.6, platelet of 175.  BNP was more than 4500.  Procalcitonin was 53.1.  Blood cultures were sent.  Chest x-ray revealed vascular congestion with mild interstitial edema.  She was started on azithromycin, cefepime and vancomycin.  As blood cultures came back positive for Enterococcus she was switched to ampicillin. I am seeing the patient for the Enterococcus bacteremia. 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 She is currently being dialyzed through a catheter. Past Medical History:  Diagnosis Date  . (HFpEF) heart failure with preserved ejection fraction ([place])    a. 2015 Echo: Nl EF. Mild to mod LVH. Mild PR/MR; b. 04/2017 Echo: EF 55-60%, no rwma, Gr1 DD. Mildly dil LA. Small effusion; c. 12/2018 Echo: EF 55-60%, mildly dil LA/PA. Mod dil RA. Mild to mod RV dil. Mildly reduced RV fxn. Mild TR. Mod elev PASP.  Marland Kitchen Abdominal ascites   . Acute hypoxemic respiratory failure  ([place]) 01/03/2019  . Acute renal failure (ARF) ([place]) 05/17/2017  . Acute respiratory failure ([place]) 09/25/2018  . Anemia   . Anxiety   . Arthritis   . Asthma   . Cardiomegaly   . COPD (chronic obstructive pulmonary disease) ([place])   . Depression   . Dialysis patient ([place])    MON, WED , FRI  . Dyspnea   . ESRD (end stage renal disease) ([place])   . GERD (gastroesophageal reflux disease)   . Headache    Migraines  . Hypertension   . Nonrheumatic tricuspid (valve) insufficiency   . Respiratory failure with hypoxia ([place]) 04/21/2018  . Ulcer    gastric  . Vitamin D deficiency     Past Surgical History:  Procedure Laterality Date  . A/V FISTULAGRAM Left 04/17/2018   Procedure: A/V FISTULAGRAM;  Surgeon: Algernon Huxley, MD;  Location: [place] [facility];  Service: Cardiovascular;  Laterality: Left;  . A/V FISTULAGRAM Left 05/27/2018   Procedure: A/V FISTULAGRAM;  Surgeon: Katha Cabal, MD;  Location: [place] [facility];  Service: Cardiovascular;  Laterality: Left;  . A/V FISTULAGRAM Left 07/05/2020   Procedure: A/V FISTULAGRAM;  Surgeon: Katha Cabal, MD;  Location: [place] [facility];  Service: Cardiovascular;  Laterality: Left;  . AV FISTULA PLACEMENT Left 08/08/2017   Procedure: ARTERIOVENOUS (AV) FISTULA CREATION ( BRACHIOCEPHALIC );  Surgeon: Algernon Huxley, MD;  Location:  [facility];  Service: Vascular;  Laterality: Left;  . AV FISTULA PLACEMENT Left 08/05/2020   Procedure: INSERTION OF ARTERIOVENOUS (AV) GRAFT ARM(BRACHIAL AXILLARY);  Surgeon: Katha Cabal, MD;  Location: [facility];  Service: Vascular;  Laterality: Left;  . CESAREAN SECTION     x 4 (1988, 1991, 1997, 2000 )  . COLONOSCOPY WITH PROPOFOL N/A 04/24/2018   Procedure: COLONOSCOPY WITH PROPOFOL;  Surgeon: Lin Landsman, MD;  Location: [facility];  Service: Gastroenterology;  Laterality: N/A;  . DIALYSIS/PERMA CATHETER INSERTION N/A 07/16/2017   Procedure: Dialysis/Perma Catheter Insertion;  Surgeon: Katha Cabal, MD;  Location: [place] [facility];  Service: Cardiovascular;  Laterality: N/A;  . DIALYSIS/PERMA CATHETER REMOVAL N/A 10/29/2017   Procedure: DIALYSIS/PERMA CATHETER REMOVAL;  Surgeon: Katha Cabal, MD;  Location: [place] [facility];  Service: Cardiovascular;  Laterality: N/A;  . excision of lymph nodes Bilateral 2014   bilateral under arms.  Marland Kitchen PARATHYROID IMPLANT REMOVAL    . REMOVAL OF A DIALYSIS CATHETER Left 08/05/2020   Procedure: REMOVAL OF A DIALYSIS CATHETER ( FISTULA );  Surgeon: Katha Cabal, MD;  Location: [facility];  Service: Vascular;  Laterality: Left;    Social History   Socioeconomic History  . Marital status: Single    Spouse name: Not on file  . Number of children: Not on file  . Years of education: Not on file  . Highest education level: Not on file  Occupational History  . Not on file  Tobacco Use  . Smoking status: Current Every Day Smoker    Packs/day: 0.50    Years: 30.00    Pack years: 15.00    Types: Cigarettes  . Smokeless tobacco: Never Used  Vaping Use  . Vaping Use: Never used  Substance and Sexual Activity  . Alcohol use: No  . Drug use: No  . Sexual activity: Not Currently  Other Topics Concern  . Not on file  Social History Narrative  . Not on file   Social Determinants of Health   Financial Resource Strain:   . Difficulty of Paying Living Expenses: Not on file  Food Insecurity:   . Worried About Charity fundraiser in the Last Year: Not on file  . Ran Out of Food in the Last Year: Not on file  Transportation Needs:   . Lack of Transportation (Medical): Not on file  . Lack of Transportation (Non-Medical): Not on file  Physical Activity:   . Days of Exercise per Week: Not on file  . Minutes of Exercise per Session: Not on file  Stress:   . Feeling of Stress : Not on file  Social Connections:   . Frequency of Communication with Friends and Family: Not on file  . Frequency of Social Gatherings with Friends and  Family: Not on file  . Attends Religious Services: Not on file  . Active Member of Clubs or Organizations: Not on file  . Attends Archivist Meetings: Not on file  . Marital Status: Not on file  Intimate Partner Violence:   . Fear of Current or Ex-Partner: Not on file  . Emotionally Abused: Not on file  . Physically Abused: Not on file  . Sexually Abused: Not on file    Family History  Problem Relation Age of Onset  . Diabetes Mother    No Known Allergies  ? Current Facility-Administered Medications  Medication Dose Route Frequency Provider Last Rate Last Admin  . 0.9 %  sodium  chloride infusion  250 mL Intravenous PRN Cox, Amy N, DO      . albumin human 25 % solution 25 g  25 g Intravenous Once PRN Cox, Amy N, DO      . albuterol (PROVENTIL) (2.5 MG/3ML) 0.083% nebulizer solution 2.5 mg  2.5 mg Nebulization Q6H PRN Cox, Amy N, DO      . albuterol (VENTOLIN HFA) 108 (90 Base) MCG/ACT inhaler 2 puff  2 puff Inhalation Q6H PRN Cox, Amy N, DO      . amLODipine (NORVASC) tablet 10 mg  10 mg Oral Daily Cox, Amy N, DO      . ampicillin (OMNIPEN) 2 g in sodium chloride 0.9 % 100 mL IVPB  2 g Intravenous Q12H Rauer, Samantha O, RPH 300 mL/hr at 11/03/20 1120 2 g at 11/03/20 1120  . benzonatate (TESSALON) capsule 100 mg  100 mg Oral BID PRN Cox, Amy N, DO      . Chlorhexidine Gluconate Cloth 2 % PADS 6 each  6 each Topical Daily Lucrezia Starch, MD   6 each at 11/03/20 1100  . docusate sodium (COLACE) capsule 100 mg  100 mg Oral Daily PRN Cox, Amy N, DO      . DULoxetine (CYMBALTA) DR capsule 60 mg  60 mg Oral BID Cox, Amy N, DO   60 mg at 11/03/20 1101  . heparin injection 5,000 Units  5,000 Units Subcutaneous Q8H Cox, Amy N, DO   5,000 Units at 11/02/20 2313  . hydrOXYzine (ATARAX/VISTARIL) tablet 25 mg  25 mg Oral Q8H PRN Cox, Amy N, DO   25 mg at 11/03/20 1100  . metoprolol tartrate (LOPRESSOR) tablet 50 mg  50 mg Oral BID Cox, Amy N, DO   50 mg at 11/03/20 1100  .  mometasone-formoterol (DULERA) 200-5 MCG/ACT inhaler 2 puff  2 puff Inhalation BID Cox, Amy N, DO   2 puff at 11/03/20 1106  . multivitamin (RENA-VIT) tablet 1 tablet  1 tablet Oral Daily Cox, Amy N, DO   1 tablet at 11/03/20 1102  . oxyCODONE (Oxy IR/ROXICODONE) immediate release tablet 5 mg  5 mg Oral Q6H PRN Cox, Amy N, DO   5 mg at 11/03/20 1100  . pantoprazole (PROTONIX) EC tablet 40 mg  40 mg Oral Daily Cox, Amy N, DO   40 mg at 11/03/20 1100  . QUEtiapine (SEROQUEL) tablet 25 mg  25 mg Oral QHS PRN Cox, Amy N, DO   25 mg at 11/03/20 0259  . sodium chloride flush (NS) 0.9 % injection 3 mL  3 mL Intravenous Q12H Cox, Amy N, DO   3 mL at 11/03/20 1105  . sodium chloride flush (NS) 0.9 % injection 3 mL  3 mL Intravenous PRN Cox, Amy N, DO      . torsemide (DEMADEX) tablet 100 mg  100 mg Oral Daily Cox, Amy N, DO   100 mg at 11/03/20 1101  . [START ON 11/06/2020] Vitamin D (Ergocalciferol) (DRISDOL) capsule 50,000 Units  50,000 Units Oral Q Sun Cox, Amy N, DO         Abtx:  Anti-infectives (From admission, onward)   Start     Dose/Rate Route Frequency Ordered Stop   11/03/20 0830  ampicillin (OMNIPEN) 2 g in sodium chloride 0.9 % 100 mL IVPB        2 g 300 mL/hr over 20 Minutes Intravenous Every 12 hours 11/03/20 0807     11/03/20 0800  ampicillin (OMNIPEN) 2 g in  sodium chloride 0.9 % 100 mL IVPB  Status:  Discontinued        2 g 300 mL/hr over 20 Minutes Intravenous Every 4 hours 11/03/20 0547 11/03/20 0807   11/02/20 1645  vancomycin (VANCOCIN) IVPB 1000 mg/200 mL premix  Status:  Discontinued        1,000 mg 200 mL/hr over 60 Minutes Intravenous  Once 11/02/20 1644 11/03/20 0545   11/02/20 1645  ceFEPIme (MAXIPIME) 2 g in sodium chloride 0.9 % 100 mL IVPB        2 g 200 mL/hr over 30 Minutes Intravenous  Once 11/02/20 1644 11/02/20 1821   11/02/20 1645  azithromycin (ZITHROMAX) 500 mg in sodium chloride 0.9 % 250 mL IVPB  Status:  Discontinued        500 mg 250 mL/hr over 60 Minutes  Intravenous  Once 11/02/20 1644 11/03/20 0546      REVIEW OF SYSTEMS:  Const: fever,  chills, negative weight loss Eyes: negative diplopia or visual changes, negative eye pain ENT: negative coryza, negative sore throat Resp: negative cough, hemoptysis, dyspnea Cards: negative for chest pain, palpitations, lower extremity edema GU: negative for frequency, dysuria and hematuria GI: abdominal pain, diarrhea, distension Skin: negative for rash and pruritus Heme: negative for easy bruising and gum/nose bleeding MS: weakness Neurolo:+ headaches, dizziness, vertigo, memory problems  Psych: negative for feelings of anxiety, depression  Endocrine: negative for thyroid, diabetes Allergy/Immunology- negative for any medication or food allergies  Objective:  VITALS:  BP 116/78 (BP Location: Right Arm)   Pulse 75   Temp 98.6 F (37 C) (Oral)   Resp 16   Ht 5\' 6"  (1.676 m)   Wt 82.2 kg   LMP 06/10/2018 (Approximate)   SpO2 100%   BMI 29.25 kg/m  PHYSICAL EXAM:  General: Alert, cooperative, no distress, appears stated age. Chronically ill Head: Normocephalic, without obvious abnormality, atraumatic. Eyes: Conjunctivae clear, anicteric sclerae. Pupils are equal ENT Nares normal. No drainage or sinus tenderness. Lips, mucosa, and tongue normal. No Thrush, pale Neck: Supple, symmetrical, no adenopathy, thyroid: non tender no carotid bruit and no JVD. Back: No CVA tenderness. Lungs: b/l air entry Heart:s1s2 Abdomen: Soft,t distended. Bowel sounds normal. No masses Extremities: atraumatic, no cyanosis. No edema. No clubbing Skin: No rashes or lesions. Or bruising Lymph: Cervical, supraclavicular normal. Neurologic: Grossly non-focal Pertinent Labs Lab Results CBC    Component Value Date/Time   WBC 13.4 (H) 11/03/2020 0349   RBC 3.26 (L) 11/03/2020 0349   HGB 8.6 (L) 11/03/2020 0349   HGB 10.6 (L) 09/23/2014 1741   HCT 26.6 (L) 11/03/2020 0349   HCT 36.2 09/23/2014 1741   PLT  140 (L) 11/03/2020 0349   PLT 342 09/23/2014 1741   MCV 81.6 11/03/2020 0349   MCV 80 09/23/2014 1741   MCH 26.4 11/03/2020 0349   MCHC 32.3 11/03/2020 0349   RDW 15.1 11/03/2020 0349   RDW 18.3 (H) 09/23/2014 1741   LYMPHSABS 0.7 08/08/2020 1303   LYMPHSABS 2.6 11/20/2012 1129   MONOABS 0.6 08/08/2020 1303   MONOABS 0.9 11/20/2012 1129   EOSABS 0.2 08/08/2020 1303   EOSABS 0.3 11/20/2012 1129   BASOSABS 0.1 08/08/2020 1303   BASOSABS 0.1 11/20/2012 1129    CMP Latest Ref Rng & Units 11/03/2020 11/02/2020 08/08/2020  Glucose 70 - 99 mg/dL 156(H) 98 103(H)  BUN 6 - 20 mg/dL 34(H) 54(H) 123(H)  Creatinine 0.44 - 1.00 mg/dL 5.21(H) 7.31(H) 15.53(H)  Sodium 135 - 145 mmol/L 134(L) 131(L)  137  Potassium 3.5 - 5.1 mmol/L 4.2 5.3(H) 5.8(H)  Chloride 98 - 111 mmol/L 94(L) 90(L) 101  CO2 22 - 32 mmol/L 26 21(L) 19(L)  Calcium 8.9 - 10.3 mg/dL 8.7(L) 9.2 8.7(L)  Total Protein 6.5 - 8.1 g/dL 7.0 8.1 7.6  Total Bilirubin 0.3 - 1.2 mg/dL 1.0 1.3(H) 1.0  Alkaline Phos 38 - 126 U/L 129(H) 146(H) 176(H)  AST 15 - 41 U/L 133(H) 74(H) 13(L)  ALT 0 - 44 U/L 86(H) 50(H) 11      Microbiology: Recent Results (from the past 240 hour(s))  Respiratory Panel by RT PCR (Flu A&B, Covid) - Nasopharyngeal Swab     Status: None   Collection Time: 11/02/20  3:32 PM   Specimen: Nasopharyngeal Swab  Result Value Ref Range Status   SARS Coronavirus 2 by RT PCR NEGATIVE NEGATIVE Final    Comment: (NOTE) SARS-CoV-2 target nucleic acids are NOT DETECTED.  The SARS-CoV-2 RNA is generally detectable in upper respiratoy specimens during the acute phase of infection. The lowest concentration of SARS-CoV-2 viral copies this assay can detect is 131 copies/mL. A negative result does not preclude SARS-Cov-2 infection and should not be used as the sole basis for treatment or other patient management decisions. A negative result may occur with  improper specimen collection/handling, submission of specimen  other than nasopharyngeal swab, presence of viral mutation(s) within the areas targeted by this assay, and inadequate number of viral copies (<131 copies/mL). A negative result must be combined with clinical observations, patient history, and epidemiological information. The expected result is Negative.  Fact Sheet for Patients:  PinkCheek.be  Fact Sheet for Healthcare Providers:  GravelBags.it  This test is no t yet approved or cleared by the [place] FDA and  has been authorized for detection and/or diagnosis of SARS-CoV-2 by FDA under an Emergency Use Authorization (EUA). This EUA will remain  in effect (meaning this test can be used) for the duration of the COVID-19 declaration under Section 564(b)(1) of the Act, 21 U.S.C. section 360bbb-3(b)(1), unless the authorization is terminated or revoked sooner.     Influenza A by PCR NEGATIVE NEGATIVE Final   Influenza B by PCR NEGATIVE NEGATIVE Final    Comment: (NOTE) The Xpert Xpress SARS-CoV-2/FLU/RSV assay is intended as an aid in  the diagnosis of influenza from Nasopharyngeal swab specimens and  should not be used as a sole basis for treatment. Nasal washings and  aspirates are unacceptable for Xpert Xpress SARS-CoV-2/FLU/RSV  testing.  Fact Sheet for Patients: PinkCheek.be  Fact Sheet for Healthcare Providers: GravelBags.it  This test is not yet approved or cleared by the [place] FDA and  has been authorized for detection and/or diagnosis of SARS-CoV-2 by  FDA under an Emergency Use Authorization (EUA). This EUA will remain  in effect (meaning this test can be used) for the duration of the  Covid-19 declaration under Section 564(b)(1) of the Act, 21  U.S.C. section 360bbb-3(b)(1), unless the authorization is  terminated or revoked. Performed at [facility], [address]  [place], [place] [address]   Blood Culture (routine x 2)     Status: None (Preliminary result)   Collection Time: 11/02/20  4:44 PM   Specimen: BLOOD  Result Value Ref Range Status   Specimen Description BLOOD RIGHT ANTECUBITAL  Final   Special Requests   Final    BOTTLES DRAWN AEROBIC AND ANAEROBIC Blood Culture results may not be optimal due to an inadequate volume of blood received in culture  bottles   Culture  Setup Time   Final    GRAM POSITIVE COCCI IN BOTH AEROBIC AND ANAEROBIC BOTTLES Organism ID to follow CRITICAL RESULT CALLED TO, READ BACK BY AND VERIFIED WITH: JASON ROBBINS AT 0814 11/03/20 SDR Performed at [facility], [address] [place], [place] [address]    Culture GRAM POSITIVE COCCI  Final   Report Status PENDING  Incomplete  Blood Culture ID Panel (Reflexed)     Status: Abnormal   Collection Time: 11/02/20  4:44 PM  Result Value Ref Range Status   Enterococcus faecalis DETECTED (A) NOT DETECTED Final    Comment: CRITICAL RESULT CALLED TO, READ BACK BY AND VERIFIED WITH: JASON ROBBONS AT 6314 11/03/20 SDR    Enterococcus Faecium NOT DETECTED NOT DETECTED Final   Listeria monocytogenes NOT DETECTED NOT DETECTED Final   Staphylococcus species NOT DETECTED NOT DETECTED Final   Staphylococcus aureus (BCID) NOT DETECTED NOT DETECTED Final   Staphylococcus epidermidis NOT DETECTED NOT DETECTED Final   Staphylococcus lugdunensis NOT DETECTED NOT DETECTED Final   Streptococcus species NOT DETECTED NOT DETECTED Final   Streptococcus agalactiae NOT DETECTED NOT DETECTED Final   Streptococcus pneumoniae NOT DETECTED NOT DETECTED Final   Streptococcus pyogenes NOT DETECTED NOT DETECTED Final   A.calcoaceticus-baumannii NOT DETECTED NOT DETECTED Final   Bacteroides fragilis NOT DETECTED NOT DETECTED Final   Enterobacterales NOT DETECTED NOT DETECTED Final   Enterobacter cloacae complex NOT DETECTED NOT DETECTED Final   Escherichia coli NOT DETECTED NOT DETECTED  Final   Klebsiella aerogenes NOT DETECTED NOT DETECTED Final   Klebsiella oxytoca NOT DETECTED NOT DETECTED Final   Klebsiella pneumoniae NOT DETECTED NOT DETECTED Final   Proteus species NOT DETECTED NOT DETECTED Final   Salmonella species NOT DETECTED NOT DETECTED Final   Serratia marcescens NOT DETECTED NOT DETECTED Final   Haemophilus influenzae NOT DETECTED NOT DETECTED Final   Neisseria meningitidis NOT DETECTED NOT DETECTED Final   Pseudomonas aeruginosa NOT DETECTED NOT DETECTED Final   Stenotrophomonas maltophilia NOT DETECTED NOT DETECTED Final   Candida albicans NOT DETECTED NOT DETECTED Final   Candida auris NOT DETECTED NOT DETECTED Final   Candida glabrata NOT DETECTED NOT DETECTED Final   Candida krusei NOT DETECTED NOT DETECTED Final   Candida parapsilosis NOT DETECTED NOT DETECTED Final   Candida tropicalis NOT DETECTED NOT DETECTED Final   Cryptococcus neoformans/gattii NOT DETECTED NOT DETECTED Final   Vancomycin resistance NOT DETECTED NOT DETECTED Final    Comment: Performed at [facility], [address] [place], [place] [address]  Blood Culture (routine x 2)     Status: None (Preliminary result)   Collection Time: 11/02/20  4:49 PM   Specimen: BLOOD  Result Value Ref Range Status   Specimen Description BLOOD BLOOD RIGHT WRIST  Final   Special Requests   Final    BOTTLES DRAWN AEROBIC AND ANAEROBIC Blood Culture results may not be optimal due to an excessive volume of blood received in culture bottles   Culture  Setup Time   Final    GRAM POSITIVE COCCI IN BOTH AEROBIC AND ANAEROBIC BOTTLES CRITICAL VALUE NOTED.  VALUE IS CONSISTENT WITH PREVIOUSLY REPORTED AND CALLED VALUE. Performed at [facility], [address] [place], [place] [address]    Culture [facility] POSITIVE COCCI  Final   Report Status PENDING  Incomplete    IMAGING RESULTS:  I have personally reviewed the films ?Cardiomegaly with interstitial  edema.  Impression/Recommendation ?Enterococcus bacteremia In a  patient on dialysis through catheter.  Very likely the catheter is the source.  Catheter has been removed.  A TEE is required after initial 2D echo.  Repeat blood cultures after removal of catheter for clearance of bacteremia.  She is currently on ampicillin.  We may  add  ceftriaxone   Hypertension.  Was out of meds for 2 days.  Currently on amlodipine and metoprolol and torsemide.   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 Cell count164 ( 39% N) - no peritonitis   AST > ALT excess alcohol R/o cirrhosis? ___________________________________________________ Discussed with patient,and her nurse Note:  This document was prepared using Dragon voice recognition software and may include unintentional dictation errors.

## 2020-11-03 NOTE — Progress Notes (Signed)
PROGRESS NOTE    Janet Olson  WUJ:[id number] DOB: [birth year] DOA: 11/02/2020 PCP: Remi Haggard, FNP     Brief Narrative:  Janet Olson is a 50 y.o. female with medical history significant for hypertension, chronic daily tobacco use, end-stage renal disease on hemodialysis, anxiety/depression, chronic daily diarrhea, presents to the emergency department via EMS for nonspecific concerns including numbness in extremities and pain, chills. She reports the symptoms started during dialysis.   New events last 24 hours / Subjective: Patient seen post paracentesis.  She denies any abdominal pain on my examination.  States that she feels better and no longer having chills now.  Denies any nausea or vomiting.  Has chronic diarrhea that has not changed in nature.  Assessment & Plan:   Principal Problem:   Sepsis ([place]) Active Problems:   Anemia in chronic kidney disease   COPD (chronic obstructive pulmonary disease) (HCC)   ESRD on dialysis (HCC)   Hyperkalemia   Abdominal ascites   Tobacco use   Right upper quadrant abdominal pain   Mass of ear canal, left   Enterococcus faecalis infection    Sepsis secondary to Enterococcus bacteremia -Sepsis not present on admission - VS with HR 83, RR 18, WBC 11.6, T 98.87F at time of admission. Now with RR in 20s with elevated WBC  -Continue ampicillin -Infectious disease consulted -Right chest tunneled HD cath to be removed per Nephrology   Right upper quadrant abdominal pain -Abdominal ultrasound revealed minimal sludge within gallbladder, minimally nodular hepatic margins, minimal scatted ascites  -Status post paracentesis yielding 3.2 L fluid. Fluid studies pending  -Abdominal pain resolved post paracentesis  ESRD on HD -Nephrology following -Continue torsemide  Essential hypertension -Continue amlodipine, lopressor, torsemide  Anxiety/depression -Continue Cymbalta, hydroxyzine, quetiapine  GERD -Continue Protonix    Left ear canal growth  -Follow-up ENT outpatient   DVT prophylaxis:  heparin injection 5,000 Units Start: 11/02/20 2200 Place TED hose Start: 11/02/20 1734  Code Status: Full Family Communication: No family at bedside Disposition Plan:  Status is: Observation  The patient will require care spanning > 2 midnights and should be moved to inpatient because: Ongoing diagnostic testing needed not appropriate for outpatient work up, IV treatments appropriate due to intensity of illness or inability to take PO and Inpatient level of care appropriate due to severity of illness  Dispo: The patient is from: Home              Anticipated d/c is to: Home              Anticipated d/c date is: 3 days              Patient currently is not medically stable to d/c.    Consultants:   Nephrology  Vascular surgery  ID  Procedures:   Paracentesis 11/4 with 3.2L fluid removal   Antimicrobials:  Anti-infectives (From admission, onward)   Start     Dose/Rate Route Frequency Ordered Stop   11/03/20 0830  ampicillin (OMNIPEN) 2 g in sodium chloride 0.9 % 100 mL IVPB        2 g 300 mL/hr over 20 Minutes Intravenous Every 12 hours 11/03/20 0807     11/03/20 0800  ampicillin (OMNIPEN) 2 g in sodium chloride 0.9 % 100 mL IVPB  Status:  Discontinued        2 g 300 mL/hr over 20 Minutes Intravenous Every 4 hours 11/03/20 0547 11/03/20 0807   11/02/20 1645  vancomycin (VANCOCIN) IVPB  1000 mg/200 mL premix  Status:  Discontinued        1,000 mg 200 mL/hr over 60 Minutes Intravenous  Once 11/02/20 1644 11/03/20 0545   11/02/20 1645  ceFEPIme (MAXIPIME) 2 g in sodium chloride 0.9 % 100 mL IVPB        2 g 200 mL/hr over 30 Minutes Intravenous  Once 11/02/20 1644 11/02/20 1821   11/02/20 1645  azithromycin (ZITHROMAX) 500 mg in sodium chloride 0.9 % 250 mL IVPB  Status:  Discontinued        500 mg 250 mL/hr over 60 Minutes Intravenous  Once 11/02/20 1644 11/03/20 0546         Objective: Vitals:   11/03/20 0812 11/03/20 0917 11/03/20 1015 11/03/20 1201  BP: 124/74 128/72 121/72 116/78  Pulse: 85 89 85 75  Resp: 16   16  Temp: 98.3 F (36.8 C)   98.6 F (37 C)  TempSrc: Oral   Oral  SpO2: 98% 90% 98% 100%  Weight:      Height:        Intake/Output Summary (Last 24 hours) at 11/03/2020 1242 Last data filed at 11/02/2020 2145 Gross per 24 hour  Intake 40.99 ml  Output 3000 ml  Net -2959.01 ml   Filed Weights   11/02/20 1224  Weight: 82.2 kg    Examination:  General exam: Appears calm and comfortable  ENT: right TM normal, left TM normal, left ear canal with small growth without drainage  Respiratory system: Clear to auscultation. Respiratory effort normal. No respiratory distress. No conversational dyspnea.  Cardiovascular system: S1 & S2 heard, RRR. No murmurs. No pedal edema. Gastrointestinal system: Abdomen is nondistended, soft and nontender. Normal bowel sounds heard. Central nervous system: Alert and oriented. No focal neurological deficits. Speech clear.  Extremities: Symmetric in appearance  Skin: No rashes, lesions or ulcers on exposed skin  Psychiatry: Judgement and insight appear normal. Mood & affect appropriate.   Data Reviewed: I have personally reviewed following labs and imaging studies  CBC: Recent Labs  Lab 11/02/20 1230 11/03/20 0349  WBC 11.6* 13.4*  HGB 9.6* 8.6*  HCT 30.4* 26.6*  MCV 83.7 81.6  PLT 175 998*   Basic Metabolic Panel: Recent Labs  Lab 11/02/20 1230 11/03/20 0349  NA 131* 134*  K 5.3* 4.2  CL 90* 94*  CO2 21* 26  GLUCOSE 98 156*  BUN 54* 34*  CREATININE 7.31* 5.21*  CALCIUM 9.2 8.7*  MG 1.6*  --    GFR: Estimated Creatinine Clearance: 13.7 mL/min (A) (by C-G formula based on SCr of 5.21 mg/dL (H)). Liver Function Tests: Recent Labs  Lab 11/02/20 1236 11/03/20 0349  AST 74* 133*  ALT 50* 86*  ALKPHOS 146* 129*  BILITOT 1.3* 1.0  PROT 8.1 7.0  ALBUMIN 3.4* 3.0*   No  results for input(s): LIPASE, AMYLASE in the last 168 hours. No results for input(s): AMMONIA in the last 168 hours. Coagulation Profile: Recent Labs  Lab 11/02/20 1644 11/03/20 0349  INR 1.5* 1.4*   Cardiac Enzymes: No results for input(s): CKTOTAL, CKMB, CKMBINDEX, TROPONINI in the last 168 hours. BNP (last 3 results) No results for input(s): PROBNP in the last 8760 hours. HbA1C: No results for input(s): HGBA1C in the last 72 hours. CBG: Recent Labs  Lab 11/02/20 1555 11/02/20 1631  GLUCAP 60* 80   Lipid Profile: No results for input(s): CHOL, HDL, LDLCALC, TRIG, CHOLHDL, LDLDIRECT in the last 72 hours. Thyroid Function Tests: Recent Labs  11/02/20 1237  TSH 1.406   Anemia Panel: Recent Labs    11/03/20 0349  VITAMINB12 853   Sepsis Labs: Recent Labs  Lab 11/02/20 1209 11/02/20 1611 11/02/20 1739  PROCALCITON 53.11  --   --   LATICACIDVEN  --  3.1* 1.9    Recent Results (from the past 240 hour(s))  Respiratory Panel by RT PCR (Flu A&B, Covid) - Nasopharyngeal Swab     Status: None   Collection Time: 11/02/20  3:32 PM   Specimen: Nasopharyngeal Swab  Result Value Ref Range Status   SARS Coronavirus 2 by RT PCR NEGATIVE NEGATIVE Final    Comment: (NOTE) SARS-CoV-2 target nucleic acids are NOT DETECTED.  The SARS-CoV-2 RNA is generally detectable in upper respiratoy specimens during the acute phase of infection. The lowest concentration of SARS-CoV-2 viral copies this assay can detect is 131 copies/mL. A negative result does not preclude SARS-Cov-2 infection and should not be used as the sole basis for treatment or other patient management decisions. A negative result may occur with  improper specimen collection/handling, submission of specimen other than nasopharyngeal swab, presence of viral mutation(s) within the areas targeted by this assay, and inadequate number of viral copies (<131 copies/mL). A negative result must be combined with  clinical observations, patient history, and epidemiological information. The expected result is Negative.  Fact Sheet for Patients:  PinkCheek.be  Fact Sheet for Healthcare Providers:  GravelBags.it  This test is no t yet approved or cleared by the [place] FDA and  has been authorized for detection and/or diagnosis of SARS-CoV-2 by FDA under an Emergency Use Authorization (EUA). This EUA will remain  in effect (meaning this test can be used) for the duration of the COVID-19 declaration under Section 564(b)(1) of the Act, 21 U.S.C. section 360bbb-3(b)(1), unless the authorization is terminated or revoked sooner.     Influenza A by PCR NEGATIVE NEGATIVE Final   Influenza B by PCR NEGATIVE NEGATIVE Final    Comment: (NOTE) The Xpert Xpress SARS-CoV-2/FLU/RSV assay is intended as an aid in  the diagnosis of influenza from Nasopharyngeal swab specimens and  should not be used as a sole basis for treatment. Nasal washings and  aspirates are unacceptable for Xpert Xpress SARS-CoV-2/FLU/RSV  testing.  Fact Sheet for Patients: PinkCheek.be  Fact Sheet for Healthcare Providers: GravelBags.it  This test is not yet approved or cleared by the [place] FDA and  has been authorized for detection and/or diagnosis of SARS-CoV-2 by  FDA under an Emergency Use Authorization (EUA). This EUA will remain  in effect (meaning this test can be used) for the duration of the  Covid-19 declaration under Section 564(b)(1) of the Act, 21  U.S.C. section 360bbb-3(b)(1), unless the authorization is  terminated or revoked. Performed at [facility], [address] [place], [place] [address]   Blood Culture (routine x 2)     Status: None (Preliminary result)   Collection Time: 11/02/20  4:44 PM   Specimen: BLOOD  Result Value Ref Range Status   Specimen Description  BLOOD RIGHT ANTECUBITAL  Final   Special Requests   Final    BOTTLES DRAWN AEROBIC AND ANAEROBIC Blood Culture results may not be optimal due to an inadequate volume of blood received in culture bottles   Culture  Setup Time   Final    GRAM POSITIVE COCCI IN BOTH AEROBIC AND ANAEROBIC BOTTLES Organism ID to follow CRITICAL RESULT CALLED TO, READ BACK BY AND VERIFIED WITH: JASON ROBBINS AT  [id number] 11/03/20 SDR Performed at [facility], [address] [place], [place] [address]    Culture GRAM POSITIVE COCCI  Final   Report Status PENDING  Incomplete  Blood Culture ID Panel (Reflexed)     Status: Abnormal   Collection Time: 11/02/20  4:44 PM  Result Value Ref Range Status   Enterococcus faecalis DETECTED (A) NOT DETECTED Final    Comment: CRITICAL RESULT CALLED TO, READ BACK BY AND VERIFIED WITH: JASON ROBBONS AT 0258 11/03/20 SDR    Enterococcus Faecium NOT DETECTED NOT DETECTED Final   Listeria monocytogenes NOT DETECTED NOT DETECTED Final   Staphylococcus species NOT DETECTED NOT DETECTED Final   Staphylococcus aureus (BCID) NOT DETECTED NOT DETECTED Final   Staphylococcus epidermidis NOT DETECTED NOT DETECTED Final   Staphylococcus lugdunensis NOT DETECTED NOT DETECTED Final   Streptococcus species NOT DETECTED NOT DETECTED Final   Streptococcus agalactiae NOT DETECTED NOT DETECTED Final   Streptococcus pneumoniae NOT DETECTED NOT DETECTED Final   Streptococcus pyogenes NOT DETECTED NOT DETECTED Final   A.calcoaceticus-baumannii NOT DETECTED NOT DETECTED Final   Bacteroides fragilis NOT DETECTED NOT DETECTED Final   Enterobacterales NOT DETECTED NOT DETECTED Final   Enterobacter cloacae complex NOT DETECTED NOT DETECTED Final   Escherichia coli NOT DETECTED NOT DETECTED Final   Klebsiella aerogenes NOT DETECTED NOT DETECTED Final   Klebsiella oxytoca NOT DETECTED NOT DETECTED Final   Klebsiella pneumoniae NOT DETECTED NOT DETECTED Final   Proteus species NOT DETECTED  NOT DETECTED Final   Salmonella species NOT DETECTED NOT DETECTED Final   Serratia marcescens NOT DETECTED NOT DETECTED Final   Haemophilus influenzae NOT DETECTED NOT DETECTED Final   Neisseria meningitidis NOT DETECTED NOT DETECTED Final   Pseudomonas aeruginosa NOT DETECTED NOT DETECTED Final   Stenotrophomonas maltophilia NOT DETECTED NOT DETECTED Final   Candida albicans NOT DETECTED NOT DETECTED Final   Candida auris NOT DETECTED NOT DETECTED Final   Candida glabrata NOT DETECTED NOT DETECTED Final   Candida krusei NOT DETECTED NOT DETECTED Final   Candida parapsilosis NOT DETECTED NOT DETECTED Final   Candida tropicalis NOT DETECTED NOT DETECTED Final   Cryptococcus neoformans/gattii NOT DETECTED NOT DETECTED Final   Vancomycin resistance NOT DETECTED NOT DETECTED Final    Comment: Performed at [facility], [address] [place], [place] [address]  Blood Culture (routine x 2)     Status: None (Preliminary result)   Collection Time: 11/02/20  4:49 PM   Specimen: BLOOD  Result Value Ref Range Status   Specimen Description BLOOD BLOOD RIGHT WRIST  Final   Special Requests   Final    BOTTLES DRAWN AEROBIC AND ANAEROBIC Blood Culture results may not be optimal due to an excessive volume of blood received in culture bottles   Culture  Setup Time   Final    GRAM POSITIVE COCCI IN BOTH AEROBIC AND ANAEROBIC BOTTLES CRITICAL VALUE NOTED.  VALUE IS CONSISTENT WITH PREVIOUSLY REPORTED AND CALLED VALUE. Performed at [facility], [address] [place], [place] [address]    Culture [facility] POSITIVE COCCI  Final   Report Status PENDING  Incomplete      Radiology Studies: DG Chest 1 View  Result Date: 11/02/2020 CLINICAL DATA:  Chills during dialysis EXAM: CHEST  1 VIEW COMPARISON:  03/03/2020 FINDINGS: Single frontal view of the chest demonstrates right internal jugular dialysis catheter tip overlying superior vena cava. Cardiac silhouette remains enlarged. There  is central vascular congestion with mild diffuse interstitial prominence.  No airspace disease, effusion, or pneumothorax. No acute bony abnormality. IMPRESSION: 1. Vascular congestion and mild interstitial edema. 2. Stable enlarged cardiac silhouette. Electronically Signed   By: Randa Ngo M.D.   On: 11/02/2020 15:49   US Abdomen Complete  Result Date: 11/03/2020 CLINICAL DATA:  RIGHT upper quadrant abdominal pain; history end-stage renal disease on dialysis, smoker, COPD, CHF at EXAM: ABDOMEN ULTRASOUND COMPLETE COMPARISON:  11/12/2019 FINDINGS: Gallbladder: Mildly thickened gallbladder wall, a nonspecific finding in the setting of ascites. Small amount of dependent sludge within gallbladder. No shadowing calculi or sonographic Murphy sign. Common bile duct: Diameter: 3 mm, normal Liver: Normal echogenicity without mass. Hepatic margins appear minimally nodular raising question of cirrhosis. Portal vein is patent on color Doppler imaging with normal direction of blood flow towards the liver. IVC: Normal appearance Pancreas: Normal appearance. Pancreatic duct upper normal caliber 2.7 mm. Spleen: Normal appearance, 11.6 cm length. Few collaterals at splenic hilum. Right Kidney: Length: 9.5 cm. Cortical thinning and atrophy. Increased cortical echogenicity. Small cyst at upper pole 14 x 12 x 14 mm. No additional masses or hydronephrosis. Left Kidney: Length: 9.0 cm. Cortical thinning. Increased cortical echogenicity. Tiny cyst at mid upper kidney 6 x 6 x 6 mm. No additional mass or hydronephrosis Abdominal aorta: Normal caliber Other findings: Minimal scattered ascites. IMPRESSION: Cortical thinning and medical renal disease changes of both kidneys with tiny renal cysts. Minimal sludge within gallbladder. Minimally nodular hepatic margins raising question of cirrhosis. Minimal scattered ascites Electronically Signed   By: Lavonia Dana M.D.   On: 11/03/2020 10:58   US Paracentesis  Result Date:  11/03/2020 INDICATION: History of end-stage renal disease, on dialysis with recurrent symptomatic ascites. Please perform ultrasound-guided paracentesis for therapeutic purposes. EXAM: ULTRASOUND-GUIDED PARACENTESIS COMPARISON:  Multiple previous ultrasound-guided paracenteses, most recently 09/20/2020 yielding 4.9 L of peritoneal fluid. MEDICATIONS: None. COMPLICATIONS: None immediate. TECHNIQUE: Informed written consent was obtained from the patient after a discussion of the risks, benefits and alternatives to treatment. A timeout was performed prior to the initiation of the procedure. Initial ultrasound scanning demonstrates a small amount of ascites within the left lower abdomen which was subsequently prepped and draped in the usual sterile fashion. 1% lidocaine with epinephrine was used for local anesthesia. Under direct ultrasound guidance, a 19 gauge, 7-cm, Yueh catheter was introduced. An ultrasound image was saved for documentation purposed. The paracentesis was performed. The catheter was removed and a dressing was applied. The patient tolerated the procedure well without immediate post procedural complication. FINDINGS: A total of approximately 3.2 liters of serous fluid was removed. IMPRESSION: Successful ultrasound-guided paracentesis yielding 3.2 liters of peritoneal fluid. Electronically Signed   By: Sandi Mariscal M.D.   On: 11/03/2020 11:02      Scheduled Meds: . amLODipine  10 mg Oral Daily  . Chlorhexidine Gluconate Cloth  6 each Topical Daily  . DULoxetine  60 mg Oral BID  . heparin  5,000 Units Subcutaneous Q8H  . metoprolol tartrate  50 mg Oral BID  . mometasone-formoterol  2 puff Inhalation BID  . multivitamin  1 tablet Oral Daily  . pantoprazole  40 mg Oral Daily  . sodium chloride flush  3 mL Intravenous Q12H  . torsemide  100 mg Oral Daily  . [START ON 11/06/2020] Vitamin D (Ergocalciferol)  50,000 Units Oral Q Sun   Continuous Infusions: . sodium chloride    . albumin human     . ampicillin (OMNIPEN) IV 2 g (11/03/20 1120)     LOS:  0 days      Time spent: 40 minutes   Dessa Phi, DO [facility] 11/03/2020, 12:42 PM   Available via Epic secure chat 7am-7pm After these hours, please refer to coverage provider listed on [email or web address]

## 2020-11-03 NOTE — Progress Notes (Signed)
PHARMACY - PHYSICIAN COMMUNICATION CRITICAL VALUE ALERT - BLOOD CULTURE IDENTIFICATION (BCID)  Janet Olson is an 50 y.o. female who presented to [facility] on 11/02/2020 with a chief complaint of sepsis.   Assessment:  E Faecalis in 4 of 4 bottles, no resistance.  (include suspected source if known)  Name of physician (or Provider) Contacted:  Rachael Fee   Current antibiotics: Vanc, Azithromycin   Changes to prescribed antibiotics recommended:  Ampicillin 2 gm IV Q4H ordered to start on 11/4 @ 0600  Results for orders placed or performed during the hospital encounter of 11/02/20  Blood Culture ID Panel (Reflexed) (Collected: 11/02/2020  4:44 PM)  Result Value Ref Range   Enterococcus faecalis DETECTED (A) NOT DETECTED   Enterococcus Faecium NOT DETECTED NOT DETECTED   Listeria monocytogenes NOT DETECTED NOT DETECTED   Staphylococcus species NOT DETECTED NOT DETECTED   Staphylococcus aureus (BCID) NOT DETECTED NOT DETECTED   Staphylococcus epidermidis NOT DETECTED NOT DETECTED   Staphylococcus lugdunensis NOT DETECTED NOT DETECTED   Streptococcus species NOT DETECTED NOT DETECTED   Streptococcus agalactiae NOT DETECTED NOT DETECTED   Streptococcus pneumoniae NOT DETECTED NOT DETECTED   Streptococcus pyogenes NOT DETECTED NOT DETECTED   A.calcoaceticus-baumannii NOT DETECTED NOT DETECTED   Bacteroides fragilis NOT DETECTED NOT DETECTED   Enterobacterales NOT DETECTED NOT DETECTED   Enterobacter cloacae complex NOT DETECTED NOT DETECTED   Escherichia coli NOT DETECTED NOT DETECTED   Klebsiella aerogenes NOT DETECTED NOT DETECTED   Klebsiella oxytoca NOT DETECTED NOT DETECTED   Klebsiella pneumoniae NOT DETECTED NOT DETECTED   Proteus species NOT DETECTED NOT DETECTED   Salmonella species NOT DETECTED NOT DETECTED   Serratia marcescens NOT DETECTED NOT DETECTED   Haemophilus influenzae NOT DETECTED NOT DETECTED   Neisseria meningitidis NOT DETECTED NOT DETECTED   Pseudomonas  aeruginosa NOT DETECTED NOT DETECTED   Stenotrophomonas maltophilia NOT DETECTED NOT DETECTED   Candida albicans NOT DETECTED NOT DETECTED   Candida auris NOT DETECTED NOT DETECTED   Candida glabrata NOT DETECTED NOT DETECTED   Candida krusei NOT DETECTED NOT DETECTED   Candida parapsilosis NOT DETECTED NOT DETECTED   Candida tropicalis NOT DETECTED NOT DETECTED   Cryptococcus neoformans/gattii NOT DETECTED NOT DETECTED   Vancomycin resistance NOT DETECTED NOT DETECTED    Gerrald Basu D 11/03/2020  5:48 AM

## 2020-11-03 NOTE — Progress Notes (Signed)
PHARMACY NOTE:  ANTIMICROBIAL RENAL DOSAGE ADJUSTMENT  Current antimicrobial regimen includes a mismatch between antimicrobial dosage and estimated renal function.  As per policy approved by the Pharmacy & Therapeutics and Medical Executive Committees, the antimicrobial dosage will be adjusted accordingly.  Current antimicrobial dosage:  Ampicillin 2 g IV q4h  Indication: Enterococcus bacteremia  Renal Function:  Estimated Creatinine Clearance: 13.7 mL/min (A) (by C-G formula based on SCr of 5.21 mg/dL (H)). [x]      On intermittent HD, scheduled: MWF []      On CRRT    Antimicrobial dosage has been changed to:  Ampicillin 2g IV q12h    Thank you for allowing pharmacy to be a part of this patient's care.  Sherilyn Banker, PharmD Pharmacy Resident  11/03/2020 8:09 AM

## 2020-11-03 NOTE — Progress Notes (Signed)
[facility]  ROUNDING NOTE   Subjective:   Hemodialysis treatment yesterday. Tolerated treatment well. UF of 3 liters.    Objective:  Vital signs in last 24 hours:  Temp:  [98.2 F (36.8 C)-98.9 F (37.2 C)] 98.2 F (36.8 C) (11/04 1544) Pulse Rate:  [75-94] 88 (11/04 1544) Resp:  [16-26] 18 (11/04 1544) BP: (115-144)/(67-80) 115/71 (11/04 1544) SpO2:  [90 %-100 %] 93 % (11/04 1544) Weight:  [77.1 kg] 77.1 kg (11/04 1544)  Weight change:  Filed Weights   11/02/20 1224 11/03/20 1544  Weight: 82.2 kg 77.1 kg    Intake/Output: I/O last 3 completed shifts: In: 29 [IV Piggyback:41] Out: 3000 [Other:3000]   Intake/Output this shift:  Total I/O In: 340 [P.O.:240; IV Piggyback:100] Out: -   Physical Exam: General: NAD  Head: Normocephalic, atraumatic. Moist oral mucosal membranes  Eyes: Anicteric  Neck: trachea midline  Lungs:  Clear   Heart: Regular rate and rhythm  Abdomen:  Soft, nontender   Extremities:  No  peripheral edema.  Neurologic: Oriented x 3, moving all four extremities  Skin: No acute lesions or rashes  Access: LUA AVF +thrill,+bruit, RIJ permcath    Basic Metabolic Panel: Recent Labs  Lab 11/02/20 1230 11/03/20 0349  NA 131* 134*  K 5.3* 4.2  CL 90* 94*  CO2 21* 26  GLUCOSE 98 156*  BUN 54* 34*  CREATININE 7.31* 5.21*  CALCIUM 9.2 8.7*  MG 1.6*  --     Liver Function Tests: Recent Labs  Lab 11/02/20 1236 11/03/20 0349  AST 74* 133*  ALT 50* 86*  ALKPHOS 146* 129*  BILITOT 1.3* 1.0  PROT 8.1 7.0  ALBUMIN 3.4* 3.0*   No results for input(s): LIPASE, AMYLASE in the last 168 hours. No results for input(s): AMMONIA in the last 168 hours.  CBC: Recent Labs  Lab 11/02/20 1230 11/03/20 0349  WBC 11.6* 13.4*  HGB 9.6* 8.6*  HCT 30.4* 26.6*  MCV 83.7 81.6  PLT 175 140*    Cardiac Enzymes: No results for input(s): CKTOTAL, CKMB, CKMBINDEX, TROPONINI in the last 168 hours.  BNP: Invalid input(s):  POCBNP  CBG: Recent Labs  Lab 11/02/20 1555 11/02/20 1631  GLUCAP 60* 80    Microbiology: Results for orders placed or performed during the hospital encounter of 11/02/20  Respiratory Panel by RT PCR (Flu A&B, Covid) - Nasopharyngeal Swab     Status: None   Collection Time: 11/02/20  3:32 PM   Specimen: Nasopharyngeal Swab  Result Value Ref Range Status   SARS Coronavirus 2 by RT PCR NEGATIVE NEGATIVE Final    Comment: (NOTE) SARS-CoV-2 target nucleic acids are NOT DETECTED.  The SARS-CoV-2 RNA is generally detectable in upper respiratoy specimens during the acute phase of infection. The lowest concentration of SARS-CoV-2 viral copies this assay can detect is 131 copies/mL. A negative result does not preclude SARS-Cov-2 infection and should not be used as the sole basis for treatment or other patient management decisions. A negative result may occur with  improper specimen collection/handling, submission of specimen other than nasopharyngeal swab, presence of viral mutation(s) within the areas targeted by this assay, and inadequate number of viral copies (<131 copies/mL). A negative result must be combined with clinical observations, patient history, and epidemiological information. The expected result is Negative.  Fact Sheet for Patients:  PinkCheek.be  Fact Sheet for Healthcare Providers:  GravelBags.it  This test is no t yet approved or cleared by the [place] FDA and  has  been authorized for detection and/or diagnosis of SARS-CoV-2 by FDA under an Emergency Use Authorization (EUA). This EUA will remain  in effect (meaning this test can be used) for the duration of the COVID-19 declaration under Section 564(b)(1) of the Act, 21 U.S.C. section 360bbb-3(b)(1), unless the authorization is terminated or revoked sooner.     Influenza A by PCR NEGATIVE NEGATIVE Final   Influenza B by PCR NEGATIVE NEGATIVE  Final    Comment: (NOTE) The Xpert Xpress SARS-CoV-2/FLU/RSV assay is intended as an aid in  the diagnosis of influenza from Nasopharyngeal swab specimens and  should not be used as a sole basis for treatment. Nasal washings and  aspirates are unacceptable for Xpert Xpress SARS-CoV-2/FLU/RSV  testing.  Fact Sheet for Patients: PinkCheek.be  Fact Sheet for Healthcare Providers: GravelBags.it  This test is not yet approved or cleared by the [place] FDA and  has been authorized for detection and/or diagnosis of SARS-CoV-2 by  FDA under an Emergency Use Authorization (EUA). This EUA will remain  in effect (meaning this test can be used) for the duration of the  Covid-19 declaration under Section 564(b)(1) of the Act, 21  U.S.C. section 360bbb-3(b)(1), unless the authorization is  terminated or revoked. Performed at [facility], [address] [place], [place] [address]   Blood Culture (routine x 2)     Status: None (Preliminary result)   Collection Time: 11/02/20  4:44 PM   Specimen: BLOOD  Result Value Ref Range Status   Specimen Description BLOOD RIGHT ANTECUBITAL  Final   Special Requests   Final    BOTTLES DRAWN AEROBIC AND ANAEROBIC Blood Culture results may not be optimal due to an inadequate volume of blood received in culture bottles   Culture  Setup Time   Final    GRAM POSITIVE COCCI IN BOTH AEROBIC AND ANAEROBIC BOTTLES Organism ID to follow CRITICAL RESULT CALLED TO, READ BACK BY AND VERIFIED WITH: JASON ROBBINS AT 2025 11/03/20 SDR Performed at [facility], [address] [place], [place] [phone]    Culture GRAM POSITIVE COCCI  Final   Report Status PENDING  Incomplete  Blood Culture ID Panel (Reflexed)     Status: Abnormal   Collection Time: 11/02/20  4:44 PM  Result Value Ref Range Status   Enterococcus faecalis DETECTED (A) NOT DETECTED Final    Comment: CRITICAL RESULT  CALLED TO, READ BACK BY AND VERIFIED WITH: JASON ROBBONS AT 2376 11/03/20 SDR    Enterococcus Faecium NOT DETECTED NOT DETECTED Final   Listeria monocytogenes NOT DETECTED NOT DETECTED Final   Staphylococcus species NOT DETECTED NOT DETECTED Final   Staphylococcus aureus (BCID) NOT DETECTED NOT DETECTED Final   Staphylococcus epidermidis NOT DETECTED NOT DETECTED Final   Staphylococcus lugdunensis NOT DETECTED NOT DETECTED Final   Streptococcus species NOT DETECTED NOT DETECTED Final   Streptococcus agalactiae NOT DETECTED NOT DETECTED Final   Streptococcus pneumoniae NOT DETECTED NOT DETECTED Final   Streptococcus pyogenes NOT DETECTED NOT DETECTED Final   A.calcoaceticus-baumannii NOT DETECTED NOT DETECTED Final   Bacteroides fragilis NOT DETECTED NOT DETECTED Final   Enterobacterales NOT DETECTED NOT DETECTED Final   Enterobacter cloacae complex NOT DETECTED NOT DETECTED Final   Escherichia coli NOT DETECTED NOT DETECTED Final   Klebsiella aerogenes NOT DETECTED NOT DETECTED Final   Klebsiella oxytoca NOT DETECTED NOT DETECTED Final   Klebsiella pneumoniae NOT DETECTED NOT DETECTED Final   Proteus species NOT DETECTED NOT DETECTED Final   Salmonella species  NOT DETECTED NOT DETECTED Final   Serratia marcescens NOT DETECTED NOT DETECTED Final   Haemophilus influenzae NOT DETECTED NOT DETECTED Final   Neisseria meningitidis NOT DETECTED NOT DETECTED Final   Pseudomonas aeruginosa NOT DETECTED NOT DETECTED Final   Stenotrophomonas maltophilia NOT DETECTED NOT DETECTED Final   Candida albicans NOT DETECTED NOT DETECTED Final   Candida auris NOT DETECTED NOT DETECTED Final   Candida glabrata NOT DETECTED NOT DETECTED Final   Candida krusei NOT DETECTED NOT DETECTED Final   Candida parapsilosis NOT DETECTED NOT DETECTED Final   Candida tropicalis NOT DETECTED NOT DETECTED Final   Cryptococcus neoformans/gattii NOT DETECTED NOT DETECTED Final   Vancomycin resistance NOT DETECTED NOT  DETECTED Final    Comment: Performed at [facility], [address] [place], [place] [address]  Blood Culture (routine x 2)     Status: None (Preliminary result)   Collection Time: 11/02/20  4:49 PM   Specimen: BLOOD  Result Value Ref Range Status   Specimen Description BLOOD BLOOD RIGHT WRIST  Final   Special Requests   Final    BOTTLES DRAWN AEROBIC AND ANAEROBIC Blood Culture results may not be optimal due to an excessive volume of blood received in culture bottles   Culture  Setup Time   Final    GRAM POSITIVE COCCI IN BOTH AEROBIC AND ANAEROBIC BOTTLES CRITICAL VALUE NOTED.  VALUE IS CONSISTENT WITH PREVIOUSLY REPORTED AND CALLED VALUE. Performed at [facility], [address] [place],  [address]    Culture [facility] POSITIVE COCCI  Final   Report Status PENDING  Incomplete    Coagulation Studies: Recent Labs    11/02/20 1644 11/03/20 0349  LABPROT 17.2* 16.4*  INR 1.5* 1.4*    Urinalysis: No results for input(s): COLORURINE, LABSPEC, PHURINE, GLUCOSEU, HGBUR, BILIRUBINUR, KETONESUR, PROTEINUR, UROBILINOGEN, NITRITE, LEUKOCYTESUR in the last 72 hours.  Invalid input(s): APPERANCEUR    Imaging: DG Chest 1 View  Result Date: 11/02/2020 CLINICAL DATA:  Chills during dialysis EXAM: CHEST  1 VIEW COMPARISON:  03/03/2020 FINDINGS: Single frontal view of the chest demonstrates right internal jugular dialysis catheter tip overlying superior vena cava. Cardiac silhouette remains enlarged. There is central vascular congestion with mild diffuse interstitial prominence. No airspace disease, effusion, or pneumothorax. No acute bony abnormality. IMPRESSION: 1. Vascular congestion and mild interstitial edema. 2. Stable enlarged cardiac silhouette. Electronically Signed   By: Randa Ngo M.D.   On: 11/02/2020 15:49   US Abdomen Complete  Result Date: 11/03/2020 CLINICAL DATA:  RIGHT upper quadrant abdominal pain; history end-stage renal disease on dialysis,  smoker, COPD, CHF at EXAM: ABDOMEN ULTRASOUND COMPLETE COMPARISON:  11/12/2019 FINDINGS: Gallbladder: Mildly thickened gallbladder wall, a nonspecific finding in the setting of ascites. Small amount of dependent sludge within gallbladder. No shadowing calculi or sonographic Murphy sign. Common bile duct: Diameter: 3 mm, normal Liver: Normal echogenicity without mass. Hepatic margins appear minimally nodular raising question of cirrhosis. Portal vein is patent on color Doppler imaging with normal direction of blood flow towards the liver. IVC: Normal appearance Pancreas: Normal appearance. Pancreatic duct upper normal caliber 2.7 mm. Spleen: Normal appearance, 11.6 cm length. Few collaterals at splenic hilum. Right Kidney: Length: 9.5 cm. Cortical thinning and atrophy. Increased cortical echogenicity. Small cyst at upper pole 14 x 12 x 14 mm. No additional masses or hydronephrosis. Left Kidney: Length: 9.0 cm. Cortical thinning. Increased cortical echogenicity. Tiny cyst at mid upper kidney 6 x 6 x 6 mm. No additional mass or  hydronephrosis Abdominal aorta: Normal caliber Other findings: Minimal scattered ascites. IMPRESSION: Cortical thinning and medical renal disease changes of both kidneys with tiny renal cysts. Minimal sludge within gallbladder. Minimally nodular hepatic margins raising question of cirrhosis. Minimal scattered ascites Electronically Signed   By: Lavonia Dana M.D.   On: 11/03/2020 10:58   US Paracentesis  Result Date: 11/03/2020 INDICATION: History of end-stage renal disease, on dialysis with recurrent symptomatic ascites. Please perform ultrasound-guided paracentesis for therapeutic purposes. EXAM: ULTRASOUND-GUIDED PARACENTESIS COMPARISON:  Multiple previous ultrasound-guided paracenteses, most recently 09/20/2020 yielding 4.9 L of peritoneal fluid. MEDICATIONS: None. COMPLICATIONS: None immediate. TECHNIQUE: Informed written consent was obtained from the patient after a discussion of the  risks, benefits and alternatives to treatment. A timeout was performed prior to the initiation of the procedure. Initial ultrasound scanning demonstrates a small amount of ascites within the left lower abdomen which was subsequently prepped and draped in the usual sterile fashion. 1% lidocaine with epinephrine was used for local anesthesia. Under direct ultrasound guidance, a 19 gauge, 7-cm, Yueh catheter was introduced. An ultrasound image was saved for documentation purposed. The paracentesis was performed. The catheter was removed and a dressing was applied. The patient tolerated the procedure well without immediate post procedural complication. FINDINGS: A total of approximately 3.2 liters of serous fluid was removed. IMPRESSION: Successful ultrasound-guided paracentesis yielding 3.2 liters of peritoneal fluid. Electronically Signed   By: Sandi Mariscal M.D.   On: 11/03/2020 11:02     Medications:   . [MAR Hold] sodium chloride    . [MAR Hold] albumin human    . [MAR Hold] ampicillin (OMNIPEN) IV 2 g (11/03/20 1120)   . [MAR Hold] amLODipine  10 mg Oral Daily  . [MAR Hold] Chlorhexidine Gluconate Cloth  6 each Topical Daily  . [MAR Hold] DULoxetine  60 mg Oral BID  . [MAR Hold] heparin  5,000 Units Subcutaneous Q8H  . [MAR Hold] metoprolol tartrate  50 mg Oral BID  . [MAR Hold] mometasone-formoterol  2 puff Inhalation BID  . [MAR Hold] multivitamin  1 tablet Oral Daily  . [MAR Hold] nicotine  21 mg Transdermal Daily  . [MAR Hold] pantoprazole  40 mg Oral Daily  . [MAR Hold] sodium chloride flush  3 mL Intravenous Q12H  . [MAR Hold] torsemide  100 mg Oral Daily  . [MAR Hold] Vitamin D (Ergocalciferol)  50,000 Units Oral Q Sun   [MAR Hold] sodium chloride, [MAR Hold] albumin human, [MAR Hold] albuterol, [MAR Hold] albuterol, [MAR Hold] benzonatate, [MAR Hold] docusate sodium, [MAR Hold] hydrOXYzine, [MAR Hold] oxyCODONE, [MAR Hold] QUEtiapine, [MAR Hold] sodium chloride flush  Assessment/  Plan:  Janet Olson is a 50 y.o.  female known to our practice, has past medical history of diastolic heart failure,anemia, COPD,hypertension and ESRD on hemodialysis MWF. She presented to the ED with c/o chills and SOB x 1 day.She went to her dialysis center today,3 minutes into the treatment, she reported worsening chills and was sent to ED.  #ESRD on dialysis MWF Plan to continue MWF schedule - tunneled catheter to be removed later today. Appreciate vascular input.   #Hypertension Regimen of Amlodipine,Metorpolol and Torsemide  # Secondary Hyperparathyroidism - calcium acetate with meals.   #Anemia of CKD EPO with HD treatments.     LOS: 0 Estevon Fluke 11/4/20213:47 PM

## 2020-11-03 NOTE — H&P (Signed)
[facility] Admission History & Physical  MRN : [id number]  Janet Olson is a 50 y.o. (1970-04-19) female who presents with chief complaint of  Chief Complaint  Patient presents with  . Weakness   History of Present Illness:   I am asked to evaluate the patient by her nephrologist. Vascular surgery was consulted for PermCath removal as the patient has a functioning upper extremity dialysis access. The patient reports they're not been any problems with any of their dialysis runs. They are reporting good flows with good parameters at dialysis. Patient denies pain or tenderness overlying the access.  There is no pain with dialysis.  The patient denies hand pain or finger pain consistent with steal syndrome.  No fevers or chills while on dialysis.  Current Facility-Administered Medications  Medication Dose Route Frequency Provider Last Rate Last Admin  . [MAR Hold] 0.9 %  sodium chloride infusion  250 mL Intravenous PRN Cox, Amy N, DO      . [MAR Hold] albumin human 25 % solution 25 g  25 g Intravenous Once PRN Cox, Amy N, DO      . [MAR Hold] albuterol (PROVENTIL) (2.5 MG/3ML) 0.083% nebulizer solution 2.5 mg  2.5 mg Nebulization Q6H PRN Cox, Amy N, DO      . [MAR Hold] albuterol (VENTOLIN HFA) 108 (90 Base) MCG/ACT inhaler 2 puff  2 puff Inhalation Q6H PRN Cox, Amy N, DO      . [MAR Hold] amLODipine (NORVASC) tablet 10 mg  10 mg Oral Daily Cox, Amy N, DO      . [MAR Hold] ampicillin (OMNIPEN) 2 g in sodium chloride 0.9 % 100 mL IVPB  2 g Intravenous Q12H Rauer, Samantha O, RPH 300 mL/hr at 11/03/20 1120 2 g at 11/03/20 1120  . [MAR Hold] benzonatate (TESSALON) capsule 100 mg  100 mg Oral BID PRN Cox, Amy N, DO      . [MAR Hold] Chlorhexidine Gluconate Cloth 2 % PADS 6 each  6 each Topical Daily Lucrezia Starch, MD   6 each at 11/03/20 1100  . [MAR Hold] docusate sodium (COLACE) capsule 100 mg  100 mg Oral Daily PRN Cox, Amy N, DO      . [MAR Hold] DULoxetine  (CYMBALTA) DR capsule 60 mg  60 mg Oral BID Cox, Amy N, DO   60 mg at 11/03/20 1101  . [MAR Hold] heparin injection 5,000 Units  5,000 Units Subcutaneous Q8H Cox, Amy N, DO   5,000 Units at 11/02/20 2313  . [MAR Hold] hydrOXYzine (ATARAX/VISTARIL) tablet 25 mg  25 mg Oral Q8H PRN Cox, Amy N, DO   25 mg at 11/03/20 1100  . [MAR Hold] metoprolol tartrate (LOPRESSOR) tablet 50 mg  50 mg Oral BID Cox, Amy N, DO   50 mg at 11/03/20 1100  . [MAR Hold] mometasone-formoterol (DULERA) 200-5 MCG/ACT inhaler 2 puff  2 puff Inhalation BID Cox, Amy N, DO   2 puff at 11/03/20 1106  . [MAR Hold] multivitamin (RENA-VIT) tablet 1 tablet  1 tablet Oral Daily Cox, Amy N, DO   1 tablet at 11/03/20 1102  . [MAR Hold] nicotine (NICODERM CQ - dosed in mg/24 hours) patch 21 mg  21 mg Transdermal Daily Dessa Phi, DO      . [MAR Hold] oxyCODONE (Oxy IR/ROXICODONE) immediate release tablet 5 mg  5 mg Oral Q6H PRN Cox, Amy N, DO   5 mg at 11/03/20 1100  . [MAR Hold] pantoprazole (PROTONIX)  EC tablet 40 mg  40 mg Oral Daily Cox, Amy N, DO   40 mg at 11/03/20 1100  . [MAR Hold] QUEtiapine (SEROQUEL) tablet 25 mg  25 mg Oral QHS PRN Cox, Amy N, DO   25 mg at 11/03/20 0259  . [MAR Hold] sodium chloride flush (NS) 0.9 % injection 3 mL  3 mL Intravenous Q12H Cox, Amy N, DO   3 mL at 11/03/20 1105  . [MAR Hold] sodium chloride flush (NS) 0.9 % injection 3 mL  3 mL Intravenous PRN Cox, Amy N, DO      . [MAR Hold] torsemide (DEMADEX) tablet 100 mg  100 mg Oral Daily Cox, Amy N, DO   100 mg at 11/03/20 1101  . [MAR Hold] Vitamin D (Ergocalciferol) (DRISDOL) capsule 50,000 Units  50,000 Units Oral Q Sun Cox, Amy N, DO       Past Medical History:  Diagnosis Date  . (HFpEF) heart failure with preserved ejection fraction ([place])    a. 2015 Echo: Nl EF. Mild to mod LVH. Mild PR/MR; b. 04/2017 Echo: EF 55-60%, no rwma, Gr1 DD. Mildly dil LA. Small effusion; c. 12/2018 Echo: EF 55-60%, mildly dil LA/PA. Mod dil RA. Mild to mod RV dil. Mildly  reduced RV fxn. Mild TR. Mod elev PASP.  Marland Kitchen Abdominal ascites   . Acute hypoxemic respiratory failure ([place]) 01/03/2019  . Acute renal failure (ARF) ([place]) 05/17/2017  . Acute respiratory failure ([place]) 09/25/2018  . Anemia   . Anxiety   . Arthritis   . Asthma   . Cardiomegaly   . COPD (chronic obstructive pulmonary disease) ([place])   . Depression   . Dialysis patient ([place])    MON, WED , FRI  . Dyspnea   . ESRD (end stage renal disease) ([place])   . GERD (gastroesophageal reflux disease)   . Headache    Migraines  . Hypertension   . Nonrheumatic tricuspid (valve) insufficiency   . Respiratory failure with hypoxia ([place]) 04/21/2018  . Ulcer    gastric  . Vitamin D deficiency    Past Surgical History:  Procedure Laterality Date  . A/V FISTULAGRAM Left 04/17/2018   Procedure: A/V FISTULAGRAM;  Surgeon: Algernon Huxley, MD;  Location: [facility];  Service: Cardiovascular;  Laterality: Left;  . A/V FISTULAGRAM Left 05/27/2018   Procedure: A/V FISTULAGRAM;  Surgeon: Katha Cabal, MD;  Location: [facility];  Service: Cardiovascular;  Laterality: Left;  . A/V FISTULAGRAM Left 07/05/2020   Procedure: A/V FISTULAGRAM;  Surgeon: Katha Cabal, MD;  Location: [facility];  Service: Cardiovascular;  Laterality: Left;  . AV FISTULA PLACEMENT Left 08/08/2017   Procedure: ARTERIOVENOUS (AV) FISTULA CREATION ( BRACHIOCEPHALIC );  Surgeon: Algernon Huxley, MD;  Location: [facility];  Service: Vascular;  Laterality: Left;  . AV FISTULA PLACEMENT Left 08/05/2020   Procedure: INSERTION OF ARTERIOVENOUS (AV) GRAFT ARM(BRACHIAL AXILLARY);  Surgeon: Katha Cabal, MD;  Location: [facility];  Service: Vascular;  Laterality: Left;  . CESAREAN SECTION     x 4 (1988, 1991, 1997, 2000 )  . COLONOSCOPY WITH PROPOFOL N/A 04/24/2018   Procedure: COLONOSCOPY WITH PROPOFOL;  Surgeon: Lin Landsman, MD;  [facility];  Service: Gastroenterology;  Laterality: N/A;  . DIALYSIS/PERMA  CATHETER INSERTION N/A 07/16/2017   Procedure: Dialysis/Perma Catheter Insertion;  Surgeon: Katha Cabal, MD;  Location: [facility];  Service: Cardiovascular;  Laterality: N/A;  . DIALYSIS/PERMA CATHETER REMOVAL N/A 10/29/2017  Procedure: DIALYSIS/PERMA CATHETER REMOVAL;  Surgeon: Katha Cabal, MD;  Location: [facility];  Service: Cardiovascular;  Laterality: N/A;  . excision of lymph nodes Bilateral 2014   bilateral under arms.  Marland Kitchen PARATHYROID IMPLANT REMOVAL    . REMOVAL OF A DIALYSIS CATHETER Left 08/05/2020   Procedure: REMOVAL OF A DIALYSIS CATHETER ( FISTULA );  Surgeon: Katha Cabal, MD;  Location: [facility];  Service: Vascular;  Laterality: Left;   Social History Social History   Tobacco Use  . Smoking status: Current Every Day Smoker    Packs/day: 0.50    Years: 30.00    Pack years: 15.00    Types: Cigarettes  . Smokeless tobacco: Never Used  Vaping Use  . Vaping Use: Never used  Substance Use Topics  . Alcohol use: No  . Drug use: No   Family History Family History  Problem Relation Age of Onset  . Diabetes Mother   No family history of bleeding or clotting disorders, autoimmune disease or porphyria.  No Known Allergies  REVIEW OF SYSTEMS (Negative unless checked)  Constitutional: [] Weight loss  [] Fever  [] Chills Cardiac: [] Chest pain   [] Chest pressure   [] Palpitations   [] Shortness of breath when laying flat   [] Shortness of breath at rest   [x] Shortness of breath with exertion. Vascular:  [] Pain in legs with walking   [] Pain in legs at rest   [] Pain in legs when laying flat   [] Claudication   [] Pain in feet when walking  [] Pain in feet at rest  [] Pain in feet when laying flat   [] History of DVT   [] Phlebitis   [] Swelling in legs   [] Varicose veins   [] Non-healing ulcers Pulmonary:   [] Uses home oxygen   [] Productive cough   [] Hemoptysis   [] Wheeze  [] COPD   [] Asthma Neurologic:  [] Dizziness  [] Blackouts   [] Seizures   [] History of  stroke   [] History of TIA  [] Aphasia   [] Temporary blindness   [] Dysphagia   [] Weakness or numbness in arms   [] Weakness or numbness in legs Musculoskeletal:  [] Arthritis   [] Joint swelling   [] Joint pain   [] Low back pain Hematologic:  [] Easy bruising  [] Easy bleeding   [] Hypercoagulable state   [] Anemic  [] Hepatitis Gastrointestinal:  [] Blood in stool   [] Vomiting blood  [] Gastroesophageal reflux/heartburn   [] Difficulty swallowing. Genitourinary:  [x] Chronic kidney disease   [] Difficult urination  [] Frequent urination  [] Burning with urination   [] Blood in urine Skin:  [] Rashes   [] Ulcers   [] Wounds Psychological:  [] History of anxiety   []  History of major depression.  Physical Examination  Vitals:   11/03/20 0917 11/03/20 1015 11/03/20 1201 11/03/20 1544  BP: 128/72 121/72 116/78 115/71  Pulse: 89 85 75 88  Resp:   16 18  Temp:   98.6 F (37 C) 98.2 F (36.8 C)  TempSrc:   Oral Oral  SpO2: 90% 98% 100% 93%  Weight:    77.1 kg  Height:    5\' 6"  (4.696 m)   Body mass index is 27.44 kg/m. Gen: WD/WN, NAD Head: [place]/AT, No temporalis wasting. Prominent temp pulse not noted. Ear/Nose/Throat: Hearing grossly intact, nares w/o erythema or drainage, oropharynx w/o Erythema/Exudate,  Eyes: Conjunctiva clear, sclera non-icteric Neck: Trachea midline.  No JVD.  Pulmonary:  Good air movement, respirations not labored, no use of accessory muscles.  Cardiac: RRR, normal S1, S2. Vascular:  Vessel Right Left  Radial Palpable Palpable  Ulnar Not Palpable Not Palpable  Brachial Palpable Palpable  Carotid Palpable, without bruit Palpable, without bruit  Gastrointestinal: soft, non-tender/non-distended. No guarding/reflex.  Musculoskeletal: M/S 5/5 throughout.  Extremities without ischemic changes.  No deformity or atrophy.  Neurologic: Sensation grossly intact in extremities.  Symmetrical.  Speech is fluent. Motor exam as listed above. Psychiatric: Judgment intact, Mood & affect appropriate  for pt's clinical situation. Dermatologic: No rashes or ulcers noted.  No cellulitis or open wounds. Lymph : No Cervical, Axillary, or Inguinal lymphadenopathy.  CBC Lab Results  Component Value Date   WBC 13.4 (H) 11/03/2020   HGB 8.6 (L) 11/03/2020   HCT 26.6 (L) 11/03/2020   MCV 81.6 11/03/2020   PLT 140 (L) 11/03/2020   BMET    Component Value Date/Time   NA 134 (L) 11/03/2020 0349   NA 139 09/23/2014 1741   K 4.2 11/03/2020 0349   K 4.2 09/23/2014 1741   CL 94 (L) 11/03/2020 0349   CL 112 (H) 09/23/2014 1741   CO2 26 11/03/2020 0349   CO2 21 09/23/2014 1741   GLUCOSE 156 (H) 11/03/2020 0349   GLUCOSE 82 09/23/2014 1741   BUN 34 (H) 11/03/2020 0349   BUN 18 09/23/2014 1741   CREATININE 5.21 (H) 11/03/2020 0349   CREATININE 1.41 (H) 09/23/2014 1741   CALCIUM 8.7 (L) 11/03/2020 0349   CALCIUM 8.4 (L) 01/03/2019 0243   GFRNONAA 9 (L) 11/03/2020 0349   GFRNONAA 43 (L) 09/23/2014 1741   GFRNONAA 43 (L) 11/20/2012 1129   GFRAA 3 (L) 08/08/2020 1303   GFRAA 52 (L) 09/23/2014 1741   GFRAA 49 (L) 11/20/2012 1129   Estimated Creatinine Clearance: 13.2 mL/min (A) (by C-G formula based on SCr of 5.21 mg/dL (H)).  COAG Lab Results  Component Value Date   INR 1.4 (H) 11/03/2020   INR 1.5 (H) 11/02/2020   INR 1.2 08/02/2020   Radiology DG Chest 1 View  Result Date: 11/02/2020 CLINICAL DATA:  Chills during dialysis EXAM: CHEST  1 VIEW COMPARISON:  03/03/2020 FINDINGS: Single frontal view of the chest demonstrates right internal jugular dialysis catheter tip overlying superior vena cava. Cardiac silhouette remains enlarged. There is central vascular congestion with mild diffuse interstitial prominence. No airspace disease, effusion, or pneumothorax. No acute bony abnormality. IMPRESSION: 1. Vascular congestion and mild interstitial edema. 2. Stable enlarged cardiac silhouette. Electronically Signed   By: Randa Ngo M.D.   On: 11/02/2020 15:49   US Abdomen  Complete  Result Date: 11/03/2020 CLINICAL DATA:  RIGHT upper quadrant abdominal pain; history end-stage renal disease on dialysis, smoker, COPD, CHF at EXAM: ABDOMEN ULTRASOUND COMPLETE COMPARISON:  11/12/2019 FINDINGS: Gallbladder: Mildly thickened gallbladder wall, a nonspecific finding in the setting of ascites. Small amount of dependent sludge within gallbladder. No shadowing calculi or sonographic Murphy sign. Common bile duct: Diameter: 3 mm, normal Liver: Normal echogenicity without mass. Hepatic margins appear minimally nodular raising question of cirrhosis. Portal vein is patent on color Doppler imaging with normal direction of blood flow towards the liver. IVC: Normal appearance Pancreas: Normal appearance. Pancreatic duct upper normal caliber 2.7 mm. Spleen: Normal appearance, 11.6 cm length. Few collaterals at splenic hilum. Right Kidney: Length: 9.5 cm. Cortical thinning and atrophy. Increased cortical echogenicity. Small cyst at upper pole 14 x 12 x 14 mm. No additional masses or hydronephrosis. Left Kidney: Length: 9.0 cm. Cortical thinning. Increased cortical echogenicity. Tiny cyst at mid upper kidney 6 x 6 x 6 mm. No additional mass or hydronephrosis Abdominal aorta: Normal caliber Other findings: Minimal scattered  ascites. IMPRESSION: Cortical thinning and medical renal disease changes of both kidneys with tiny renal cysts. Minimal sludge within gallbladder. Minimally nodular hepatic margins raising question of cirrhosis. Minimal scattered ascites Electronically Signed   By: Lavonia Dana M.D.   On: 11/03/2020 10:58   US Paracentesis  Result Date: 11/03/2020 INDICATION: History of end-stage renal disease, on dialysis with recurrent symptomatic ascites. Please perform ultrasound-guided paracentesis for therapeutic purposes. EXAM: ULTRASOUND-GUIDED PARACENTESIS COMPARISON:  Multiple previous ultrasound-guided paracenteses, most recently 09/20/2020 yielding 4.9 L of peritoneal fluid.  MEDICATIONS: None. COMPLICATIONS: None immediate. TECHNIQUE: Informed written consent was obtained from the patient after a discussion of the risks, benefits and alternatives to treatment. A timeout was performed prior to the initiation of the procedure. Initial ultrasound scanning demonstrates a small amount of ascites within the left lower abdomen which was subsequently prepped and draped in the usual sterile fashion. 1% lidocaine with epinephrine was used for local anesthesia. Under direct ultrasound guidance, a 19 gauge, 7-cm, Yueh catheter was introduced. An ultrasound image was saved for documentation purposed. The paracentesis was performed. The catheter was removed and a dressing was applied. The patient tolerated the procedure well without immediate post procedural complication. FINDINGS: A total of approximately 3.2 liters of serous fluid was removed. IMPRESSION: Successful ultrasound-guided paracentesis yielding 3.2 liters of peritoneal fluid. Electronically Signed   By: Sandi Mariscal M.D.   On: 11/03/2020 11:02   Assessment/Plan  1.  End-stage renal disease requiring hemodialysis: The patient currently has a functioning upper extremity dialysis access and therefore there is no longer need of her PermCath. We will plan on PermCath removal. Procedure, risks and benefits were explained to the patient. All questions were answered. The patient wished to proceed. Patient will continue dialysis therapy without further interruption if a successful intervention is not achieved then a tunneled catheter will be placed. Dialysis has already been arranged. 2.  Hypertension:  Patient will continue medical management; nephrology is following no changes in oral medications. 3. Diabetes mellitus:  Glucose will be monitored and oral medications been held this morning once the patient has undergone the patient's procedure po intake will be reinitiated and again Accu-Cheks will be used to assess the blood glucose level  and treat as needed. The patient will be restarted on the patient's usual hypoglycemic regime 4.  Coronary artery disease:  EKG will be monitored. Nitrates will be used if needed. The patient's oral cardiac medications will be continued.  Discussed with Dr. Mayme Genta, PA-C  11/03/2020 4:45 PM

## 2020-11-03 NOTE — Plan of Care (Signed)
  Problem: Health Behavior/Discharge Planning: Goal: Ability to manage health-related needs will improve Outcome: Progressing   Problem: Clinical Measurements: Goal: Diagnostic test results will improve Outcome: Progressing Goal: Respiratory complications will improve Outcome: Progressing Goal: Cardiovascular complication will be avoided Outcome: Progressing   

## 2020-11-04 ENCOUNTER — Inpatient Hospital Stay: Admit: 2020-11-04 | Payer: Medicare Other

## 2020-11-04 ENCOUNTER — Encounter: Payer: Self-pay | Admitting: Vascular Surgery

## 2020-11-04 DIAGNOSIS — K529 Noninfective gastroenteritis and colitis, unspecified: Secondary | ICD-10-CM | POA: Diagnosis not present

## 2020-11-04 DIAGNOSIS — R7881 Bacteremia: Secondary | ICD-10-CM | POA: Diagnosis not present

## 2020-11-04 DIAGNOSIS — I10 Essential (primary) hypertension: Secondary | ICD-10-CM | POA: Diagnosis not present

## 2020-11-04 DIAGNOSIS — B952 Enterococcus as the cause of diseases classified elsewhere: Secondary | ICD-10-CM | POA: Diagnosis not present

## 2020-11-04 LAB — CBC
HCT: 25.8 % — ABNORMAL LOW (ref 36.0–46.0)
Hemoglobin: 8 g/dL — ABNORMAL LOW (ref 12.0–15.0)
MCH: 25.8 pg — ABNORMAL LOW (ref 26.0–34.0)
MCHC: 31 g/dL (ref 30.0–36.0)
MCV: 83.2 fL (ref 80.0–100.0)
Platelets: 124 10*3/uL — ABNORMAL LOW (ref 150–400)
RBC: 3.1 MIL/uL — ABNORMAL LOW (ref 3.87–5.11)
RDW: 15 % (ref 11.5–15.5)
WBC: 9.6 10*3/uL (ref 4.0–10.5)
nRBC: 0 % (ref 0.0–0.2)

## 2020-11-04 LAB — GAMMA GT: GGT: 205 U/L — ABNORMAL HIGH (ref 7–50)

## 2020-11-04 LAB — COMPREHENSIVE METABOLIC PANEL
ALT: 97 U/L — ABNORMAL HIGH (ref 0–44)
AST: 92 U/L — ABNORMAL HIGH (ref 15–41)
Albumin: 2.7 g/dL — ABNORMAL LOW (ref 3.5–5.0)
Alkaline Phosphatase: 144 U/L — ABNORMAL HIGH (ref 38–126)
Anion gap: 14 (ref 5–15)
BUN: 55 mg/dL — ABNORMAL HIGH (ref 6–20)
CO2: 23 mmol/L (ref 22–32)
Calcium: 8.5 mg/dL — ABNORMAL LOW (ref 8.9–10.3)
Chloride: 91 mmol/L — ABNORMAL LOW (ref 98–111)
Creatinine, Ser: 7.42 mg/dL — ABNORMAL HIGH (ref 0.44–1.00)
GFR, Estimated: 6 mL/min — ABNORMAL LOW (ref >60)
Glucose, Bld: 103 mg/dL — ABNORMAL HIGH (ref 70–99)
Potassium: 4.5 mmol/L (ref 3.5–5.1)
Sodium: 128 mmol/L — ABNORMAL LOW (ref 135–145)
Total Bilirubin: 0.8 mg/dL (ref 0.3–1.2)
Total Protein: 6.8 g/dL (ref 6.5–8.1)

## 2020-11-04 LAB — URINE CULTURE: Culture: NO GROWTH

## 2020-11-04 MED ORDER — ACETAMINOPHEN 325 MG PO TABS
650.0000 mg | ORAL_TABLET | Freq: Four times a day (QID) | ORAL | Status: DC | PRN
  Administered 2020-11-04: 650 mg via ORAL
  Filled 2020-11-04: qty 2

## 2020-11-04 MED ORDER — EPOETIN ALFA 10000 UNIT/ML IJ SOLN
10000.0000 [IU] | INTRAMUSCULAR | Status: DC
  Administered 2020-11-04: 10000 [IU] via INTRAVENOUS

## 2020-11-04 MED ORDER — CALCIUM ACETATE (PHOS BINDER) 667 MG PO CAPS
1334.0000 mg | ORAL_CAPSULE | Freq: Three times a day (TID) | ORAL | Status: DC
  Administered 2020-11-04 – 2020-11-05 (×4): 1334 mg via ORAL
  Filled 2020-11-04 (×5): qty 2

## 2020-11-04 NOTE — Progress Notes (Signed)
[facility]  ROUNDING NOTE   Subjective:   Patient seen in dialysis, tolerating treatment well.    HEMODIALYSIS FLOWSHEET:  Blood Flow Rate (mL/min): 400 mL/min Arterial Pressure (mmHg): -180 mmHg Venous Pressure (mmHg): 170 mmHg Transmembrane Pressure (mmHg): 70 mmHg Ultrafiltration Rate (mL/min): 830 mL/min Dialysate Flow Rate (mL/min): 600 ml/min Conductivity: Machine : 13.8 Conductivity: Machine : 13.8 Dialysis Fluid Bolus: Normal Saline Bolus Amount (mL): 250 mL    Objective:  Vital signs in last 24 hours:  Temp:  [97.8 F (36.6 C)-98.7 F (37.1 C)] 98.7 F (37.1 C) (11/05 1047) Pulse Rate:  [85-96] 96 (11/05 1047) Resp:  [17-19] 17 (11/05 1047) BP: (104-123)/(63-79) 123/67 (11/05 1047) SpO2:  [93 %-98 %] 95 % (11/05 0722) Weight:  [77.1 kg] 77.1 kg (11/04 1544)  Weight change: -5.089 kg Filed Weights   11/02/20 1224 11/03/20 1544  Weight: 82.2 kg 77.1 kg    Intake/Output: I/O last 3 completed shifts: In: 680 [P.O.:480; IV Piggyback:200] Out: 3000 [Other:3000]   Intake/Output this shift:  No intake/output data recorded.  Physical Exam: General: In no acute distress  Head: Moist oral mucosal membranes  Eyes: Sclerae and conjunctivae clear  Lungs:  Respirations even,unlabored, lungs clear  Heart: S1S2, no rubs or gallops  Abdomen:  Soft, nontender , non distended  Extremities:  Trace peripheral edema.  Neurologic: Awake, alert, oriented  Skin: No acute lesions or rashes  Access: LUA AVF +thrill,+bruit    Basic Metabolic Panel: Recent Labs  Lab 11/02/20 1230 11/03/20 0349 11/04/20 0422  NA 131* 134* 128*  K 5.3* 4.2 4.5  CL 90* 94* 91*  CO2 21* 26 23  GLUCOSE 98 156* 103*  BUN 54* 34* 55*  CREATININE 7.31* 5.21* 7.42*  CALCIUM 9.2 8.7* 8.5*  MG 1.6*  --   --     Liver Function Tests: Recent Labs  Lab 11/02/20 1236 11/03/20 0349 11/04/20 0422  AST 74* 133* 92*  ALT 50* 86* 97*  ALKPHOS 146* 129* 144*  BILITOT 1.3*  1.0 0.8  PROT 8.1 7.0 6.8  ALBUMIN 3.4* 3.0* 2.7*   No results for input(s): LIPASE, AMYLASE in the last 168 hours. No results for input(s): AMMONIA in the last 168 hours.  CBC: Recent Labs  Lab 11/02/20 1230 11/03/20 0349 11/04/20 0422  WBC 11.6* 13.4* 9.6  HGB 9.6* 8.6* 8.0*  HCT 30.4* 26.6* 25.8*  MCV 83.7 81.6 83.2  PLT 175 140* 124*    Cardiac Enzymes: No results for input(s): CKTOTAL, CKMB, CKMBINDEX, TROPONINI in the last 168 hours.  BNP: Invalid input(s): POCBNP  CBG: Recent Labs  Lab 11/02/20 1555 11/02/20 1631  GLUCAP 60* 80    Microbiology: Results for orders placed or performed during the hospital encounter of 11/02/20  Respiratory Panel by RT PCR (Flu A&B, Covid) - Nasopharyngeal Swab     Status: None   Collection Time: 11/02/20  3:32 PM   Specimen: Nasopharyngeal Swab  Result Value Ref Range Status   SARS Coronavirus 2 by RT PCR NEGATIVE NEGATIVE Final    Comment: (NOTE) SARS-CoV-2 target nucleic acids are NOT DETECTED.  The SARS-CoV-2 RNA is generally detectable in upper respiratoy specimens during the acute phase of infection. The lowest concentration of SARS-CoV-2 viral copies this assay can detect is 131 copies/mL. A negative result does not preclude SARS-Cov-2 infection and should not be used as the sole basis for treatment or other patient management decisions. A negative result may occur with  improper specimen collection/handling, submission of specimen  other than nasopharyngeal swab, presence of viral mutation(s) within the areas targeted by this assay, and inadequate number of viral copies (<131 copies/mL). A negative result must be combined with clinical observations, patient history, and epidemiological information. The expected result is Negative.  Fact Sheet for Patients:  PinkCheek.be  Fact Sheet for Healthcare Providers:  GravelBags.it  This test is no t yet approved  or cleared by the [place] FDA and  has been authorized for detection and/or diagnosis of SARS-CoV-2 by FDA under an Emergency Use Authorization (EUA). This EUA will remain  in effect (meaning this test can be used) for the duration of the COVID-19 declaration under Section 564(b)(1) of the Act, 21 U.S.C. section 360bbb-3(b)(1), unless the authorization is terminated or revoked sooner.     Influenza A by PCR NEGATIVE NEGATIVE Final   Influenza B by PCR NEGATIVE NEGATIVE Final    Comment: (NOTE) The Xpert Xpress SARS-CoV-2/FLU/RSV assay is intended as an aid in  the diagnosis of influenza from Nasopharyngeal swab specimens and  should not be used as a sole basis for treatment. Nasal washings and  aspirates are unacceptable for Xpert Xpress SARS-CoV-2/FLU/RSV  testing.  Fact Sheet for Patients: PinkCheek.be  Fact Sheet for Healthcare Providers: GravelBags.it  This test is not yet approved or cleared by the [place] FDA and  has been authorized for detection and/or diagnosis of SARS-CoV-2 by  FDA under an Emergency Use Authorization (EUA). This EUA will remain  in effect (meaning this test can be used) for the duration of the  Covid-19 declaration under Section 564(b)(1) of the Act, 21  U.S.C. section 360bbb-3(b)(1), unless the authorization is  terminated or revoked. Performed at [facility], [address], [place], [place] [address]   Blood Culture (routine x 2)     Status: Abnormal (Preliminary result)   Collection Time: 11/02/20  4:44 PM   Specimen: BLOOD  Result Value Ref Range Status   Specimen Description   Final    BLOOD RIGHT ANTECUBITAL Performed at [facility], [address], [place], [place] [address]    Special Requests   Final    BOTTLES DRAWN AEROBIC AND ANAEROBIC Blood Culture results may not be optimal due to an inadequate volume of blood received in culture  bottles Performed at [facility], [address], [place], [place] [address]    Culture  Setup Time   Final    GRAM POSITIVE COCCI IN BOTH AEROBIC AND ANAEROBIC BOTTLES CRITICAL RESULT CALLED TO, READ BACK BY AND VERIFIED WITH: JASON ROBBINS AT 1937 11/03/20 SDR    Culture (A)  Final    ENTEROCOCCUS FAECALIS SUSCEPTIBILITIES TO FOLLOW Performed at [facility], [place] [address], [place], [place] [address]    Report Status PENDING  Incomplete  Blood Culture ID Panel (Reflexed)     Status: Abnormal   Collection Time: 11/02/20  4:44 PM  Result Value Ref Range Status   Enterococcus faecalis DETECTED (A) NOT DETECTED Final    Comment: CRITICAL RESULT CALLED TO, READ BACK BY AND VERIFIED WITH: JASON ROBBONS AT 9735 11/03/20 SDR    Enterococcus Faecium NOT DETECTED NOT DETECTED Final   Listeria monocytogenes NOT DETECTED NOT DETECTED Final   Staphylococcus species NOT DETECTED NOT DETECTED Final   Staphylococcus aureus (BCID) NOT DETECTED NOT DETECTED Final   Staphylococcus epidermidis NOT DETECTED NOT DETECTED Final   Staphylococcus lugdunensis NOT DETECTED NOT DETECTED Final   Streptococcus species NOT DETECTED NOT DETECTED Final  Streptococcus agalactiae NOT DETECTED NOT DETECTED Final   Streptococcus pneumoniae NOT DETECTED NOT DETECTED Final   Streptococcus pyogenes NOT DETECTED NOT DETECTED Final   A.calcoaceticus-baumannii NOT DETECTED NOT DETECTED Final   Bacteroides fragilis NOT DETECTED NOT DETECTED Final   Enterobacterales NOT DETECTED NOT DETECTED Final   Enterobacter cloacae complex NOT DETECTED NOT DETECTED Final   Escherichia coli NOT DETECTED NOT DETECTED Final   Klebsiella aerogenes NOT DETECTED NOT DETECTED Final   Klebsiella oxytoca NOT DETECTED NOT DETECTED Final   Klebsiella pneumoniae NOT DETECTED NOT DETECTED Final   Proteus species NOT DETECTED NOT DETECTED Final   Salmonella species NOT DETECTED NOT DETECTED Final   Serratia marcescens NOT  DETECTED NOT DETECTED Final   Haemophilus influenzae NOT DETECTED NOT DETECTED Final   Neisseria meningitidis NOT DETECTED NOT DETECTED Final   Pseudomonas aeruginosa NOT DETECTED NOT DETECTED Final   Stenotrophomonas maltophilia NOT DETECTED NOT DETECTED Final   Candida albicans NOT DETECTED NOT DETECTED Final   Candida auris NOT DETECTED NOT DETECTED Final   Candida glabrata NOT DETECTED NOT DETECTED Final   Candida krusei NOT DETECTED NOT DETECTED Final   Candida parapsilosis NOT DETECTED NOT DETECTED Final   Candida tropicalis NOT DETECTED NOT DETECTED Final   Cryptococcus neoformans/gattii NOT DETECTED NOT DETECTED Final   Vancomycin resistance NOT DETECTED NOT DETECTED Final    Comment: Performed at [facility], [address] [place], [place] [address]  Blood Culture (routine x 2)     Status: Abnormal (Preliminary result)   Collection Time: 11/02/20  4:49 PM   Specimen: BLOOD  Result Value Ref Range Status   Specimen Description   Final    BLOOD BLOOD RIGHT WRIST Performed at [facility], [address] [place], [place] [address]    Special Requests   Final    BOTTLES DRAWN AEROBIC AND ANAEROBIC Blood Culture results may not be optimal due to an excessive volume of blood received in culture bottles Performed at [facility], [address] [place], [place] [address]    Culture  Setup Time   Final    GRAM POSITIVE COCCI IN BOTH AEROBIC AND ANAEROBIC BOTTLES CRITICAL VALUE NOTED.  VALUE IS CONSISTENT WITH PREVIOUSLY REPORTED AND CALLED VALUE. Performed at [facility], [address], [place], [place] [address]    Culture ENTEROCOCCUS FAECALIS (A)  Final   Report Status PENDING  Incomplete  Body fluid culture     Status: None (Preliminary result)   Collection Time: 11/03/20  9:35 AM   Specimen: Peritoneal Dialysate  Result Value Ref Range Status   Specimen Description   Final    PERITONEAL DIALYSATE Performed at [facility], [address] [place], [place] [address]    Special Requests   Final    NONE Performed at [facility], [address] [place], [place] [address]    Gram Stain   Final    RARE WBC PRESENT,BOTH PMN AND MONONUCLEAR NO ORGANISMS SEEN    Culture   Final    NO GROWTH < 24 HOURS Performed at [facility], [place] [address] [place],  [address]    Report Status PENDING  Incomplete  Gastrointestinal Panel by PCR , Stool     Status: Abnormal   Collection Time: 11/03/20  9:38 PM   Specimen: STOOL  Result Value Ref Range Status   Campylobacter species NOT DETECTED NOT DETECTED Final   Plesimonas shigelloides DETECTED (A) NOT DETECTED Final  Comment: RESULT CALLED TO, READ BACK BY AND VERIFIED WITH: SKYLAR HARDIN RN 2323 11/03/20 HNM    Salmonella species NOT DETECTED NOT DETECTED Final   Yersinia enterocolitica NOT DETECTED NOT DETECTED Final   Vibrio species NOT DETECTED NOT DETECTED Final   Vibrio cholerae NOT DETECTED NOT DETECTED Final   Enteroaggregative E coli (EAEC) NOT DETECTED NOT DETECTED Final   Enteropathogenic E coli (EPEC) NOT DETECTED NOT DETECTED Final   Enterotoxigenic E coli (ETEC) NOT DETECTED NOT DETECTED Final   Shiga like toxin producing E coli (STEC) NOT DETECTED NOT DETECTED Final   Shigella/Enteroinvasive E coli (EIEC) NOT DETECTED NOT DETECTED Final   Cryptosporidium NOT DETECTED NOT DETECTED Final   Cyclospora cayetanensis NOT DETECTED NOT DETECTED Final   Entamoeba histolytica NOT DETECTED NOT DETECTED Final   Giardia lamblia NOT DETECTED NOT DETECTED Final   Adenovirus F40/41 NOT DETECTED NOT DETECTED Final   Astrovirus NOT DETECTED NOT DETECTED Final   Norovirus GI/GII NOT DETECTED NOT DETECTED Final   Rotavirus A NOT DETECTED NOT DETECTED Final   Sapovirus (I, II, IV, and V) NOT DETECTED NOT DETECTED Final    Comment: Performed at [facility], [address] [place], [place] [address]  C Difficile Quick  Screen w PCR reflex     Status: None   Collection Time: 11/03/20  9:39 PM   Specimen: STOOL  Result Value Ref Range Status   C Diff antigen NEGATIVE NEGATIVE Final   C Diff toxin NEGATIVE NEGATIVE Final   C Diff interpretation No C. difficile detected.  Final    Comment: Performed at [facility], [address] [place], [place] [address]    Coagulation Studies: Recent Labs    11/02/20 1644 11/03/20 0349  LABPROT 17.2* 16.4*  INR 1.5* 1.4*    Urinalysis: Recent Labs    11/03/20 2139  COLORURINE AMBER*  LABSPEC 1.015  PHURINE 6.0  GLUCOSEU NEGATIVE  HGBUR SMALL*  BILIRUBINUR NEGATIVE  KETONESUR NEGATIVE  PROTEINUR >=300*  NITRITE NEGATIVE  LEUKOCYTESUR SMALL*      Imaging: DG Chest 1 View  Result Date: 11/02/2020 CLINICAL DATA:  Chills during dialysis EXAM: CHEST  1 VIEW COMPARISON:  03/03/2020 FINDINGS: Single frontal view of the chest demonstrates right internal jugular dialysis catheter tip overlying superior vena cava. Cardiac silhouette remains enlarged. There is central vascular congestion with mild diffuse interstitial prominence. No airspace disease, effusion, or pneumothorax. No acute bony abnormality. IMPRESSION: 1. Vascular congestion and mild interstitial edema. 2. Stable enlarged cardiac silhouette. Electronically Signed   By: Randa Ngo M.D.   On: 11/02/2020 15:49   US Abdomen Complete  Result Date: 11/03/2020 CLINICAL DATA:  RIGHT upper quadrant abdominal pain; history end-stage renal disease on dialysis, smoker, COPD, CHF at EXAM: ABDOMEN ULTRASOUND COMPLETE COMPARISON:  11/12/2019 FINDINGS: Gallbladder: Mildly thickened gallbladder wall, a nonspecific finding in the setting of ascites. Small amount of dependent sludge within gallbladder. No shadowing calculi or sonographic Murphy sign. Common bile duct: Diameter: 3 mm, normal Liver: Normal echogenicity without mass. Hepatic margins appear minimally nodular raising question of cirrhosis. Portal  vein is patent on color Doppler imaging with normal direction of blood flow towards the liver. IVC: Normal appearance Pancreas: Normal appearance. Pancreatic duct upper normal caliber 2.7 mm. Spleen: Normal appearance, 11.6 cm length. Few collaterals at splenic hilum. Right Kidney: Length: 9.5 cm. Cortical thinning and atrophy. Increased cortical echogenicity. Small cyst at upper pole 14 x 12 x 14 mm. No additional masses or hydronephrosis. Left  Kidney: Length: 9.0 cm. Cortical thinning. Increased cortical echogenicity. Tiny cyst at mid upper kidney 6 x 6 x 6 mm. No additional mass or hydronephrosis Abdominal aorta: Normal caliber Other findings: Minimal scattered ascites. IMPRESSION: Cortical thinning and medical renal disease changes of both kidneys with tiny renal cysts. Minimal sludge within gallbladder. Minimally nodular hepatic margins raising question of cirrhosis. Minimal scattered ascites Electronically Signed   By: Lavonia Dana M.D.   On: 11/03/2020 10:58   PERIPHERAL VASCULAR CATHETERIZATION  Result Date: 11/03/2020 See op note  US Paracentesis  Result Date: 11/03/2020 INDICATION: History of end-stage renal disease, on dialysis with recurrent symptomatic ascites. Please perform ultrasound-guided paracentesis for therapeutic purposes. EXAM: ULTRASOUND-GUIDED PARACENTESIS COMPARISON:  Multiple previous ultrasound-guided paracenteses, most recently 09/20/2020 yielding 4.9 L of peritoneal fluid. MEDICATIONS: None. COMPLICATIONS: None immediate. TECHNIQUE: Informed written consent was obtained from the patient after a discussion of the risks, benefits and alternatives to treatment. A timeout was performed prior to the initiation of the procedure. Initial ultrasound scanning demonstrates a small amount of ascites within the left lower abdomen which was subsequently prepped and draped in the usual sterile fashion. 1% lidocaine with epinephrine was used for local anesthesia. Under direct ultrasound  guidance, a 19 gauge, 7-cm, Yueh catheter was introduced. An ultrasound image was saved for documentation purposed. The paracentesis was performed. The catheter was removed and a dressing was applied. The patient tolerated the procedure well without immediate post procedural complication. FINDINGS: A total of approximately 3.2 liters of serous fluid was removed. IMPRESSION: Successful ultrasound-guided paracentesis yielding 3.2 liters of peritoneal fluid. Electronically Signed   By: Sandi Mariscal M.D.   On: 11/03/2020 11:02     Medications:   . sodium chloride    . albumin human    . ampicillin (OMNIPEN) IV Stopped (11/04/20 0839)   . amLODipine  10 mg Oral Daily  . Chlorhexidine Gluconate Cloth  6 each Topical Daily  . DULoxetine  60 mg Oral BID  . epoetin (EPOGEN/PROCRIT) injection  10,000 Units Intravenous Q M,W,F-HD  . heparin  5,000 Units Subcutaneous Q8H  . metoprolol tartrate  50 mg Oral BID  . mometasone-formoterol  2 puff Inhalation BID  . multivitamin  1 tablet Oral Daily  . nicotine  21 mg Transdermal Daily  . pantoprazole  40 mg Oral Daily  . sodium chloride flush  3 mL Intravenous Q12H  . torsemide  100 mg Oral Daily  . [START ON 11/06/2020] Vitamin D (Ergocalciferol)  50,000 Units Oral Q Sun   sodium chloride, acetaminophen, albumin human, albuterol, albuterol, benzonatate, docusate sodium, hydrOXYzine, oxyCODONE, QUEtiapine, sodium chloride flush  Assessment/ Plan:  Janet Olson is a 50 y.o.  female known to our practice, has past medical history of diastolic heart failure,anemia, COPD,hypertension and ESRD on hemodialysis MWF. She presented to the ED with c/o chills and SOB x 1 day.She went to her dialysis center today,3 minutes into the treatment, she reported worsening chills and was sent to ED.  #ESRD on dialysis MWF Patient is receiving dialysis treatment today, tolerating well Will continue MWF schedule  #Hypertension Normotensive  Continue current  antihypertensive regimen  # Secondary Hyperparathyroidism Calcium 8.5 Phosphorus 6.4 on 03/03/2020 Will restart Phoslo  #Anemia of CKD Hemoglobin 8.0 Will continue Epogen with dialysis MWF   LOS: 1 Nikea Settle 11/5/20211:24 PM

## 2020-11-04 NOTE — Progress Notes (Signed)
Date of Admission:  11/02/2020       Subjective: Intermittent diarrhea for a few months No fever Medications:  . amLODipine  10 mg Oral Daily  . Chlorhexidine Gluconate Cloth  6 each Topical Daily  . DULoxetine  60 mg Oral BID  . epoetin (EPOGEN/PROCRIT) injection  10,000 Units Intravenous Q M,W,F-HD  . heparin  5,000 Units Subcutaneous Q8H  . metoprolol tartrate  50 mg Oral BID  . mometasone-formoterol  2 puff Inhalation BID  . multivitamin  1 tablet Oral Daily  . nicotine  21 mg Transdermal Daily  . pantoprazole  40 mg Oral Daily  . sodium chloride flush  3 mL Intravenous Q12H  . torsemide  100 mg Oral Daily  . [START ON 11/06/2020] Vitamin D (Ergocalciferol)  50,000 Units Oral Q Sun    Objective: Vital signs in last 24 hours: Temp:  [97.8 F (36.6 C)-98.6 F (37 C)] 97.8 F (36.6 C) (11/05 0722) Pulse Rate:  [75-92] 86 (11/05 0722) Resp:  [16-19] 18 (11/05 0722) BP: (104-118)/(63-79) 104/63 (11/05 0722) SpO2:  [93 %-100 %] 95 % (11/05 0722) Weight:  [77.1 kg] 77.1 kg (11/04 1544)  PHYSICAL EXAM:  General: Alert, cooperative, no distress, appears stated age.  Rt IJ dialysis catheter has been removed Lungs: b/l air entry Heart: Regular rate and rhythm, no murmur, rub or gallop. Abdomen: Soft, t distended. Minimal tenderness rt upper quadant Bowel sounds normal. No masses Extremities: atraumatic, no cyanosis. No edema. No clubbing Skin: No rashes or lesions. Or bruising Lymph: Cervical, supraclavicular normal. Neurologic: Grossly non-focal  Lab Results Recent Labs    11/03/20 0349 11/04/20 0422  WBC 13.4* 9.6  HGB 8.6* 8.0*  HCT 26.6* 25.8*  NA 134* 128*  K 4.2 4.5  CL 94* 91*  CO2 26 23  BUN 34* 55*  CREATININE 5.21* 7.42*   Liver Panel Recent Labs    11/02/20 1236 11/02/20 1236 11/03/20 0349 11/04/20 0422  PROT 8.1   < > 7.0 6.8  ALBUMIN 3.4*   < > 3.0* 2.7*  AST 74*   < > 133* 92*  ALT 50*   < > 86* 97*  ALKPHOS 146*   < > 129* 144*   BILITOT 1.3*   < > 1.0 0.8  BILIDIR 0.3*  --  0.2  --   IBILI 1.0*  --  0.8  --    < > = values in this interval not displayed.   Sedimentation Rate No results for input(s): ESRSEDRATE in the last 72 hours. C-Reactive Protein No results for input(s): CRP in the last 72 hours.  Microbiology:  Studies/Results: DG Chest 1 View  Result Date: 11/02/2020 CLINICAL DATA:  Chills during dialysis EXAM: CHEST  1 VIEW COMPARISON:  03/03/2020 FINDINGS: Single frontal view of the chest demonstrates right internal jugular dialysis catheter tip overlying superior vena cava. Cardiac silhouette remains enlarged. There is central vascular congestion with mild diffuse interstitial prominence. No airspace disease, effusion, or pneumothorax. No acute bony abnormality. IMPRESSION: 1. Vascular congestion and mild interstitial edema. 2. Stable enlarged cardiac silhouette. Electronically Signed   By: Randa Ngo M.D.   On: 11/02/2020 15:49   US Abdomen Complete  Result Date: 11/03/2020 CLINICAL DATA:  RIGHT upper quadrant abdominal pain; history end-stage renal disease on dialysis, smoker, COPD, CHF at EXAM: ABDOMEN ULTRASOUND COMPLETE COMPARISON:  11/12/2019 FINDINGS: Gallbladder: Mildly thickened gallbladder wall, a nonspecific finding in the setting of ascites. Small amount of dependent sludge within gallbladder. No shadowing calculi  or sonographic Murphy sign. Common bile duct: Diameter: 3 mm, normal Liver: Normal echogenicity without mass. Hepatic margins appear minimally nodular raising question of cirrhosis. Portal vein is patent on color Doppler imaging with normal direction of blood flow towards the liver. IVC: Normal appearance Pancreas: Normal appearance. Pancreatic duct upper normal caliber 2.7 mm. Spleen: Normal appearance, 11.6 cm length. Few collaterals at splenic hilum. Right Kidney: Length: 9.5 cm. Cortical thinning and atrophy. Increased cortical echogenicity. Small cyst at upper pole 14 x 12 x 14  mm. No additional masses or hydronephrosis. Left Kidney: Length: 9.0 cm. Cortical thinning. Increased cortical echogenicity. Tiny cyst at mid upper kidney 6 x 6 x 6 mm. No additional mass or hydronephrosis Abdominal aorta: Normal caliber Other findings: Minimal scattered ascites. IMPRESSION: Cortical thinning and medical renal disease changes of both kidneys with tiny renal cysts. Minimal sludge within gallbladder. Minimally nodular hepatic margins raising question of cirrhosis. Minimal scattered ascites Electronically Signed   By: Lavonia Dana M.D.   On: 11/03/2020 10:58   PERIPHERAL VASCULAR CATHETERIZATION  Result Date: 11/03/2020 See op note  US Paracentesis  Result Date: 11/03/2020 INDICATION: History of end-stage renal disease, on dialysis with recurrent symptomatic ascites. Please perform ultrasound-guided paracentesis for therapeutic purposes. EXAM: ULTRASOUND-GUIDED PARACENTESIS COMPARISON:  Multiple previous ultrasound-guided paracenteses, most recently 09/20/2020 yielding 4.9 L of peritoneal fluid. MEDICATIONS: None. COMPLICATIONS: None immediate. TECHNIQUE: Informed written consent was obtained from the patient after a discussion of the risks, benefits and alternatives to treatment. A timeout was performed prior to the initiation of the procedure. Initial ultrasound scanning demonstrates a small amount of ascites within the left lower abdomen which was subsequently prepped and draped in the usual sterile fashion. 1% lidocaine with epinephrine was used for local anesthesia. Under direct ultrasound guidance, a 19 gauge, 7-cm, Yueh catheter was introduced. An ultrasound image was saved for documentation purposed. The paracentesis was performed. The catheter was removed and a dressing was applied. The patient tolerated the procedure well without immediate post procedural complication. FINDINGS: A total of approximately 3.2 liters of serous fluid was removed. IMPRESSION: Successful ultrasound-guided  paracentesis yielding 3.2 liters of peritoneal fluid. Electronically Signed   By: Sandi Mariscal M.D.   On: 11/03/2020 11:02     Assessment/Plan: Enterococcus bacteremia In a patient on dialysis through catheter.  Very likely the catheter is the source.  Catheter has been removed.  A TEE may be  required after initial 2D echo.  Repeat blood cultures for clearance of bacteremia.  continue ampicillin  Hypertension.  Was out of meds for 2 days.  Currently on amlodipine and metoprolol and torsemide.   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 Cell count164 ( 39% N) - no peritonitis   AST > ALT hypoalbuminemia excess alcohol likely  cirrhosis- US shows minimal nodularity liver?  ID will follow peripherally this weekend- call if needed

## 2020-11-04 NOTE — Progress Notes (Signed)
Patient complaining of pain in left Upper arm AV fistula. Pain medication given.Patient is  requesting doctor to assess the site when available. NP morrison notified.

## 2020-11-04 NOTE — Progress Notes (Signed)
PROGRESS NOTE    Janet Olson  ACZ:[id number] DOB: Aug 06, 1970 DOA: 11/02/2020 PCP: Remi Haggard, FNP     Brief Narrative:  Janet Olson is a 50 y.o. female with medical history significant for hypertension, chronic daily tobacco use, end-stage renal disease on hemodialysis, anxiety/depression, chronic daily diarrhea, presents to the emergency department via EMS for nonspecific concerns including numbness in extremities and pain, chills. She reports the symptoms started during dialysis.  Patient underwent paracentesis with 3.2 L fluid removal.  Blood culture returned positive for Enterococcus bacteremia.  She was started on IV ampicillin.  Infectious disease consulted.  Tunneled hemodialysis catheter removed on 11/4.  New events last 24 hours / Subjective: Complaining of some pain in her left upper extremity.  Having some sweats but no fevers or chills.  No abdominal pain.  States that her diarrhea has improved.  Assessment & Plan:   Principal Problem:   Sepsis ([place]) Active Problems:   Anemia in chronic kidney disease   COPD (chronic obstructive pulmonary disease) ([facility])   ESRD on dialysis ([facility])   Hyperkalemia   Abdominal ascites   Tobacco use   Right upper quadrant abdominal pain   Mass of ear canal, left   Enterococcus faecalis infection    Sepsis secondary to Enterococcus bacteremia -Sepsis not present on admission - VS with HR 83, RR 18, WBC 11.6, T 98.8F at time of admission. Now with RR in 20s with elevated WBC -Right chest tunneled hemodialysis catheter removed 11/4 -Continue ampicillin -Infectious disease following -Echocardiogram pending.  Contacted cardiology for TEE, unable to get on schedule today or over the weekend.  May have to follow-up with TEE on Monday versus as an outpatient.  Acute on chronic diarrhea -C. 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 Fluid studies cell count negative for peritonitis -Abdominal pain resolved post paracentesis  ESRD on HD MWF -Nephrology following -Continue torsemide  Essential hypertension -Continue amlodipine, lopressor, torsemide  Anxiety/depression -Continue Cymbalta, hydroxyzine, quetiapine  GERD -Continue Protonix   Left ear canal growth  -Follow-up ENT outpatient   DVT prophylaxis:  heparin injection 5,000 Units Start: 11/02/20 2200 Place TED hose Start: 11/02/20 1734  Code Status: Full Family Communication: No family at bedside Disposition Plan:  Status is: Inpatient  Remains inpatient appropriate because:Ongoing diagnostic testing needed not appropriate for outpatient work up and IV treatments appropriate due to intensity of illness or inability to take PO   Dispo: The patient is from: Home              Anticipated d/c is to: Home              Anticipated d/c date is: > 3 days              Patient currently is not medically stable to d/c.  Remains on IV antibiotics.  Echocardiogram and TEE pending     Consultants:   Nephrology  Vascular surgery  ID  Procedures:   Paracentesis 11/4 with 3.2L fluid removal   Antimicrobials:  Anti-infectives (From admission, onward)   Start     Dose/Rate Route Frequency Ordered Stop   11/03/20 0830  ampicillin (OMNIPEN) 2 g in sodium chloride 0.9 % 100 mL IVPB        2 g 300 mL/hr over 20 Minutes  Intravenous Every 12 hours 11/03/20 0807     11/03/20 0800  ampicillin (OMNIPEN) 2 g in sodium chloride 0.9 % 100 mL IVPB  Status:  Discontinued        2 g 300 mL/hr over 20 Minutes Intravenous Every 4 hours 11/03/20 0547 11/03/20 0807   11/02/20 1645  vancomycin (VANCOCIN) IVPB 1000 mg/200 mL premix  Status:  Discontinued        1,000 mg 200 mL/hr over 60 Minutes  Intravenous  Once 11/02/20 1644 11/03/20 0545   11/02/20 1645  ceFEPIme (MAXIPIME) 2 g in sodium chloride 0.9 % 100 mL IVPB        2 g 200 mL/hr over 30 Minutes Intravenous  Once 11/02/20 1644 11/02/20 1821   11/02/20 1645  azithromycin (ZITHROMAX) 500 mg in sodium chloride 0.9 % 250 mL IVPB  Status:  Discontinued        500 mg 250 mL/hr over 60 Minutes Intravenous  Once 11/02/20 1644 11/03/20 0546       Objective: Vitals:   11/03/20 1645 11/04/20 0009 11/04/20 0412 11/04/20 0722  BP: 115/73 118/64 110/79 104/63  Pulse: 85 92 88 86  Resp: 19  18 18   Temp:  98.4 F (36.9 C) 98.6 F (37 C) 97.8 F (36.6 C)  TempSrc:  Oral Oral Oral  SpO2: 98% 95% 95% 95%  Weight:      Height:        Intake/Output Summary (Last 24 hours) at 11/04/2020 0944 Last data filed at 11/04/2020 0542 Gross per 24 hour  Intake 680 ml  Output --  Net 680 ml   Filed Weights   11/02/20 1224 11/03/20 1544  Weight: 82.2 kg 77.1 kg    Examination: General exam: Appears calm and comfortable  Respiratory system: Clear to auscultation. Respiratory effort normal. Cardiovascular system: S1 & S2 heard, RRR. No pedal edema. Gastrointestinal system: Abdomen is nondistended, soft and nontender. Normal bowel sounds heard. Central nervous system: Alert and oriented. Non focal exam. Speech clear  Extremities: Symmetric in appearance bilaterally, left upper extremity fistula with palpable thrill Skin: No rashes, lesions or ulcers on exposed skin  Psychiatry: Judgement and insight appear stable. Mood & affect appropriate.    Data Reviewed: I have personally reviewed following labs and imaging studies  CBC: Recent Labs  Lab 11/02/20 1230 11/03/20 0349 11/04/20 0422  WBC 11.6* 13.4* 9.6  HGB 9.6* 8.6* 8.0*  HCT 30.4* 26.6* 25.8*  MCV 83.7 81.6 83.2  PLT 175 140* 161*   Basic Metabolic Panel: Recent Labs  Lab 11/02/20 1230 11/03/20 0349 11/04/20 0422  NA 131* 134* 128*  K 5.3* 4.2 4.5  CL 90* 94* 91*   CO2 21* 26 23  GLUCOSE 98 156* 103*  BUN 54* 34* 55*  CREATININE 7.31* 5.21* 7.42*  CALCIUM 9.2 8.7* 8.5*  MG 1.6*  --   --    GFR: Estimated Creatinine Clearance: 9.3 mL/min (A) (by C-G formula based on SCr of 7.42 mg/dL (H)). Liver Function Tests: Recent Labs  Lab 11/02/20 1236 11/03/20 0349 11/04/20 0422  AST 74* 133* 92*  ALT 50* 86* 97*  ALKPHOS 146* 129* 144*  BILITOT 1.3* 1.0 0.8  PROT 8.1 7.0 6.8  ALBUMIN 3.4* 3.0* 2.7*   No results for input(s): LIPASE, AMYLASE in the last 168 hours. No results for input(s): AMMONIA in the last 168 hours. Coagulation Profile: Recent Labs  Lab 11/02/20 1644 11/03/20 0349  INR 1.5* 1.4*   Cardiac Enzymes: No results for input(s):  CKTOTAL, CKMB, CKMBINDEX, TROPONINI in the last 168 hours. BNP (last 3 results) No results for input(s): PROBNP in the last 8760 hours. HbA1C: No results for input(s): HGBA1C in the last 72 hours. CBG: Recent Labs  Lab 11/02/20 1555 11/02/20 1631  GLUCAP 60* 80   Lipid Profile: No results for input(s): CHOL, HDL, LDLCALC, TRIG, CHOLHDL, LDLDIRECT in the last 72 hours. Thyroid Function Tests: Recent Labs    11/02/20 1237  TSH 1.406   Anemia Panel: Recent Labs    11/03/20 0349  VITAMINB12 853   Sepsis Labs: Recent Labs  Lab 11/02/20 1209 11/02/20 1611 11/02/20 1739  PROCALCITON 53.11  --   --   LATICACIDVEN  --  3.1* 1.9    Recent Results (from the past 240 hour(s))  Respiratory Panel by RT PCR (Flu A&B, Covid) - Nasopharyngeal Swab     Status: None   Collection Time: 11/02/20  3:32 PM   Specimen: Nasopharyngeal Swab  Result Value Ref Range Status   SARS Coronavirus 2 by RT PCR NEGATIVE NEGATIVE Final    Comment: (NOTE) SARS-CoV-2 target nucleic acids are NOT DETECTED.  The SARS-CoV-2 RNA is generally detectable in upper respiratoy specimens during the acute phase of infection. The lowest concentration of SARS-CoV-2 viral copies this assay can detect is 131 copies/mL. A  negative result does not preclude SARS-Cov-2 infection and should not be used as the sole basis for treatment or other patient management decisions. A negative result may occur with  improper specimen collection/handling, submission of specimen other than nasopharyngeal swab, presence of viral mutation(s) within the areas targeted by this assay, and inadequate number of viral copies (<131 copies/mL). A negative result must be combined with clinical observations, patient history, and epidemiological information. The expected result is Negative.  Fact Sheet for Patients:  PinkCheek.be  Fact Sheet for Healthcare Providers:  GravelBags.it  This test is no t yet approved or cleared by the [place] FDA and  has been authorized for detection and/or diagnosis of SARS-CoV-2 by FDA under an Emergency Use Authorization (EUA). This EUA will remain  in effect (meaning this test can be used) for the duration of the COVID-19 declaration under Section 564(b)(1) of the Act, 21 U.S.C. section 360bbb-3(b)(1), unless the authorization is terminated or revoked sooner.     Influenza A by PCR NEGATIVE NEGATIVE Final   Influenza B by PCR NEGATIVE NEGATIVE Final    Comment: (NOTE) The Xpert Xpress SARS-CoV-2/FLU/RSV assay is intended as an aid in  the diagnosis of influenza from Nasopharyngeal swab specimens and  should not be used as a sole basis for treatment. Nasal washings and  aspirates are unacceptable for Xpert Xpress SARS-CoV-2/FLU/RSV  testing.  Fact Sheet for Patients: PinkCheek.be  Fact Sheet for Healthcare Providers: GravelBags.it  This test is not yet approved or cleared by the [place] FDA and  has been authorized for detection and/or diagnosis of SARS-CoV-2 by  FDA under an Emergency Use Authorization (EUA). This EUA will remain  in effect (meaning this test can  be used) for the duration of the  Covid-19 declaration under Section 564(b)(1) of the Act, 21  U.S.C. section 360bbb-3(b)(1), unless the authorization is  terminated or revoked. Performed at [facility], [address] [place], [place] [address]   Blood Culture (routine x 2)     Status: Abnormal (Preliminary result)   Collection Time: 11/02/20  4:44 PM   Specimen: BLOOD  Result Value Ref Range Status   Specimen Description  Final    BLOOD RIGHT ANTECUBITAL Performed at [facility], [address] [place], [place] [address]    Special Requests   Final    BOTTLES DRAWN AEROBIC AND ANAEROBIC Blood Culture results may not be optimal due to an inadequate volume of blood received in culture bottles Performed at [facility], [address] [place], [place] [address]    Culture  Setup Time   Final    GRAM POSITIVE COCCI IN BOTH AEROBIC AND ANAEROBIC BOTTLES CRITICAL RESULT CALLED TO, READ BACK BY AND VERIFIED WITH: JASON ROBBINS AT 0981 11/03/20 SDR    Culture (A)  Final    ENTEROCOCCUS FAECALIS SUSCEPTIBILITIES TO FOLLOW Performed at [facility], [place] [address], [place], [place] [address]    Report Status PENDING  Incomplete  Blood Culture ID Panel (Reflexed)     Status: Abnormal   Collection Time: 11/02/20  4:44 PM  Result Value Ref Range Status   Enterococcus faecalis DETECTED (A) NOT DETECTED Final    Comment: CRITICAL RESULT CALLED TO, READ BACK BY AND VERIFIED WITH: JASON ROBBONS AT 8295 11/03/20 SDR    Enterococcus Faecium NOT DETECTED NOT DETECTED Final   Listeria monocytogenes NOT DETECTED NOT DETECTED Final   Staphylococcus species NOT DETECTED NOT DETECTED Final   Staphylococcus aureus (BCID) NOT DETECTED NOT DETECTED Final   Staphylococcus epidermidis NOT DETECTED NOT DETECTED Final   Staphylococcus lugdunensis NOT DETECTED NOT DETECTED Final   Streptococcus species NOT DETECTED NOT DETECTED Final   Streptococcus agalactiae  NOT DETECTED NOT DETECTED Final   Streptococcus pneumoniae NOT DETECTED NOT DETECTED Final   Streptococcus pyogenes NOT DETECTED NOT DETECTED Final   A.calcoaceticus-baumannii NOT DETECTED NOT DETECTED Final   Bacteroides fragilis NOT DETECTED NOT DETECTED Final   Enterobacterales NOT DETECTED NOT DETECTED Final   Enterobacter cloacae complex NOT DETECTED NOT DETECTED Final   Escherichia coli NOT DETECTED NOT DETECTED Final   Klebsiella aerogenes NOT DETECTED NOT DETECTED Final   Klebsiella oxytoca NOT DETECTED NOT DETECTED Final   Klebsiella pneumoniae NOT DETECTED NOT DETECTED Final   Proteus species NOT DETECTED NOT DETECTED Final   Salmonella species NOT DETECTED NOT DETECTED Final   Serratia marcescens NOT DETECTED NOT DETECTED Final   Haemophilus influenzae NOT DETECTED NOT DETECTED Final   Neisseria meningitidis NOT DETECTED NOT DETECTED Final   Pseudomonas aeruginosa NOT DETECTED NOT DETECTED Final   Stenotrophomonas maltophilia NOT DETECTED NOT DETECTED Final   Candida albicans NOT DETECTED NOT DETECTED Final   Candida auris NOT DETECTED NOT DETECTED Final   Candida glabrata NOT DETECTED NOT DETECTED Final   Candida krusei NOT DETECTED NOT DETECTED Final   Candida parapsilosis NOT DETECTED NOT DETECTED Final   Candida tropicalis NOT DETECTED NOT DETECTED Final   Cryptococcus neoformans/gattii NOT DETECTED NOT DETECTED Final   Vancomycin resistance NOT DETECTED NOT DETECTED Final    Comment: Performed at [facility], [address] [place], [place] [address]  Blood Culture (routine x 2)     Status: None (Preliminary result)   Collection Time: 11/02/20  4:49 PM   Specimen: BLOOD  Result Value Ref Range Status   Specimen Description BLOOD BLOOD RIGHT WRIST  Final   Special Requests   Final    BOTTLES DRAWN AEROBIC AND ANAEROBIC Blood Culture results may not be optimal due to an excessive volume of blood received in culture bottles   Culture  Setup Time   Final     GRAM POSITIVE COCCI  IN BOTH AEROBIC AND ANAEROBIC BOTTLES CRITICAL VALUE NOTED.  VALUE IS CONSISTENT WITH PREVIOUSLY REPORTED AND CALLED VALUE. Performed at [facility], [address] [place], [place] [address]    Culture [facility] POSITIVE COCCI  Final   Report Status PENDING  Incomplete  Body fluid culture     Status: None (Preliminary result)   Collection Time: 11/03/20  9:35 AM   Specimen: Peritoneal Dialysate  Result Value Ref Range Status   Specimen Description   Final    PERITONEAL DIALYSATE Performed at [facility], [address] [place], [place] [address]    Special Requests   Final    NONE Performed at [facility], [address] [place], [place] [address]    Gram Stain   Final    RARE WBC PRESENT,BOTH PMN AND MONONUCLEAR NO ORGANISMS SEEN    Culture   Final    NO GROWTH < 24 HOURS Performed at [facility], [place] [address], [place], [place] [address]    Report Status PENDING  Incomplete  Gastrointestinal Panel by PCR , Stool     Status: Abnormal   Collection Time: 11/03/20  9:38 PM   Specimen: STOOL  Result Value Ref Range Status   Campylobacter species NOT DETECTED NOT DETECTED Final   Plesimonas shigelloides DETECTED (A) NOT DETECTED Final    Comment: RESULT CALLED TO, READ BACK BY AND VERIFIED WITH: SKYLAR HARDIN RN 2323 11/03/20 HNM    Salmonella species NOT DETECTED NOT DETECTED Final   Yersinia enterocolitica NOT DETECTED NOT DETECTED Final   Vibrio species NOT DETECTED NOT DETECTED Final   Vibrio cholerae NOT DETECTED NOT DETECTED Final   Enteroaggregative E coli (EAEC) NOT DETECTED NOT DETECTED Final   Enteropathogenic E coli (EPEC) NOT DETECTED NOT DETECTED Final   Enterotoxigenic E coli (ETEC) NOT DETECTED NOT DETECTED Final   Shiga like toxin producing E coli (STEC) NOT DETECTED NOT DETECTED Final   Shigella/Enteroinvasive E coli (EIEC) NOT DETECTED NOT DETECTED Final   Cryptosporidium NOT DETECTED NOT DETECTED  Final   Cyclospora cayetanensis NOT DETECTED NOT DETECTED Final   Entamoeba histolytica NOT DETECTED NOT DETECTED Final   Giardia lamblia NOT DETECTED NOT DETECTED Final   Adenovirus F40/41 NOT DETECTED NOT DETECTED Final   Astrovirus NOT DETECTED NOT DETECTED Final   Norovirus GI/GII NOT DETECTED NOT DETECTED Final   Rotavirus A NOT DETECTED NOT DETECTED Final   Sapovirus (I, II, IV, and V) NOT DETECTED NOT DETECTED Final    Comment: Performed at [facility], [address] [place], [place] [address]  C Difficile Quick Screen w PCR reflex     Status: None   Collection Time: 11/03/20  9:39 PM   Specimen: STOOL  Result Value Ref Range Status   C Diff antigen NEGATIVE NEGATIVE Final   C Diff toxin NEGATIVE NEGATIVE Final   C Diff interpretation No C. difficile detected.  Final    Comment: Performed at [facility], [address] [place],  [address]      Radiology Studies: DG Chest 1 View  Result Date: 11/02/2020 CLINICAL DATA:  Chills during dialysis EXAM: CHEST  1 VIEW COMPARISON:  03/03/2020 FINDINGS: Single frontal view of the chest demonstrates right internal jugular dialysis catheter tip overlying superior vena cava. Cardiac silhouette remains enlarged. There is central vascular congestion with mild diffuse interstitial prominence. No airspace disease, effusion, or pneumothorax. No acute bony abnormality. IMPRESSION: 1. Vascular congestion and mild interstitial edema. 2.  Stable enlarged cardiac silhouette. Electronically Signed   By: Randa Ngo M.D.   On: 11/02/2020 15:49   US Abdomen Complete  Result Date: 11/03/2020 CLINICAL DATA:  RIGHT upper quadrant abdominal pain; history end-stage renal disease on dialysis, smoker, COPD, CHF at EXAM: ABDOMEN ULTRASOUND COMPLETE COMPARISON:  11/12/2019 FINDINGS: Gallbladder: Mildly thickened gallbladder wall, a nonspecific finding in the setting of ascites. Small amount of dependent sludge within gallbladder. No  shadowing calculi or sonographic Murphy sign. Common bile duct: Diameter: 3 mm, normal Liver: Normal echogenicity without mass. Hepatic margins appear minimally nodular raising question of cirrhosis. Portal vein is patent on color Doppler imaging with normal direction of blood flow towards the liver. IVC: Normal appearance Pancreas: Normal appearance. Pancreatic duct upper normal caliber 2.7 mm. Spleen: Normal appearance, 11.6 cm length. Few collaterals at splenic hilum. Right Kidney: Length: 9.5 cm. Cortical thinning and atrophy. Increased cortical echogenicity. Small cyst at upper pole 14 x 12 x 14 mm. No additional masses or hydronephrosis. Left Kidney: Length: 9.0 cm. Cortical thinning. Increased cortical echogenicity. Tiny cyst at mid upper kidney 6 x 6 x 6 mm. No additional mass or hydronephrosis Abdominal aorta: Normal caliber Other findings: Minimal scattered ascites. IMPRESSION: Cortical thinning and medical renal disease changes of both kidneys with tiny renal cysts. Minimal sludge within gallbladder. Minimally nodular hepatic margins raising question of cirrhosis. Minimal scattered ascites Electronically Signed   By: Lavonia Dana M.D.   On: 11/03/2020 10:58   PERIPHERAL VASCULAR CATHETERIZATION  Result Date: 11/03/2020 See op note  US Paracentesis  Result Date: 11/03/2020 INDICATION: History of end-stage renal disease, on dialysis with recurrent symptomatic ascites. Please perform ultrasound-guided paracentesis for therapeutic purposes. EXAM: ULTRASOUND-GUIDED PARACENTESIS COMPARISON:  Multiple previous ultrasound-guided paracenteses, most recently 09/20/2020 yielding 4.9 L of peritoneal fluid. MEDICATIONS: None. COMPLICATIONS: None immediate. TECHNIQUE: Informed written consent was obtained from the patient after a discussion of the risks, benefits and alternatives to treatment. A timeout was performed prior to the initiation of the procedure. Initial ultrasound scanning demonstrates a small  amount of ascites within the left lower abdomen which was subsequently prepped and draped in the usual sterile fashion. 1% lidocaine with epinephrine was used for local anesthesia. Under direct ultrasound guidance, a 19 gauge, 7-cm, Yueh catheter was introduced. An ultrasound image was saved for documentation purposed. The paracentesis was performed. The catheter was removed and a dressing was applied. The patient tolerated the procedure well without immediate post procedural complication. FINDINGS: A total of approximately 3.2 liters of serous fluid was removed. IMPRESSION: Successful ultrasound-guided paracentesis yielding 3.2 liters of peritoneal fluid. Electronically Signed   By: Sandi Mariscal M.D.   On: 11/03/2020 11:02      Scheduled Meds: . amLODipine  10 mg Oral Daily  . Chlorhexidine Gluconate Cloth  6 each Topical Daily  . DULoxetine  60 mg Oral BID  . epoetin (EPOGEN/PROCRIT) injection  10,000 Units Intravenous Q M,W,F-HD  . heparin  5,000 Units Subcutaneous Q8H  . metoprolol tartrate  50 mg Oral BID  . mometasone-formoterol  2 puff Inhalation BID  . multivitamin  1 tablet Oral Daily  . nicotine  21 mg Transdermal Daily  . pantoprazole  40 mg Oral Daily  . sodium chloride flush  3 mL Intravenous Q12H  . torsemide  100 mg Oral Daily  . [START ON 11/06/2020] Vitamin D (Ergocalciferol)  50,000 Units Oral Q Sun   Continuous Infusions: . sodium chloride    . albumin human    . ampicillin (  OMNIPEN) IV Stopped (11/04/20 0839)     LOS: 1 day      Time spent: 25 minutes   Dessa Phi, DO [facility] 11/04/2020, 9:44 AM   Available via Epic secure chat 7am-7pm After these hours, please refer to coverage provider listed on [email or web address]

## 2020-11-05 ENCOUNTER — Inpatient Hospital Stay: Payer: Medicare Other

## 2020-11-05 ENCOUNTER — Inpatient Hospital Stay (HOSPITAL_COMMUNITY)
Admit: 2020-11-05 | Discharge: 2020-11-05 | Disposition: A | Discharge status: Home / Self Care | Payer: Medicare Other | Attending: Cardiovascular Disease | Admitting: Cardiovascular Disease

## 2020-11-05 DIAGNOSIS — R7881 Bacteremia: Secondary | ICD-10-CM

## 2020-11-05 DIAGNOSIS — I340 Nonrheumatic mitral (valve) insufficiency: Secondary | ICD-10-CM | POA: Diagnosis not present

## 2020-11-05 DIAGNOSIS — K529 Noninfective gastroenteritis and colitis, unspecified: Secondary | ICD-10-CM | POA: Diagnosis not present

## 2020-11-05 DIAGNOSIS — I371 Nonrheumatic pulmonary valve insufficiency: Secondary | ICD-10-CM

## 2020-11-05 DIAGNOSIS — I10 Essential (primary) hypertension: Secondary | ICD-10-CM | POA: Diagnosis not present

## 2020-11-05 DIAGNOSIS — B952 Enterococcus as the cause of diseases classified elsewhere: Secondary | ICD-10-CM | POA: Diagnosis not present

## 2020-11-05 LAB — CBC
HCT: 26 % — ABNORMAL LOW (ref 36.0–46.0)
Hemoglobin: 8.2 g/dL — ABNORMAL LOW (ref 12.0–15.0)
MCH: 26.1 pg (ref 26.0–34.0)
MCHC: 31.5 g/dL (ref 30.0–36.0)
MCV: 82.8 fL (ref 80.0–100.0)
Platelets: 137 10*3/uL — ABNORMAL LOW (ref 150–400)
RBC: 3.14 MIL/uL — ABNORMAL LOW (ref 3.87–5.11)
RDW: 14.9 % (ref 11.5–15.5)
WBC: 8.2 10*3/uL (ref 4.0–10.5)
nRBC: 0.2 % (ref 0.0–0.2)

## 2020-11-05 LAB — COMPREHENSIVE METABOLIC PANEL
ALT: 78 U/L — ABNORMAL HIGH (ref 0–44)
AST: 50 U/L — ABNORMAL HIGH (ref 15–41)
Albumin: 2.7 g/dL — ABNORMAL LOW (ref 3.5–5.0)
Alkaline Phosphatase: 148 U/L — ABNORMAL HIGH (ref 38–126)
Anion gap: 6 (ref 5–15)
BUN: 32 mg/dL — ABNORMAL HIGH (ref 6–20)
CO2: 29 mmol/L (ref 22–32)
Calcium: 8.3 mg/dL — ABNORMAL LOW (ref 8.9–10.3)
Chloride: 97 mmol/L — ABNORMAL LOW (ref 98–111)
Creatinine, Ser: 5.08 mg/dL — ABNORMAL HIGH (ref 0.44–1.00)
GFR, Estimated: 10 mL/min — ABNORMAL LOW (ref >60)
Glucose, Bld: 109 mg/dL — ABNORMAL HIGH (ref 70–99)
Potassium: 3.8 mmol/L (ref 3.5–5.1)
Sodium: 132 mmol/L — ABNORMAL LOW (ref 135–145)
Total Bilirubin: 0.8 mg/dL (ref 0.3–1.2)
Total Protein: 6.9 g/dL (ref 6.5–8.1)

## 2020-11-05 LAB — ECHOCARDIOGRAM COMPLETE
Height: 66 in
MV M vel: 5.6 m/s
MV Peak grad: 125.4 mmHg
S' Lateral: 3.21 cm
Weight: 2720 oz

## 2020-11-05 LAB — CULTURE, BLOOD (ROUTINE X 2)

## 2020-11-05 LAB — HEPATITIS PANEL, ACUTE
HCV Ab: NONREACTIVE
Hep A IgM: NONREACTIVE
Hep B C IgM: NONREACTIVE
Hepatitis B Surface Ag: NONREACTIVE

## 2020-11-05 MED ORDER — LIDOCAINE-PRILOCAINE 2.5-2.5 % EX CREA
TOPICAL_CREAM | Freq: Every day | CUTANEOUS | Status: DC | PRN
  Filled 2020-11-05: qty 5

## 2020-11-05 NOTE — Progress Notes (Signed)
Patient left unit unattended by staff. Did not notify nurse. Charge nurse, AC, and security made aware. Patient was located and bought back to her room. Patient is now in room in bed. Will continue to monitor.

## 2020-11-05 NOTE — Progress Notes (Signed)
Patient signed AMA paper and left unit. RN explained risk of leaving without appropriate treatment. Also advised her to visit ED if her condition worsens. Patient understands risks of leaving before treatment rendered. Charge nurse and provider aware of patient's decision. All PIV lines removed intact, and belongings returned to patient.

## 2020-11-05 NOTE — Progress Notes (Signed)
PROGRESS NOTE    Janet Olson  GYF:[id number] DOB: 08-05-1970 DOA: 11/02/2020 PCP: Remi Haggard, FNP     Brief Narrative:  Janet Olson is a 50 y.o. female with medical history significant for hypertension, chronic daily tobacco use, end-stage renal disease on hemodialysis, anxiety/depression, chronic daily diarrhea, presents to the emergency department via EMS for nonspecific concerns including numbness in extremities and pain, chills. She reports the symptoms started during dialysis.  Patient underwent paracentesis with 3.2 L fluid removal.  Blood culture returned positive for Enterococcus bacteremia.  She was started on IV ampicillin.  Infectious disease consulted.  Tunneled hemodialysis catheter removed on 11/4.  New events last 24 hours / Subjective: States that she is having some in the left upper arm at the AV fistula site.  She states that she tolerated hemodialysis okay yesterday without any issues with fistula use.  No other physical complaints today.  Assessment & Plan:   Principal Problem:   Sepsis ([place]) Active Problems:   Anemia in chronic kidney disease   COPD (chronic obstructive pulmonary disease) (HCC)   ESRD on dialysis (HCC)   Hyperkalemia   Abdominal ascites   Tobacco use   Right upper quadrant abdominal pain   Mass of ear canal, left   Enterococcus faecalis infection    Sepsis secondary to Enterococcus bacteremia -Sepsis not present on admission - VS with HR 83, RR 18, WBC 11.6, T 98.30F at time of admission. Then with RR in 20s with elevated WBC after admission.  -Right chest tunneled hemodialysis catheter removed 11/4 -Continue ampicillin -Infectious disease following -Echocardiogram pending, TEE tentatively scheduled for Monday   Acute on chronic diarrhea -C. 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 Fluid studies cell count negative for peritonitis -Abdominal pain resolved post paracentesis  ESRD on HD MWF -Nephrology following -Continue torsemide  Essential hypertension -Continue amlodipine, lopressor, torsemide  Anxiety/depression -Continue Cymbalta, hydroxyzine, quetiapine  GERD -Continue Protonix   Left ear canal growth  -Follow-up ENT outpatient   LUE pain -Check venous US    DVT prophylaxis:  heparin injection 5,000 Units Start: 11/02/20 2200 Place TED hose Start: 11/02/20 1734  Code Status: Full Family Communication: No family at bedside Disposition Plan:  Status is: Inpatient  Remains inpatient appropriate because:Ongoing diagnostic testing needed not appropriate for outpatient work up and IV treatments appropriate due to intensity of illness or inability to take PO   Dispo: The patient is from: Home              Anticipated d/c is to: Home              Anticipated d/c date is: > 3 days              Patient currently is not medically stable to d/c.  Remains on IV antibiotics.  Echocardiogram and TEE pending     Consultants:   Nephrology  Vascular surgery  ID  Procedures:   Paracentesis 11/4 with 3.2L fluid removal   Tunneled hemodialysis catheter removed 11/4  Antimicrobials:  Anti-infectives (From admission, onward)   Start     Dose/Rate Route Frequency Ordered Stop   11/03/20 0830  ampicillin (OMNIPEN) 2 g in sodium chloride 0.9 % 100 mL IVPB  2 g 300 mL/hr over 20 Minutes Intravenous Every 12 hours 11/03/20 0807     11/03/20 0800  ampicillin (OMNIPEN) 2 g in sodium chloride 0.9 % 100 mL IVPB  Status:  Discontinued        2 g 300 mL/hr over 20 Minutes Intravenous Every 4 hours 11/03/20 0547 11/03/20 0807   11/02/20 1645  vancomycin (VANCOCIN) IVPB 1000 mg/200 mL premix  Status:   Discontinued        1,000 mg 200 mL/hr over 60 Minutes Intravenous  Once 11/02/20 1644 11/03/20 0545   11/02/20 1645  ceFEPIme (MAXIPIME) 2 g in sodium chloride 0.9 % 100 mL IVPB        2 g 200 mL/hr over 30 Minutes Intravenous  Once 11/02/20 1644 11/02/20 1821   11/02/20 1645  azithromycin (ZITHROMAX) 500 mg in sodium chloride 0.9 % 250 mL IVPB  Status:  Discontinued        500 mg 250 mL/hr over 60 Minutes Intravenous  Once 11/02/20 1644 11/03/20 0546       Objective: Vitals:   11/04/20 1454 11/04/20 2041 11/05/20 0427 11/05/20 0824  BP: 140/86 (!) 142/79 132/80 130/80  Pulse: 92 99 86 87  Resp: 18 17 18 16   Temp: 98 F (36.7 C) 98.1 F (36.7 C) 98.2 F (36.8 C) 98.4 F (36.9 C)  TempSrc: Axillary Oral Oral Oral  SpO2: 95% 98% 94% 100%  Weight:      Height:        Intake/Output Summary (Last 24 hours) at 11/05/2020 1009 Last data filed at 11/04/2020 2225 Gross per 24 hour  Intake 123 ml  Output 1941 ml  Net -1818 ml   Filed Weights   11/02/20 1224 11/03/20 1544  Weight: 82.2 kg 77.1 kg    Examination: General exam: Appears calm and comfortable  Respiratory system: Clear to auscultation. Respiratory effort normal. Cardiovascular system: S1 & S2 heard, RRR. No pedal edema. Gastrointestinal system: Abdomen is nondistended, soft and nontender. Normal bowel sounds heard. Central nervous system: Alert and oriented. Non focal exam. Speech clear  Skin: No rashes, lesions or ulcers on exposed skin  Psychiatry: Judgement and insight appear stable. Mood & affect appropriate.   Data Reviewed: I have personally reviewed following labs and imaging studies  CBC: Recent Labs  Lab 11/02/20 1230 11/03/20 0349 11/04/20 0422 11/05/20 0342  WBC 11.6* 13.4* 9.6 8.2  HGB 9.6* 8.6* 8.0* 8.2*  HCT 30.4* 26.6* 25.8* 26.0*  MCV 83.7 81.6 83.2 82.8  PLT 175 140* 124* 259*   Basic Metabolic Panel: Recent Labs  Lab 11/02/20 1230 11/03/20 0349 11/04/20 0422 11/05/20 0342  NA  131* 134* 128* 132*  K 5.3* 4.2 4.5 3.8  CL 90* 94* 91* 97*  CO2 21* 26 23 29   GLUCOSE 98 156* 103* 109*  BUN 54* 34* 55* 32*  CREATININE 7.31* 5.21* 7.42* 5.08*  CALCIUM 9.2 8.7* 8.5* 8.3*  MG 1.6*  --   --   --    GFR: Estimated Creatinine Clearance: 13.6 mL/min (A) (by C-G formula based on SCr of 5.08 mg/dL (H)). Liver Function Tests: Recent Labs  Lab 11/02/20 1236 11/03/20 0349 11/04/20 0422 11/05/20 0342  AST 74* 133* 92* 50*  ALT 50* 86* 97* 78*  ALKPHOS 146* 129* 144* 148*  BILITOT 1.3* 1.0 0.8 0.8  PROT 8.1 7.0 6.8 6.9  ALBUMIN 3.4* 3.0* 2.7* 2.7*   No results for input(s): LIPASE, AMYLASE in the last 168 hours. No results for input(s): AMMONIA  in the last 168 hours. Coagulation Profile: Recent Labs  Lab 11/02/20 1644 11/03/20 0349  INR 1.5* 1.4*   Cardiac Enzymes: No results for input(s): CKTOTAL, CKMB, CKMBINDEX, TROPONINI in the last 168 hours. BNP (last 3 results) No results for input(s): PROBNP in the last 8760 hours. HbA1C: No results for input(s): HGBA1C in the last 72 hours. CBG: Recent Labs  Lab 11/02/20 1555 11/02/20 1631  GLUCAP 60* 80   Lipid Profile: No results for input(s): CHOL, HDL, LDLCALC, TRIG, CHOLHDL, LDLDIRECT in the last 72 hours. Thyroid Function Tests: Recent Labs    11/02/20 1237  TSH 1.406   Anemia Panel: Recent Labs    11/03/20 0349  VITAMINB12 853   Sepsis Labs: Recent Labs  Lab 11/02/20 1209 11/02/20 1611 11/02/20 1739  PROCALCITON 53.11  --   --   LATICACIDVEN  --  3.1* 1.9    Recent Results (from the past 240 hour(s))  Respiratory Panel by RT PCR (Flu A&B, Covid) - Nasopharyngeal Swab     Status: None   Collection Time: 11/02/20  3:32 PM   Specimen: Nasopharyngeal Swab  Result Value Ref Range Status   SARS Coronavirus 2 by RT PCR NEGATIVE NEGATIVE Final    Comment: (NOTE) SARS-CoV-2 target nucleic acids are NOT DETECTED.  The SARS-CoV-2 RNA is generally detectable in upper respiratoy specimens  during the acute phase of infection. The lowest concentration of SARS-CoV-2 viral copies this assay can detect is 131 copies/mL. A negative result does not preclude SARS-Cov-2 infection and should not be used as the sole basis for treatment or other patient management decisions. A negative result may occur with  improper specimen collection/handling, submission of specimen other than nasopharyngeal swab, presence of viral mutation(s) within the areas targeted by this assay, and inadequate number of viral copies (<131 copies/mL). A negative result must be combined with clinical observations, patient history, and epidemiological information. The expected result is Negative.  Fact Sheet for Patients:  PinkCheek.be  Fact Sheet for Healthcare Providers:  GravelBags.it  This test is no t yet approved or cleared by the [place] FDA and  has been authorized for detection and/or diagnosis of SARS-CoV-2 by FDA under an Emergency Use Authorization (EUA). This EUA will remain  in effect (meaning this test can be used) for the duration of the COVID-19 declaration under Section 564(b)(1) of the Act, 21 U.S.C. section 360bbb-3(b)(1), unless the authorization is terminated or revoked sooner.     Influenza A by PCR NEGATIVE NEGATIVE Final   Influenza B by PCR NEGATIVE NEGATIVE Final    Comment: (NOTE) The Xpert Xpress SARS-CoV-2/FLU/RSV assay is intended as an aid in  the diagnosis of influenza from Nasopharyngeal swab specimens and  should not be used as a sole basis for treatment. Nasal washings and  aspirates are unacceptable for Xpert Xpress SARS-CoV-2/FLU/RSV  testing.  Fact Sheet for Patients: PinkCheek.be  Fact Sheet for Healthcare Providers: GravelBags.it  This test is not yet approved or cleared by the [place] FDA and  has been authorized for detection and/or  diagnosis of SARS-CoV-2 by  FDA under an Emergency Use Authorization (EUA). This EUA will remain  in effect (meaning this test can be used) for the duration of the  Covid-19 declaration under Section 564(b)(1) of the Act, 21  U.S.C. section 360bbb-3(b)(1), unless the authorization is  terminated or revoked. Performed at [facility], [address], [place], [place] [address]   Blood Culture (routine x 2)     Status:  Abnormal   Collection Time: 11/02/20  4:44 PM   Specimen: BLOOD  Result Value Ref Range Status   Specimen Description   Final    BLOOD RIGHT ANTECUBITAL Performed at [facility], [address] [place], [place] [address]    Special Requests   Final    BOTTLES DRAWN AEROBIC AND ANAEROBIC Blood Culture results may not be optimal due to an inadequate volume of blood received in culture bottles Performed at [facility], [address], [place], [place] [address]    Culture  Setup Time   Final    GRAM POSITIVE COCCI IN BOTH AEROBIC AND ANAEROBIC BOTTLES CRITICAL RESULT CALLED TO, READ BACK BY AND VERIFIED WITHVioleta Gelinas AT 4818 11/03/20 SDR Performed at [facility], [place] [address] [place], [place] [address]    Culture ENTEROCOCCUS FAECALIS (A)  Final   Report Status 11/05/2020 FINAL  Final   Organism ID, Bacteria ENTEROCOCCUS FAECALIS  Final      Susceptibility   Enterococcus faecalis - MIC*    AMPICILLIN <=2 SENSITIVE Sensitive     VANCOMYCIN 1 SENSITIVE Sensitive     GENTAMICIN SYNERGY SENSITIVE Sensitive     * ENTEROCOCCUS FAECALIS  Blood Culture ID Panel (Reflexed)     Status: Abnormal   Collection Time: 11/02/20  4:44 PM  Result Value Ref Range Status   Enterococcus faecalis DETECTED (A) NOT DETECTED Final    Comment: CRITICAL RESULT CALLED TO, READ BACK BY AND VERIFIED WITH: JASON ROBBONS AT 9702 11/03/20 SDR    Enterococcus Faecium NOT DETECTED NOT DETECTED Final   Listeria monocytogenes NOT DETECTED NOT DETECTED  Final   Staphylococcus species NOT DETECTED NOT DETECTED Final   Staphylococcus aureus (BCID) NOT DETECTED NOT DETECTED Final   Staphylococcus epidermidis NOT DETECTED NOT DETECTED Final   Staphylococcus lugdunensis NOT DETECTED NOT DETECTED Final   Streptococcus species NOT DETECTED NOT DETECTED Final   Streptococcus agalactiae NOT DETECTED NOT DETECTED Final   Streptococcus pneumoniae NOT DETECTED NOT DETECTED Final   Streptococcus pyogenes NOT DETECTED NOT DETECTED Final   A.calcoaceticus-baumannii NOT DETECTED NOT DETECTED Final   Bacteroides fragilis NOT DETECTED NOT DETECTED Final   Enterobacterales NOT DETECTED NOT DETECTED Final   Enterobacter cloacae complex NOT DETECTED NOT DETECTED Final   Escherichia coli NOT DETECTED NOT DETECTED Final   Klebsiella aerogenes NOT DETECTED NOT DETECTED Final   Klebsiella oxytoca NOT DETECTED NOT DETECTED Final   Klebsiella pneumoniae NOT DETECTED NOT DETECTED Final   Proteus species NOT DETECTED NOT DETECTED Final   Salmonella species NOT DETECTED NOT DETECTED Final   Serratia marcescens NOT DETECTED NOT DETECTED Final   Haemophilus influenzae NOT DETECTED NOT DETECTED Final   Neisseria meningitidis NOT DETECTED NOT DETECTED Final   Pseudomonas aeruginosa NOT DETECTED NOT DETECTED Final   Stenotrophomonas maltophilia NOT DETECTED NOT DETECTED Final   Candida albicans NOT DETECTED NOT DETECTED Final   Candida auris NOT DETECTED NOT DETECTED Final   Candida glabrata NOT DETECTED NOT DETECTED Final   Candida krusei NOT DETECTED NOT DETECTED Final   Candida parapsilosis NOT DETECTED NOT DETECTED Final   Candida tropicalis NOT DETECTED NOT DETECTED Final   Cryptococcus neoformans/gattii NOT DETECTED NOT DETECTED Final   Vancomycin resistance NOT DETECTED NOT DETECTED Final    Comment: Performed at [facility], [address] [place], [place] [address]  Blood Culture (routine x 2)     Status: Abnormal   Collection Time: 11/02/20   4:49 PM  Specimen: BLOOD  Result Value Ref Range Status   Specimen Description   Final    BLOOD BLOOD RIGHT WRIST Performed at [facility], [address] [place], [place] [address]    Special Requests   Final    BOTTLES DRAWN AEROBIC AND ANAEROBIC Blood Culture results may not be optimal due to an excessive volume of blood received in culture bottles Performed at [facility], [address], [place], [place] [address]    Culture  Setup Time   Final    GRAM POSITIVE COCCI IN BOTH AEROBIC AND ANAEROBIC BOTTLES CRITICAL VALUE NOTED.  VALUE IS CONSISTENT WITH PREVIOUSLY REPORTED AND CALLED VALUE. Performed at [facility], [address], [place], [place] [address]    Culture (A)  Final    ENTEROCOCCUS FAECALIS SUSCEPTIBILITIES PERFORMED ON PREVIOUS CULTURE WITHIN THE LAST 5 DAYS. Performed at [facility], [place] [address], [place], [place] [address]    Report Status 11/05/2020 FINAL  Final  Body fluid culture     Status: None (Preliminary result)   Collection Time: 11/03/20  9:35 AM   Specimen: Peritoneal Dialysate  Result Value Ref Range Status   Specimen Description   Final    PERITONEAL DIALYSATE Performed at [facility], [address] [place], [place] [address]    Special Requests   Final    NONE Performed at [facility], [address] [place], [place] [address]    Gram Stain   Final    RARE WBC PRESENT,BOTH PMN AND MONONUCLEAR NO ORGANISMS SEEN    Culture   Final    NO GROWTH 2 DAYS Performed at [facility], [place] [address], [place], [place] [address]    Report Status PENDING  Incomplete  Gastrointestinal Panel by PCR , Stool     Status: Abnormal   Collection Time: 11/03/20  9:38 PM   Specimen: STOOL  Result Value Ref Range Status   Campylobacter species NOT DETECTED NOT DETECTED Final   Plesimonas shigelloides DETECTED (A) NOT DETECTED Final    Comment: RESULT CALLED TO, READ BACK BY AND  VERIFIED WITH: SKYLAR HARDIN RN 2323 11/03/20 HNM    Salmonella species NOT DETECTED NOT DETECTED Final   Yersinia enterocolitica NOT DETECTED NOT DETECTED Final   Vibrio species NOT DETECTED NOT DETECTED Final   Vibrio cholerae NOT DETECTED NOT DETECTED Final   Enteroaggregative E coli (EAEC) NOT DETECTED NOT DETECTED Final   Enteropathogenic E coli (EPEC) NOT DETECTED NOT DETECTED Final   Enterotoxigenic E coli (ETEC) NOT DETECTED NOT DETECTED Final   Shiga like toxin producing E coli (STEC) NOT DETECTED NOT DETECTED Final   Shigella/Enteroinvasive E coli (EIEC) NOT DETECTED NOT DETECTED Final   Cryptosporidium NOT DETECTED NOT DETECTED Final   Cyclospora cayetanensis NOT DETECTED NOT DETECTED Final   Entamoeba histolytica NOT DETECTED NOT DETECTED Final   Giardia lamblia NOT DETECTED NOT DETECTED Final   Adenovirus F40/41 NOT DETECTED NOT DETECTED Final   Astrovirus NOT DETECTED NOT DETECTED Final   Norovirus GI/GII NOT DETECTED NOT DETECTED Final   Rotavirus A NOT DETECTED NOT DETECTED Final   Sapovirus (I, II, IV, and V) NOT DETECTED NOT DETECTED Final    Comment: Performed at [facility], [address], [place], [place] [address]  Urine culture     Status: None   Collection Time: 11/03/20  9:39 PM   Specimen: In/Out Cath Urine  Result Value Ref Range Status   Specimen  Description   Final    IN/OUT CATH URINE Performed at [facility], [address], [place], [place] [address]    Special Requests   Final    NONE Performed at [facility], [address], [place], [place] [address]    Culture   Final    NO GROWTH Performed at [facility], [place] [address], [place], [place] [address]    Report Status 11/04/2020 FINAL  Final  C Difficile Quick Screen w PCR reflex     Status: None   Collection Time: 11/03/20  9:39 PM   Specimen: STOOL  Result Value Ref Range Status   C Diff antigen NEGATIVE NEGATIVE Final   C Diff toxin NEGATIVE  NEGATIVE Final   C Diff interpretation No C. difficile detected.  Final    Comment: Performed at [facility], [address] [place], [place] [address]  CULTURE, BLOOD (ROUTINE X 2) w Reflex to ID Panel     Status: None (Preliminary result)   Collection Time: 11/05/20 12:34 AM   Specimen: BLOOD  Result Value Ref Range Status   Specimen Description BLOOD RIGHT ANTECUBITAL  Final   Special Requests   Final    BOTTLES DRAWN AEROBIC AND ANAEROBIC Blood Culture adequate volume   Culture   Final    NO GROWTH < 12 HOURS Performed at [facility], [address], [place], [place] [address]    Report Status PENDING  Incomplete  CULTURE, BLOOD (ROUTINE X 2) w Reflex to ID Panel     Status: None (Preliminary result)   Collection Time: 11/05/20 12:34 AM   Specimen: BLOOD  Result Value Ref Range Status   Specimen Description BLOOD BLOOD RIGHT HAND  Final   Special Requests   Final    BOTTLES DRAWN AEROBIC AND ANAEROBIC Blood Culture adequate volume   Culture   Final    NO GROWTH < 12 HOURS Performed at [facility], [address], [place], [place] [address]    Report Status PENDING  Incomplete      Radiology Studies: US Abdomen Complete  Result Date: 11/03/2020 CLINICAL DATA:  RIGHT upper quadrant abdominal pain; history end-stage renal disease on dialysis, smoker, COPD, CHF at EXAM: ABDOMEN ULTRASOUND COMPLETE COMPARISON:  11/12/2019 FINDINGS: Gallbladder: Mildly thickened gallbladder wall, a nonspecific finding in the setting of ascites. Small amount of dependent sludge within gallbladder. No shadowing calculi or sonographic Murphy sign. Common bile duct: Diameter: 3 mm, normal Liver: Normal echogenicity without mass. Hepatic margins appear minimally nodular raising question of cirrhosis. Portal vein is patent on color Doppler imaging with normal direction of blood flow towards the liver. IVC: Normal appearance Pancreas: Normal appearance. Pancreatic duct upper  normal caliber 2.7 mm. Spleen: Normal appearance, 11.6 cm length. Few collaterals at splenic hilum. Right Kidney: Length: 9.5 cm. Cortical thinning and atrophy. Increased cortical echogenicity. Small cyst at upper pole 14 x 12 x 14 mm. No additional masses or hydronephrosis. Left Kidney: Length: 9.0 cm. Cortical thinning. Increased cortical echogenicity. Tiny cyst at mid upper kidney 6 x 6 x 6 mm. No additional mass or hydronephrosis Abdominal aorta: Normal caliber Other findings: Minimal scattered ascites. IMPRESSION: Cortical thinning and medical renal disease changes of both kidneys with tiny renal cysts. Minimal sludge within gallbladder. Minimally nodular hepatic margins raising question of cirrhosis. Minimal scattered ascites Electronically Signed   By: Lavonia Dana M.D.   On: 11/03/2020 10:58   PERIPHERAL VASCULAR CATHETERIZATION  Result Date: 11/03/2020  See op note  US Paracentesis  Result Date: 11/03/2020 INDICATION: History of end-stage renal disease, on dialysis with recurrent symptomatic ascites. Please perform ultrasound-guided paracentesis for therapeutic purposes. EXAM: ULTRASOUND-GUIDED PARACENTESIS COMPARISON:  Multiple previous ultrasound-guided paracenteses, most recently 09/20/2020 yielding 4.9 L of peritoneal fluid. MEDICATIONS: None. COMPLICATIONS: None immediate. TECHNIQUE: Informed written consent was obtained from the patient after a discussion of the risks, benefits and alternatives to treatment. A timeout was performed prior to the initiation of the procedure. Initial ultrasound scanning demonstrates a small amount of ascites within the left lower abdomen which was subsequently prepped and draped in the usual sterile fashion. 1% lidocaine with epinephrine was used for local anesthesia. Under direct ultrasound guidance, a 19 gauge, 7-cm, Yueh catheter was introduced. An ultrasound image was saved for documentation purposed. The paracentesis was performed. The catheter was removed and  a dressing was applied. The patient tolerated the procedure well without immediate post procedural complication. FINDINGS: A total of approximately 3.2 liters of serous fluid was removed. IMPRESSION: Successful ultrasound-guided paracentesis yielding 3.2 liters of peritoneal fluid. Electronically Signed   By: Sandi Mariscal M.D.   On: 11/03/2020 11:02      Scheduled Meds: . amLODipine  10 mg Oral Daily  . calcium acetate  1,334 mg Oral TID WC  . Chlorhexidine Gluconate Cloth  6 each Topical Daily  . DULoxetine  60 mg Oral BID  . heparin  5,000 Units Subcutaneous Q8H  . metoprolol tartrate  50 mg Oral BID  . mometasone-formoterol  2 puff Inhalation BID  . multivitamin  1 tablet Oral Daily  . nicotine  21 mg Transdermal Daily  . pantoprazole  40 mg Oral Daily  . sodium chloride flush  3 mL Intravenous Q12H  . torsemide  100 mg Oral Daily  . [START ON 11/06/2020] Vitamin D (Ergocalciferol)  50,000 Units Oral Q Sun   Continuous Infusions: . sodium chloride    . albumin human    . ampicillin (OMNIPEN) IV 2 g (11/05/20 1001)     LOS: 2 days      Time spent: 25 minutes   Dessa Phi, DO [facility] 11/05/2020, 10:09 AM   Available via Epic secure chat 7am-7pm After these hours, please refer to coverage provider listed on [email or web address]

## 2020-11-05 NOTE — Progress Notes (Signed)
Cross Cover Informed by nursing staff patient became beligerant with sttaff after she was asked to wear a mask walking around in halls. She then informed staff she was leaving. I was just informed patient signed out AMA

## 2020-11-05 NOTE — Progress Notes (Signed)
[facility]  ROUNDING NOTE   Subjective:    Patient is doing fair.  States she is able to eat, no nausea or vomiting.  Her primary concern today is pain over her graft on the left arm. She reports this is tender to touch No shortness of breath or leg edema   Objective:  Vital signs in last 24 hours:  Temp:  [98 F (36.7 C)-98.7 F (37.1 C)] 98.4 F (36.9 C) (11/06 0824) Pulse Rate:  [86-99] 87 (11/06 0824) Resp:  [16-18] 16 (11/06 0824) BP: (123-142)/(67-86) 130/80 (11/06 0824) SpO2:  [94 %-100 %] 100 % (11/06 0824)  Weight change:  Filed Weights   11/02/20 1224 11/03/20 1544  Weight: 82.2 kg 77.1 kg    Intake/Output: I/O last 3 completed shifts: In: 223 [P.O.:120; I.V.:3; IV Piggyback:100] Out: 1941 [[id number]]   Intake/Output this shift:  No intake/output data recorded.  Physical Exam: General: In no acute distress  Head: Moist oral mucosal membranes  Eyes: Sclerae and conjunctivae clear  Lungs:  Respirations even,unlabored, lungs clear  Heart: S1S2, no rubs or gallops  Abdomen:  Soft, nontender , non distended  Extremities:  Trace peripheral edema.  Neurologic: Awake, alert, oriented  Skin: No acute lesions or rashes  Access: LUA AVG +thrill,+bruit    Basic Metabolic Panel: Recent Labs  Lab 11/02/20 1230 11/02/20 1230 11/03/20 0349 11/04/20 0422 11/05/20 0342  NA 131*  --  134* 128* 132*  K 5.3*  --  4.2 4.5 3.8  CL 90*  --  94* 91* 97*  CO2 21*  --  26 23 29   GLUCOSE 98  --  156* 103* 109*  BUN 54*  --  34* 55* 32*  CREATININE 7.31*  --  5.21* 7.42* 5.08*  CALCIUM 9.2   < > 8.7* 8.5* 8.3*  MG 1.6*  --   --   --   --    < > = values in this interval not displayed.    Liver Function Tests: Recent Labs  Lab 11/02/20 1236 11/03/20 0349 11/04/20 0422 11/05/20 0342  AST 74* 133* 92* 50*  ALT 50* 86* 97* 78*  ALKPHOS 146* 129* 144* 148*  BILITOT 1.3* 1.0 0.8 0.8  PROT 8.1 7.0 6.8 6.9  ALBUMIN 3.4* 3.0* 2.7* 2.7*   No  results for input(s): LIPASE, AMYLASE in the last 168 hours. No results for input(s): AMMONIA in the last 168 hours.  CBC: Recent Labs  Lab 11/02/20 1230 11/03/20 0349 11/04/20 0422 11/05/20 0342  WBC 11.6* 13.4* 9.6 8.2  HGB 9.6* 8.6* 8.0* 8.2*  HCT 30.4* 26.6* 25.8* 26.0*  MCV 83.7 81.6 83.2 82.8  PLT 175 140* 124* 137*    Cardiac Enzymes: No results for input(s): CKTOTAL, CKMB, CKMBINDEX, TROPONINI in the last 168 hours.  BNP: Invalid input(s): POCBNP  CBG: Recent Labs  Lab 11/02/20 1555 11/02/20 1631  GLUCAP 60* 80    Microbiology: Results for orders placed or performed during the hospital encounter of 11/02/20  Respiratory Panel by RT PCR (Flu A&B, Covid) - Nasopharyngeal Swab     Status: None   Collection Time: 11/02/20  3:32 PM   Specimen: Nasopharyngeal Swab  Result Value Ref Range Status   SARS Coronavirus 2 by RT PCR NEGATIVE NEGATIVE Final    Comment: (NOTE) SARS-CoV-2 target nucleic acids are NOT DETECTED.  The SARS-CoV-2 RNA is generally detectable in upper respiratoy specimens during the acute phase of infection. The lowest concentration of SARS-CoV-2 viral copies this assay can  detect is 131 copies/mL. A negative result does not preclude SARS-Cov-2 infection and should not be used as the sole basis for treatment or other patient management decisions. A negative result may occur with  improper specimen collection/handling, submission of specimen other than nasopharyngeal swab, presence of viral mutation(s) within the areas targeted by this assay, and inadequate number of viral copies (<131 copies/mL). A negative result must be combined with clinical observations, patient history, and epidemiological information. The expected result is Negative.  Fact Sheet for Patients:  PinkCheek.be  Fact Sheet for Healthcare Providers:  GravelBags.it  This test is no t yet approved or cleared by the  [place] FDA and  has been authorized for detection and/or diagnosis of SARS-CoV-2 by FDA under an Emergency Use Authorization (EUA). This EUA will remain  in effect (meaning this test can be used) for the duration of the COVID-19 declaration under Section 564(b)(1) of the Act, 21 U.S.C. section 360bbb-3(b)(1), unless the authorization is terminated or revoked sooner.     Influenza A by PCR NEGATIVE NEGATIVE Final   Influenza B by PCR NEGATIVE NEGATIVE Final    Comment: (NOTE) The Xpert Xpress SARS-CoV-2/FLU/RSV assay is intended as an aid in  the diagnosis of influenza from Nasopharyngeal swab specimens and  should not be used as a sole basis for treatment. Nasal washings and  aspirates are unacceptable for Xpert Xpress SARS-CoV-2/FLU/RSV  testing.  Fact Sheet for Patients: PinkCheek.be  Fact Sheet for Healthcare Providers: GravelBags.it  This test is not yet approved or cleared by the [place] FDA and  has been authorized for detection and/or diagnosis of SARS-CoV-2 by  FDA under an Emergency Use Authorization (EUA). This EUA will remain  in effect (meaning this test can be used) for the duration of the  Covid-19 declaration under Section 564(b)(1) of the Act, 21  U.S.C. section 360bbb-3(b)(1), unless the authorization is  terminated or revoked. Performed at [facility], [address], [place], [place] [address]   Blood Culture (routine x 2)     Status: Abnormal   Collection Time: 11/02/20  4:44 PM   Specimen: BLOOD  Result Value Ref Range Status   Specimen Description   Final    BLOOD RIGHT ANTECUBITAL Performed at [facility], [address] [place], [place] [address]    Special Requests   Final    BOTTLES DRAWN AEROBIC AND ANAEROBIC Blood Culture results may not be optimal due to an inadequate volume of blood received in culture bottles Performed at [facility], [address] [place], [place] [address]    Culture  Setup Time   Final    GRAM POSITIVE COCCI IN BOTH AEROBIC AND ANAEROBIC BOTTLES CRITICAL RESULT CALLED TO, READ BACK BY AND VERIFIED WITHVioleta Gelinas AT 6222 11/03/20 SDR Performed at [facility], [place] [address] [place],  [address]    Culture ENTEROCOCCUS FAECALIS (A)  Final   Report Status 11/05/2020 FINAL  Final   Organism ID, Bacteria ENTEROCOCCUS FAECALIS  Final      Susceptibility   Enterococcus faecalis - MIC*    AMPICILLIN <=2 SENSITIVE Sensitive     VANCOMYCIN 1 SENSITIVE Sensitive     GENTAMICIN SYNERGY SENSITIVE Sensitive     * ENTEROCOCCUS FAECALIS  Blood Culture ID Panel (Reflexed)     Status: Abnormal   Collection Time: 11/02/20  4:44 PM  Result Value Ref Range Status   Enterococcus faecalis DETECTED (A) NOT DETECTED Final  Comment: CRITICAL RESULT CALLED TO, READ BACK BY AND VERIFIED WITH: JASON ROBBONS AT 3818 11/03/20 SDR    Enterococcus Faecium NOT DETECTED NOT DETECTED Final   Listeria monocytogenes NOT DETECTED NOT DETECTED Final   Staphylococcus species NOT DETECTED NOT DETECTED Final   Staphylococcus aureus (BCID) NOT DETECTED NOT DETECTED Final   Staphylococcus epidermidis NOT DETECTED NOT DETECTED Final   Staphylococcus lugdunensis NOT DETECTED NOT DETECTED Final   Streptococcus species NOT DETECTED NOT DETECTED Final   Streptococcus agalactiae NOT DETECTED NOT DETECTED Final   Streptococcus pneumoniae NOT DETECTED NOT DETECTED Final   Streptococcus pyogenes NOT DETECTED NOT DETECTED Final   A.calcoaceticus-baumannii NOT DETECTED NOT DETECTED Final   Bacteroides fragilis NOT DETECTED NOT DETECTED Final   Enterobacterales NOT DETECTED NOT DETECTED Final   Enterobacter cloacae complex NOT DETECTED NOT DETECTED Final   Escherichia coli NOT DETECTED NOT DETECTED Final   Klebsiella aerogenes NOT DETECTED NOT DETECTED Final   Klebsiella oxytoca NOT DETECTED NOT DETECTED Final   Klebsiella  pneumoniae NOT DETECTED NOT DETECTED Final   Proteus species NOT DETECTED NOT DETECTED Final   Salmonella species NOT DETECTED NOT DETECTED Final   Serratia marcescens NOT DETECTED NOT DETECTED Final   Haemophilus influenzae NOT DETECTED NOT DETECTED Final   Neisseria meningitidis NOT DETECTED NOT DETECTED Final   Pseudomonas aeruginosa NOT DETECTED NOT DETECTED Final   Stenotrophomonas maltophilia NOT DETECTED NOT DETECTED Final   Candida albicans NOT DETECTED NOT DETECTED Final   Candida auris NOT DETECTED NOT DETECTED Final   Candida glabrata NOT DETECTED NOT DETECTED Final   Candida krusei NOT DETECTED NOT DETECTED Final   Candida parapsilosis NOT DETECTED NOT DETECTED Final   Candida tropicalis NOT DETECTED NOT DETECTED Final   Cryptococcus neoformans/gattii NOT DETECTED NOT DETECTED Final   Vancomycin resistance NOT DETECTED NOT DETECTED Final    Comment: Performed at [facility], [address] [place], [place] [address]  Blood Culture (routine x 2)     Status: Abnormal   Collection Time: 11/02/20  4:49 PM   Specimen: BLOOD  Result Value Ref Range Status   Specimen Description   Final    BLOOD BLOOD RIGHT WRIST Performed at [facility], [address] [place], [place] [address]    Special Requests   Final    BOTTLES DRAWN AEROBIC AND ANAEROBIC Blood Culture results may not be optimal due to an excessive volume of blood received in culture bottles Performed at [facility], [address] [place], [place] [address]    Culture  Setup Time   Final    GRAM POSITIVE COCCI IN BOTH AEROBIC AND ANAEROBIC BOTTLES CRITICAL VALUE NOTED.  VALUE IS CONSISTENT WITH PREVIOUSLY REPORTED AND CALLED VALUE. Performed at [facility], [address] [place], [place] [address]    Culture (A)  Final    ENTEROCOCCUS FAECALIS SUSCEPTIBILITIES PERFORMED ON PREVIOUS CULTURE WITHIN THE LAST 5 DAYS. Performed at [facility], [place] [address],  [place], [place] [address]    Report Status 11/05/2020 FINAL  Final  Body fluid culture     Status: None (Preliminary result)   Collection Time: 11/03/20  9:35 AM   Specimen: Peritoneal Dialysate  Result Value Ref Range Status   Specimen Description   Final    PERITONEAL DIALYSATE Performed at [facility], [address], [place], [place] [address]    Special Requests   Final    NONE Performed at [facility], [address] [place], [place] [address]    Gram Stain   Final    RARE WBC PRESENT,BOTH PMN AND MONONUCLEAR NO ORGANISMS SEEN    Culture   Final    NO GROWTH < 24 HOURS Performed at [facility], [place] [address], [place], [place] [address]    Report Status PENDING  Incomplete  Gastrointestinal Panel by PCR , Stool     Status: Abnormal   Collection Time: 11/03/20  9:38 PM   Specimen: STOOL  Result Value Ref Range Status   Campylobacter species NOT DETECTED NOT DETECTED Final   Plesimonas shigelloides DETECTED (A) NOT DETECTED Final    Comment: RESULT CALLED TO, READ BACK BY AND VERIFIED WITH: SKYLAR HARDIN RN 2323 11/03/20 HNM    Salmonella species NOT DETECTED NOT DETECTED Final   Yersinia enterocolitica NOT DETECTED NOT DETECTED Final   Vibrio species NOT DETECTED NOT DETECTED Final   Vibrio cholerae NOT DETECTED NOT DETECTED Final   Enteroaggregative E coli (EAEC) NOT DETECTED NOT DETECTED Final   Enteropathogenic E coli (EPEC) NOT DETECTED NOT DETECTED Final   Enterotoxigenic E coli (ETEC) NOT DETECTED NOT DETECTED Final   Shiga like toxin producing E coli (STEC) NOT DETECTED NOT DETECTED Final   Shigella/Enteroinvasive E coli (EIEC) NOT DETECTED NOT DETECTED Final   Cryptosporidium NOT DETECTED NOT DETECTED Final   Cyclospora cayetanensis NOT DETECTED NOT DETECTED Final   Entamoeba histolytica NOT DETECTED NOT DETECTED Final   Giardia lamblia NOT DETECTED NOT DETECTED Final   Adenovirus F40/41 NOT DETECTED NOT DETECTED Final   Astrovirus  NOT DETECTED NOT DETECTED Final   Norovirus GI/GII NOT DETECTED NOT DETECTED Final   Rotavirus A NOT DETECTED NOT DETECTED Final   Sapovirus (I, II, IV, and V) NOT DETECTED NOT DETECTED Final    Comment: Performed at [facility], [address], [place], [place] [address]  Urine culture     Status: None   Collection Time: 11/03/20  9:39 PM   Specimen: In/Out Cath Urine  Result Value Ref Range Status   Specimen Description   Final    IN/OUT CATH URINE Performed at [facility], [address] [place], [place] [address]    Special Requests   Final    NONE Performed at [facility], [address], [place], [place] [address]    Culture   Final    NO GROWTH Performed at [facility], [address], [place], [place] [address]    Report Status 11/04/2020 FINAL  Final  C Difficile Quick Screen w PCR reflex     Status: None   Collection Time: 11/03/20  9:39 PM   Specimen: STOOL  Result Value Ref Range Status   C Diff antigen NEGATIVE NEGATIVE Final   C Diff toxin NEGATIVE NEGATIVE Final   C Diff interpretation No C. difficile detected.  Final    Comment: Performed at [facility], [address] [place],  [address]  CULTURE, BLOOD (ROUTINE X 2) w Reflex to ID Panel     Status: None (Preliminary result)   Collection Time: 11/05/20 12:34 AM   Specimen: BLOOD  Result Value Ref Range Status   Specimen Description BLOOD RIGHT ANTECUBITAL  Final   Special Requests   Final    BOTTLES DRAWN AEROBIC AND ANAEROBIC Blood Culture adequate volume   Culture   Final    NO GROWTH < 12 HOURS Performed at [facility], [place], [place]  [address]    Report Status PENDING  Incomplete  CULTURE, BLOOD (ROUTINE X 2) w Reflex to ID Panel     Status: None (Preliminary result)   Collection Time: 11/05/20 12:34 AM   Specimen: BLOOD  Result Value Ref Range Status   Specimen Description BLOOD BLOOD RIGHT HAND  Final    Special Requests   Final    BOTTLES DRAWN AEROBIC AND ANAEROBIC Blood Culture adequate volume   Culture   Final    NO GROWTH < 12 HOURS Performed at [facility], [address] [place], [place] [address]    Report Status PENDING  Incomplete    Coagulation Studies: Recent Labs    11/02/20 1644 11/03/20 0349  LABPROT 17.2* 16.4*  INR 1.5* 1.4*    Urinalysis: Recent Labs    11/03/20 2139  COLORURINE AMBER*  LABSPEC 1.015  PHURINE 6.0  GLUCOSEU NEGATIVE  HGBUR SMALL*  BILIRUBINUR NEGATIVE  KETONESUR NEGATIVE  PROTEINUR >=300*  NITRITE NEGATIVE  LEUKOCYTESUR SMALL*      Imaging: US Abdomen Complete  Result Date: 11/03/2020 CLINICAL DATA:  RIGHT upper quadrant abdominal pain; history end-stage renal disease on dialysis, smoker, COPD, CHF at EXAM: ABDOMEN ULTRASOUND COMPLETE COMPARISON:  11/12/2019 FINDINGS: Gallbladder: Mildly thickened gallbladder wall, a nonspecific finding in the setting of ascites. Small amount of dependent sludge within gallbladder. No shadowing calculi or sonographic Murphy sign. Common bile duct: Diameter: 3 mm, normal Liver: Normal echogenicity without mass. Hepatic margins appear minimally nodular raising question of cirrhosis. Portal vein is patent on color Doppler imaging with normal direction of blood flow towards the liver. IVC: Normal appearance Pancreas: Normal appearance. Pancreatic duct upper normal caliber 2.7 mm. Spleen: Normal appearance, 11.6 cm length. Few collaterals at splenic hilum. Right Kidney: Length: 9.5 cm. Cortical thinning and atrophy. Increased cortical echogenicity. Small cyst at upper pole 14 x 12 x 14 mm. No additional masses or hydronephrosis. Left Kidney: Length: 9.0 cm. Cortical thinning. Increased cortical echogenicity. Tiny cyst at mid upper kidney 6 x 6 x 6 mm. No additional mass or hydronephrosis Abdominal aorta: Normal caliber Other findings: Minimal scattered ascites. IMPRESSION: Cortical thinning and medical  renal disease changes of both kidneys with tiny renal cysts. Minimal sludge within gallbladder. Minimally nodular hepatic margins raising question of cirrhosis. Minimal scattered ascites Electronically Signed   By: Lavonia Dana M.D.   On: 11/03/2020 10:58   PERIPHERAL VASCULAR CATHETERIZATION  Result Date: 11/03/2020 See op note  US Paracentesis  Result Date: 11/03/2020 INDICATION: History of end-stage renal disease, on dialysis with recurrent symptomatic ascites. Please perform ultrasound-guided paracentesis for therapeutic purposes. EXAM: ULTRASOUND-GUIDED PARACENTESIS COMPARISON:  Multiple previous ultrasound-guided paracenteses, most recently 09/20/2020 yielding 4.9 L of peritoneal fluid. MEDICATIONS: None. COMPLICATIONS: None immediate. TECHNIQUE: Informed written consent was obtained from the patient after a discussion of the risks, benefits and alternatives to treatment. A timeout was performed prior to the initiation of the procedure. Initial ultrasound scanning demonstrates a small amount of ascites within the left lower abdomen which was subsequently prepped and draped in the usual sterile fashion. 1% lidocaine with epinephrine was used for local anesthesia. Under direct ultrasound guidance, a 19 gauge, 7-cm, Yueh catheter was introduced. An ultrasound image was saved for documentation purposed. The paracentesis was performed. The catheter was removed and a dressing was applied. The patient tolerated the procedure well without immediate post procedural complication. FINDINGS: A total of approximately 3.2 liters of serous fluid was removed. IMPRESSION: Successful ultrasound-guided paracentesis yielding 3.2 liters of peritoneal  fluid. Electronically Signed   By: Sandi Mariscal M.D.   On: 11/03/2020 11:02     Medications:   . sodium chloride    . albumin human    . ampicillin (OMNIPEN) IV 2 g (11/04/20 2225)   . amLODipine  10 mg Oral Daily  . calcium acetate  1,334 mg Oral TID WC  .  Chlorhexidine Gluconate Cloth  6 each Topical Daily  . DULoxetine  60 mg Oral BID  . heparin  5,000 Units Subcutaneous Q8H  . metoprolol tartrate  50 mg Oral BID  . mometasone-formoterol  2 puff Inhalation BID  . multivitamin  1 tablet Oral Daily  . nicotine  21 mg Transdermal Daily  . pantoprazole  40 mg Oral Daily  . sodium chloride flush  3 mL Intravenous Q12H  . torsemide  100 mg Oral Daily  . [START ON 11/06/2020] Vitamin D (Ergocalciferol)  50,000 Units Oral Q Sun   sodium chloride, acetaminophen, albumin human, albuterol, albuterol, benzonatate, docusate sodium, hydrOXYzine, oxyCODONE, QUEtiapine, sodium chloride flush  Assessment/ Plan:  Janet Olson is a 51 y.o.  female known to our practice, has past medical history of diastolic heart failure,anemia, COPD,hypertension and ESRD on hemodialysis MWF. She presented to the ED with c/o chills and SOB x 1 day.She went to her dialysis center today,3 minutes into the treatment, she reported worsening chills and was sent to ED.  #ESRD on dialysis MWF Will continue MWF schedule via left arm AV graft Right IJ PermCath was removed on November 4 Pain over AV graft.  Will order ultrasound Local pain control with lidocaine cream   # Secondary Hyperparathyroidism Lab Results  Component Value Date   PTH 73 (H) 01/03/2019   PTH Comment 01/03/2019   CALCIUM 8.3 (L) 11/05/2020   PHOS 6.4 (H) 03/03/2020  Continue calcium acetate with meals  #Anemia of CKD Lab Results  Component Value Date   HGB 8.2 (L) 11/05/2020    Will continue Epogen with dialysis MWF  # Sepsis secondary to enterococcal bacteremia Right IJ tunneled catheter removed November 4 Currently on ampicillin IV    LOS: 2 Rachard Isidro 11/6/20218:41 AM

## 2020-11-05 NOTE — Consult Note (Signed)
    [facility] has been requested to perform a transesophageal echocardiogram on Janet Olson for TEE for enterococcus bacteremia. Await 2D surface echo, if this is unrevealing, we will proceed with TEE on 11/8.  After careful review of history and examination, the risks and benefits of transesophageal echocardiogram have been explained including risks of esophageal damage, perforation (1:10,000 risk), bleeding, pharyngeal hematoma as well as other potential complications associated with conscious sedation including aspiration, arrhythmia, respiratory failure and death. Alternatives to treatment were discussed, questions were answered. Patient is willing to proceed.   Christell Faith, PA-C 11/05/2020 8:50 AM

## 2020-11-05 NOTE — Progress Notes (Addendum)
    Consulted for possible TEE. Await 2D surface echo, if unrevealing, we can discuss possible TEE with patient early next week. Will make NPO at midnight on 11/8 in case.

## 2020-11-05 NOTE — Progress Notes (Signed)
ID Pt c/o pain at the fistula site Feels that it is swollen Patient Vitals for the past 24 hrs:  BP Temp Temp src Pulse Resp SpO2  11/05/20 1511 130/71 97.7 F (36.5 C) Oral 79 16 99 %  11/05/20 0824 130/80 98.4 F (36.9 C) Oral 87 16 100 %  11/05/20 0427 132/80 98.2 F (36.8 C) Oral 86 18 94 %  11/04/20 2041 (!) 142/79 98.1 F (36.7 C) Oral 99 17 98 %    O/e awake and alert Left fistula area looks okay Some tenderness next to the scar Bruit good Do not appreciate any obvious swelling Abd soft Hss1s2 CNS non focal Labs CBC Latest Ref Rng & Units 11/05/2020 11/04/2020 11/03/2020  WBC 4.0 - 10.5 K/uL 8.2 9.6 13.4(H)  Hemoglobin 12.0 - 15.0 g/dL 8.2(L) 8.0(L) 8.6(L)  Hematocrit 36 - 46 % 26.0(L) 25.8(L) 26.6(L)  Platelets 150 - 400 K/uL 137(L) 124(L) 140(L)    CMP Latest Ref Rng & Units 11/05/2020 11/04/2020 11/03/2020  Glucose 70 - 99 mg/dL 109(H) 103(H) 156(H)  BUN 6 - 20 mg/dL 32(H) 55(H) 34(H)  Creatinine 0.44 - 1.00 mg/dL 5.08(H) 7.42(H) 5.21(H)  Sodium 135 - 145 mmol/L 132(L) 128(L) 134(L)  Potassium 3.5 - 5.1 mmol/L 3.8 4.5 4.2  Chloride 98 - 111 mmol/L 97(L) 91(L) 94(L)  CO2 22 - 32 mmol/L 29 23 26   Calcium 8.9 - 10.3 mg/dL 8.3(L) 8.5(L) 8.7(L)  Total Protein 6.5 - 8.1 g/dL 6.9 6.8 7.0  Total Bilirubin 0.3 - 1.2 mg/dL 0.8 0.8 1.0  Alkaline Phos 38 - 126 U/L 148(H) 144(H) 129(H)  AST 15 - 41 U/L 50(H) 92(H) 133(H)  ALT 0 - 44 U/L 78(H) 97(H) 86(H)    Impression/recommendation Enterococcus bacteremia In a patient on dialysis through catheter. Very likely the catheter is the source. Catheter has been removed. Cath tip culture positive for enterococcus    2D echo ok. TEE   required Repeat blood cultures for clearance of bacteremia. sent today continue ampicillin  Hypertension. Was out of meds for 2 days. Currently on amlodipine and metoprolol and torsemide.   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  Discussed the management with the patient

## 2020-11-06 LAB — METHYLMALONIC ACID, SERUM: Methylmalonic Acid, Quantitative: 804 nmol/L — ABNORMAL HIGH (ref 0–378)

## 2020-11-06 LAB — BODY FLUID CULTURE: Culture: NO GROWTH

## 2020-11-06 LAB — CATH TIP CULTURE

## 2020-11-07 DIAGNOSIS — F419 Anxiety disorder, unspecified: Secondary | ICD-10-CM

## 2020-11-07 DIAGNOSIS — H938X2 Other specified disorders of left ear: Secondary | ICD-10-CM | POA: Insufficient documentation

## 2020-11-07 DIAGNOSIS — R634 Abnormal weight loss: Secondary | ICD-10-CM | POA: Insufficient documentation

## 2020-11-07 DIAGNOSIS — R197 Diarrhea, unspecified: Secondary | ICD-10-CM

## 2020-11-07 DIAGNOSIS — T827XXA Infection and inflammatory reaction due to other cardiac and vascular devices, implants and grafts, initial encounter: Secondary | ICD-10-CM | POA: Insufficient documentation

## 2020-11-07 DIAGNOSIS — I10 Essential (primary) hypertension: Secondary | ICD-10-CM

## 2020-11-07 DIAGNOSIS — R1013 Epigastric pain: Secondary | ICD-10-CM | POA: Insufficient documentation

## 2020-11-07 DIAGNOSIS — K219 Gastro-esophageal reflux disease without esophagitis: Secondary | ICD-10-CM

## 2020-11-07 DIAGNOSIS — R188 Other ascites: Secondary | ICD-10-CM | POA: Insufficient documentation

## 2020-11-07 DIAGNOSIS — F32A Depression, unspecified: Secondary | ICD-10-CM

## 2020-11-07 LAB — HEPATITIS B SURFACE ANTIBODY, QUANTITATIVE: Hep B S AB Quant (Post): 32.6 m[IU]/mL (ref >9.9)

## 2020-11-07 SURGERY — ECHOCARDIOGRAM, TRANSESOPHAGEAL
Anesthesia: Moderate Sedation

## 2020-11-07 NOTE — Discharge Summary (Addendum)
Physician Discharge Summary  Janet Olson DOB: 1970/06/27 DOA: 11/02/2020  PCP: Remi Haggard, FNP  Admit date: 11/02/2020 Disposition:  SIGNED OUT AMA  11/6   Brief/Interim Summary: Janet Sheer Baldwinis a 50 y.o.femalewith medical history significant forhypertension, chronic daily tobacco use, end-stage renal disease on hemodialysis, anxiety/depression, chronic daily diarrhea, presents to the emergency department via EMS for nonspecific concerns including numbness in extremities and pain, chills. She reports the symptoms started during dialysis.  Patient underwent paracentesis with 3.2 L fluid removal.  Blood culture returned positive for Enterococcus bacteremia.  She was started on IV ampicillin.  Infectious disease consulted.  Tunneled hemodialysis catheter removed on 11/4.  She underwent echocardiogram which was negative for endocarditis.  TEE was tentatively scheduled for 11/8.  Discharge Diagnoses:  Principal Problem:   Sepsis ([place]) Active Problems:   Benign hypertensive renal disease   Anemia in chronic kidney disease   COPD (chronic obstructive pulmonary disease) (HCC)   ESRD on dialysis (HCC)   Hyperkalemia   Abdominal ascites   Tobacco use   Right upper quadrant abdominal pain   Mass of ear canal, left   Enterococcus faecalis infection   Diarrhea   Benign essential HTN   Anxiety   Depression   GERD (gastroesophageal reflux disease)   Discharge Instructions   Allergies as of 11/05/2020   No Known Allergies     Medication List    ASK your doctor about these medications   albuterol (2.5 MG/3ML) 0.083% nebulizer solution Commonly known as: PROVENTIL Take 2.5 mg by nebulization every 6 (six) hours as needed for wheezing or shortness of breath.   albuterol 108 (90 Base) MCG/ACT inhaler Commonly known as: VENTOLIN HFA Inhale 2 puffs into the lungs every 6 (six) hours as needed for wheezing or shortness of breath.   amLODipine 10 MG  tablet Commonly known as: NORVASC Take 1 tablet (10 mg total) by mouth daily.   benzonatate 100 MG capsule Commonly known as: TESSALON Take 100 mg by mouth 2 (two) times daily as needed for cough.   calcium acetate 667 MG capsule Commonly known as: PHOSLO Take 2 capsules (1,334 mg total) by mouth 3 (three) times daily with meals.   chlorpheniramine-HYDROcodone 10-8 MG/5ML Suer Commonly known as: TUSSIONEX Take 5 mLs by mouth daily as needed for cough.   docusate sodium 100 MG capsule Commonly known as: COLACE Take 100 mg by mouth daily as needed for moderate constipation.   Dulera 200-5 MCG/ACT Aero Generic drug: mometasone-formoterol Inhale 2 puffs into the lungs 2 (two) times daily.   DULoxetine 60 MG capsule Commonly known as: CYMBALTA Take 60 mg by mouth 2 (two) times daily.   hydrOXYzine 25 MG tablet Commonly known as: ATARAX/VISTARIL Take 25 mg by mouth every 8 (eight) hours as needed for itching.   Melatonin 10 MG Caps Take 10 mg by mouth at bedtime as needed (sleep).   metoprolol tartrate 50 MG tablet Commonly known as: LOPRESSOR Take 50 mg by mouth 2 (two) times daily.   multivitamin Tabs tablet Take 1 tablet by mouth daily.   oxyCODONE 5 MG immediate release tablet Commonly known as: Oxy IR/ROXICODONE Take 1 tablet (5 mg total) by mouth every 6 (six) hours as needed for moderate pain or severe pain.   oxyCODONE-acetaminophen 5-325 MG tablet Commonly known as: Percocet Take 1-2 tablets by mouth every 6 (six) hours as needed for moderate pain or severe pain.   pantoprazole 40 MG tablet Commonly known as: PROTONIX Take 1  tablet (40 mg total) by mouth daily.   propranolol 40 MG tablet Commonly known as: INDERAL Take 40 mg by mouth daily.   QUEtiapine 25 MG tablet Commonly known as: SEROQUEL Take 25 mg by mouth at bedtime as needed (sleep).   torsemide 100 MG tablet Commonly known as: DEMADEX Take 1 tablet (100 mg total) by mouth daily.   Vitamin  D (Ergocalciferol) 1.25 MG (50000 UNIT) Caps capsule Commonly known as: DRISDOL Take 50,000 Units by mouth every Sunday.       No Known Allergies  Consultations:  Nephrology  Infectious disease  Cardiology for TEE   Procedures/Studies: DG Chest 1 View  Result Date: 11/02/2020 CLINICAL DATA:  Chills during dialysis EXAM: CHEST  1 VIEW COMPARISON:  03/03/2020 FINDINGS: Single frontal view of the chest demonstrates right internal jugular dialysis catheter tip overlying superior vena cava. Cardiac silhouette remains enlarged. There is central vascular congestion with mild diffuse interstitial prominence. No airspace disease, effusion, or pneumothorax. No acute bony abnormality. IMPRESSION: 1. Vascular congestion and mild interstitial edema. 2. Stable enlarged cardiac silhouette. Electronically Signed   By: Randa Ngo M.D.   On: 11/02/2020 15:49   US Abdomen Complete  Result Date: 11/03/2020 CLINICAL DATA:  RIGHT upper quadrant abdominal pain; history end-stage renal disease on dialysis, smoker, COPD, CHF at EXAM: ABDOMEN ULTRASOUND COMPLETE COMPARISON:  11/12/2019 FINDINGS: Gallbladder: Mildly thickened gallbladder wall, a nonspecific finding in the setting of ascites. Small amount of dependent sludge within gallbladder. No shadowing calculi or sonographic Murphy sign. Common bile duct: Diameter: 3 mm, normal Liver: Normal echogenicity without mass. Hepatic margins appear minimally nodular raising question of cirrhosis. Portal vein is patent on color Doppler imaging with normal direction of blood flow towards the liver. IVC: Normal appearance Pancreas: Normal appearance. Pancreatic duct upper normal caliber 2.7 mm. Spleen: Normal appearance, 11.6 cm length. Few collaterals at splenic hilum. Right Kidney: Length: 9.5 cm. Cortical thinning and atrophy. Increased cortical echogenicity. Small cyst at upper pole 14 x 12 x 14 mm. No additional masses or hydronephrosis. Left Kidney: Length: 9.0 cm.  Cortical thinning. Increased cortical echogenicity. Tiny cyst at mid upper kidney 6 x 6 x 6 mm. No additional mass or hydronephrosis Abdominal aorta: Normal caliber Other findings: Minimal scattered ascites. IMPRESSION: Cortical thinning and medical renal disease changes of both kidneys with tiny renal cysts. Minimal sludge within gallbladder. Minimally nodular hepatic margins raising question of cirrhosis. Minimal scattered ascites Electronically Signed   By: Lavonia Dana M.D.   On: 11/03/2020 10:58   [place] Dialysis Access  Result Date: 11/06/2020 CLINICAL DATA:  Left upper extremity pain at the hemodialysis access graft. Patient underwent fistula removal and AV graft creation 08/05/2020. EXAM: LEFT UPPER EXTREMITY HEMODIALYSIS ACCESS ULTRASOUND TECHNIQUE: ULTRASOUND PERFORMED OF THE LEFT UPPER ARM AV GRAFT WITH COLOR FLOW AND VELOCITY EVALUATION. COMPARISON:  NONE. FINDINGS: Left upper arm brachial axillary AV graft appears patent. Widely patent brachial arterial anastomosis at the elbow. Graft lumen appears patent without thrombus or occlusion. Venous anastomosis to the axillary vein (labeled as cephalic vein) is also patent. No perigraft fluid collection. No surrounding hematoma. Inflow artery: 434 centimeters/second Arterial anastomosis: 476 centimeters/second Graft Proximal: 770 centimeters/second Mid: 220 centimeters/second Distal: 383 centimeters/second In the mid humerus region there is a thrombosed vein in the superficial soft tissues just deep to the patent AV graft which likely is related to the previous left upper extremity native fistula. IMPRESSION: Patent left upper extremity brachial axillary AV graft circuit without thrombosis. No complicating  feature to account for upper arm pain. Electronically Signed   By: Jerilynn Mages.  Shick M.D.   On: 11/06/2020 14:13   PERIPHERAL VASCULAR CATHETERIZATION  Result Date: 11/03/2020 See op note  US Paracentesis  Result Date: 11/03/2020 INDICATION: History of  end-stage renal disease, on dialysis with recurrent symptomatic ascites. Please perform ultrasound-guided paracentesis for therapeutic purposes. EXAM: ULTRASOUND-GUIDED PARACENTESIS COMPARISON:  Multiple previous ultrasound-guided paracenteses, most recently 09/20/2020 yielding 4.9 L of peritoneal fluid. MEDICATIONS: None. COMPLICATIONS: None immediate. TECHNIQUE: Informed written consent was obtained from the patient after a discussion of the risks, benefits and alternatives to treatment. A timeout was performed prior to the initiation of the procedure. Initial ultrasound scanning demonstrates a small amount of ascites within the left lower abdomen which was subsequently prepped and draped in the usual sterile fashion. 1% lidocaine with epinephrine was used for local anesthesia. Under direct ultrasound guidance, a 19 gauge, 7-cm, Yueh catheter was introduced. An ultrasound image was saved for documentation purposed. The paracentesis was performed. The catheter was removed and a dressing was applied. The patient tolerated the procedure well without immediate post procedural complication. FINDINGS: A total of approximately 3.2 liters of serous fluid was removed. IMPRESSION: Successful ultrasound-guided paracentesis yielding 3.2 liters of peritoneal fluid. Electronically Signed   By: Sandi Mariscal M.D.   On: 11/03/2020 11:02   ECHOCARDIOGRAM COMPLETE  Result Date: 11/05/2020    ECHOCARDIOGRAM REPORT   Patient Name:   Janet Olson Date of Exam: 11/05/2020 Medical Rec #:  [id number]        Height:       66.0 in Accession #:    [id number]       Weight:       170.0 lb Date of Birth:  Jul 05, 1970        BSA:          1.866 m Patient Age:    66 years         BP:           130/80 mmHg Patient Gender: F                HR:           88 bpm. Exam Location:  [facility] Procedure: 2D Echo Indications:     Bacteremia 790.7 / R78.81  History:         Patient has prior history of Echocardiogram examinations, most                   recent 02/23/2020. COPD, Signs/Symptoms:Shortness of Breath;                  Risk Factors:Hypertension. ESRD ON DIALYSIS.  Sonographer:     Avanell Shackleton Referring Phys:  [place] Phys: Kate Sable MD IMPRESSIONS  1. Left ventricular ejection fraction, by estimation, is 55 to 60%. Left ventricular ejection fraction by PLAX is 60 %. The left ventricle has normal function. The left ventricle has no regional wall motion abnormalities. There is mild left ventricular hypertrophy. Left ventricular diastolic parameters are indeterminate.  2. Right ventricular systolic function is normal. The right ventricular size is mildly enlarged. There is severely elevated pulmonary artery systolic pressure.  3. Left atrial size was mild to moderately dilated.  4. Right atrial size was moderately dilated.  5. The mitral valve is normal in structure. Mild mitral valve regurgitation.  6. The aortic valve is normal in structure. Aortic valve regurgitation is not visualized.  7. The  inferior vena cava is dilated in size with <50% respiratory variability, suggesting right atrial pressure of 15 mmHg. Conclusion(s)/Recommendation(s): No evidence of valvular vegetations on this transthoracic echocardiogram. Would recommend a transesophageal echocardiogram to exclude infective endocarditis if clinically indicated. FINDINGS  Left Ventricle: Left ventricular ejection fraction, by estimation, is 55 to 60%. Left ventricular ejection fraction by PLAX is 60 %. The left ventricle has normal function. The left ventricle has no regional wall motion abnormalities. The left ventricular internal cavity size was normal in size. There is mild left ventricular hypertrophy. Left ventricular diastolic parameters are indeterminate. Right Ventricle: The right ventricular size is mildly enlarged. No increase in right ventricular wall thickness. Right ventricular systolic function is normal. There is severely elevated pulmonary artery  systolic pressure. The tricuspid regurgitant velocity is 3.56 m/s, and with an assumed right atrial pressure of 15 mmHg, the estimated right ventricular systolic pressure is 58.8 mmHg. Left Atrium: Left atrial size was mild to moderately dilated. Right Atrium: Right atrial size was moderately dilated. Pericardium: There is no evidence of pericardial effusion. Mitral Valve: The mitral valve is normal in structure. Mild mitral valve regurgitation. Tricuspid Valve: The tricuspid valve is normal in structure. Tricuspid valve regurgitation is trivial. Aortic Valve: The aortic valve is normal in structure. Aortic valve regurgitation is not visualized. Pulmonic Valve: The pulmonic valve was normal in structure. Pulmonic valve regurgitation is mild. Aorta: The aortic root is normal in size and structure. Venous: The inferior vena cava is dilated in size with less than 50% respiratory variability, suggesting right atrial pressure of 15 mmHg. IAS/Shunts: No atrial level shunt detected by color flow Doppler.  LEFT VENTRICLE PLAX 2D LV EF:         Left ventricular ejection fraction by PLAX is 60 %. LVIDd:         4.72 cm LVIDs:         3.21 cm LV PW:         1.14 cm LV IVS:        1.43 cm LVOT diam:     1.80 cm LVOT Area:     2.54 cm  RIGHT VENTRICLE             IVC RV S prime:     12.40 cm/s  IVC diam: 2.89 cm TAPSE (M-mode): 2.1 cm LEFT ATRIUM            Index       RIGHT ATRIUM           Index LA diam:      5.10 cm  2.73 cm/m  RA Area:     24.50 cm LA Vol (A2C): 82.4 ml  44.15 ml/m RA Volume:   76.00 ml  40.72 ml/m LA Vol (A4C): 111.0 ml 59.47 ml/m   AORTA Ao Root diam: 3.70 cm MR Peak grad: 125.4 mmHg  TRICUSPID VALVE MR Mean grad: 86.0 mmHg   TR Peak grad:   50.7 mmHg MR Vmax:      560.00 cm/s TR Vmax:        356.00 cm/s MR Vmean:     442.0 cm/s                           SHUNTS                           Systemic Diam: 1.80 cm Kate Sable MD Electronically signed by Aaron Edelman  Agbor-Etang MD Signature Date/Time:  11/05/2020/2:43:14 PM    Final         Discharge Exam: Not examined, patient left AMA    The results of significant diagnostics from this hospitalization (including imaging, microbiology, ancillary and laboratory) are listed below for reference.     Microbiology: Recent Results (from the past 240 hour(s))  Respiratory Panel by RT PCR (Flu A&B, Covid) - Nasopharyngeal Swab     Status: None   Collection Time: 11/02/20  3:32 PM   Specimen: Nasopharyngeal Swab  Result Value Ref Range Status   SARS Coronavirus 2 by RT PCR NEGATIVE NEGATIVE Final    Comment: (NOTE) SARS-CoV-2 target nucleic acids are NOT DETECTED.  The SARS-CoV-2 RNA is generally detectable in upper respiratoy specimens during the acute phase of infection. The lowest concentration of SARS-CoV-2 viral copies this assay can detect is 131 copies/mL. A negative result does not preclude SARS-Cov-2 infection and should not be used as the sole basis for treatment or other patient management decisions. A negative result may occur with  improper specimen collection/handling, submission of specimen other than nasopharyngeal swab, presence of viral mutation(s) within the areas targeted by this assay, and inadequate number of viral copies (<131 copies/mL). A negative result must be combined with clinical observations, patient history, and epidemiological information. The expected result is Negative.  Fact Sheet for Patients:  PinkCheek.be  Fact Sheet for Healthcare Providers:  GravelBags.it  This test is no t yet approved or cleared by the [place] FDA and  has been authorized for detection and/or diagnosis of SARS-CoV-2 by FDA under an Emergency Use Authorization (EUA). This EUA will remain  in effect (meaning this test can be used) for the duration of the COVID-19 declaration under Section 564(b)(1) of the Act, 21 U.S.C. section 360bbb-3(b)(1), unless the  authorization is terminated or revoked sooner.     Influenza A by PCR NEGATIVE NEGATIVE Final   Influenza B by PCR NEGATIVE NEGATIVE Final    Comment: (NOTE) The Xpert Xpress SARS-CoV-2/FLU/RSV assay is intended as an aid in  the diagnosis of influenza from Nasopharyngeal swab specimens and  should not be used as a sole basis for treatment. Nasal washings and  aspirates are unacceptable for Xpert Xpress SARS-CoV-2/FLU/RSV  testing.  Fact Sheet for Patients: PinkCheek.be  Fact Sheet for Healthcare Providers: GravelBags.it  This test is not yet approved or cleared by the [place] FDA and  has been authorized for detection and/or diagnosis of SARS-CoV-2 by  FDA under an Emergency Use Authorization (EUA). This EUA will remain  in effect (meaning this test can be used) for the duration of the  Covid-19 declaration under Section 564(b)(1) of the Act, 21  U.S.C. section 360bbb-3(b)(1), unless the authorization is  terminated or revoked. Performed at [facility], [address] [place], [place] [address]   Blood Culture (routine x 2)     Status: Abnormal   Collection Time: 11/02/20  4:44 PM   Specimen: BLOOD  Result Value Ref Range Status   Specimen Description   Final    BLOOD RIGHT ANTECUBITAL Performed at [facility], [address] [place], [place] [address]    Special Requests   Final    BOTTLES DRAWN AEROBIC AND ANAEROBIC Blood Culture results may not be optimal due to an inadequate volume of blood received in culture bottles Performed at [facility], [address], [place],  [address]    Culture  Setup Time   Final  GRAM POSITIVE COCCI IN BOTH AEROBIC AND ANAEROBIC BOTTLES CRITICAL RESULT CALLED TO, READ BACK BY AND VERIFIED WITHVioleta Gelinas AT 3536 11/03/20 SDR Performed at [facility], [place] [address] [place], [place] [address]    Culture ENTEROCOCCUS  FAECALIS (A)  Final   Report Status 11/05/2020 FINAL  Final   Organism ID, Bacteria ENTEROCOCCUS FAECALIS  Final      Susceptibility   Enterococcus faecalis - MIC*    AMPICILLIN <=2 SENSITIVE Sensitive     VANCOMYCIN 1 SENSITIVE Sensitive     GENTAMICIN SYNERGY SENSITIVE Sensitive     * ENTEROCOCCUS FAECALIS  Blood Culture ID Panel (Reflexed)     Status: Abnormal   Collection Time: 11/02/20  4:44 PM  Result Value Ref Range Status   Enterococcus faecalis DETECTED (A) NOT DETECTED Final    Comment: CRITICAL RESULT CALLED TO, READ BACK BY AND VERIFIED WITH: JASON ROBBONS AT 5400 11/03/20 SDR    Enterococcus Faecium NOT DETECTED NOT DETECTED Final   Listeria monocytogenes NOT DETECTED NOT DETECTED Final   Staphylococcus species NOT DETECTED NOT DETECTED Final   Staphylococcus aureus (BCID) NOT DETECTED NOT DETECTED Final   Staphylococcus epidermidis NOT DETECTED NOT DETECTED Final   Staphylococcus lugdunensis NOT DETECTED NOT DETECTED Final   Streptococcus species NOT DETECTED NOT DETECTED Final   Streptococcus agalactiae NOT DETECTED NOT DETECTED Final   Streptococcus pneumoniae NOT DETECTED NOT DETECTED Final   Streptococcus pyogenes NOT DETECTED NOT DETECTED Final   A.calcoaceticus-baumannii NOT DETECTED NOT DETECTED Final   Bacteroides fragilis NOT DETECTED NOT DETECTED Final   Enterobacterales NOT DETECTED NOT DETECTED Final   Enterobacter cloacae complex NOT DETECTED NOT DETECTED Final   Escherichia coli NOT DETECTED NOT DETECTED Final   Klebsiella aerogenes NOT DETECTED NOT DETECTED Final   Klebsiella oxytoca NOT DETECTED NOT DETECTED Final   Klebsiella pneumoniae NOT DETECTED NOT DETECTED Final   Proteus species NOT DETECTED NOT DETECTED Final   Salmonella species NOT DETECTED NOT DETECTED Final   Serratia marcescens NOT DETECTED NOT DETECTED Final   Haemophilus influenzae NOT DETECTED NOT DETECTED Final   Neisseria meningitidis NOT DETECTED NOT DETECTED Final   Pseudomonas  aeruginosa NOT DETECTED NOT DETECTED Final   Stenotrophomonas maltophilia NOT DETECTED NOT DETECTED Final   Candida albicans NOT DETECTED NOT DETECTED Final   Candida auris NOT DETECTED NOT DETECTED Final   Candida glabrata NOT DETECTED NOT DETECTED Final   Candida krusei NOT DETECTED NOT DETECTED Final   Candida parapsilosis NOT DETECTED NOT DETECTED Final   Candida tropicalis NOT DETECTED NOT DETECTED Final   Cryptococcus neoformans/gattii NOT DETECTED NOT DETECTED Final   Vancomycin resistance NOT DETECTED NOT DETECTED Final    Comment: Performed at [facility], [address] [place], [place] [address]  Blood Culture (routine x 2)     Status: Abnormal   Collection Time: 11/02/20  4:49 PM   Specimen: BLOOD  Result Value Ref Range Status   Specimen Description   Final    BLOOD BLOOD RIGHT WRIST Performed at [facility], [address] [place], [place] [address]    Special Requests   Final    BOTTLES DRAWN AEROBIC AND ANAEROBIC Blood Culture results may not be optimal due to an excessive volume of blood received in culture bottles Performed at [facility], [address] [place], [place] [address]    Culture  Setup Time   Final    GRAM POSITIVE COCCI IN BOTH AEROBIC AND ANAEROBIC  BOTTLES CRITICAL VALUE NOTED.  VALUE IS CONSISTENT WITH PREVIOUSLY REPORTED AND CALLED VALUE. Performed at [facility], [address] [place], [place] [address]    Culture (A)  Final    ENTEROCOCCUS FAECALIS SUSCEPTIBILITIES PERFORMED ON PREVIOUS CULTURE WITHIN THE LAST 5 DAYS. Performed at [facility], [place] [address] [place], [place] [address]    Report Status 11/05/2020 FINAL  Final  Body fluid culture     Status: None   Collection Time: 11/03/20  9:35 AM   Specimen: Peritoneal Dialysate  Result Value Ref Range Status   Specimen Description   Final    PERITONEAL DIALYSATE Performed at [facility], [address] [place],  [place] [address]    Special Requests   Final    NONE Performed at [facility], [address] [place], [place] [address]    Gram Stain   Final    RARE WBC PRESENT,BOTH PMN AND MONONUCLEAR NO ORGANISMS SEEN    Culture   Final    NO GROWTH 3 DAYS Performed at [facility], [place] [address] [place], [place] [address]    Report Status 11/06/2020 FINAL  Final  Cath Tip Culture     Status: Abnormal   Collection Time: 11/03/20  3:22 PM   Specimen: Urine, Catheterized; Other  Result Value Ref Range Status   Specimen Description   Final    URINE, CATHETERIZED Performed at [facility], [address] [place], [place] [address]    Special Requests   Final    CTC Performed at [facility], [address] [place],  [address]    Culture ENTEROCOCCUS FAECALIS (A)  Final   Report Status 11/06/2020 FINAL  Final   Organism ID, Bacteria ENTEROCOCCUS FAECALIS  Final      Susceptibility   Enterococcus faecalis - MIC*    AMPICILLIN <=2 SENSITIVE Sensitive     NITROFURANTOIN <=16 SENSITIVE Sensitive     VANCOMYCIN 1 SENSITIVE Sensitive     * ENTEROCOCCUS FAECALIS  Gastrointestinal Panel by PCR , Stool     Status: Abnormal   Collection Time: 11/03/20  9:38 PM   Specimen: STOOL  Result Value Ref Range Status   Campylobacter species NOT DETECTED NOT DETECTED Final   Plesimonas shigelloides DETECTED (A) NOT DETECTED Final    Comment: RESULT CALLED TO, READ BACK BY AND VERIFIED WITH: SKYLAR HARDIN RN 2323 11/03/20 HNM    Salmonella species NOT DETECTED NOT DETECTED Final   Yersinia enterocolitica NOT DETECTED NOT DETECTED Final   Vibrio species NOT DETECTED NOT DETECTED Final   Vibrio cholerae NOT DETECTED NOT DETECTED Final   Enteroaggregative E coli (EAEC) NOT DETECTED NOT DETECTED Final   Enteropathogenic E coli (EPEC) NOT DETECTED NOT DETECTED Final   Enterotoxigenic E coli (ETEC) NOT DETECTED NOT DETECTED Final   Shiga like toxin producing E coli  (STEC) NOT DETECTED NOT DETECTED Final   Shigella/Enteroinvasive E coli (EIEC) NOT DETECTED NOT DETECTED Final   Cryptosporidium NOT DETECTED NOT DETECTED Final   Cyclospora cayetanensis NOT DETECTED NOT DETECTED Final   Entamoeba histolytica NOT DETECTED NOT DETECTED Final   Giardia lamblia NOT DETECTED NOT DETECTED Final   Adenovirus F40/41 NOT DETECTED NOT DETECTED Final   Astrovirus NOT DETECTED NOT DETECTED Final   Norovirus GI/GII NOT DETECTED NOT DETECTED Final   Rotavirus A NOT DETECTED NOT DETECTED Final   Sapovirus (I, II, IV, and V) NOT DETECTED NOT DETECTED Final  Comment: Performed at [facility], [address], [place], [place] [address]  Urine culture     Status: None   Collection Time: 11/03/20  9:39 PM   Specimen: In/Out Cath Urine  Result Value Ref Range Status   Specimen Description   Final    IN/OUT CATH URINE Performed at [facility], [address] [place], [place] [address]    Special Requests   Final    NONE Performed at [facility] [facility], [address], [place], [place] [address]    Culture   Final    NO GROWTH Performed at [facility], [place] [address] [place], [place] [address]    Report Status 11/04/2020 FINAL  Final  C Difficile Quick Screen w PCR reflex     Status: None   Collection Time: 11/03/20  9:39 PM   Specimen: STOOL  Result Value Ref Range Status   C Diff antigen NEGATIVE NEGATIVE Final   C Diff toxin NEGATIVE NEGATIVE Final   C Diff interpretation No C. difficile detected.  Final    Comment: Performed at [facility], [address] [place], [place] [address]  CULTURE, BLOOD (ROUTINE X 2) w Reflex to ID Panel     Status: None (Preliminary result)   Collection Time: 11/05/20 12:34 AM   Specimen: BLOOD  Result Value Ref Range Status   Specimen Description BLOOD RIGHT ANTECUBITAL  Final   Special Requests   Final    BOTTLES DRAWN AEROBIC AND ANAEROBIC Blood Culture adequate volume    Culture   Final    NO GROWTH 2 DAYS Performed at [facility], [address] [place], [place] [address]    Report Status PENDING  Incomplete  CULTURE, BLOOD (ROUTINE X 2) w Reflex to ID Panel     Status: None (Preliminary result)   Collection Time: 11/05/20 12:34 AM   Specimen: BLOOD  Result Value Ref Range Status   Specimen Description BLOOD BLOOD RIGHT HAND  Final   Special Requests   Final    BOTTLES DRAWN AEROBIC AND ANAEROBIC Blood Culture adequate volume   Culture   Final    NO GROWTH 2 DAYS Performed at [facility], [address] [place], [place] [address]    Report Status PENDING  Incomplete     Labs: BNP (last 3 results) Recent Labs    11/02/20 1230  BNP >3,614.4*   Basic Metabolic Panel: Recent Labs  Lab 11/02/20 1230 11/03/20 0349 11/04/20 0422 11/05/20 0342  NA 131* 134* 128* 132*  K 5.3* 4.2 4.5 3.8  CL 90* 94* 91* 97*  CO2 21* 26 23 29   GLUCOSE 98 156* 103* 109*  BUN 54* 34* 55* 32*  CREATININE 7.31* 5.21* 7.42* 5.08*  CALCIUM 9.2 8.7* 8.5* 8.3*  MG 1.6*  --   --   --    Liver Function Tests: Recent Labs  Lab 11/02/20 1236 11/03/20 0349 11/04/20 0422 11/05/20 0342  AST 74* 133* 92* 50*  ALT 50* 86* 97* 78*  ALKPHOS 146* 129* 144* 148*  BILITOT 1.3* 1.0 0.8 0.8  PROT 8.1 7.0 6.8 6.9  ALBUMIN 3.4* 3.0* 2.7* 2.7*   No results for input(s): LIPASE, AMYLASE in the last 168 hours. No results for input(s): AMMONIA in the last 168 hours. CBC: Recent Labs  Lab 11/02/20 1230 11/03/20 0349 11/04/20 0422 11/05/20 0342  WBC 11.6* 13.4* 9.6 8.2  HGB 9.6* 8.6* 8.0* 8.2*  HCT 30.4* 26.6* 25.8* 26.0*  MCV 83.7 81.6 83.2 82.8  PLT 175 140* 124* 137*   Cardiac Enzymes: No results for input(s): CKTOTAL, CKMB, CKMBINDEX, TROPONINI in the last 168 hours. BNP: Invalid input(s): POCBNP CBG: Recent Labs  Lab 11/02/20 1555 11/02/20 1631  GLUCAP 60* 80   D-Dimer No results for input(s): DDIMER in the last 72 hours. Hgb  A1c No results for input(s): HGBA1C in the last 72 hours. Lipid Profile No results for input(s): CHOL, HDL, LDLCALC, TRIG, CHOLHDL, LDLDIRECT in the last 72 hours. Thyroid function studies No results for input(s): TSH, T4TOTAL, T3FREE, THYROIDAB in the last 72 hours.  Invalid input(s): FREET3 Anemia work up No results for input(s): VITAMINB12, FOLATE, FERRITIN, TIBC, IRON, RETICCTPCT in the last 72 hours. Urinalysis    Component Value Date/Time   COLORURINE AMBER (A) 11/03/2020 2139   APPEARANCEUR CLOUDY (A) 11/03/2020 2139   LABSPEC 1.015 11/03/2020 2139   PHURINE 6.0 11/03/2020 2139   GLUCOSEU NEGATIVE 11/03/2020 2139   HGBUR SMALL (A) 11/03/2020 2139   BILIRUBINUR NEGATIVE 11/03/2020 2139   KETONESUR NEGATIVE 11/03/2020 2139   PROTEINUR >=300 (A) 11/03/2020 2139   NITRITE NEGATIVE 11/03/2020 2139   LEUKOCYTESUR SMALL (A) 11/03/2020 2139   Sepsis Labs Invalid input(s): PROCALCITONIN,  WBC,  LACTICIDVEN Microbiology Recent Results (from the past 240 hour(s))  Respiratory Panel by RT PCR (Flu A&B, Covid) - Nasopharyngeal Swab     Status: None   Collection Time: 11/02/20  3:32 PM   Specimen: Nasopharyngeal Swab  Result Value Ref Range Status   SARS Coronavirus 2 by RT PCR NEGATIVE NEGATIVE Final    Comment: (NOTE) SARS-CoV-2 target nucleic acids are NOT DETECTED.  The SARS-CoV-2 RNA is generally detectable in upper respiratoy specimens during the acute phase of infection. The lowest concentration of SARS-CoV-2 viral copies this assay can detect is 131 copies/mL. A negative result does not preclude SARS-Cov-2 infection and should not be used as the sole basis for treatment or other patient management decisions. A negative result may occur with  improper specimen collection/handling, submission of specimen other than nasopharyngeal swab, presence of viral mutation(s) within the areas targeted by this assay, and inadequate number of viral copies (<131 copies/mL). A negative  result must be combined with clinical observations, patient history, and epidemiological information. The expected result is Negative.  Fact Sheet for Patients:  PinkCheek.be  Fact Sheet for Healthcare Providers:  GravelBags.it  This test is no t yet approved or cleared by the [place] FDA and  has been authorized for detection and/or diagnosis of SARS-CoV-2 by FDA under an Emergency Use Authorization (EUA). This EUA will remain  in effect (meaning this test can be used) for the duration of the COVID-19 declaration under Section 564(b)(1) of the Act, 21 U.S.C. section 360bbb-3(b)(1), unless the authorization is terminated or revoked sooner.     Influenza A by PCR NEGATIVE NEGATIVE Final   Influenza B by PCR NEGATIVE NEGATIVE Final    Comment: (NOTE) The Xpert Xpress SARS-CoV-2/FLU/RSV assay is intended as an aid in  the diagnosis of influenza from Nasopharyngeal swab specimens and  should not be used as a sole basis for treatment. Nasal washings and  aspirates are unacceptable for Xpert Xpress SARS-CoV-2/FLU/RSV  testing.  Fact Sheet for Patients: PinkCheek.be  Fact Sheet for Healthcare Providers: GravelBags.it  This test is not yet approved or cleared by the [place] FDA and  has been authorized for detection and/or diagnosis of SARS-CoV-2 by  FDA under an Emergency Use Authorization (EUA). This EUA  will remain  in effect (meaning this test can be used) for the duration of the  Covid-19 declaration under Section 564(b)(1) of the Act, 21  U.S.C. section 360bbb-3(b)(1), unless the authorization is  terminated or revoked. Performed at [facility], [address], [place], [place] [address]   Blood Culture (routine x 2)     Status: Abnormal   Collection Time: 11/02/20  4:44 PM   Specimen: BLOOD  Result Value Ref Range Status   Specimen  Description   Final    BLOOD RIGHT ANTECUBITAL Performed at [facility], [address] [place], [place] [address]    Special Requests   Final    BOTTLES DRAWN AEROBIC AND ANAEROBIC Blood Culture results may not be optimal due to an inadequate volume of blood received in culture bottles Performed at [facility], [address], [place], [place] [address]    Culture  Setup Time   Final    GRAM POSITIVE COCCI IN BOTH AEROBIC AND ANAEROBIC BOTTLES CRITICAL RESULT CALLED TO, READ BACK BY AND VERIFIED WITHVioleta Gelinas AT 8676 11/03/20 SDR Performed at [facility], [place] [address], [place], [place] [address]    Culture ENTEROCOCCUS FAECALIS (A)  Final   Report Status 11/05/2020 FINAL  Final   Organism ID, Bacteria ENTEROCOCCUS FAECALIS  Final      Susceptibility   Enterococcus faecalis - MIC*    AMPICILLIN <=2 SENSITIVE Sensitive     VANCOMYCIN 1 SENSITIVE Sensitive     GENTAMICIN SYNERGY SENSITIVE Sensitive     * ENTEROCOCCUS FAECALIS  Blood Culture ID Panel (Reflexed)     Status: Abnormal   Collection Time: 11/02/20  4:44 PM  Result Value Ref Range Status   Enterococcus faecalis DETECTED (A) NOT DETECTED Final    Comment: CRITICAL RESULT CALLED TO, READ BACK BY AND VERIFIED WITH: JASON ROBBONS AT 3267 11/03/20 SDR    Enterococcus Faecium NOT DETECTED NOT DETECTED Final   Listeria monocytogenes NOT DETECTED NOT DETECTED Final   Staphylococcus species NOT DETECTED NOT DETECTED Final   Staphylococcus aureus (BCID) NOT DETECTED NOT DETECTED Final   Staphylococcus epidermidis NOT DETECTED NOT DETECTED Final   Staphylococcus lugdunensis NOT DETECTED NOT DETECTED Final   Streptococcus species NOT DETECTED NOT DETECTED Final   Streptococcus agalactiae NOT DETECTED NOT DETECTED Final   Streptococcus pneumoniae NOT DETECTED NOT DETECTED Final   Streptococcus pyogenes NOT DETECTED NOT DETECTED Final   A.calcoaceticus-baumannii NOT DETECTED NOT DETECTED Final    Bacteroides fragilis NOT DETECTED NOT DETECTED Final   Enterobacterales NOT DETECTED NOT DETECTED Final   Enterobacter cloacae complex NOT DETECTED NOT DETECTED Final   Escherichia coli NOT DETECTED NOT DETECTED Final   Klebsiella aerogenes NOT DETECTED NOT DETECTED Final   Klebsiella oxytoca NOT DETECTED NOT DETECTED Final   Klebsiella pneumoniae NOT DETECTED NOT DETECTED Final   Proteus species NOT DETECTED NOT DETECTED Final   Salmonella species NOT DETECTED NOT DETECTED Final   Serratia marcescens NOT DETECTED NOT DETECTED Final   Haemophilus influenzae NOT DETECTED NOT DETECTED Final   Neisseria meningitidis NOT DETECTED NOT DETECTED Final   Pseudomonas aeruginosa NOT DETECTED NOT DETECTED Final   Stenotrophomonas maltophilia NOT DETECTED NOT DETECTED Final   Candida albicans NOT DETECTED NOT DETECTED Final   Candida auris NOT DETECTED NOT DETECTED Final   Candida glabrata NOT DETECTED NOT DETECTED Final   Candida krusei NOT DETECTED NOT DETECTED Final   Candida parapsilosis NOT DETECTED NOT DETECTED Final  Candida tropicalis NOT DETECTED NOT DETECTED Final   Cryptococcus neoformans/gattii NOT DETECTED NOT DETECTED Final   Vancomycin resistance NOT DETECTED NOT DETECTED Final    Comment: Performed at [facility], [address] [place], [place] [address]  Blood Culture (routine x 2)     Status: Abnormal   Collection Time: 11/02/20  4:49 PM   Specimen: BLOOD  Result Value Ref Range Status   Specimen Description   Final    BLOOD BLOOD RIGHT WRIST Performed at [facility], [address] [place], [place] [address]    Special Requests   Final    BOTTLES DRAWN AEROBIC AND ANAEROBIC Blood Culture results may not be optimal due to an excessive volume of blood received in culture bottles Performed at [facility], [address] [place], [place] [address]    Culture  Setup Time   Final    GRAM POSITIVE COCCI IN BOTH AEROBIC AND ANAEROBIC  BOTTLES CRITICAL VALUE NOTED.  VALUE IS CONSISTENT WITH PREVIOUSLY REPORTED AND CALLED VALUE. Performed at [facility], [address], [place], [place] [address]    Culture (A)  Final    ENTEROCOCCUS FAECALIS SUSCEPTIBILITIES PERFORMED ON PREVIOUS CULTURE WITHIN THE LAST 5 DAYS. Performed at [facility], [place] [address], [place], [place] [address]    Report Status 11/05/2020 FINAL  Final  Body fluid culture     Status: None   Collection Time: 11/03/20  9:35 AM   Specimen: Peritoneal Dialysate  Result Value Ref Range Status   Specimen Description   Final    PERITONEAL DIALYSATE Performed at [facility], [address], [place], [place] [address]    Special Requests   Final    NONE Performed at [facility], [address] [place], [place] [address]    Gram Stain   Final    RARE WBC PRESENT,BOTH PMN AND MONONUCLEAR NO ORGANISMS SEEN    Culture   Final    NO GROWTH 3 DAYS Performed at [facility], [place] [address], [place], [place] [address]    Report Status 11/06/2020 FINAL  Final  Cath Tip Culture     Status: Abnormal   Collection Time: 11/03/20  3:22 PM   Specimen: Urine, Catheterized; Other  Result Value Ref Range Status   Specimen Description   Final    URINE, CATHETERIZED Performed at [facility], [address] [place], [place] [address]    Special Requests   Final    CTC Performed at [facility], [address] [place],  [address]    Culture ENTEROCOCCUS FAECALIS (A)  Final   Report Status 11/06/2020 FINAL  Final   Organism ID, Bacteria ENTEROCOCCUS FAECALIS  Final      Susceptibility   Enterococcus faecalis - MIC*    AMPICILLIN <=2 SENSITIVE Sensitive     NITROFURANTOIN <=16 SENSITIVE Sensitive     VANCOMYCIN 1 SENSITIVE Sensitive     * ENTEROCOCCUS FAECALIS  Gastrointestinal Panel by PCR , Stool     Status: Abnormal   Collection Time: 11/03/20  9:38 PM   Specimen: STOOL  Result  Value Ref Range Status   Campylobacter species NOT DETECTED NOT DETECTED Final   Plesimonas shigelloides DETECTED (A) NOT DETECTED Final    Comment: RESULT CALLED TO, READ BACK BY AND VERIFIED WITH: SKYLAR HARDIN RN [id number] 11/03/20 HNM    Salmonella species NOT DETECTED NOT DETECTED Final   Yersinia enterocolitica NOT DETECTED NOT  DETECTED Final   Vibrio species NOT DETECTED NOT DETECTED Final   Vibrio cholerae NOT DETECTED NOT DETECTED Final   Enteroaggregative E coli (EAEC) NOT DETECTED NOT DETECTED Final   Enteropathogenic E coli (EPEC) NOT DETECTED NOT DETECTED Final   Enterotoxigenic E coli (ETEC) NOT DETECTED NOT DETECTED Final   Shiga like toxin producing E coli (STEC) NOT DETECTED NOT DETECTED Final   Shigella/Enteroinvasive E coli (EIEC) NOT DETECTED NOT DETECTED Final   Cryptosporidium NOT DETECTED NOT DETECTED Final   Cyclospora cayetanensis NOT DETECTED NOT DETECTED Final   Entamoeba histolytica NOT DETECTED NOT DETECTED Final   Giardia lamblia NOT DETECTED NOT DETECTED Final   Adenovirus F40/41 NOT DETECTED NOT DETECTED Final   Astrovirus NOT DETECTED NOT DETECTED Final   Norovirus GI/GII NOT DETECTED NOT DETECTED Final   Rotavirus A NOT DETECTED NOT DETECTED Final   Sapovirus (I, II, IV, and V) NOT DETECTED NOT DETECTED Final    Comment: Performed at [facility], [address], [place], [place] [address]  Urine culture     Status: None   Collection Time: 11/03/20  9:39 PM   Specimen: In/Out Cath Urine  Result Value Ref Range Status   Specimen Description   Final    IN/OUT CATH URINE Performed at [facility], [address] [place], [place] [address]    Special Requests   Final    NONE Performed at [facility], [address], [place], [place] [address]    Culture   Final    NO GROWTH Performed at [facility], [place] [address] [place], [place] [address]    Report Status 11/04/2020 FINAL  Final  C Difficile Quick Screen w PCR  reflex     Status: None   Collection Time: 11/03/20  9:39 PM   Specimen: STOOL  Result Value Ref Range Status   C Diff antigen NEGATIVE NEGATIVE Final   C Diff toxin NEGATIVE NEGATIVE Final   C Diff interpretation No C. difficile detected.  Final    Comment: Performed at [facility], [address] [place], [place] [address]  CULTURE, BLOOD (ROUTINE X 2) w Reflex to ID Panel     Status: None (Preliminary result)   Collection Time: 11/05/20 12:34 AM   Specimen: BLOOD  Result Value Ref Range Status   Specimen Description BLOOD RIGHT ANTECUBITAL  Final   Special Requests   Final    BOTTLES DRAWN AEROBIC AND ANAEROBIC Blood Culture adequate volume   Culture   Final    NO GROWTH 2 DAYS Performed at [facility], [address] [place], [place] [address]    Report Status PENDING  Incomplete  CULTURE, BLOOD (ROUTINE X 2) w Reflex to ID Panel     Status: None (Preliminary result)   Collection Time: 11/05/20 12:34 AM   Specimen: BLOOD  Result Value Ref Range Status   Specimen Description BLOOD BLOOD RIGHT HAND  Final   Special Requests   Final    BOTTLES DRAWN AEROBIC AND ANAEROBIC Blood Culture adequate volume   Culture   Final    NO GROWTH 2 DAYS Performed at [facility], [address] [place], [place] [address]    Report Status PENDING  Incomplete     SIGNED:  Dessa Phi, DO [facility] 11/07/2020, 12:31 PM

## 2020-11-10 LAB — CULTURE, BLOOD (ROUTINE X 2)
Culture: NO GROWTH
Culture: NO GROWTH
Special Requests: ADEQUATE
Special Requests: ADEQUATE

## 2020-11-11 LAB — STOOL CULTURE REFLEX - RSASHR

## 2020-11-11 LAB — STOOL CULTURE REFLEX - CMPCXR

## 2020-11-11 LAB — STOOL CULTURE: E coli, Shiga toxin Assay: NEGATIVE

## 2020-11-29 ENCOUNTER — Other Ambulatory Visit: Payer: Self-pay | Admitting: Student

## 2020-12-01 ENCOUNTER — Ambulatory Visit (INDEPENDENT_AMBULATORY_CARE_PROVIDER_SITE_OTHER): Payer: Medicare Other | Admitting: Vascular Surgery

## 2020-12-01 ENCOUNTER — Other Ambulatory Visit: Payer: Self-pay

## 2020-12-01 VITALS — BP 128/70 | HR 86 | Ht 66.0 in | Wt 181.0 lb

## 2020-12-01 DIAGNOSIS — I5032 Chronic diastolic (congestive) heart failure: Secondary | ICD-10-CM

## 2020-12-01 DIAGNOSIS — I10 Essential (primary) hypertension: Secondary | ICD-10-CM | POA: Diagnosis not present

## 2020-12-01 DIAGNOSIS — J449 Chronic obstructive pulmonary disease, unspecified: Secondary | ICD-10-CM

## 2020-12-01 DIAGNOSIS — N186 End stage renal disease: Secondary | ICD-10-CM

## 2020-12-01 DIAGNOSIS — T829XXS Unspecified complication of cardiac and vascular prosthetic device, implant and graft, sequela: Secondary | ICD-10-CM

## 2020-12-01 DIAGNOSIS — Z992 Dependence on renal dialysis: Secondary | ICD-10-CM

## 2020-12-01 NOTE — Progress Notes (Signed)
MRN : [id number]  Janet Olson is a 50 y.o. (1970/08/27) female who presents with chief complaint of  Chief Complaint  Patient presents with  . Follow-up    pt req.  haviing pain and had some concerns after havign perm cath removed  .  History of Present Illness:   The patient returns to the office for followup of their dialysis access. The function of the access has been stable. The patient denies increased bleeding time or increased recirculation. Patient denies difficulty with cannulation. The patient denies hand pain or other symptoms consistent with steal phenomena.  No significant arm swelling but she is having some arm pain that begins in the shoulder and radiates down to the fingers.  She also notes she can't sleep on her left side because that makes her arm hurt as well.  The patient denies redness or swelling at the access site. The patient denies fever or chills at home or while on dialysis.  The patient denies amaurosis fugax or recent TIA symptoms. There are no recent neurological changes noted. The patient denies claudication symptoms or rest pain symptoms. The patient denies history of DVT, PE or superficial thrombophlebitis. The patient denies recent episodes of angina or shortness of breath.        Current Meds  Medication Sig  . albuterol (PROVENTIL) (2.5 MG/3ML) 0.083% nebulizer solution Take 2.5 mg by nebulization every 6 (six) hours as needed for wheezing or shortness of breath.   Marland Kitchen albuterol (VENTOLIN HFA) 108 (90 Base) MCG/ACT inhaler Inhale 2 puffs into the lungs every 6 (six) hours as needed for wheezing or shortness of breath.   Marland Kitchen amLODipine (NORVASC) 10 MG tablet Take 1 tablet (10 mg total) by mouth daily.  . benzonatate (TESSALON) 100 MG capsule Take 100 mg by mouth 2 (two) times daily as needed for cough.   . calcium acetate (PHOSLO) 667 MG capsule Take 2 capsules (1,334 mg total) by mouth 3 (three) times daily with meals.  .  chlorpheniramine-HYDROcodone (TUSSIONEX) 10-8 MG/5ML SUER Take 5 mLs by mouth daily as needed for cough.   . clotrimazole (MYCELEX) 10 MG troche Take by mouth.  . docusate sodium (COLACE) 100 MG capsule Take 100 mg by mouth daily as needed for moderate constipation.   . DULERA 200-5 MCG/ACT AERO Inhale 2 puffs into the lungs 2 (two) times daily.  . DULoxetine (CYMBALTA) 30 MG capsule Take 30 mg by mouth 2 (two) times daily.  . hydrOXYzine (ATARAX/VISTARIL) 25 MG tablet Take 25 mg by mouth every 8 (eight) hours as needed for itching.   . Melatonin 10 MG CAPS Take 10 mg by mouth at bedtime as needed (sleep).   . metoprolol tartrate (LOPRESSOR) 50 MG tablet Take 50 mg by mouth 2 (two) times daily.  . multivitamin (RENA-VIT) TABS tablet Take 1 tablet by mouth daily.   Marland Kitchen oxyCODONE (OXY IR/ROXICODONE) 5 MG immediate release tablet Take 1 tablet (5 mg total) by mouth every 6 (six) hours as needed for moderate pain or severe pain.  Marland Kitchen oxyCODONE-acetaminophen (PERCOCET) 5-325 MG tablet Take 1-2 tablets by mouth every 6 (six) hours as needed for moderate pain or severe pain.  . pantoprazole (PROTONIX) 40 MG tablet Take 1 tablet (40 mg total) by mouth daily.  . QUEtiapine (SEROQUEL) 25 MG tablet Take 25 mg by mouth at bedtime as needed (sleep).   . torsemide (DEMADEX) 100 MG tablet Take 1 tablet (100 mg total) by mouth daily.  . Vitamin D, Ergocalciferol, (  DRISDOL) 50000 units CAPS capsule Take 50,000 Units by mouth every Sunday.     Past Medical History:  Diagnosis Date  . (HFpEF) heart failure with preserved ejection fraction ([place])    a. 2015 Echo: Nl EF. Mild to mod LVH. Mild PR/MR; b. 04/2017 Echo: EF 55-60%, no rwma, Gr1 DD. Mildly dil LA. Small effusion; c. 12/2018 Echo: EF 55-60%, mildly dil LA/PA. Mod dil RA. Mild to mod RV dil. Mildly reduced RV fxn. Mild TR. Mod elev PASP.  Marland Kitchen Abdominal ascites   . Acute hypoxemic respiratory failure ([place]) 01/03/2019  . Acute renal failure (ARF) ([place]) 05/17/2017  .  Acute respiratory failure ([place]) 09/25/2018  . Anemia   . Anxiety   . Arthritis   . Asthma   . Cardiomegaly   . COPD (chronic obstructive pulmonary disease) ([place])   . Depression   . Dialysis patient ([place])    MON, WED , FRI  . Dyspnea   . ESRD (end stage renal disease) ([place])   . GERD (gastroesophageal reflux disease)   . Headache    Migraines  . Hypertension   . Nonrheumatic tricuspid (valve) insufficiency   . Respiratory failure with hypoxia ([place]) 04/21/2018  . Ulcer    gastric  . Vitamin D deficiency     Past Surgical History:  Procedure Laterality Date  . A/V FISTULAGRAM Left 04/17/2018   Procedure: A/V FISTULAGRAM;  Surgeon: Algernon Huxley, MD;  Location: [facility];  Service: Cardiovascular;  Laterality: Left;  . A/V FISTULAGRAM Left 05/27/2018   Procedure: A/V FISTULAGRAM;  Surgeon: Katha Cabal, MD;  Location: [facility];  Service: Cardiovascular;  Laterality: Left;  . A/V FISTULAGRAM Left 07/05/2020   Procedure: A/V FISTULAGRAM;  Surgeon: Katha Cabal, MD;  Location: [facility];  Service: Cardiovascular;  Laterality: Left;  . AV FISTULA PLACEMENT Left 08/08/2017   Procedure: ARTERIOVENOUS (AV) FISTULA CREATION ( BRACHIOCEPHALIC );  Surgeon: Algernon Huxley, MD;  Location: [facility];  Service: Vascular;  Laterality: Left;  . AV FISTULA PLACEMENT Left 08/05/2020   Procedure: INSERTION OF ARTERIOVENOUS (AV) GRAFT ARM(BRACHIAL AXILLARY);  Surgeon: Katha Cabal, MD;  Location: [facility];  Service: Vascular;  Laterality: Left;  . CESAREAN SECTION     x 4 (1988, 1991, 1997, 2000 )  . COLONOSCOPY WITH PROPOFOL N/A 04/24/2018   Procedure: COLONOSCOPY WITH PROPOFOL;  Surgeon: Lin Landsman, MD;  Location: [facility];  Service: Gastroenterology;  Laterality: N/A;  . DIALYSIS/PERMA CATHETER INSERTION N/A 07/16/2017   Procedure: Dialysis/Perma Catheter Insertion;  Surgeon: Katha Cabal, MD;  Location: [facility];  Service:  Cardiovascular;  Laterality: N/A;  . DIALYSIS/PERMA CATHETER REMOVAL N/A 10/29/2017   Procedure: DIALYSIS/PERMA CATHETER REMOVAL;  Surgeon: Katha Cabal, MD;  Location: [facility];  Service: Cardiovascular;  Laterality: N/A;  . DIALYSIS/PERMA CATHETER REMOVAL N/A 11/03/2020   Procedure: DIALYSIS/PERMA CATHETER REMOVAL;  Surgeon: Algernon Huxley, MD;  Location: [facility];  Service: Cardiovascular;  Laterality: N/A;  . excision of lymph nodes Bilateral 2014   bilateral under arms.  Marland Kitchen PARATHYROID IMPLANT REMOVAL    . REMOVAL OF A DIALYSIS CATHETER Left 08/05/2020   Procedure: REMOVAL OF A DIALYSIS CATHETER ( FISTULA );  Surgeon: Katha Cabal, MD;  Location: [facility];  Service: Vascular;  Laterality: Left;    Social History Social History   Tobacco Use  . Smoking status: Current Every Day Smoker    Packs/day: 0.50  Years: 30.00    Pack years: 15.00    Types: Cigarettes  . Smokeless tobacco: Never Used  Vaping Use  . Vaping Use: Never used  Substance Use Topics  . Alcohol use: No  . Drug use: No    Family History Family History  Problem Relation Age of Onset  . Diabetes Mother     No Known Allergies   REVIEW OF SYSTEMS (Negative unless checked)  Constitutional: [] Weight loss  [] Fever  [] Chills Cardiac: [] Chest pain   [] Chest pressure   [] Palpitations   [] Shortness of breath when laying flat   [] Shortness of breath with exertion. Vascular:  [] Pain in legs with walking   [] Pain in legs at rest  [] History of DVT   [] Phlebitis   [] Swelling in legs   [] Varicose veins   [] Non-healing ulcers Pulmonary:   [] Uses home oxygen   [] Productive cough   [] Hemoptysis   [] Wheeze  [] COPD   [] Asthma Neurologic:  [] Dizziness   [] Seizures   [] History of stroke   [] History of TIA  [] Aphasia   [] Vissual changes   [] Weakness or numbness in arm   [] Weakness or numbness in leg Musculoskeletal:   [] Joint swelling   [x] Joint pain   [] Low back pain Hematologic:  [] Easy  bruising  [] Easy bleeding   [] Hypercoagulable state   [] Anemic Gastrointestinal:  [] Diarrhea   [] Vomiting  [] Gastroesophageal reflux/heartburn   [] Difficulty swallowing. Genitourinary:  [x] Chronic kidney disease   [] Difficult urination  [] Frequent urination   [] Blood in urine Skin:  [] Rashes   [] Ulcers  Psychological:  [] History of anxiety   []  History of major depression.  Physical Examination  Vitals:   12/01/20 1324  BP: 128/70  Pulse: 86  Weight: 181 lb (82.1 kg)  Height: 5\' 6"  (1.676 m)   Body mass index is 29.21 kg/m. Gen: WD/WN, NAD Head: [place]/AT, No temporalis wasting.  Ear/Nose/Throat: Hearing grossly intact, nares w/o erythema or drainage Eyes: PER, EOMI, sclera nonicteric.  Neck: Supple, no large masses.   Pulmonary:  Good air movement, no audible wheezing bilaterally, no use of accessory muscles.  Cardiac: RRR, no JVD Vascular: left arm access good thrill good bruit Vessel Right Left  Radial Palpable Palpable  Brachial Palpable Palpable  Gastrointestinal: Non-distended. No guarding/no peritoneal signs.  Musculoskeletal: M/S 5/5 throughout.  No deformity or atrophy.  Neurologic: CN 2-12 intact. Symmetrical.  Speech is fluent. Motor exam as listed above. Psychiatric: Judgment intact, Mood & affect appropriate for pt's clinical situation. Dermatologic: No rashes or ulcers noted.  No changes consistent with cellulitis.   CBC Lab Results  Component Value Date   WBC 8.2 11/05/2020   HGB 8.2 (L) 11/05/2020   HCT 26.0 (L) 11/05/2020   MCV 82.8 11/05/2020   PLT 137 (L) 11/05/2020    BMET    Component Value Date/Time   NA 132 (L) 11/05/2020 0342   NA 139 09/23/2014 1741   K 3.8 11/05/2020 0342   K 4.2 09/23/2014 1741   CL 97 (L) 11/05/2020 0342   CL 112 (H) 09/23/2014 1741   CO2 29 11/05/2020 0342   CO2 21 09/23/2014 1741   GLUCOSE 109 (H) 11/05/2020 0342   GLUCOSE 82 09/23/2014 1741   BUN 32 (H) 11/05/2020 0342   BUN 18 09/23/2014 1741   CREATININE 5.08  (H) 11/05/2020 0342   CREATININE 1.41 (H) 09/23/2014 1741   CALCIUM 8.3 (L) 11/05/2020 0342   CALCIUM 8.4 (L) 01/03/2019 0243   GFRNONAA 10 (L) 11/05/2020 0342   GFRNONAA 43 (L)  09/23/2014 1741   GFRNONAA 43 (L) 11/20/2012 1129   GFRAA 3 (L) 08/08/2020 1303   GFRAA 52 (L) 09/23/2014 1741   GFRAA 49 (L) 11/20/2012 1129   CrCl cannot be calculated (Patient's most recent lab result is older than the maximum 21 days allowed.).  COAG Lab Results  Component Value Date   INR 1.4 (H) 11/03/2020   INR 1.5 (H) 11/02/2020   INR 1.2 08/02/2020    Radiology DG Chest 1 View  Result Date: 11/02/2020 CLINICAL DATA:  Chills during dialysis EXAM: CHEST  1 VIEW COMPARISON:  03/03/2020 FINDINGS: Single frontal view of the chest demonstrates right internal jugular dialysis catheter tip overlying superior vena cava. Cardiac silhouette remains enlarged. There is central vascular congestion with mild diffuse interstitial prominence. No airspace disease, effusion, or pneumothorax. No acute bony abnormality. IMPRESSION: 1. Vascular congestion and mild interstitial edema. 2. Stable enlarged cardiac silhouette. Electronically Signed   By: Randa Ngo M.D.   On: 11/02/2020 15:49   US Abdomen Complete  Result Date: 11/03/2020 CLINICAL DATA:  RIGHT upper quadrant abdominal pain; history end-stage renal disease on dialysis, smoker, COPD, CHF at EXAM: ABDOMEN ULTRASOUND COMPLETE COMPARISON:  11/12/2019 FINDINGS: Gallbladder: Mildly thickened gallbladder wall, a nonspecific finding in the setting of ascites. Small amount of dependent sludge within gallbladder. No shadowing calculi or sonographic Murphy sign. Common bile duct: Diameter: 3 mm, normal Liver: Normal echogenicity without mass. Hepatic margins appear minimally nodular raising question of cirrhosis. Portal vein is patent on color Doppler imaging with normal direction of blood flow towards the liver. IVC: Normal appearance Pancreas: Normal appearance.  Pancreatic duct upper normal caliber 2.7 mm. Spleen: Normal appearance, 11.6 cm length. Few collaterals at splenic hilum. Right Kidney: Length: 9.5 cm. Cortical thinning and atrophy. Increased cortical echogenicity. Small cyst at upper pole 14 x 12 x 14 mm. No additional masses or hydronephrosis. Left Kidney: Length: 9.0 cm. Cortical thinning. Increased cortical echogenicity. Tiny cyst at mid upper kidney 6 x 6 x 6 mm. No additional mass or hydronephrosis Abdominal aorta: Normal caliber Other findings: Minimal scattered ascites. IMPRESSION: Cortical thinning and medical renal disease changes of both kidneys with tiny renal cysts. Minimal sludge within gallbladder. Minimally nodular hepatic margins raising question of cirrhosis. Minimal scattered ascites Electronically Signed   By: Lavonia Dana M.D.   On: 11/03/2020 10:58   [place] Dialysis Access  Result Date: 11/06/2020 CLINICAL DATA:  Left upper extremity pain at the hemodialysis access graft. Patient underwent fistula removal and AV graft creation 08/05/2020. EXAM: LEFT UPPER EXTREMITY HEMODIALYSIS ACCESS ULTRASOUND TECHNIQUE: ULTRASOUND PERFORMED OF THE LEFT UPPER ARM AV GRAFT WITH COLOR FLOW AND VELOCITY EVALUATION. COMPARISON:  NONE. FINDINGS: Left upper arm brachial axillary AV graft appears patent. Widely patent brachial arterial anastomosis at the elbow. Graft lumen appears patent without thrombus or occlusion. Venous anastomosis to the axillary vein (labeled as cephalic vein) is also patent. No perigraft fluid collection. No surrounding hematoma. Inflow artery: 434 centimeters/second Arterial anastomosis: 476 centimeters/second Graft Proximal: 770 centimeters/second Mid: 220 centimeters/second Distal: 383 centimeters/second In the mid humerus region there is a thrombosed vein in the superficial soft tissues just deep to the patent AV graft which likely is related to the previous left upper extremity native fistula. IMPRESSION: Patent left upper extremity  brachial axillary AV graft circuit without thrombosis. No complicating feature to account for upper arm pain. Electronically Signed   By: Jerilynn Mages.  Shick M.D.   On: 11/06/2020 14:13   PERIPHERAL VASCULAR CATHETERIZATION  Result Date:  11/03/2020 See op note  US Paracentesis  Result Date: 11/03/2020 INDICATION: History of end-stage renal disease, on dialysis with recurrent symptomatic ascites. Please perform ultrasound-guided paracentesis for therapeutic purposes. EXAM: ULTRASOUND-GUIDED PARACENTESIS COMPARISON:  Multiple previous ultrasound-guided paracenteses, most recently 09/20/2020 yielding 4.9 L of peritoneal fluid. MEDICATIONS: None. COMPLICATIONS: None immediate. TECHNIQUE: Informed written consent was obtained from the patient after a discussion of the risks, benefits and alternatives to treatment. A timeout was performed prior to the initiation of the procedure. Initial ultrasound scanning demonstrates a small amount of ascites within the left lower abdomen which was subsequently prepped and draped in the usual sterile fashion. 1% lidocaine with epinephrine was used for local anesthesia. Under direct ultrasound guidance, a 19 gauge, 7-cm, Yueh catheter was introduced. An ultrasound image was saved for documentation purposed. The paracentesis was performed. The catheter was removed and a dressing was applied. The patient tolerated the procedure well without immediate post procedural complication. FINDINGS: A total of approximately 3.2 liters of serous fluid was removed. IMPRESSION: Successful ultrasound-guided paracentesis yielding 3.2 liters of peritoneal fluid. Electronically Signed   By: Sandi Mariscal M.D.   On: 11/03/2020 11:02   ECHOCARDIOGRAM COMPLETE  Result Date: 11/05/2020    ECHOCARDIOGRAM REPORT   Patient Name:   EVALISE ABRUZZESE Date of Exam: 11/05/2020 Medical Rec #:  [id number]        Height:       66.0 in Accession #:    [id number]       Weight:       170.0 lb Date of Birth:  [birth year]         BSA:          1.866 m Patient Age:    55 years         BP:           130/80 mmHg Patient Gender: F                HR:           88 bpm. Exam Location:  [facility] Procedure: 2D Echo Indications:     Bacteremia 790.7 / R78.81  History:         Patient has prior history of Echocardiogram examinations, most                  recent 02/23/2020. COPD, Signs/Symptoms:Shortness of Breath;                  Risk Factors:Hypertension. ESRD ON DIALYSIS.  Sonographer:     Avanell Shackleton Referring Phys:  [place] Phys: Kate Sable MD IMPRESSIONS  1. Left ventricular ejection fraction, by estimation, is 55 to 60%. Left ventricular ejection fraction by PLAX is 60 %. The left ventricle has normal function. The left ventricle has no regional wall motion abnormalities. There is mild left ventricular hypertrophy. Left ventricular diastolic parameters are indeterminate.  2. Right ventricular systolic function is normal. The right ventricular size is mildly enlarged. There is severely elevated pulmonary artery systolic pressure.  3. Left atrial size was mild to moderately dilated.  4. Right atrial size was moderately dilated.  5. The mitral valve is normal in structure. Mild mitral valve regurgitation.  6. The aortic valve is normal in structure. Aortic valve regurgitation is not visualized.  7. The inferior vena cava is dilated in size with <50% respiratory variability, suggesting right atrial pressure of 15 mmHg. Conclusion(s)/Recommendation(s): No evidence of valvular vegetations on this transthoracic echocardiogram. Would recommend  a transesophageal echocardiogram to exclude infective endocarditis if clinically indicated. FINDINGS  Left Ventricle: Left ventricular ejection fraction, by estimation, is 55 to 60%. Left ventricular ejection fraction by PLAX is 60 %. The left ventricle has normal function. The left ventricle has no regional wall motion abnormalities. The left ventricular internal cavity size was  normal in size. There is mild left ventricular hypertrophy. Left ventricular diastolic parameters are indeterminate. Right Ventricle: The right ventricular size is mildly enlarged. No increase in right ventricular wall thickness. Right ventricular systolic function is normal. There is severely elevated pulmonary artery systolic pressure. The tricuspid regurgitant velocity is 3.56 m/s, and with an assumed right atrial pressure of 15 mmHg, the estimated right ventricular systolic pressure is 25.8 mmHg. Left Atrium: Left atrial size was mild to moderately dilated. Right Atrium: Right atrial size was moderately dilated. Pericardium: There is no evidence of pericardial effusion. Mitral Valve: The mitral valve is normal in structure. Mild mitral valve regurgitation. Tricuspid Valve: The tricuspid valve is normal in structure. Tricuspid valve regurgitation is trivial. Aortic Valve: The aortic valve is normal in structure. Aortic valve regurgitation is not visualized. Pulmonic Valve: The pulmonic valve was normal in structure. Pulmonic valve regurgitation is mild. Aorta: The aortic root is normal in size and structure. Venous: The inferior vena cava is dilated in size with less than 50% respiratory variability, suggesting right atrial pressure of 15 mmHg. IAS/Shunts: No atrial level shunt detected by color flow Doppler.  LEFT VENTRICLE PLAX 2D LV EF:         Left ventricular ejection fraction by PLAX is 60 %. LVIDd:         4.72 cm LVIDs:         3.21 cm LV PW:         1.14 cm LV IVS:        1.43 cm LVOT diam:     1.80 cm LVOT Area:     2.54 cm  RIGHT VENTRICLE             IVC RV S prime:     12.40 cm/s  IVC diam: 2.89 cm TAPSE (M-mode): 2.1 cm LEFT ATRIUM            Index       RIGHT ATRIUM           Index LA diam:      5.10 cm  2.73 cm/m  RA Area:     24.50 cm LA Vol (A2C): 82.4 ml  44.15 ml/m RA Volume:   76.00 ml  40.72 ml/m LA Vol (A4C): 111.0 ml 59.47 ml/m   AORTA Ao Root diam: 3.70 cm MR Peak grad: 125.4 mmHg   TRICUSPID VALVE MR Mean grad: 86.0 mmHg   TR Peak grad:   50.7 mmHg MR Vmax:      560.00 cm/s TR Vmax:        356.00 cm/s MR Vmean:     442.0 cm/s                           SHUNTS                           Systemic Diam: 1.80 cm Kate Sable MD Electronically signed by Kate Sable MD Signature Date/Time: 11/05/2020/2:43:14 PM    Final      Assessment/Plan 1. Complication from renal dialysis device, sequela Recommend:  The patient is doing  well and currently has adequate dialysis access. The patient's dialysis center is not reporting any access issues. Flow pattern is stable when compared to the prior ultrasound.  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  The patient should have a duplex ultrasound of the dialysis access in 6 months. The patient will follow-up with me in the office after each ultrasound   - VAS [place] (AVF, AVG); Future  2. ESRD on dialysis [facility]) At the present time the patient has adequate dialysis access.  Continue hemodialysis as ordered without interruption.  Avoid nephrotoxic medications and dehydration.  Further plans per nephrology  3. Benign essential HTN Continue antihypertensive medications as already ordered, these medications have been reviewed and there are no changes at this time.   4. Diastolic dysfunction with chronic heart failure (HCC) Continue cardiac and antihypertensive medications as already ordered and reviewed, no changes at this time.  Continue statin as ordered and reviewed, no changes at this time  Nitrates PRN for chest pain   5. Chronic obstructive pulmonary disease, unspecified COPD type ([place]) Continue pulmonary medications and aerosols as already ordered, these medications have been reviewed and there are no changes at this time.     Hortencia Pilar, MD  12/01/2020 1:47 PM

## 2020-12-05 ENCOUNTER — Other Ambulatory Visit: Payer: Self-pay | Admitting: Nephrology

## 2020-12-05 DIAGNOSIS — R188 Other ascites: Secondary | ICD-10-CM

## 2020-12-06 ENCOUNTER — Other Ambulatory Visit: Payer: Self-pay

## 2020-12-06 ENCOUNTER — Ambulatory Visit
Admission: RE | Admit: 2020-12-06 | Discharge: 2020-12-06 | Disposition: A | Discharge status: Home / Self Care | Payer: Medicare Other | Source: Ambulatory Visit | Attending: Nephrology | Admitting: Nephrology

## 2020-12-06 DIAGNOSIS — R188 Other ascites: Secondary | ICD-10-CM | POA: Diagnosis present

## 2020-12-06 NOTE — Procedures (Signed)
Ultrasound-guided  therapeutic paracentesis performed yielding 2.1 liters of yellow fluid. No immediate complications. EBL none.

## 2020-12-10 ENCOUNTER — Encounter (INDEPENDENT_AMBULATORY_CARE_PROVIDER_SITE_OTHER): Payer: Self-pay | Admitting: Vascular Surgery

## 2021-01-03 ENCOUNTER — Ambulatory Visit: Payer: Medicare Other

## 2021-01-04 ENCOUNTER — Other Ambulatory Visit: Payer: Self-pay | Admitting: Nephrology

## 2021-01-04 DIAGNOSIS — R188 Other ascites: Secondary | ICD-10-CM

## 2021-01-05 ENCOUNTER — Ambulatory Visit
Admission: RE | Admit: 2021-01-05 | Discharge: 2021-01-05 | Disposition: A | Discharge status: Home / Self Care | Payer: Medicare Other | Source: Ambulatory Visit | Attending: Nephrology | Admitting: Nephrology

## 2021-01-05 ENCOUNTER — Other Ambulatory Visit: Payer: Self-pay

## 2021-01-05 DIAGNOSIS — R188 Other ascites: Secondary | ICD-10-CM | POA: Insufficient documentation

## 2021-01-05 NOTE — Procedures (Signed)
Interventional Radiology Procedure Note  Procedure: [place] para    Complications: None  Estimated Blood Loss:  min  Findings: Full report in pacs    Tamera Punt, MD

## 2021-01-10 ENCOUNTER — Other Ambulatory Visit: Payer: Self-pay

## 2021-01-10 ENCOUNTER — Ambulatory Visit
Admission: RE | Admit: 2021-01-10 | Discharge: 2021-01-10 | Disposition: A | Discharge status: Home / Self Care | Payer: Medicare Other | Source: Ambulatory Visit | Attending: Family Medicine | Admitting: Family Medicine

## 2021-01-10 DIAGNOSIS — Z1231 Encounter for screening mammogram for malignant neoplasm of breast: Secondary | ICD-10-CM | POA: Insufficient documentation

## 2021-01-11 ENCOUNTER — Other Ambulatory Visit: Payer: Self-pay | Admitting: *Deleted

## 2021-01-11 ENCOUNTER — Inpatient Hospital Stay
Admission: RE | Admit: 2021-01-11 | Discharge: 2021-01-11 | Disposition: A | Discharge status: Home / Self Care | Payer: Self-pay | Source: Ambulatory Visit | Attending: *Deleted | Admitting: *Deleted

## 2021-01-11 DIAGNOSIS — Z1231 Encounter for screening mammogram for malignant neoplasm of breast: Secondary | ICD-10-CM

## 2021-01-24 ENCOUNTER — Other Ambulatory Visit: Payer: Self-pay

## 2021-01-24 ENCOUNTER — Ambulatory Visit (INDEPENDENT_AMBULATORY_CARE_PROVIDER_SITE_OTHER): Payer: Medicare Other | Admitting: Gastroenterology

## 2021-01-24 ENCOUNTER — Encounter: Payer: Self-pay | Admitting: Gastroenterology

## 2021-01-24 VITALS — BP 114/74 | HR 78 | Temp 98.1°F | Ht 66.0 in | Wt 187.5 lb

## 2021-01-24 DIAGNOSIS — K5904 Chronic idiopathic constipation: Secondary | ICD-10-CM

## 2021-01-24 DIAGNOSIS — K641 Second degree hemorrhoids: Secondary | ICD-10-CM | POA: Diagnosis not present

## 2021-01-24 DIAGNOSIS — K625 Hemorrhage of anus and rectum: Secondary | ICD-10-CM | POA: Diagnosis not present

## 2021-01-24 NOTE — Progress Notes (Signed)
Cephas Darby, MD [address]  [place]  [place], [place] [address]  Main: [phone]  Fax: [phone]    Gastroenterology Consultation  Referring Provider:     Remi Haggard, FNP Primary Care Physician:  Remi Haggard, FNP Primary Gastroenterologist:  Dr. Cephas Darby Reason for Consultation:   Symptomatic hemorrhoids, rectal bleeding and constipation       HPI:   Janet Olson is a 51 y.o. female referred by Dr. Dewitt Hoes for consultation & management of ascites, fatty liver.  Patient has end-stage renal disease, currently on hemodialysis who has been noticing several months of gradually worsening abdominal distention, puffiness of her eyes, shortness of breath, swelling of legs.  She underwent ultrasound of her abdomen by Dr. Stefani Dama and was found to have moderate ascites as well as fatty liver, patent portal vein, no splenomegaly.  Therefore, she is referred to GI for further evaluation.  Patient reports that she still makes urine and currently taking low-dose furosemide in addition to undergoing hemodialysis.  She does report severe constipation associated with scant amount of bright red blood per rectum.  She has Linzess 145 MCG samples which she just started taking.  Her main concern today is abdominal discomfort from distention that has resulted in decreased p.o. intake, unable to lay flat and lower abdominal pain.  She does acknowledge eating chips regularly.  She does smoke cigarettes regularly.  She denies fever, chills, nausea or vomiting, melena.  Due to ongoing discomfort, patient went to [facility] [place] on 1/3.  She underwent CBC which was unremarkable, hemoglobin 12, platelets 232, WBC 6.4, LFTs revealed albumin 4, AST 25, ALT 22, alkaline phosphatase 216.  Her BNP was significantly elevated she underwent CT abdomen and pelvis without contrast which revealed diffuse body wall edema and large volume ascites as well as mild umbilical hernia.  Chest  x-ray revealed cardiomegaly and diffuse bilateral interstitial infiltrates.  She does not have a cardiologist.  CT did not reveal cirrhosis or splenomegaly or portal hypertension  Echocardiogram in 04/2017 revealed EF of 55 to 81%, grade 1 diastolic dysfunction, mild pericardial effusion  Follow-up visit 01/24/2021 Patient made an appointment to see me for intermittent bright red blood per rectum associated with rectal pain, discomfort, itching as well as prolapse that has been ongoing for few months.  Patient also has chronic intermittent constipation for which she tried Linzess prescribed by her PCP.  She failed Linzess helped but it resulted in severe diarrhea, therefore stopped taking it.  She is interested in hemorrhoid ligation.  Patient reported that she tried several different over-the-counter medications and has spent a lot of money on it however her hemorrhoidal symptoms are still persistent.  She has paracentesis once about a month for her ascites.  Patient has been started on Creon by her PCP for abdominal bloating, patient is not sure if it is helping.  No known evidence of EPI or chronic pancreatitis  NSAIDs: none  Antiplts/Anticoagulants/Anti thrombotics: none  GI Procedures: Colonoscopy for rectal bleeding, 04/24/2018 - The examined portion of the ileum was normal. - Three diminutive polyps in the rectum and in the cecum, removed with a cold biopsy forceps. Resected and retrieved. - Moderate diverticulosis in the sigmoid colon. There was no evidence of diverticular bleeding. - Internal hemorrhoids. DIAGNOSIS:  A. COLON POLYP, CECUM; COLD BIOPSY:  - COLONIC MUCOSA WITH PROMINENT LYMPHOID AGGREGATE.  - NEGATIVE FOR DYSPLASIA AND MALIGNANCY.   B. RECTUM POLYP; COLD BIOPSY:  -  EARLY HYPERPLASTIC POLYP.  - NEGATIVE FOR DYSPLASIA AND MALIGNANCY.   She denies family history of GI malignancy in first-degree or second-degree relatives She did not have any GI surgeries  Past  Medical History:  Diagnosis Date  . (HFpEF) heart failure with preserved ejection fraction ([place])    a. 2015 Echo: Nl EF. Mild to mod LVH. Mild PR/MR; b. 04/2017 Echo: EF 55-60%, no rwma, Gr1 DD. Mildly dil LA. Small effusion; c. 12/2018 Echo: EF 55-60%, mildly dil LA/PA. Mod dil RA. Mild to mod RV dil. Mildly reduced RV fxn. Mild TR. Mod elev PASP.  Marland Kitchen Abdominal ascites   . Acute hypoxemic respiratory failure ([place]) 01/03/2019  . Acute renal failure (ARF) ([place]) 05/17/2017  . Acute respiratory failure ([place]) 09/25/2018  . Anemia   . Anxiety   . Arthritis   . Asthma   . Cardiomegaly   . COPD (chronic obstructive pulmonary disease) ([place])   . Depression   . Dialysis patient ([place])    MON, WED , FRI  . Dyspnea   . ESRD (end stage renal disease) ([place])   . GERD (gastroesophageal reflux disease)   . Headache    Migraines  . Hypertension   . Nonrheumatic tricuspid (valve) insufficiency   . Respiratory failure with hypoxia ([place]) 04/21/2018  . Ulcer    gastric  . Vitamin D deficiency     Past Surgical History:  Procedure Laterality Date  . A/V FISTULAGRAM Left 04/17/2018   Procedure: A/V FISTULAGRAM;  Surgeon: Algernon Huxley, MD;  Location: [facility];  Service: Cardiovascular;  Laterality: Left;  . A/V FISTULAGRAM Left 05/27/2018   Procedure: A/V FISTULAGRAM;  Surgeon: Katha Cabal, MD;  Location: [facility];  Service: Cardiovascular;  Laterality: Left;  . A/V FISTULAGRAM Left 07/05/2020   Procedure: A/V FISTULAGRAM;  Surgeon: Katha Cabal, MD;  Location: [facility];  Service: Cardiovascular;  Laterality: Left;  . AV FISTULA PLACEMENT Left 08/08/2017   Procedure: ARTERIOVENOUS (AV) FISTULA CREATION ( BRACHIOCEPHALIC );  Surgeon: Algernon Huxley, MD;  Location: [facility];  Service: Vascular;  Laterality: Left;  . AV FISTULA PLACEMENT Left 08/05/2020   Procedure: INSERTION OF ARTERIOVENOUS (AV) GRAFT ARM(BRACHIAL AXILLARY);  Surgeon: Katha Cabal, MD;  Location:  [facility];  Service: Vascular;  Laterality: Left;  . CESAREAN SECTION     x 4 (1988, 1991, 1997, 2000 )  . COLONOSCOPY WITH PROPOFOL N/A 04/24/2018   Procedure: COLONOSCOPY WITH PROPOFOL;  Surgeon: Lin Landsman, MD;  Location: [facility];  Service: Gastroenterology;  Laterality: N/A;  . DIALYSIS/PERMA CATHETER INSERTION N/A 07/16/2017   Procedure: Dialysis/Perma Catheter Insertion;  Surgeon: Katha Cabal, MD;  Location: [facility];  Service: Cardiovascular;  Laterality: N/A;  . DIALYSIS/PERMA CATHETER REMOVAL N/A 10/29/2017   Procedure: DIALYSIS/PERMA CATHETER REMOVAL;  Surgeon: Katha Cabal, MD;  Location: [facility];  Service: Cardiovascular;  Laterality: N/A;  . DIALYSIS/PERMA CATHETER REMOVAL N/A 11/03/2020   Procedure: DIALYSIS/PERMA CATHETER REMOVAL;  Surgeon: Algernon Huxley, MD;  Location: [facility];  Service: Cardiovascular;  Laterality: N/A;  . excision of lymph nodes Bilateral 2014   bilateral under arms.  Marland Kitchen PARATHYROID IMPLANT REMOVAL    . REMOVAL OF A DIALYSIS CATHETER Left 08/05/2020   Procedure: REMOVAL OF A DIALYSIS CATHETER ( FISTULA );  Surgeon: Katha Cabal, MD;  Location: [facility];  Service: Vascular;  Laterality: Left;     Current Outpatient Medications:  .  albuterol (PROVENTIL) (2.5 MG/3ML) 0.083% nebulizer solution, Take 2.5 mg by nebulization every 6 (six) hours as needed for wheezing or shortness of breath. , Disp: , Rfl:  .  albuterol (VENTOLIN HFA) 108 (90 Base) MCG/ACT inhaler, Inhale 2 puffs into the lungs every 6 (six) hours as needed for wheezing or shortness of breath. , Disp: , Rfl:  .  amLODipine (NORVASC) 10 MG tablet, Take 1 tablet (10 mg total) by mouth daily., Disp: 30 tablet, Rfl: 3 .  benzonatate (TESSALON) 100 MG capsule, Take 100 mg by mouth 2 (two) times daily as needed for cough. , Disp: , Rfl:  .  calcium acetate (PHOSLO) 667 MG capsule, Take 2 capsules (1,334 mg total) by mouth 3 (three) times  daily with meals., Disp: 180 capsule, Rfl: 0 .  chlorpheniramine-HYDROcodone (TUSSIONEX) 10-8 MG/5ML SUER, Take 5 mLs by mouth daily as needed for cough. , Disp: , Rfl:  .  Cholecalciferol (VITAMIN D3) 125 MCG (5000 UT) CAPS, Take 5,000 Units by mouth once a week., Disp: , Rfl:  .  clotrimazole (MYCELEX) 10 MG troche, Take by mouth., Disp: , Rfl:  .  CREON 36000-114000 units CPEP capsule, Take by mouth., Disp: , Rfl:  .  docusate sodium (COLACE) 100 MG capsule, Take 100 mg by mouth daily as needed for moderate constipation. , Disp: , Rfl:  .  DULERA 200-5 MCG/ACT AERO, Inhale 2 puffs into the lungs 2 (two) times daily., Disp: , Rfl: 0 .  DULoxetine (CYMBALTA) 60 MG capsule, Take 60 mg by mouth 2 (two) times daily., Disp: , Rfl:  .  hydrOXYzine (ATARAX/VISTARIL) 25 MG tablet, Take 25 mg by mouth every 8 (eight) hours as needed for itching. , Disp: , Rfl:  .  lidocaine-prilocaine (EMLA) cream, , Disp: , Rfl:  .  metoprolol tartrate (LOPRESSOR) 50 MG tablet, Take 50 mg by mouth 2 (two) times daily., Disp: , Rfl:  .  multivitamin (RENA-VIT) TABS tablet, Take 1 tablet by mouth daily. , Disp: , Rfl: 1 .  neomycin-polymyxin-hydrocortisone (CORTISPORIN) 3.5-10000-1 OTIC suspension, PLACE 4 DROPS IN AFFECTED EARS THREE TIMES DAILY UNTIL ITCHING RELIEVED, Disp: , Rfl:  .  oxyCODONE (OXY IR/ROXICODONE) 5 MG immediate release tablet, Take 1 tablet (5 mg total) by mouth every 6 (six) hours as needed for moderate pain or severe pain., Disp: 30 tablet, Rfl: 0 .  oxyCODONE-acetaminophen (PERCOCET) 5-325 MG tablet, Take 1-2 tablets by mouth every 6 (six) hours as needed for moderate pain or severe pain., Disp: 36 tablet, Rfl: 0 .  pantoprazole (PROTONIX) 40 MG tablet, Take 1 tablet (40 mg total) by mouth daily., Disp: 30 tablet, Rfl: 0 .  propranolol (INDERAL) 40 MG tablet, Take 40 mg by mouth daily., Disp: , Rfl:  .  QUEtiapine (SEROQUEL) 25 MG tablet, Take 25 mg by mouth at bedtime as needed (sleep). , Disp: ,  Rfl:  .  torsemide (DEMADEX) 100 MG tablet, Take 1 tablet (100 mg total) by mouth daily., Disp: 30 tablet, Rfl: 0 .  Vitamin D, Ergocalciferol, (DRISDOL) 50000 units CAPS capsule, Take 50,000 Units by mouth every Sunday. , Disp: , Rfl: 0   Family History  Problem Relation Age of Onset  . Diabetes Mother      Social History   Tobacco Use  . Smoking status: Current Every Day Smoker    Packs/day: 0.50    Years: 30.00    Pack years: 15.00    Types: Cigarettes  . Smokeless tobacco: Never Used  Vaping Use  .  Vaping Use: Never used  Substance Use Topics  . Alcohol use: No  . Drug use: No    Allergies as of 01/24/2021  . (No Known Allergies)    Review of Systems:    All systems reviewed and negative except where noted in HPI.   Physical Exam:  BP 114/74 (BP Location: Left Arm, Patient Position: Sitting, Cuff Size: Normal)   Pulse 78   Temp 98.1 F (36.7 C) (Oral)   Ht 5\' 6"  (1.676 m)   Wt 187 lb 8 oz (85 kg)   LMP 06/10/2018 (Approximate)   BMI 30.26 kg/m  Patient's last menstrual period was 06/10/2018 (approximate).  General:   Alert,  Well-developed, well-nourished, pleasant and cooperative in NAD Head:  Normocephalic and atraumatic. Eyes:  Sclera clear, no icterus.   Conjunctiva pink. Ears:  Normal auditory acuity. Nose:  No deformity, discharge, or lesions. Mouth:  No deformity or lesions,oropharynx pink & moist. Neck:  Supple; no masses or thyromegaly. Lungs:  Respirations even and unlabored.  Clear throughout to auscultation.   No wheezes, crackles, or rhonchi. No acute distress. Heart:  Regular rate and rhythm; no murmurs, clicks, rubs, or gallops. Abdomen:  Normal bowel sounds. Soft, distended, with nontender umbilical hernia present, non-tender without masses, hepatosplenomegaly noted.  No guarding or rebound tenderness.   Rectal: Normal perianal exam, nontender digital rectal exam, anoscopy revealed prolapsed external hemorrhoids, with stigmata of recent  bleeding Msk:  Symmetrical without gross deformities. Good, equal movement & strength bilaterally. Pulses:  Normal pulses noted. Extremities:  No clubbing, 1+ edema.  No cyanosis, left upper arm AV fistula. Neurologic:  Alert and oriented x3;  grossly normal neurologically. Skin:  Intact without significant lesions or rashes. No jaundice. Psych:  Alert and cooperative. Normal mood and affect.  Imaging Studies: reviewed  Assessment and Plan:   Janet Olson is a 51 y.o. African-American female with end stage renal disease secondary to hypertension on hemodialysis, ascites secondary to ESRD and CHF is seen in consultation for symptomatic hemorrhoids, constipation  Symptomatic, grade 2 hemorrhoids Discussed about hemorrhoid ligation including risks and benefits, consent obtained Proceed with hemorrhoid ligation today  Chronic constipation Discussed about high-fiber diet, fiber supplements, adequate intake of water Trial of Linzess 145 MCG daily, samples provided  Normocytic anemia Recommend to check iron panel and serum folate levels during next visit Normal B12 levels   Follow up in 2 weeks   Cephas Darby, MD

## 2021-01-24 NOTE — Progress Notes (Signed)

## 2021-01-24 NOTE — Patient Instructions (Signed)
High-Fiber Eating Plan Fiber, also called dietary fiber, is a type of carbohydrate. It is found foods such as fruits, vegetables, whole grains, and beans. A high-fiber diet can have many health benefits. Your health care provider may recommend a high-fiber diet to help:  Prevent constipation. Fiber can make your bowel movements more regular.  Lower your cholesterol.  Relieve the following conditions: ? Inflammation of veins in the anus (hemorrhoids). ? Inflammation of specific areas of the digestive tract (uncomplicated diverticulosis). ? A problem of the large intestine, also called the colon, that sometimes causes pain and diarrhea (irritable bowel syndrome, or IBS).  Prevent overeating as part of a weight-loss plan.  Prevent heart disease, type 2 diabetes, and certain cancers. What are tips for following this plan? Reading food labels  Check the nutrition facts label on food products for the amount of dietary fiber. Choose foods that have 5 grams of fiber or more per serving.  The goals for recommended daily fiber intake include: ? Men (age 50 or younger): 34-38 g. ? Men (over age 50): 28-34 g. ? Women (age 50 or younger): 25-28 g. ? Women (over age 50): 22-25 g. Your daily fiber goal is _____________ g.   Shopping  Choose whole fruits and vegetables instead of processed forms, such as apple juice or applesauce.  Choose a wide variety of high-fiber foods such as avocados, lentils, oats, and kidney beans.  Read the nutrition facts label of the foods you choose. Be aware of foods with added fiber. These foods often have high sugar and sodium amounts per serving. Cooking  Use whole-grain flour for baking and cooking.  Cook with brown rice instead of white rice. Meal planning  Start the day with a breakfast that is high in fiber, such as a cereal that contains 5 g of fiber or more per serving.  Eat breads and cereals that are made with whole-grain flour instead of refined  flour or white flour.  Eat brown rice, bulgur wheat, or millet instead of white rice.  Use beans in place of meat in soups, salads, and pasta dishes.  Be sure that half of the grains you eat each day are whole grains. General information  You can get the recommended daily intake of dietary fiber by: ? Eating a variety of fruits, vegetables, grains, nuts, and beans. ? Taking a fiber supplement if you are not able to take in enough fiber in your diet. It is better to get fiber through food than from a supplement.  Gradually increase how much fiber you consume. If you increase your intake of dietary fiber too quickly, you may have bloating, cramping, or gas.  Drink plenty of water to help you digest fiber.  Choose high-fiber snacks, such as berries, raw vegetables, nuts, and popcorn. What foods should I eat? Fruits Berries. Pears. Apples. Oranges. Avocado. Prunes and raisins. Dried figs. Vegetables Sweet potatoes. Spinach. Kale. Artichokes. Cabbage. Broccoli. Cauliflower. Green peas. Carrots. Squash. Grains Whole-grain breads. Multigrain cereal. Oats and oatmeal. Brown rice. Barley. Bulgur wheat. Millet. Quinoa. Bran muffins. Popcorn. Rye wafer crackers. Meats and other proteins Navy beans, kidney beans, and pinto beans. Soybeans. Split peas. Lentils. Nuts and seeds. Dairy Fiber-fortified yogurt. Beverages Fiber-fortified soy milk. Fiber-fortified orange juice. Other foods Fiber bars. The items listed above may not be a complete list of recommended foods and beverages. Contact a dietitian for more information. What foods should I avoid? Fruits Fruit juice. Cooked, strained fruit. Vegetables Fried potatoes. Canned vegetables. Well-cooked vegetables. Grains   White bread. Pasta made with refined flour. White rice. Meats and other proteins Fatty cuts of meat. Fried chicken or fried fish. Dairy Milk. Yogurt. Cream cheese. Sour cream. Fats and oils Butters. Beverages Soft  drinks. Other foods Cakes and pastries. The items listed above may not be a complete list of foods and beverages to avoid. Talk with your dietitian about what choices are best for you. Summary  Fiber is a type of carbohydrate. It is found in foods such as fruits, vegetables, whole grains, and beans.  A high-fiber diet has many benefits. It can help to prevent constipation, lower blood cholesterol, aid weight loss, and reduce your risk of heart disease, diabetes, and certain cancers.  Increase your intake of fiber gradually. Increasing fiber too quickly may cause cramping, bloating, and gas. Drink plenty of water while you increase the amount of fiber you consume.  The best sources of fiber include whole fruits and vegetables, whole grains, nuts, seeds, and beans. This information is not intended to replace advice given to you by your health care provider. Make sure you discuss any questions you have with your health care provider. Document Revised: 04/21/2020 Document Reviewed: 04/21/2020 Elsevier Patient Education  2021 Elsevier Inc.  

## 2021-02-06 ENCOUNTER — Other Ambulatory Visit: Payer: Self-pay | Admitting: Nephrology

## 2021-02-06 DIAGNOSIS — R188 Other ascites: Secondary | ICD-10-CM

## 2021-02-07 ENCOUNTER — Ambulatory Visit
Admission: RE | Admit: 2021-02-07 | Discharge: 2021-02-07 | Disposition: A | Discharge status: Home / Self Care | Payer: Medicare Other | Source: Ambulatory Visit | Attending: Nephrology | Admitting: Nephrology

## 2021-02-07 ENCOUNTER — Ambulatory Visit (INDEPENDENT_AMBULATORY_CARE_PROVIDER_SITE_OTHER): Payer: Medicare Other | Admitting: Gastroenterology

## 2021-02-07 ENCOUNTER — Encounter: Payer: Self-pay | Admitting: Gastroenterology

## 2021-02-07 ENCOUNTER — Other Ambulatory Visit: Payer: Self-pay

## 2021-02-07 VITALS — BP 138/73 | HR 76 | Temp 97.3°F | Ht 66.0 in | Wt 184.4 lb

## 2021-02-07 DIAGNOSIS — E538 Deficiency of other specified B group vitamins: Secondary | ICD-10-CM

## 2021-02-07 DIAGNOSIS — R188 Other ascites: Secondary | ICD-10-CM | POA: Insufficient documentation

## 2021-02-07 DIAGNOSIS — D638 Anemia in other chronic diseases classified elsewhere: Secondary | ICD-10-CM | POA: Diagnosis not present

## 2021-02-07 NOTE — Procedures (Signed)
PROCEDURE SUMMARY:  Successful US guided paracentesis from right abdomen.  Yielded 3.8 L of clear yellow fluid.  No immediate complications.  Pt tolerated well.   EBL < 2 mL  Theresa Duty, NP 02/07/2021 3:36 PM

## 2021-02-07 NOTE — Progress Notes (Signed)
PROCEDURE NOTE: The patient presents with symptomatic grade 2 hemorrhoids, unresponsive to maximal medical therapy, requesting rubber band ligation of his/her hemorrhoidal disease.  All risks, benefits and alternative forms of therapy were described and informed consent was obtained.   The decision was made to band the RA internal hemorrhoid, and the [place] was used to perform band ligation without complication.  Digital anorectal examination was then performed to assure proper positioning of the band, and to adjust the banded tissue as required.  The patient was discharged home without pain or other issues.  Dietary and behavioral recommendations were given and (if necessary - prescriptions were given), along with follow-up instructions.  The patient will return 2 weeks for follow-up and possible additional banding as required.  No complications were encountered and the patient tolerated the procedure well.  Patient reports that her constipation improved with fiber itself, she did not need to use linzess samples  We will check iron panel and folate levels for history of anemia  Cephas Darby, MD [address]  [place], [place] [address]  Main: [phone]  Fax: [phone] Pager: [phone]

## 2021-02-08 LAB — IRON,TIBC AND FERRITIN PANEL
Ferritin: 103 ng/mL (ref 15–150)
Iron Saturation: 7 % — CL (ref 15–55)
Iron: 30 ug/dL (ref 27–159)
Total Iron Binding Capacity: 426 ug/dL (ref 250–450)
UIBC: 396 ug/dL (ref 131–425)

## 2021-02-08 LAB — FOLATE: Folate: >20 ng/mL (ref >3.0)

## 2021-02-28 ENCOUNTER — Encounter: Payer: Self-pay | Admitting: Gastroenterology

## 2021-02-28 ENCOUNTER — Ambulatory Visit (INDEPENDENT_AMBULATORY_CARE_PROVIDER_SITE_OTHER): Payer: Medicare Other | Admitting: Gastroenterology

## 2021-02-28 ENCOUNTER — Other Ambulatory Visit: Payer: Self-pay

## 2021-02-28 VITALS — BP 128/69 | HR 77 | Temp 98.4°F | Ht 66.0 in | Wt 182.0 lb

## 2021-02-28 DIAGNOSIS — K641 Second degree hemorrhoids: Secondary | ICD-10-CM | POA: Diagnosis not present

## 2021-02-28 DIAGNOSIS — K625 Hemorrhage of anus and rectum: Secondary | ICD-10-CM

## 2021-02-28 NOTE — Progress Notes (Signed)
PROCEDURE NOTE: The patient presents with symptomatic grade 2 hemorrhoids, unresponsive to maximal medical therapy, requesting rubber band ligation of his/her hemorrhoidal disease.  All risks, benefits and alternative forms of therapy were described and informed consent was obtained.  The decision was made to band the LL internal hemorrhoid, and the [place] was used to perform band ligation without complication.  Digital anorectal examination was then performed to assure proper positioning of the band, and to adjust the banded tissue as required.  The patient was discharged home without pain or other issues.  Dietary and behavioral recommendations were given and (if necessary - prescriptions were given), along with follow-up instructions.  The patient will return as needed for follow-up and possible additional banding as required.  No complications were encountered and the patient tolerated the procedure well.   Cephas Darby, MD [address]  [place]  [place], [place] [address]  Main: [phone]  Fax: [phone] Pager: [phone]

## 2021-03-23 ENCOUNTER — Encounter (INDEPENDENT_AMBULATORY_CARE_PROVIDER_SITE_OTHER): Payer: Medicare Other

## 2021-03-23 ENCOUNTER — Ambulatory Visit (INDEPENDENT_AMBULATORY_CARE_PROVIDER_SITE_OTHER): Payer: Medicare Other | Admitting: Vascular Surgery

## 2021-03-29 ENCOUNTER — Other Ambulatory Visit: Payer: Self-pay | Admitting: Nephrology

## 2021-03-29 DIAGNOSIS — R188 Other ascites: Secondary | ICD-10-CM

## 2021-03-30 ENCOUNTER — Other Ambulatory Visit: Payer: Self-pay

## 2021-03-30 ENCOUNTER — Ambulatory Visit
Admission: RE | Admit: 2021-03-30 | Discharge: 2021-03-30 | Disposition: A | Discharge status: Home / Self Care | Payer: Medicare Other | Source: Ambulatory Visit | Attending: Nephrology | Admitting: Nephrology

## 2021-03-30 ENCOUNTER — Other Ambulatory Visit: Payer: Self-pay | Admitting: Nephrology

## 2021-03-30 DIAGNOSIS — R188 Other ascites: Secondary | ICD-10-CM

## 2021-05-03 ENCOUNTER — Other Ambulatory Visit (INDEPENDENT_AMBULATORY_CARE_PROVIDER_SITE_OTHER): Payer: Self-pay | Admitting: Vascular Surgery

## 2021-05-03 DIAGNOSIS — T829XXS Unspecified complication of cardiac and vascular prosthetic device, implant and graft, sequela: Secondary | ICD-10-CM

## 2021-05-03 DIAGNOSIS — N186 End stage renal disease: Secondary | ICD-10-CM

## 2021-05-04 ENCOUNTER — Ambulatory Visit (INDEPENDENT_AMBULATORY_CARE_PROVIDER_SITE_OTHER): Payer: Medicare Other | Admitting: Vascular Surgery

## 2021-05-04 ENCOUNTER — Encounter (INDEPENDENT_AMBULATORY_CARE_PROVIDER_SITE_OTHER): Payer: Medicare Other

## 2021-05-04 ENCOUNTER — Encounter (INDEPENDENT_AMBULATORY_CARE_PROVIDER_SITE_OTHER): Payer: Self-pay | Admitting: Vascular Surgery

## 2021-05-04 ENCOUNTER — Encounter (INDEPENDENT_AMBULATORY_CARE_PROVIDER_SITE_OTHER): Payer: Self-pay

## 2021-05-05 NOTE — Progress Notes (Deleted)
Cardiology Office Note  Date:  05/05/2021   ID:  Janet Olson, DOB 05-May-1970, MRN [id number]  PCP:  Remi Haggard, FNP   No chief complaint on file.   HPI:  [name] is a 51 year old woman with past medical history of ESRD On HD COPD HTN/renal dz Anemia of chronic dz Asthma Smoker Diabetes  mon/wed/Fri Who presents for f/u of her abnormal echocardiogram, pulmopnary edema    Could not tolerate coreg, upset stomach Reports having episodes of constipation Tried linzess, works well  She is still bothered by ascites High fluid intake, takes torsemide daily  hospitalized 1/21, records reviewed ascites requiring paracentesis with removal of 2 L fluid.  Patient reports that she has missed several recent dialysis (3 in total) as she was feeling sick.  Hospital 02/2020, records reviewed Secondary to volume overload after missing several recent dialysis sessions. paracentesis with 7.5 L clear ascites fluid removed  Echo 02/2020 results reviewed with her 1. Left ventricular ejection fraction, by estimation, is 50 to 55%. The  left ventricle has low normal function. The left ventricle has no regional  wall motion abnormalities. There is moderate left ventricular hypertrophy.  -- interventricular septum is  flattened in diastole ('D' shaped left ventricle), consistent with right  ventricular volume overload.  --Right ventricular systolic function is moderately reduced. The right  ventricular size is mildly enlarged. There is moderately elevated  pulmonary artery systolic pressure.  --Acites is present.. The pericardial effusion is posterior to the left  ventricle.   Moderately dilated pulmonary artery.   The inferior vena cava is dilated  right atrial pressure of 15 mmHg.   Lab work reviewed hematocrit 33.9  EKG personally reviewed by myself on todays visit Shows normal sinus rhythm rate 87 bpm no significant ST or T wave changes    PMH:   has a past medical  history of (HFpEF) heart failure with preserved ejection fraction ([place]), Abdominal ascites, Acute hypoxemic respiratory failure ([place]) (01/03/2019), Acute renal failure (ARF) ([place]) (05/17/2017), Acute respiratory failure ([place]) (09/25/2018), Anemia, Anxiety, Arthritis, Asthma, Cardiomegaly, COPD (chronic obstructive pulmonary disease) ([place]), Depression, Dialysis patient ([place]), Dyspnea, ESRD (end stage renal disease) ([place]), GERD (gastroesophageal reflux disease), Headache, Hypertension, Nonrheumatic tricuspid (valve) insufficiency, Respiratory failure with hypoxia ([place]) (04/21/2018), Ulcer, and Vitamin D deficiency.  PSH:    Past Surgical History:  Procedure Laterality Date  . A/V FISTULAGRAM Left 04/17/2018   Procedure: A/V FISTULAGRAM;  Surgeon: Algernon Huxley, MD;  Location: [place] [facility];  Service: Cardiovascular;  Laterality: Left;  . A/V FISTULAGRAM Left 05/27/2018   Procedure: A/V FISTULAGRAM;  Surgeon: Katha Cabal, MD;  Location: [place] [facility];  Service: Cardiovascular;  Laterality: Left;  . A/V FISTULAGRAM Left 07/05/2020   Procedure: A/V FISTULAGRAM;  Surgeon: Katha Cabal, MD;  Location: [place] [facility];  Service: Cardiovascular;  Laterality: Left;  . AV FISTULA PLACEMENT Left 08/08/2017   Procedure: ARTERIOVENOUS (AV) FISTULA CREATION ( BRACHIOCEPHALIC );  Surgeon: Algernon Huxley, MD;  Location: [facility];  Service: Vascular;  Laterality: Left;  . AV FISTULA PLACEMENT Left 08/05/2020   Procedure: INSERTION OF ARTERIOVENOUS (AV) GRAFT ARM(BRACHIAL AXILLARY);  Surgeon: Katha Cabal, MD;  Location: [facility];  Service: Vascular;  Laterality: Left;  . CESAREAN SECTION     x 4 (1988, 1991, 1997, 2000 )  . COLONOSCOPY WITH PROPOFOL N/A 04/24/2018   Procedure: COLONOSCOPY WITH PROPOFOL;  Surgeon: Lin Landsman, MD;  Location: [facility];  Service: Gastroenterology;  Laterality: N/A;  . DIALYSIS/PERMA CATHETER INSERTION N/A 07/16/2017   Procedure: Dialysis/Perma  Catheter Insertion;  Surgeon: Katha Cabal, MD;  Location: [place] [facility];  Service: Cardiovascular;  Laterality: N/A;  . DIALYSIS/PERMA CATHETER REMOVAL N/A 10/29/2017   Procedure: DIALYSIS/PERMA CATHETER REMOVAL;  Surgeon: Katha Cabal, MD;  Location: [place] [facility];  Service: Cardiovascular;  Laterality: N/A;  . DIALYSIS/PERMA CATHETER REMOVAL N/A 11/03/2020   Procedure: DIALYSIS/PERMA CATHETER REMOVAL;  Surgeon: Algernon Huxley, MD;  Location: [place] [facility];  Service: Cardiovascular;  Laterality: N/A;  . excision of lymph nodes Bilateral 2014   bilateral under arms.  Marland Kitchen PARATHYROID IMPLANT REMOVAL    . REMOVAL OF A DIALYSIS CATHETER Left 08/05/2020   Procedure: REMOVAL OF A DIALYSIS CATHETER ( FISTULA );  Surgeon: Katha Cabal, MD;  Location: [facility];  Service: Vascular;  Laterality: Left;    Current Outpatient Medications  Medication Sig Dispense Refill  . albuterol (PROVENTIL) (2.5 MG/3ML) 0.083% nebulizer solution Take 2.5 mg by nebulization every 6 (six) hours as needed for wheezing or shortness of breath.     Marland Kitchen albuterol (VENTOLIN HFA) 108 (90 Base) MCG/ACT inhaler Inhale 2 puffs into the lungs every 6 (six) hours as needed for wheezing or shortness of breath.     Marland Kitchen amLODipine (NORVASC) 10 MG tablet Take 1 tablet (10 mg total) by mouth daily. 30 tablet 3  . benzonatate (TESSALON) 100 MG capsule Take 100 mg by mouth 2 (two) times daily as needed for cough.     . calcium acetate (PHOSLO) 667 MG capsule Take 2 capsules (1,334 mg total) by mouth 3 (three) times daily with meals. 180 capsule 0  . chlorpheniramine-HYDROcodone (TUSSIONEX) 10-8 MG/5ML SUER Take 5 mLs by mouth daily as needed for cough.     . Cholecalciferol (VITAMIN D3) 125 MCG (5000 UT) CAPS Take 5,000 Units by mouth once a week.    . clotrimazole (MYCELEX) 10 MG troche Take by mouth.    . CREON 36000-114000 units CPEP capsule Take by mouth.    . docusate sodium (COLACE) 100 MG capsule Take 100  mg by mouth daily as needed for moderate constipation.     . DULERA 200-5 MCG/ACT AERO Inhale 2 puffs into the lungs 2 (two) times daily.  0  . DULoxetine (CYMBALTA) 30 MG capsule Take 30 mg by mouth 2 (two) times daily.    . hydrOXYzine (ATARAX/VISTARIL) 25 MG tablet Take 25 mg by mouth every 8 (eight) hours as needed for itching.     . lidocaine-prilocaine (EMLA) cream     . metoprolol tartrate (LOPRESSOR) 50 MG tablet Take 50 mg by mouth 2 (two) times daily.    . multivitamin (RENA-VIT) TABS tablet Take 1 tablet by mouth daily.   1  . neomycin-polymyxin-hydrocortisone (CORTISPORIN) 3.5-10000-1 OTIC suspension PLACE 4 DROPS IN AFFECTED EARS THREE TIMES DAILY UNTIL ITCHING RELIEVED    . oxyCODONE (OXY IR/ROXICODONE) 5 MG immediate release tablet Take 1 tablet (5 mg total) by mouth every 6 (six) hours as needed for moderate pain or severe pain. 30 tablet 0  . oxyCODONE-acetaminophen (PERCOCET) 5-325 MG tablet Take 1-2 tablets by mouth every 6 (six) hours as needed for moderate pain or severe pain. 36 tablet 0  . pantoprazole (PROTONIX) 40 MG tablet Take 1 tablet (40 mg total) by mouth daily. 30 tablet 0  . propranolol (INDERAL) 40 MG tablet Take 40 mg by mouth daily.    . QUEtiapine (SEROQUEL) 25 MG tablet  Take 25 mg by mouth at bedtime as needed (sleep).     . torsemide (DEMADEX) 100 MG tablet Take 1 tablet (100 mg total) by mouth daily. 30 tablet 0  . Vitamin D, Ergocalciferol, (DRISDOL) 50000 units CAPS capsule Take 50,000 Units by mouth every Sunday.   0   No current facility-administered medications for this visit.     Allergies:   Patient has no known allergies.   Social History:  The patient  reports that she has been smoking cigarettes. She has a 15.00 pack-year smoking history. She has never used smokeless tobacco. She reports that she does not drink alcohol and does not use drugs.   Family History:   family history includes Diabetes in her mother.    Review of Systems: Review of  Systems  Constitutional: Positive for malaise/fatigue.  Respiratory: Negative.   Cardiovascular: Negative.   Gastrointestinal: Negative.   Musculoskeletal: Negative.   Neurological: Negative.   Psychiatric/Behavioral: Negative.   All other systems reviewed and are negative.    PHYSICAL EXAM: VS:  LMP 06/10/2018 (Approximate)  , BMI There is no height or weight on file to calculate BMI. GEN: Well nourished, well developed, in no acute distress  HEENT: normal  Neck: no JVD, carotid bruits, or masses Cardiac: RRR; no murmurs, rubs, or gallops,no edema  Respiratory:  clear to auscultation bilaterally, normal work of breathing GI: soft, nontender, nondistended, + BS MS: no deformity or atrophy  Skin: warm and dry, no rash Neuro:  Strength and sensation are intact Psych: euthymic mood, full affect    Recent Labs: 11/02/2020: B Natriuretic Peptide >4,500.0; Magnesium 1.6; TSH 1.406 11/05/2020: ALT 78; BUN 32; Creatinine, Ser 5.08; Hemoglobin 8.2; Platelets 137; Potassium 3.8; Sodium 132    Lipid Panel Lab Results  Component Value Date   CHOL 198 05/17/2017   HDL 63 05/17/2017   LDLCALC 96 05/17/2017   TRIG 197 (H) 05/17/2017      Wt Readings from Last 3 Encounters:  02/28/21 182 lb (82.6 kg)  02/07/21 184 lb 6 oz (83.6 kg)  01/24/21 187 lb 8 oz (85 kg)      ASSESSMENT AND PLAN:  ESRD on dialysis [facility]) - Plan: EKG 12-Lead Therapy on Monday Wednesday Friday She does have ascites on exam today, appears mild to moderate Stressed importance of low fluid intake,  Chronic obstructive pulmonary disease, unspecified COPD type ([place]) - Plan: EKG 12-Lead Long history of smoking  Smoking cessation recommended  Anemia in chronic kidney disease, on chronic dialysis ([place]) - Anemia managed by nephrology, appears stable  benign hypertensive renal disease - Plan: EKG 12-Lead LVH noted on echocardiogram,  Blood pressure stable, no medication changes made  Cardiac  hypertrophy Secondary to longstanding hypertension  Ascites Fluid management by hemodialysis Long discussion concerning need to stay compliant with her dialysis episodes, compliant with her torsemide   Several hospital admissions records reviewed Long discussion with her concerning need to be compliant with dialysis, fluid restriction  Total encounter time more than 45 minutes  Greater than 50% was spent in counseling and coordination of care with the patient     Signed, Esmond Plants, M.D., Ph.D. 05/05/2021  [facility] [place], [place] [phone]

## 2021-05-09 ENCOUNTER — Ambulatory Visit: Payer: Medicare Other | Admitting: Cardiovascular Disease

## 2021-05-09 DIAGNOSIS — I10 Essential (primary) hypertension: Secondary | ICD-10-CM

## 2021-05-09 DIAGNOSIS — N186 End stage renal disease: Secondary | ICD-10-CM

## 2021-05-09 DIAGNOSIS — I5033 Acute on chronic diastolic (congestive) heart failure: Secondary | ICD-10-CM

## 2021-05-09 DIAGNOSIS — Z720 Tobacco use: Secondary | ICD-10-CM

## 2021-05-09 DIAGNOSIS — I272 Pulmonary hypertension, unspecified: Secondary | ICD-10-CM

## 2021-05-17 NOTE — Progress Notes (Signed)
Cardiology Office Note    Date:  05/19/2021   ID:  Janet Olson, DOB [birth year], MRN [id number]  PCP:  Remi Haggard, FNP  Cardiologist:  Ida Rogue, MD  Electrophysiologist:  None   Chief Complaint: Follow up  History of Present Illness:   Janet Olson is a 51 y.o. female with history of HFpEF, pulmonary hypertension, ESRD on HD, COPD with ongoing tobacco use, ascites requiring intermittent paracentesis, HTN, anemia of chronic disease, GI bleeding related to hemorrhoids, GERD, and medical nonadherence who presents for follow up of HFpEF.   She was previously evaluated by Dr. Rockey Situ in 12/2017 for concern of abnormal echo and diastolic dysfunction. It was felt that if she was having issues with volume, her dry weight may need to be lowered with HD.  Repeat echo in 12/2018, again showed normal LV function with an EF of 55-60%, mildly to moderately dilated RV with mild reduction in RV systolic function and moderate elevation of pulmonary pressures.  She was subsequently seen by [facility] with recommendation for continuing current medications and smoking cessation.    She was admitted in 01/2020 with volume overload in the setting of missing HD. She was noted to have ascites that required paracentesis with 2 L of fluid removed. Echo in 02/2020 showed an EF of 50-55%, no RWMA, interventricular septal flattening in diastole consistent with volume overload, moderately reduced RV systolic function with a mildly enlarged RV cavity size, moderately elevated PASP, moderately dilated pulmonary artery, and an estimated right atrial pressure of 15 mmHg. She was readmitted in 02/2020 with recurrent volume overload in the setting of missing HD and again required paracentesis. She was admitted in 10/2020 with hypervolemia. Echo showed an EF of 55-60%, no RWMA, mild LVH, normal RVSF, mildly enlarged RV cavity size, severely elevated PASP at 65.7 mmHg, mildly to moderately dilated left atrium,  moderately dilated right atrium, mild mitral,, and an estimated right atrial pressure of 15 mmHg. We were asked to perform a TEE due to enterococcus bacteremia, though this was not able to be performed secondary to the patient leaving AMA prior to completion of imaging study. Since that admission, she has required several outpatient paracenteses coordinated through nephrology, most recently in 01/2021 with 3.8 L of fluid removed.   Notes from PCP indicate she underwent echo at her PCPs office on 01/09/2021 which report indicates showed an EF of 65%, LVH, dilated left atrium, severely dilated right atrium, mitral regurgitation, estimated RVSP 29 mmHg, normal size aortic root, and no evidence of a pericardial effusion.  Carotid artery ultrasound from 01/09/2021 indicates 60 to 79% stenosis of the RICA and 40 to 59% stenosis of the LICA.  She comes in doing well today.  Since she was last seen, no chest pain.  She notes stable dyspnea.  She does continue to drink large quantities of liquids per day and does not watch her sodium intake.  She has not needed a paracentesis since 01/2021.  She does note a 1 month history of intermittent palpitations which will last for several seconds in duration and are not associated with symptoms.  She does not feel like she is holding onto too much fluid in her abdomen.  She is tolerating amlodipine, metoprolol, and torsemide without issues.  Not currently on a statin.   Labs independently reviewed: 10/2020 - HGB 8.5, PLT 232, potassium 3.4, BUN 32, SCr 5.08, TSH normal, AST normal, ALT 72, albumin 3.3, magnesium 1.6 04/2018 - TC 170, TG 109,  HDL 56, LDL 92  Past Medical History:  Diagnosis Date  . (HFpEF) heart failure with preserved ejection fraction ([place])    a. 2015 Echo: Nl EF. Mild to mod LVH. Mild PR/MR; b. 04/2017 Echo: EF 55-60%, no rwma, Gr1 DD. Mildly dil LA. Small effusion; c. 12/2018 Echo: EF 55-60%, mildly dil LA/PA. Mod dil RA. Mild to mod RV dil. Mildly reduced RV  fxn. Mild TR. Mod elev PASP.  Marland Kitchen Abdominal ascites   . Acute hypoxemic respiratory failure ([place]) 01/03/2019  . Acute renal failure (ARF) ([place]) 05/17/2017  . Acute respiratory failure ([place]) 09/25/2018  . Anemia   . Anxiety   . Arthritis   . Asthma   . Cardiomegaly   . COPD (chronic obstructive pulmonary disease) ([place])   . Depression   . Dialysis patient ([place])    MON, WED , FRI  . Dyspnea   . ESRD (end stage renal disease) ([place])   . GERD (gastroesophageal reflux disease)   . Headache    Migraines  . Hypertension   . Nonrheumatic tricuspid (valve) insufficiency   . Respiratory failure with hypoxia ([place]) 04/21/2018  . Ulcer    gastric  . Vitamin D deficiency     Past Surgical History:  Procedure Laterality Date  . A/V FISTULAGRAM Left 04/17/2018   Procedure: A/V FISTULAGRAM;  Surgeon: Algernon Huxley, MD;  Location: [place] [facility];  Service: Cardiovascular;  Laterality: Left;  . A/V FISTULAGRAM Left 05/27/2018   Procedure: A/V FISTULAGRAM;  Surgeon: Katha Cabal, MD;  Location: [place] [facility];  Service: Cardiovascular;  Laterality: Left;  . A/V FISTULAGRAM Left 07/05/2020   Procedure: A/V FISTULAGRAM;  Surgeon: Katha Cabal, MD;  Location: [place] [facility];  Service: Cardiovascular;  Laterality: Left;  . AV FISTULA PLACEMENT Left 08/08/2017   Procedure: ARTERIOVENOUS (AV) FISTULA CREATION ( BRACHIOCEPHALIC );  Surgeon: Algernon Huxley, MD;  Location: [facility];  Service: Vascular;  Laterality: Left;  . AV FISTULA PLACEMENT Left 08/05/2020   Procedure: INSERTION OF ARTERIOVENOUS (AV) GRAFT ARM(BRACHIAL AXILLARY);  Surgeon: Katha Cabal, MD;  Location: [facility];  Service: Vascular;  Laterality: Left;  . CESAREAN SECTION     x 4 (1988, 1991, 1997, 2000 )  . COLONOSCOPY WITH PROPOFOL N/A 04/24/2018   Procedure: COLONOSCOPY WITH PROPOFOL;  Surgeon: Lin Landsman, MD;  Location[facility];  Service: Gastroenterology;  Laterality: N/A;  . DIALYSIS/PERMA CATHETER  INSERTION N/A 07/16/2017   Procedure: Dialysis/Perma Catheter Insertion;  Surgeon: Katha Cabal, MD;  Location: [place] [facility];  Service: Cardiovascular;  Laterality: N/A;  . DIALYSIS/PERMA CATHETER REMOVAL N/A 10/29/2017   Procedure: DIALYSIS/PERMA CATHETER REMOVAL;  Surgeon: Katha Cabal, MD;  Location: [place] [facility];  Service: Cardiovascular;  Laterality: N/A;  . DIALYSIS/PERMA CATHETER REMOVAL N/A 11/03/2020   Procedure: DIALYSIS/PERMA CATHETER REMOVAL;  Surgeon: Algernon Huxley, MD;  Location: [place] [facility];  Service: Cardiovascular;  Laterality: N/A;  . excision of lymph nodes Bilateral 2014   bilateral under arms.  Marland Kitchen PARATHYROID IMPLANT REMOVAL    . REMOVAL OF A DIALYSIS CATHETER Left 08/05/2020   Procedure: REMOVAL OF A DIALYSIS CATHETER ( FISTULA );  Surgeon: Katha Cabal, MD;  Location: [facility];  Service: Vascular;  Laterality: Left;    Current Medications: Current Meds  Medication Sig  . albuterol (PROVENTIL) (2.5 MG/3ML) 0.083% nebulizer solution Take 2.5 mg by nebulization every 6 (six) hours as needed for wheezing or shortness of breath.   Marland Kitchen  albuterol (VENTOLIN HFA) 108 (90 Base) MCG/ACT inhaler Inhale 2 puffs into the lungs every 6 (six) hours as needed for wheezing or shortness of breath.   Marland Kitchen amLODipine (NORVASC) 10 MG tablet Take 1 tablet (10 mg total) by mouth daily.  . benzonatate (TESSALON) 100 MG capsule Take 100 mg by mouth 2 (two) times daily as needed for cough.   . calcium acetate (PHOSLO) 667 MG capsule Take 2 capsules (1,334 mg total) by mouth 3 (three) times daily with meals.  . chlorpheniramine-HYDROcodone (TUSSIONEX) 10-8 MG/5ML SUER Take 5 mLs by mouth daily as needed for cough.   . Cholecalciferol (VITAMIN D3) 125 MCG (5000 UT) CAPS Take 5,000 Units by mouth once a week.  . clotrimazole (MYCELEX) 10 MG troche Take by mouth.  . CREON 36000-114000 units CPEP capsule Take by mouth.  . docusate sodium (COLACE) 100 MG capsule Take  100 mg by mouth daily as needed for moderate constipation.   . DULERA 200-5 MCG/ACT AERO Inhale 2 puffs into the lungs 2 (two) times daily.  . DULoxetine (CYMBALTA) 30 MG capsule Take 30 mg by mouth 2 (two) times daily.  . hydrOXYzine (ATARAX/VISTARIL) 25 MG tablet Take 25 mg by mouth every 8 (eight) hours as needed for itching.   . lidocaine-prilocaine (EMLA) cream   . metoprolol tartrate (LOPRESSOR) 50 MG tablet Take 50 mg by mouth 2 (two) times daily.  . multivitamin (RENA-VIT) TABS tablet Take 1 tablet by mouth daily.   Marland Kitchen neomycin-polymyxin-hydrocortisone (CORTISPORIN) 3.5-10000-1 OTIC suspension PLACE 4 DROPS IN AFFECTED EARS THREE TIMES DAILY UNTIL ITCHING RELIEVED  . oxyCODONE (OXY IR/ROXICODONE) 5 MG immediate release tablet Take 1 tablet (5 mg total) by mouth every 6 (six) hours as needed for moderate pain or severe pain.  Marland Kitchen oxyCODONE-acetaminophen (PERCOCET) 5-325 MG tablet Take 1-2 tablets by mouth every 6 (six) hours as needed for moderate pain or severe pain.  . pantoprazole (PROTONIX) 40 MG tablet Take 1 tablet (40 mg total) by mouth daily.  . QUEtiapine (SEROQUEL) 25 MG tablet Take 25 mg by mouth at bedtime as needed (sleep).   . torsemide (DEMADEX) 100 MG tablet Take 1 tablet (100 mg total) by mouth daily.  . Vitamin D, Ergocalciferol, (DRISDOL) 50000 units CAPS capsule Take 50,000 Units by mouth every Sunday.     Allergies:   Patient has no known allergies.   Social History   Socioeconomic History  . Marital status: Single    Spouse name: Not on file  . Number of children: Not on file  . Years of education: Not on file  . Highest education level: Not on file  Occupational History  . Not on file  Tobacco Use  . Smoking status: Current Every Day Smoker    Packs/day: 0.50    Years: 30.00    Pack years: 15.00    Types: Cigarettes  . Smokeless tobacco: Never Used  Vaping Use  . Vaping Use: Never used  Substance and Sexual Activity  . Alcohol use: No  . Drug use: No   . Sexual activity: Not Currently  Other Topics Concern  . Not on file  Social History Narrative  . Not on file   Social Determinants of Health   Financial Resource Strain: Not on file  Food Insecurity: Not on file  Transportation Needs: Not on file  Physical Activity: Not on file  Stress: Not on file  Social Connections: Not on file     Family History:  The patient's family history includes Diabetes  in her mother.  ROS:   Review of Systems  Constitutional: Positive for malaise/fatigue. Negative for chills, diaphoresis, fever and weight loss.  HENT: Negative for congestion.   Eyes: Negative for discharge and redness.  Respiratory: Negative for cough, sputum production, shortness of breath and wheezing.   Cardiovascular: Negative for chest pain, palpitations, orthopnea, claudication, leg swelling and PND.  Gastrointestinal: Negative for abdominal pain, blood in stool, heartburn, melena, nausea and vomiting.  Musculoskeletal: Negative for falls and myalgias.  Skin: Negative for rash.  Neurological: Positive for weakness. Negative for dizziness, tingling, tremors, sensory change, speech change, focal weakness and loss of consciousness.  Endo/Heme/Allergies: Does not bruise/bleed easily.  Psychiatric/Behavioral: Negative for substance abuse. The patient is not nervous/anxious.   All other systems reviewed and are negative.    EKGs/Labs/Other Studies Reviewed:    Studies reviewed were summarized above. The additional studies were reviewed today:  2D echo 10/2020: 1. Left ventricular ejection fraction, by estimation, is 55 to 60%. Left  ventricular ejection fraction by PLAX is 60 %. The left ventricle has  normal function. The left ventricle has no regional wall motion  abnormalities. There is mild left ventricular  hypertrophy. Left ventricular diastolic parameters are indeterminate.  2. Right ventricular systolic function is normal. The right ventricular  size is mildly  enlarged. There is severely elevated pulmonary artery  systolic pressure.  3. Left atrial size was mild to moderately dilated.  4. Right atrial size was moderately dilated.  5. The mitral valve is normal in structure. Mild mitral valve  regurgitation.  6. The aortic valve is normal in structure. Aortic valve regurgitation is  not visualized.  7. The inferior vena cava is dilated in size with <50% respiratory  variability, suggesting right atrial pressure of 15 mmHg. __________  2D echo 02/2020: 1. Left ventricular ejection fraction, by estimation, is 50 to 55%. The  left ventricle has low normal function. The left ventricle has no regional  wall motion abnormalities. There is moderate left ventricular hypertrophy.  Left ventricular diastolic  parameters are indeterminate. There is the interventricular septum is  flattened in diastole ('D' shaped left ventricle), consistent with right  ventricular volume overload.  2. Right ventricular systolic function is moderately reduced. The right  ventricular size is mildly enlarged. There is moderately elevated  pulmonary artery systolic pressure.  3. Left atrial size was mildly dilated.  4. Right atrial size was moderately dilated.  5. Acites is present.. The pericardial effusion is posterior to the left  ventricle.  6. The mitral valve is myxomatous. Trivial mitral valve regurgitation.  7. The aortic valve is tricuspid. Aortic valve regurgitation is not  visualized. No aortic stenosis is present.  8. Moderately dilated pulmonary artery.  9. The inferior vena cava is dilated in size with <50% respiratory  variability, suggesting right atrial pressure of 15 mmHg. ___________  2D echo 04/2017: - Left ventricle: The cavity size was normal. There was mild  concentric hypertrophy. Systolic function was normal. The  estimated ejection fraction was in the range of 55% to 60%. Wall  motion was normal; there were no regional wall  motion  abnormalities. Doppler parameters are consistent with abnormal  left ventricular relaxation (grade 1 diastolic dysfunction).  - Left atrium: The atrium was mildly dilated.  - Pericardium, extracardiac: A small pericardial effusion was  identified circumferential to the heart and along the right  atrial free wall. There was no evidence of hemodynamic  compromise.   EKG:  EKG is ordered  today.  The EKG ordered today demonstrates NSR, 75 bpm, first-degree AV block, no acute ST-T changes, largely unchanged when compared to prior tracing  Recent Labs: 11/02/2020: B Natriuretic Peptide >4,500.0; Magnesium 1.6; TSH 1.406 11/05/2020: ALT 78; BUN 32; Creatinine, Ser 5.08; Hemoglobin 8.2; Platelets 137; Potassium 3.8; Sodium 132  Recent Lipid Panel    Component Value Date/Time   CHOL 198 05/17/2017 1436   TRIG 197 (H) 05/17/2017 1436   HDL 63 05/17/2017 1436   CHOLHDL 3.1 05/17/2017 1436   VLDL 39 05/17/2017 1436   LDLCALC 96 05/17/2017 1436    PHYSICAL EXAM:    VS:  BP 124/62 (BP Location: Right Arm, Patient Position: Sitting, Cuff Size: Normal)   Pulse 75   Ht 5\' 6"  (1.676 m)   Wt 185 lb (83.9 kg)   LMP 06/10/2018 (Approximate)   SpO2 98%   BMI 29.86 kg/m   BMI: Body mass index is 29.86 kg/m.  Physical Exam Vitals reviewed.  Constitutional:      Appearance: She is well-developed.  HENT:     Head: Normocephalic and atraumatic.  Eyes:     General:        Right eye: No discharge.        Left eye: No discharge.  Neck:     Vascular: No JVD.  Cardiovascular:     Rate and Rhythm: Normal rate and regular rhythm.     Pulses: No midsystolic click and no opening snap.          Posterior tibial pulses are 2+ on the right side and 2+ on the left side.     Heart sounds: Normal heart sounds, S1 normal and S2 normal. Heart sounds not distant. No murmur heard. No friction rub.  Pulmonary:     Effort: Pulmonary effort is normal. No respiratory distress.     Breath  sounds: Normal breath sounds. No decreased breath sounds, wheezing or rales.  Chest:     Chest wall: No tenderness.  Abdominal:     General: There is distension.     Palpations: Abdomen is soft.     Tenderness: There is no abdominal tenderness.  Musculoskeletal:     Cervical back: Normal range of motion.  Skin:    General: Skin is warm and dry.     Nails: There is no clubbing.  Neurological:     Mental Status: She is alert and oriented to person, place, and time.  Psychiatric:        Speech: Speech normal.        Behavior: Behavior normal.        Thought Content: Thought content normal.        Judgment: Judgment normal.     Wt Readings from Last 3 Encounters:  05/19/21 185 lb (83.9 kg)  02/28/21 182 lb (82.6 kg)  02/07/21 184 lb 6 oz (83.6 kg)     ASSESSMENT & PLAN:   1. HFpEF with severe pulmonary hypertension: She does appear mildly volume overloaded on exam today with abdominal distention noted.  She is scheduled for hemodialysis later this morning.  Volume management per hemodialysis, though she does remain on daily torsemide.  2. Palpitations: Place Zio patch.  Check TSH.  3. Carotid artery disease: Noted to have moderate bilateral ICA disease on carotid artery ultrasound through PCPs office in 12/2020.  Would recommend addition of aspirin 81 mg if there is no significant contraindication from PCP/nephrology/GI perspective.  Would also recommend addition of high intensity statin pending LFT.  She should undergo repeat carotid artery imaging in 12/2021 either through our office or her PCP's.  4. ESRD: HD per nephrology.  5. Recurrent ascites: Stable.  Fluid management per nephrology.  6. HTN: Blood pressure is well controlled in the office.  Continue current medications including amlodipine and Lopressor.  Low-sodium diet recommended.  7. Anemia of chronic disease: Managed by nephrology.  Follow-up as directed.    8. Abnormal LFT: Follow-up with PCP/GI as  directed.  9. COPD: No acute exacerbation.  PFTs showed a restrictive process.  Follow-up with PCP as directed  10. Medical/dietary noncompliance: Recommend medical adherence.  Disposition: F/u with Dr. Rockey Situ or an APP in 6 weeks.   Medication Adjustments/Labs and Tests Ordered: Current medicines are reviewed at length with the patient today.  Concerns regarding medicines are outlined above. Medication changes, Labs and Tests ordered today are summarized above and listed in the Patient Instructions accessible in Encounters.   Signed, Christell Faith, PA-C 05/19/2021 9:49 AM     [facility] [address] [place] [address] [place], [place] [address] [phone]

## 2021-05-19 ENCOUNTER — Other Ambulatory Visit: Payer: Self-pay

## 2021-05-19 ENCOUNTER — Encounter: Payer: Self-pay | Admitting: Physician Assistant

## 2021-05-19 ENCOUNTER — Ambulatory Visit (INDEPENDENT_AMBULATORY_CARE_PROVIDER_SITE_OTHER): Payer: Medicare Other | Admitting: Physician Assistant

## 2021-05-19 ENCOUNTER — Ambulatory Visit (INDEPENDENT_AMBULATORY_CARE_PROVIDER_SITE_OTHER): Payer: Medicare Other

## 2021-05-19 VITALS — BP 124/62 | HR 75 | Ht 66.0 in | Wt 185.0 lb

## 2021-05-19 DIAGNOSIS — R002 Palpitations: Secondary | ICD-10-CM | POA: Diagnosis not present

## 2021-05-19 DIAGNOSIS — I5032 Chronic diastolic (congestive) heart failure: Secondary | ICD-10-CM | POA: Diagnosis not present

## 2021-05-19 DIAGNOSIS — N186 End stage renal disease: Secondary | ICD-10-CM

## 2021-05-19 DIAGNOSIS — D638 Anemia in other chronic diseases classified elsewhere: Secondary | ICD-10-CM

## 2021-05-19 DIAGNOSIS — I6523 Occlusion and stenosis of bilateral carotid arteries: Secondary | ICD-10-CM | POA: Diagnosis not present

## 2021-05-19 DIAGNOSIS — I272 Pulmonary hypertension, unspecified: Secondary | ICD-10-CM

## 2021-05-19 DIAGNOSIS — I10 Essential (primary) hypertension: Secondary | ICD-10-CM

## 2021-05-19 DIAGNOSIS — R945 Abnormal results of liver function studies: Secondary | ICD-10-CM

## 2021-05-19 DIAGNOSIS — R7989 Other specified abnormal findings of blood chemistry: Secondary | ICD-10-CM

## 2021-05-19 DIAGNOSIS — R188 Other ascites: Secondary | ICD-10-CM

## 2021-05-19 DIAGNOSIS — Z9119 Patient's noncompliance with other medical treatment and regimen: Secondary | ICD-10-CM

## 2021-05-19 DIAGNOSIS — J449 Chronic obstructive pulmonary disease, unspecified: Secondary | ICD-10-CM

## 2021-05-19 DIAGNOSIS — Z91199 Patient's noncompliance with other medical treatment and regimen due to unspecified reason: Secondary | ICD-10-CM

## 2021-05-19 NOTE — Patient Instructions (Signed)
Medication Instructions:  No changes at this time.  *If you need a refill on your cardiac medications before your next appointment, please call your pharmacy*   Lab Work: Lipid, Liver function test, TSH, and Direct LDL   If you have labs (blood work) drawn today and your tests are completely normal, you will receive your results only by: Marland Kitchen MyChart Message (if you have MyChart) OR . A paper copy in the mail If you have any lab test that is abnormal or we need to change your treatment, we will call you to review the results.   Testing/Procedures: Your physician has recommended that you wear a Zio monitor.   This monitor is a medical device that records the heart's electrical activity. Doctors most often use these monitors to diagnose arrhythmias. Arrhythmias are problems with the speed or rhythm of the heartbeat. The monitor is a small device applied to your chest. You can wear one while you do your normal daily activities. While wearing this monitor if you have any symptoms to push the button and record what you felt. Once you have worn this monitor for the period of time provider prescribed (Usually 14 days), you will return the monitor device in the postage paid box. Once it is returned they will download the data collected and provide [place] with a report which the provider will then review and we will call you with those results. Important tips:  1. Avoid showering during the first 24 hours of wearing the monitor. 2. Avoid excessive sweating to help maximize wear time. 3. Do not submerge the device, no hot tubs, and no swimming pools. 4. Keep any lotions or oils away from the patch. 5. After 24 hours you may shower with the patch on. Take brief showers with your back facing the shower head.  6. Do not remove patch once it has been placed because that will interrupt data and decrease adhesive wear time. 7. Push the button when you have any symptoms and write down what you were  feeling. 8. Once you have completed wearing your monitor, remove and place into box which has postage paid and place in your outgoing mailbox.  9. If for some reason you have misplaced your box then call our office and we can provide another box and/or mail it off for you.    Follow-Up: At [facility], you and your health needs are our priority.  As part of our continuing mission to provide you with exceptional heart care, we have created designated Provider Care Teams.  These Care Teams include your primary Cardiologist (physician) and Advanced Practice Providers (APPs -  Physician Assistants and Nurse Practitioners) who all work together to provide you with the care you need, when you need it.  We recommend signing up for the patient portal called "MyChart".  Sign up information is provided on this After Visit Summary.  MyChart is used to connect with patients for Virtual Visits (Telemedicine).  Patients are able to view lab/test results, encounter notes, upcoming appointments, etc.  Non-urgent messages can be sent to your provider as well.   To learn more about what you can do with MyChart, go to [email or web address].    Your next appointment:   6 week(s)  The format for your next appointment:   In Person  Provider:   Ida Rogue, MD or Christell Faith, PA-C

## 2021-05-20 LAB — HEPATIC FUNCTION PANEL
ALT: 23 IU/L (ref 0–32)
AST: 26 IU/L (ref 0–40)
Albumin: 3.9 g/dL (ref 3.8–4.9)
Alkaline Phosphatase: 164 IU/L — ABNORMAL HIGH (ref 44–121)
Bilirubin Total: 0.3 mg/dL (ref 0.0–1.2)
Bilirubin, Direct: 0.17 mg/dL (ref 0.00–0.40)
Total Protein: 7.9 g/dL (ref 6.0–8.5)

## 2021-05-20 LAB — LIPID PANEL
Chol/HDL Ratio: 1.7 ratio (ref 0.0–4.4)
Cholesterol, Total: 121 mg/dL (ref 100–199)
HDL: 70 mg/dL (ref >39)
LDL Chol Calc (NIH): 39 mg/dL (ref 0–99)
Triglycerides: 49 mg/dL (ref 0–149)
VLDL Cholesterol Cal: 12 mg/dL (ref 5–40)

## 2021-05-20 LAB — LDL CHOLESTEROL, DIRECT: LDL Direct: 43 mg/dL (ref 0–99)

## 2021-05-20 LAB — TSH: TSH: 1.77 u[IU]/mL (ref 0.450–4.500)

## 2021-05-25 ENCOUNTER — Other Ambulatory Visit: Payer: Self-pay

## 2021-05-25 ENCOUNTER — Encounter (INDEPENDENT_AMBULATORY_CARE_PROVIDER_SITE_OTHER): Payer: Self-pay | Admitting: Vascular Surgery

## 2021-05-25 ENCOUNTER — Ambulatory Visit (INDEPENDENT_AMBULATORY_CARE_PROVIDER_SITE_OTHER): Payer: Medicare Other

## 2021-05-25 ENCOUNTER — Ambulatory Visit (INDEPENDENT_AMBULATORY_CARE_PROVIDER_SITE_OTHER): Payer: Medicare Other | Admitting: Vascular Surgery

## 2021-05-25 VITALS — BP 127/70 | HR 69 | Ht 66.0 in | Wt 188.0 lb

## 2021-05-25 DIAGNOSIS — I5032 Chronic diastolic (congestive) heart failure: Secondary | ICD-10-CM

## 2021-05-25 DIAGNOSIS — I6523 Occlusion and stenosis of bilateral carotid arteries: Secondary | ICD-10-CM | POA: Diagnosis not present

## 2021-05-25 DIAGNOSIS — J449 Chronic obstructive pulmonary disease, unspecified: Secondary | ICD-10-CM

## 2021-05-25 DIAGNOSIS — N186 End stage renal disease: Secondary | ICD-10-CM

## 2021-05-25 DIAGNOSIS — M25511 Pain in right shoulder: Secondary | ICD-10-CM

## 2021-05-25 DIAGNOSIS — Z992 Dependence on renal dialysis: Secondary | ICD-10-CM

## 2021-05-25 DIAGNOSIS — T829XXS Unspecified complication of cardiac and vascular prosthetic device, implant and graft, sequela: Secondary | ICD-10-CM

## 2021-05-25 DIAGNOSIS — I10 Essential (primary) hypertension: Secondary | ICD-10-CM

## 2021-05-25 DIAGNOSIS — M25519 Pain in unspecified shoulder: Secondary | ICD-10-CM | POA: Insufficient documentation

## 2021-05-25 NOTE — Progress Notes (Signed)
MRN : [id number]  Janet Olson is a 51 y.o. (07/16/1970) female who presents with chief complaint of  Chief Complaint  Patient presents with  . Follow-up    5 Mo U/S  .  History of Present Illness:   The patient returns to the office for followup of their dialysis access. The function of the access has been stable. The patient denies increased bleeding time or increased recirculation. Patient denies difficulty with cannulation. The patient denies hand pain or other symptoms consistent with steal phenomena.  No significant arm swelling but she is having some right arm pain that begins in the shoulder and radiates down to the fingers.    The patient denies redness or swelling at the access site. The patient denies fever or chills at home or while on dialysis.  The patient denies amaurosis fugax or recent TIA symptoms. There are no recent neurological changes noted. The patient denies claudication symptoms or rest pain symptoms. The patient denies history of DVT, PE or superficial thrombophlebitis. The patient denies recent episodes of angina or shortness of breath.   Duplex ultrasound of the left brachial axillary AV graft demonstrates a flow volume of 1808 with velocities that are uniform through the graft.  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 albuterol (PROVENTIL) (2.5 MG/3ML) 0.083% nebulizer solution Take 2.5 mg by nebulization every 6 (six) hours as needed for wheezing or shortness of breath.   Marland Kitchen albuterol (VENTOLIN HFA) 108 (90 Base) MCG/ACT inhaler Inhale 2 puffs into the lungs every 6 (six) hours as needed for wheezing or shortness of breath.   Marland Kitchen amLODipine (NORVASC) 10 MG tablet Take 1 tablet (10 mg total) by mouth daily.  . benzonatate (TESSALON) 100 MG capsule Take 100 mg by mouth 2 (two) times daily as needed for cough.   . calcium acetate (PHOSLO) 667 MG capsule Take 2 capsules (1,334 mg total)  by mouth 3 (three) times daily with meals.  . chlorpheniramine-HYDROcodone (TUSSIONEX) 10-8 MG/5ML SUER Take 5 mLs by mouth daily as needed for cough.   . Cholecalciferol (VITAMIN D3) 125 MCG (5000 UT) CAPS Take 5,000 Units by mouth once a week.  . clotrimazole (MYCELEX) 10 MG troche Take by mouth.  . CREON 36000-114000 units CPEP capsule Take by mouth.  . docusate sodium (COLACE) 100 MG capsule Take 100 mg by mouth daily as needed for moderate constipation.   . DULERA 200-5 MCG/ACT AERO Inhale 2 puffs into the lungs 2 (two) times daily.  . DULoxetine (CYMBALTA) 30 MG capsule Take 30 mg by mouth 2 (two) times daily.  . DULoxetine (CYMBALTA) 60 MG capsule Take 60 mg by mouth 2 (two) times daily.  . hydrOXYzine (ATARAX/VISTARIL) 25 MG tablet Take 25 mg by mouth every 8 (eight) hours as needed for itching.   . lidocaine-prilocaine (EMLA) cream   . metoprolol tartrate (LOPRESSOR) 50 MG tablet Take 50 mg by mouth 2 (two) times daily.  . multivitamin (RENA-VIT) TABS tablet Take 1 tablet by mouth daily.   Marland Kitchen neomycin-polymyxin-hydrocortisone (CORTISPORIN) 3.5-10000-1 OTIC suspension PLACE 4 DROPS IN AFFECTED EARS THREE TIMES DAILY UNTIL ITCHING RELIEVED  . oxyCODONE (OXY IR/ROXICODONE) 5 MG immediate release tablet Take 1 tablet (5 mg total) by mouth every 6 (six) hours as needed for moderate pain or severe pain.  Marland Kitchen oxyCODONE-acetaminophen (PERCOCET) 5-325 MG tablet Take 1-2 tablets by mouth every 6 (six) hours as needed for moderate  pain or severe pain.  . pantoprazole (PROTONIX) 40 MG tablet Take 1 tablet (40 mg total) by mouth daily.  . QUEtiapine (SEROQUEL) 25 MG tablet Take 25 mg by mouth at bedtime as needed (sleep).   . torsemide (DEMADEX) 100 MG tablet Take 1 tablet (100 mg total) by mouth daily.  . Vitamin D, Ergocalciferol, (DRISDOL) 50000 units CAPS capsule Take 50,000 Units by mouth every Sunday.     Past Medical History:  Diagnosis Date  . (HFpEF) heart failure with preserved ejection  fraction ([place])    a. 2015 Echo: Nl EF. Mild to mod LVH. Mild PR/MR; b. 04/2017 Echo: EF 55-60%, no rwma, Gr1 DD. Mildly dil LA. Small effusion; c. 12/2018 Echo: EF 55-60%, mildly dil LA/PA. Mod dil RA. Mild to mod RV dil. Mildly reduced RV fxn. Mild TR. Mod elev PASP.  Marland Kitchen Abdominal ascites   . Acute hypoxemic respiratory failure ([place]) 01/03/2019  . Acute renal failure (ARF) ([place]) 05/17/2017  . Acute respiratory failure ([place]) 09/25/2018  . Anemia   . Anxiety   . Arthritis   . Asthma   . Cardiomegaly   . COPD (chronic obstructive pulmonary disease) ([place])   . Depression   . Dialysis patient ([place])    MON, WED , FRI  . Dyspnea   . ESRD (end stage renal disease) ([place])   . GERD (gastroesophageal reflux disease)   . Headache    Migraines  . Hypertension   . Nonrheumatic tricuspid (valve) insufficiency   . Respiratory failure with hypoxia ([place]) 04/21/2018  . Ulcer    gastric  . Vitamin D deficiency     Past Surgical History:  Procedure Laterality Date  . A/V FISTULAGRAM Left 04/17/2018   Procedure: A/V FISTULAGRAM;  Surgeon: Algernon Huxley, MD;  Location: [place];  Service: Cardiovascular;  Laterality: Left;  . A/V FISTULAGRAM Left 05/27/2018   Procedure: A/V FISTULAGRAM;  Surgeon: Katha Cabal, MD;  Location: [place] [facility];  Service: Cardiovascular;  Laterality: Left;  . A/V FISTULAGRAM Left 07/05/2020   Procedure: A/V FISTULAGRAM;  Surgeon: Katha Cabal, MD;  Location: [place] [facility];  Service: Cardiovascular;  Laterality: Left;  . AV FISTULA PLACEMENT Left 08/08/2017   Procedure: ARTERIOVENOUS (AV) FISTULA CREATION ( BRACHIOCEPHALIC );  Surgeon: Algernon Huxley, MD;  Location: [facility];  Service: Vascular;  Laterality: Left;  . AV FISTULA PLACEMENT Left 08/05/2020   Procedure: INSERTION OF ARTERIOVENOUS (AV) GRAFT ARM(BRACHIAL AXILLARY);  Surgeon: Katha Cabal, MD;  Location: [facility];  Service: Vascular;  Laterality: Left;  . CESAREAN SECTION     x 4 (1988,  1991, 1997, 2000 )  . COLONOSCOPY WITH PROPOFOL N/A 04/24/2018   Procedure: COLONOSCOPY WITH PROPOFOL;  Surgeon: Lin Landsman, MD;  Location: [facility];  Service: Gastroenterology;  Laterality: N/A;  . DIALYSIS/PERMA CATHETER INSERTION N/A 07/16/2017   Procedure: Dialysis/Perma Catheter Insertion;  Surgeon: Katha Cabal, MD;  Location: [place];  Service: Cardiovascular;  Laterality: N/A;  . DIALYSIS/PERMA CATHETER REMOVAL N/A 10/29/2017   Procedure: DIALYSIS/PERMA CATHETER REMOVAL;  Surgeon: Katha Cabal, MD;  Location: [place] [facility];  Service: Cardiovascular;  Laterality: N/A;  . DIALYSIS/PERMA CATHETER REMOVAL N/A 11/03/2020   Procedure: DIALYSIS/PERMA CATHETER REMOVAL;  Surgeon: Algernon Huxley, MD;  Location: [place] [facility];  Service: Cardiovascular;  Laterality: N/A;  . excision of lymph nodes Bilateral 2014   bilateral under arms.  Marland Kitchen PARATHYROID IMPLANT REMOVAL    .  REMOVAL OF A DIALYSIS CATHETER Left 08/05/2020   Procedure: REMOVAL OF A DIALYSIS CATHETER ( FISTULA );  Surgeon: Katha Cabal, MD;  Location: [facility];  Service: Vascular;  Laterality: Left;    Social History Social History   Tobacco Use  . Smoking status: Current Every Day Smoker    Packs/day: 0.50    Years: 30.00    Pack years: 15.00    Types: Cigarettes  . Smokeless tobacco: Never Used  Vaping Use  . Vaping Use: Never used  Substance Use Topics  . Alcohol use: No  . Drug use: No    Family History Family History  Problem Relation Age of Onset  . Diabetes Mother     No Known Allergies   REVIEW OF SYSTEMS (Negative unless checked)  Constitutional: [] Weight loss  [] Fever  [] Chills Cardiac: [] Chest pain   [] Chest pressure   [] Palpitations   [] Shortness of breath when laying flat   [] Shortness of breath with exertion. Vascular:  [] Pain in legs with walking   [] Pain in legs at rest  [] History of DVT   [] Phlebitis   [] Swelling in legs   [] Varicose veins    [] Non-healing ulcers Pulmonary:   [] Uses home oxygen   [] Productive cough   [] Hemoptysis   [] Wheeze  [] COPD   [] Asthma Neurologic:  [] Dizziness   [] Seizures   [] History of stroke   [] History of TIA  [] Aphasia   [] Vissual changes   [] Weakness or numbness in arm   [] Weakness or numbness in leg Musculoskeletal:   [] Joint swelling   [x] Joint pain   [] Low back pain Hematologic:  [] Easy bruising  [] Easy bleeding   [] Hypercoagulable state   [] Anemic Gastrointestinal:  [] Diarrhea   [] Vomiting  [] Gastroesophageal reflux/heartburn   [] Difficulty swallowing. Genitourinary:  [x] Chronic kidney disease   [] Difficult urination  [] Frequent urination   [] Blood in urine Skin:  [] Rashes   [] Ulcers  Psychological:  [] History of anxiety   []  History of major depression.  Physical Examination  Vitals:   05/25/21 1535  BP: 127/70  Pulse: 69  Weight: 188 lb (85.3 kg)  Height: 5\' 6"  (1.676 m)   Body mass index is 30.34 kg/m. Gen: WD/WN, NAD Head: [place]/AT, No temporalis wasting.  Ear/Nose/Throat: Hearing grossly intact, nares w/o erythema or drainage Eyes: PER, EOMI, sclera nonicteric.  Neck: Supple, no large masses.   Pulmonary:  Good air movement, no audible wheezing bilaterally, no use of accessory muscles.  Cardiac: RRR, no JVD Vascular: Left arm AV graft good thrill good bruit Vessel Right Left  Radial Palpable Palpable  Gastrointestinal: Non-distended. No guarding/no peritoneal signs.  Musculoskeletal: M/S 5/5 throughout.  Patient is unable to raise her right arm past neutral passively any position above neutral elicits severe pain Neurologic: CN 2-12 intact. Symmetrical.  Speech is fluent. Motor exam as listed above. Psychiatric: Judgment intact, Mood & affect appropriate for pt's clinical situation. Dermatologic: No rashes or ulcers noted.  No changes consistent with cellulitis.  CBC Lab Results  Component Value Date   WBC 8.2 11/05/2020   HGB 8.2 (L) 11/05/2020   HCT 26.0 (L) 11/05/2020   MCV  82.8 11/05/2020   PLT 137 (L) 11/05/2020    BMET    Component Value Date/Time   NA 132 (L) 11/05/2020 0342   NA 139 09/23/2014 1741   K 3.8 11/05/2020 0342   K 4.2 09/23/2014 1741   CL 97 (L) 11/05/2020 0342   CL 112 (H) 09/23/2014 1741   CO2 29 11/05/2020 0342  CO2 21 09/23/2014 1741   GLUCOSE 109 (H) 11/05/2020 0342   GLUCOSE 82 09/23/2014 1741   BUN 32 (H) 11/05/2020 0342   BUN 18 09/23/2014 1741   CREATININE 5.08 (H) 11/05/2020 0342   CREATININE 1.41 (H) 09/23/2014 1741   CALCIUM 8.3 (L) 11/05/2020 0342   CALCIUM 8.4 (L) 01/03/2019 0243   GFRNONAA 10 (L) 11/05/2020 0342   GFRNONAA 43 (L) 09/23/2014 1741   GFRNONAA 43 (L) 11/20/2012 1129   GFRAA 3 (L) 08/08/2020 1303   GFRAA 52 (L) 09/23/2014 1741   GFRAA 49 (L) 11/20/2012 1129   CrCl cannot be calculated (Patient's most recent lab result is older than the maximum 21 days allowed.).  COAG Lab Results  Component Value Date   INR 1.4 (H) 11/03/2020   INR 1.5 (H) 11/02/2020   INR 1.2 08/02/2020    Radiology No results found.   Assessment/Plan 1. ESRD on dialysis [facility]) Recommend:  The patient is doing well and currently has adequate dialysis access. The patient's dialysis center is not reporting any access issues. Flow pattern is stable when compared to the prior ultrasound.  The patient should have a duplex ultrasound of the dialysis access in 6 months. The patient will follow-up with me in the office after each ultrasound    - VAS [place] (AVF, AVG); Future  2. Pain in joint of right shoulder Patient appears to have significant disability of her right shoulder which is causing her severe pain.  Clearly she needs to be evaluated by a shoulder specialist and she wishes to see Dr. Benson Setting  - Ambulatory referral to Orthopedic Surgery  3. Complication from renal dialysis device, sequela See #1  4. Benign essential HTN Continue antihypertensive medications as already ordered, these  medications have been reviewed and there are no changes at this time.   5. Diastolic dysfunction with chronic heart failure (HCC) Continue cardiac and antihypertensive medications as already ordered and reviewed, no changes at this time.  Continue statin as ordered and reviewed, no changes at this time  Nitrates PRN for chest pain   6. Chronic obstructive pulmonary disease, unspecified COPD type ([place]) Continue pulmonary medications and aerosols as already ordered, these medications have been reviewed and there are no changes at this time.     Hortencia Pilar, MD  05/25/2021 10:14 PM

## 2021-06-09 ENCOUNTER — Other Ambulatory Visit: Payer: Self-pay | Admitting: Nephrology

## 2021-06-09 ENCOUNTER — Telehealth (INDEPENDENT_AMBULATORY_CARE_PROVIDER_SITE_OTHER): Payer: Self-pay | Admitting: Vascular Surgery

## 2021-06-09 DIAGNOSIS — R188 Other ascites: Secondary | ICD-10-CM

## 2021-06-09 NOTE — Telephone Encounter (Signed)
Per patient Dr. Delana Meyer referred her to Ernest, she was checking to see if he has put in a referral for her to be seen.  Patient couldn't remember the name of the doctor she would be referred too.  Please advise.

## 2021-06-09 NOTE — Telephone Encounter (Signed)
Patient has been made aware with referring doctor information

## 2021-06-13 ENCOUNTER — Other Ambulatory Visit: Payer: Self-pay | Admitting: Nephrology

## 2021-06-13 ENCOUNTER — Other Ambulatory Visit: Payer: Self-pay

## 2021-06-13 ENCOUNTER — Ambulatory Visit
Admission: RE | Admit: 2021-06-13 | Discharge: 2021-06-13 | Disposition: A | Discharge status: Home / Self Care | Payer: Medicare Other | Source: Ambulatory Visit | Attending: Nephrology | Admitting: Nephrology

## 2021-06-13 DIAGNOSIS — R188 Other ascites: Secondary | ICD-10-CM

## 2021-06-13 NOTE — Telephone Encounter (Signed)
Patient called to check to see if referral had been sent to Dr. Nicholaus Bloom office. I made her aware that referral had been sent. She stated that office does not have referral and asked me to fax referral over. I called office to get fax # and sent referral over on patients' behalf. (Fax went through) Referral was also in the system by GS. This note is for documentation purposes only.

## 2021-06-29 ENCOUNTER — Telehealth: Payer: Self-pay | Admitting: *Deleted

## 2021-06-29 NOTE — Telephone Encounter (Signed)
-----   Message from Rise Mu, PA-C sent at 06/26/2021  7:11 AM EDT ----- Monitor showed a predominant rhythm of sinus with an average rate of 76 bpm (range 57 to 130 bpm), 1 run of SVT (fast rhythm coming from the top portion of the heart) which lasted just 5 beats.  Rare extra beats from the top and bottom portions of the heart.  Patient triggered events were not associated with significant arrhythmia.  Overall, reassuring heart monitor.  No indication for escalation of medical therapy at this time.  Follow-up as previously planned.

## 2021-06-29 NOTE — Telephone Encounter (Signed)
No answer. No voicemail. 

## 2021-06-29 NOTE — Telephone Encounter (Signed)
Spoke with patient. Reviewed results and recommendations with patient. She verbalized understanding with no further questions at this time.

## 2021-07-02 NOTE — Progress Notes (Deleted)
Cardiology Office Note    Date:  07/02/2021   ID:  Janet Olson, DOB [birth year], MRN [id number]  PCP:  Remi Haggard, FNP  Cardiologist:  Ida Rogue, MD  Electrophysiologist:  None   Chief Complaint: Follow up  History of Present Illness:   STEPHAINE Olson is a 51 y.o. female with history of HFpEF, pulmonary hypertension, pSVT, ESRD on HD, COPD with ongoing tobacco use, ascites requiring intermittent paracentesis, HTN, anemia of chronic disease, GI bleeding related to hemorrhoids, GERD, and medical nonadherence who presents for follow up of recent Zio patch.    She was previously evaluated by Dr. Rockey Situ in 12/2017 for concern of abnormal echo and diastolic dysfunction. It was felt that if she was having issues with volume, her dry weight may need to be lowered with HD.  Repeat echo in 12/2018, again showed normal LV function with an EF of 55-60%, mildly to moderately dilated RV with mild reduction in RV systolic function and moderate elevation of pulmonary pressures.  She was subsequently seen by [facility] with recommendation for continuing current medications and smoking cessation.     She was admitted in 01/2020 with volume overload in the setting of missing HD. She was noted to have ascites that required paracentesis with 2 L of fluid removed. Echo in 02/2020 showed an EF of 50-55%, no RWMA, interventricular septal flattening in diastole consistent with volume overload, moderately reduced RV systolic function with a mildly enlarged RV cavity size, moderately elevated PASP, moderately dilated pulmonary artery, and an estimated right atrial pressure of 15 mmHg. She was readmitted in 02/2020 with recurrent volume overload in the setting of missing HD and again required paracentesis. She was admitted in 10/2020 with hypervolemia. Echo showed an EF of 55-60%, no RWMA, mild LVH, normal RVSF, mildly enlarged RV cavity size, severely elevated PASP at 65.7 mmHg, mildly to moderately  dilated left atrium, moderately dilated right atrium, mild mitral,, and an estimated right atrial pressure of 15 mmHg. We were asked to perform a TEE due to enterococcus bacteremia, though this was not able to be performed secondary to the patient leaving AMA prior to completion of imaging study. Since that admission, she has required several outpatient paracenteses coordinated through nephrology, most recently in 01/2021 with 3.8 L of fluid removed.    Notes from PCP indicate she underwent echo at her PCPs office on 01/09/2021 which report indicates showed an EF of 65%, LVH, dilated left atrium, severely dilated right atrium, mitral regurgitation, estimated RVSP 29 mmHg, normal size aortic root, and no evidence of a pericardial effusion.  Carotid artery ultrasound from 01/09/2021 indicates 60 to 79% stenosis of the RICA and 40 to 59% stenosis of the LICA.  She was last seen in the office in 04/2021, and continued to drink large quantities of liquids and was not watching her salt intake. She did not intermittent palpitations over the preceding month with subsequent Zio patch showing a predominant rhythm of sinus with an average heart rate of 76 bpm (range 57-130 bpm), 1 run of SVT/atrial tachycardia lasting 5 beats with a max rate of 130 bpm, rare PACs/PVCs. Patient triggered events were not associated with significant arrhythmia.   She has not required a paracentesis since 01/2021.   ***     Labs independently reviewed: 04/2021 - direct LDL 43, TC 121, TG 49, HDL 70, albumin 3.9, AST/ALT normal, TSH normal 10/2020 - HGB 8.5, PLT 232, potassium 3.4, BUN 32, SCr 5.08, magnesium 1.6  Past Medical History:  Diagnosis Date   (HFpEF) heart failure with preserved ejection fraction ([place])    a. 2015 Echo: Nl EF. Mild to mod LVH. Mild PR/MR; b. 04/2017 Echo: EF 55-60%, no rwma, Gr1 DD. Mildly dil LA. Small effusion; c. 12/2018 Echo: EF 55-60%, mildly dil LA/PA. Mod dil RA. Mild to mod RV dil. Mildly reduced RV  fxn. Mild TR. Mod elev PASP.   Abdominal ascites    Acute hypoxemic respiratory failure ([place]) 01/03/2019   Acute renal failure (ARF) ([place]) 05/17/2017   Acute respiratory failure ([place]) 09/25/2018   Anemia    Anxiety    Arthritis    Asthma    Cardiomegaly    COPD (chronic obstructive pulmonary disease) (HCC)    Depression    Dialysis patient ([place])    MON, WED , FRI   Dyspnea    ESRD (end stage renal disease) (HCC)    GERD (gastroesophageal reflux disease)    Headache    Migraines   Hypertension    Nonrheumatic tricuspid (valve) insufficiency    Respiratory failure with hypoxia ([place]) 04/21/2018   Ulcer    gastric   Vitamin D deficiency     Past Surgical History:  Procedure Laterality Date   A/V FISTULAGRAM Left 04/17/2018   Procedure: A/V FISTULAGRAM;  Surgeon: Algernon Huxley, MD;  Location: [place] [facility];  Service: Cardiovascular;  Laterality: Left;   A/V FISTULAGRAM Left 05/27/2018   Procedure: A/V FISTULAGRAM;  Surgeon: Katha Cabal, MD;  Location: [place] [facility];  Service: Cardiovascular;  Laterality: Left;   A/V FISTULAGRAM Left 07/05/2020   Procedure: A/V FISTULAGRAM;  Surgeon: Katha Cabal, MD;  Location: [place] [facility];  Service: Cardiovascular;  Laterality: Left;   AV FISTULA PLACEMENT Left 08/08/2017   Procedure: ARTERIOVENOUS (AV) FISTULA CREATION ( BRACHIOCEPHALIC );  Surgeon: Algernon Huxley, MD;  Location: [facility];  Service: Vascular;  Laterality: Left;   AV FISTULA PLACEMENT Left 08/05/2020   Procedure: INSERTION OF ARTERIOVENOUS (AV) GRAFT ARM(BRACHIAL AXILLARY);  Surgeon: Katha Cabal, MD;  Location: [facility];  Service: Vascular;  Laterality: Left;   CESAREAN SECTION     x 4 (1988, 1991, 1997, 2000 )   COLONOSCOPY WITH PROPOFOL N/A 04/24/2018   Procedure: COLONOSCOPY WITH PROPOFOL;  Surgeon: Lin Landsman, MD;  Location: [facility];  Service: Gastroenterology;  Laterality: N/A;   DIALYSIS/PERMA CATHETER INSERTION N/A 07/16/2017    Procedure: Dialysis/Perma Catheter Insertion;  Surgeon: Katha Cabal, MD;  Location: [place] [facility];  Service: Cardiovascular;  Laterality: N/A;   DIALYSIS/PERMA CATHETER REMOVAL N/A 10/29/2017   Procedure: DIALYSIS/PERMA CATHETER REMOVAL;  Surgeon: Katha Cabal, MD;  Location: [place] [facility];  Service: Cardiovascular;  Laterality: N/A;   DIALYSIS/PERMA CATHETER REMOVAL N/A 11/03/2020   Procedure: DIALYSIS/PERMA CATHETER REMOVAL;  Surgeon: Algernon Huxley, MD;  Location: [place] [facility];  Service: Cardiovascular;  Laterality: N/A;   excision of lymph nodes Bilateral 2014   bilateral under arms.   PARATHYROID IMPLANT REMOVAL     REMOVAL OF A DIALYSIS CATHETER Left 08/05/2020   Procedure: REMOVAL OF A DIALYSIS CATHETER ( FISTULA );  Surgeon: Katha Cabal, MD;  Location: [facility];  Service: Vascular;  Laterality: Left;    Current Medications: No outpatient medications have been marked as taking for the 07/04/21 encounter (Appointment) with Rise Mu, PA-C.    Allergies:   Patient has no known allergies.   Social History   Socioeconomic  History   Marital status: Single    Spouse name: Not on file   Number of children: Not on file   Years of education: Not on file   Highest education level: Not on file  Occupational History   Not on file  Tobacco Use   Smoking status: Every Day    Packs/day: 0.50    Years: 30.00    Pack years: 15.00    Types: Cigarettes   Smokeless tobacco: Never  Vaping Use   Vaping Use: Never used  Substance and Sexual Activity   Alcohol use: No   Drug use: No   Sexual activity: Not Currently  Other Topics Concern   Not on file  Social History Narrative   Not on file   Social Determinants of Health   Financial Resource Strain: Not on file  Food Insecurity: Not on file  Transportation Needs: Not on file  Physical Activity: Not on file  Stress: Not on file  Social Connections: Not on file     Family History:  The  patient's family history includes Diabetes in her mother.  ROS:   ROS   EKGs/Labs/Other Studies Reviewed:    Studies reviewed were summarized above. The additional studies were reviewed today:  Zio patch 04/2021: Predominant rhythm of sinus with an average heart rate of 76 bpm (range 57-130 bpm), 1 run of SVT/atrial tachycardia lasting 5 beats with a max rate of 130 bpm, rare PACs/PVCs.  __________  2D echo (outside office) 12/2020: EF 65%, LVH, dilated left atrium, severely dilated right atrium, MR, RVSP normal __________  2D echo 10/2020: 1. Left ventricular ejection fraction, by estimation, is 55 to 60%. Left  ventricular ejection fraction by PLAX is 60 %. The left ventricle has  normal function. The left ventricle has no regional wall motion  abnormalities. There is mild left ventricular  hypertrophy. Left ventricular diastolic parameters are indeterminate.   2. Right ventricular systolic function is normal. The right ventricular  size is mildly enlarged. There is severely elevated pulmonary artery  systolic pressure.   3. Left atrial size was mild to moderately dilated.   4. Right atrial size was moderately dilated.   5. The mitral valve is normal in structure. Mild mitral valve  regurgitation.   6. The aortic valve is normal in structure. Aortic valve regurgitation is  not visualized.   7. The inferior vena cava is dilated in size with <50% respiratory  variability, suggesting right atrial pressure of 15 mmHg. __________   2D echo 02/2020: 1. Left ventricular ejection fraction, by estimation, is 50 to 55%. The  left ventricle has low normal function. The left ventricle has no regional  wall motion abnormalities. There is moderate left ventricular hypertrophy.  Left ventricular diastolic  parameters are indeterminate. There is the interventricular septum is  flattened in diastole ('D' shaped left ventricle), consistent with right  ventricular volume overload.   2. Right  ventricular systolic function is moderately reduced. The right  ventricular size is mildly enlarged. There is moderately elevated  pulmonary artery systolic pressure.   3. Left atrial size was mildly dilated.   4. Right atrial size was moderately dilated.   5. Acites is present.. The pericardial effusion is posterior to the left  ventricle.   6. The mitral valve is myxomatous. Trivial mitral valve regurgitation.   7. The aortic valve is tricuspid. Aortic valve regurgitation is not  visualized. No aortic stenosis is present.   8. Moderately dilated pulmonary artery.  9. The inferior vena cava is dilated in size with <50% respiratory  variability, suggesting right atrial pressure of 15 mmHg. ___________   2D echo 04/2017: - Left ventricle: The cavity size was normal. There was mild    concentric hypertrophy. Systolic function was normal. The    estimated ejection fraction was in the range of 55% to 60%. Wall    motion was normal; there were no regional wall motion    abnormalities. Doppler parameters are consistent with abnormal    left ventricular relaxation (grade 1 diastolic dysfunction).  - Left atrium: The atrium was mildly dilated.  - Pericardium, extracardiac: A small pericardial effusion was    identified circumferential to the heart and along the right    atrial free wall. There was no evidence of hemodynamic    compromise.   EKG:  EKG is ordered today.  The EKG ordered today demonstrates ***  Recent Labs: 11/02/2020: B Natriuretic Peptide >4,500.0; Magnesium 1.6 11/05/2020: BUN 32; Creatinine, Ser 5.08; Hemoglobin 8.2; Platelets 137; Potassium 3.8; Sodium 132 05/19/2021: ALT 23; TSH 1.770  Recent Lipid Panel    Component Value Date/Time   CHOL 121 05/19/2021 0932   TRIG 49 05/19/2021 0932   HDL 70 05/19/2021 0932   CHOLHDL 1.7 05/19/2021 0932   CHOLHDL 3.1 05/17/2017 1436   VLDL 39 05/17/2017 1436   LDLCALC 39 05/19/2021 0932   LDLDIRECT 43 05/19/2021 0932     PHYSICAL EXAM:    VS:  LMP 06/10/2018 (Approximate)   BMI: There is no height or weight on file to calculate BMI.  Physical Exam  Wt Readings from Last 3 Encounters:  05/25/21 188 lb (85.3 kg)  05/19/21 185 lb (83.9 kg)  02/28/21 182 lb (82.6 kg)     ASSESSMENT & PLAN:   Paroxysmal SVT: Recent Zio patch showed only 1 episode of SVT lasting 5 beats in duration and was not associated with a patient triggered event.  *** HFpEF with severe pulmonary hypertension:  Carotid artery disease:  ESRD:  Recurrent ascites:  HTN: Blood pressure ***  Anemia of chronic disease:  COPD:  Medical/dietary noncompliance:  Disposition: F/u with Dr. Rockey Situ or an APP in ***.   Medication Adjustments/Labs and Tests Ordered: Current medicines are reviewed at length with the patient today.  Concerns regarding medicines are outlined above. Medication changes, Labs and Tests ordered today are summarized above and listed in the Patient Instructions accessible in Encounters.   Signed, Christell Faith, PA-C 07/02/2021 2:32 PM     [facility] [address] [place] [address] [place],  [address] [phone]

## 2021-07-04 ENCOUNTER — Ambulatory Visit: Payer: Medicare Other | Admitting: Physician Assistant

## 2021-07-17 ENCOUNTER — Emergency Department: Payer: Medicare Other

## 2021-07-17 ENCOUNTER — Emergency Department
Admission: EM | Admit: 2021-07-17 | Discharge: 2021-07-17 | Disposition: A | Discharge status: Home / Self Care | Payer: Medicare Other | Attending: Emergency Medicine | Admitting: Emergency Medicine

## 2021-07-17 ENCOUNTER — Other Ambulatory Visit: Payer: Self-pay

## 2021-07-17 DIAGNOSIS — G4486 Cervicogenic headache: Secondary | ICD-10-CM

## 2021-07-17 DIAGNOSIS — N186 End stage renal disease: Secondary | ICD-10-CM | POA: Insufficient documentation

## 2021-07-17 DIAGNOSIS — Z992 Dependence on renal dialysis: Secondary | ICD-10-CM | POA: Diagnosis not present

## 2021-07-17 DIAGNOSIS — M542 Cervicalgia: Secondary | ICD-10-CM

## 2021-07-17 DIAGNOSIS — I82C21 Chronic embolism and thrombosis of right internal jugular vein: Secondary | ICD-10-CM | POA: Insufficient documentation

## 2021-07-17 DIAGNOSIS — F17210 Nicotine dependence, cigarettes, uncomplicated: Secondary | ICD-10-CM | POA: Diagnosis not present

## 2021-07-17 DIAGNOSIS — I5033 Acute on chronic diastolic (congestive) heart failure: Secondary | ICD-10-CM | POA: Diagnosis not present

## 2021-07-17 DIAGNOSIS — J439 Emphysema, unspecified: Secondary | ICD-10-CM | POA: Diagnosis not present

## 2021-07-17 DIAGNOSIS — Z79899 Other long term (current) drug therapy: Secondary | ICD-10-CM | POA: Insufficient documentation

## 2021-07-17 DIAGNOSIS — G8929 Other chronic pain: Secondary | ICD-10-CM | POA: Diagnosis not present

## 2021-07-17 DIAGNOSIS — M25511 Pain in right shoulder: Secondary | ICD-10-CM | POA: Diagnosis present

## 2021-07-17 DIAGNOSIS — J45909 Unspecified asthma, uncomplicated: Secondary | ICD-10-CM | POA: Insufficient documentation

## 2021-07-17 DIAGNOSIS — W208XXA Other cause of strike by thrown, projected or falling object, initial encounter: Secondary | ICD-10-CM | POA: Insufficient documentation

## 2021-07-17 DIAGNOSIS — I132 Hypertensive heart and chronic kidney disease with heart failure and with stage 5 chronic kidney disease, or end stage renal disease: Secondary | ICD-10-CM | POA: Diagnosis not present

## 2021-07-17 MED ORDER — LIDOCAINE 5 % EX PTCH
1.0000 | MEDICATED_PATCH | CUTANEOUS | Status: DC
  Administered 2021-07-17: 1 via TRANSDERMAL
  Filled 2021-07-17: qty 1

## 2021-07-17 MED ORDER — LIDOCAINE 4 % EX PTCH: 1.0000 | MEDICATED_PATCH | Freq: Two times a day (BID) | CUTANEOUS | 0 refills | Status: DC

## 2021-07-17 NOTE — ED Triage Notes (Signed)
Pt states she was about to get dialysis this morning and the TV fell down striking her in the head , pt c/o right shoulder and head pain, denies LOC. Pt is ambulatory with a steady gait.

## 2021-07-17 NOTE — ED Notes (Signed)
See triage note  Presents with pain to right shoulder and head  States TV fell   Hitting her in the head and shoulder  No LOC  No deformity noted

## 2021-07-17 NOTE — ED Provider Notes (Signed)
[facility] Emergency Department Provider Note  ____________________________________________   Event Date/Time   First MD Initiated Contact with Patient 07/17/21 0818     (approximate)  I have reviewed the triage vital signs and the nursing notes.   HISTORY  Chief Complaint Head Injury and Shoulder Pain   HPI Janet Olson is a 51 y.o. female with PMH for CHF, CKD on dialysis, COPD who reports to the ER for evaluation of headache, neck pain and right shoulder pain. Patient states she missed her dialysis on Friday and received it on Saturday instead. While there, she was trying to use the TV at the facility when it fell off of the swing arm and hit her in the top of the head and then on the right shoulder. She states chronic intermittent headaches and chronic R shoulder pain prior to this, but worsening since that time. She denies any syncope, LOC, vomiting since the head injury. She denies any blurred vision, dizziness, weakness. Pain is worse with movement of the neck and shoulder, not improved with OTC tylenol. Has not tried anything else due to her CKD. Patient states she has a separate lump on the left side of her neck that is unchanged, and is already undergoing PCP work up.        Past Medical History:  Diagnosis Date   (HFpEF) heart failure with preserved ejection fraction ([place])    a. 2015 Echo: Nl EF. Mild to mod LVH. Mild PR/MR; b. 04/2017 Echo: EF 55-60%, no rwma, Gr1 DD. Mildly dil LA. Small effusion; c. 12/2018 Echo: EF 55-60%, mildly dil LA/PA. Mod dil RA. Mild to mod RV dil. Mildly reduced RV fxn. Mild TR. Mod elev PASP.   Abdominal ascites    Acute hypoxemic respiratory failure ([place]) 01/03/2019   Acute renal failure (ARF) ([place]) 05/17/2017   Acute respiratory failure ([place]) 09/25/2018   Anemia    Anxiety    Arthritis    Asthma    Cardiomegaly    COPD (chronic obstructive pulmonary disease) (HCC)    Depression    Dialysis patient ([place])    MON,  WED , FRI   Dyspnea    ESRD (end stage renal disease) ([place])    GERD (gastroesophageal reflux disease)    Headache    Migraines   Hypertension    Nonrheumatic tricuspid (valve) insufficiency    Respiratory failure with hypoxia ([place]) 04/21/2018   Ulcer    gastric   Vitamin D deficiency     Patient Active Problem List   Diagnosis Date Noted   Pain in joint, shoulder region 05/25/2021   Diarrhea 11/07/2020   Benign essential HTN 11/07/2020   Anxiety 11/07/2020   Depression 11/07/2020   GERD (gastroesophageal reflux disease) 11/07/2020   Infection of hemodialysis tunneled catheter ([place]) 11/07/2020   Weight loss 11/07/2020   Other ascites 11/07/2020   Ear mass, left 11/07/2020   Epigastric pain 11/07/2020   Enterococcus faecalis infection 11/03/2020   Sepsis ([place]) 11/03/2020   Right upper quadrant abdominal pain 11/02/2020   Mass of ear canal, left 11/02/2020   Abdominal ascites    Acute on chronic diastolic CHF (congestive heart failure) ([place]) 03/03/2020   Anasarca associated with disorder of kidney 03/03/2020   Hyponatremia with excess extracellular fluid volume 03/03/2020   Hyperkalemia 01/02/7252   Diastolic dysfunction with chronic heart failure ([place]) 04/28/2019   Nonrheumatic tricuspid valve regurgitation 04/28/2019   Tobacco use 04/28/2019   Pericardial effusion 01/26/2019   Shortness of  breath 01/26/2019   Altered mental status 12/04/2018   Renal dialysis device, implant, or graft complication 99/24/2683   ESRD on dialysis ([place]) 08/02/2017   COPD (chronic obstructive pulmonary disease) ([place]) 06/20/2017   Anemia in chronic kidney disease 06/07/2017   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   Mass of left axilla 12/07/2013    Past Surgical History:  Procedure Laterality Date   A/V FISTULAGRAM Left 04/17/2018   Procedure: A/V FISTULAGRAM;  Surgeon: Algernon Huxley, MD;  Location: [place] [facility];  Service: Cardiovascular;  Laterality: Left;   A/V FISTULAGRAM  Left 05/27/2018   Procedure: A/V FISTULAGRAM;  Surgeon: Katha Cabal, MD;  Location: [place] [facility];  Service: Cardiovascular;  Laterality: Left;   A/V FISTULAGRAM Left 07/05/2020   Procedure: A/V FISTULAGRAM;  Surgeon: Katha Cabal, MD;  Location: [place] [facility];  Service: Cardiovascular;  Laterality: Left;   AV FISTULA PLACEMENT Left 08/08/2017   Procedure: ARTERIOVENOUS (AV) FISTULA CREATION ( BRACHIOCEPHALIC );  Surgeon: Algernon Huxley, MD;  Location: [facility];  Service: Vascular;  Laterality: Left;   AV FISTULA PLACEMENT Left 08/05/2020   Procedure: INSERTION OF ARTERIOVENOUS (AV) GRAFT ARM(BRACHIAL AXILLARY);  Surgeon: Katha Cabal, MD;  Location: [facility];  Service: Vascular;  Laterality: Left;   CESAREAN SECTION     x 4 (1988, 1991, 1997, 2000 )   COLONOSCOPY WITH PROPOFOL N/A 04/24/2018   Procedure: COLONOSCOPY WITH PROPOFOL;  Surgeon: Lin Landsman, MD;  Location: [facility];  Service: Gastroenterology;  Laterality: N/A;   DIALYSIS/PERMA CATHETER INSERTION N/A 07/16/2017   Procedure: Dialysis/Perma Catheter Insertion;  Surgeon: Katha Cabal, MD;  Location: [place] [facility];  Service: Cardiovascular;  Laterality: N/A;   DIALYSIS/PERMA CATHETER REMOVAL N/A 10/29/2017   Procedure: DIALYSIS/PERMA CATHETER REMOVAL;  Surgeon: Katha Cabal, MD;  Location: [place] [facility];  Service: Cardiovascular;  Laterality: N/A;   DIALYSIS/PERMA CATHETER REMOVAL N/A 11/03/2020   Procedure: DIALYSIS/PERMA CATHETER REMOVAL;  Surgeon: Algernon Huxley, MD;  Location: [place] [facility];  Service: Cardiovascular;  Laterality: N/A;   excision of lymph nodes Bilateral 2014   bilateral under arms.   PARATHYROID IMPLANT REMOVAL     REMOVAL OF A DIALYSIS CATHETER Left 08/05/2020   Procedure: REMOVAL OF A DIALYSIS CATHETER ( FISTULA );  Surgeon: Katha Cabal, MD;  Location: [facility];  Service: Vascular;  Laterality: Left;    Prior to Admission medications    Medication Sig Start Date End Date Taking? Authorizing Provider  Lidocaine (HM LIDOCAINE PATCH) 4 % PTCH Apply 1 patch topically 2 (two) times daily. 07/17/21  Yes Marlana Salvage, PA  albuterol (PROVENTIL) (2.5 MG/3ML) 0.083% nebulizer solution Take 2.5 mg by nebulization every 6 (six) hours as needed for wheezing or shortness of breath.     [provider]  albuterol (VENTOLIN HFA) 108 (90 Base) MCG/ACT inhaler Inhale 2 puffs into the lungs every 6 (six) hours as needed for wheezing or shortness of breath.  12/22/19   [provider]  amLODipine (NORVASC) 10 MG tablet Take 1 tablet (10 mg total) by mouth daily. 06/12/17   Doles-Johnson, Teah, NP  benzonatate (TESSALON) 100 MG capsule Take 100 mg by mouth 2 (two) times daily as needed for cough.  08/03/19   [provider]  calcium acetate (PHOSLO) 667 MG capsule Take 2 capsules (1,334 mg total) by mouth 3 (three) times daily with meals. 09/27/18   Loletha Grayer, MD  chlorpheniramine-HYDROcodone (Dent) 10-8 MG/5ML  SUER Take 5 mLs by mouth daily as needed for cough.     [provider]  Cholecalciferol (VITAMIN D3) 125 MCG (5000 UT) CAPS Take 5,000 Units by mouth once a week. 01/04/21   [provider]  clotrimazole (MYCELEX) 10 MG troche Take by mouth. 11/08/20   [provider]  CREON 36000-114000 units CPEP capsule Take by mouth. 12/05/20   [provider]  docusate sodium (COLACE) 100 MG capsule Take 100 mg by mouth daily as needed for moderate constipation.     [provider]  DULERA 200-5 MCG/ACT AERO Inhale 2 puffs into the lungs 2 (two) times daily. 01/16/18   [provider]  DULoxetine (CYMBALTA) 30 MG capsule Take 30 mg by mouth 2 (two) times daily. 02/06/21   [provider]  DULoxetine (CYMBALTA) 60 MG capsule Take 60 mg by mouth 2 (two) times daily. 05/14/21   [provider]  hydrOXYzine (ATARAX/VISTARIL) 25 MG tablet Take 25 mg by mouth  every 8 (eight) hours as needed for itching.  12/21/19   [provider]  lidocaine-prilocaine (EMLA) cream  12/15/20   [provider]  metoprolol tartrate (LOPRESSOR) 50 MG tablet Take 50 mg by mouth 2 (two) times daily. 02/09/20   [provider]  multivitamin (RENA-VIT) TABS tablet Take 1 tablet by mouth daily.  08/23/17   [provider]  neomycin-polymyxin-hydrocortisone (CORTISPORIN) 3.5-10000-1 OTIC suspension PLACE 4 DROPS IN AFFECTED EARS THREE TIMES DAILY UNTIL ITCHING RELIEVED 01/10/21   [provider]  oxyCODONE (OXY IR/ROXICODONE) 5 MG immediate release tablet Take 1 tablet (5 mg total) by mouth every 6 (six) hours as needed for moderate pain or severe pain. 08/25/20   Schnier, Dolores Lory, MD  oxyCODONE-acetaminophen (PERCOCET) 5-325 MG tablet Take 1-2 tablets by mouth every 6 (six) hours as needed for moderate pain or severe pain. 08/05/20 08/05/21  Schnier, Dolores Lory, MD  pantoprazole (PROTONIX) 40 MG tablet Take 1 tablet (40 mg total) by mouth daily. 12/20/18   Schuyler Amor, MD  QUEtiapine (SEROQUEL) 25 MG tablet Take 25 mg by mouth at bedtime as needed (sleep).  05/24/20   [provider]  torsemide (DEMADEX) 100 MG tablet Take 1 tablet (100 mg total) by mouth daily. 09/27/18   Loletha Grayer, MD  Vitamin D, Ergocalciferol, (DRISDOL) 50000 units CAPS capsule Take 50,000 Units by mouth every Sunday.  10/22/17   [provider]    Allergies Patient has no known allergies.  Family History  Problem Relation Age of Onset   Diabetes Mother     Social History Social History   Tobacco Use   Smoking status: Every Day    Packs/day: 0.50    Years: 30.00    Pack years: 15.00    Types: Cigarettes   Smokeless tobacco: Never  Vaping Use   Vaping Use: Never used  Substance Use Topics   Alcohol use: No   Drug use: No    Review of Systems Constitutional: No fever/chills Eyes: No visual changes. ENT: No sore  throat. Cardiovascular: Denies chest pain. Respiratory: Denies shortness of breath. Gastrointestinal: No abdominal pain.  No nausea, no vomiting.  No diarrhea.  No constipation. Genitourinary: Negative for dysuria. Musculoskeletal: +right shoulder pain, + neck pain Skin: Negative for rash. Neurological: + headaches, negative for focal weakness or numbness.  ____________________________________________   PHYSICAL EXAM:  VITAL SIGNS: ED Triage Vitals  Enc Vitals Group     BP 07/17/21 0824 131/72     Pulse  Rate 07/17/21 0824 78     Resp 07/17/21 0824 18     Temp 07/17/21 0824 98.2 F (36.8 C)     Temp Source 07/17/21 0824 Oral     SpO2 07/17/21 0824 96 %     Weight 07/17/21 0831 188 lb 0.8 oz (85.3 kg)     Height 07/17/21 0831 5\' 6"  (1.676 m)     Head Circumference --      Peak Flow --      Pain Score --      Pain Loc --      Pain Edu? --      Excl. in [place]? --    Constitutional: Alert and oriented. Well appearing and in no acute distress. Eyes: Conjunctivae are normal. PERRL. EOMI. Head: Atraumatic. Nose: No congestion/rhinnorhea. Mouth/Throat: Mucous membranes are moist.  Oropharynx non-erythematous. Neck: No stridor. Tenderness to palpation of the midline and paraspinals of the cervical spine. Full ROM but with increased pain. Left sided tender nonmobile mass approx 1cm x 1cm. No surrounding erythema, trachea midline. Cardiovascular: Normal rate, regular rhythm. Grossly normal heart sounds.  Good peripheral circulation. Respiratory: Normal respiratory effort.  No retractions. Lungs CTAB. Gastrointestinal: Soft and nontender. No distention. No abdominal bruits. No CVA tenderness. Musculoskeletal: Diffuse tenderness of the right shoulder. Active ROM present, but with increased pain overhead. Equal and symmetric grip strength. Radial pulse 2+ bilaterally. Neurologic:  Normal speech and language. No gross focal neurologic deficits are appreciated. No gait instability. Skin:   Skin is warm, dry and intact. No rash noted. Psychiatric: Mood and affect are normal. Speech and behavior are normal.  ____________________________________________  RADIOLOGY I, Marlana Salvage, personally viewed and evaluated these images (plain radiographs) as part of my medical decision making, as well as reviewing the written report by the radiologist.  ED provider interpretation:  no acute fracture noted on shoulder xr, see radiology report for CT findings.  Official radiology report(s): DG Shoulder Right  Result Date: 07/17/2021 CLINICAL DATA:  51 year old female status post blunt trauma from falling TV 2 days ago. Fall at dialysis. EXAM: RIGHT SHOULDER - 2+ VIEW COMPARISON:  Chest radiographs 02/02/2019. FINDINGS: Bone mineralization is within normal limits. No glenohumeral joint dislocation. Proximal right humerus appears intact. Right clavicle and scapula appear intact with mild degenerative changes. Visible right ribs appear intact. Visible pulmonary vascular congestion appears similar to the 2020 radiographs. IMPRESSION: No acute fracture or dislocation identified about the right shoulder. Electronically Signed   By: Genevie Ann M.D.   On: 07/17/2021 09:33   CT Head Wo Contrast  Result Date: 07/17/2021 CLINICAL DATA:  51 year old female status post fall at dialysis striking head. Pain. EXAM: CT HEAD WITHOUT CONTRAST TECHNIQUE: Contiguous axial images were obtained from the base of the skull through the vertex without intravenous contrast. COMPARISON:  Head CT 09/23/2014. FINDINGS: Brain: Mild cerebral volume loss since 2015. No midline shift, ventriculomegaly, mass effect, evidence of mass lesion, intracranial hemorrhage or evidence of cortically based acute infarction. Gray-white matter differentiation is within normal limits throughout the brain. Chronic partially empty sella. 6 Vascular: Mild Calcified atherosclerosis at the skull base. No suspicious intracranial vascular hyperdensity.  Skull: Stable and intact. Sinuses/Orbits: Visualized paranasal sinuses and mastoids are stable and well aerated. Other: Visualized orbits and scalp soft tissues are within normal limits. IMPRESSION: No acute traumatic injury identified. Negative for age non contrast CT appearance of the brain. Electronically Signed   By: Genevie Ann M.D.   On: 07/17/2021 09:27  CT Cervical Spine Wo Contrast  Result Date: 07/17/2021 CLINICAL DATA:  51 year old female status post fall at dialysis striking head. Pain. EXAM: CT CERVICAL SPINE WITHOUT CONTRAST TECHNIQUE: Multidetector CT imaging of the cervical spine was performed without intravenous contrast. Multiplanar CT image reconstructions were also generated. COMPARISON:  Head CT today reported separately. FINDINGS: Alignment: Reversal of cervical lordosis. Cervicothoracic junction alignment is within normal limits. Bilateral posterior element alignment is within normal limits. Skull base and vertebrae: Visualized skull base is intact. No atlanto-occipital dissociation. C1 and C2 appear intact and aligned. No acute osseous abnormality identified. Soft tissues and spinal canal: No prevertebral fluid or swelling. No visible canal hematoma. There is curvilinear dystrophic calcification in the lumen of the right internal jugular vein (series 3, image 61). No right carotid space inflammation. Otherwise negative visible noncontrast neck soft tissues. Disc levels:  No age advanced degeneration. Upper chest: Visible upper thoracic levels appear intact. Centrilobular emphysema in the lung apices. Negative noncontrast thoracic inlet. IMPRESSION: 1. No acute traumatic injury identified in the cervical spine. 2. 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 3. Emphysema (ICD10-J43.9). Electronically Signed   By: Genevie Ann M.D.   On: 07/17/2021 09:30     ____________________________________________   INITIAL IMPRESSION / ASSESSMENT AND PLAN / ED COURSE  As  part of my medical decision making, I reviewed the following data within the [employer] notes reviewed and incorporated, Radiograph reviewed, and Notes from prior ED visits        Patient is a 51 yo female who reports to the ER or evaluation of right shoulder pain, neck pain and headache that worsened after a TV fell on it on Saturday. Patient was receiving dialysis, is scheduled for additional dialysis today. Denies chest pain, SOB, weakness or other changes. See HPI. In triage patient has normal vital signs. PE as above.  CT head and neck negative for acute pathology. Noted chronic jugular thrombus, discussed with patient and recommended PCP follow up. XR of right shoulder negative. Discussed pain control with tylenol and lidocaine patches given known CKD. Patient amenable with plan. Return precautions were discussed and she's stable at this time for outpatient follow up.       ____________________________________________   FINAL CLINICAL IMPRESSION(S) / ED DIAGNOSES  Final diagnoses:  Acute pain of right shoulder  Neck pain  Cervicogenic headache     ED Discharge Orders          Ordered    Lidocaine (HM LIDOCAINE PATCH) 4 % PTCH  2 times daily        07/17/21 1001             Note:  This document was prepared using Dragon voice recognition software and may include unintentional dictation errors.    Marlana Salvage, PA 07/17/21 1012    Naaman Plummer, MD 07/18/21 [phone]

## 2021-07-17 NOTE — Discharge Instructions (Addendum)
Please take Tylenol for pain. You may also use the lidocaine patches prescribed. Return to the ER with any worsening. Follow up with primary care as directed.

## 2021-08-01 ENCOUNTER — Ambulatory Visit (INDEPENDENT_AMBULATORY_CARE_PROVIDER_SITE_OTHER): Payer: Medicare Other | Admitting: Cardiovascular Disease

## 2021-08-01 ENCOUNTER — Encounter: Payer: Self-pay | Admitting: Cardiovascular Disease

## 2021-08-01 ENCOUNTER — Other Ambulatory Visit: Payer: Self-pay

## 2021-08-01 VITALS — BP 120/60 | HR 76 | Ht 66.0 in | Wt 188.5 lb

## 2021-08-01 DIAGNOSIS — I6523 Occlusion and stenosis of bilateral carotid arteries: Secondary | ICD-10-CM | POA: Diagnosis not present

## 2021-08-01 DIAGNOSIS — E785 Hyperlipidemia, unspecified: Secondary | ICD-10-CM

## 2021-08-01 DIAGNOSIS — E1169 Type 2 diabetes mellitus with other specified complication: Secondary | ICD-10-CM | POA: Diagnosis not present

## 2021-08-01 DIAGNOSIS — I10 Essential (primary) hypertension: Secondary | ICD-10-CM

## 2021-08-01 MED ORDER — AMLODIPINE BESYLATE 10 MG PO TABS: 10.0000 mg | ORAL_TABLET | Freq: Every day | ORAL | 11 refills | Status: DC

## 2021-08-01 MED ORDER — METOPROLOL TARTRATE 50 MG PO TABS: 50.0000 mg | ORAL_TABLET | Freq: Two times a day (BID) | ORAL | 3 refills | Status: DC

## 2021-08-01 NOTE — Patient Instructions (Addendum)
Medication Instructions:  No changes  If you need a refill on your cardiac medications before your next appointment, please call your pharmacy.   Lab work: No new labs needed  Testing/Procedures: No new testing needed  Follow-Up: At [facility], you and your health needs are our priority.  As part of our continuing mission to provide you with exceptional heart care, we have created designated Provider Care Teams.  These Care Teams include your primary Cardiologist (physician) and Advanced Practice Providers (APPs -  Physician Assistants and Nurse Practitioners) who all work together to provide you with the care you need, when you need it.  You will need a follow up appointment in 12 months  Providers on your designated Care Team:   Christopher Berge, NP Ryan Dunn, PA-C Jacquelyn Visser, PA-C Cadence Furth, PA-C  COVID-19 Vaccine Information can be found at: [email or web address] For questions related to vaccine distribution or appointments, please email [email or web address] or call [phone].    

## 2021-08-01 NOTE — Progress Notes (Signed)
Cardiology Office Note  Date:  08/01/2021   ID:  PACHIA STRUM, DOB 04/01/1970, MRN [id number]  PCP:  Remi Haggard, FNP   Chief Complaint  Patient presents with   6 week follow up     Patient c/o shortness of breath with activity & headaches. Medications reviewed by the patient's bottles.     HPI:  Janet Olson is a 51 year old woman with past medical history of ESRD On HD mon/wed/Fri COPD HTN/renal dz Anemia of chronic dz Asthma Smoker Diabetes  Who presents for f/u of her abnormal echocardiogram, pulmopnary edema  LOV 08/2020  Prior hospitalization records reviewed Hospital 10/2020 paracentesis with 3.2 L fluid removal.   Blood culture returned positive for Enterococcus bacteremia.   started on IV ampicillin.   Tunneled hemodialysis catheter removed on 11/4.   echocardiogram which was negative for endocarditis.   TEE was tentatively scheduled for 11/8. She declined the study, left AMA  Echo 12/2020 done with primary care, normal LV function Unable to review images  In follow-up today tolerating dialysis, Feels euvolemic Takes blood pressure medications after dialysis   Labs reviewed LDL 43 Total chol 121 Iron sat 7 HGB 8.5 in 10/2020 [facility] does labs:   EKG personally reviewed by myself on todays visit Shows normal sinus rhythm rate 76 bpm nonspecific T wave abnormality anterior precordial leads  Other past medical history reviewed Could not tolerate coreg, upset stomach  hospitalized 1/21, records reviewed ascites requiring paracentesis with removal of 2 L fluid.  Patient reports that she has missed several recent dialysis (3 in total) as she was feeling sick.  Hospital 02/2020, records reviewed Secondary to volume overload after missing several recent dialysis sessions. paracentesis with 7.5 L clear ascites fluid removed  Echo 02/2020 results reviewed with her  1. Left ventricular ejection fraction, by estimation, is 50 to 55%. The  left ventricle  has low normal function. The left ventricle has no regional  wall motion abnormalities. There is moderate left ventricular hypertrophy.  -- interventricular septum is  flattened in diastole ('D' shaped left ventricle), consistent with right  ventricular volume overload.   --Right ventricular systolic function is moderately reduced. The right  ventricular size is mildly enlarged. There is moderately elevated  pulmonary artery systolic pressure.  --Acites is present.. The pericardial effusion is posterior to the left  ventricle.   Moderately dilated pulmonary artery.    The inferior vena cava is dilated  right atrial pressure of 15 mmHg.    PMH:   has a past medical history of (HFpEF) heart failure with preserved ejection fraction ([place]), Abdominal ascites, Acute hypoxemic respiratory failure ([place]) (01/03/2019), Acute renal failure (ARF) ([place]) (05/17/2017), Acute respiratory failure ([place]) (09/25/2018), Anemia, Anxiety, Arthritis, Asthma, Cardiomegaly, COPD (chronic obstructive pulmonary disease) ([place]), Depression, Dialysis patient ([place]), Dyspnea, ESRD (end stage renal disease) ([place]), GERD (gastroesophageal reflux disease), Headache, Hypertension, Nonrheumatic tricuspid (valve) insufficiency, Respiratory failure with hypoxia ([place]) (04/21/2018), Ulcer, and Vitamin D deficiency.  PSH:    Past Surgical History:  Procedure Laterality Date   A/V FISTULAGRAM Left 04/17/2018   Procedure: A/V FISTULAGRAM;  Surgeon: Algernon Huxley, MD;  Location: [place] [facility];  Service: Cardiovascular;  Laterality: Left;   A/V FISTULAGRAM Left 05/27/2018   Procedure: A/V FISTULAGRAM;  Surgeon: Katha Cabal, MD;  Location: [place] [facility];  Service: Cardiovascular;  Laterality: Left;   A/V FISTULAGRAM Left 07/05/2020   Procedure: A/V FISTULAGRAM;  Surgeon: Katha Cabal, MD;  Location: [place] [facility];  Service: Cardiovascular;  Laterality: Left;   AV FISTULA PLACEMENT Left 08/08/2017   Procedure:  ARTERIOVENOUS (AV) FISTULA CREATION ( BRACHIOCEPHALIC );  Surgeon: Algernon Huxley, MD;  Location: [facility];  Service: Vascular;  Laterality: Left;   AV FISTULA PLACEMENT Left 08/05/2020   Procedure: INSERTION OF ARTERIOVENOUS (AV) GRAFT ARM(BRACHIAL AXILLARY);  Surgeon: Katha Cabal, MD;  Location: [facility];  Service: Vascular;  Laterality: Left;   CESAREAN SECTION     x 4 (1988, 1991, 1997, 2000 )   COLONOSCOPY WITH PROPOFOL N/A 04/24/2018   Procedure: COLONOSCOPY WITH PROPOFOL;  Surgeon: Lin Landsman, MD;  Location: [facility];  Service: Gastroenterology;  Laterality: N/A;   DIALYSIS/PERMA CATHETER INSERTION N/A 07/16/2017   Procedure: Dialysis/Perma Catheter Insertion;  Surgeon: Katha Cabal, MD;  Location: [place] [facility];  Service: Cardiovascular;  Laterality: N/A;   DIALYSIS/PERMA CATHETER REMOVAL N/A 10/29/2017   Procedure: DIALYSIS/PERMA CATHETER REMOVAL;  Surgeon: Katha Cabal, MD;  Location: [place] [facility];  Service: Cardiovascular;  Laterality: N/A;   DIALYSIS/PERMA CATHETER REMOVAL N/A 11/03/2020   Procedure: DIALYSIS/PERMA CATHETER REMOVAL;  Surgeon: Algernon Huxley, MD;  Location: [place] [facility];  Service: Cardiovascular;  Laterality: N/A;   excision of lymph nodes Bilateral 2014   bilateral under arms.   PARATHYROID IMPLANT REMOVAL     REMOVAL OF A DIALYSIS CATHETER Left 08/05/2020   Procedure: REMOVAL OF A DIALYSIS CATHETER ( FISTULA );  Surgeon: Katha Cabal, MD;  Location: [facility];  Service: Vascular;  Laterality: Left;    Current Outpatient Medications  Medication Sig Dispense Refill   albuterol (PROVENTIL) (2.5 MG/3ML) 0.083% nebulizer solution Take 2.5 mg by nebulization every 6 (six) hours as needed for wheezing or shortness of breath.      albuterol (VENTOLIN HFA) 108 (90 Base) MCG/ACT inhaler Inhale 2 puffs into the lungs every 6 (six) hours as needed for wheezing or shortness of breath.      amLODipine (NORVASC) 10 MG tablet  Take 1 tablet (10 mg total) by mouth daily. 30 tablet 3   benzonatate (TESSALON) 100 MG capsule Take 100 mg by mouth 2 (two) times daily as needed for cough.      calcium acetate (PHOSLO) 667 MG capsule Take 2 capsules (1,334 mg total) by mouth 3 (three) times daily with meals. 180 capsule 0   chlorpheniramine-HYDROcodone (TUSSIONEX) 10-8 MG/5ML SUER Take 5 mLs by mouth daily as needed for cough.      clotrimazole (MYCELEX) 10 MG troche Take by mouth.     CREON 36000-114000 units CPEP capsule Take by mouth.     docusate sodium (COLACE) 100 MG capsule Take 100 mg by mouth daily as needed for moderate constipation.      DULERA 200-5 MCG/ACT AERO Inhale 2 puffs into the lungs 2 (two) times daily.  0   DULoxetine (CYMBALTA) 60 MG capsule Take 60 mg by mouth 2 (two) times daily.     hydrOXYzine (ATARAX/VISTARIL) 25 MG tablet Take 25 mg by mouth every 8 (eight) hours as needed for itching.      metoprolol tartrate (LOPRESSOR) 50 MG tablet Take 50 mg by mouth 2 (two) times daily.     multivitamin (RENA-VIT) TABS tablet Take 1 tablet by mouth daily.   1   neomycin-polymyxin-hydrocortisone (CORTISPORIN) 3.5-10000-1 OTIC suspension PLACE 4 DROPS IN AFFECTED EARS THREE TIMES DAILY UNTIL ITCHING RELIEVED     oxyCODONE (OXY IR/ROXICODONE) 5 MG immediate release tablet Take 1 tablet (5 mg total)  by mouth every 6 (six) hours as needed for moderate pain or severe pain. 30 tablet 0   oxyCODONE-acetaminophen (PERCOCET) 5-325 MG tablet Take 1-2 tablets by mouth every 6 (six) hours as needed for moderate pain or severe pain. 36 tablet 0   pantoprazole (PROTONIX) 40 MG tablet Take 1 tablet (40 mg total) by mouth daily. 30 tablet 0   QUEtiapine (SEROQUEL) 25 MG tablet Take 25 mg by mouth at bedtime as needed (sleep).      torsemide (DEMADEX) 100 MG tablet Take 1 tablet (100 mg total) by mouth daily. 30 tablet 0   Vitamin D, Ergocalciferol, (DRISDOL) 50000 units CAPS capsule Take 50,000 Units by mouth every Sunday.   0    DULoxetine (CYMBALTA) 30 MG capsule Take 30 mg by mouth 2 (two) times daily. (Patient not taking: Reported on 08/01/2021)     Lidocaine (HM LIDOCAINE PATCH) 4 % PTCH Apply 1 patch topically 2 (two) times daily. (Patient not taking: Reported on 08/01/2021) 30 patch 0   lidocaine-prilocaine (EMLA) cream  (Patient not taking: Reported on 08/01/2021)     No current facility-administered medications for this visit.     Allergies:   Patient has no known allergies.   Social History:  The patient  reports that she has been smoking cigarettes. She has a 15.00 pack-year smoking history. She has never used smokeless tobacco. She reports that she does not drink alcohol and does not use drugs.   Family History:   family history includes Diabetes in her mother.    Review of Systems: Review of Systems  Constitutional:  Positive for malaise/fatigue.  Respiratory: Negative.    Cardiovascular: Negative.   Gastrointestinal: Negative.   Musculoskeletal: Negative.   Neurological: Negative.   Psychiatric/Behavioral: Negative.    All other systems reviewed and are negative.   PHYSICAL EXAM: VS:  BP 120/60 (BP Location: Left Arm, Patient Position: Sitting, Cuff Size: Normal)   Pulse 76   Ht 5\' 6"  (1.676 m)   Wt 188 lb 8 oz (85.5 kg)   LMP 06/10/2018 (Approximate)   BMI 30.42 kg/m  , BMI Body mass index is 30.42 kg/m. Constitutional:  oriented to person, place, and time. No distress.  HENT:  Head: Grossly normal Eyes:  no discharge. No scleral icterus.  Neck: No JVD, no carotid bruits  Cardiovascular: Regular rate and rhythm, no murmurs appreciated Pulmonary/Chest: Clear to auscultation bilaterally, no wheezes or rails Abdominal: Soft.  no distension.  no tenderness.  Musculoskeletal: Normal range of motion Neurological:  normal muscle tone. Coordination normal. No atrophy Skin: Skin warm and dry Psychiatric: normal affect, pleasant  Recent Labs: 11/02/2020: B Natriuretic Peptide >4,500.0;  Magnesium 1.6 11/05/2020: BUN 32; Creatinine, Ser 5.08; Hemoglobin 8.2; Platelets 137; Potassium 3.8; Sodium 132 05/19/2021: ALT 23; TSH 1.770    Lipid Panel Lab Results  Component Value Date   CHOL 121 05/19/2021   HDL 70 05/19/2021   LDLCALC 39 05/19/2021   TRIG 49 05/19/2021      Wt Readings from Last 3 Encounters:  08/01/21 188 lb 8 oz (85.5 kg)  07/17/21 188 lb 0.8 oz (85.3 kg)  05/25/21 188 lb (85.3 kg)      ASSESSMENT AND PLAN:  ESRD on dialysis [facility]) -  Therapy on Monday Wednesday Friday Denies ascites Reports that she feels euvolemic Weight down 50 pounds since 2019 also contributing  Chronic obstructive pulmonary disease, unspecified COPD type ([place]) - Plan:  Long history of smoking  Smoking cessation recommended Reports breathing stable  Anemia in chronic kidney disease, on chronic dialysis ([place]) - Anemia managed by nephrology, appears stable  benign hypertensive renal disease - Plan: EKG 12-Lead LVH noted on echocardiogram,  Blood pressure is well controlled on today's visit. No changes made to the medications.  Cardiac hypertrophy Secondary to longstanding hypertension  Ascites Fluid management by hemodialysis Prior paracentesis end of 2021   Total encounter time more than 25 minutes  Greater than 50% was spent in counseling and coordination of care with the patient     Signed, Esmond Plants, M.D., Ph.D. 08/01/2021  [facility] [place], [place] [phone]

## 2021-08-03 ENCOUNTER — Other Ambulatory Visit (HOSPITAL_COMMUNITY): Payer: Self-pay | Admitting: Sports Medicine

## 2021-08-03 ENCOUNTER — Other Ambulatory Visit: Payer: Self-pay | Admitting: Sports Medicine

## 2021-08-03 DIAGNOSIS — M75101 Unspecified rotator cuff tear or rupture of right shoulder, not specified as traumatic: Secondary | ICD-10-CM

## 2021-08-03 DIAGNOSIS — M778 Other enthesopathies, not elsewhere classified: Secondary | ICD-10-CM

## 2021-08-03 DIAGNOSIS — M7541 Impingement syndrome of right shoulder: Secondary | ICD-10-CM

## 2021-08-03 DIAGNOSIS — M7551 Bursitis of right shoulder: Secondary | ICD-10-CM

## 2021-08-17 ENCOUNTER — Ambulatory Visit
Admission: RE | Admit: 2021-08-17 | Discharge: 2021-08-17 | Disposition: A | Discharge status: Home / Self Care | Payer: Medicare Other | Source: Ambulatory Visit | Attending: Sports Medicine | Admitting: Sports Medicine

## 2021-08-17 ENCOUNTER — Other Ambulatory Visit: Payer: Self-pay | Admitting: Family Medicine

## 2021-08-17 ENCOUNTER — Other Ambulatory Visit: Payer: Self-pay

## 2021-08-17 DIAGNOSIS — M75101 Unspecified rotator cuff tear or rupture of right shoulder, not specified as traumatic: Secondary | ICD-10-CM | POA: Insufficient documentation

## 2021-08-17 DIAGNOSIS — M7551 Bursitis of right shoulder: Secondary | ICD-10-CM | POA: Insufficient documentation

## 2021-08-17 DIAGNOSIS — M7541 Impingement syndrome of right shoulder: Secondary | ICD-10-CM | POA: Diagnosis present

## 2021-08-17 DIAGNOSIS — I878 Other specified disorders of veins: Secondary | ICD-10-CM

## 2021-08-17 DIAGNOSIS — M778 Other enthesopathies, not elsewhere classified: Secondary | ICD-10-CM | POA: Diagnosis present

## 2021-08-24 ENCOUNTER — Other Ambulatory Visit: Payer: Self-pay

## 2021-08-24 ENCOUNTER — Ambulatory Visit
Admission: RE | Admit: 2021-08-24 | Discharge: 2021-08-24 | Disposition: A | Discharge status: Home / Self Care | Payer: Medicare Other | Source: Ambulatory Visit | Attending: Family Medicine | Admitting: Family Medicine

## 2021-08-24 DIAGNOSIS — I878 Other specified disorders of veins: Secondary | ICD-10-CM | POA: Insufficient documentation

## 2021-10-05 ENCOUNTER — Other Ambulatory Visit: Payer: Self-pay

## 2021-10-05 ENCOUNTER — Encounter: Payer: Self-pay | Admitting: Emergency Medicine

## 2021-10-05 ENCOUNTER — Emergency Department: Payer: Medicare Other

## 2021-10-05 DIAGNOSIS — R109 Unspecified abdominal pain: Secondary | ICD-10-CM | POA: Diagnosis present

## 2021-10-05 DIAGNOSIS — E875 Hyperkalemia: Secondary | ICD-10-CM | POA: Diagnosis not present

## 2021-10-05 DIAGNOSIS — J449 Chronic obstructive pulmonary disease, unspecified: Secondary | ICD-10-CM | POA: Insufficient documentation

## 2021-10-05 DIAGNOSIS — Z992 Dependence on renal dialysis: Secondary | ICD-10-CM | POA: Insufficient documentation

## 2021-10-05 DIAGNOSIS — Z79899 Other long term (current) drug therapy: Secondary | ICD-10-CM | POA: Diagnosis not present

## 2021-10-05 DIAGNOSIS — R188 Other ascites: Secondary | ICD-10-CM | POA: Diagnosis not present

## 2021-10-05 DIAGNOSIS — I5033 Acute on chronic diastolic (congestive) heart failure: Secondary | ICD-10-CM | POA: Insufficient documentation

## 2021-10-05 DIAGNOSIS — K8020 Calculus of gallbladder without cholecystitis without obstruction: Secondary | ICD-10-CM | POA: Insufficient documentation

## 2021-10-05 DIAGNOSIS — R0602 Shortness of breath: Secondary | ICD-10-CM | POA: Diagnosis not present

## 2021-10-05 DIAGNOSIS — N186 End stage renal disease: Secondary | ICD-10-CM | POA: Insufficient documentation

## 2021-10-05 DIAGNOSIS — F17210 Nicotine dependence, cigarettes, uncomplicated: Secondary | ICD-10-CM | POA: Diagnosis not present

## 2021-10-05 DIAGNOSIS — K5730 Diverticulosis of large intestine without perforation or abscess without bleeding: Secondary | ICD-10-CM | POA: Diagnosis not present

## 2021-10-05 DIAGNOSIS — I132 Hypertensive heart and chronic kidney disease with heart failure and with stage 5 chronic kidney disease, or end stage renal disease: Secondary | ICD-10-CM | POA: Insufficient documentation

## 2021-10-05 LAB — CBC
HCT: 29.7 % — ABNORMAL LOW (ref 36.0–46.0)
Hemoglobin: 9.5 g/dL — ABNORMAL LOW (ref 12.0–15.0)
MCH: 27.5 pg (ref 26.0–34.0)
MCHC: 32 g/dL (ref 30.0–36.0)
MCV: 86.1 fL (ref 80.0–100.0)
Platelets: 258 10*3/uL (ref 150–400)
RBC: 3.45 MIL/uL — ABNORMAL LOW (ref 3.87–5.11)
RDW: 18.9 % — ABNORMAL HIGH (ref 11.5–15.5)
WBC: 6.6 10*3/uL (ref 4.0–10.5)
nRBC: 0 % (ref 0.0–0.2)

## 2021-10-05 LAB — COMPREHENSIVE METABOLIC PANEL
ALT: 13 U/L (ref 0–44)
AST: 19 U/L (ref 15–41)
Albumin: 3.5 g/dL (ref 3.5–5.0)
Alkaline Phosphatase: 141 U/L — ABNORMAL HIGH (ref 38–126)
Anion gap: 15 (ref 5–15)
BUN: 70 mg/dL — ABNORMAL HIGH (ref 6–20)
CO2: 22 mmol/L (ref 22–32)
Calcium: 8.5 mg/dL — ABNORMAL LOW (ref 8.9–10.3)
Chloride: 96 mmol/L — ABNORMAL LOW (ref 98–111)
Creatinine, Ser: 9.72 mg/dL — ABNORMAL HIGH (ref 0.44–1.00)
GFR, Estimated: 4 mL/min — ABNORMAL LOW (ref >60)
Glucose, Bld: 99 mg/dL (ref 70–99)
Potassium: 5.7 mmol/L — ABNORMAL HIGH (ref 3.5–5.1)
Sodium: 133 mmol/L — ABNORMAL LOW (ref 135–145)
Total Bilirubin: 0.9 mg/dL (ref 0.3–1.2)
Total Protein: 8 g/dL (ref 6.5–8.1)

## 2021-10-05 LAB — CBG MONITORING, ED: Glucose-Capillary: 89 mg/dL (ref 70–99)

## 2021-10-05 LAB — TROPONIN I (HIGH SENSITIVITY): Troponin I (High Sensitivity): 8 ng/L (ref <18)

## 2021-10-05 NOTE — ED Triage Notes (Signed)
Pt to ED via POV with c/o weakness, decreased appetite, chills, diaphoresis, L and R rib pain. Pt states R shoulder pain from needing rotator cuff surgery. Pt states feels like she can't get a deep breath due to pain. Pt states symptoms ongoing since Monday but have progressively worsened.   Pt states dialysis patient, does dialysis MWF, last dialysis Wednesday.

## 2021-10-06 ENCOUNTER — Emergency Department
Admission: EM | Admit: 2021-10-06 | Discharge: 2021-10-06 | Disposition: A | Discharge status: Home / Self Care | Payer: Medicare Other | Attending: Emergency Medicine | Admitting: Emergency Medicine

## 2021-10-06 ENCOUNTER — Emergency Department: Payer: Medicare Other

## 2021-10-06 DIAGNOSIS — R109 Unspecified abdominal pain: Secondary | ICD-10-CM

## 2021-10-06 DIAGNOSIS — R188 Other ascites: Secondary | ICD-10-CM | POA: Diagnosis not present

## 2021-10-06 DIAGNOSIS — E875 Hyperkalemia: Secondary | ICD-10-CM

## 2021-10-06 LAB — URINALYSIS, COMPLETE (UACMP) WITH MICROSCOPIC
Bilirubin Urine: NEGATIVE
Glucose, UA: NEGATIVE mg/dL
Hgb urine dipstick: NEGATIVE
Ketones, ur: NEGATIVE mg/dL
Nitrite: NEGATIVE
Protein, ur: >=300 mg/dL — AB
Specific Gravity, Urine: 1.012 (ref 1.005–1.030)
pH: 7 (ref 5.0–8.0)

## 2021-10-06 LAB — LIPASE, BLOOD: Lipase: 60 U/L — ABNORMAL HIGH (ref 11–51)

## 2021-10-06 LAB — TROPONIN I (HIGH SENSITIVITY): Troponin I (High Sensitivity): 9 ng/L (ref <18)

## 2021-10-06 LAB — BRAIN NATRIURETIC PEPTIDE: B Natriuretic Peptide: 2822.4 pg/mL — ABNORMAL HIGH (ref 0.0–100.0)

## 2021-10-06 MED ORDER — MORPHINE SULFATE (PF) 4 MG/ML IV SOLN
4.0000 mg | Freq: Once | INTRAVENOUS | Status: AC
  Administered 2021-10-06: 4 mg via INTRAVENOUS
  Filled 2021-10-06: qty 1

## 2021-10-06 MED ORDER — SODIUM ZIRCONIUM CYCLOSILICATE 10 G PO PACK
10.0000 g | PACK | Freq: Once | ORAL | Status: AC
  Administered 2021-10-06: 10 g via ORAL
  Filled 2021-10-06: qty 1

## 2021-10-06 MED ORDER — IOHEXOL 350 MG/ML SOLN
75.0000 mL | Freq: Once | INTRAVENOUS | Status: AC | PRN
  Administered 2021-10-06: 75 mL via INTRAVENOUS

## 2021-10-06 MED ORDER — ONDANSETRON 4 MG PO TBDP: 4.0000 mg | ORAL_TABLET | Freq: Four times a day (QID) | ORAL | 0 refills | Status: DC | PRN

## 2021-10-06 MED ORDER — HYDROCODONE-ACETAMINOPHEN 5-325 MG PO TABS: 2.0000 | ORAL_TABLET | Freq: Four times a day (QID) | ORAL | 0 refills | Status: DC | PRN

## 2021-10-06 MED ORDER — ONDANSETRON HCL 4 MG/2ML IJ SOLN
4.0000 mg | Freq: Once | INTRAMUSCULAR | Status: AC
  Administered 2021-10-06: 4 mg via INTRAVENOUS
  Filled 2021-10-06: qty 2

## 2021-10-06 NOTE — ED Notes (Signed)
Pt was hard stick. Labs was called to drw

## 2021-10-06 NOTE — ED Notes (Signed)
Patient transported to CT 

## 2021-10-06 NOTE — ED Provider Notes (Signed)
[facility] Emergency Department Provider Note  ____________________________________________   Event Date/Time   First MD Initiated Contact with Patient 10/06/21 0008     (approximate)  I have reviewed the triage vital signs and the nursing notes.   HISTORY  Chief Complaint No chief complaint on file.    HPI Janet Olson is a 51 y.o. female with history or CHF, ESRD on HD MWF, COPD, HTN who presents to the emergency department with left-sided flank, chest and abdominal pain.  States symptoms started Monday, October 3.  States she initially thought she pulled a muscle but then pain progressively got worse.  She denies any known injury or increased physical exertion.  She states pain is worse with movement and deep inspiration.  She has had chills, shortness of breath and nausea.  No fevers, cough, vomiting, diarrhea.  States she has been on dialysis for the past 4 years but still makes urine regularly.  Denies history of previous back surgery.  Has had previous epidural injection but last was over 10 years ago.  No history of diabetes, HIV, cancer, IV drug abuse.  Denies numbness, tingling, weakness, bowel or bladder incontinence.  Has been able to ambulate.  She denies any history of PE, DVT.  No calf tenderness or calf swelling.  Denies any missed dialysis appointments.  States she has tramadol at home prescribed by her orthopedic physician for chronic rotator cuff issues but this is not helping control this pain.  She is requesting something stronger.         Past Medical History:  Diagnosis Date   (HFpEF) heart failure with preserved ejection fraction ([place])    a. 2015 Echo: Nl EF. Mild to mod LVH. Mild PR/MR; b. 04/2017 Echo: EF 55-60%, no rwma, Gr1 DD. Mildly dil LA. Small effusion; c. 12/2018 Echo: EF 55-60%, mildly dil LA/PA. Mod dil RA. Mild to mod RV dil. Mildly reduced RV fxn. Mild TR. Mod elev PASP.   Abdominal ascites    Acute hypoxemic respiratory  failure ([place]) 01/03/2019   Acute renal failure (ARF) ([place]) 05/17/2017   Acute respiratory failure ([place]) 09/25/2018   Anemia    Anxiety    Arthritis    Asthma    Cardiomegaly    COPD (chronic obstructive pulmonary disease) (HCC)    Depression    Dialysis patient ([place])    MON, WED , FRI   Dyspnea    ESRD (end stage renal disease) ([place])    GERD (gastroesophageal reflux disease)    Headache    Migraines   Hypertension    Nonrheumatic tricuspid (valve) insufficiency    Respiratory failure with hypoxia ([place]) 04/21/2018   Ulcer    gastric   Vitamin D deficiency     Patient Active Problem List   Diagnosis Date Noted   Pain in joint, shoulder region 05/25/2021   Diarrhea 11/07/2020   Benign essential HTN 11/07/2020   Anxiety 11/07/2020   Depression 11/07/2020   GERD (gastroesophageal reflux disease) 11/07/2020   Infection of hemodialysis tunneled catheter ([place]) 11/07/2020   Weight loss 11/07/2020   Other ascites 11/07/2020   Ear mass, left 11/07/2020   Epigastric pain 11/07/2020   Enterococcus faecalis infection 11/03/2020   Sepsis ([place]) 11/03/2020   Right upper quadrant abdominal pain 11/02/2020   Mass of ear canal, left 11/02/2020   Abdominal ascites    Acute on chronic diastolic CHF (congestive heart failure) ([place]) 03/03/2020   Anasarca associated with disorder of kidney 03/03/2020  Hyponatremia with excess extracellular fluid volume 03/03/2020   Hyperkalemia 95/08/3266   Diastolic dysfunction with chronic heart failure ([place]) 04/28/2019   Nonrheumatic tricuspid valve regurgitation 04/28/2019   Tobacco use 04/28/2019   Pericardial effusion 01/26/2019   Shortness of breath 01/26/2019   Altered mental status 12/04/2018   Renal dialysis device, implant, or graft complication 12/45/8099   ESRD on dialysis ([place]) 08/02/2017   COPD (chronic obstructive pulmonary disease) ([place]) 06/20/2017   Anemia in chronic kidney disease 06/07/2017   Benign hypertensive renal disease  05/23/2017   Mass of left axilla 12/07/2013    Past Surgical History:  Procedure Laterality Date   A/V FISTULAGRAM Left 04/17/2018   Procedure: A/V FISTULAGRAM;  Surgeon: Algernon Huxley, MD;  Location: [place] [facility];  Service: Cardiovascular;  Laterality: Left;   A/V FISTULAGRAM Left 05/27/2018   Procedure: A/V FISTULAGRAM;  Surgeon: Katha Cabal, MD;  Location: [place] [facility];  Service: Cardiovascular;  Laterality: Left;   A/V FISTULAGRAM Left 07/05/2020   Procedure: A/V FISTULAGRAM;  Surgeon: Katha Cabal, MD;  Location: [place] [facility];  Service: Cardiovascular;  Laterality: Left;   AV FISTULA PLACEMENT Left 08/08/2017   Procedure: ARTERIOVENOUS (AV) FISTULA CREATION ( BRACHIOCEPHALIC );  Surgeon: Algernon Huxley, MD;  Location: [facility];  Service: Vascular;  Laterality: Left;   AV FISTULA PLACEMENT Left 08/05/2020   Procedure: INSERTION OF ARTERIOVENOUS (AV) GRAFT ARM(BRACHIAL AXILLARY);  Surgeon: Katha Cabal, MD;  Location: [facility];  Service: Vascular;  Laterality: Left;   CESAREAN SECTION     x 4 (1988, 1991, 1997, 2000 )   COLONOSCOPY WITH PROPOFOL N/A 04/24/2018   Procedure: COLONOSCOPY WITH PROPOFOL;  Surgeon: Lin Landsman, MD;  Location: [facility];  Service: Gastroenterology;  Laterality: N/A;   DIALYSIS/PERMA CATHETER INSERTION N/A 07/16/2017   Procedure: Dialysis/Perma Catheter Insertion;  Surgeon: Katha Cabal, MD;  Location: [place] [facility];  Service: Cardiovascular;  Laterality: N/A;   DIALYSIS/PERMA CATHETER REMOVAL N/A 10/29/2017   Procedure: DIALYSIS/PERMA CATHETER REMOVAL;  Surgeon: Katha Cabal, MD;  Location: [place] [facility];  Service: Cardiovascular;  Laterality: N/A;   DIALYSIS/PERMA CATHETER REMOVAL N/A 11/03/2020   Procedure: DIALYSIS/PERMA CATHETER REMOVAL;  Surgeon: Algernon Huxley, MD;  Location: [place] [facility];  Service: Cardiovascular;  Laterality: N/A;   excision of lymph nodes Bilateral 2014    bilateral under arms.   PARATHYROID IMPLANT REMOVAL     REMOVAL OF A DIALYSIS CATHETER Left 08/05/2020   Procedure: REMOVAL OF A DIALYSIS CATHETER ( FISTULA );  Surgeon: Katha Cabal, MD;  Location: [facility];  Service: Vascular;  Laterality: Left;    Prior to Admission medications   Medication Sig Start Date End Date Taking? Authorizing Provider  HYDROcodone-acetaminophen (NORCO/VICODIN) 5-325 MG tablet Take 2 tablets by mouth every 6 (six) hours as needed. 10/06/21  Yes Jaequan Propes N, DO  ondansetron (ZOFRAN ODT) 4 MG disintegrating tablet Take 1 tablet (4 mg total) by mouth every 6 (six) hours as needed for nausea or vomiting. 10/06/21  Yes Milynn Quirion N, DO  albuterol (PROVENTIL) (2.5 MG/3ML) 0.083% nebulizer solution Take 2.5 mg by nebulization every 6 (six) hours as needed for wheezing or shortness of breath.     [provider]  albuterol (VENTOLIN HFA) 108 (90 Base) MCG/ACT inhaler Inhale 2 puffs into the lungs every 6 (six) hours as needed for wheezing or shortness of breath.  12/22/19   [provider]  amLODipine ([place])  10 MG tablet Take 1 tablet (10 mg total) by mouth daily. 08/01/21   Minna Merritts, MD  benzonatate (TESSALON) 100 MG capsule Take 100 mg by mouth 2 (two) times daily as needed for cough.  08/03/19   [provider]  calcium acetate (PHOSLO) 667 MG capsule Take 2 capsules (1,334 mg total) by mouth 3 (three) times daily with meals. 09/27/18   Loletha Grayer, MD  chlorpheniramine-HYDROcodone (TUSSIONEX) 10-8 MG/5ML SUER Take 5 mLs by mouth daily as needed for cough.     [provider]  clotrimazole (MYCELEX) 10 MG troche Take by mouth. 11/08/20   [provider]  CREON 36000-114000 units CPEP capsule Take by mouth. 12/05/20   [provider]  docusate sodium (COLACE) 100 MG capsule Take 100 mg by mouth daily as needed for moderate constipation.     [provider]  DULERA 200-5 MCG/ACT AERO Inhale 2  puffs into the lungs 2 (two) times daily. 01/16/18   [provider]  DULoxetine (CYMBALTA) 30 MG capsule Take 30 mg by mouth 2 (two) times daily. Patient not taking: Reported on 08/01/2021 02/06/21   [provider]  DULoxetine (CYMBALTA) 60 MG capsule Take 60 mg by mouth 2 (two) times daily. 05/14/21   [provider]  hydrOXYzine (ATARAX/VISTARIL) 25 MG tablet Take 25 mg by mouth every 8 (eight) hours as needed for itching.  12/21/19   [provider]  Lidocaine (HM LIDOCAINE PATCH) 4 % PTCH Apply 1 patch topically 2 (two) times daily. Patient not taking: Reported on 08/01/2021 07/17/21   Marlana Salvage, PA  lidocaine-prilocaine (EMLA) cream  12/15/20   [provider]  metoprolol tartrate (LOPRESSOR) 50 MG tablet Take 1 tablet (50 mg total) by mouth 2 (two) times daily. 08/01/21   Minna Merritts, MD  multivitamin (RENA-VIT) TABS tablet Take 1 tablet by mouth daily.  08/23/17   [provider]  neomycin-polymyxin-hydrocortisone (CORTISPORIN) 3.5-10000-1 OTIC suspension PLACE 4 DROPS IN AFFECTED EARS THREE TIMES DAILY UNTIL ITCHING RELIEVED 01/10/21   [provider]  oxyCODONE (OXY IR/ROXICODONE) 5 MG immediate release tablet Take 1 tablet (5 mg total) by mouth every 6 (six) hours as needed for moderate pain or severe pain. 08/25/20   Schnier, Dolores Lory, MD  pantoprazole (PROTONIX) 40 MG tablet Take 1 tablet (40 mg total) by mouth daily. 12/20/18   Schuyler Amor, MD  QUEtiapine (SEROQUEL) 25 MG tablet Take 25 mg by mouth at bedtime as needed (sleep).  05/24/20   [provider]  torsemide (DEMADEX) 100 MG tablet Take 1 tablet (100 mg total) by mouth daily. 09/27/18   Loletha Grayer, MD  Vitamin D, Ergocalciferol, (DRISDOL) 50000 units CAPS capsule Take 50,000 Units by mouth every Sunday.  10/22/17   [provider]    Allergies Patient has no known allergies.  Family History  Problem Relation Age of Onset    Diabetes Mother     Social History Social History   Tobacco Use   Smoking status: Every Day    Packs/day: 0.50    Years: 30.00    Pack years: 15.00    Types: Cigarettes   Smokeless tobacco: Never  Vaping Use   Vaping Use: Never used  Substance Use Topics   Alcohol use: No   Drug use: No    Review of Systems Constitutional: No fever. Eyes: No visual changes. ENT: No sore throat. Cardiovascular: + chest pain. Respiratory: + shortness of breath. Gastrointestinal: No vomiting, diarrhea. Genitourinary:  Negative for dysuria. Musculoskeletal: + for back pain. Skin: Negative for rash. Neurological: Negative for focal weakness or numbness.  ____________________________________________   PHYSICAL EXAM:  VITAL SIGNS: ED Triage Vitals  Enc Vitals Group     BP 10/05/21 2022 (!) 143/72     Pulse Rate 10/05/21 2022 72     Resp 10/05/21 2022 20     Temp 10/05/21 2022 98.7 F (37.1 C)     Temp Source 10/05/21 2022 Oral     SpO2 10/05/21 2022 97 %     Weight 10/05/21 2020 180 lb (81.6 kg)     Height 10/05/21 2020 5\' 6"  (1.676 m)     Head Circumference --      Peak Flow --      Pain Score 10/05/21 2020 10     Pain Loc --      Pain Edu? --      Excl. in [place]? --    CONSTITUTIONAL: Alert and oriented and responds appropriately to questions.  Chronically ill-appearing, afebrile, in no distress HEAD: Normocephalic EYES: Conjunctivae clear, pupils appear equal, EOM appear intact ENT: normal nose; moist mucous membranes NECK: Supple, normal ROM CARD: RRR; S1 and S2 appreciated; no murmurs, no clicks, no rubs, no gallops RESP: Normal chest excursion without splinting or tachypnea; breath sounds clear and equal bilaterally; no wheezes, no rhonchi, no rales, no hypoxia or respiratory distress, speaking full sentences ABD/GI: Normal bowel sounds; non-distended; soft, tender to palpation in the left upper quadrant, left flank without guarding or rebound BACK: The back appears normal,  no midline spinal tenderness or step-off or deformity, no CVA tenderness, no soft tissue swelling, redness, warmth, ecchymosis, rashes or other lesions EXT: Normal ROM in all joints; no deformity noted, no edema; no cyanosis SKIN: Normal color for age and race; warm; no rash on exposed skin NEURO: Moves all extremities equally, strength 5/5 in all 4 extremities, no saddle anesthesia, normal sensation diffusely, normal speech, no clonus PSYCH: The patient's mood and manner are appropriate.  ____________________________________________   LABS (all labs ordered are listed, but only abnormal results are displayed)  Labs Reviewed  CBC - Abnormal; Notable for the following components:      Result Value   RBC 3.45 (*)    Hemoglobin 9.5 (*)    HCT 29.7 (*)    RDW 18.9 (*)    All other components within normal limits  COMPREHENSIVE METABOLIC PANEL - Abnormal; Notable for the following components:   Sodium 133 (*)    Potassium 5.7 (*)    Chloride 96 (*)    BUN 70 (*)    Creatinine, Ser 9.72 (*)    Calcium 8.5 (*)    Alkaline Phosphatase 141 (*)    GFR, Estimated 4 (*)    All other components within normal limits  LIPASE, BLOOD - Abnormal; Notable for the following components:   Lipase 60 (*)    All other components within normal limits  BRAIN NATRIURETIC PEPTIDE - Abnormal; Notable for the following components:   B Natriuretic Peptide 2,822.4 (*)    All other components within normal limits  URINALYSIS, COMPLETE (UACMP) WITH MICROSCOPIC - Abnormal; Notable for the following components:   Color, Urine YELLOW (*)    APPearance CLOUDY (*)    Protein, ur >=300 (*)    Leukocytes,Ua TRACE (*)    Bacteria, UA RARE (*)    All other components within normal limits  CBG MONITORING, ED  TROPONIN I (HIGH SENSITIVITY)  TROPONIN I (  HIGH SENSITIVITY)   ____________________________________________  EKG   EKG Interpretation  Date/Time:  Thursday October 05 2021 20:25:00 EDT Ventricular  Rate:  72 PR Interval:  230 QRS Duration: 92 QT Interval:  448 QTC Calculation: 490 R Axis:   70 Text Interpretation: Sinus rhythm with 1st degree A-V block Possible Left atrial enlargement Low voltage QRS T wave abnormality, consider anterior ischemia Abnormal ECG No significant change since last tracing Confirmed by Pryor Curia [phone]) on 10/06/2021 12:10:19 AM        ____________________________________________  RADIOLOGY Jessie Foot Kaity Pitstick, personally viewed and evaluated these images (plain radiographs) as part of my medical decision making, as well as reviewing the written report by the radiologist.  ED MD interpretation: CT of the chest shows no PE, edema, infiltrate.  CT of the abdomen pelvis shows a small amount of ascites.  Official radiology report(s): DG Chest 2 View  Result Date: 10/05/2021 CLINICAL DATA:  Chest pain, shortness of breath EXAM: CHEST - 2 VIEW COMPARISON:  Chest radiograph 11/02/2020 FINDINGS: The heart is markedly enlarged, unchanged. The mediastinal contours are within normal limits. There is vascular congestion with suspected mild pulmonary interstitial edema. There is no focal consolidation. There is no pleural effusion or pneumothorax. There is no acute osseous abnormality. IMPRESSION: Marked cardiomegaly with vascular congestion and suspected mild pulmonary interstitial edema. Electronically Signed   By: Valetta Mole M.D.   On: 10/05/2021 20:47   CT Angio Chest PE W and/or Wo Contrast  Result Date: 10/06/2021 CLINICAL DATA:  Weakness and chest pain, initial encounter EXAM: CT ANGIOGRAPHY CHEST WITH CONTRAST TECHNIQUE: Multidetector CT imaging of the chest was performed using the standard protocol during bolus administration of intravenous contrast. Multiplanar CT image reconstructions and MIPs were obtained to evaluate the vascular anatomy. CONTRAST:  60mL OMNIPAQUE IOHEXOL 350 MG/ML SOLN COMPARISON:  Chest x-ray from the previous day. FINDINGS:  Cardiovascular: Thoracic aorta shows atherosclerotic calcifications without aneurysmal dilatation or dissection. Cardiac enlargement is noted. Coronary calcifications are seen. Pulmonary artery is within normal limits. No focal filling defect to suggest pulmonary embolism is noted. Mediastinum/Nodes: Thoracic inlet is within normal limits. No sizable hilar or mediastinal adenopathy is noted. The esophagus as visualized is within normal limits. Lungs/Pleura: Lungs are well aerated bilaterally. Emphysematous changes are seen. No focal sizable nodule is noted. No focal infiltrate or sizable effusion is seen. Upper Abdomen: Visualized upper abdomen shows evidence of ascites. Musculoskeletal: No chest wall abnormality. No acute or significant osseous findings. Review of the MIP images confirms the above findings. IMPRESSION: No evidence of pulmonary emboli. No acute abnormality noted. Aortic Atherosclerosis (ICD10-I70.0) and Emphysema (ICD10-J43.9). Electronically Signed   By: Inez Catalina M.D.   On: 10/06/2021 02:31   CT ABDOMEN PELVIS W CONTRAST  Result Date: 10/06/2021 CLINICAL DATA:  Weakness, decreased appetite, chills, diaphoresis and bilateral rib pain. EXAM: CT ABDOMEN AND PELVIS WITH CONTRAST TECHNIQUE: Multidetector CT imaging of the abdomen and pelvis was performed using the standard protocol following bolus administration of intravenous contrast. CONTRAST:  43mL OMNIPAQUE IOHEXOL 350 MG/ML SOLN COMPARISON:  December 20, 2018 FINDINGS: Lower chest: There is moderate severity cardiomegaly. There is been interval resolution of the pericardial effusion seen on the prior study. Hepatobiliary: No focal liver abnormality is seen. Tiny gallstones are seen within the lumen of an otherwise normal-appearing gallbladder. Pancreas: Unremarkable. No pancreatic ductal dilatation or surrounding inflammatory changes. Spleen: An ill-defined 1.8 cm x 1.0 cm area of parenchymal low attenuation is seen within the posterior  aspect of  the spleen. Adrenals/Urinary Tract: Adrenal glands are unremarkable. The kidneys are small in size, without renal calculi, focal lesion, or hydronephrosis. Urinary bladder is partially contracted. Mild urinary bladder wall thickening is seen. Stomach/Bowel: Stomach is within normal limits. Appendix appears normal. A few mildly dilated small bowel loops are seen within the mid and upper left abdomen (maximum small bowel diameter of approximately 2.9 cm). A clear transition zone is not identified. Noninflamed diverticula are seen throughout the large bowel. Vascular/Lymphatic: Aortic atherosclerosis. No enlarged abdominal or pelvic lymph nodes. Reproductive: Uterus and bilateral adnexa are unremarkable. Other: No abdominal wall hernia or abnormality. There is a mild to moderate amount of abdominopelvic ascites. This is increased in size when compared to the prior study Musculoskeletal: No acute or significant osseous findings. IMPRESSION: 1. Mildly dilated small bowel loops within the mid and upper left abdomen which may represent a mild ileus. 2. Mild to moderate amount of abdominopelvic ascites, increased in size when compared to the prior study. 3. Cholelithiasis. 4. Colonic diverticulosis. 5. Moderate severity cardiomegaly. Aortic Atherosclerosis (ICD10-I70.0). Electronically Signed   By: Virgina Norfolk M.D.   On: 10/06/2021 02:38    ____________________________________________   PROCEDURES  Procedure(s) performed (including Critical Care):  Procedures   ____________________________________________   INITIAL IMPRESSION / ASSESSMENT AND PLAN / ED COURSE  As part of my medical decision making, I reviewed the following data within the [place] notes reviewed and incorporated, Labs reviewed , EKG interpreted , Old EKG reviewed, Old chart reviewed, Radiograph reviewed , CTs reviewed, Notes from prior ED visits, and [place] Controlled Substance Database          Patient here with complaints of left-sided chest pain, flank pain, abdominal pain.  EKG is nonischemic.  Differential includes ACS, PE, dissection, gastritis, pancreatitis, colitis, diverticulitis, UTI, kidney stone, pyelonephritis, musculoskeletal pain.  Doubt cauda equina, epidural abscess or hematoma, discitis or osteomyelitis.  Labs obtained in triage.  Troponin x2 negative.  Patient is anemic but this appears at her baseline.  She has chronic kidney disease and potassium slightly elevated at 5.7 without EKG changes.  Will give dose of Lokelma.  She is scheduled for dialysis later today.  LFTs normal but lipase minimally elevated.  Chest x-ray shows cardiomegaly with mild pulmonary social edema.  We will proceed with CT chest, abdomen pelvis for further evaluation.  Will obtain urine sample.  Will give IV pain and nausea medicine.  ED PROGRESS  Patient reports that her pain has improved.  She has been able to tolerate p.o. here.  CT of her chest shows no PE, pulmonary edema, infiltrate.  She does have cardiomegaly which is stable.  CT of her abdomen pelvis shows cholelithiasis without cholecystitis but she is not having any right-sided abdominal pain.  She does have mild amount of ascites as well but clinical exam is not suggestive of SBP.  She has not so distended that I feel she needs a therapeutic tap in the ED. urine shows no sign of infection today.  I suspect pain may be more musculoskeletal in nature.  Will discharge with prescription of pain medication and have her follow-up with her primary care doctor.  She verbalized understanding and is comfortable with this plan.   At this time, I do not feel there is any life-threatening condition present. I have reviewed, interpreted and discussed all results (EKG, imaging, lab, urine as appropriate) and exam findings with patient/family. I have reviewed nursing notes and appropriate previous records.  I  feel the patient is safe to be discharged home  without further emergent workup and can continue workup as an outpatient as needed. Discussed usual and customary return precautions. Patient/family verbalize understanding and are comfortable with this plan.  Outpatient follow-up has been provided as needed. All questions have been answered.  ____________________________________________   FINAL CLINICAL IMPRESSION(S) / ED DIAGNOSES  Final diagnoses:  Left flank pain  Hyperkalemia  Other ascites     ED Discharge Orders          Ordered    HYDROcodone-acetaminophen (NORCO/VICODIN) 5-325 MG tablet  Every 6 hours PRN        10/06/21 0340    ondansetron (ZOFRAN ODT) 4 MG disintegrating tablet  Every 6 hours PRN        10/06/21 0340            *Please note:  Janet Olson was evaluated in Emergency Department on 10/06/2021 for the symptoms described in the history of present illness. She was evaluated in the context of the global COVID-19 pandemic, which necessitated consideration that the patient might be at risk for infection with the SARS-CoV-2 virus that causes COVID-19. Institutional protocols and algorithms that pertain to the evaluation of patients at risk for COVID-19 are in a state of rapid change based on information released by regulatory bodies including the CDC and federal and state organizations. These policies and algorithms were followed during the patient's care in the ED.  Some ED evaluations and interventions may be delayed as a result of limited staffing during and the pandemic.*   Note:  This document was prepared using Dragon voice recognition software and may include unintentional dictation errors.    Kemoni Ortega, Delice Bison, DO 10/06/21 [phone]

## 2021-10-06 NOTE — Discharge Instructions (Addendum)
Please go to dialysis as scheduled today.  We have provided you with a prescription for hydrocodone to take as needed for pain.  Please do not take this with your tramadol.  If you continue to have pain in your left flank, recommend close follow-up with your primary care doctor.

## 2021-10-09 ENCOUNTER — Other Ambulatory Visit: Payer: Self-pay | Admitting: Nephrology

## 2021-10-09 DIAGNOSIS — R188 Other ascites: Secondary | ICD-10-CM

## 2021-10-10 ENCOUNTER — Other Ambulatory Visit: Payer: Self-pay

## 2021-10-10 ENCOUNTER — Other Ambulatory Visit: Payer: Self-pay | Admitting: Nephrology

## 2021-10-10 ENCOUNTER — Ambulatory Visit
Admission: RE | Admit: 2021-10-10 | Discharge: 2021-10-10 | Disposition: A | Discharge status: Home / Self Care | Payer: Medicare Other | Source: Ambulatory Visit | Attending: Nephrology | Admitting: Nephrology

## 2021-10-10 DIAGNOSIS — R188 Other ascites: Secondary | ICD-10-CM | POA: Insufficient documentation

## 2021-11-15 NOTE — Progress Notes (Deleted)
MRN : [id number]  Janet Olson is a 51 y.o. (March 07, 1970) female who presents with chief complaint of check access.  History of Present Illness:   The patient returns to the office for followup of their dialysis access. The function of the access has been stable. The patient denies increased bleeding time or increased recirculation. Patient denies difficulty with cannulation. The patient denies hand pain or other symptoms consistent with steal phenomena.  No significant arm swelling but she is having some right arm pain that begins in the shoulder and radiates down to the fingers.     The patient denies redness or swelling at the access site. The patient denies fever or chills at home or while on dialysis.   The patient denies amaurosis fugax or recent TIA symptoms. There are no recent neurological changes noted. The patient denies claudication symptoms or rest pain symptoms. The patient denies history of DVT, PE or superficial thrombophlebitis. The patient denies recent episodes of angina or shortness of breath.    Duplex ultrasound of the left brachial axillary AV graft demonstrates a flow volume of 1808 with velocities that are uniform through the graft.  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    Past Medical History:  Diagnosis Date   (HFpEF) heart failure with preserved ejection fraction ([place])    a. 2015 Echo: Nl EF. Mild to mod LVH. Mild PR/MR; b. 04/2017 Echo: EF 55-60%, no rwma, Gr1 DD. Mildly dil LA. Small effusion; c. 12/2018 Echo: EF 55-60%, mildly dil LA/PA. Mod dil RA. Mild to mod RV dil. Mildly reduced RV fxn. Mild TR. Mod elev PASP.   Abdominal ascites    Acute hypoxemic respiratory failure ([place]) 01/03/2019   Acute renal failure (ARF) ([place]) 05/17/2017   Acute respiratory failure ([place]) 09/25/2018   Anemia     Anxiety    Arthritis    Asthma    Cardiomegaly    COPD (chronic obstructive pulmonary disease) (HCC)    Depression    Dialysis patient ([place])    MON, WED , FRI   Dyspnea    ESRD (end stage renal disease) (HCC)    GERD (gastroesophageal reflux disease)    Headache    Migraines   Hypertension    Nonrheumatic tricuspid (valve) insufficiency    Respiratory failure with hypoxia ([place]) 04/21/2018   Ulcer    gastric   Vitamin D deficiency     Past Surgical History:  Procedure Laterality Date   A/V FISTULAGRAM Left 04/17/2018   Procedure: A/V FISTULAGRAM;  Surgeon: Algernon Huxley, MD;  Location: [facility];  Service: Cardiovascular;  Laterality: Left;   A/V FISTULAGRAM Left 05/27/2018   Procedure: A/V FISTULAGRAM;  Surgeon: Katha Cabal, MD;  Location: [facility];  Service: Cardiovascular;  Laterality: Left;   A/V FISTULAGRAM Left 07/05/2020   Procedure: A/V FISTULAGRAM;  Surgeon: Katha Cabal, MD;  Location: [facility];  Service: Cardiovascular;  Laterality: Left;   AV FISTULA PLACEMENT Left 08/08/2017   Procedure: ARTERIOVENOUS (AV) FISTULA CREATION ( BRACHIOCEPHALIC );  Surgeon: Algernon Huxley, MD;  Location: [facility];  Service: Vascular;  Laterality: Left;   AV FISTULA PLACEMENT Left 08/05/2020   Procedure: INSERTION OF ARTERIOVENOUS (AV) GRAFT ARM(BRACHIAL AXILLARY);  Surgeon: Katha Cabal, MD;  Location: [facility];  Service: Vascular;  Laterality: Left;   CESAREAN SECTION     x 4 (1988, 1991, 1997, 2000 )   COLONOSCOPY WITH PROPOFOL N/A 04/24/2018   Procedure: COLONOSCOPY WITH PROPOFOL;  Surgeon: Lin Landsman, MD;  Location: [facility];  Service: Gastroenterology;  Laterality: N/A;   DIALYSIS/PERMA CATHETER INSERTION N/A 07/16/2017   Procedure: Dialysis/Perma Catheter Insertion;  Surgeon: Katha Cabal, MD;  Location: [facility];  Service: Cardiovascular;  Laterality: N/A;   DIALYSIS/PERMA CATHETER REMOVAL N/A 10/29/2017    Procedure: DIALYSIS/PERMA CATHETER REMOVAL;  Surgeon: Katha Cabal, MD;  Location: [facility];  Service: Cardiovascular;  Laterality: N/A;   DIALYSIS/PERMA CATHETER REMOVAL N/A 11/03/2020   Procedure: DIALYSIS/PERMA CATHETER REMOVAL;  Surgeon: Algernon Huxley, MD;  Location: [facility];  Service: Cardiovascular;  Laterality: N/A;   excision of lymph nodes Bilateral 2014   bilateral under arms.   PARATHYROID IMPLANT REMOVAL     REMOVAL OF A DIALYSIS CATHETER Left 08/05/2020   Procedure: REMOVAL OF A DIALYSIS CATHETER ( FISTULA );  Surgeon: Katha Cabal, MD;  Location: [facility];  Service: Vascular;  Laterality: Left;    Social History Social History   Tobacco Use   Smoking status: Every Day    Packs/day: 0.50    Years: 30.00    Pack years: 15.00    Types: Cigarettes   Smokeless tobacco: Never  Vaping Use   Vaping Use: Never used  Substance Use Topics   Alcohol use: No   Drug use: No    Family History Family History  Problem Relation Age of Onset   Diabetes Mother     No Known Allergies   REVIEW OF SYSTEMS (Negative unless checked)  Constitutional: [] Weight loss  [] Fever  [] Chills Cardiac: [] Chest pain   [] Chest pressure   [] Palpitations   [] Shortness of breath when laying flat   [] Shortness of breath with exertion. Vascular:  [] Pain in legs with walking   [] Pain in legs at rest  [] History of DVT   [] Phlebitis   [] Swelling in legs   [] Varicose veins   [] Non-healing ulcers Pulmonary:   [] Uses home oxygen   [] Productive cough   [] Hemoptysis   [] Wheeze  [] COPD   [] Asthma Neurologic:  [] Dizziness   [] Seizures   [] History of stroke   [] History of TIA  [] Aphasia   [] Vissual changes   [] Weakness or numbness in arm   [] Weakness or numbness in leg Musculoskeletal:   [] Joint swelling   [] Joint pain   [] Low back pain Hematologic:  [] Easy bruising  [] Easy bleeding   [] Hypercoagulable state   [] Anemic Gastrointestinal:  [] Diarrhea   [] Vomiting   [x] Gastroesophageal reflux/heartburn   [] Difficulty swallowing. Genitourinary:  [x] Chronic kidney disease   [] Difficult urination  [] Frequent urination   [] Blood in urine Skin:  [] Rashes   [] Ulcers  Psychological:  [] History of anxiety   []  History of major depression.  Physical Examination  There were no vitals filed for this visit. There is no height or weight on file to calculate BMI. Gen: WD/WN, NAD Head: [place]/AT, No temporalis wasting.  Ear/Nose/Throat: Hearing grossly intact, nares w/o erythema or drainage Eyes: PER, EOMI, sclera nonicteric.  Neck: Supple, no gross masses or lesions.  No JVD.  Pulmonary:  Good air movement, no audible wheezing, no use of accessory muscles.  Cardiac: RRR, precordium non-hyperdynamic. Vascular:    Left arm AV graft good thrill good bruit moderate aneurysmal degeneration skin appears healthy Vessel Right Left  Radial Palpable Palpable  Brachial Palpable  Palpable  Gastrointestinal: soft, non-distended. No guarding/no peritoneal signs.  Musculoskeletal: M/S 5/5 throughout.  No deformity.  Neurologic: CN 2-12 intact. Pain and light touch intact in extremities.  Symmetrical.  Speech is fluent. Motor exam as listed above. Psychiatric: Judgment intact, Mood & affect appropriate for pt's clinical situation. Dermatologic: No rashes or ulcers noted.  No changes consistent with cellulitis.   CBC Lab Results  Component Value Date   WBC 6.6 10/05/2021   HGB 9.5 (L) 10/05/2021   HCT 29.7 (L) 10/05/2021   MCV 86.1 10/05/2021   PLT 258 10/05/2021    BMET    Component Value Date/Time   NA 133 (L) 10/05/2021 2024   NA 139 09/23/2014 1741   K 5.7 (H) 10/05/2021 2024   K 4.2 09/23/2014 1741   CL 96 (L) 10/05/2021 2024   CL 112 (H) 09/23/2014 1741   CO2 22 10/05/2021 2024   CO2 21 09/23/2014 1741   GLUCOSE 99 10/05/2021 2024   GLUCOSE 82 09/23/2014 1741   BUN 70 (H) 10/05/2021 2024   BUN 18 09/23/2014 1741   CREATININE 9.72 (H) 10/05/2021 2024    CREATININE 1.41 (H) 09/23/2014 1741   CALCIUM 8.5 (L) 10/05/2021 2024   CALCIUM 8.4 (L) 01/03/2019 0243   GFRNONAA 4 (L) 10/05/2021 2024   GFRNONAA 43 (L) 09/23/2014 1741   GFRNONAA 43 (L) 11/20/2012 1129   GFRAA 3 (L) 08/08/2020 1303   GFRAA 52 (L) 09/23/2014 1741   GFRAA 49 (L) 11/20/2012 1129   CrCl cannot be calculated (Patient's most recent lab result is older than the maximum 21 days allowed.).  COAG Lab Results  Component Value Date   INR 1.4 (H) 11/03/2020   INR 1.5 (H) 11/02/2020   INR 1.2 08/02/2020    Radiology No results found.   Assessment/Plan There are no diagnoses linked to this encounter.   Hortencia Pilar, MD  11/15/2021 8:30 AM

## 2021-11-16 ENCOUNTER — Encounter (INDEPENDENT_AMBULATORY_CARE_PROVIDER_SITE_OTHER): Payer: Medicare Other

## 2021-11-16 ENCOUNTER — Ambulatory Visit (INDEPENDENT_AMBULATORY_CARE_PROVIDER_SITE_OTHER): Payer: Medicare Other | Admitting: Vascular Surgery

## 2021-11-16 DIAGNOSIS — K219 Gastro-esophageal reflux disease without esophagitis: Secondary | ICD-10-CM

## 2021-11-16 DIAGNOSIS — J449 Chronic obstructive pulmonary disease, unspecified: Secondary | ICD-10-CM

## 2021-11-16 DIAGNOSIS — I10 Essential (primary) hypertension: Secondary | ICD-10-CM

## 2021-11-16 DIAGNOSIS — N186 End stage renal disease: Secondary | ICD-10-CM

## 2021-12-07 ENCOUNTER — Telehealth: Payer: Self-pay | Admitting: Cardiovascular Disease

## 2021-12-07 NOTE — Telephone Encounter (Signed)
   [facility] Pre-operative Risk Assessment    Patient Name: Janet Olson  DOB: [birth year] MRN: [id number]  HEARTCARE STAFF:  - IMPORTANT!!!!!! Under Visit Info/Reason for Call, type in Other and utilize the format Clearance MM/DD/YY or Clearance TBD. Do not use dashes or single digits. - Please review there is not already an duplicate clearance open for this procedure. - If request is for dental extraction, please clarify the # of teeth to be extracted. - If the patient is currently at the dentist's office, call Pre-Op Callback Staff (MA/nurse) to input urgent request.  - If the patient is not currently in the dentist office, please route to the Pre-Op pool.  Request for surgical clearance:  What type of surgery is being performed? R shld arthroscopic. Subscapularis repair, mini-open rotator cuff repair, subacromical decompression & biceps tenodesis  When is this surgery scheduled? 01/08/22  What type of clearance is required (medical clearance vs. Pharmacy clearance to hold med vs. Both)? both  Are there any medications that need to be held prior to surgery and how long? Not listed, please advise if needed   Practice name and name of physician performing surgery? [facility] - Janet Fabry, MD  What is the office phone number? [phone]   7.   What is the office fax number? [phone]  8.   Anesthesia type (None, local, MAC, general) ? Not listed    Janet Olson 12/07/2021, 3:35 PM  _________________________________________________________________   (provider comments below)

## 2021-12-08 NOTE — Telephone Encounter (Signed)
   Name: Janet Olson  DOB: 1970-06-21  MRN: [id number]   Primary Cardiologist: Ida Rogue, MD  Chart reviewed as part of pre-operative protocol coverage. Patient was contacted 12/08/2021 in reference to pre-operative risk assessment for pending surgery as outlined below.  Janet Olson was last seen on 07/2021 by Dr. Rockey Situ with history of HFpEF in the context of ESRD on HD, also multiple medical comorbidities as outlined. No known history of CAD. Previously cleared for unrelated procedure 07/2020 under general anesthesia and tolerated well. Phone number in her chart doesn't work, says number is in service. DPR on file for alternate contact [place]. [place] says patient is at dialysis right now and does not have an alternative # for the patient but she will be seeing her later today and will have her call us back when she can.  Charlie Pitter, PA-C 12/08/2021, 11:03 AM

## 2021-12-12 NOTE — Telephone Encounter (Signed)
Dr. Rockey Situ to review.  Patient is a 51 year old female with past medical history of asthma, COPD, ESRD on HD, anemia of chronic disease, chronic smoker, diabetes and hypertension.  Her blood pressure has been well controlled recently.  She did have a history of ascites during hospitalization in November 2021, she has not had any recurrent ascites recently.  She is unable to accomplish more than 4 METS of activity due to shortness of breath with activity.  However this is unchanged in the past year.  She denies any recent chest pain.  She previously underwent insertion of AV fistula in August 2021 under general anesthesia and tolerated the procedure.  Dr. Rockey Situ, would you recommend any additional evaluation for this patient prior to the surgery?  Please forward your response to [facility] PREOP

## 2021-12-20 ENCOUNTER — Other Ambulatory Visit: Payer: Self-pay | Admitting: Nephrology

## 2021-12-20 DIAGNOSIS — R188 Other ascites: Secondary | ICD-10-CM

## 2021-12-21 ENCOUNTER — Ambulatory Visit: Payer: Medicare Other | Attending: Nephrology

## 2021-12-28 ENCOUNTER — Ambulatory Visit
Admission: RE | Admit: 2021-12-28 | Discharge: 2021-12-28 | Disposition: A | Discharge status: Home / Self Care | Payer: Medicare Other | Source: Ambulatory Visit | Attending: Nephrology | Admitting: Nephrology

## 2021-12-28 ENCOUNTER — Other Ambulatory Visit: Payer: Self-pay | Admitting: Nephrology

## 2021-12-28 DIAGNOSIS — R188 Other ascites: Secondary | ICD-10-CM | POA: Diagnosis not present

## 2021-12-29 ENCOUNTER — Other Ambulatory Visit: Payer: Self-pay | Admitting: Orthopedic Surgery

## 2021-12-29 NOTE — Telephone Encounter (Signed)
I s/w Dr. Serita Grit office. I informed their office Dr. Rockey Situ is out of the office and will return next week. Per pre op provider we should have answer about clearance early next week. I will fax these notes as well to Dr. Posey Pronto to inform, Dr. Rockey Situ is out of the office until next week.

## 2021-12-29 NOTE — Telephone Encounter (Signed)
[facility] calling to check on status

## 2022-01-04 ENCOUNTER — Inpatient Hospital Stay
Admission: RE | Admit: 2022-01-04 | Discharge: 2022-01-04 | Disposition: A | Discharge status: Home / Self Care | Payer: Medicare Other | Source: Ambulatory Visit

## 2022-01-04 NOTE — Telephone Encounter (Signed)
° °  Patient Name: Janet Olson  DOB: 1970-12-25 MRN: [id number]  Primary Cardiologist: Ida Rogue, MD  Chart reviewed as part of pre-operative protocol coverage.   Per Dr. Rockey Situ, "Acceptable risk to proceed with procedure  No further testing"  Will route this bundled recommendation to requesting provider via Epic fax function. Please call with questions.  Charlie Pitter, PA-C 01/04/2022, 1:17 PM

## 2022-01-04 NOTE — Patient Instructions (Signed)
Your procedure is scheduled on: 01/08/22 - Monday Report to the Registration Desk on the 1st floor of the [facility]. To find out your arrival time, please call [phone] between 1PM - 3PM on: 01/07/22   REMEMBER: Instructions that are not followed completely may result in serious medical risk, up to and including death; or upon the discretion of your surgeon and anesthesiologist your surgery may need to be rescheduled.  Do not eat food after midnight the night before surgery.  No gum chewing, lozengers or hard candies.  You may however, drink CLEAR liquids up to 2 hours before you are scheduled to arrive for your surgery. Do not drink anything within 2 hours of your scheduled arrival time.  Clear liquids include: - water  - apple juice without pulp - gatorade (not RED, PURPLE, OR BLUE) - black coffee or tea (Do NOT add milk or creamers to the coffee or tea) Do NOT drink anything that is not on this list.  In addition, your doctor has ordered for you to drink the provided  Ensure Pre-Surgery Clear Carbohydrate Drink  Drinking this carbohydrate drink up to two hours before surgery helps to reduce insulin resistance and improve patient outcomes. Please complete drinking 2 hours prior to scheduled arrival time.  TAKE THESE MEDICATIONS THE MORNING OF SURGERY WITH A SIP OF WATER: - amLODipine (NORVASC) 10 MG tablet - DULERA 200-5 MCG/ACT AERO - DULoxetine (CYMBALTA) 60 MG capsule - metoprolol tartrate (LOPRESSOR) 50 MG tablet - pantoprazole (PROTONIX) 40 MG tablet, (take one the night before and one on the morning of surgery - helps to prevent nausea after surgery.)  Use albuterol (VENTOLIN HFA) 108 (90 Base) MCG/ACT inhaler on the day of surgery and bring to the hospital.  *One week prior to surgery: Stop Anti-inflammatories (NSAIDS) such as Advil, Aleve, Ibuprofen, Motrin, Naproxen, Naprosyn and Aspirin based products such as Excedrin, Goodys Powder, BC Powder.  Stop ANY OVER  THE COUNTER supplements until after surgery.  You may however, continue to take Tylenol if needed for pain up until the day of surgery.  No Alcohol for 24 hours before or after surgery.  No Smoking including e-cigarettes for 24 hours prior to surgery.  No chewable tobacco products for at least 6 hours prior to surgery.  No nicotine patches on the day of surgery.  Do not use any "recreational" drugs for at least a week prior to your surgery.  Please be advised that the combination of cocaine and anesthesia may have negative outcomes, up to and including death. If you test positive for cocaine, your surgery will be cancelled.  On the morning of surgery brush your teeth with toothpaste and water, you may rinse your mouth with mouthwash if you wish. Do not swallow any toothpaste or mouthwash.  Use CHG Soap or wipes as directed on instruction sheet.  Do not wear jewelry, make-up, hairpins, clips or nail polish.  Do not wear lotions, powders, or perfumes.   Do not shave body from the neck down 48 hours prior to surgery just in case you cut yourself which could leave a site for infection.  Also, freshly shaved skin may become irritated if using the CHG soap.  Contact lenses, hearing aids and dentures may not be worn into surgery.  Do not bring valuables to the hospital. [facility] is not responsible for any missing/lost belongings or valuables.   Notify your doctor if there is any change in your medical condition (cold, fever, infection).  Wear comfortable  clothing (specific to your surgery type) to the hospital.  After surgery, you can help prevent lung complications by doing breathing exercises.  Take deep breaths and cough every 1-2 hours. Your doctor may order a device called an Incentive Spirometer to help you take deep breaths. When coughing or sneezing, hold a pillow firmly against your incision with both hands. This is called splinting. Doing this helps protect your incision.  It also decreases belly discomfort.  If you are being admitted to the hospital overnight, leave your suitcase in the car. After surgery it may be brought to your room.  If you are being discharged the day of surgery, you will not be allowed to drive home. You will need a responsible adult (18 years or older) to drive you home and stay with you that night.   If you are taking public transportation, you will need to have a responsible adult (18 years or older) with you. Please confirm with your physician that it is acceptable to use public transportation.   Please call the [facility] at [phone] if you have any questions about these instructions.  Surgery Visitation Policy:  Patients undergoing a surgery or procedure may have one family member or support person with them as long as that person is not COVID-19 positive or experiencing its symptoms.  That person may remain in the waiting area during the procedure and may rotate out with other people.  Inpatient Visitation:    Visiting hours are 7 a.m. to 8 p.m. Up to two visitors ages 16+ are allowed at one time in a patient room. The visitors may rotate out with other people during the day. Visitors must check out when they leave, or other visitors will not be allowed. One designated support person may remain overnight. The visitor must pass COVID-19 screenings, use hand sanitizer when entering and exiting the patients room and wear a mask at all times, including in the patients room. Patients must also wear a mask when staff or their visitor are in the room. Masking is required regardless of vaccination status.

## 2022-01-04 NOTE — Telephone Encounter (Signed)
° °  Patient Name: Janet Olson  DOB: [birth year] MRN: [id number]  Primary Cardiologist: Ida Rogue, MD  Chart reviewed as part of pre-operative protocol coverage. Given time sensitive nature of surgery, will reach out to Dr. Rockey Situ via secure chat to review and relay reply per Hao's message from 12/12/21.  Charlie Pitter, PA-C 01/04/2022, 8:49 AM

## 2022-01-05 ENCOUNTER — Other Ambulatory Visit: Payer: Self-pay

## 2022-01-05 ENCOUNTER — Other Ambulatory Visit
Admission: RE | Admit: 2022-01-05 | Discharge: 2022-01-05 | Disposition: A | Discharge status: Home / Self Care | Payer: Medicare Other | Source: Ambulatory Visit | Attending: Orthopedic Surgery | Admitting: Orthopedic Surgery

## 2022-01-05 DIAGNOSIS — Z992 Dependence on renal dialysis: Secondary | ICD-10-CM

## 2022-01-05 DIAGNOSIS — N186 End stage renal disease: Secondary | ICD-10-CM

## 2022-01-05 DIAGNOSIS — I10 Essential (primary) hypertension: Secondary | ICD-10-CM

## 2022-01-05 HISTORY — DX: Heart failure, unspecified: I50.9

## 2022-01-05 NOTE — Pre-Procedure Instructions (Signed)
Cardiology and Nephrology clearance on chart

## 2022-01-08 ENCOUNTER — Encounter: Payer: Self-pay | Admitting: Orthopedic Surgery

## 2022-01-08 ENCOUNTER — Ambulatory Visit: Payer: Medicare Other | Admitting: Anesthesiology

## 2022-01-08 ENCOUNTER — Encounter
Admission: RE | Disposition: A | Discharge status: Home / Self Care | Payer: Self-pay | Source: Home / Self Care | Attending: Orthopedic Surgery

## 2022-01-08 ENCOUNTER — Other Ambulatory Visit: Payer: Self-pay

## 2022-01-08 ENCOUNTER — Ambulatory Visit
Admission: RE | Admit: 2022-01-08 | Discharge: 2022-01-08 | Disposition: A | Discharge status: Home / Self Care | Payer: Medicare Other | Attending: Orthopedic Surgery | Admitting: Orthopedic Surgery

## 2022-01-08 ENCOUNTER — Ambulatory Visit: Payer: Medicare Other

## 2022-01-08 DIAGNOSIS — M19011 Primary osteoarthritis, right shoulder: Secondary | ICD-10-CM | POA: Insufficient documentation

## 2022-01-08 DIAGNOSIS — M7541 Impingement syndrome of right shoulder: Secondary | ICD-10-CM | POA: Diagnosis not present

## 2022-01-08 DIAGNOSIS — Z6832 Body mass index (BMI) 32.0-32.9, adult: Secondary | ICD-10-CM | POA: Insufficient documentation

## 2022-01-08 DIAGNOSIS — I509 Heart failure, unspecified: Secondary | ICD-10-CM | POA: Diagnosis not present

## 2022-01-08 DIAGNOSIS — I071 Rheumatic tricuspid insufficiency: Secondary | ICD-10-CM | POA: Insufficient documentation

## 2022-01-08 DIAGNOSIS — Z992 Dependence on renal dialysis: Secondary | ICD-10-CM | POA: Insufficient documentation

## 2022-01-08 DIAGNOSIS — M75121 Complete rotator cuff tear or rupture of right shoulder, not specified as traumatic: Secondary | ICD-10-CM | POA: Diagnosis not present

## 2022-01-08 DIAGNOSIS — K219 Gastro-esophageal reflux disease without esophagitis: Secondary | ICD-10-CM | POA: Insufficient documentation

## 2022-01-08 DIAGNOSIS — S46211A Strain of muscle, fascia and tendon of other parts of biceps, right arm, initial encounter: Secondary | ICD-10-CM | POA: Diagnosis not present

## 2022-01-08 DIAGNOSIS — M75101 Unspecified rotator cuff tear or rupture of right shoulder, not specified as traumatic: Secondary | ICD-10-CM | POA: Diagnosis present

## 2022-01-08 DIAGNOSIS — I10 Essential (primary) hypertension: Secondary | ICD-10-CM

## 2022-01-08 DIAGNOSIS — F17200 Nicotine dependence, unspecified, uncomplicated: Secondary | ICD-10-CM | POA: Diagnosis not present

## 2022-01-08 DIAGNOSIS — D631 Anemia in chronic kidney disease: Secondary | ICD-10-CM | POA: Insufficient documentation

## 2022-01-08 DIAGNOSIS — N186 End stage renal disease: Secondary | ICD-10-CM | POA: Insufficient documentation

## 2022-01-08 DIAGNOSIS — Z419 Encounter for procedure for purposes other than remedying health state, unspecified: Secondary | ICD-10-CM

## 2022-01-08 DIAGNOSIS — D649 Anemia, unspecified: Secondary | ICD-10-CM | POA: Diagnosis not present

## 2022-01-08 DIAGNOSIS — I132 Hypertensive heart and chronic kidney disease with heart failure and with stage 5 chronic kidney disease, or end stage renal disease: Secondary | ICD-10-CM | POA: Insufficient documentation

## 2022-01-08 DIAGNOSIS — X58XXXA Exposure to other specified factors, initial encounter: Secondary | ICD-10-CM | POA: Insufficient documentation

## 2022-01-08 DIAGNOSIS — E669 Obesity, unspecified: Secondary | ICD-10-CM | POA: Insufficient documentation

## 2022-01-08 HISTORY — PX: SHOULDER ARTHROSCOPY WITH SUBACROMIAL DECOMPRESSION AND OPEN ROTATOR C: SHX5688

## 2022-01-08 LAB — BASIC METABOLIC PANEL
Anion gap: 14 (ref 5–15)
BUN: 60 mg/dL — ABNORMAL HIGH (ref 6–20)
CO2: 27 mmol/L (ref 22–32)
Calcium: 9.1 mg/dL (ref 8.9–10.3)
Chloride: 98 mmol/L (ref 98–111)
Creatinine, Ser: 8.29 mg/dL — ABNORMAL HIGH (ref 0.44–1.00)
GFR, Estimated: 5 mL/min — ABNORMAL LOW (ref >60)
Glucose, Bld: 94 mg/dL (ref 70–99)
Potassium: 4.4 mmol/L (ref 3.5–5.1)
Sodium: 139 mmol/L (ref 135–145)

## 2022-01-08 LAB — CBC
HCT: 34.7 % — ABNORMAL LOW (ref 36.0–46.0)
Hemoglobin: 10.8 g/dL — ABNORMAL LOW (ref 12.0–15.0)
MCH: 27.8 pg (ref 26.0–34.0)
MCHC: 31.1 g/dL (ref 30.0–36.0)
MCV: 89.2 fL (ref 80.0–100.0)
Platelets: 177 K/uL (ref 150–400)
RBC: 3.89 MIL/uL (ref 3.87–5.11)
RDW: 18.2 % — ABNORMAL HIGH (ref 11.5–15.5)
WBC: 5.3 K/uL (ref 4.0–10.5)
nRBC: 0 % (ref 0.0–0.2)

## 2022-01-08 LAB — POCT I-STAT, CHEM 8
BUN: 48 mg/dL — ABNORMAL HIGH (ref 6–20)
Calcium, Ion: 1.04 mmol/L — ABNORMAL LOW (ref 1.15–1.40)
Chloride: 103 mmol/L (ref 98–111)
Creatinine, Ser: 9 mg/dL — ABNORMAL HIGH (ref 0.44–1.00)
Glucose, Bld: 89 mg/dL (ref 70–99)
HCT: 37 % (ref 36.0–46.0)
Hemoglobin: 12.6 g/dL (ref 12.0–15.0)
Potassium: 4.5 mmol/L (ref 3.5–5.1)
Sodium: 139 mmol/L (ref 135–145)
TCO2: 26 mmol/L (ref 22–32)

## 2022-01-08 LAB — PROTIME-INR
INR: 1.2 (ref 0.8–1.2)
Prothrombin Time: 15.1 s (ref 11.4–15.2)

## 2022-01-08 LAB — APTT: aPTT: 31 s (ref 24–36)

## 2022-01-08 LAB — GLUCOSE, CAPILLARY: Glucose-Capillary: 115 mg/dL — ABNORMAL HIGH (ref 70–99)

## 2022-01-08 IMAGING — CT CT CERVICAL SPINE W/O CM
3 of 4 series · 14 of 33 positions shown, 17 images · non-contrast
Comparison: Head CT today reported separately.

CLINICAL DATA: 51-year-old female status post fall at dialysis
striking head. Pain.

EXAM:
CT CERVICAL SPINE WITHOUT CONTRAST
TECHNIQUE: Multidetector CT imaging of the cervical spine was performed without
intravenous contrast. Multiplanar CT image reconstructions were also
generated.

[Series 4: sagittal bone · sagittal · 0.32mm/px · 5 of 80 slices shown, 6 images]
[im 27/80  bone]
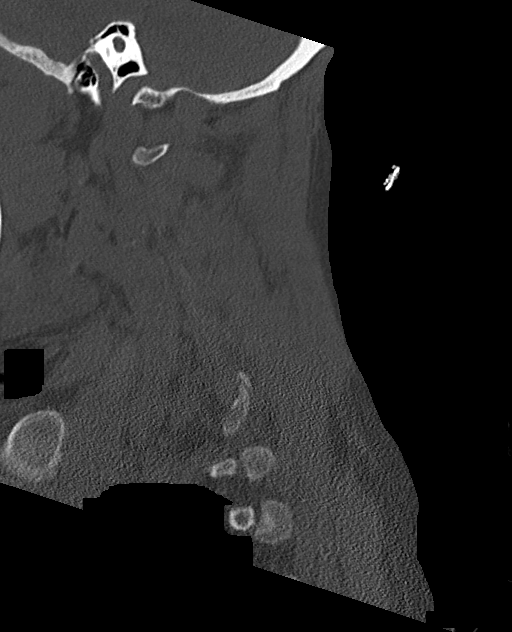
[im 33/80  bone]
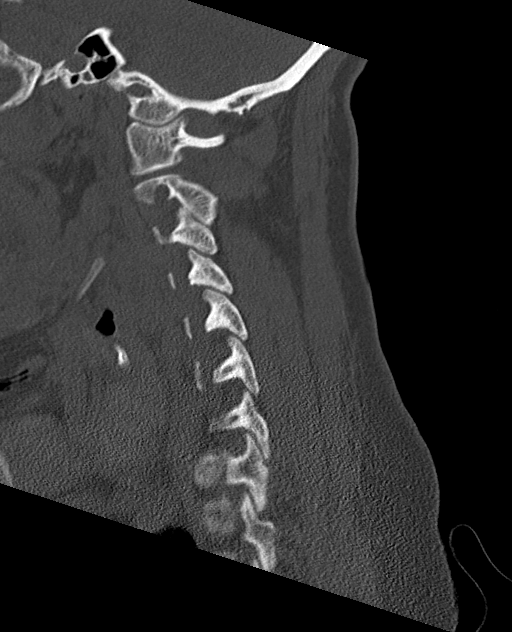
[im 40/80  soft-tissue]
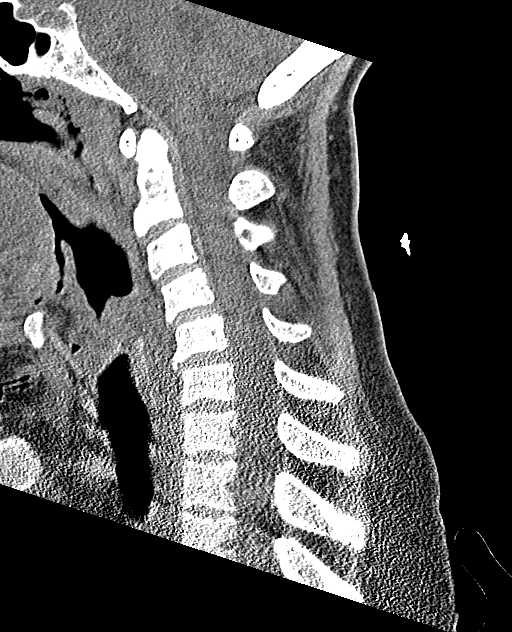
[im 40/80  bone]
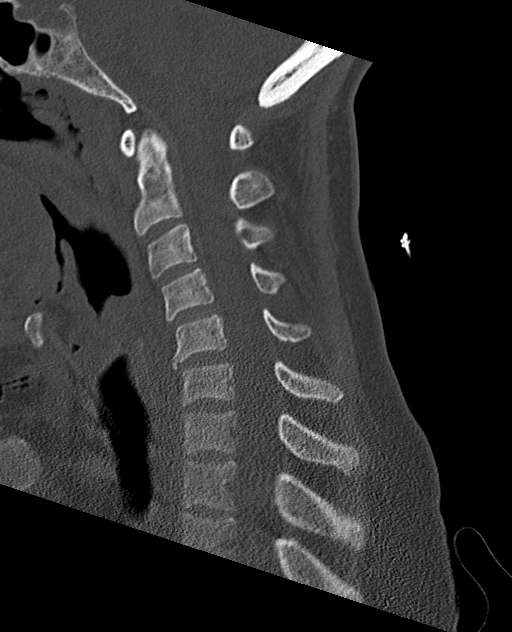
[im 47/80  bone]
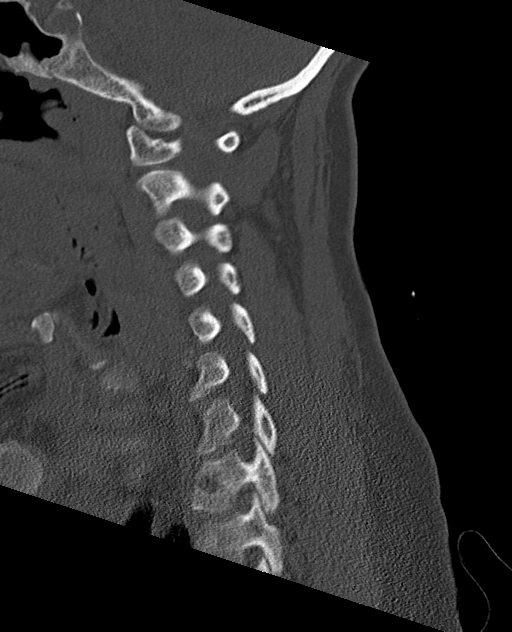
[im 53/80  bone]
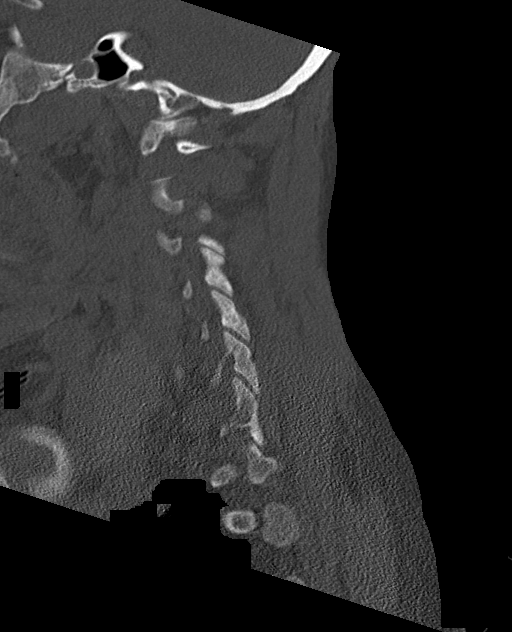

[Series 5: coronal bone · coronal · 0.29mm/px · 3 of 63 slices shown]
[im 13/63  bone]
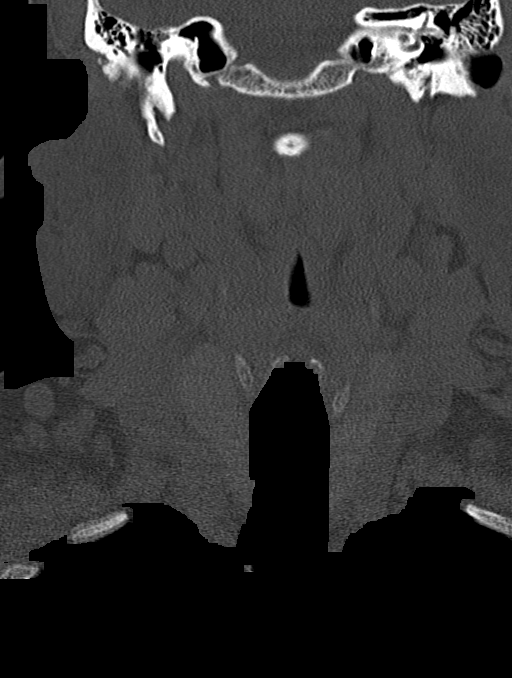
[im 25/63  bone]
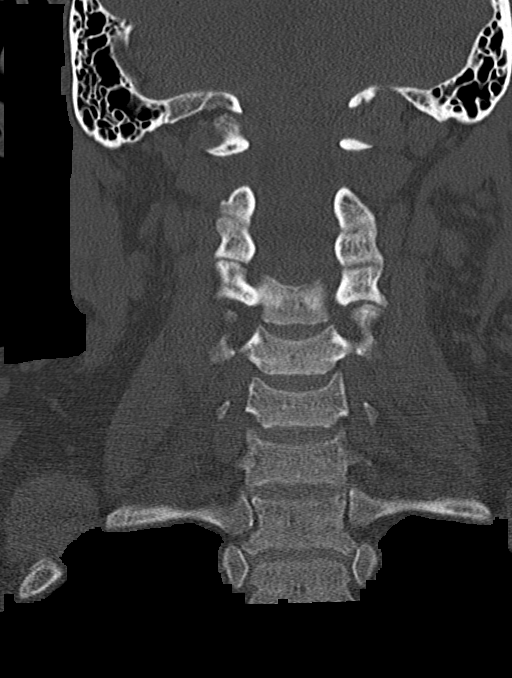
[im 38/63  bone]
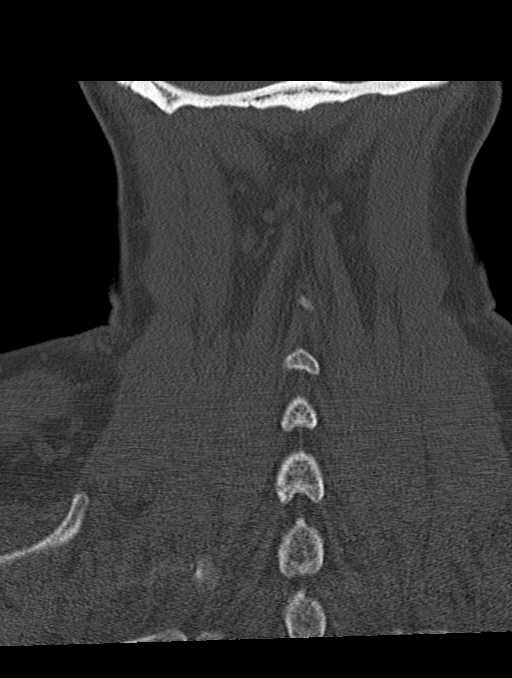

[Series 6: orthogonal bone · axial · 0.30mm/px · z∈[-307,-180]mm · 6 of 98 slices shown, 8 images]
[im 14/98  soft-tissue]
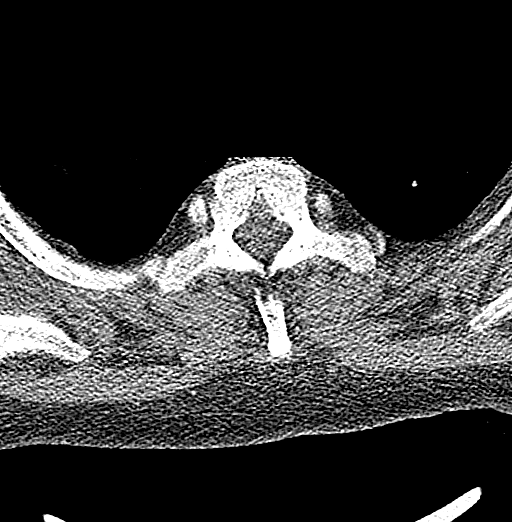
[im 14/98  bone]
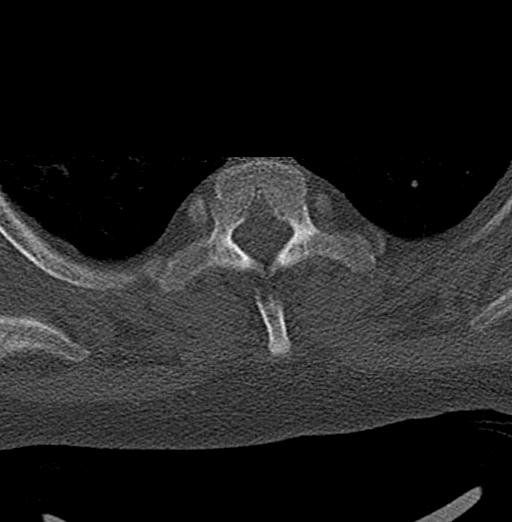
[im 28/98  bone]
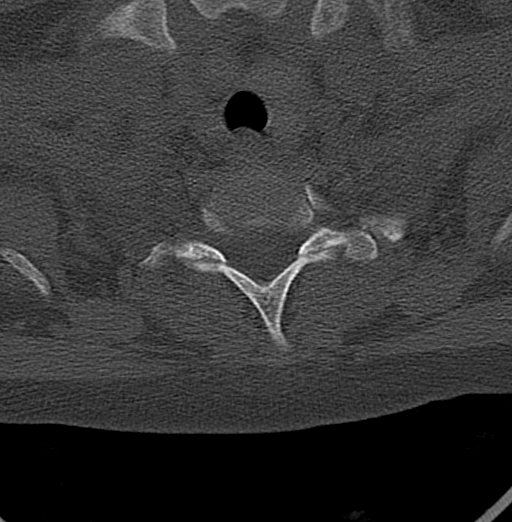
[im 42/98  bone]
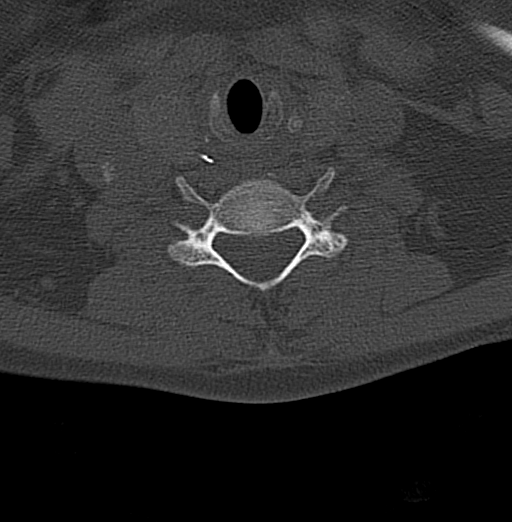
[im 56/98  bone]
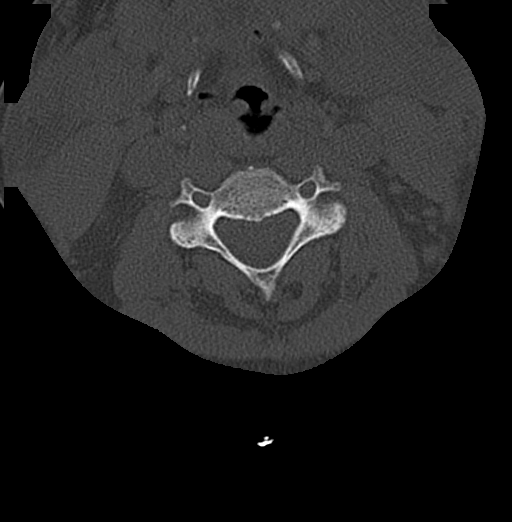
[im 70/98  soft-tissue]
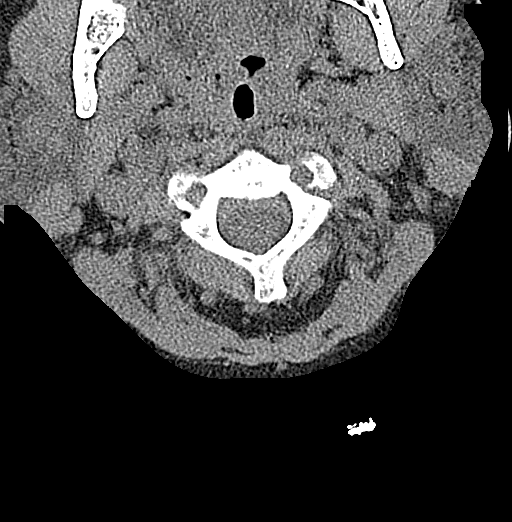
[im 70/98  bone]
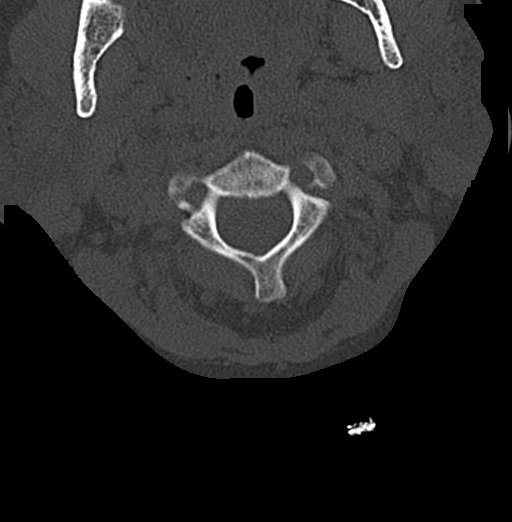
[im 84/98  bone]
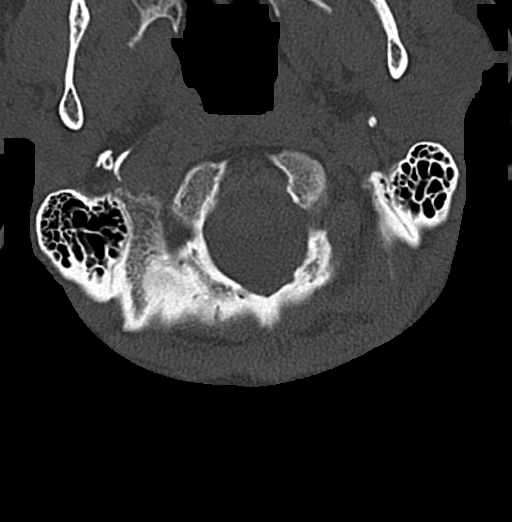

[14 of 33 positions shown; findings below may reference images not displayed]

FINDINGS: Alignment: Reversal of cervical lordosis. Cervicothoracic junction
alignment is within normal limits. Bilateral posterior element
alignment is within normal limits.

Skull base and vertebrae: Visualized skull base is intact. No
atlanto-occipital dissociation. C1 and C2 appear intact and aligned.
No acute osseous abnormality identified.

Soft tissues and spinal canal: No prevertebral fluid or swelling. No
visible canal hematoma. There is curvilinear dystrophic
calcification in the lumen of the right internal jugular vein
(series 3, image 61). No right carotid space inflammation. Otherwise
negative visible noncontrast neck soft tissues.

Disc levels:  No age advanced degeneration.

Upper chest: Visible upper thoracic levels appear intact.
Centrilobular emphysema in the lung apices. Negative noncontrast
thoracic inlet.
IMPRESSION: 1. No acute traumatic injury identified in the cervical spine.
2. Evidence of calcified chronic thrombus in the right internal
jugular vein.
3. Emphysema ([id number]-[id number]).

## 2022-01-08 SURGERY — SHOULDER ARTHROSCOPY WITH SUBACROMIAL DECOMPRESSION AND OPEN ROTATOR CUFF REPAIR, OPEN BICEPS TENDON REPAIR
Anesthesia: General | Laterality: Right

## 2022-01-08 MED ORDER — LACTATED RINGERS IR SOLN
Status: DC | PRN
  Administered 2022-01-08 (×10): 3000 mL

## 2022-01-08 MED ORDER — CEFAZOLIN SODIUM-DEXTROSE 2-4 GM/100ML-% IV SOLN
INTRAVENOUS | Status: AC
  Filled 2022-01-08: qty 100

## 2022-01-08 MED ORDER — BUPIVACAINE HCL (PF) 0.5 % IJ SOLN
INTRAMUSCULAR | Status: AC
  Filled 2022-01-08: qty 10

## 2022-01-08 MED ORDER — ROCURONIUM BROMIDE 10 MG/ML (PF) SYRINGE
PREFILLED_SYRINGE | INTRAVENOUS | Status: AC
  Filled 2022-01-08: qty 10

## 2022-01-08 MED ORDER — EPINEPHRINE PF 1 MG/ML IJ SOLN
INTRAMUSCULAR | Status: AC
  Filled 2022-01-08: qty 4

## 2022-01-08 MED ORDER — LIDOCAINE HCL (PF) 2 % IJ SOLN
INTRAMUSCULAR | Status: AC
  Filled 2022-01-08: qty 5

## 2022-01-08 MED ORDER — ONDANSETRON 4 MG PO TBDP: 4.0000 mg | ORAL_TABLET | Freq: Three times a day (TID) | ORAL | 0 refills | Status: DC | PRN

## 2022-01-08 MED ORDER — PROPOFOL 10 MG/ML IV BOLUS
INTRAVENOUS | Status: DC | PRN
  Administered 2022-01-08: 200 mg via INTRAVENOUS

## 2022-01-08 MED ORDER — ONDANSETRON HCL 4 MG/2ML IJ SOLN
INTRAMUSCULAR | Status: AC
  Filled 2022-01-08: qty 2

## 2022-01-08 MED ORDER — CEFAZOLIN SODIUM-DEXTROSE 2-4 GM/100ML-% IV SOLN
2.0000 g | INTRAVENOUS | Status: AC
  Administered 2022-01-08: 2 g via INTRAVENOUS

## 2022-01-08 MED ORDER — ROCURONIUM BROMIDE 100 MG/10ML IV SOLN
INTRAVENOUS | Status: DC | PRN
  Administered 2022-01-08: 20 mg via INTRAVENOUS
  Administered 2022-01-08: 50 mg via INTRAVENOUS

## 2022-01-08 MED ORDER — EPINEPHRINE PF 1 MG/ML IJ SOLN
INTRAMUSCULAR | Status: AC
  Filled 2022-01-08: qty 2

## 2022-01-08 MED ORDER — OXYCODONE HCL 5 MG/5ML PO SOLN: 5.0000 mg | Freq: Once | ORAL | Status: AC | PRN

## 2022-01-08 MED ORDER — LIDOCAINE HCL (PF) 1 % IJ SOLN
INTRAMUSCULAR | Status: AC
  Filled 2022-01-08: qty 2

## 2022-01-08 MED ORDER — FENTANYL CITRATE (PF) 100 MCG/2ML IJ SOLN
INTRAMUSCULAR | Status: DC | PRN
  Administered 2022-01-08 (×2): 25 ug via INTRAVENOUS
  Administered 2022-01-08 (×2): 50 ug via INTRAVENOUS

## 2022-01-08 MED ORDER — ACETAMINOPHEN 10 MG/ML IV SOLN: 1000.0000 mg | Freq: Once | INTRAVENOUS | Status: DC | PRN

## 2022-01-08 MED ORDER — ORAL CARE MOUTH RINSE: 15.0000 mL | Freq: Once | OROMUCOSAL | Status: AC

## 2022-01-08 MED ORDER — ASPIRIN EC 325 MG PO TBEC: 325.0000 mg | DELAYED_RELEASE_TABLET | Freq: Every day | ORAL | 0 refills | Status: AC

## 2022-01-08 MED ORDER — CHLORHEXIDINE GLUCONATE 0.12 % MT SOLN
OROMUCOSAL | Status: AC
  Administered 2022-01-08: 15 mL via OROMUCOSAL
  Filled 2022-01-08: qty 15

## 2022-01-08 MED ORDER — FENTANYL CITRATE (PF) 100 MCG/2ML IJ SOLN: 25.0000 ug | INTRAMUSCULAR | Status: DC | PRN

## 2022-01-08 MED ORDER — BUPIVACAINE LIPOSOME 1.3 % IJ SUSP
INTRAMUSCULAR | Status: DC | PRN
  Administered 2022-01-08: 10 mL

## 2022-01-08 MED ORDER — MIDAZOLAM HCL 2 MG/2ML IJ SOLN
1.0000 mg | INTRAMUSCULAR | Status: AC | PRN
  Administered 2022-01-08: 1 mg via INTRAVENOUS

## 2022-01-08 MED ORDER — DEXAMETHASONE SODIUM PHOSPHATE 10 MG/ML IJ SOLN
INTRAMUSCULAR | Status: DC | PRN
  Administered 2022-01-08: 10 mg via INTRAVENOUS

## 2022-01-08 MED ORDER — CHLORHEXIDINE GLUCONATE 0.12 % MT SOLN: 15.0000 mL | Freq: Once | OROMUCOSAL | Status: AC

## 2022-01-08 MED ORDER — LACTATED RINGERS IV SOLN
INTRAVENOUS | Status: DC | PRN
  Administered 2022-01-08 (×4): 3001 mL

## 2022-01-08 MED ORDER — OXYCODONE HCL 5 MG PO TABS
5.0000 mg | ORAL_TABLET | Freq: Once | ORAL | Status: AC | PRN
  Administered 2022-01-08: 5 mg via ORAL

## 2022-01-08 MED ORDER — OXYCODONE HCL 5 MG PO TABS: 5.0000 mg | ORAL_TABLET | ORAL | 0 refills | Status: DC | PRN

## 2022-01-08 MED ORDER — MIDAZOLAM HCL 2 MG/2ML IJ SOLN
INTRAMUSCULAR | Status: AC
  Administered 2022-01-08: 1 mg via INTRAVENOUS
  Filled 2022-01-08: qty 2

## 2022-01-08 MED ORDER — ONDANSETRON HCL 4 MG/2ML IJ SOLN
INTRAMUSCULAR | Status: DC | PRN
  Administered 2022-01-08: 4 mg via INTRAVENOUS

## 2022-01-08 MED ORDER — LIDOCAINE HCL (CARDIAC) PF 100 MG/5ML IV SOSY
PREFILLED_SYRINGE | INTRAVENOUS | Status: DC | PRN
Start: 2022-01-08 — End: 2022-01-08
  Administered 2022-01-08: 100 mg via INTRAVENOUS

## 2022-01-08 MED ORDER — PHENYLEPHRINE HCL-NACL 20-0.9 MG/250ML-% IV SOLN
INTRAVENOUS | Status: DC | PRN
  Administered 2022-01-08: 35 ug/min via INTRAVENOUS

## 2022-01-08 MED ORDER — BUPIVACAINE LIPOSOME 1.3 % IJ SUSP
INTRAMUSCULAR | Status: AC
  Filled 2022-01-08: qty 10

## 2022-01-08 MED ORDER — FENTANYL CITRATE (PF) 100 MCG/2ML IJ SOLN
INTRAMUSCULAR | Status: AC
  Filled 2022-01-08: qty 2

## 2022-01-08 MED ORDER — OXYCODONE HCL 5 MG PO TABS
ORAL_TABLET | ORAL | Status: AC
  Filled 2022-01-08: qty 1

## 2022-01-08 MED ORDER — PROPOFOL 10 MG/ML IV BOLUS
INTRAVENOUS | Status: AC
  Filled 2022-01-08: qty 20

## 2022-01-08 MED ORDER — PROMETHAZINE HCL 25 MG/ML IJ SOLN: 6.2500 mg | INTRAMUSCULAR | Status: DC | PRN

## 2022-01-08 MED ORDER — SODIUM CHLORIDE 0.9 % IV SOLN: INTRAVENOUS | Status: DC

## 2022-01-08 MED ORDER — LIDOCAINE HCL (PF) 1 % IJ SOLN
INTRAMUSCULAR | Status: DC | PRN
  Administered 2022-01-08: 10 mL via SUBCUTANEOUS

## 2022-01-08 MED ORDER — BUPIVACAINE HCL (PF) 0.5 % IJ SOLN
INTRAMUSCULAR | Status: DC | PRN
  Administered 2022-01-08: 10 mL via PERINEURAL

## 2022-01-08 MED ORDER — ACETAMINOPHEN 500 MG PO TABS: 1000.0000 mg | ORAL_TABLET | Freq: Three times a day (TID) | ORAL | 2 refills | Status: AC

## 2022-01-08 MED ORDER — BUPIVACAINE HCL (PF) 0.5 % IJ SOLN
INTRAMUSCULAR | Status: AC
  Filled 2022-01-08: qty 30

## 2022-01-08 MED ORDER — ACETAMINOPHEN 10 MG/ML IV SOLN
INTRAVENOUS | Status: AC
  Filled 2022-01-08: qty 100

## 2022-01-08 MED ORDER — DEXAMETHASONE SODIUM PHOSPHATE 10 MG/ML IJ SOLN
INTRAMUSCULAR | Status: AC
  Filled 2022-01-08: qty 1

## 2022-01-08 MED ORDER — PENTAFLUOROPROP-TETRAFLUOROETH EX AERO
INHALATION_SPRAY | CUTANEOUS | Status: AC
  Filled 2022-01-08: qty 30

## 2022-01-08 MED ORDER — ACETAMINOPHEN 10 MG/ML IV SOLN
INTRAVENOUS | Status: DC | PRN
  Administered 2022-01-08: 1000 mg via INTRAVENOUS

## 2022-01-08 SURGICAL SUPPLY — 90 items
ADAPTER IRRIG TUBE 2 SPIKE SOL (ADAPTER) ×4 IMPLANT
ADH SKN CLS APL DERMABOND .7 (GAUZE/BANDAGES/DRESSINGS)
ADPR TBG 2 SPK PMP STRL ASCP (ADAPTER) ×2
ANCH SUT 2 SWLK 19.1 CLS EYLT (Anchor) ×2 IMPLANT
ANCH SUT 2X2.3 TAPE (Anchor) ×1 IMPLANT
ANCH SUT SWLK 19.1X4.75 (Anchor) ×1 IMPLANT
ANCHOR 2.3 SP SGL 1.2 XBRAID (Anchor) ×1 IMPLANT
ANCHOR SUT BIO SW 4.75X19.1 (Anchor) ×1 IMPLANT
ANCHOR SWIVELOCK BIO 4.75X19.1 (Anchor) ×3 IMPLANT
APL PRP STRL LF DISP 70% ISPRP (MISCELLANEOUS) ×1
BLADE SHAVER 4.5X7 STR FR (MISCELLANEOUS) ×2 IMPLANT
BNDG ADH 2 X3.75 FABRIC TAN LF (GAUZE/BANDAGES/DRESSINGS) ×2 IMPLANT
BNDG ADH XL 3.75X2 STRCH LF (GAUZE/BANDAGES/DRESSINGS) ×1
BUR BR 5.5 WIDE MOUTH (BURR) ×1 IMPLANT
CANNULA PART THRD DISP 5.75X7 (CANNULA) IMPLANT
CANNULA PARTIAL THREAD 2X7 (CANNULA) ×1 IMPLANT
CANNULA TWIST IN 8.25X7CM (CANNULA) ×1 IMPLANT
CANNULA TWIST IN 8.25X9CM (CANNULA) IMPLANT
CHLORAPREP W/TINT 26 (MISCELLANEOUS) ×2 IMPLANT
COOLER POLAR GLACIER W/PUMP (MISCELLANEOUS) ×2 IMPLANT
DERMABOND ADVANCED (GAUZE/BANDAGES/DRESSINGS)
DERMABOND ADVANCED .7 DNX12 (GAUZE/BANDAGES/DRESSINGS) IMPLANT
DRAPE 3/4 80X56 (DRAPES) ×2 IMPLANT
DRAPE IMP U-DRAPE 54X76 (DRAPES) ×4 IMPLANT
DRAPE INCISE IOBAN 66X45 STRL (DRAPES) ×2 IMPLANT
DRAPE U-SHAPE 47X51 STRL (DRAPES) ×4 IMPLANT
DRSG TEGADERM 4X4.75 (GAUZE/BANDAGES/DRESSINGS) ×6 IMPLANT
ELECT REM PT RETURN 9FT ADLT (ELECTROSURGICAL) ×2
ELECTRODE REM PT RTRN 9FT ADLT (ELECTROSURGICAL) ×1 IMPLANT
GAUZE SPONGE 4X4 12PLY STRL (GAUZE/BANDAGES/DRESSINGS) ×2 IMPLANT
GAUZE XEROFORM 1X8 LF (GAUZE/BANDAGES/DRESSINGS) ×2 IMPLANT
GLOVE SRG 8 PF TXTR STRL LF DI (GLOVE) ×2 IMPLANT
GLOVE SURG ENC MOIS LTX SZ7.5 (GLOVE) ×2 IMPLANT
GLOVE SURG ORTHO LTX SZ8 (GLOVE) ×2 IMPLANT
GLOVE SURG SYN 8.0 (GLOVE) ×2 IMPLANT
GLOVE SURG SYN 8.0 PF PI (GLOVE) ×1 IMPLANT
GLOVE SURG UNDER POLY LF SZ8 (GLOVE) ×4
GOWN STRL REUS W/ TWL LRG LVL3 (GOWN DISPOSABLE) ×2 IMPLANT
GOWN STRL REUS W/TWL LRG LVL3 (GOWN DISPOSABLE) ×4
GOWN STRL REUS W/TWL XL LVL4 (GOWN DISPOSABLE) ×2 IMPLANT
IV LACTATED RINGER IRRG 3000ML (IV SOLUTION) ×8
IV LR IRRIG 3000ML ARTHROMATIC (IV SOLUTION) ×4 IMPLANT
KIT CORKSCREW KNTLS 3.9 S/T/P (INSTRUMENTS) IMPLANT
KIT STABILIZATION SHOULDER (MISCELLANEOUS) ×2 IMPLANT
KIT SUTURETAK 3.0 INSERT PERC (KITS) IMPLANT
KIT TURNOVER KIT A (KITS) ×2 IMPLANT
MANIFOLD NEPTUNE II (INSTRUMENTS) ×4 IMPLANT
MASK FACE SPIDER DISP (MASK) ×2 IMPLANT
MAT ABSORB  FLUID 56X50 GRAY (MISCELLANEOUS) ×2
MAT ABSORB FLUID 56X50 GRAY (MISCELLANEOUS) ×2 IMPLANT
NDL MAYO CATGUT SZ5 (NEEDLE)
NDL SAFETY ECLIPSE 18X1.5 (NEEDLE) ×1 IMPLANT
NDL SCORPION MULTI FIRE (NEEDLE) IMPLANT
NDL SUT 5 .5 CRC TPR PNT MAYO (NEEDLE) IMPLANT
NEEDLE HYPO 18GX1.5 SHARP (NEEDLE) ×2
NEEDLE SCORPION MULTI FIRE (NEEDLE) IMPLANT
PACK ARTHROSCOPY SHOULDER (MISCELLANEOUS) ×2 IMPLANT
PAD ARMBOARD 7.5X6 YLW CONV (MISCELLANEOUS) ×2 IMPLANT
PAD WRAPON POLAR SHDR XLG (MISCELLANEOUS) ×1 IMPLANT
PASSER SUT FIRSTPASS SELF (INSTRUMENTS) ×2 IMPLANT
PASSER SUT SWIFTSTITCH HIP CRT (INSTRUMENTS) ×2 IMPLANT
PENCIL SMOKE EVACUATOR (MISCELLANEOUS) ×1 IMPLANT
SHAVER BLADE BONE CUTTER  5.5 (BLADE) ×1
SHAVER BLADE BONE CUTTER 5.5 (BLADE) ×1 IMPLANT
SLING ULTRA II M (MISCELLANEOUS) ×1 IMPLANT
SPONGE T-LAP 18X18 ~~LOC~~+RFID (SPONGE) ×2 IMPLANT
STAPLER SKIN PROX 35W (STAPLE) IMPLANT
STRAP SAFETY 5IN WIDE (MISCELLANEOUS) ×2 IMPLANT
SUT ETHILON 3-0 (SUTURE) ×2 IMPLANT
SUT FIBERWIRE #2 38 T-5 BLUE (SUTURE) ×2
SUT LASSO 90 DEG CVD (SUTURE) IMPLANT
SUT LASSO 90 DEG SD STR (SUTURE) IMPLANT
SUT MNCRL 4-0 (SUTURE)
SUT MNCRL 4-0 27XMFL (SUTURE)
SUT PROLENE 0 CT 2 (SUTURE) IMPLANT
SUT VIC AB 0 CT1 36 (SUTURE) IMPLANT
SUT VIC AB 2-0 CT2 27 (SUTURE) IMPLANT
SUT XBRAID 1.4 WHT/BLK 40 (SUTURE) ×1 IMPLANT
SUTURE FIBERWR #2 38 T-5 BLUE (SUTURE) IMPLANT
SUTURE MNCRL 4-0 27XMF (SUTURE) IMPLANT
SUTURE TAPE 1.3 40 TPR END (SUTURE) IMPLANT
SUTURETAPE 1.3 40 TPR END (SUTURE) ×4
TAPE CLOTH 3X10 WHT NS LF (GAUZE/BANDAGES/DRESSINGS) ×2 IMPLANT
TAPE MICROFOAM 4IN (TAPE) ×2 IMPLANT
TUBING CONNECTING 10 (TUBING) IMPLANT
TUBING INFLOW SET DBFLO PUMP (TUBING) ×2 IMPLANT
TUBING OUTFLOW SET DBLFO PUMP (TUBING) ×2 IMPLANT
WAND WEREWOLF FLOW 90D (MISCELLANEOUS) ×2 IMPLANT
WATER STERILE IRR 500ML POUR (IV SOLUTION) ×2 IMPLANT
WRAPON POLAR PAD SHDR XLG (MISCELLANEOUS) ×2

## 2022-01-08 NOTE — OR Nursing (Signed)
Called waiting room to update family.

## 2022-01-08 NOTE — Discharge Instructions (Addendum)
Post-Op Instructions - Rotator Cuff Repair  1. Bracing: You will wear a shoulder immobilizer or sling for 6 weeks.   2. Driving: No driving for at least 3 weeks post-op. When driving, do not wear the immobilizer. Ideally, we recommend no driving for 6 weeks while sling is in place as one arm will be immobilized.   3. Activity: No active lifting for 2 months. Wrist, hand, and elbow motion only. Avoid lifting the upper arm away from the body except for hygiene. You are permitted to bend and straighten the elbow passively only (no active elbow motion). You may use your hand and wrist for typing, writing, and managing utensils (cutting food). Do not lift more than a coffee cup for 8 weeks.  When sleeping or resting, inclined positions (recliner chair or wedge pillow) and a pillow under the forearm for support may provide better comfort for up to 4 weeks.  Avoid long distance travel for 4 weeks.  Return to normal activities after rotator cuff repair repair normally takes 6 months on average. If rehab goes very well, may be able to do most activities at 4 months, except overhead or contact sports.  4. Physical Therapy: Begins 3-4 days after surgery, and proceed 1 time per week for the first 6 weeks, then 1-2 times per week from weeks 6-20 post-op.  5. Medications:  - You will be provided a prescription for narcotic pain medicine. After surgery, take 1-2 narcotic tablets every 4 hours if needed for severe pain.  - A prescription for anti-nausea medication will be provided in case the narcotic medicine causes nausea - take 1 tablet every 6 hours only if nauseated.   - Take tylenol 1000 mg (2 Extra Strength tablets or 3 regular strength) every 8 hours for pain.  May decrease or stop tylenol 5 days after surgery if you are having minimal pain. - Take ASA 325mg /day x 2 weeks to help prevent DVTs/PEs (blood clots).  - DO NOT take ANY nonsteroidal anti-inflammatory pain medications (Advil, Motrin, Ibuprofen,  Aleve, Naproxen, or Naprosyn). These medicines can inhibit healing of your shoulder repair.    If you are taking prescription medication for anxiety, depression, insomnia, muscle spasm, chronic pain, or for attention deficit disorder, you are advised that you are at a higher risk of adverse effects with use of narcotics post-op, including narcotic addiction/dependence, depressed breathing, death. If you use non-prescribed substances: alcohol, marijuana, cocaine, heroin, methamphetamines, etc., you are at a higher risk of adverse effects with use of narcotics post-op, including narcotic addiction/dependence, depressed breathing, death. You are advised that taking > 50 morphine milligram equivalents (MME) of narcotic pain medication per day results in twice the risk of overdose or death. For your prescription provided: oxycodone 5 mg - taking more than 6 tablets per day would result in > 50 morphine milligram equivalents (MME) of narcotic pain medication. Be advised that we will prescribe narcotics short-term, for acute post-operative pain only - 3 weeks for major operations such as shoulder repair/reconstruction surgeries.     6. Post-Op Appointment:  Your first post-op appointment will be 10-14 days post-op.  7. Work or School: For most, but not all procedures, we advise staying out of work or school for at least 1 to 2 weeks in order to recover from the stress of surgery and to allow time for healing.   If you need a work or school note this can be provided.   8. Smoking: If you are a smoker, you need to  refrain from smoking in the postoperative period. The nicotine in cigarettes will inhibit healing of your shoulder repair and decrease the chance of successful repair. Similarly, nicotine containing products (gum, patches) should be avoided.   Post-operative Brace: Apply and remove the brace you received as you were instructed to at the time of fitting and as described in detail as the braces  instructions for use indicate.  Wear the brace for the period of time prescribed by your physician.  The brace can be cleaned with soap and water and allowed to air dry only.  Should the brace result in increased pain, decreased feeling (numbness/tingling), increased swelling or an overall worsening of your medical condition, please contact your doctor immediately.  If an emergency situation occurs as a result of wearing the brace after normal business hours, please dial 911 and seek immediate medical attention.  Let your doctor know if you have any further questions about the brace issued to you. Refer to the shoulder sling instructions for use if you have any questions regarding the correct fit of your shoulder sling.  [facility]: [phone]  Video that illustrates how to properly use a shoulder sling: "Instructions for Proper Use of an Orthopaedic Sling" [email or web address]   AMBULATORY SURGERY  DISCHARGE INSTRUCTIONS   The drugs that you were given will stay in your system until tomorrow so for the next 24 hours you should not:  Drive an automobile Make any legal decisions Drink any alcoholic beverage   You may resume regular meals tomorrow.  Today it is better to start with liquids and gradually work up to solid foods.  You may eat anything you prefer, but it is better to start with liquids, then soup and crackers, and gradually work up to solid foods.   Please notify your doctor immediately if you have any unusual bleeding, trouble breathing, redness and pain at the surgery site, drainage, fever, or pain not relieved by medication.    Additional Instructions:        Please contact your physician with any problems or Same Day Surgery at [phone], Monday through Friday 6 am to 4 pm, or  at [facility] number at [phone].

## 2022-01-08 NOTE — H&P (Signed)
Delayed entry. Performed at ~1115am.   Paper H&P to be scanned into permanent record. H&P reviewed. No significant changes noted.

## 2022-01-08 NOTE — Transfer of Care (Signed)
Immediate Anesthesia Transfer of Care Note  Patient: Janet Olson  Procedure(s) Performed: Right shoulder arthroscopic subscapularis repair, rotator cuff repair, subacromial decompression, and distal clavicle excision (Right)  Patient Location: PACU  Anesthesia Type:GA combined with regional for post-op pain  Level of Consciousness: awake and alert   Airway & Oxygen Therapy: Patient Spontanous Breathing and Patient connected to face mask oxygen  Post-op Assessment: Report given to RN and Post -op Vital signs reviewed and stable  Post vital signs: Reviewed  Last Vitals:  Vitals Value Taken Time  BP 156/76 01/08/22 1500  Temp 37.1 C 01/08/22 1500  Pulse 70 01/08/22 1506  Resp 15 01/08/22 1506  SpO2 100 % 01/08/22 1506  Vitals shown include unvalidated device data.  Last Pain:  Vitals:   01/08/22 1500  TempSrc: Temporal  PainSc: Asleep         Complications: No notable events documented.

## 2022-01-08 NOTE — Anesthesia Procedure Notes (Signed)
Anesthesia Regional Block: Interscalene brachial plexus block   Pre-Anesthetic Checklist: , timeout performed,  Correct Patient, Correct Site, Correct Laterality,  Correct Procedure, Correct Position, site marked,  Risks and benefits discussed,  Surgical consent,  Pre-op evaluation,  At surgeon's request and post-op pain management  Laterality: Upper and Right  Prep: chloraprep       Needles:  Injection technique: Single-shot  Needle Type: Stimiplex     Needle Length: 9cm  Needle Gauge: 22     Additional Needles:   Procedures:,,,, ultrasound used (permanent image in chart),,    Narrative:  Start time: 01/08/2022 11:14 AM End time: 01/08/2022 11:18 AM Injection made incrementally with aspirations every 20 mL.  Performed by: Personally  Anesthesiologist: Iran Ouch, MD  Additional Notes: Patient consented for risk and benefits of nerve block including but not limited to nerve damage, failed block, bleeding and infection.  Patient voiced understanding.  Functioning IV was confirmed and monitors were applied.  Timeout done prior to procedure and prior to any sedation being given to the patient.  Patient confirmed procedure site prior to any sedation given to the patient.  A 72mm 22ga Stimuplex needle was used. Sterile prep,hand hygiene and sterile gloves were used.  Minimal sedation used for procedure.  No paresthesia endorsed by patient during the procedure.  Negative aspiration and negative test dose prior to incremental administration of local anesthetic. The patient tolerated the procedure well with no immediate complications.

## 2022-01-08 NOTE — Anesthesia Procedure Notes (Signed)
Procedure Name: Intubation Date/Time: 01/08/2022 11:43 AM Performed by: Loletha Grayer, CRNA Pre-anesthesia Checklist: Patient identified, Emergency Drugs available, Suction available, Patient being monitored and Timeout performed Oxygen Delivery Method: Circle system utilized Preoxygenation: Pre-oxygenation with 100% oxygen Induction Type: IV induction Ventilation: Mask ventilation without difficulty Laryngoscope Size: Mac and 3 Grade View: Grade I Tube type: Oral Tube size: 7.0 mm Number of attempts: 1 Airway Equipment and Method: Oral airway Placement Confirmation: ETT inserted through vocal cords under direct vision, positive ETCO2 and breath sounds checked- equal and bilateral Secured at: 21 cm

## 2022-01-08 NOTE — Anesthesia Preprocedure Evaluation (Addendum)
Anesthesia Evaluation  Patient identified by MRN, date of birth, ID band Patient awake    Reviewed: Allergy & Precautions, NPO status , Patient's Chart, lab work & pertinent test results, reviewed documented beta blocker date and time   History of Anesthesia Complications Negative for: history of anesthetic complications  Airway Mallampati: III  TM Distance: >3 FB Neck ROM: Full    Dental  (+) Poor Dentition, Missing,    Pulmonary shortness of breath, asthma , neg sleep apnea, COPD,  COPD inhaler, Current SmokerPatient did not abstain from smoking.,    - rhonchi + wheezing      Cardiovascular Exercise Tolerance: Poor METS: < 3 Mets hypertension, Pt. on medications and Pt. on home beta blockers +CHF  (-) CAD, (-) Past MI, (-) Cardiac Stents and (-) CABG  Rhythm:Regular Rate:Normal - Systolic murmurs and - Diastolic murmurs Follows with cardiology and has clearance   Neuro/Psych  Headaches, neg Seizures PSYCHIATRIC DISORDERS Anxiety Depression    GI/Hepatic Neg liver ROS, GERD  Controlled,Ascites during hospitalization in November 2021   Endo/Other  negative endocrine ROSneg diabetes  Renal/GU ESRF and DialysisRenal disease     Musculoskeletal  (+) Arthritis ,   Abdominal (+) + obese,   Peds  Hematology  (+) anemia ,   Anesthesia Other Findings Complete tear of right rotator cuff, unspecified whether traumatic    ECHO 2020:  Left ventricular hypertrophy - mild   Normal left ventricular systolic function, ejection fraction 55 to 60%   Degenerative mitral valve disease   Dilated left atrium - mild   Dilated pulmonary artery - mild   Dilated right ventricle - mild to moderate   Mildly decreased right ventricular systolic function   Tricuspid regurgitation - mild   Elevated pulmonary artery systolic pressure - moderate   Dilated right atrium - moderate   Elevated right atrial pressure    Reproductive/Obstetrics                           Anesthesia Physical  Anesthesia Plan  ASA: 3  Anesthesia Plan: General   Post-op Pain Management: Regional block   Induction: Intravenous  PONV Risk Score and Plan: 1 and Ondansetron, Dexamethasone and Midazolam  Airway Management Planned: Oral ETT  Additional Equipment:   Intra-op Plan:   Post-operative Plan:   Informed Consent: I have reviewed the patients History and Physical, chart, labs and discussed the procedure including the risks, benefits and alternatives for the proposed anesthesia with the patient or authorized representative who has indicated his/her understanding and acceptance.     Dental advisory given  Plan Discussed with: CRNA and Anesthesiologist  Anesthesia Plan Comments:        Anesthesia Quick Evaluation

## 2022-01-08 NOTE — Op Note (Addendum)
SURGERY DATE: 01/08/2022   PRE-OP DIAGNOSIS:  1. Right subacromial impingement 2. Right biceps tendinopathy 3. Right traumatic rotator cuff tear (subscapularis and supraspinatus) 4.  Right acromioclavicular joint arthritis  POST-OP DIAGNOSIS: 1. Right subacromial impingement 2. Right proximal biceps rupture 3. Right rotator cuff tear 4.  Right acromioclavicular joint arthritis   PROCEDURES:  1. Right arthroscopic rotator cuff repair (subscapularis and supraspinatus) 2. Right arthroscopic distal clavicle excision 3. Right arthroscopic subacromial decompression 4. Right arthroscopic extensive debridement of shoulder (glenohumeral and subacromial spaces)   SURGEON: Cato Mulligan, MD   ASSISTANT: Anitra Lauth, PA; Durwin Glaze, PA-S    ANESTHESIA: Gen with Exparel interscalene block   ESTIMATED BLOOD LOSS: 5cc   DRAINS:  none   TOTAL IV FLUIDS: per anesthesia      SPECIMENS: none   IMPLANTS:  - Arthrex 4.30mm SwiveLock x 3   OPERATIVE FINDINGS:  Examination under anesthesia: A careful examination under anesthesia was performed.  Passive range of motion was: FF: 150; ER at side: 70; ER in abduction: 110; IR in abduction: 45.  Anterior load shift: NT.  Posterior load shift: NT.  Sulcus in neutral: NT.  Sulcus in ER: NT.     Intra-operative findings: A thorough arthroscopic examination of the shoulder was performed.  The findings are: 1. Biceps tendon: Proximal biceps rupture 2. Superior labrum: erythema 3. Posterior labrum and capsule: normal 4. Inferior capsule and inferior recess: normal 5. Glenoid cartilage surface: Normal 6. Supraspinatus attachment: full-thickness tear of the supraspinatus 7. Posterior rotator cuff attachment: normal 8. Humeral head articular cartilage: normal 9. Rotator interval: significant synovitis 10: Subscapularis tendon: Full-thickness tear of the subscapularis 11. Anterior labrum: Mildly degenerative 12. IGHL: normal   OPERATIVE REPORT:     Indications for procedure:  ANJELIQUE MAKAR is a 52 y.o. female with approximately 6 months of right shoulder pain after a TV fell onto her right upper extremity while undergoing dialysis.  She has had difficulty with overhead motion since that time with sensations of weakness and significant pain. Clinical exam and MRI were suggestive of rotator cuff tear involving both the subscapularis and supraspinatus, biceps tendinopathy, AC joint arthritis, and subacromial impingement. After discussion of risks, benefits, and alternatives to surgery, the patient elected to proceed.    Procedure in detail:   I identified WARDA MCQUEARY in the pre-operative holding area.  I marked the operative shoulder with my initials. I reviewed the risks and benefits of the proposed surgical intervention, and the patient wished to proceed.  Anesthesia was then performed with an Exparel interscalene block.  The patient was transferred to the operative suite and placed in the beach chair position.     Appropriate IV antibiotics were administered prior to incision. The operative upper extremity was then prepped and draped in standard fashion. A time out was performed confirming the correct extremity, correct patient, and correct procedure.    I then created a standard posterior portal with an 11 blade. The glenohumeral joint was easily entered with a blunt trocar and the arthroscope introduced. The findings of diagnostic arthroscopy are described above.  I first debrided the remnant of the proximal biceps down to a stable base of the biceps anchor complex using combination of an ArthroCare wand and oscillating shaver.  I debrided degenerative tissue including the synovitic tissue about the rotator interval and anterior and superior labrum. I then coagulated the inflamed synovium to obtain hemostasis and reduce the risk of post-operative swelling using an Arthrocare radiofrequency  device.   The subscapularis tear was  identified.  A superior anterolateral portal was made under needle localization.  A 7 mm cannula was placed.  The, comma tissue indicating the superolateral border of the subscapularis was identified readily.  The tip of the coracoid as well as the conjoined tendon and coracoacromial ligaments were visualized after debriding rotator interval tissue.  Tissue about the subscapularis was released anteriorly, superiorly, and posteriorly to allow for improved mobilization.  The comma tissue was tagged with a FiberWire suture and tension on this allowed for appropriate mobilization of the subscapularis.  The lesser tuberosity footprint was prepared with a combination of electrocautery and burr. 2 suture tapes were passed in a mattress fashion.  The traction stitch was removed.  All 4 strands of suture were then loaded onto a 4.75 mm SwiveLock anchor and placed into the prepared footprint with the arm in a neutral position.  This construct appropriately reduced the subscapularis tear.  The arm was then internally and externally rotated and the subscapularis was noted to move appropriately with rotation.  The remainder of the suture was then cut.   Next, the arthroscope was then introduced into the subacromial space. A direct lateral portal was created with an 11-blade after spinal needle localization. An extensive subacromial bursectomy and debridement was performed using a combination of the shaver and Arthrocare wand. The entire acromial undersurface was exposed and the CA ligament was subperiosteally elevated to expose the anterior acromial hook. A burr was used to create a flat anterior and lateral aspect of the acromion, converting it from a Type 2 to a Type 1 acromion. Care was made to keep the deltoid fascia intact.   I then turned my attention to the arthroscopic distal clavicle excision. I identified the acromioclavicular joint. Surrounding bursal tissue was debrided and the edges of the joint were identified.  I used the 5.73mm barrel burr to remove the distal clavicle parallel to the edge of the acromion. I was able to fit two widths of the burr into the space between the distal clavicle and acromion, signifying that I had removed ~72mm of distal clavicle. This was confirmed by viewing anteriorly and introducing a probe with measuring marks from the lateral portal.   Next I turned my attention to the supraspinatus repair.  I debrided the poor quality edges of the supraspinatus tendon.  This was a U-shaped tear of the anterior supraspinatus.  I prepared the footprint using a burr to expose bleeding bone.    I then percutaneously placed 1 Iconix SPEED medial row anchor at the articular margin.  However, this did not achieve appropriate fixation.  Therefore, the sutures were passed through an Arthrex 4.75 mm SwiveLock anchor.  This was impacted into the same location and achieved excellent fixation.  I then shuttled all 4 strands of tape and the 2 strands of FiberWire suture through the rotator cuff just lateral to the musculotendinous junction using a FirstPass suture passer spanning the anterior to posterior extent of the tear. All 6 strands of suture were passed through an [employer] anchor.  This was placed approximately 2 cm distal to the lateral edge of the footprint in line with the midportion of the tear with appropriate tensioning of each suture prior to final fixation. There was 1 small dogear centrally.  The knotless mechanism of the SwiveLock anchor was utilized to reduce the dogear. This construct allowed for excellent reapproximation of the rotator cuff to its native footprint without undue tension.  Appropriate compression was achieved.  The repair was stable to external and internal rotation.   Fluid was evacuated from the shoulder, and the portals were closed with 3-0 Nylon. Xeroform was applied to the portals. A sterile dressing was applied, followed by a Polar Care sleeve and a SlingShot  shoulder immobilizer/sling. The patient was awakened from anesthesia without difficulty and was transferred to the PACU in stable condition.    Additionally, this case had increased complexity compared to standard arthroscopic rotator cuff repair given the involvement of the subscapularis tear. Repair of this tear increased surgical time by 45 minutes and increased complexity due to additional preparation and repair of subscapularis tear, use of additional implants, and creation of an accessory portal, all of which would have otherwise not occurred.  Of note, assistance from a PA was essential to performing the surgery.  PA was present for the entire surgery.  PA assisted with patient positioning, retraction, instrumentation, and wound closure. The surgery would have been more difficult and had longer operative time without PA assistance.   COMPLICATIONS: none   DISPOSITION: plan for discharge home after recovery in PACU     POSTOPERATIVE PLAN: Remain in sling (except hygiene and elbow/wrist/hand RoM exercises as instructed by PT) x 6 weeks and NWB for this time. PT to begin 3-4 days after surgery.  Large rotator cuff repair rehab protocol with subscapularis restrictions. ASA 325mg  daily x 2 weeks for DVT ppx.

## 2022-01-09 ENCOUNTER — Encounter: Payer: Self-pay | Admitting: Orthopedic Surgery

## 2022-01-13 NOTE — Anesthesia Postprocedure Evaluation (Signed)
Anesthesia Post Note  Patient: Janet Olson  Procedure(s) Performed: Right shoulder arthroscopic subscapularis repair, rotator cuff repair, subacromial decompression, and distal clavicle excision (Right)  Patient location during evaluation: PACU Anesthesia Type: General Level of consciousness: awake and alert Pain management: pain level controlled Vital Signs Assessment: post-procedure vital signs reviewed and stable Respiratory status: spontaneous breathing, nonlabored ventilation, respiratory function stable and patient connected to nasal cannula oxygen Cardiovascular status: blood pressure returned to baseline and stable Postop Assessment: no apparent nausea or vomiting Anesthetic complications: no   No notable events documented.   Last Vitals:  Vitals:   01/08/22 1615 01/08/22 1702  BP: (!) 154/66 (!) 151/92  Pulse: 70 75  Resp: 17 16  Temp: 36.8 C (!) 35.8 C  SpO2: 92% 92%    Last Pain:  Vitals:   01/09/22 0917  TempSrc:   PainSc: 0-No pain                 Martha Clan

## 2022-01-17 NOTE — Progress Notes (Signed)
End stage renal disease

## 2022-02-13 ENCOUNTER — Inpatient Hospital Stay: Payer: Medicare Other

## 2022-02-13 ENCOUNTER — Encounter: Payer: Self-pay | Admitting: Oncology

## 2022-02-13 ENCOUNTER — Inpatient Hospital Stay: Payer: Medicare Other | Attending: Oncology | Admitting: Oncology

## 2022-02-13 ENCOUNTER — Other Ambulatory Visit: Payer: Self-pay

## 2022-02-13 ENCOUNTER — Encounter (INDEPENDENT_AMBULATORY_CARE_PROVIDER_SITE_OTHER): Payer: Self-pay

## 2022-02-13 VITALS — BP 152/95 | HR 75 | Temp 97.9°F | Resp 16 | Ht 66.0 in | Wt 191.0 lb

## 2022-02-13 DIAGNOSIS — N186 End stage renal disease: Secondary | ICD-10-CM | POA: Diagnosis present

## 2022-02-13 DIAGNOSIS — R5383 Other fatigue: Secondary | ICD-10-CM | POA: Diagnosis not present

## 2022-02-13 DIAGNOSIS — N189 Chronic kidney disease, unspecified: Secondary | ICD-10-CM

## 2022-02-13 DIAGNOSIS — I132 Hypertensive heart and chronic kidney disease with heart failure and with stage 5 chronic kidney disease, or end stage renal disease: Secondary | ICD-10-CM | POA: Insufficient documentation

## 2022-02-13 DIAGNOSIS — E785 Hyperlipidemia, unspecified: Secondary | ICD-10-CM | POA: Insufficient documentation

## 2022-02-13 DIAGNOSIS — D649 Anemia, unspecified: Secondary | ICD-10-CM

## 2022-02-13 DIAGNOSIS — D631 Anemia in chronic kidney disease: Secondary | ICD-10-CM

## 2022-02-13 DIAGNOSIS — Z992 Dependence on renal dialysis: Secondary | ICD-10-CM | POA: Diagnosis not present

## 2022-02-13 LAB — COMPREHENSIVE METABOLIC PANEL
ALT: 17 U/L (ref 0–44)
AST: 29 U/L (ref 15–41)
Albumin: 3.8 g/dL (ref 3.5–5.0)
Alkaline Phosphatase: 232 U/L — ABNORMAL HIGH (ref 38–126)
Anion gap: 17 — ABNORMAL HIGH (ref 5–15)
BUN: 52 mg/dL — ABNORMAL HIGH (ref 6–20)
CO2: 27 mmol/L (ref 22–32)
Calcium: 9 mg/dL (ref 8.9–10.3)
Chloride: 92 mmol/L — ABNORMAL LOW (ref 98–111)
Creatinine, Ser: 7.71 mg/dL — ABNORMAL HIGH (ref 0.44–1.00)
GFR, Estimated: 6 mL/min — ABNORMAL LOW (ref >60)
Glucose, Bld: 101 mg/dL — ABNORMAL HIGH (ref 70–99)
Potassium: 3.6 mmol/L (ref 3.5–5.1)
Sodium: 136 mmol/L (ref 135–145)
Total Bilirubin: 0.5 mg/dL (ref 0.3–1.2)
Total Protein: 8.8 g/dL — ABNORMAL HIGH (ref 6.5–8.1)

## 2022-02-13 LAB — CBC WITH DIFFERENTIAL/PLATELET
Abs Immature Granulocytes: 0.06 10*3/uL (ref 0.00–0.07)
Basophils Absolute: 0 10*3/uL (ref 0.0–0.1)
Basophils Relative: 1 %
Eosinophils Absolute: 0.2 10*3/uL (ref 0.0–0.5)
Eosinophils Relative: 3 %
HCT: 35.3 % — ABNORMAL LOW (ref 36.0–46.0)
Hemoglobin: 11 g/dL — ABNORMAL LOW (ref 12.0–15.0)
Immature Granulocytes: 1 %
Lymphocytes Relative: 13 %
Lymphs Abs: 0.7 10*3/uL (ref 0.7–4.0)
MCH: 27.6 pg (ref 26.0–34.0)
MCHC: 31.2 g/dL (ref 30.0–36.0)
MCV: 88.7 fL (ref 80.0–100.0)
Monocytes Absolute: 0.7 10*3/uL (ref 0.1–1.0)
Monocytes Relative: 12 %
Neutro Abs: 3.9 10*3/uL (ref 1.7–7.7)
Neutrophils Relative %: 70 %
Platelets: 205 10*3/uL (ref 150–400)
RBC: 3.98 MIL/uL (ref 3.87–5.11)
RDW: 18.3 % — ABNORMAL HIGH (ref 11.5–15.5)
WBC: 5.5 10*3/uL (ref 4.0–10.5)
nRBC: 0 % (ref 0.0–0.2)

## 2022-02-13 LAB — TSH: TSH: 1.001 u[IU]/mL (ref 0.350–4.500)

## 2022-02-13 LAB — FERRITIN: Ferritin: 438 ng/mL — ABNORMAL HIGH (ref 11–307)

## 2022-02-13 LAB — RETICULOCYTES
Immature Retic Fract: 25.1 % — ABNORMAL HIGH (ref 2.3–15.9)
RBC.: 3.98 MIL/uL (ref 3.87–5.11)
Retic Count, Absolute: 136.9 10*3/uL (ref 19.0–186.0)
Retic Ct Pct: 3.4 % — ABNORMAL HIGH (ref 0.4–3.1)

## 2022-02-13 LAB — LACTATE DEHYDROGENASE: LDH: 192 U/L (ref 98–192)

## 2022-02-14 LAB — KAPPA/LAMBDA LIGHT CHAINS
Kappa free light chain: 309.8 mg/L — ABNORMAL HIGH (ref 3.3–19.4)
Kappa, lambda light chain ratio: 0.96 (ref 0.26–1.65)
Lambda free light chains: 323.9 mg/L — ABNORMAL HIGH (ref 5.7–26.3)

## 2022-02-14 LAB — HAPTOGLOBIN: Haptoglobin: 107 mg/dL (ref 33–346)

## 2022-02-15 ENCOUNTER — Encounter: Payer: Self-pay | Admitting: Oncology

## 2022-02-15 NOTE — Progress Notes (Signed)
Hematology/Oncology Consult note [facility] Telephone:([phone] [phone]  Patient Care Team: Remi Haggard, FNP as PCP - General (Family Medicine) Minna Merritts, MD as PCP - Cardiology (Cardiology) Sindy Guadeloupe, MD as Consulting Physician (Hematology and Oncology)   Name of the patient: Janet Olson  [id number]  09-Aug-1970    Reason for referral-anemia   Referring physician-Cheryl Lavena Bullion  Date of visit: 02/15/22   History of presenting illness- Patient is a 52 year old African-American female with a past medical history significant for hypertension hyperlipidemia and end-stage renal disease on dialysis.  She receives IV iron and EPO through dialysis.  She has been referred for anemia.  Her most recent labs from February 2023 showed a serum creatinine of 7.7.  We are iron saturation low at 9% with TIBC of 308.  TSH was normal.  H&H was 9.2/29 with an MCV of 87.  Platelets 196.  B12 level 727 and folate was greater than 24.  Patient reports doing well and other than ongoing fatigue she denies any other complaints at this time  ECOG PS- 1  Pain scale- 0   Review of systems- Review of Systems  Constitutional:  Positive for malaise/fatigue. Negative for chills, fever and weight loss.  HENT:  Negative for congestion, ear discharge and nosebleeds.   Eyes:  Negative for blurred vision.  Respiratory:  Negative for cough, hemoptysis, sputum production, shortness of breath and wheezing.   Cardiovascular:  Negative for chest pain, palpitations, orthopnea and claudication.  Gastrointestinal:  Negative for abdominal pain, blood in stool, constipation, diarrhea, heartburn, melena, nausea and vomiting.  Genitourinary:  Negative for dysuria, flank pain, frequency, hematuria and urgency.  Musculoskeletal:  Negative for back pain, joint pain and myalgias.  Skin:  Negative for rash.  Neurological:  Negative for dizziness, tingling, focal weakness,  seizures, weakness and headaches.  Endo/Heme/Allergies:  Does not bruise/bleed easily.  Psychiatric/Behavioral:  Negative for depression and suicidal ideas. The patient does not have insomnia.    No Known Allergies  Patient Active Problem List   Diagnosis Date Noted   Pain in joint, shoulder region 05/25/2021   Diarrhea 11/07/2020   Benign essential HTN 11/07/2020   Anxiety 11/07/2020   Depression 11/07/2020   GERD (gastroesophageal reflux disease) 11/07/2020   Infection of hemodialysis tunneled catheter ([place]) 11/07/2020   Weight loss 11/07/2020   Other ascites 11/07/2020   Ear mass, left 11/07/2020   Epigastric pain 11/07/2020   Enterococcus faecalis infection 11/03/2020   Sepsis ([place]) 11/03/2020   Right upper quadrant abdominal pain 11/02/2020   Mass of ear canal, left 11/02/2020   Abdominal ascites    Acute on chronic diastolic CHF (congestive heart failure) ([place]) 03/03/2020   Anasarca associated with disorder of kidney 03/03/2020   Hyponatremia with excess extracellular fluid volume 03/03/2020   Hyperkalemia 42/70/6237   Diastolic dysfunction with chronic heart failure ([place]) 04/28/2019   Nonrheumatic tricuspid valve regurgitation 04/28/2019   Tobacco use 04/28/2019   Pericardial effusion 01/26/2019   Shortness of breath 01/26/2019   Altered mental status 12/04/2018   Renal dialysis device, implant, or graft complication 62/83/1517   ESRD on dialysis ([place]) 08/02/2017   COPD (chronic obstructive pulmonary disease) ([place]) 06/20/2017   Anemia in chronic kidney disease 06/07/2017   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   Mass of left axilla 12/07/2013     Past Medical History:  Diagnosis Date   (HFpEF) heart failure with preserved ejection fraction ([place])    a. 2015 Echo:  Nl EF. Mild to mod LVH. Mild PR/MR; b. 04/2017 Echo: EF 55-60%, no rwma, Gr1 DD. Mildly dil LA. Small effusion; c. 12/2018 Echo: EF 55-60%, mildly dil LA/PA. Mod dil RA. Mild to mod RV dil. Mildly  reduced RV fxn. Mild TR. Mod elev PASP.   Abdominal ascites    Acute hypoxemic respiratory failure ([place]) 01/03/2019   Acute renal failure (ARF) ([place]) 05/17/2017   Acute respiratory failure ([place]) 09/25/2018   Anemia    Anxiety    Arthritis    Asthma    Cardiomegaly    CHF (congestive heart failure) (HCC)    COPD (chronic obstructive pulmonary disease) (HCC)    Depression    Dialysis patient ([place])    MON, WED , FRI   Dyspnea    ESRD (end stage renal disease) (HCC)    GERD (gastroesophageal reflux disease)    Headache    Migraines   Hypertension    Nonrheumatic tricuspid (valve) insufficiency    Respiratory failure with hypoxia ([place]) 04/21/2018   Ulcer    gastric   Vitamin D deficiency      Past Surgical History:  Procedure Laterality Date   A/V FISTULAGRAM Left 04/17/2018   Procedure: A/V FISTULAGRAM;  Surgeon: Algernon Huxley, MD;  Location: [facility];  Service: Cardiovascular;  Laterality: Left;   A/V FISTULAGRAM Left 05/27/2018   Procedure: A/V FISTULAGRAM;  Surgeon: Katha Cabal, MD;  Location: [facility];  Service: Cardiovascular;  Laterality: Left;   A/V FISTULAGRAM Left 07/05/2020   Procedure: A/V FISTULAGRAM;  Surgeon: Katha Cabal, MD;  Location: [facility] [facility];  Service: Cardiovascular;  Laterality: Left;   AV FISTULA PLACEMENT Left 08/08/2017   Procedure: ARTERIOVENOUS (AV) FISTULA CREATION ( BRACHIOCEPHALIC );  Surgeon: Algernon Huxley, MD;  Location: [facility];  Service: Vascular;  Laterality: Left;   AV FISTULA PLACEMENT Left 08/05/2020   Procedure: INSERTION OF ARTERIOVENOUS (AV) GRAFT ARM(BRACHIAL AXILLARY);  Surgeon: Katha Cabal, MD;  Location: [facility];  Service: Vascular;  Laterality: Left;   CESAREAN SECTION     x 4 (1988, 1991, 1997, 2000 )   COLONOSCOPY WITH PROPOFOL N/A 04/24/2018   Procedure: COLONOSCOPY WITH PROPOFOL;  Surgeon: Lin Landsman, MD;  Location[facility];  Service: Gastroenterology;  Laterality:  N/A;   DIALYSIS/PERMA CATHETER INSERTION N/A 07/16/2017   Procedure: Dialysis/Perma Catheter Insertion;  Surgeon: Katha Cabal, MD;  Location: [place] [facility];  Service: Cardiovascular;  Laterality: N/A;   DIALYSIS/PERMA CATHETER REMOVAL N/A 10/29/2017   Procedure: DIALYSIS/PERMA CATHETER REMOVAL;  Surgeon: Katha Cabal, MD;  Location: [facility];  Service: Cardiovascular;  Laterality: N/A;   DIALYSIS/PERMA CATHETER REMOVAL N/A 11/03/2020   Procedure: DIALYSIS/PERMA CATHETER REMOVAL;  Surgeon: Algernon Huxley, MD;  Location: [place] [facility];  Service: Cardiovascular;  Laterality: N/A;   excision of lymph nodes Bilateral 2014   bilateral under arms.   PARATHYROID IMPLANT REMOVAL     REMOVAL OF A DIALYSIS CATHETER Left 08/05/2020   Procedure: REMOVAL OF A DIALYSIS CATHETER ( FISTULA );  Surgeon: Katha Cabal, MD;  Location: [facility];  Service: Vascular;  Laterality: Left;   SHOULDER ARTHROSCOPY WITH SUBACROMIAL DECOMPRESSION AND OPEN ROTATOR C Right 01/08/2022   Procedure: Right shoulder arthroscopic subscapularis repair, rotator cuff repair, subacromial decompression, and distal clavicle excision;  Surgeon: Leim Fabry, MD;  Location: [facility];  Service: Orthopedics;  Laterality: Right;    Social History   Socioeconomic History  Marital status: Single    Spouse name: Not on file   Number of children: Not on file   Years of education: Not on file   Highest education level: Not on file  Occupational History   Not on file  Tobacco Use   Smoking status: Every Day    Packs/day: 0.50    Years: 30.00    Pack years: 15.00    Types: Cigarettes   Smokeless tobacco: Never  Vaping Use   Vaping Use: Never used  Substance and Sexual Activity   Alcohol use: No   Drug use: No   Sexual activity: Not Currently  Other Topics Concern   Not on file  Social History Narrative   Not on file   Social Determinants of Health   Financial Resource Strain: Not on file   Food Insecurity: Not on file  Transportation Needs: Not on file  Physical Activity: Not on file  Stress: Not on file  Social Connections: Not on file  Intimate Partner Violence: Not on file     Family History  Problem Relation Age of Onset   Diabetes Mother      Current Outpatient Medications:    acetaminophen (TYLENOL) 500 MG tablet, Take 2 tablets (1,000 mg total) by mouth every 8 (eight) hours., Disp: 90 tablet, Rfl: 2   albuterol (PROVENTIL) (2.5 MG/3ML) 0.083% nebulizer solution, Take 2.5 mg by nebulization every 6 (six) hours as needed for wheezing or shortness of breath. , Disp: , Rfl:    albuterol (VENTOLIN HFA) 108 (90 Base) MCG/ACT inhaler, Inhale 2 puffs into the lungs every 6 (six) hours as needed for wheezing or shortness of breath. , Disp: , Rfl:    amLODipine (NORVASC) 10 MG tablet, Take 1 tablet (10 mg total) by mouth daily., Disp: 30 tablet, Rfl: 11   AURYXIA 1 GM 210 MG(Fe) tablet, Take 210 mg by mouth 2 (two) times daily with a meal., Disp: , Rfl:    DULERA 200-5 MCG/ACT AERO, Inhale 2 puffs into the lungs 2 (two) times daily., Disp: , Rfl: 0   DULoxetine (CYMBALTA) 60 MG capsule, Take 60 mg by mouth 2 (two) times daily., Disp: , Rfl:    hydrOXYzine (ATARAX/VISTARIL) 25 MG tablet, Take 25 mg by mouth in the morning., Disp: , Rfl:    metoprolol tartrate (LOPRESSOR) 50 MG tablet, Take 1 tablet (50 mg total) by mouth 2 (two) times daily., Disp: 180 tablet, Rfl: 3   multivitamin (RENA-VIT) TABS tablet, Take 1 tablet by mouth daily. , Disp: , Rfl: 1   ondansetron (ZOFRAN-ODT) 4 MG disintegrating tablet, Take 1 tablet (4 mg total) by mouth every 8 (eight) hours as needed for nausea or vomiting., Disp: 20 tablet, Rfl: 0   oxyCODONE (ROXICODONE) 5 MG immediate release tablet, Take 1-2 tablets (5-10 mg total) by mouth every 4 (four) hours as needed (pain)., Disp: 30 tablet, Rfl: 0   pantoprazole (PROTONIX) 40 MG tablet, Take 1 tablet (40 mg total) by mouth daily., Disp: 30  tablet, Rfl: 0   QUEtiapine (SEROQUEL) 100 MG tablet, Take 100 mg by mouth at bedtime., Disp: , Rfl:    QUEtiapine (SEROQUEL) 25 MG tablet, Take 25 mg by mouth at bedtime., Disp: , Rfl:    torsemide (DEMADEX) 100 MG tablet, Take 1 tablet (100 mg total) by mouth daily., Disp: 30 tablet, Rfl: 0   Vitamin D, Ergocalciferol, (DRISDOL) 50000 units CAPS capsule, Take 50,000 Units by mouth every Friday., Disp: , Rfl: 0   benzonatate (TESSALON)  200 MG capsule, Take 200 mg by mouth 2 (two) times daily as needed for cough. (Patient not taking: Reported on 02/13/2022), Disp: , Rfl:    chlorpheniramine-HYDROcodone (TUSSIONEX) 10-8 MG/5ML SUER, Take 5 mLs by mouth daily as needed for cough.  (Patient not taking: Reported on 02/13/2022), Disp: , Rfl:    docusate sodium (COLACE) 100 MG capsule, Take 100 mg by mouth daily as needed for moderate constipation.  (Patient not taking: Reported on 02/13/2022), Disp: , Rfl:    lidocaine-prilocaine (EMLA) cream, 1 application as needed (prior to port being accessed). (Patient not taking: Reported on 02/13/2022), Disp: , Rfl:    linaclotide (LINZESS) 72 MCG capsule, Take 72 mcg by mouth daily as needed (constipation.). (Patient not taking: Reported on 02/13/2022), Disp: , Rfl:    neomycin-polymyxin-hydrocortisone (CORTISPORIN) 3.5-10000-1 OTIC suspension, Place 4 drops into both ears daily as needed (ear itchy relief). (Patient not taking: Reported on 02/13/2022), Disp: , Rfl:    traMADol (ULTRAM) 50 MG tablet, Take 100 mg by mouth at bedtime as needed (PAIN.). (Patient not taking: Reported on 02/13/2022), Disp: , Rfl:    Physical exam:  Vitals:   02/13/22 1328  BP: (!) 152/95  Pulse: 75  Resp: 16  Temp: 97.9 F (36.6 C)  TempSrc: Tympanic  SpO2: 93%  Weight: 191 lb (86.6 kg)  Height: 5' 6" (1.676 m)   Physical Exam Constitutional:      General: She is not in acute distress. Cardiovascular:     Rate and Rhythm: Normal rate and regular rhythm.     Heart sounds:  Normal heart sounds.  Pulmonary:     Effort: Pulmonary effort is normal.     Breath sounds: Normal breath sounds.  Abdominal:     General: Bowel sounds are normal.     Palpations: Abdomen is soft.  Skin:    General: Skin is warm and dry.  Neurological:     Mental Status: She is alert and oriented to person, place, and time.       CMP Latest Ref Rng & Units 02/13/2022  Glucose 70 - 99 mg/dL 101(H)  BUN 6 - 20 mg/dL 52(H)  Creatinine 0.44 - 1.00 mg/dL 7.71(H)  Sodium 135 - 145 mmol/L 136  Potassium 3.5 - 5.1 mmol/L 3.6  Chloride 98 - 111 mmol/L 92(L)  CO2 22 - 32 mmol/L 27  Calcium 8.9 - 10.3 mg/dL 9.0  Total Protein 6.5 - 8.1 g/dL 8.8(H)  Total Bilirubin 0.3 - 1.2 mg/dL 0.5  Alkaline Phos 38 - 126 U/L 232(H)  AST 15 - 41 U/L 29  ALT 0 - 44 U/L 17   CBC Latest Ref Rng & Units 02/13/2022  WBC 4.0 - 10.5 K/uL 5.5  Hemoglobin 12.0 - 15.0 g/dL 11.0(L)  Hematocrit 36.0 - 46.0 % 35.3(L)  Platelets 150 - 400 K/uL 205    No images are attached to the encounter.  No results found.  Assessment and plan- Patient is a 52 y.o. female referred for normocytic anemia likely secondary to anemia of chronic kidney disease  Patient has had work-up done including a TSH B12 and folate recently which was normal.  I will do other work-up including CBC, CMP, myeloma panel, serum free light chains, LDH reticulocyte haptoglobin as well as ferritin and iron studies.  If there is no other reversible cause of anemia found then I will get in touch with nephrology to see if they can increase the dose of her EPO with dialysis.  No indication for bone  marrow biopsy at this time I will see her back in 2 weeks time for in person or video visit   Thank you for this kind referral and the opportunity to participate in the care of this patient   Visit Diagnosis 1. Normocytic anemia   2. Anemia of chronic renal failure, unspecified CKD stage     Dr. Randa Evens, MD, MPH [facility] [phone] 02/15/2022

## 2022-02-16 LAB — MULTIPLE MYELOMA PANEL, SERUM
Albumin SerPl Elph-Mcnc: 4 g/dL (ref 2.9–4.4)
Albumin/Glob SerPl: 1 (ref 0.7–1.7)
Alpha 1: 0.3 g/dL (ref 0.0–0.4)
Alpha2 Glob SerPl Elph-Mcnc: 0.8 g/dL (ref 0.4–1.0)
B-Globulin SerPl Elph-Mcnc: 1.2 g/dL (ref 0.7–1.3)
Gamma Glob SerPl Elph-Mcnc: 1.8 g/dL (ref 0.4–1.8)
Globulin, Total: 4.1 g/dL — ABNORMAL HIGH (ref 2.2–3.9)
IgA: 876 mg/dL — ABNORMAL HIGH (ref 87–352)
IgG (Immunoglobin G), Serum: 1734 mg/dL — ABNORMAL HIGH (ref 586–1602)
IgM (Immunoglobulin M), Srm: 28 mg/dL (ref 26–217)
Total Protein ELP: 8.1 g/dL (ref 6.0–8.5)

## 2022-03-02 ENCOUNTER — Inpatient Hospital Stay: Payer: Medicare Other | Attending: Oncology | Admitting: Oncology

## 2022-03-02 ENCOUNTER — Other Ambulatory Visit: Payer: Self-pay

## 2022-03-02 ENCOUNTER — Encounter: Payer: Self-pay | Admitting: Oncology

## 2022-03-02 VITALS — BP 110/62 | HR 68 | Temp 98.7°F | Resp 19 | Wt 192.7 lb

## 2022-03-02 DIAGNOSIS — Z992 Dependence on renal dialysis: Secondary | ICD-10-CM | POA: Diagnosis not present

## 2022-03-02 DIAGNOSIS — N186 End stage renal disease: Secondary | ICD-10-CM | POA: Insufficient documentation

## 2022-03-02 DIAGNOSIS — D631 Anemia in chronic kidney disease: Secondary | ICD-10-CM | POA: Insufficient documentation

## 2022-03-02 DIAGNOSIS — N189 Chronic kidney disease, unspecified: Secondary | ICD-10-CM | POA: Diagnosis not present

## 2022-03-02 DIAGNOSIS — Z862 Personal history of diseases of the blood and blood-forming organs and certain disorders involving the immune mechanism: Secondary | ICD-10-CM | POA: Diagnosis not present

## 2022-03-02 NOTE — Progress Notes (Signed)
Patient states she is having right arm pain because just left PT. Patient states she is having stomach pain and is constipated.  ?

## 2022-03-08 NOTE — Progress Notes (Signed)
Hematology/Oncology Consult note [facility]  [phone] Fax:([phone]  Patient Care Team: Remi Haggard, FNP as PCP - General (Family Medicine) Minna Merritts, MD as PCP - Cardiology (Cardiology) Sindy Guadeloupe, MD as Consulting Physician (Hematology and Oncology)   Name of the patient: Janet Olson  [id number]  [birth year]   Date of visit: 03/08/22  Diagnosis- anemia likely secondary to kidney disease  Chief complaint/ Reason for visit-discussed results of blood work  Heme/Onc history: Patient is a 52 year old African-American female with a past medical history significant for hypertension hyperlipidemia and end-stage renal disease on dialysis.  She receives IV iron and EPO through dialysis.  She has been referred for anemia.  Her most recent labs from February 2023 showed a serum creatinine of 7.7.  We are iron saturation low at 9% with TIBC of 308.  TSH was normal.  H&H was 9.2/29 with an MCV of 87.  Platelets 196.  B12 level 727 and folate was greater than 24.  Patient reports doing well and other than ongoing fatigue she denies any other complaints at this time  Results of blood work from 02/13/2022 were as follows: CBC showed H&H of 11/35.3 with a normal white count and platelet count.  Ferritin levels were elevated at 438 reticulocyte count was normal at 3.4.  Haptoglobin normal.  Myeloma panel showed polyclonal increase in immunoglobulins.  TSH normal.  Both kappa and lambda light chains were elevated with a normal light chain ratio of 0.96.  LDH normal at 192.  Interval history-he has chronic fatigue but denies other complaints at this time  ECOG PS- 1 Pain scale- 0   Review of systems- Review of Systems  Constitutional:  Positive for malaise/fatigue. Negative for chills, fever and weight loss.  HENT:  Negative for congestion, ear discharge and nosebleeds.   Eyes:  Negative for blurred vision.  Respiratory:  Negative for  cough, hemoptysis, sputum production, shortness of breath and wheezing.   Cardiovascular:  Negative for chest pain, palpitations, orthopnea and claudication.  Gastrointestinal:  Negative for abdominal pain, blood in stool, constipation, diarrhea, heartburn, melena, nausea and vomiting.  Genitourinary:  Negative for dysuria, flank pain, frequency, hematuria and urgency.  Musculoskeletal:  Negative for back pain, joint pain and myalgias.  Skin:  Negative for rash.  Neurological:  Negative for dizziness, tingling, focal weakness, seizures, weakness and headaches.  Endo/Heme/Allergies:  Does not bruise/bleed easily.  Psychiatric/Behavioral:  Negative for depression and suicidal ideas. The patient does not have insomnia.       No Known Allergies   Past Medical History:  Diagnosis Date   (HFpEF) heart failure with preserved ejection fraction ([place])    a. 2015 Echo: Nl EF. Mild to mod LVH. Mild PR/MR; b. 04/2017 Echo: EF 55-60%, no rwma, Gr1 DD. Mildly dil LA. Small effusion; c. 12/2018 Echo: EF 55-60%, mildly dil LA/PA. Mod dil RA. Mild to mod RV dil. Mildly reduced RV fxn. Mild TR. Mod elev PASP.   Abdominal ascites    Acute hypoxemic respiratory failure ([place]) 01/03/2019   Acute renal failure (ARF) ([place]) 05/17/2017   Acute respiratory failure ([place]) 09/25/2018   Anemia    Anxiety    Arthritis    Asthma    Cardiomegaly    CHF (congestive heart failure) (HCC)    COPD (chronic obstructive pulmonary disease) ([place])    Depression    Dialysis patient (HCC)    MON, WED , FRI   Dyspnea  ESRD (end stage renal disease) (HCC)    GERD (gastroesophageal reflux disease)    Headache    Migraines   Hypertension    Nonrheumatic tricuspid (valve) insufficiency    Respiratory failure with hypoxia ([place]) 04/21/2018   Ulcer    gastric   Vitamin D deficiency      Past Surgical History:  Procedure Laterality Date   A/V FISTULAGRAM Left 04/17/2018   Procedure: A/V FISTULAGRAM;  Surgeon: Algernon Huxley,  MD;  Location: [place] [facility];  Service: Cardiovascular;  Laterality: Left;   A/V FISTULAGRAM Left 05/27/2018   Procedure: A/V FISTULAGRAM;  Surgeon: Katha Cabal, MD;  Location: [place] [facility];  Service: Cardiovascular;  Laterality: Left;   A/V FISTULAGRAM Left 07/05/2020   Procedure: A/V FISTULAGRAM;  Surgeon: Katha Cabal, MD;  Location: [place] [facility];  Service: Cardiovascular;  Laterality: Left;   AV FISTULA PLACEMENT Left 08/08/2017   Procedure: ARTERIOVENOUS (AV) FISTULA CREATION ( BRACHIOCEPHALIC );  Surgeon: Algernon Huxley, MD;  Location: [facility];  Service: Vascular;  Laterality: Left;   AV FISTULA PLACEMENT Left 08/05/2020   Procedure: INSERTION OF ARTERIOVENOUS (AV) GRAFT ARM(BRACHIAL AXILLARY);  Surgeon: Katha Cabal, MD;  Location: [facility];  Service: Vascular;  Laterality: Left;   CESAREAN SECTION     x 4 (1988, 1991, 1997, 2000 )   COLONOSCOPY WITH PROPOFOL N/A 04/24/2018   Procedure: COLONOSCOPY WITH PROPOFOL;  Surgeon: Lin Landsman, MD;  Location: [facility];  Service: Gastroenterology;  Laterality: N/A;   DIALYSIS/PERMA CATHETER INSERTION N/A 07/16/2017   Procedure: Dialysis/Perma Catheter Insertion;  Surgeon: Katha Cabal, MD;  Location: [place] [facility];  Service: Cardiovascular;  Laterality: N/A;   DIALYSIS/PERMA CATHETER REMOVAL N/A 10/29/2017   Procedure: DIALYSIS/PERMA CATHETER REMOVAL;  Surgeon: Katha Cabal, MD;  Location: [place] [facility];  Service: Cardiovascular;  Laterality: N/A;   DIALYSIS/PERMA CATHETER REMOVAL N/A 11/03/2020   Procedure: DIALYSIS/PERMA CATHETER REMOVAL;  Surgeon: Algernon Huxley, MD;  Location: [place] [facility];  Service: Cardiovascular;  Laterality: N/A;   excision of lymph nodes Bilateral 2014   bilateral under arms.   PARATHYROID IMPLANT REMOVAL     REMOVAL OF A DIALYSIS CATHETER Left 08/05/2020   Procedure: REMOVAL OF A DIALYSIS CATHETER ( FISTULA );  Surgeon: Katha Cabal, MD;   Location: [facility];  Service: Vascular;  Laterality: Left;   SHOULDER ARTHROSCOPY WITH SUBACROMIAL DECOMPRESSION AND OPEN ROTATOR C Right 01/08/2022   Procedure: Right shoulder arthroscopic subscapularis repair, rotator cuff repair, subacromial decompression, and distal clavicle excision;  Surgeon: Leim Fabry, MD;  Location: [facility] OR[facility];  Service: Orthopedics;  Laterality: Right;    Social History   Socioeconomic History   Marital status: Single    Spouse name: Not on file   Number of children: Not on file   Years of education: Not on file   Highest education level: Not on file  Occupational History   Not on file  Tobacco Use   Smoking status: Every Day    Packs/day: 0.50    Years: 30.00    Pack years: 15.00    Types: Cigarettes   Smokeless tobacco: Never  Vaping Use   Vaping Use: Never used  Substance and Sexual Activity   Alcohol use: No   Drug use: No   Sexual activity: Not Currently  Other Topics Concern   Not on file  Social History Narrative   Not on file   Social Determinants of Health  Financial Resource Strain: Not on file  Food Insecurity: Not on file  Transportation Needs: Not on file  Physical Activity: Not on file  Stress: Not on file  Social Connections: Not on file  Intimate Partner Violence: Not on file    Family History  Problem Relation Age of Onset   Diabetes Mother      Current Outpatient Medications:    acetaminophen (TYLENOL) 500 MG tablet, Take 2 tablets (1,000 mg total) by mouth every 8 (eight) hours., Disp: 90 tablet, Rfl: 2   albuterol (PROVENTIL) (2.5 MG/3ML) 0.083% nebulizer solution, Take 2.5 mg by nebulization every 6 (six) hours as needed for wheezing or shortness of breath. , Disp: , Rfl:    albuterol (VENTOLIN HFA) 108 (90 Base) MCG/ACT inhaler, Inhale 2 puffs into the lungs every 6 (six) hours as needed for wheezing or shortness of breath. , Disp: , Rfl:    amLODipine (NORVASC) 10 MG tablet, Take 1 tablet (10 mg total) by mouth  daily., Disp: 30 tablet, Rfl: 11   AURYXIA 1 GM 210 MG(Fe) tablet, Take 210 mg by mouth 2 (two) times daily with a meal., Disp: , Rfl:    DULERA 200-5 MCG/ACT AERO, Inhale 2 puffs into the lungs 2 (two) times daily., Disp: , Rfl: 0   DULoxetine (CYMBALTA) 60 MG capsule, Take 60 mg by mouth 2 (two) times daily., Disp: , Rfl:    hydrOXYzine (ATARAX/VISTARIL) 25 MG tablet, Take 25 mg by mouth in the morning., Disp: , Rfl:    lidocaine-prilocaine (EMLA) cream, 1 application as needed (prior to port being accessed)., Disp: , Rfl:    linaclotide (LINZESS) 72 MCG capsule, Take 72 mcg by mouth daily as needed (constipation.)., Disp: , Rfl:    metoprolol tartrate (LOPRESSOR) 50 MG tablet, Take 1 tablet (50 mg total) by mouth 2 (two) times daily., Disp: 180 tablet, Rfl: 3   multivitamin (RENA-VIT) TABS tablet, Take 1 tablet by mouth daily. , Disp: , Rfl: 1   ondansetron (ZOFRAN-ODT) 4 MG disintegrating tablet, Take 1 tablet (4 mg total) by mouth every 8 (eight) hours as needed for nausea or vomiting., Disp: 20 tablet, Rfl: 0   oxyCODONE (ROXICODONE) 5 MG immediate release tablet, Take 1-2 tablets (5-10 mg total) by mouth every 4 (four) hours as needed (pain)., Disp: 30 tablet, Rfl: 0   pantoprazole (PROTONIX) 40 MG tablet, Take 1 tablet (40 mg total) by mouth daily., Disp: 30 tablet, Rfl: 0   QUEtiapine (SEROQUEL) 100 MG tablet, Take 100 mg by mouth at bedtime., Disp: , Rfl:    QUEtiapine (SEROQUEL) 25 MG tablet, Take 25 mg by mouth at bedtime., Disp: , Rfl:    torsemide (DEMADEX) 100 MG tablet, Take 1 tablet (100 mg total) by mouth daily., Disp: 30 tablet, Rfl: 0   Vitamin D, Ergocalciferol, (DRISDOL) 50000 units CAPS capsule, Take 50,000 Units by mouth every Friday., Disp: , Rfl: 0   benzonatate (TESSALON) 200 MG capsule, Take 200 mg by mouth 2 (two) times daily as needed for cough. (Patient not taking: Reported on 02/13/2022), Disp: , Rfl:    chlorpheniramine-HYDROcodone (TUSSIONEX) 10-8 MG/5ML SUER, Take 5  mLs by mouth daily as needed for cough.  (Patient not taking: Reported on 02/13/2022), Disp: , Rfl:    docusate sodium (COLACE) 100 MG capsule, Take 100 mg by mouth daily as needed for moderate constipation.  (Patient not taking: Reported on 02/13/2022), Disp: , Rfl:    neomycin-polymyxin-hydrocortisone (CORTISPORIN) 3.5-10000-1 OTIC suspension, Place 4 drops into both ears  daily as needed (ear itchy relief). (Patient not taking: Reported on 02/13/2022), Disp: , Rfl:    traMADol (ULTRAM) 50 MG tablet, Take 100 mg by mouth at bedtime as needed (PAIN.). (Patient not taking: Reported on 02/13/2022), Disp: , Rfl:   Physical exam:  Vitals:   03/02/22 1508  BP: 110/62  Pulse: 68  Resp: 19  Temp: 98.7 F (37.1 C)  SpO2: 98%  Weight: 192 lb 11.2 oz (87.4 kg)   Physical Exam Constitutional:      General: She is not in acute distress. Cardiovascular:     Rate and Rhythm: Normal rate and regular rhythm.     Heart sounds: Normal heart sounds.  Pulmonary:     Effort: Pulmonary effort is normal.     Breath sounds: Normal breath sounds.  Abdominal:     General: Bowel sounds are normal.     Palpations: Abdomen is soft.  Skin:    General: Skin is warm and dry.  Neurological:     Mental Status: She is alert and oriented to person, place, and time.     CMP Latest Ref Rng & Units 02/13/2022  Glucose 70 - 99 mg/dL 101(H)  BUN 6 - 20 mg/dL 52(H)  Creatinine 0.44 - 1.00 mg/dL 7.71(H)  Sodium 135 - 145 mmol/L 136  Potassium 3.5 - 5.1 mmol/L 3.6  Chloride 98 - 111 mmol/L 92(L)  CO2 22 - 32 mmol/L 27  Calcium 8.9 - 10.3 mg/dL 9.0  Total Protein 6.5 - 8.1 g/dL 8.8(H)  Total Bilirubin 0.3 - 1.2 mg/dL 0.5  Alkaline Phos 38 - 126 U/L 232(H)  AST 15 - 41 U/L 29  ALT 0 - 44 U/L 17   CBC Latest Ref Rng & Units 02/13/2022  WBC 4.0 - 10.5 K/uL 5.5  Hemoglobin 12.0 - 15.0 g/dL 11.0(L)  Hematocrit 36.0 - 46.0 % 35.3(L)  Platelets 150 - 400 K/uL 205     Assessment and plan- Patient is a 52 y.o. female  referred for normocytic anemia likely secondary to anemia of chronic kidney disease  Results of peripheral blood anemia work-up have been otherwise unremarkable.  Iron stores are adequate.  MPNT61 folate in the past have been normal.  No evidence of hemolysis.  Patient is receiving IV iron and EPO through dialysis.  Her hemoglobin is stable between 10-11.  She therefore does not require any follow-up with hematology at this time.  However if there is a consistent drop in her hemoglobin in the future she can be referred again and we could consider a bone marrow biopsy at that time   Visit Diagnosis 1. History of anemia due to chronic kidney disease      Dr. Randa Evens, MD, MPH [facility] [phone] 03/08/2022 9:42 AM

## 2022-04-11 ENCOUNTER — Emergency Department: Payer: Medicare Other

## 2022-04-11 ENCOUNTER — Inpatient Hospital Stay
Admission: EM | Admit: 2022-04-11 | Discharge: 2022-04-13 | DRG: 070 | Disposition: A | Discharge status: Home / Self Care | Payer: Medicare Other | Attending: Family Medicine | Admitting: Family Medicine

## 2022-04-11 ENCOUNTER — Other Ambulatory Visit: Payer: Self-pay

## 2022-04-11 DIAGNOSIS — Z91158 Patient's noncompliance with renal dialysis for other reason: Secondary | ICD-10-CM | POA: Diagnosis not present

## 2022-04-11 DIAGNOSIS — F32A Depression, unspecified: Secondary | ICD-10-CM | POA: Diagnosis present

## 2022-04-11 DIAGNOSIS — I5032 Chronic diastolic (congestive) heart failure: Secondary | ICD-10-CM | POA: Diagnosis present

## 2022-04-11 DIAGNOSIS — Z992 Dependence on renal dialysis: Secondary | ICD-10-CM

## 2022-04-11 DIAGNOSIS — R4182 Altered mental status, unspecified: Secondary | ICD-10-CM | POA: Diagnosis present

## 2022-04-11 DIAGNOSIS — E559 Vitamin D deficiency, unspecified: Secondary | ICD-10-CM | POA: Diagnosis present

## 2022-04-11 DIAGNOSIS — J439 Emphysema, unspecified: Secondary | ICD-10-CM | POA: Diagnosis present

## 2022-04-11 DIAGNOSIS — Z833 Family history of diabetes mellitus: Secondary | ICD-10-CM | POA: Diagnosis not present

## 2022-04-11 DIAGNOSIS — I700 Atherosclerosis of aorta: Secondary | ICD-10-CM | POA: Diagnosis present

## 2022-04-11 DIAGNOSIS — N186 End stage renal disease: Secondary | ICD-10-CM | POA: Diagnosis present

## 2022-04-11 DIAGNOSIS — Z79899 Other long term (current) drug therapy: Secondary | ICD-10-CM

## 2022-04-11 DIAGNOSIS — E875 Hyperkalemia: Secondary | ICD-10-CM | POA: Diagnosis not present

## 2022-04-11 DIAGNOSIS — I132 Hypertensive heart and chronic kidney disease with heart failure and with stage 5 chronic kidney disease, or end stage renal disease: Secondary | ICD-10-CM | POA: Diagnosis present

## 2022-04-11 DIAGNOSIS — I10 Essential (primary) hypertension: Secondary | ICD-10-CM | POA: Diagnosis present

## 2022-04-11 DIAGNOSIS — G9349 Other encephalopathy: Secondary | ICD-10-CM | POA: Diagnosis present

## 2022-04-11 DIAGNOSIS — N2581 Secondary hyperparathyroidism of renal origin: Secondary | ICD-10-CM | POA: Diagnosis present

## 2022-04-11 DIAGNOSIS — F17210 Nicotine dependence, cigarettes, uncomplicated: Secondary | ICD-10-CM | POA: Diagnosis present

## 2022-04-11 DIAGNOSIS — J449 Chronic obstructive pulmonary disease, unspecified: Secondary | ICD-10-CM | POA: Diagnosis present

## 2022-04-11 DIAGNOSIS — K219 Gastro-esophageal reflux disease without esophagitis: Secondary | ICD-10-CM | POA: Diagnosis present

## 2022-04-11 DIAGNOSIS — N19 Unspecified kidney failure: Secondary | ICD-10-CM | POA: Diagnosis present

## 2022-04-11 DIAGNOSIS — D631 Anemia in chronic kidney disease: Secondary | ICD-10-CM | POA: Diagnosis present

## 2022-04-11 DIAGNOSIS — F419 Anxiety disorder, unspecified: Secondary | ICD-10-CM | POA: Diagnosis present

## 2022-04-11 DIAGNOSIS — R401 Stupor: Secondary | ICD-10-CM

## 2022-04-11 LAB — COMPREHENSIVE METABOLIC PANEL
ALT: 16 U/L (ref 0–44)
AST: 22 U/L (ref 15–41)
Albumin: 4.2 g/dL (ref 3.5–5.0)
Alkaline Phosphatase: 203 U/L — ABNORMAL HIGH (ref 38–126)
Anion gap: 21 — ABNORMAL HIGH (ref 5–15)
BUN: 118 mg/dL — ABNORMAL HIGH (ref 6–20)
CO2: 18 mmol/L — ABNORMAL LOW (ref 22–32)
Calcium: 8.4 mg/dL — ABNORMAL LOW (ref 8.9–10.3)
Chloride: 95 mmol/L — ABNORMAL LOW (ref 98–111)
Creatinine, Ser: 16.08 mg/dL — ABNORMAL HIGH (ref 0.44–1.00)
GFR, Estimated: 2 mL/min — ABNORMAL LOW (ref >60)
Glucose, Bld: 84 mg/dL (ref 70–99)
Potassium: 6.4 mmol/L (ref 3.5–5.1)
Sodium: 134 mmol/L — ABNORMAL LOW (ref 135–145)
Total Bilirubin: 1.2 mg/dL (ref 0.3–1.2)
Total Protein: 8.6 g/dL — ABNORMAL HIGH (ref 6.5–8.1)

## 2022-04-11 LAB — CBC WITH DIFFERENTIAL/PLATELET
Abs Immature Granulocytes: 0.02 10*3/uL (ref 0.00–0.07)
Basophils Absolute: 0 10*3/uL (ref 0.0–0.1)
Basophils Relative: 1 %
Eosinophils Absolute: 0 10*3/uL (ref 0.0–0.5)
Eosinophils Relative: 0 %
HCT: 38.8 % (ref 36.0–46.0)
Hemoglobin: 12.5 g/dL (ref 12.0–15.0)
Immature Granulocytes: 1 %
Lymphocytes Relative: 15 %
Lymphs Abs: 0.6 10*3/uL — ABNORMAL LOW (ref 0.7–4.0)
MCH: 27.3 pg (ref 26.0–34.0)
MCHC: 32.2 g/dL (ref 30.0–36.0)
MCV: 84.7 fL (ref 80.0–100.0)
Monocytes Absolute: 0.3 10*3/uL (ref 0.1–1.0)
Monocytes Relative: 8 %
Neutro Abs: 3.1 10*3/uL (ref 1.7–7.7)
Neutrophils Relative %: 75 %
Platelets: 214 10*3/uL (ref 150–400)
RBC: 4.58 MIL/uL (ref 3.87–5.11)
RDW: 18.6 % — ABNORMAL HIGH (ref 11.5–15.5)
WBC: 4 10*3/uL (ref 4.0–10.5)
nRBC: 0 % (ref 0.0–0.2)

## 2022-04-11 LAB — TROPONIN I (HIGH SENSITIVITY): Troponin I (High Sensitivity): 8 ng/L (ref <18)

## 2022-04-11 LAB — CBG MONITORING, ED
Glucose-Capillary: 72 mg/dL (ref 70–99)
Glucose-Capillary: 66 mg/dL — ABNORMAL LOW (ref 70–99)

## 2022-04-11 MED ORDER — PANTOPRAZOLE SODIUM 40 MG PO TBEC
40.0000 mg | DELAYED_RELEASE_TABLET | Freq: Every day | ORAL | Status: DC
  Administered 2022-04-12 – 2022-04-13 (×2): 40 mg via ORAL
  Filled 2022-04-11 (×2): qty 1

## 2022-04-11 MED ORDER — LIDOCAINE-PRILOCAINE 2.5-2.5 % EX CREA: 1.0000 "application " | TOPICAL_CREAM | CUTANEOUS | Status: DC | PRN

## 2022-04-11 MED ORDER — LINACLOTIDE 72 MCG PO CAPS
72.0000 ug | ORAL_CAPSULE | Freq: Every day | ORAL | Status: DC | PRN
  Filled 2022-04-11: qty 1

## 2022-04-11 MED ORDER — SODIUM CHLORIDE 0.9 % IV SOLN: 100.0000 mL | INTRAVENOUS | Status: DC | PRN

## 2022-04-11 MED ORDER — ACETAMINOPHEN 500 MG PO TABS
1000.0000 mg | ORAL_TABLET | Freq: Three times a day (TID) | ORAL | Status: DC | PRN
  Administered 2022-04-12 – 2022-04-13 (×2): 1000 mg via ORAL
  Filled 2022-04-11: qty 2

## 2022-04-11 MED ORDER — HEPARIN SODIUM (PORCINE) 5000 UNIT/ML IJ SOLN
5000.0000 [IU] | Freq: Three times a day (TID) | INTRAMUSCULAR | Status: DC
  Administered 2022-04-12 – 2022-04-13 (×5): 5000 [IU] via SUBCUTANEOUS
  Filled 2022-04-11 (×6): qty 1

## 2022-04-11 MED ORDER — METOPROLOL TARTRATE 50 MG PO TABS
50.0000 mg | ORAL_TABLET | Freq: Two times a day (BID) | ORAL | Status: DC
  Administered 2022-04-12 (×3): 50 mg via ORAL
  Filled 2022-04-11 (×3): qty 1

## 2022-04-11 MED ORDER — QUETIAPINE FUMARATE 25 MG PO TABS: 25.0000 mg | ORAL_TABLET | Freq: Every day | ORAL | Status: DC

## 2022-04-11 MED ORDER — SODIUM CHLORIDE 0.9% FLUSH: 3.0000 mL | INTRAVENOUS | Status: DC | PRN

## 2022-04-11 MED ORDER — MOMETASONE FURO-FORMOTEROL FUM 200-5 MCG/ACT IN AERO
2.0000 | INHALATION_SPRAY | Freq: Two times a day (BID) | RESPIRATORY_TRACT | Status: DC
  Administered 2022-04-12 – 2022-04-13 (×2): 2 via RESPIRATORY_TRACT
  Filled 2022-04-11: qty 8.8

## 2022-04-11 MED ORDER — IOHEXOL 300 MG/ML  SOLN: 100.0000 mL | Freq: Once | INTRAMUSCULAR | Status: DC | PRN

## 2022-04-11 MED ORDER — QUETIAPINE FUMARATE 25 MG PO TABS: 100.0000 mg | ORAL_TABLET | Freq: Every day | ORAL | Status: DC

## 2022-04-11 MED ORDER — ALBUTEROL SULFATE (2.5 MG/3ML) 0.083% IN NEBU: 3.0000 mL | INHALATION_SOLUTION | Freq: Four times a day (QID) | RESPIRATORY_TRACT | Status: DC | PRN

## 2022-04-11 MED ORDER — HEPARIN SODIUM (PORCINE) 1000 UNIT/ML IJ SOLN
1000.0000 [IU] | INTRAMUSCULAR | Status: DC | PRN
  Filled 2022-04-11: qty 1

## 2022-04-11 MED ORDER — QUETIAPINE FUMARATE 25 MG PO TABS
125.0000 mg | ORAL_TABLET | Freq: Every day | ORAL | Status: DC
  Administered 2022-04-12 (×2): 125 mg via ORAL
  Filled 2022-04-11 (×2): qty 5

## 2022-04-11 MED ORDER — CALCIUM GLUCONATE-NACL 1-0.675 GM/50ML-% IV SOLN
1.0000 g | Freq: Once | INTRAVENOUS | Status: AC
Start: 2022-04-11 — End: 2022-04-11
  Administered 2022-04-11: 1000 mg via INTRAVENOUS
  Filled 2022-04-11: qty 50

## 2022-04-11 MED ORDER — DEXTROSE 50 % IV SOLN
25.0000 mL | Freq: Once | INTRAVENOUS | Status: AC
  Administered 2022-04-11: 25 mL via INTRAVENOUS
  Filled 2022-04-11: qty 50

## 2022-04-11 MED ORDER — CHLORHEXIDINE GLUCONATE CLOTH 2 % EX PADS
6.0000 | MEDICATED_PAD | Freq: Every day | CUTANEOUS | Status: DC
  Administered 2022-04-12 – 2022-04-13 (×2): 6 via TOPICAL
  Filled 2022-04-11: qty 6

## 2022-04-11 MED ORDER — SODIUM CHLORIDE 0.9 % IV SOLN: 250.0000 mL | INTRAVENOUS | Status: DC | PRN

## 2022-04-11 MED ORDER — HYDROXYZINE HCL 25 MG PO TABS
25.0000 mg | ORAL_TABLET | Freq: Every morning | ORAL | Status: DC
  Administered 2022-04-12 – 2022-04-13 (×2): 25 mg via ORAL
  Filled 2022-04-11 (×2): qty 1

## 2022-04-11 MED ORDER — AMLODIPINE BESYLATE 10 MG PO TABS
10.0000 mg | ORAL_TABLET | Freq: Every day | ORAL | Status: DC
  Administered 2022-04-12 – 2022-04-13 (×2): 10 mg via ORAL
  Filled 2022-04-11 (×2): qty 1

## 2022-04-11 MED ORDER — INSULIN ASPART 100 UNIT/ML IJ SOLN
5.0000 [IU] | Freq: Once | INTRAMUSCULAR | Status: AC
Start: 2022-04-11 — End: 2022-04-11
  Administered 2022-04-11: 5 [IU] via INTRAVENOUS
  Filled 2022-04-11: qty 1

## 2022-04-11 MED ORDER — DEXTROSE 50 % IV SOLN
1.0000 | Freq: Once | INTRAVENOUS | Status: AC
  Administered 2022-04-11: 50 mL via INTRAVENOUS
  Filled 2022-04-11: qty 50

## 2022-04-11 MED ORDER — PENTAFLUOROPROP-TETRAFLUOROETH EX AERO: 1.0000 "application " | INHALATION_SPRAY | CUTANEOUS | Status: DC | PRN

## 2022-04-11 MED ORDER — LIDOCAINE HCL (PF) 1 % IJ SOLN: 5.0000 mL | INTRAMUSCULAR | Status: DC | PRN

## 2022-04-11 MED ORDER — DULOXETINE HCL 30 MG PO CPEP
60.0000 mg | ORAL_CAPSULE | Freq: Two times a day (BID) | ORAL | Status: DC
  Administered 2022-04-12 (×2): 60 mg via ORAL
  Filled 2022-04-11 (×2): qty 2

## 2022-04-11 MED ORDER — SODIUM CHLORIDE 0.9% FLUSH
3.0000 mL | Freq: Two times a day (BID) | INTRAVENOUS | Status: DC
  Administered 2022-04-12 – 2022-04-13 (×4): 3 mL via INTRAVENOUS

## 2022-04-11 MED ORDER — ALTEPLASE 2 MG IJ SOLR
2.0000 mg | Freq: Once | INTRAMUSCULAR | Status: DC | PRN
  Filled 2022-04-11: qty 2

## 2022-04-11 NOTE — Assessment & Plan Note (Addendum)
Likely related to the missed hemodialysis.  ? EKG no acute changes. ?Patient underwent emergent hemodialysis. ?Potassium improved.  Continue to monitor. ? ? ?

## 2022-04-11 NOTE — Assessment & Plan Note (Addendum)
Hold blood pressure medication since having soft BP. ?

## 2022-04-11 NOTE — Assessment & Plan Note (Addendum)
BNP found to be 2038.6, consistent with fluid overload. ?She seems much improved after having dialysis. ?Continue hemodialysis as per schedule. ?Continue daily weight, intake output charting. ?

## 2022-04-11 NOTE — ED Triage Notes (Signed)
Per medic, family reports pt has a hx of depression and will stop going to dialysis/taking meds when depressed. Pt has Rx for sleep meds, medic reports family states pt has never abused medications.  ?

## 2022-04-11 NOTE — ED Notes (Signed)
Previous nurse reports that per dialysis staff-unable to take pt to dialysis at this time because she is unable to consent. States unable to reach any family members for consent.  ?

## 2022-04-11 NOTE — Assessment & Plan Note (Addendum)
Patient remains on multiple medication.  Cannot assess adherence. ?Patient seems depressed,  has been missing hemodialysis. ?Resume home antidepressant medications. ?

## 2022-04-11 NOTE — Assessment & Plan Note (Addendum)
Appears compensated.  Continue home inhalers. ?

## 2022-04-11 NOTE — H&P (Signed)
?History and Physical  ? ? ?Janet Olson PJA:[id number] DOB: 04-25-1970 DOA: 04/11/2022 ? ?DOS: the patient was seen and examined on 04/11/2022 ? ?PCP: Remi Haggard, FNP  ? ?Patient coming from: Home ? ?I have personally briefly reviewed patient's old medical records in [place] ? ?Ms Eiland, a 52 y/o with a history of ESRD on dialysis, anemia, who presents to the emergency department today because of concern for altered mental status. Unfortunately the patient herself cannot give any significant history. Per EMS report the patient had not seemed right to family most of the day, then this afternoon family heard her go to the bathroom and then heard a thud. When they checked on her they found her sitting in the bathroom. The patient does have ESRD and apparently has not been to dialysis for a week. Per EMS this is because the patient has some history of depression and will occasionally not go to dialysis.  ?   ? ?ED Course: Patient remains minimally responsive. Vitals stable. Lab notaqble for Na 134, K 6.4  BUN 118, Cr 16. Troponin 8. Patient with acute renal failure and uremic. Nephrology on board and HD orders written. Patient being transported to HD. TRH asked to admit for continued management.  ? ?Review of Systems:  ?Review of Systems  ?Unable to perform ROS: Mental status change  ? ?Past Medical History:  ?Diagnosis Date  ? (HFpEF) heart failure with preserved ejection fraction ([place])   ? a. 2015 Echo: Nl EF. Mild to mod LVH. Mild PR/MR; b. 04/2017 Echo: EF 55-60%, no rwma, Gr1 DD. Mildly dil LA. Small effusion; c. 12/2018 Echo: EF 55-60%, mildly dil LA/PA. Mod dil RA. Mild to mod RV dil. Mildly reduced RV fxn. Mild TR. Mod elev PASP.  ? Abdominal ascites   ? Acute hypoxemic respiratory failure ([place]) 01/03/2019  ? Acute renal failure (ARF) ([place]) 05/17/2017  ? Acute respiratory failure ([place]) 09/25/2018  ? Anemia   ? Anxiety   ? Arthritis   ? Asthma   ? Cardiomegaly   ? CHF (congestive heart failure)  ([place])   ? COPD (chronic obstructive pulmonary disease) ([place])   ? Depression   ? Dialysis patient [facility])   ? MON, WED , FRI  ? Dyspnea   ? ESRD (end stage renal disease) ([place])   ? GERD (gastroesophageal reflux disease)   ? Headache   ? Migraines  ? Hypertension   ? Nonrheumatic tricuspid (valve) insufficiency   ? Respiratory failure with hypoxia ([place]) 04/21/2018  ? Ulcer   ? gastric  ? Vitamin D deficiency   ? ? ?Past Surgical History:  ?Procedure Laterality Date  ? A/V FISTULAGRAM Left 04/17/2018  ? Procedure: A/V FISTULAGRAM;  Surgeon: Algernon Huxley, MD;  Location: [place] [facility];  Service: Cardiovascular;  Laterality: Left;  ? A/V FISTULAGRAM Left 05/27/2018  ? Procedure: A/V FISTULAGRAM;  Surgeon: Katha Cabal, MD;  Location: [place] [facility];  Service: Cardiovascular;  Laterality: Left;  ? A/V FISTULAGRAM Left 07/05/2020  ? Procedure: A/V FISTULAGRAM;  Surgeon: Katha Cabal, MD;  Location: [place] [facility];  Service: Cardiovascular;  Laterality: Left;  ? AV FISTULA PLACEMENT Left 08/08/2017  ? Procedure: ARTERIOVENOUS (AV) FISTULA CREATION ( BRACHIOCEPHALIC );  Surgeon: Algernon Huxley, MD;  Location: [facility];  Service: Vascular;  Laterality: Left;  ? AV FISTULA PLACEMENT Left 08/05/2020  ? Procedure: INSERTION OF ARTERIOVENOUS (AV) GRAFT ARM(BRACHIAL AXILLARY);  Surgeon: Katha Cabal, MD;  Location:  [facility];  Service: Vascular;  Laterality: Left;  ? CESAREAN SECTION    ? x 4 (1988, 1991, 1997, 2000 )  ? COLONOSCOPY WITH PROPOFOL N/A 04/24/2018  ? Procedure: COLONOSCOPY WITH PROPOFOL;  Surgeon: Lin Landsman, MD;  Location: [facility];  Service: Gastroenterology;  Laterality: N/A;  ? DIALYSIS/PERMA CATHETER INSERTION N/A 07/16/2017  ? Procedure: Dialysis/Perma Catheter Insertion;  Surgeon: Katha Cabal, MD;  Location: [place] [facility];  Service: Cardiovascular;  Laterality: N/A;  ? DIALYSIS/PERMA CATHETER REMOVAL N/A 10/29/2017  ? Procedure: DIALYSIS/PERMA CATHETER REMOVAL;   Surgeon: Katha Cabal, MD;  Location: [place] [facility];  Service: Cardiovascular;  Laterality: N/A;  ? DIALYSIS/PERMA CATHETER REMOVAL N/A 11/03/2020  ? Procedure: DIALYSIS/PERMA CATHETER REMOVAL;  Surgeon: Algernon Huxley, MD;  Location: [place] [facility];  Service: Cardiovascular;  Laterality: N/A;  ? excision of lymph nodes Bilateral 2014  ? bilateral under arms.  ? PARATHYROID IMPLANT REMOVAL    ? REMOVAL OF A DIALYSIS CATHETER Left 08/05/2020  ? Procedure: REMOVAL OF A DIALYSIS CATHETER ( FISTULA );  Surgeon: Katha Cabal, MD;  Location: [facility];  Service: Vascular;  Laterality: Left;  ? SHOULDER ARTHROSCOPY WITH SUBACROMIAL DECOMPRESSION AND OPEN ROTATOR C Right 01/08/2022  ? Procedure: Right shoulder arthroscopic subscapularis repair, rotator cuff repair, subacromial decompression, and distal clavicle excision;  Surgeon: Leim Fabry, MD;  Location: [facility] ORS;  Service: Orthopedics;  Laterality: Right;  ? ? ? reports that she has been smoking cigarettes. She has a 15.00 pack-year smoking history. She has never used smokeless tobacco. She reports that she does not drink alcohol and does not use drugs. ? ?No Known Allergies ? ?Family History  ?Problem Relation Age of Onset  ? Diabetes Mother   ? ? ?Prior to Admission medications   ?Medication Sig Start Date End Date Taking? Authorizing Provider  ?amLODipine (NORVASC) 10 MG tablet Take 1 tablet (10 mg total) by mouth daily. 08/01/21  Yes Minna Merritts, MD  ?Ruthe Mannan 200-5 MCG/ACT AERO Inhale 2 puffs into the lungs 2 (two) times daily. 01/16/18  Yes [provider]  ?DULoxetine (CYMBALTA) 60 MG capsule Take 60 mg by mouth 2 (two) times daily. 05/14/21  Yes [provider]  ?hydrOXYzine (ATARAX/VISTARIL) 25 MG tablet Take 25 mg by mouth in the morning. 12/21/19  Yes [provider]  ?lidocaine-prilocaine (EMLA) cream 1 application as needed (prior to port being accessed). 12/15/20  Yes [provider]  ?metoprolol  tartrate (LOPRESSOR) 50 MG tablet Take 1 tablet (50 mg total) by mouth 2 (two) times daily. 08/01/21  Yes Minna Merritts, MD  ?multivitamin (RENA-VIT) TABS tablet Take 1 tablet by mouth daily.  08/23/17  Yes [provider]  ?pantoprazole (PROTONIX) 40 MG tablet Take 1 tablet (40 mg total) by mouth daily. 12/20/18  Yes Schuyler Amor, MD  ?QUEtiapine (SEROQUEL) 100 MG tablet Take 100 mg by mouth at bedtime. 11/24/21  Yes [provider]  ?QUEtiapine (SEROQUEL) 25 MG tablet Take 25 mg by mouth at bedtime. 05/24/20  Yes [provider]  ?torsemide (DEMADEX) 100 MG tablet Take 1 tablet (100 mg total) by mouth daily. 09/27/18  Yes Loletha Grayer, MD  ?Vitamin D, Ergocalciferol, (DRISDOL) 50000 units CAPS capsule Take 50,000 Units by mouth every Friday. 10/22/17  Yes [provider]  ?acetaminophen (TYLENOL) 500 MG tablet Take 2 tablets (1,000 mg total) by mouth every 8 (eight) hours. 01/08/22 01/08/23  Leim Fabry, MD  ?albuterol (PROVENTIL) (2.5 MG/3ML)  0.083% nebulizer solution Take 2.5 mg by nebulization every 6 (six) hours as needed for wheezing or shortness of breath.     [provider]  ?albuterol (VENTOLIN HFA) 108 (90 Base) MCG/ACT inhaler Inhale 2 puffs into the lungs every 6 (six) hours as needed for wheezing or shortness of breath.  12/22/19   [provider]  ?AURYXIA 1 GM 210 MG(Fe) tablet Take 210 mg by mouth 2 (two) times daily with a meal. ?Patient not taking: Reported on 04/11/2022 10/05/21   [provider]  ?benzonatate (TESSALON) 200 MG capsule Take 200 mg by mouth 2 (two) times daily as needed for cough. ?Patient not taking: Reported on 02/13/2022    [provider]  ?chlorpheniramine-HYDROcodone (TUSSIONEX) 10-8 MG/5ML SUER Take 5 mLs by mouth daily as needed for cough.  ?Patient not taking: Reported on 02/13/2022    [provider]  ?docusate sodium (COLACE) 100 MG capsule Take 100 mg by mouth daily as needed for moderate  constipation.  ?Patient not taking: Reported on 02/13/2022    [provider]  ?linaclotide (LINZESS) 72 MCG capsule Take 72 mcg by mouth daily as needed (constipation.).    Provider, Historical

## 2022-04-11 NOTE — ED Provider Notes (Signed)
? ?[facility] ?Provider Note ? ? ? Event Date/Time  ? First MD Initiated Contact with Patient 04/11/22 1552   ?  (approximate) ? ? ?History  ? ?Altered Mental Status ? ? ?HPI ? ?Janet Olson is a 52 y.o. female  who, per oncology note dated 03/02/22 has history of ESRD on dialysis, anemia, who presents to the emergency department today because of concern for altered mental status. Unfortunately the patient herself cannot give any significant history. Per EMS report the patient had not seemed right to family most of the day, then this afternoon family heard her go to the bathroom and then heard a thud. When they checked on her they found her sitting in the bathroom. The patient does have ESRD and apparently has not been to dialysis for a week. Per EMS this is because the patient has some history of depression and will occasionally not go to dialysis.  ? ?Physical Exam  ? ?Triage Vital Signs: ?ED Triage Vitals  ?Enc Vitals Group  ?   BP 04/11/22 1553 126/88  ?   Pulse Rate 04/11/22 1553 88  ?   Resp 04/11/22 1553 20  ?   Temp 04/11/22 1553 (!) 96.6 ?F (35.9 ?C)  ?   Temp Source 04/11/22 1553 Axillary  ?   SpO2 04/11/22 1553 99 %  ?   Weight 04/11/22 1554 204 lb 5.9 oz (92.7 kg)  ?   Height 04/11/22 1554 '5\' 6"'$  (1.676 m)  ? ?Most recent vital signs: ?Vitals:  ? 04/11/22 1553  ?BP: 126/88  ?Pulse: 88  ?Resp: 20  ?Temp: (!) 96.6 ?F (35.9 ?C)  ?SpO2: 99%  ? ?General: Awake, responsive to verbal stimuli. Follows basic commands.  ?CV:  Good peripheral perfusion. Regular rate and rhythm. ?Resp:  Normal effort. Lungs clear to ausculation. ?Abd:  No distention.  ? ? ? ?ED Results / Procedures / Treatments  ? ?Labs ?(all labs ordered are listed, but only abnormal results are displayed) ?Labs Reviewed  ?CBC WITH DIFFERENTIAL/PLATELET - Abnormal; Notable for the following components:  ?    Result Value  ? RDW 18.6 (*)   ? Lymphs Abs 0.6 (*)   ? All other components within normal limits  ?COMPREHENSIVE  METABOLIC PANEL - Abnormal; Notable for the following components:  ? Sodium 134 (*)   ? Potassium 6.4 (*)   ? Chloride 95 (*)   ? CO2 18 (*)   ? BUN 118 (*)   ? Creatinine, Ser 16.08 (*)   ? Calcium 8.4 (*)   ? Total Protein 8.6 (*)   ? Alkaline Phosphatase 203 (*)   ? GFR, Estimated 2 (*)   ? Anion gap 21 (*)   ? All other components within normal limits  ?URINALYSIS, ROUTINE W REFLEX MICROSCOPIC  ?CBG MONITORING, ED  ?TYPE AND SCREEN  ?TROPONIN I (HIGH SENSITIVITY)  ? ? ? ?EKG ? ?INance Pear, attending physician, personally viewed and interpreted this EKG ? ?EKG Time: 1554 ?Rate: 94 ?Rhythm: atrial fibrillation/flutter ?Axis: right axis deviation ?Intervals: qtc 541 ?QRS: RBBB, LPFB ?ST changes: no st elevation ?Impression: abnormal ekg ? ? ?RADIOLOGY ?I independently interpreted and visualized the CT head. My interpretation: No bleed. No large mass. ?Radiology interpretation:  ?IMPRESSION: ?No acute intracranial abnormality. ? ? ? ?I independently interpreted and visualized the CXR. My interpretation: cardiomegaly.  ?Radiology interpretation:  ?  ?IMPRESSION:  ?1. Enlarged cardiac silhouette with underlying pericardial effusion  ?not excluded. Recommend repeat PA and lateral  view of the chest for  ?further evaluation of the cardiac silhouette.  ?2. Aortic Atherosclerosis (ICD10-I70.0) and Emphysema (ICD10-J43.9).  ? ? ?PROCEDURES: ? ?Critical Care performed: Yes ? ?CRITICAL CARE ?Performed by: Nance Pear ? ? ?Total critical care time: 30 minutes ? ?Critical care time was exclusive of separately billable procedures and treating other patients. ? ?Critical care was necessary to treat or prevent imminent or life-threatening deterioration. ? ?Critical care was time spent personally by me on the following activities: development of treatment plan with patient and/or surrogate as well as nursing, discussions with consultants, evaluation of patient's response to treatment, examination of patient, obtaining  history from patient or surrogate, ordering and performing treatments and interventions, ordering and review of laboratory studies, ordering and review of radiographic studies, pulse oximetry and re-evaluation of patient's condition. ? ? ?Procedures ? ? ?MEDICATIONS ORDERED IN ED: ?Medications - No data to display ? ? ?IMPRESSION / MDM / ASSESSMENT AND PLAN / ED COURSE  ?I reviewed the triage vital signs and the nursing notes. ?             ?               ? ?Differential diagnosis includes, but is not limited to, infection, anemia, uremia, intracranial process. ? ?The patient presented to the emergency department today because of concerns for altered mental status.  Apparently the patient has not had dialysis for roughly 1 week.  Patient has history of anemia and end-stage renal disease.  Broad work-up was initiated to evaluate for possible causes of altered mental status.  Blood work without any concerning anemia.  Did show elevated potassium which is consistent with patient's reported noncompliance with dialysis.  Additionally blood work was notable for significant elevation of the BUN and creatinine.  I do think patient likely uremic.  No white count to suggest infection at this time.  Head CT without intracranial process.  Discussed with Dr. Holley Raring who will plan on helping arrange dialysis.  Discussed with Dr. Linda Hedges with the hospitalist service who will plan on admission. ? ?FINAL CLINICAL IMPRESSION(S) / ED DIAGNOSES  ? ?Final diagnoses:  ?Altered mental status, unspecified altered mental status type  ?Uremia  ? ? ?Note:  This document was prepared using Dragon voice recognition software and may include unintentional dictation errors. ? ?  ?Nance Pear, MD ?04/11/22 1916 ? ?

## 2022-04-11 NOTE — Assessment & Plan Note (Addendum)
Patient missed hemodialysis, presented with uremic encephalopathy /altered mentation.  ?Patient underwent emergent hemodialysis 04/11/22 ?Continue hemodialysis as per schedule on Monday. ?

## 2022-04-11 NOTE — ED Triage Notes (Signed)
Pt from home via [facility], found leaning against wall of tub with eyes closed, lethargic and "borderline unresponsive", family reports they saw pt step in, shortly after heard a thud and found her. Last dialysis 04/04/22. Responds to verbal stimuli. LKW lastnight at 1800. Per family pt has been "out of it" all day today.  ? ?Initial ?70s% on RA ?93/45 ?93% on NRB at 15L ? ?20RAC 250 NS ?BGL 126 ?T98.2 ?130/69 ?16RR  ?83 HR ?ETCO2 34 ? ?

## 2022-04-11 NOTE — ED Notes (Signed)
BGL 72 ?

## 2022-04-11 NOTE — Assessment & Plan Note (Addendum)
Baseline mentation unknown but evidently has been functioning at home until yesterday. Mental status back to baseline after having emergent dialysis. ?

## 2022-04-11 NOTE — Subjective & Objective (Signed)
Ms Woodle, a 52 y/o with a history of ESRD on dialysis, anemia, who presents to the emergency department today because of concern for altered mental status. Unfortunately the patient herself cannot give any significant history. Per EMS report the patient had not seemed right to family most of the day, then this afternoon family heard her go to the bathroom and then heard a thud. When they checked on her they found her sitting in the bathroom. The patient does have ESRD and apparently has not been to dialysis for a week. Per EMS this is because the patient has some history of depression and will occasionally not go to dialysis.  ?  ?

## 2022-04-11 NOTE — ED Notes (Signed)
MD Archie Balboa notified of K of 6.4 on this pt at this time.  ?

## 2022-04-11 NOTE — ED Notes (Addendum)
Secretary to page HD nurse to discuss consent for dialysis.  ?

## 2022-04-12 ENCOUNTER — Other Ambulatory Visit: Payer: Self-pay

## 2022-04-12 DIAGNOSIS — N19 Unspecified kidney failure: Secondary | ICD-10-CM

## 2022-04-12 LAB — RENAL FUNCTION PANEL
Albumin: 3.9 g/dL (ref 3.5–5.0)
Albumin: 3.2 g/dL — ABNORMAL LOW (ref 3.5–5.0)
Anion gap: 18 — ABNORMAL HIGH (ref 5–15)
Anion gap: 14 (ref 5–15)
BUN: 91 mg/dL — ABNORMAL HIGH (ref 6–20)
BUN: 90 mg/dL — ABNORMAL HIGH (ref 6–20)
CO2: 22 mmol/L (ref 22–32)
CO2: 21 mmol/L — ABNORMAL LOW (ref 22–32)
Calcium: 8.9 mg/dL (ref 8.9–10.3)
Calcium: 8.1 mg/dL — ABNORMAL LOW (ref 8.9–10.3)
Chloride: 96 mmol/L — ABNORMAL LOW (ref 98–111)
Chloride: 100 mmol/L (ref 98–111)
Creatinine, Ser: 13.04 mg/dL — ABNORMAL HIGH (ref 0.44–1.00)
Creatinine, Ser: 13.46 mg/dL — ABNORMAL HIGH (ref 0.44–1.00)
GFR, Estimated: 3 mL/min — ABNORMAL LOW (ref >60)
GFR, Estimated: 3 mL/min — ABNORMAL LOW (ref >60)
Glucose, Bld: 106 mg/dL — ABNORMAL HIGH (ref 70–99)
Glucose, Bld: 100 mg/dL — ABNORMAL HIGH (ref 70–99)
Phosphorus: 5.5 mg/dL — ABNORMAL HIGH (ref 2.5–4.6)
Phosphorus: 5.7 mg/dL — ABNORMAL HIGH (ref 2.5–4.6)
Potassium: 4.9 mmol/L (ref 3.5–5.1)
Potassium: 4.5 mmol/L (ref 3.5–5.1)
Sodium: 136 mmol/L (ref 135–145)
Sodium: 135 mmol/L (ref 135–145)

## 2022-04-12 LAB — CBC
HCT: 41 % (ref 36.0–46.0)
HCT: 36.2 % (ref 36.0–46.0)
Hemoglobin: 13.5 g/dL (ref 12.0–15.0)
Hemoglobin: 11.9 g/dL — ABNORMAL LOW (ref 12.0–15.0)
MCH: 27.1 pg (ref 26.0–34.0)
MCH: 27.2 pg (ref 26.0–34.0)
MCHC: 32.9 g/dL (ref 30.0–36.0)
MCHC: 32.9 g/dL (ref 30.0–36.0)
MCV: 82.3 fL (ref 80.0–100.0)
MCV: 82.8 fL (ref 80.0–100.0)
Platelets: 170 10*3/uL (ref 150–400)
Platelets: 137 10*3/uL — ABNORMAL LOW (ref 150–400)
RBC: 4.98 MIL/uL (ref 3.87–5.11)
RBC: 4.37 MIL/uL (ref 3.87–5.11)
RDW: 17.8 % — ABNORMAL HIGH (ref 11.5–15.5)
RDW: 17.4 % — ABNORMAL HIGH (ref 11.5–15.5)
WBC: 4 10*3/uL (ref 4.0–10.5)
WBC: 3.2 10*3/uL — ABNORMAL LOW (ref 4.0–10.5)
nRBC: 0 % (ref 0.0–0.2)
nRBC: 0 % (ref 0.0–0.2)

## 2022-04-12 LAB — TYPE AND SCREEN
ABO/RH(D): O POS
Antibody Screen: NEGATIVE

## 2022-04-12 LAB — HEPATITIS B SURFACE ANTIBODY,QUALITATIVE: Hep B S Ab: REACTIVE — AB

## 2022-04-12 LAB — HEPATITIS B SURFACE ANTIGEN: Hepatitis B Surface Ag: NONREACTIVE

## 2022-04-12 LAB — BRAIN NATRIURETIC PEPTIDE: B Natriuretic Peptide: 2038.6 pg/mL — ABNORMAL HIGH (ref 0.0–100.0)

## 2022-04-12 LAB — HIV ANTIBODY (ROUTINE TESTING W REFLEX): HIV Screen 4th Generation wRfx: NONREACTIVE

## 2022-04-12 MED ORDER — OXYCODONE HCL 5 MG PO TABS
5.0000 mg | ORAL_TABLET | Freq: Once | ORAL | Status: AC
  Administered 2022-04-12: 5 mg via ORAL
  Filled 2022-04-12: qty 1

## 2022-04-12 MED ORDER — CALCIUM ACETATE (PHOS BINDER) 667 MG PO CAPS
1334.0000 mg | ORAL_CAPSULE | Freq: Three times a day (TID) | ORAL | Status: DC
  Administered 2022-04-12: 1334 mg via ORAL
  Filled 2022-04-12 (×3): qty 2

## 2022-04-12 MED ORDER — CINACALCET HCL 30 MG PO TABS
30.0000 mg | ORAL_TABLET | ORAL | Status: DC
  Administered 2022-04-13: 30 mg via ORAL
  Filled 2022-04-12: qty 1

## 2022-04-12 MED ORDER — ALUM & MAG HYDROXIDE-SIMETH 200-200-20 MG/5ML PO SUSP
30.0000 mL | Freq: Four times a day (QID) | ORAL | Status: DC | PRN
  Administered 2022-04-12: 30 mL via ORAL
  Filled 2022-04-12: qty 30

## 2022-04-12 MED ORDER — DULOXETINE HCL 30 MG PO CPEP
30.0000 mg | ORAL_CAPSULE | Freq: Every day | ORAL | Status: DC
  Administered 2022-04-13: 30 mg via ORAL
  Filled 2022-04-12 (×2): qty 1

## 2022-04-12 NOTE — Progress Notes (Signed)
Hemodialysis Post Treatment Note ? ?11 April 2022  ? ?Access: Left Upper Arm AVF  ? ?UF Removed: 972 ml ? ?Next Scheduled Treatment: 04/12/22  ? ?Note:  ?Patient with unscheduled treatment, presents with AMS, responds to voice and verbal commands. VSS, RA, AVF cannulated with difficulty having a blood clot at arterial site requiring a second stick. BFR maintained throughout treatment, targeted UF achieved. Patient's mentation drastically improved with treatment, as she completed, she was AxOx3, engaging in conversation. Patient states she has been depressed therefore, has not felt up to attending dialysis sessions. AVF with excessive bleeding post treatment requiring additional holding time. Patient received a bed assignment, transported to room 228, report given to nurse assigned. Note ordered dialysis drawn and sent.  ?

## 2022-04-12 NOTE — Progress Notes (Signed)
Hemodialysis patient known at [facility] MWF 11am, patient stated no dialysis concerns.  ?

## 2022-04-12 NOTE — Progress Notes (Signed)
?Progress Note ? ? ?Patient: Janet Olson SEG:[id number] DOB: Jan 29, 1970 DOA: 04/11/2022     1 ? ?DOS: the patient was seen and examined on 04/12/2022 ?  ?Brief hospital course: ?This 52 years old female with PMH significant for ESRD on hemodialysis, anemia of chronic disease presented in ED  with concerns about altered mental status.  History is obtained from ED chart and EMS report.  Family reports patient was not been right most of the day and afternoon family noted her falling in the bathroom,  Patient denies any head injury.  Patient seems to be having depression and apparently patient has not had dialysis for a week.  In the ED patient was minimally responsive,  vitals were stable. Nephrology was consulted.  Patient underwent emergent hemodialysis. ?Patient was admitted for uremic encephalopathy secondary to missed hemodialysis. ? ?Assessment and Plan: ?Altered mental status ?Baseline mentation unknown but evidently has been functioning at home until yesterday. Mental status back to baseline after having emergent dialysis. ? ?Hyperkalemia ?Likely related to the missed hemodialysis.  EKG no acute changes. ?Patient underwent emergent hemodialysis. ?Potassium improved.  Continue to monitor. ? ? ? ?ESRD on dialysis [facility]) ?Patient missed hemodialysis, presented with uremic encephalopathy /altered mentation.  ?Patient underwent emergent hemodialysis 04/11/22 ?Continue hemodialysis as per schedule.  ? ?Depression ?Patient remains on multiple medication.  Cannot assess adherence. ?Patient seems depressed,  has been missing hemodialysis. ?Resume home antidepressant medications. ? ?COPD (chronic obstructive pulmonary disease) ([place]) ?Appears compensated.  Continue home inhalers. ? ?Benign essential HTN ?Hold blood pressure medication since having soft BP. ? ?Diastolic dysfunction with chronic heart failure ([place]) ?BNP found to be 2038.6, consistent with fluid overload. ?She seems much improved after having  dialysis. ?Continue hemodialysis as per schedule. ?Continue daily weight, intake output charting. ? ? ?Subjective: Patient was seen and examined at bedside.  Overnight events noted. ?Patient seems much improved.  Does appear she is back to her baseline mental status, She is fully aware.  She underwent emergent hemodialysis on 4/12.   ? ?Physical Exam: ?Vitals:  ? 04/12/22 1130 04/12/22 1145 04/12/22 1200 04/12/22 1215  ?BP: 116/75 130/79 128/81 125/84  ?Pulse: (!) 105 (!) 106 (!) 106 (!) 102  ?Resp: '20 17 15 20  '$ ?Temp:      ?TempSrc:      ?SpO2: 94% 95% 98%   ?Weight:      ?Height:      ? ?General exam: Appears comfortable, not in any acute distress.  Deconditioned ?Respiratory system: CTA bilaterally, no wheezing, no crackles, normal respiratory effort. ?Cardiovascular system: S1-S2 heard, regular rate and rhythm, no murmur. ?Gastrointestinal system: Abdomen is soft, nontender, nondistended, BS+ ?Central nervous system: Alert, oriented x 3, no focal neurological deficits. ?Extremities: No edema, no cyanosis, no clubbing. ?Psychiatry: Mood, insight, judgment normal. ? ? ?Data Reviewed: ?I have Reviewed nursing notes, Vitals, and Lab results since pt's last encounter. Pertinent lab results CBC, BMP ?I have ordered test including CBC, BMP ?I have reviewed the last note from nephrology,  ?I have discussed pt's care plan and test results with patient.  ? ?Family Communication: No family at bedside ? ?Disposition: ?Status is: Inpatient ?Remains inpatient appropriate because: Admitted for uremic encephalopathy/altered mental restriction secondary to missed hemodialysis.  Patient underwent emergent hemodialysis she is much improved.  Back to her baseline mental status. ? ?Anticipated discharge home in 04/13/2022 ? ? Planned Discharge Destination: Home ? ? ? ?Time spent: 50 minutes ? ?Author: ?Shawna Clamp, MD ?04/12/2022 12:49 PM ? ?For  on call review [email or web address].  ?

## 2022-04-12 NOTE — Progress Notes (Signed)
Hemodialysis Post Treatment Note: ? ?Tx date:04/12/2022 ?Tx time:2 hours and 30 minutes ?Access: Left AVF ?UF Removed: 1L ? ?Note: HD treatment completed, tolerated well. No complications. Pt asymptomatic ? ? ? ? ? ? ? ? ?  ?

## 2022-04-12 NOTE — Plan of Care (Signed)
  Problem: Health Behavior/Discharge Planning: Goal: Ability to manage health-related needs will improve Outcome: Progressing   

## 2022-04-12 NOTE — ED Notes (Signed)
Spoke with dialysis RN, advised her that pt is going to room 228 and report was given to [facility] from [place] ?

## 2022-04-12 NOTE — Hospital Course (Addendum)
This 52 years old female with PMH significant for ESRD on hemodialysis, anemia of chronic disease presented in ED  with concerns about altered mental status.  History is obtained from ED chart and EMS report.  Family reports patient was not been right most of the day and afternoon family noted her falling in the bathroom,  Patient denies any head injury. Patient seems to be having depression and apparently patient has not had dialysis for a week.  In the ED patient was minimally responsive,  vitals were stable. Nephrology was consulted.  Patient underwent emergent hemodialysis. ?Patient was admitted for uremic encephalopathy secondary to missed hemodialysis.  Patient underwent 3 sessions of dialysis, back to her baseline mental status.  All antidepressant medications continued.  Patient is cleared from nephrology to be discharged and continue dialysis as per schedule.  Importance of continuation of hemodialysis explained in detail,  she understood.  Patient is being discharged home ?

## 2022-04-12 NOTE — TOC Initial Note (Signed)
Transition of Care (TOC) - Initial/Assessment Note  ? ? ?Patient Details  ?Name: Janet Olson ?MRN: [id number] ?Date of Birth: 1970-02-03 ? ?Transition of Care (TOC) CM/SW Contact:    ?Candie Chroman, LCSW ?Phone Number: ?04/12/2022, 9:22 AM ? ?Clinical Narrative:   Readmission prevention screen complete. Unable to enter data into screening tool at this time. CSW met with patient. No supports at bedside. CSW introduced role and explained that discharge planning would be discussed. Patient lives home with her son. PCP is Threasa Alpha, FNP. Patient drives herself to appointments. Pharmacy is [facility] on [address]. No issues obtaining medications. No home health or DME use prior to admission. Family will pick her up at discharge. No further concerns. CSW encouraged patient to contact CSW as needed. CSW will continue to follow patient for support and facilitate return home when stable.             ? ?Expected Discharge Plan: Home/Self Care ?Barriers to Discharge: Continued Medical Work up ? ? ?Patient Goals and CMS Choice ?  ?  ?  ? ?Expected Discharge Plan and Services ?Expected Discharge Plan: Home/Self Care ?  ?  ?Post Acute Care Choice: NA ?Living arrangements for the past 2 months: [place] ?                ?  ?  ?  ?  ?  ?  ?  ?  ?  ?  ? ?Prior Living Arrangements/Services ?Living arrangements for the past 2 months: [place] ?Lives with:: Adult Children ?Patient language and need for interpreter reviewed:: Yes ?Do you feel safe going back to the place where you live?: Yes      ?Need for Family Participation in Patient Care: Yes (Comment) ?Care giver support system in place?: Yes (comment) ?  ?Criminal Activity/Legal Involvement Pertinent to Current Situation/Hospitalization: No - Comment as needed ? ?Activities of Daily Living ?Home Assistive Devices/Equipment: None ?ADL Screening (condition at time of admission) ?Patient's cognitive ability adequate to safely complete daily  activities?: Yes ?Is the patient deaf or have difficulty hearing?: No ?Does the patient have difficulty seeing, even when wearing glasses/contacts?: No ?Does the patient have difficulty concentrating, remembering, or making decisions?: No ?Patient able to express need for assistance with ADLs?: Yes ?Does the patient have difficulty dressing or bathing?: No ?Independently performs ADLs?: Yes (appropriate for developmental age) ?Does the patient have difficulty walking or climbing stairs?: Yes ?Weakness of Legs: Both ?Weakness of Arms/Hands: None ? ?Permission Sought/Granted ?  ?  ?   ?   ?   ?   ? ?Emotional Assessment ?Appearance:: Appears stated age ?Attitude/Demeanor/Rapport: Engaged ?Affect (typically observed): Accepting, Calm, Pleasant ?Orientation: : Oriented to Self, Oriented to Place, Oriented to  Time, Oriented to Situation ?Alcohol / Substance Use: Not Applicable ?Psych Involvement: No (comment) ? ?Admission diagnosis:  Uremia [N19] ?Acute uremia [N19] ?Altered mental status, unspecified altered mental status type [R41.82] ?Patient Active Problem List  ? Diagnosis Date Noted  ? Acute uremia 04/11/2022  ? Pain in joint, shoulder region 05/25/2021  ? Diarrhea 11/07/2020  ? Benign essential HTN 11/07/2020  ? Anxiety 11/07/2020  ? Depression 11/07/2020  ? GERD (gastroesophageal reflux disease) 11/07/2020  ? Infection of hemodialysis tunneled catheter ([place]) 11/07/2020  ? Weight loss 11/07/2020  ? Other ascites 11/07/2020  ? Ear mass, left 11/07/2020  ? Epigastric pain 11/07/2020  ? Enterococcus faecalis infection 11/03/2020  ? Sepsis ([place]) 11/03/2020  ? Right upper quadrant  abdominal pain 11/02/2020  ? Mass of ear canal, left 11/02/2020  ? Abdominal ascites   ? Acute on chronic diastolic CHF (congestive heart failure) ([place]) 03/03/2020  ? Anasarca associated with disorder of kidney 03/03/2020  ? Hyponatremia with excess extracellular fluid volume 03/03/2020  ? Hyperkalemia 03/03/2020  ? Diastolic dysfunction  with chronic heart failure ([place]) 04/28/2019  ? Nonrheumatic tricuspid valve regurgitation 04/28/2019  ? Tobacco use 04/28/2019  ? Pericardial effusion 01/26/2019  ? Shortness of breath 01/26/2019  ? Altered mental status 12/04/2018  ? Renal dialysis device, implant, or graft complication 19/59/7471  ? ESRD on dialysis ([place]) 08/02/2017  ? COPD (chronic obstructive pulmonary disease) ([place]) 06/20/2017  ? Anemia in chronic kidney disease 06/07/2017  ? 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 Mass of left axilla 12/07/2013  ? ?PCP:  Remi Haggard, FNP ?Pharmacy:   ?[facility] [place], [facility] ?[place] ?[place] [address] ?Phone: [phone] Fax: [phone] ? ?[facility] ?[address], [address] ?[place] [address] ?Phone: [phone] Fax: [phone] ? ? ? ? ?Social Determinants of Health (SDOH) Interventions ?  ? ?Readmission Risk Interventions ?   ? View : No data to display.  ?  ?  ?  ? ? ? ?

## 2022-04-12 NOTE — Progress Notes (Signed)
?[facility]  ?ROUNDING NOTE  ? ?Subjective:  ?Patient well-known to [place] as we follow her for outpatient hemodialysis treatment. ?She has missed several outpatient hemodialysis treatments. ?She came in with altered mental status. ?Patient more awake and alert today. ?States that she has been attending dialysis due to depression. ? ? ?Objective:  ?Vital signs in last 24 hours:  ?Temp:  [96.6 ?F (35.9 ?C)-98.4 ?F (36.9 ?C)] 98.4 ?F (36.9 ?C) (04/13 1052) ?Pulse Rate:  [77-106] 102 (04/13 1215) ?Resp:  [12-22] 20 (04/13 1215) ?BP: (98-149)/(66-93) 125/84 (04/13 1215) ?SpO2:  [93 %-100 %] 98 % (04/13 1200) ?Weight:  [86.9 kg-92.7 kg] 86.9 kg (04/13 1052) ? ?Weight change:  ?Filed Weights  ? 04/11/22 1554 04/12/22 1052  ?Weight: 92.7 kg 86.9 kg  ? ? ?Intake/Output: ?I/O last 3 completed shifts: ?In: -  ?Out: 972 [Other:972] ?  ?Intake/Output this shift: ? No intake/output data recorded. ? ?Physical Exam: ?General: No acute distress  ?Head: Normocephalic, atraumatic. Moist oral mucosal membranes  ?Eyes: Anicteric  ?Neck: Supple  ?Lungs:  Clear to auscultation, normal effort  ?Heart: S1S2 no rubs  ?Abdomen:  Soft, nontender, bowel sounds present  ?Extremities: 1+ peripheral edema.  ?Neurologic: Awake, alert, following commands  ?Skin: No acute rash  ?Access: Left upper extremity AV fistula  ? ? ?Basic Metabolic Panel: ?Recent Labs  ?Lab 04/11/22 ?1559 04/12/22 ?1884 04/12/22 ?0930  ?NA 134* 136 135  ?K 6.4* 4.9 4.5  ?CL 95* 96* 100  ?CO2 18* 22 21*  ?GLUCOSE 84 106* 100*  ?BUN 118* 91* 90*  ?CREATININE 16.08* 13.04* 13.46*  ?CALCIUM 8.4* 8.9 8.1*  ?PHOS  --  5.5* 5.7*  ? ? ?Liver Function Tests: ?Recent Labs  ?Lab 04/11/22 ?1559 04/12/22 ?1660 04/12/22 ?0930  ?AST 22  --   --   ?ALT 16  --   --   ?ALKPHOS 203*  --   --   ?BILITOT 1.2  --   --   ?PROT 8.6*  --   --   ?ALBUMIN 4.2 3.9 3.2*  ? ?No results for input(s): LIPASE, AMYLASE in the last 168 hours. ?No results for input(s): AMMONIA in the last 168  hours. ? ?CBC: ?Recent Labs  ?Lab 04/11/22 ?1559 04/12/22 ?6301 04/12/22 ?1120  ?WBC 4.0 4.0 3.2*  ?NEUTROABS 3.1  --   --   ?HGB 12.5 13.5 11.9*  ?HCT 38.8 41.0 36.2  ?MCV 84.7 82.3 82.8  ?PLT 214 170 137*  ? ? ?Cardiac Enzymes: ?No results for input(s): CKTOTAL, CKMB, CKMBINDEX, TROPONINI in the last 168 hours. ? ?BNP: ?Invalid input(s): POCBNP ? ?CBG: ?Recent Labs  ?Lab 04/11/22 ?1557 04/11/22 ?2014  ?GLUCAP 72 66*  ? ? ?Microbiology: ?Results for orders placed or performed during the hospital encounter of 11/02/20  ?Respiratory Panel by RT PCR (Flu A&B, Covid) - Nasopharyngeal Swab     Status: None  ? Collection Time: 11/02/20  3:32 PM  ? Specimen: Nasopharyngeal Swab  ?Result Value Ref Range Status  ? SARS Coronavirus 2 by RT PCR NEGATIVE NEGATIVE Final  ?  Comment: (NOTE) ?SARS-CoV-2 target nucleic acids are NOT DETECTED. ? ?The SARS-CoV-2 RNA is generally detectable in upper respiratoy ?specimens during the acute phase of infection. The lowest ?concentration of SARS-CoV-2 viral copies this assay can detect is ?131 copies/mL. A negative result does not preclude SARS-Cov-2 ?infection and should not be used as the sole basis for treatment or ?other patient management decisions. A negative result may occur with  ?improper specimen collection/handling, submission  of specimen other ?than nasopharyngeal swab, presence of viral mutation(s) within the ?areas targeted by this assay, and inadequate number of viral copies ?(<131 copies/mL). A negative result must be combined with clinical ?observations, patient history, and epidemiological information. The ?expected result is Negative. ? ?Fact Sheet for Patients:  ?PinkCheek.be ? ?Fact Sheet for Healthcare Providers:  ?GravelBags.it ? ?This test is no t yet approved or cleared by the [place] FDA and  ?has been authorized for detection and/or diagnosis of SARS-CoV-2 by ?FDA under an Emergency Use Authorization  (EUA). This EUA will remain  ?in effect (meaning this test can be used) for the duration of the ?COVID-19 declaration under Section 564(b)(1) of the Act, 21 U.S.C. ?section 360bbb-3(b)(1), unless the authorization is terminated or ?revoked sooner. ? ?  ? Influenza A by PCR NEGATIVE NEGATIVE Final  ? Influenza B by PCR NEGATIVE NEGATIVE Final  ?  Comment: (NOTE) ?The Xpert Xpress SARS-CoV-2/FLU/RSV assay is intended as an aid in  ?the diagnosis of influenza from Nasopharyngeal swab specimens and  ?should not be used as a sole basis for treatment. Nasal washings and  ?aspirates are unacceptable for Xpert Xpress SARS-CoV-2/FLU/RSV  ?testing. ? ?Fact Sheet for Patients: ?PinkCheek.be ? ?Fact Sheet for Healthcare Providers: ?GravelBags.it ? ?This test is not yet approved or cleared by the [place] FDA and  ?has been authorized for detection and/or diagnosis of SARS-CoV-2 by  ?FDA under an Emergency Use Authorization (EUA). This EUA will remain  ?in effect (meaning this test can be used) for the duration of the  ?Covid-19 declaration under Section 564(b)(1) of the Act, 21  ?U.S.C. section 360bbb-3(b)(1), unless the authorization is  ?terminated or revoked. ?Performed at [facility], [place], ?[place] [address] ?  ?Blood Culture (routine x 2)     Status: Abnormal  ? Collection Time: 11/02/20  4:44 PM  ? Specimen: BLOOD  ?Result Value Ref Range Status  ? Specimen Description   Final  ?  BLOOD RIGHT ANTECUBITAL ?Performed at [facility], [address] [place], [place] [address] ?  ? Special Requests   Final  ?  BOTTLES DRAWN AEROBIC AND ANAEROBIC Blood Culture results may not be optimal due to an inadequate volume of blood received in culture bottles ?Performed at [facility], [address] [place], [place] [address] ?  ? Culture  Setup Time   Final  ?  GRAM POSITIVE COCCI ?IN BOTH AEROBIC AND ANAEROBIC  BOTTLES ?CRITICAL RESULT CALLED TO, READ BACK BY AND VERIFIED WITH: Violeta Gelinas AT 2130 11/03/20 SDR ?Performed at [facility], [address], [place], [place] [address] ?  ? Culture ENTEROCOCCUS FAECALIS (A)  Final  ? Report Status 11/05/2020 FINAL  Final  ? Organism ID, Bacteria ENTEROCOCCUS FAECALIS  Final  ?    Susceptibility  ? Enterococcus faecalis - MIC*  ?  AMPICILLIN <=2 SENSITIVE Sensitive   ?  VANCOMYCIN 1 SENSITIVE Sensitive   ?  GENTAMICIN SYNERGY SENSITIVE Sensitive   ?  * ENTEROCOCCUS FAECALIS  ?Blood Culture ID Panel (Reflexed)     Status: Abnormal  ? Collection Time: 11/02/20  4:44 PM  ?Result Value Ref Range Status  ? Enterococcus faecalis DETECTED (A) NOT DETECTED Final  ?  Comment: CRITICAL RESULT CALLED TO, READ BACK BY AND VERIFIED WITH: Joni Fears AT 4696 11/03/20 SDR ?  ? Enterococcus Faecium NOT DETECTED NOT DETECTED Final  ? Listeria monocytogenes NOT DETECTED NOT DETECTED Final  ? Staphylococcus  species NOT DETECTED NOT DETECTED Final  ? Staphylococcus aureus (BCID) NOT DETECTED NOT DETECTED Final  ? Staphylococcus epidermidis NOT DETECTED NOT DETECTED Final  ? Staphylococcus lugdunensis NOT DETECTED NOT DETECTED Final  ? Streptococcus species NOT DETECTED NOT DETECTED Final  ? Streptococcus agalactiae NOT DETECTED NOT DETECTED Final  ? Streptococcus pneumoniae NOT DETECTED NOT DETECTED Final  ? Streptococcus pyogenes NOT DETECTED NOT DETECTED Final  ? A.calcoaceticus-baumannii NOT DETECTED NOT DETECTED Final  ? Bacteroides fragilis NOT DETECTED NOT DETECTED Final  ? Enterobacterales NOT DETECTED NOT DETECTED Final  ? Enterobacter cloacae complex NOT DETECTED NOT DETECTED Final  ? Escherichia coli NOT DETECTED NOT DETECTED Final  ? Klebsiella aerogenes NOT DETECTED NOT DETECTED Final  ? Klebsiella oxytoca NOT DETECTED NOT DETECTED Final  ? Klebsiella pneumoniae NOT DETECTED NOT DETECTED Final  ? Proteus species NOT DETECTED NOT DETECTED Final  ? Salmonella species NOT DETECTED  NOT DETECTED Final  ? Serratia marcescens NOT DETECTED NOT DETECTED Final  ? Haemophilus influenzae NOT DETECTED NOT DETECTED Final  ? Neisseria meningitidis NOT DETECTED NOT DETECTED Final  ? Pseudomonas aerugino

## 2022-04-13 LAB — CBC
HCT: 38.1 % (ref 36.0–46.0)
Hemoglobin: 12.2 g/dL (ref 12.0–15.0)
MCH: 27.1 pg (ref 26.0–34.0)
MCHC: 32 g/dL (ref 30.0–36.0)
MCV: 84.7 fL (ref 80.0–100.0)
Platelets: 134 10*3/uL — ABNORMAL LOW (ref 150–400)
RBC: 4.5 MIL/uL (ref 3.87–5.11)
RDW: 17.9 % — ABNORMAL HIGH (ref 11.5–15.5)
WBC: 3 10*3/uL — ABNORMAL LOW (ref 4.0–10.5)
nRBC: 0 % (ref 0.0–0.2)

## 2022-04-13 LAB — PHOSPHORUS: Phosphorus: 3.9 mg/dL (ref 2.5–4.6)

## 2022-04-13 LAB — MAGNESIUM: Magnesium: 1.4 mg/dL — ABNORMAL LOW (ref 1.7–2.4)

## 2022-04-13 LAB — BASIC METABOLIC PANEL
Anion gap: 13 (ref 5–15)
BUN: 55 mg/dL — ABNORMAL HIGH (ref 6–20)
CO2: 25 mmol/L (ref 22–32)
Calcium: 8.7 mg/dL — ABNORMAL LOW (ref 8.9–10.3)
Chloride: 101 mmol/L (ref 98–111)
Creatinine, Ser: 9.92 mg/dL — ABNORMAL HIGH (ref 0.44–1.00)
GFR, Estimated: 4 mL/min — ABNORMAL LOW (ref >60)
Glucose, Bld: 93 mg/dL (ref 70–99)
Potassium: 4.2 mmol/L (ref 3.5–5.1)
Sodium: 139 mmol/L (ref 135–145)

## 2022-04-13 LAB — HEPATITIS B SURFACE ANTIBODY, QUANTITATIVE: Hep B S AB Quant (Post): 20.3 m[IU]/mL (ref >9.9)

## 2022-04-13 MED ORDER — METOPROLOL TARTRATE 75 MG PO TABS: 75.0000 mg | ORAL_TABLET | Freq: Two times a day (BID) | ORAL | 1 refills | Status: DC

## 2022-04-13 MED ORDER — METOPROLOL TARTRATE 50 MG PO TABS
75.0000 mg | ORAL_TABLET | Freq: Two times a day (BID) | ORAL | Status: DC
  Filled 2022-04-13: qty 1.5

## 2022-04-13 MED ORDER — CINACALCET HCL 30 MG PO TABS: 30.0000 mg | ORAL_TABLET | ORAL | 0 refills | Status: DC

## 2022-04-13 MED ORDER — CALCIUM ACETATE (PHOS BINDER) 667 MG PO CAPS
1334.0000 mg | ORAL_CAPSULE | Freq: Three times a day (TID) | ORAL | 0 refills | Status: DC
Start: 2022-04-13 — End: 2022-09-07

## 2022-04-13 MED ORDER — DULOXETINE HCL 30 MG PO CPEP: 30.0000 mg | ORAL_CAPSULE | Freq: Every day | ORAL | 3 refills | Status: DC

## 2022-04-13 MED ORDER — MAGNESIUM SULFATE 2 GM/50ML IV SOLN
2.0000 g | Freq: Once | INTRAVENOUS | Status: AC
  Administered 2022-04-13: 2 g via INTRAVENOUS
  Filled 2022-04-13: qty 50

## 2022-04-13 MED ORDER — METOPROLOL TARTRATE 50 MG PO TABS
75.0000 mg | ORAL_TABLET | Freq: Two times a day (BID) | ORAL | Status: DC
  Administered 2022-04-13: 75 mg via ORAL
  Filled 2022-04-13: qty 1

## 2022-04-13 MED ORDER — ACETAMINOPHEN 500 MG PO TABS
ORAL_TABLET | ORAL | Status: AC
  Filled 2022-04-13: qty 2

## 2022-04-13 NOTE — Discharge Summary (Signed)
?Physician Discharge Summary ?  ?Patient: Janet Olson MRN: [id number] DOB: 03-Jul-1970  ?Admit date:     04/11/2022  ?Discharge date: 04/13/22  ?Discharge Physician: Shawna Clamp  ? ?PCP: Remi Haggard, FNP  ? ?Recommendations at discharge:  ?Advised to follow-up with primary care physician in 1 week. ?Advised to continue hemodialysis as scheduled from Monday. ?Importance of compliance with hemodialysis discussed in detail. ? ?Discharge Diagnoses: ?Principal Problem: ?  Acute uremia ?Active Problems: ?  ESRD on dialysis [facility]) ?  Hyperkalemia ?  Altered mental status ?  COPD (chronic obstructive pulmonary disease) ([place]) ?  Depression ?  Diastolic dysfunction with chronic heart failure ([place]) ?  Benign essential HTN ? ?Resolved Problems: ?  * No resolved hospital problems. * ? ?Hospital Course: ?This 52 years old female with PMH significant for ESRD on hemodialysis, anemia of chronic disease presented in ED  with concerns about altered mental status.  History is obtained from ED chart and EMS report.  Family reports patient was not been right most of the day and afternoon family noted her falling in the bathroom,  Patient denies any head injury. Patient seems to be having depression and apparently patient has not had dialysis for a week.  In the ED patient was minimally responsive,  vitals were stable. Nephrology was consulted.  Patient underwent emergent hemodialysis. ?Patient was admitted for uremic encephalopathy secondary to missed hemodialysis.  Patient underwent 3 sessions of dialysis, back to her baseline mental status.  All antidepressant medications continued.  Patient is cleared from nephrology to be discharged and continue dialysis as per schedule.  Importance of continuation of hemodialysis explained in detail,  she understood.  Patient is being discharged home ? ?Assessment and Plan: ?Altered mental status ?Baseline mentation unknown but evidently has been functioning at home until yesterday. Mental  status back to baseline after having emergent dialysis. ? ?Hyperkalemia ?Likely related to the missed hemodialysis.  ? EKG no acute changes. ?Patient underwent emergent hemodialysis. ?Potassium improved.  Continue to monitor. ? ? ? ?ESRD on dialysis [facility]) ?Patient missed hemodialysis, presented with uremic encephalopathy /altered mentation.  ?Patient underwent emergent hemodialysis 04/11/22 ?Continue hemodialysis as per schedule on Monday. ? ?Depression ?Patient remains on multiple medication.  Cannot assess adherence. ?Patient seems depressed,  has been missing hemodialysis. ?Resume home antidepressant medications. ? ?COPD (chronic obstructive pulmonary disease) ([place]) ?Appears compensated.  Continue home inhalers. ? ?Benign essential HTN ?Hold blood pressure medication since having soft BP. ? ?Diastolic dysfunction with chronic heart failure ([place]) ?BNP found to be 2038.6, consistent with fluid overload. ?She seems much improved after having dialysis. ?Continue hemodialysis as per schedule. ?Continue daily weight, intake output charting. ? ? ? ?Pain control - [employer] Controlled Substance Reporting System database was reviewed. and patient was instructed, not to drive, operate heavy machinery, perform activities at heights, swimming or participation in water activities or provide baby-sitting services while on Pain, Sleep and Anxiety Medications; until their outpatient Physician has advised to do so again. Also recommended to not to take more than prescribed Pain, Sleep and Anxiety Medications.  ? ?Consultants: Nephrology ? ?Procedures performed: Hemodialysis ? ?Disposition: Home ? ?Diet recommendation:  ?Discharge Diet Orders (From admission, onward)  ? ?  Start     Ordered  ? 04/13/22 0000  Diet - low sodium heart healthy       ? 04/13/22 1111  ? 04/13/22 0000  Diet Carb Modified       ? 04/13/22 1111  ? ?  ?  ? ?  ? ?  Renal diet ?DISCHARGE MEDICATION: ?Allergies as of 04/13/2022   ?No Known Allergies ?  ? ?   ?Medication List  ?  ? ?STOP taking these medications   ? ?Auryxia 1 GM 210 MG(Fe) tablet ?Generic drug: ferric citrate ?  ?benzonatate 200 MG capsule ?Commonly known as: TESSALON ?  ?chlorpheniramine-HYDROcodone 10-8 MG/5ML Suer ?Commonly known as: TUSSIONEX ?  ?docusate sodium 100 MG capsule ?Commonly known as: COLACE ?  ?neomycin-polymyxin-hydrocortisone 3.5-10000-1 OTIC suspension ?Commonly known as: CORTISPORIN ?  ?ondansetron 4 MG disintegrating tablet ?Commonly known as: ZOFRAN-ODT ?  ?oxyCODONE 5 MG immediate release tablet ?Commonly known as: Roxicodone ?  ?traMADol 50 MG tablet ?Commonly known as: ULTRAM ?  ? ?  ? ?TAKE these medications   ? ?acetaminophen 500 MG tablet ?Commonly known as: TYLENOL ?Take 2 tablets (1,000 mg total) by mouth every 8 (eight) hours. ?  ?albuterol (2.5 MG/3ML) 0.083% nebulizer solution ?Commonly known as: PROVENTIL ?Take 2.5 mg by nebulization every 6 (six) hours as needed for wheezing or shortness of breath. ?  ?albuterol 108 (90 Base) MCG/ACT inhaler ?Commonly known as: VENTOLIN HFA ?Inhale 2 puffs into the lungs every 6 (six) hours as needed for wheezing or shortness of breath. ?  ?amLODipine 10 MG tablet ?Commonly known as: NORVASC ?Take 1 tablet (10 mg total) by mouth daily. ?  ?calcium acetate 667 MG capsule ?Commonly known as: PHOSLO ?Take 2 capsules (1,334 mg total) by mouth 3 (three) times daily with meals. ?  ?cinacalcet 30 MG tablet ?Commonly known as: SENSIPAR ?Take 1 tablet (30 mg total) by mouth every Monday, Wednesday, and Friday. ?  ?Dulera 200-5 MCG/ACT Aero ?Generic drug: mometasone-formoterol ?Inhale 2 puffs into the lungs 2 (two) times daily. ?  ?DULoxetine 30 MG capsule ?Commonly known as: CYMBALTA ?Take 1 capsule (30 mg total) by mouth daily. ?What changed:  ?medication strength ?how much to take ?when to take this ?  ?hydrOXYzine 25 MG tablet ?Commonly known as: ATARAX ?Take 25 mg by mouth in the morning. ?  ?lidocaine-prilocaine cream ?Commonly known as:  EMLA ?1 application as needed (prior to port being accessed). ?  ?linaclotide 72 MCG capsule ?Commonly known as: LINZESS ?Take 72 mcg by mouth daily as needed (constipation.). ?  ?Metoprolol Tartrate 75 MG Tabs ?Take 75 mg by mouth 2 (two) times daily. ?What changed:  ?medication strength ?how much to take ?  ?multivitamin Tabs tablet ?Take 1 tablet by mouth daily. ?  ?pantoprazole 40 MG tablet ?Commonly known as: PROTONIX ?Take 1 tablet (40 mg total) by mouth daily. ?  ?QUEtiapine 25 MG tablet ?Commonly known as: SEROQUEL ?Take 25 mg by mouth at bedtime. ?  ?QUEtiapine 100 MG tablet ?Commonly known as: SEROQUEL ?Take 100 mg by mouth at bedtime. ?  ?torsemide 100 MG tablet ?Commonly known as: DEMADEX ?Take 1 tablet (100 mg total) by mouth daily. ?  ?Vitamin D (Ergocalciferol) 1.25 MG (50000 UNIT) Caps capsule ?Commonly known as: DRISDOL ?Take 50,000 Units by mouth every Friday. ?  ? ?  ? ? Follow-up Information   ? ? Remi Haggard, FNP Follow up in 1 week(s).   ?Specialty: Family Medicine ?Contact information: ?[address] ?[place] [address] ?[phone] ? ? ?  ?  ? ? Minna Merritts, MD .   ?Specialty: Cardiology ?Contact information: ?[address] ?[place] [address] ?[phone] ? ? ?  ?  ? ?  ?  ? ?  ? ?Discharge Exam: ?Filed Weights  ? 04/12/22 1052 04/13/22  6378 04/13/22 1252  ?Weight: 86.9 kg 85.1 kg 85.2 kg  ? ?General exam: Appears comfortable, not in any acute distress.   ?Respiratory system: CTA bilaterally, no wheezing, no crackles, normal respiratory effort. ?Cardiovascular system: S1-S2 heard, regular rate and rhythm, no murmur. ?Gastrointestinal system: Abdomen is soft, non tender, non distended, BS+ ?Central nervous system: Alert, oriented x 3, no focal neurological deficits. ?Extremities: No edema, no cyanosis, no clubbing. ?Psychiatry: Mood, insight, judgment normal. ? ?Condition at discharge: good ? ?The results of significant diagnostics from this  hospitalization (including imaging, microbiology, ancillary and laboratory) are listed below for reference.  ? ?Imaging Studies: ?CT Head Wo Contrast ? ?Result Date: 04/11/2022 ?CLINICAL DATA:  Altered mental statu

## 2022-04-13 NOTE — Discharge Instructions (Signed)
Advised to follow-up with primary care physician in 1 week. ?Advised to continue hemodialysis as scheduled. ?Importance of compliance with hemodialysis discussed in detail. ?

## 2022-04-13 NOTE — Progress Notes (Signed)
?Janet Olson  ?ROUNDING NOTE  ? ?Subjective:  ?Patient seen and evaluated during hemodialysis treatment. ?Appears to be tolerating well. ?Much more awake and alert now. ? ? ?Objective:  ?Vital signs in last 24 hours:  ?Temp:  [97.8 ?F (36.6 ?C)-98.8 ?F (37.1 ?C)] 98.8 ?F (37.1 ?C) (04/14 1247) ?Pulse Rate:  [91-114] 103 (04/14 1252) ?Resp:  [14-25] 25 (04/14 1252) ?BP: (129-171)/(67-105) 156/92 (04/14 1252) ?SpO2:  [91 %-99 %] 92 % (04/14 1252) ?Weight:  [85.1 kg-85.2 kg] 85.2 kg (04/14 1252) ? ?Weight change: -5.8 kg ?Filed Weights  ? 04/12/22 1052 04/13/22 0931 04/13/22 1252  ?Weight: 86.9 kg 85.1 kg 85.2 kg  ? ? ?Intake/Output: ?I/O last 3 completed shifts: ?In: 100 [P.O.:100] ?Out: 1972 [Other:1972] ?  ?Intake/Output this shift: ? Total I/O ?In: -  ?Out: 829 [FAOZH:086] ? ?Physical Exam: ?General: No acute distress  ?Head: Normocephalic, atraumatic. Moist oral mucosal membranes  ?Eyes: Anicteric  ?Neck: Supple  ?Lungs:  Clear to auscultation, normal effort  ?Heart: S1S2 no rubs  ?Abdomen:  Soft, nontender, bowel sounds present  ?Extremities: Trace lower extremity edema  ?Neurologic: Awake, alert, following commands  ?Skin: No acute rash  ?Access: Left upper extremity AV fistula  ? ? ?Basic Metabolic Panel: ?Recent Labs  ?Lab 04/11/22 ?1559 04/12/22 ?5784 04/12/22 ?0930 04/13/22 ?6962  ?NA 134* 136 135 139  ?K 6.4* 4.9 4.5 4.2  ?CL 95* 96* 100 101  ?CO2 18* 22 21* 25  ?GLUCOSE 84 106* 100* 93  ?BUN 118* 91* 90* 55*  ?CREATININE 16.08* 13.04* 13.46* 9.92*  ?CALCIUM 8.4* 8.9 8.1* 8.7*  ?MG  --   --   --  1.4*  ?PHOS  --  5.5* 5.7* 3.9  ? ? ? ?Liver Function Tests: ?Recent Labs  ?Lab 04/11/22 ?1559 04/12/22 ?9528 04/12/22 ?0930  ?AST 22  --   --   ?ALT 16  --   --   ?ALKPHOS 203*  --   --   ?BILITOT 1.2  --   --   ?PROT 8.6*  --   --   ?ALBUMIN 4.2 3.9 3.2*  ? ? ?No results for input(s): LIPASE, AMYLASE in the last 168 hours. ?No results for input(s): AMMONIA in the last 168 hours. ? ?CBC: ?Recent Labs   ?Lab 04/11/22 ?1559 04/12/22 ?4132 04/12/22 ?1120 04/13/22 ?4401  ?WBC 4.0 4.0 3.2* 3.0*  ?NEUTROABS 3.1  --   --   --   ?HGB 12.5 13.5 11.9* 12.2  ?HCT 38.8 41.0 36.2 38.1  ?MCV 84.7 82.3 82.8 84.7  ?PLT 214 170 137* 134*  ? ? ? ?Cardiac Enzymes: ?No results for input(s): CKTOTAL, CKMB, CKMBINDEX, TROPONINI in the last 168 hours. ? ?BNP: ?Invalid input(s): POCBNP ? ?CBG: ?Recent Labs  ?Lab 04/11/22 ?1557 04/11/22 ?2014  ?GLUCAP 72 66*  ? ? ? ?Microbiology: ?Results for orders placed or performed during the hospital encounter of 11/02/20  ?Respiratory Panel by RT PCR (Flu A&B, Covid) - Nasopharyngeal Swab     Status: None  ? Collection Time: 11/02/20  3:32 PM  ? Specimen: Nasopharyngeal Swab  ?Result Value Ref Range Status  ? SARS Coronavirus 2 by RT PCR NEGATIVE NEGATIVE Final  ?  Comment: (NOTE) ?SARS-CoV-2 target nucleic acids are NOT DETECTED. ? ?The SARS-CoV-2 RNA is generally detectable in upper respiratoy ?specimens during the acute phase of infection. The lowest ?concentration of SARS-CoV-2 viral copies this assay can detect is ?131 copies/mL. A negative result does not preclude SARS-Cov-2 ?infection and should not be  used as the sole basis for treatment or ?other patient management decisions. A negative result may occur with  ?improper specimen collection/handling, submission of specimen other ?than nasopharyngeal swab, presence of viral mutation(s) within the ?areas targeted by this assay, and inadequate number of viral copies ?(<131 copies/mL). A negative result must be combined with clinical ?observations, patient history, and epidemiological information. The ?expected result is Negative. ? ?Fact Sheet for Patients:  ?PinkCheek.be ? ?Fact Sheet for Healthcare Providers:  ?GravelBags.it ? ?This test is no t yet approved or cleared by the [place] FDA and  ?has been authorized for detection and/or diagnosis of SARS-CoV-2 by ?FDA under an  Emergency Use Authorization (EUA). This EUA will remain  ?in effect (meaning this test can be used) for the duration of the ?COVID-19 declaration under Section 564(b)(1) of the Act, 21 U.S.C. ?section 360bbb-3(b)(1), unless the authorization is terminated or ?revoked sooner. ? ?  ? Influenza A by PCR NEGATIVE NEGATIVE Final  ? Influenza B by PCR NEGATIVE NEGATIVE Final  ?  Comment: (NOTE) ?The Xpert Xpress SARS-CoV-2/FLU/RSV assay is intended as an aid in  ?the diagnosis of influenza from Nasopharyngeal swab specimens and  ?should not be used as a sole basis for treatment. Nasal washings and  ?aspirates are unacceptable for Xpert Xpress SARS-CoV-2/FLU/RSV  ?testing. ? ?Fact Sheet for Patients: ?PinkCheek.be ? ?Fact Sheet for Healthcare Providers: ?GravelBags.it ? ?This test is not yet approved or cleared by the [place] FDA and  ?has been authorized for detection and/or diagnosis of SARS-CoV-2 by  ?FDA under an Emergency Use Authorization (EUA). This EUA will remain  ?in effect (meaning this test can be used) for the duration of the  ?Covid-19 declaration under Section 564(b)(1) of the Act, 21  ?U.S.C. section 360bbb-3(b)(1), unless the authorization is  ?terminated or revoked. ?Performed at [facility], [place], ?[place] [address] ?  ?Blood Culture (routine x 2)     Status: Abnormal  ? Collection Time: 11/02/20  4:44 PM  ? Specimen: BLOOD  ?Result Value Ref Range Status  ? Specimen Description   Final  ?  BLOOD RIGHT ANTECUBITAL ?Performed at [facility], [address] [place], [place] [address] ?  ? Special Requests   Final  ?  BOTTLES DRAWN AEROBIC AND ANAEROBIC Blood Culture results may not be optimal due to an inadequate volume of blood received in culture bottles ?Performed at [facility], [address] [place], [place] [address] ?  ? Culture  Setup Time   Final  ?  GRAM POSITIVE COCCI ?IN BOTH  AEROBIC AND ANAEROBIC BOTTLES ?CRITICAL RESULT CALLED TO, READ BACK BY AND VERIFIED WITH: Violeta Gelinas AT 7654 11/03/20 SDR ?Performed at [facility], [place] [address], [place], [place] [address] ?  ? Culture ENTEROCOCCUS FAECALIS (A)  Final  ? Report Status 11/05/2020 FINAL  Final  ? Organism ID, Bacteria ENTEROCOCCUS FAECALIS  Final  ?    Susceptibility  ? Enterococcus faecalis - MIC*  ?  AMPICILLIN <=2 SENSITIVE Sensitive   ?  VANCOMYCIN 1 SENSITIVE Sensitive   ?  GENTAMICIN SYNERGY SENSITIVE Sensitive   ?  * ENTEROCOCCUS FAECALIS  ?Blood Culture ID Panel (Reflexed)     Status: Abnormal  ? Collection Time: 11/02/20  4:44 PM  ?Result Value Ref Range Status  ? Enterococcus faecalis DETECTED (A) NOT DETECTED Final  ?  Comment: CRITICAL RESULT CALLED TO, READ BACK BY AND VERIFIED WITH: ?JASON ROBBONS AT 4656 11/03/20  SDR ?  ? Enterococcus Faecium NOT DETECTED NOT DETECTED Final  ? Listeria monocytogenes NOT DETECTED NOT DETECTED Final  ? Staphylococcus species NOT DETECTED NOT DETECTED Final  ? Staphylococcus aureus (BCID) NOT DETECTED NOT DETECTED Final  ? Staphylococcus epidermidis NOT DETECTED NOT DETECTED Final  ? Staphylococcus lugdunensis NOT DETECTED NOT DETECTED Final  ? Streptococcus species NOT DETECTED NOT DETECTED Final  ? Streptococcus agalactiae NOT DETECTED NOT DETECTED Final  ? Streptococcus pneumoniae NOT DETECTED NOT DETECTED Final  ? Streptococcus pyogenes NOT DETECTED NOT DETECTED Final  ? A.calcoaceticus-baumannii NOT DETECTED NOT DETECTED Final  ? Bacteroides fragilis NOT DETECTED NOT DETECTED Final  ? Enterobacterales NOT DETECTED NOT DETECTED Final  ? Enterobacter cloacae complex NOT DETECTED NOT DETECTED Final  ? Escherichia coli NOT DETECTED NOT DETECTED Final  ? Klebsiella aerogenes NOT DETECTED NOT DETECTED Final  ? Klebsiella oxytoca NOT DETECTED NOT DETECTED Final  ? Klebsiella pneumoniae NOT DETECTED NOT DETECTED Final  ? Proteus species NOT DETECTED NOT DETECTED Final  ?  Salmonella species NOT DETECTED NOT DETECTED Final  ? Serratia marcescens NOT DETECTED NOT DETECTED Final  ? Haemophilus influenzae NOT DETECTED NOT DETECTED Final  ? Neisseria meningitidis NOT DETECTED NOT DETECTED F

## 2022-04-13 NOTE — Plan of Care (Signed)
Patient discharge instructions, medication changes, and appointment follow-up reviewed with patient who verbalized understanding.  PIV removed with tip intact.  ?

## 2022-04-13 NOTE — Progress Notes (Signed)
Hemodialysis Post Treatment Note ?13 April 2022 ?Access: Left Upper Arm Fistula  ?Treatment Time: 3 hours ?UF Removed:  1000 ml ?Note: ?Patient completes scheduled 3-hour hemodialysis treatment, without incident. LUA AVF cannulates with ease, blood flow rate maintained throughout the course of treatment. Targeted UF met with 1-liter fluid removal.  Vital signs are at baseline for patient, minimal post treatment bleeding, improved mentation. Patient is anticipating discharge.   ?

## 2022-04-16 ENCOUNTER — Other Ambulatory Visit: Payer: Self-pay | Admitting: Family Medicine

## 2022-04-16 DIAGNOSIS — Z1231 Encounter for screening mammogram for malignant neoplasm of breast: Secondary | ICD-10-CM

## 2022-04-26 ENCOUNTER — Other Ambulatory Visit: Payer: Self-pay

## 2022-04-26 ENCOUNTER — Encounter: Payer: Self-pay | Admitting: Gastroenterology

## 2022-04-26 ENCOUNTER — Ambulatory Visit (INDEPENDENT_AMBULATORY_CARE_PROVIDER_SITE_OTHER): Payer: Medicare Other | Admitting: Gastroenterology

## 2022-04-26 ENCOUNTER — Ambulatory Visit: Payer: Medicare Other | Admitting: Gastroenterology

## 2022-04-26 VITALS — BP 113/77 | HR 118 | Temp 98.6°F | Ht 66.0 in | Wt 186.5 lb

## 2022-04-26 DIAGNOSIS — K625 Hemorrhage of anus and rectum: Secondary | ICD-10-CM

## 2022-04-26 DIAGNOSIS — K641 Second degree hemorrhoids: Secondary | ICD-10-CM | POA: Diagnosis not present

## 2022-04-26 DIAGNOSIS — K5904 Chronic idiopathic constipation: Secondary | ICD-10-CM

## 2022-04-26 NOTE — Progress Notes (Signed)
?  ?Cephas Darby, MD ?[address]  ?[address]  ?[place], [place] [address]  ?Main: [phone]  ?Fax: [phone] ? ? ? ?Gastroenterology Consultation ? ?Referring Provider:     Remi Haggard, FNP ?Primary Care Physician:  Remi Haggard, FNP ?Primary Gastroenterologist:  Dr. Cephas Darby ?Reason for Consultation:   Symptomatic hemorrhoids, rectal bleeding and constipation     ?  ? HPI:   ?Janet Olson is a 52 y.o. female referred by Dr. Dewitt Hoes for consultation & management of ascites, fatty liver.  Patient has end-stage renal disease, currently on hemodialysis who has been noticing several months of gradually worsening abdominal distention, puffiness of her eyes, shortness of breath, swelling of legs.  She underwent ultrasound of her abdomen by Dr. Stefani Dama and was found to have moderate ascites as well as fatty liver, patent portal vein, no splenomegaly.  Therefore, she is referred to GI for further evaluation.  Patient reports that she still makes urine and currently taking low-dose furosemide in addition to undergoing hemodialysis.  She does report severe constipation associated with scant amount of bright red blood per rectum.  She has Linzess 145 MCG samples which she just started taking.  Her main concern today is abdominal discomfort from distention that has resulted in decreased p.o. intake, unable to lay flat and lower abdominal pain.  She does acknowledge eating chips regularly.  She does smoke cigarettes regularly.  She denies fever, chills, nausea or vomiting, melena.  Due to ongoing discomfort, patient went to [facility] [place] on 1/3.  She underwent CBC which was unremarkable, hemoglobin 12, platelets 232, WBC 6.4, LFTs revealed albumin 4, AST 25, ALT 22, alkaline phosphatase 216.  Her BNP was significantly elevated she underwent CT abdomen and pelvis without contrast which revealed diffuse body wall edema and large volume ascites as well as mild umbilical hernia.  Chest  x-ray revealed cardiomegaly and diffuse bilateral interstitial infiltrates.  She does not have a cardiologist.  CT did not reveal cirrhosis or splenomegaly or portal hypertension ? ?Echocardiogram in 04/2017 revealed EF of 55 to 71%, grade 1 diastolic dysfunction, mild pericardial effusion ? ?Follow-up visit 01/24/2021 ?Patient made an appointment to see me for intermittent bright red blood per rectum associated with rectal pain, discomfort, itching as well as prolapse that has been ongoing for few months.  Patient also has chronic intermittent constipation for which she tried Linzess prescribed by her PCP.  She failed Linzess helped but it resulted in severe diarrhea, therefore stopped taking it.  She is interested in hemorrhoid ligation.  Patient reported that she tried several different over-the-counter medications and has spent a lot of money on it however her hemorrhoidal symptoms are still persistent.  She has paracentesis once about a month for her ascites. ? ?Patient has been started on Creon by her PCP for abdominal bloating, patient is not sure if it is helping.  No known evidence of EPI or chronic pancreatitis ? ?Follow-up visit 04/26/2022 ?Patient is here for follow-up of worsening of constipation and recurrence of rectal bleeding.  She is out of Linzess and she was taking 72 mcg daily.  She states that she was in depression, missed her dialysis and was hospitalized at [facility] in mid April 2023.  Her bowel movements have been hard, feels bloated and not drinking adequate amount of water, not having adequate amount of fiber.  She noticed that fried foods, hamburger makes her bowels hard.  She also reports rectal bleeding.  She had hemorrhoid ligation in 2022. ? ?NSAIDs: none ? ?Antiplts/Anticoagulants/Anti thrombotics: none ? ?GI Procedures: Colonoscopy for rectal bleeding, 04/24/2018 ?- The examined portion of the ileum was normal. ?- Three diminutive polyps in the rectum and in the cecum, removed with a cold  biopsy forceps. Resected and ?retrieved. ?- Moderate diverticulosis in the sigmoid colon. There was no evidence of diverticular bleeding. ?- Internal hemorrhoids. ?DIAGNOSIS:  ?A.  COLON POLYP, CECUM; COLD BIOPSY:  ?- COLONIC MUCOSA WITH PROMINENT LYMPHOID AGGREGATE.  ?- NEGATIVE FOR DYSPLASIA AND MALIGNANCY.  ? ?B.  RECTUM POLYP; COLD BIOPSY:  ?- EARLY HYPERPLASTIC POLYP.  ?- NEGATIVE FOR DYSPLASIA AND MALIGNANCY.  ? ?She denies family history of GI malignancy in first-degree or second-degree relatives ?She did not have any GI surgeries ? ?Past Medical History:  ?Diagnosis Date  ? (HFpEF) heart failure with preserved ejection fraction ([place])   ? a. 2015 Echo: Nl EF. Mild to mod LVH. Mild PR/MR; b. 04/2017 Echo: EF 55-60%, no rwma, Gr1 DD. Mildly dil LA. Small effusion; c. 12/2018 Echo: EF 55-60%, mildly dil LA/PA. Mod dil RA. Mild to mod RV dil. Mildly reduced RV fxn. Mild TR. Mod elev PASP.  ? Abdominal ascites   ? Acute hypoxemic respiratory failure ([place]) 01/03/2019  ? Acute renal failure (ARF) ([place]) 05/17/2017  ? Acute respiratory failure ([place]) 09/25/2018  ? Anemia   ? Anxiety   ? Arthritis   ? Asthma   ? Cardiomegaly   ? CHF (congestive heart failure) ([place])   ? COPD (chronic obstructive pulmonary disease) ([place])   ? Depression   ? Dialysis patient [facility])   ? MON, WED , FRI  ? Dyspnea   ? ESRD (end stage renal disease) ([place])   ? GERD (gastroesophageal reflux disease)   ? Headache   ? Migraines  ? Hypertension   ? Nonrheumatic tricuspid (valve) insufficiency   ? Respiratory failure with hypoxia ([place]) 04/21/2018  ? Ulcer   ? gastric  ? Vitamin D deficiency   ? ? ?Past Surgical History:  ?Procedure Laterality Date  ? A/V FISTULAGRAM Left 04/17/2018  ? Procedure: A/V FISTULAGRAM;  Surgeon: Algernon Huxley, MD;  Location: [facility];  Service: Cardiovascular;  Laterality: Left;  ? A/V FISTULAGRAM Left 05/27/2018  ? Procedure: A/V FISTULAGRAM;  Surgeon: Katha Cabal, MD;  Location: [facility];   Service: Cardiovascular;  Laterality: Left;  ? A/V FISTULAGRAM Left 07/05/2020  ? Procedure: A/V FISTULAGRAM;  Surgeon: Katha Cabal, MD;  Location: [facility];  Service: Cardiovascular;  Laterality: Left;  ? AV FISTULA PLACEMENT Left 08/08/2017  ? Procedure: ARTERIOVENOUS (AV) FISTULA CREATION ( BRACHIOCEPHALIC );  Surgeon: Algernon Huxley, MD;  Location: [facility];  Service: Vascular;  Laterality: Left;  ? AV FISTULA PLACEMENT Left 08/05/2020  ? Procedure: INSERTION OF ARTERIOVENOUS (AV) GRAFT ARM(BRACHIAL AXILLARY);  Surgeon: Katha Cabal, MD;  Location: [facility];  Service: Vascular;  Laterality: Left;  ? CESAREAN SECTION    ? x 4 (1988, 1991, 1997, 2000 )  ? COLONOSCOPY WITH PROPOFOL N/A 04/24/2018  ? Procedure: COLONOSCOPY WITH PROPOFOL;  Surgeon: Lin Landsman, MD;  Location: [facility] END[facility];  Service: Gastroenterology;  Laterality: N/A;  ? DIALYSIS/PERMA CATHETER INSERTION N/A 07/16/2017  ? Procedure: Dialysis/Perma Catheter Insertion;  Surgeon: Katha Cabal, MD;  Location: [facility];  Service: Cardiovascular;  Laterality: N/A;  ? DIALYSIS/PERMA CATHETER REMOVAL N/A 10/29/2017  ? Procedure: DIALYSIS/PERMA CATHETER REMOVAL;  Surgeon: Katha Cabal,  MD;  Location: [facility];  Service: Cardiovascular;  Laterality: N/A;  ? DIALYSIS/PERMA CATHETER REMOVAL N/A 11/03/2020  ? Procedure: DIALYSIS/PERMA CATHETER REMOVAL;  Surgeon: Algernon Huxley, MD;  Location: [facility];  Service: Cardiovascular;  Laterality: N/A;  ? excision of lymph nodes Bilateral 2014  ? bilateral under arms.  ? PARATHYROID IMPLANT REMOVAL    ? REMOVAL OF A DIALYSIS CATHETER Left 08/05/2020  ? Procedure: REMOVAL OF A DIALYSIS CATHETER ( FISTULA );  Surgeon: Katha Cabal, MD;  Location: [facility];  Service: Vascular;  Laterality: Left;  ? SHOULDER ARTHROSCOPY WITH SUBACROMIAL DECOMPRESSION AND OPEN ROTATOR C Right 01/08/2022  ? Procedure: Right shoulder arthroscopic subscapularis repair,  rotator cuff repair, subacromial decompression, and distal clavicle excision;  Surgeon: Leim Fabry, MD;  Location: [facility];  Service: Orthopedics;  Laterality: Right;  ? ? ? ?Current Outpatient Medications:  ?

## 2022-04-27 ENCOUNTER — Ambulatory Visit (INDEPENDENT_AMBULATORY_CARE_PROVIDER_SITE_OTHER): Payer: Medicare Other | Admitting: Nurse Practitioner

## 2022-04-27 ENCOUNTER — Encounter: Payer: Self-pay | Admitting: Nurse Practitioner

## 2022-04-27 VITALS — BP 120/82 | HR 117 | Ht 66.0 in | Wt 187.5 lb

## 2022-04-27 DIAGNOSIS — I5032 Chronic diastolic (congestive) heart failure: Secondary | ICD-10-CM | POA: Diagnosis not present

## 2022-04-27 DIAGNOSIS — Z992 Dependence on renal dialysis: Secondary | ICD-10-CM

## 2022-04-27 DIAGNOSIS — I483 Typical atrial flutter: Secondary | ICD-10-CM | POA: Diagnosis not present

## 2022-04-27 DIAGNOSIS — N186 End stage renal disease: Secondary | ICD-10-CM

## 2022-04-27 DIAGNOSIS — Z720 Tobacco use: Secondary | ICD-10-CM | POA: Diagnosis not present

## 2022-04-27 DIAGNOSIS — J449 Chronic obstructive pulmonary disease, unspecified: Secondary | ICD-10-CM

## 2022-04-27 MED ORDER — APIXABAN 5 MG PO TABS
5.0000 mg | ORAL_TABLET | Freq: Two times a day (BID) | ORAL | 11 refills | Status: DC
Start: 2022-04-27 — End: 2023-05-03

## 2022-04-27 MED ORDER — AMLODIPINE BESYLATE 5 MG PO TABS: 5.0000 mg | ORAL_TABLET | Freq: Every day | ORAL | 3 refills | Status: DC

## 2022-04-27 MED ORDER — METOPROLOL TARTRATE 100 MG PO TABS: 100.0000 mg | ORAL_TABLET | Freq: Two times a day (BID) | ORAL | 3 refills | Status: DC

## 2022-04-27 NOTE — Patient Instructions (Signed)
Medication Instructions:  ?Your physician has recommended you make the following change in your medication:  ? ?INCREASE Metoprolol tartrate to 100 mg twice a day ?DECREASE Amlodipine to 5 mg once a day ?START Elqiuis 5 mg twice a day ? ?*If you need a refill on your cardiac medications before your next appointment, please call your pharmacy* ? ? ?Lab Work: ?None ? ?If you have labs (blood work) drawn today and your tests are completely normal, you will receive your results only by: ?MyChart Message (if you have MyChart) OR ?A paper copy in the mail ?If you have any lab test that is abnormal or we need to change your treatment, we will call you to review the results. ? ? ?Testing/Procedures: ?You are scheduled for a TEE/Cardioversion on Tuesday May 2 with Dr. Rockey Situ. ? ?Please arrive at the [facility] at 06:30 a.m. on the day of your procedure. ? ?DIET INSTRUCTIONS:  ?Nothing to eat or drink after midnight except your medications with a small sip of water. ? ?      ? ?Labs: Labs will be scanned in under Media tab. ? ?Medications:  HOLD Torsemide the morning of your procedure. YOU MAY TAKE ALL of your other remaining medications with a small amount of water. ? ?Must have a responsible person to drive you home. ? ?Bring a current list of your medications and current insurance cards.  ? ? ?If you have any questions after you get home, please call the office at [phone] ? ? ? ?Follow-Up: ?At [facility], you and your health needs are our priority.  As part of our continuing mission to provide you with exceptional heart care, we have created designated Provider Care Teams.  These Care Teams include your primary Cardiologist (physician) and Advanced Practice Providers (APPs -  Physician Assistants and Nurse Practitioners) who all work together to provide you with the care you need, when you need it. ? ?We recommend signing up for the patient portal called "MyChart".  Sign up information is provided on this  After Visit Summary.  MyChart is used to connect with patients for Virtual Visits (Telemedicine).  Patients are able to view lab/test results, encounter notes, upcoming appointments, etc.  Non-urgent messages can be sent to your provider as well.   ?To learn more about what you can do with MyChart, go to [email or web address].   ? ?Your next appointment:   ?3 week(s) ? ?The format for your next appointment:   ?In Person ? ?Provider:   ?Ida Rogue, MD or Murray Hodgkins, NP  ? ? ? ? ?Important Information About Sugar ? ? ? ? ?  ?

## 2022-04-27 NOTE — H&P (View-Only) (Signed)
? ? ?Office Visit  ?  ?Patient Name: Janet Olson ?Date of Encounter: 04/27/2022 ? ?Primary Care Provider:  Remi Haggard, FNP ?Primary Cardiologist:  Ida Rogue, MD ? ?Chief Complaint  ?  ?52 year old female with a history of HFpEF, end-stage renal disease on hemodialysis, ongoing tobacco abuse, COPD, hypertension, GERD, and anemia, who presents for follow-up related to rapid atrial flutter. ? ?Past Medical History  ?  ?Past Medical History:  ?Diagnosis Date  ? (HFpEF) heart failure with preserved ejection fraction ([place])   ? a. 2015 Echo: Nl EF. Mild to mod LVH; b. 04/2017 Echo: EF 55-60% Gr1 DD; c. 12/2018 Echo: EF 55-60%; d. 02/2020 Echo: EF 50-55%; e. 10/2020 Echo: EF 55-60%, mild LVH, mildly enlarged RV, sev elev RVSP, BAE, mild MR; 12/2020 Echo: EF 65%, sev dil RA, dil LA, RVSP 29.60mHg, MR/PR.  ? Abdominal ascites   ? Acute hypoxemic respiratory failure ([place] 01/03/2019  ? Acute renal failure (ARF) ([facility] 05/17/2017  ? Acute respiratory failure ([place] 09/25/2018  ? Anemia   ? Anxiety   ? Arthritis   ? Asthma   ? Atrial flutter ([place] [place]   ? a. Dx 03/2022-->CHA2DS2VASc = 3-->eliquis.  ? Cardiomegaly   ? COPD (chronic obstructive pulmonary disease) ([facility]   ? Depression   ? Dialysis patient ([facility]   ? MON, WED , FRI  ? Dyspnea   ? ESRD (end stage renal disease) ([facility]   ? GERD (gastroesophageal reflux disease)   ? Headache   ? Migraines  ? Hypertension   ? Nonrheumatic tricuspid (valve) insufficiency   ? Pericardial effusion 01/26/2019  ? Respiratory failure with hypoxia ([place] 04/21/2018  ? Ulcer   ? gastric  ? Vitamin D deficiency   ? ?Past Surgical History:  ?Procedure Laterality Date  ? A/V FISTULAGRAM Left 04/17/2018  ? Procedure: A/V FISTULAGRAM;  Surgeon: DAlgernon Huxley MD;  [place];  Service: Cardiovascular;  Laterality: Left;  ? A/V FISTULAGRAM Left 05/27/2018  ? Procedure: A/V FISTULAGRAM;  Surgeon: SKatha Cabal MD;  [place] [facility];  Service: Cardiovascular;   Laterality: Left;  ? A/V FISTULAGRAM Left 07/05/2020  ? Procedure: A/V FISTULAGRAM;  Surgeon: SKatha Cabal MD;  [place] [facility];  Service: Cardiovascular;  Laterality: Left;  ? AV FISTULA PLACEMENT Left 08/08/2017  ? Procedure: ARTERIOVENOUS (AV) FISTULA CREATION ( BRACHIOCEPHALIC );  Surgeon: DAlgernon Huxley MD;  Location: [facility];  Service: Vascular;  Laterality: Left;  ? AV FISTULA PLACEMENT Left 08/05/2020  ? Procedure: INSERTION OF ARTERIOVENOUS (AV) GRAFT ARM(BRACHIAL AXILLARY);  Surgeon: SKatha Cabal MD;  Location: [facility];  Service: Vascular;  Laterality: Left;  ? CESAREAN SECTION    ? x 4 (1988, 1991, 1997, 2000 )  ? COLONOSCOPY WITH PROPOFOL N/A 04/24/2018  ? Procedure: COLONOSCOPY WITH PROPOFOL;  Surgeon: VLin Landsman MD;  [facility];  Service: Gastroenterology;  Laterality: N/A;  ? DIALYSIS/PERMA CATHETER INSERTION N/A 07/16/2017  ? Procedure: Dialysis/Perma Catheter Insertion;  Surgeon: SKatha Cabal MD;  [place];  Service: Cardiovascular;  Laterality: N/A;  ? DIALYSIS/PERMA CATHETER REMOVAL N/A 10/29/2017  ? Procedure: DIALYSIS/PERMA CATHETER REMOVAL;  Surgeon: SKatha Cabal MD;  [place];  Service: Cardiovascular;  Laterality: N/A;  ? DIALYSIS/PERMA CATHETER REMOVAL N/A 11/03/2020  ? Procedure: DIALYSIS/PERMA CATHETER REMOVAL;  Surgeon: DAlgernon Huxley MD;  [facility];  Service: Cardiovascular;  Laterality: N/A;  ? excision of  lymph nodes Bilateral 2014  ? bilateral under arms.  ? PARATHYROID IMPLANT REMOVAL    ? REMOVAL OF A DIALYSIS CATHETER Left 08/05/2020  ? Procedure: REMOVAL OF A DIALYSIS CATHETER ( FISTULA );  Surgeon: Katha Cabal, MD;  Location: [facility];  Service: Vascular;  Laterality: Left;  ? SHOULDER ARTHROSCOPY WITH SUBACROMIAL DECOMPRESSION AND OPEN ROTATOR C Right 01/08/2022  ? Procedure: Right shoulder arthroscopic subscapularis repair, rotator cuff repair,  subacromial decompression, and distal clavicle excision;  Surgeon: Leim Fabry, MD;  Location: [facility];  Service: Orthopedics;  Laterality: Right;  ? ?Allergies ? ?No Known Allergies ? ?History of Present Illness  ?  ?52 year old female with above complex past medical history including HFpEF, end-stage renal disease on hemodialysis, ongoing tobacco abuse, COPD, hypertension, GERD, and anemia of chronic disease.  She was initially evaluated in January 2019 out of concern for abnormal echocardiogram and diastolic dysfunction.  At that time, was felt that if she was having issues with volume, her dry weight may need to be lowered.  Repeat echo in January 2020, again showed normal LV function with an EF of 55 to 60% with moderately elevated pulmonary pressures.  She has since had repeat echos was in 02/2020, 10/2020, and most recently 12/2020, all with low normal to normal LV function, with an EF of 65% on most recent echo, with severely dilated right atrium, and RVSP of 29.9 mmHg.  She has a known history of mild mitral regurgitation as well. ? ?Ms. Buser was last seen in cardiology clinic in August 2022, at which time she was noted to be feeling well and tolerating dialysis.  She was recently admitted to [facility] regional between April 12 and April 14 in the setting of altered mental status and acute uremia, after skipping dialysis for a week.  In the ED, she was initially minimally responsive, and she was admitted for uremic encephalopathy and hyperkalemia with emergent hemodialysis on April 12.  Of note, admission ECG showed rate controlled 2-1 atrial flutter at 94 bpm.  She was not on a cardiac monitor during admission, and there is no mention of atrial flutter on progress notes or discharge summary, so it is not clear if this was persistent throughout hospitalization but her heart rates were noted to be elevated in the low 100s on vital signs recordings.  Her metoprolol dose was increased during her  hospitalization of 75 mg twice daily.  She says she believes somebody might of mention something about starting a blood thinner but this was not started during the hospitalization.  Since discharge, she has had significant ongoing fatigue and dyspnea on exertion.  She also notes tachypalpitations all day long.  Blood pressures have been stable in the 120-140 range, and she does not typically drop too low during dialysis.  She says that during dialysis sessions, tachycardia has been noted throughout.  She recently saw her primary care provider and believes that she underwent an echocardiogram but is not sure.  She did recently have labs which are outlined below.  She is interested in pursuing TEE and cardioversion and does not think that she can wait 3 to 4 weeks to have this done because of feeling so poorly.  She denies chest pain, PND, orthopnea, dizziness, syncope, edema, or early satiety. ? ?Home Medications  ?  ?Current Outpatient Medications  ?Medication Sig Dispense Refill  ? acetaminophen (TYLENOL) 500 MG tablet Take 2 tablets (1,000 mg total) by mouth every 8 (eight) hours. 90 tablet 2  ?  albuterol (PROVENTIL) (2.5 MG/3ML) 0.083% nebulizer solution Take 2.5 mg by nebulization every 6 (six) hours as needed for wheezing or shortness of breath.     ? albuterol (VENTOLIN HFA) 108 (90 Base) MCG/ACT inhaler Inhale 2 puffs into the lungs every 6 (six) hours as needed for wheezing or shortness of breath.     ? amLODipine (NORVASC) 5 MG tablet Take 1 tablet (5 mg total) by mouth daily. 90 tablet 3  ? apixaban (ELIQUIS) 5 MG TABS tablet Take 1 tablet (5 mg total) by mouth 2 (two) times daily. 60 tablet 11  ? calcium acetate (PHOSLO) 667 MG capsule Take 2 capsules (1,334 mg total) by mouth 3 (three) times daily with meals. 60 capsule 0  ? Cholecalciferol (VITAMIN D3) 125 MCG (5000 UT) CAPS Take 5,000 Units by mouth once a week.    ? cinacalcet (SENSIPAR) 30 MG tablet Take 1 tablet (30 mg total) by mouth every Monday,  Wednesday, and Friday. 60 tablet 0  ? DULERA 200-5 MCG/ACT AERO Inhale 2 puffs into the lungs 2 (two) times daily.  0  ? hydrOXYzine (ATARAX/VISTARIL) 25 MG tablet Take 25 mg by mouth in the morning.    ? lidocaine-prilocai

## 2022-04-27 NOTE — Progress Notes (Signed)
? ? ?Office Visit  ?  ?Patient Name: Janet Olson ?Date of Encounter: 04/27/2022 ? ?Primary Care Provider:  Remi Haggard, FNP ?Primary Cardiologist:  Janet Rogue, Olson ? ?Chief Complaint  ?  ?52 year old female with a history of HFpEF, end-stage renal disease on hemodialysis, ongoing tobacco abuse, COPD, hypertension, GERD, and anemia, who presents for follow-up related to rapid atrial flutter. ? ?Past Medical History  ?  ?Past Medical History:  ?Diagnosis Date  ? (HFpEF) heart failure with preserved ejection fraction ([place])   ? a. 2015 Echo: Nl EF. Mild to mod LVH; b. 04/2017 Echo: EF 55-60% Gr1 DD; c. 12/2018 Echo: EF 55-60%; Janet. 02/2020 Echo: EF 50-55%; e. 10/2020 Echo: EF 55-60%, mild LVH, mildly enlarged RV, sev elev RVSP, BAE, mild MR; 12/2020 Echo: EF 65%, sev dil RA, dil LA, RVSP 29.24mHg, MR/PR.  ? Abdominal ascites   ? Acute hypoxemic respiratory failure ([place] 01/03/2019  ? Acute renal failure (ARF) ([facility] 05/17/2017  ? Acute respiratory failure ([facility] 09/25/2018  ? Anemia   ? Anxiety   ? Arthritis   ? Asthma   ? Atrial flutter ([facility]   ? a. Dx 03/2022-->CHA2DS2VASc = 3-->eliquis.  ? Cardiomegaly   ? COPD (chronic obstructive pulmonary disease) ([place]   ? Depression   ? Dialysis patient ([facility]   ? MON, WED , FRI  ? Dyspnea   ? ESRD (end stage renal disease) ([facility]   ? GERD (gastroesophageal reflux disease)   ? Headache   ? Migraines  ? Hypertension   ? Nonrheumatic tricuspid (valve) insufficiency   ? Pericardial effusion 01/26/2019  ? Respiratory failure with hypoxia (HL[place] 04/21/2018  ? Ulcer   ? gastric  ? Vitamin Janet deficiency   ? ?Past Surgical History:  ?Procedure Laterality Date  ? A/Janet FISTULAGRAM Left 04/17/2018  ? Procedure: A/Janet FISTULAGRAM;  Surgeon: DAlgernon Huxley Olson;  [place];  Service: Cardiovascular;  Laterality: Left;  ? A/Janet FISTULAGRAM Left 05/27/2018  ? Procedure: A/Janet FISTULAGRAM;  Surgeon: Janet Olson;  [facility] [place];  Service: Cardiovascular;   Laterality: Left;  ? A/Janet FISTULAGRAM Left 07/05/2020  ? Procedure: A/Janet FISTULAGRAM;  Surgeon: Janet Olson;  [place] [facility];  Service: Cardiovascular;  Laterality: Left;  ? AV FISTULA PLACEMENT Left 08/08/2017  ? Procedure: ARTERIOVENOUS (AV) FISTULA CREATION ( BRACHIOCEPHALIC );  Surgeon: DAlgernon Huxley Olson;  Location: [facility];  Service: Vascular;  Laterality: Left;  ? AV FISTULA PLACEMENT Left 08/05/2020  ? Procedure: INSERTION OF ARTERIOVENOUS (AV) GRAFT ARM(BRACHIAL AXILLARY);  Surgeon: Janet Olson;  Location: [facility];  Service: Vascular;  Laterality: Left;  ? CESAREAN SECTION    ? x 4 (1988, 1991, 1997, 2000 )  ? COLONOSCOPY WITH PROPOFOL N/A 04/24/2018  ? Procedure: COLONOSCOPY WITH PROPOFOL;  Surgeon: VLin Landsman Olson;  [facility];  Service: Gastroenterology;  Laterality: N/A;  ? DIALYSIS/PERMA CATHETER INSERTION N/A 07/16/2017  ? Procedure: Dialysis/Perma Catheter Insertion;  Surgeon: Janet Olson;  [place];  Service: Cardiovascular;  Laterality: N/A;  ? DIALYSIS/PERMA CATHETER REMOVAL N/A 10/29/2017  ? Procedure: DIALYSIS/PERMA CATHETER REMOVAL;  Surgeon: Janet Olson;  [place];  Service: Cardiovascular;  Laterality: N/A;  ? DIALYSIS/PERMA CATHETER REMOVAL N/A 11/03/2020  ? Procedure: DIALYSIS/PERMA CATHETER REMOVAL;  Surgeon: DAlgernon Huxley Olson;  [place];  Service: Cardiovascular;  Laterality: N/A;  ? excision of  lymph nodes Bilateral 2014  ? bilateral under arms.  ? PARATHYROID IMPLANT REMOVAL    ? REMOVAL OF A DIALYSIS CATHETER Left 08/05/2020  ? Procedure: REMOVAL OF A DIALYSIS CATHETER ( FISTULA );  Surgeon: Janet Olson;  Location: [facility];  Service: Vascular;  Laterality: Left;  ? SHOULDER ARTHROSCOPY WITH SUBACROMIAL DECOMPRESSION AND OPEN ROTATOR C Right 01/08/2022  ? Procedure: Right shoulder arthroscopic subscapularis repair, rotator cuff repair,  subacromial decompression, and distal clavicle excision;  Surgeon: Janet Olson;  Location: [facility];  Service: Orthopedics;  Laterality: Right;  ? ?Allergies ? ?No Known Allergies ? ?History of Present Illness  ?  ?52 year old female with above complex past medical history including HFpEF, end-stage renal disease on hemodialysis, ongoing tobacco abuse, COPD, hypertension, GERD, and anemia of chronic disease.  She was initially evaluated in January 2019 out of concern for abnormal echocardiogram and diastolic dysfunction.  At that time, was felt that if she was having issues with volume, her dry weight may need to be lowered.  Repeat echo in January 2020, again showed normal LV function with an EF of 55 to 60% with moderately elevated pulmonary pressures.  She has since had repeat echos was in 02/2020, 10/2020, and most recently 12/2020, all with low normal to normal LV function, with an EF of 65% on most recent echo, with severely dilated right atrium, and RVSP of 29.9 mmHg.  She has a known history of mild mitral regurgitation as well. ? ?[name] was last seen in cardiology clinic in August 2022, at which time she was noted to be feeling well and tolerating dialysis.  She was recently admitted to [facility] regional between April 12 and April 14 in the setting of altered mental status and acute uremia, after skipping dialysis for a week.  In the ED, she was initially minimally responsive, and she was admitted for uremic encephalopathy and hyperkalemia with emergent hemodialysis on April 12.  Of note, admission ECG showed rate controlled 2-1 atrial flutter at 94 bpm.  She was not on a cardiac monitor during admission, and there is no mention of atrial flutter on progress notes or discharge summary, so it is not clear if this was persistent throughout hospitalization but her heart rates were noted to be elevated in the low 100s on vital signs recordings.  Her metoprolol dose was increased during her  hospitalization of 75 mg twice daily.  She says she believes somebody might of mention something about starting a blood thinner but this was not started during the hospitalization.  Since discharge, she has had significant ongoing fatigue and dyspnea on exertion.  She also notes tachypalpitations all day long.  Blood pressures have been stable in the 120-140 range, and she does not typically drop too low during dialysis.  She says that during dialysis sessions, tachycardia has been noted throughout.  She recently saw her primary care provider and believes that she underwent an echocardiogram but is not sure.  She did recently have labs which are outlined below.  She is interested in pursuing TEE and cardioversion and does not think that she can wait 3 to 4 weeks to have this done because of feeling so poorly.  She denies chest pain, PND, orthopnea, dizziness, syncope, edema, or early satiety. ? ?Home Medications  ?  ?Current Outpatient Medications  ?Medication Sig Dispense Refill  ? acetaminophen (TYLENOL) 500 MG tablet Take 2 tablets (1,000 mg total) by mouth every 8 (eight) hours. 90 tablet 2  ?  albuterol (PROVENTIL) (2.5 MG/3ML) 0.083% nebulizer solution Take 2.5 mg by nebulization every 6 (six) hours as needed for wheezing or shortness of breath.     ? albuterol (VENTOLIN HFA) 108 (90 Base) MCG/ACT inhaler Inhale 2 puffs into the lungs every 6 (six) hours as needed for wheezing or shortness of breath.     ? amLODipine (NORVASC) 5 MG tablet Take 1 tablet (5 mg total) by mouth daily. 90 tablet 3  ? apixaban (ELIQUIS) 5 MG TABS tablet Take 1 tablet (5 mg total) by mouth 2 (two) times daily. 60 tablet 11  ? calcium acetate (PHOSLO) 667 MG capsule Take 2 capsules (1,334 mg total) by mouth 3 (three) times daily with meals. 60 capsule 0  ? Cholecalciferol (VITAMIN D3) 125 MCG (5000 UT) CAPS Take 5,000 Units by mouth once a week.    ? cinacalcet (SENSIPAR) 30 MG tablet Take 1 tablet (30 mg total) by mouth every Monday,  Wednesday, and Friday. 60 tablet 0  ? DULERA 200-5 MCG/ACT AERO Inhale 2 puffs into the lungs 2 (two) times daily.  0  ? hydrOXYzine (ATARAX/VISTARIL) 25 MG tablet Take 25 mg by mouth in the morning.    ? lidocaine-prilocai

## 2022-04-30 NOTE — Anesthesia Preprocedure Evaluation (Addendum)
Anesthesia Evaluation  ?Patient identified by MRN, date of birth, ID band ?Patient awake ? ? ? ?Reviewed: ?Allergy & Precautions, NPO status , Patient's Chart, lab work & pertinent test results, reviewed documented beta blocker date and time  ? ?History of Anesthesia Complications ?Negative for: history of anesthetic complications ? ?Airway ?Mallampati: III ? ?TM Distance: >3 FB ?Neck ROM: Full ? ? ? Dental ? ?(+) Poor Dentition, Missing,  ?  ?Pulmonary ?shortness of breath, asthma , neg sleep apnea, COPD,  COPD inhaler, Current SmokerPatient did not abstain from smoking.,  ?  ?- rhonchi ?+ wheezing ? ? ? ? ? Cardiovascular ?Exercise Tolerance: Poor ?METS: < 3 Mets hypertension, Pt. on medications and Pt. on home beta blockers ?+CHF  ?(-) CAD, (-) Past MI, (-) Cardiac Stents and (-) CABG + dysrhythmias Atrial Fibrillation  ?Rhythm:Regular Rate:Normal ?- Systolic murmurs and - Diastolic murmurs ? ?  ?Neuro/Psych ? Headaches, neg Seizures PSYCHIATRIC DISORDERS Anxiety Depression   ? GI/Hepatic ?Neg liver ROS, GERD  Controlled,Ascites during hospitalization in November 2021 ?  ?Endo/Other  ?negative endocrine ROSneg diabetes ? Renal/GU ?ESRF and DialysisRenal disease  ? ?  ?Musculoskeletal ? ?(+) Arthritis ,  ? Abdominal ?(+) + obese,   ?Peds ? Hematology ? ?(+) Blood dyscrasia, anemia ,   ?Anesthesia Other Findings ?Complete tear of right rotator cuff, unspecified whether traumatic   ? ?ECHO 2020: ?? Left ventricular hypertrophy - mild  ?? Normal left ventricular systolic function, ejection fraction 55 to 60%  ?? Degenerative mitral valve disease  ?? Dilated left atrium - mild  ?? Dilated pulmonary artery - mild  ?? Dilated right ventricle - mild to moderate  ?? Mildly decreased right ventricular systolic function  ?? Tricuspid regurgitation - mild  ?? Elevated pulmonary artery systolic pressure - moderate  ?? Dilated right atrium - moderate  ?? Elevated right atrial pressure ? ?  Reproductive/Obstetrics ? ?  ? ? ? ? ? ? ? ? ? ? ? ? ? ?  ?  ? ? ? ? ? ? ? ? ?Anesthesia Physical ? ?Anesthesia Plan ? ?ASA: 3 ? ?Anesthesia Plan: General  ? ?Post-op Pain Management: Minimal or no pain anticipated  ? ?Induction: Intravenous ? ?PONV Risk Score and Plan: 1 and Treatment may vary due to age or medical condition ? ?Airway Management Planned: Simple Face Mask and Natural Airway ? ?Additional Equipment:  ? ?Intra-op Plan:  ? ?Post-operative Plan:  ? ?Informed Consent: I have reviewed the patients History and Physical, chart, labs and discussed the procedure including the risks, benefits and alternatives for the proposed anesthesia with the patient or authorized representative who has indicated his/her understanding and acceptance.  ? ? ? ?Dental advisory given ? ?Plan Discussed with: CRNA and Anesthesiologist ? ?Anesthesia Plan Comments:   ? ? ? ? ? ?Anesthesia Quick Evaluation ? ?

## 2022-05-01 ENCOUNTER — Ambulatory Visit (HOSPITAL_BASED_OUTPATIENT_CLINIC_OR_DEPARTMENT_OTHER)
Admission: RE | Admit: 2022-05-01 | Discharge: 2022-05-01 | Disposition: A | Discharge status: Home / Self Care | Payer: Medicare Other | Source: Home / Self Care | Attending: Nurse Practitioner | Admitting: Nurse Practitioner

## 2022-05-01 ENCOUNTER — Ambulatory Visit: Payer: Medicare Other | Admitting: Anesthesiology

## 2022-05-01 ENCOUNTER — Encounter
Admission: RE | Disposition: A | Discharge status: Home / Self Care | Payer: Self-pay | Source: Home / Self Care | Attending: Cardiovascular Disease

## 2022-05-01 ENCOUNTER — Encounter: Payer: Self-pay | Admitting: Cardiovascular Disease

## 2022-05-01 ENCOUNTER — Ambulatory Visit
Admission: RE | Admit: 2022-05-01 | Discharge: 2022-05-01 | Disposition: A | Discharge status: Home / Self Care | Payer: Medicare Other | Attending: Cardiovascular Disease | Admitting: Cardiovascular Disease

## 2022-05-01 DIAGNOSIS — F419 Anxiety disorder, unspecified: Secondary | ICD-10-CM | POA: Diagnosis not present

## 2022-05-01 DIAGNOSIS — I4892 Unspecified atrial flutter: Secondary | ICD-10-CM | POA: Diagnosis not present

## 2022-05-01 DIAGNOSIS — I361 Nonrheumatic tricuspid (valve) insufficiency: Secondary | ICD-10-CM | POA: Diagnosis not present

## 2022-05-01 DIAGNOSIS — K219 Gastro-esophageal reflux disease without esophagitis: Secondary | ICD-10-CM | POA: Diagnosis not present

## 2022-05-01 DIAGNOSIS — I483 Typical atrial flutter: Secondary | ICD-10-CM | POA: Diagnosis not present

## 2022-05-01 DIAGNOSIS — I340 Nonrheumatic mitral (valve) insufficiency: Secondary | ICD-10-CM | POA: Diagnosis not present

## 2022-05-01 DIAGNOSIS — I5032 Chronic diastolic (congestive) heart failure: Secondary | ICD-10-CM | POA: Diagnosis not present

## 2022-05-01 DIAGNOSIS — I132 Hypertensive heart and chronic kidney disease with heart failure and with stage 5 chronic kidney disease, or end stage renal disease: Secondary | ICD-10-CM | POA: Insufficient documentation

## 2022-05-01 DIAGNOSIS — F32A Depression, unspecified: Secondary | ICD-10-CM | POA: Diagnosis not present

## 2022-05-01 DIAGNOSIS — Z79899 Other long term (current) drug therapy: Secondary | ICD-10-CM | POA: Insufficient documentation

## 2022-05-01 DIAGNOSIS — Z992 Dependence on renal dialysis: Secondary | ICD-10-CM | POA: Diagnosis not present

## 2022-05-01 DIAGNOSIS — G4730 Sleep apnea, unspecified: Secondary | ICD-10-CM | POA: Insufficient documentation

## 2022-05-01 DIAGNOSIS — N186 End stage renal disease: Secondary | ICD-10-CM | POA: Diagnosis not present

## 2022-05-01 DIAGNOSIS — F17210 Nicotine dependence, cigarettes, uncomplicated: Secondary | ICD-10-CM | POA: Diagnosis not present

## 2022-05-01 DIAGNOSIS — J449 Chronic obstructive pulmonary disease, unspecified: Secondary | ICD-10-CM | POA: Diagnosis not present

## 2022-05-01 HISTORY — PX: CARDIOVERSION: SHX1299

## 2022-05-01 HISTORY — PX: TEE WITHOUT CARDIOVERSION: SHX5443

## 2022-05-01 SURGERY — CARDIOVERSION
Anesthesia: General

## 2022-05-01 MED ORDER — PROPOFOL 10 MG/ML IV BOLUS
INTRAVENOUS | Status: DC | PRN
  Administered 2022-05-01: 20 mg via INTRAVENOUS
  Administered 2022-05-01: 30 mg via INTRAVENOUS
  Administered 2022-05-01: 20 mg via INTRAVENOUS
  Administered 2022-05-01: 30 mg via INTRAVENOUS
  Administered 2022-05-01: 50 mg via INTRAVENOUS

## 2022-05-01 MED ORDER — BUTAMBEN-TETRACAINE-BENZOCAINE 2-2-14 % EX AERO
INHALATION_SPRAY | CUTANEOUS | Status: AC
  Administered 2022-05-01: 3
  Filled 2022-05-01: qty 5

## 2022-05-01 MED ORDER — PROPOFOL 500 MG/50ML IV EMUL
INTRAVENOUS | Status: AC
  Filled 2022-05-01: qty 100

## 2022-05-01 MED ORDER — SODIUM CHLORIDE 0.9 % IV SOLN: INTRAVENOUS | Status: DC

## 2022-05-01 MED ORDER — LIDOCAINE VISCOUS HCL 2 % MT SOLN
OROMUCOSAL | Status: AC
Start: 2022-05-01 — End: 2022-05-01
  Administered 2022-05-01: 15 mL
  Filled 2022-05-01: qty 15

## 2022-05-01 NOTE — H&P (Signed)
H&P Addendum, pre-transesophageal echo and cardioversion ? ?Patient was seen and evaluated prior to -transesophageal echo and cardioversion procedure ?Symptoms, prior testing details again confirmed with the patient ?Patient examined, no significant change from prior exam ?Lab work reviewed in detail personally by myself ?Patient understands risk and benefit of the procedure,  ?The risks (stroke, cardiac arrhythmias rarely resulting in the need for a temporary or permanent pacemaker, skin irritation or burns and complications associated with conscious sedation including aspiration, arrhythmia, respiratory failure and death), benefits (restoration of normal sinus rhythm) and alternatives of a direct current cardioversion were explained in detail ?Patient willing to proceed. ? ?Signed, ?Esmond Plants, MD, Ph.D ?[facility]  ?

## 2022-05-01 NOTE — CV Procedure (Signed)
Cardioversion procedure note ?For atrial flutter with RVR ? ?Procedure Details: ? ?Consent: Risks of procedure as well as the alternatives and risks of each were explained to the (patient/caregiver).  Consent for procedure obtained. ? ?Time Out: Verified patient identification, verified procedure, site/side was marked, verified correct patient position, special equipment/implants available, medications/allergies/relevent history reviewed, required imaging and test results available.  Performed ? ?Patient placed on cardiac monitor, pulse oximetry, supplemental oxygen as necessary.   ?Sedation given: propofol IV, Dr. Wynetta Emery ?Pacer pads placed anterior and posterior chest. ? ? ?Cardioverted 1 time(s).   ?Cardioverted at  150 J. Synchronized biphasic ?Converted to NSR ? ? ?Evaluation: ?Findings: Post procedure EKG shows: NSR ?Complications: None ?Patient did tolerate procedure well. ? ?Time Spent Directly with the Patient: ? ?45 minutes  ? ?Esmond Plants, M.D., Ph.D. ? ?

## 2022-05-01 NOTE — Progress Notes (Signed)
*  PRELIMINARY RESULTS* ?Echocardiogram ?2D Echocardiogram has been performed. ? ?Yoali Conry, Sonia Side ?05/01/2022, 8:35 AM ?

## 2022-05-01 NOTE — Interval H&P Note (Signed)
History and Physical Interval Note: ? ?05/01/2022 ?9:52 AM ? ?Janet Olson  has presented today for surgery, with the diagnosis of Cardioversion and TEE   Afib  ?LABS ARE SCANNED UNDER MEDIA TAB.  The various methods of treatment have been discussed with the patient and family. After consideration of risks, benefits and other options for treatment, the patient has consented to  Procedure(s): ?CARDIOVERSION (N/A) ?TRANSESOPHAGEAL ECHOCARDIOGRAM (TEE) (N/A) as a surgical intervention.  The patient's history has been reviewed, patient examined, no change in status, stable for surgery.  I have reviewed the patient's chart and labs.  Questions were answered to the patient's satisfaction.   ? ? ?Ida Rogue ? ? ?

## 2022-05-01 NOTE — Progress Notes (Addendum)
Transesophageal Echocardiogram : ? ?Indication: Atrial flutter ?Requesting/ordering  physician: Latricia Heft ? ?Procedure: Benzocaine spray x2 and 2 mls x 2 of viscous lidocaine were given orally to provide local anesthesia to the oropharynx. The patient was positioned supine on the left side, bite block provided. The patient was moderately sedated with the doses of versed and fentanyl as detailed below.  Using digital technique an omniplane probe was advanced into the distal esophagus without incident.  ? ?Moderate sedation: ?1. Sedation used: Propofol provided by anesthesia ? ?See report in EPIC  for complete details: ?In brief, transgastric imaging revealed mildly decreased LV function with mild global hypokinesis and no mural apical thrombus.  .  Estimated ejection fraction was 40%.  Right sided cardiac chambers with moderately depressed function ? ?Mild to moderate TR, mild MR ? ?Imaging of the septum showed no ASD or VSD ?Bubble study was negative for shunt ?2D and color flow confirmed no PFO ? ?The LA was well visualized in orthogonal views.  There was no spontaneous contrast and no thrombus in the LA and LA appendage  ? ?The descending thoracic aorta had no  mural aortic debris with no evidence of aneurysmal dilation or disection.  Mild to moderate aortic atherosclerosis in the arch ? ?Cardioversion to follow ?No complications ?Moderate wheezing and respiratory distress managed by anesthesia ? ? ?Janet Olson ?05/01/2022 ?8:39 AM  ?

## 2022-05-01 NOTE — Transfer of Care (Signed)
Immediate Anesthesia Transfer of Care Note ? ?Patient: Janet Olson ? ?Procedure(s) Performed: CARDIOVERSION ?TRANSESOPHAGEAL ECHOCARDIOGRAM (TEE) ? ?Patient Location: Short Stay ? ?Anesthesia Type:General ? ?Level of Consciousness: awake ? ?Airway & Oxygen Therapy: Patient Spontanous Breathing and Patient connected to nasal cannula oxygen ? ?Post-op Assessment: Report given to RN and Post -op Vital signs reviewed and stable ? ?Post vital signs: Reviewed and stable ? ?Last Vitals:  ?Vitals Value Taken Time  ?BP 106/69 05/01/22 0827  ?Temp    ?Pulse 80 05/01/22 0829  ?Resp 27 05/01/22 0830  ?SpO2 99 % 05/01/22 0829  ?Vitals shown include unvalidated device data. ? ?Last Pain:  ?Vitals:  ? 05/01/22 0716  ?PainSc: 5   ?   ? ?  ? ?Complications: No notable events documented. ?

## 2022-05-02 ENCOUNTER — Encounter: Payer: Self-pay | Admitting: Cardiovascular Disease

## 2022-05-02 NOTE — Anesthesia Postprocedure Evaluation (Signed)
Anesthesia Post Note ? ?Patient: Janet Olson ? ?Procedure(s) Performed: CARDIOVERSION ?TRANSESOPHAGEAL ECHOCARDIOGRAM (TEE) ? ?Patient location during evaluation: PACU ?Anesthesia Type: General ?Level of consciousness: awake and alert ?Pain management: pain level controlled ?Vital Signs Assessment: post-procedure vital signs reviewed and stable ?Respiratory status: spontaneous breathing, nonlabored ventilation and respiratory function stable ?Cardiovascular status: blood pressure returned to baseline and stable ?Postop Assessment: no apparent nausea or vomiting ?Anesthetic complications: no ? ? ?No notable events documented. ? ? ?Last Vitals:  ?Vitals:  ? 05/01/22 0828 05/01/22 0829  ?BP:    ?Pulse: 81 80  ?Resp: (!) 27 (!) 21  ?Temp:    ?SpO2: 95% 99%  ?  ?Last Pain:  ?Vitals:  ? 05/01/22 0716  ?PainSc: 5   ? ? ?  ?  ?  ?  ?  ?  ? ?[place] Ouch ? ? ? ? ?

## 2022-05-07 ENCOUNTER — Other Ambulatory Visit: Payer: Self-pay | Admitting: Nephrology

## 2022-05-07 DIAGNOSIS — R188 Other ascites: Secondary | ICD-10-CM

## 2022-05-08 ENCOUNTER — Ambulatory Visit
Admission: RE | Admit: 2022-05-08 | Discharge: 2022-05-08 | Disposition: A | Discharge status: Home / Self Care | Payer: Medicare Other | Source: Ambulatory Visit | Attending: Nephrology | Admitting: Nephrology

## 2022-05-08 DIAGNOSIS — R188 Other ascites: Secondary | ICD-10-CM | POA: Diagnosis present

## 2022-05-16 ENCOUNTER — Ambulatory Visit
Admission: RE | Admit: 2022-05-16 | Discharge: 2022-05-16 | Disposition: A | Discharge status: Home / Self Care | Payer: Medicare Other | Source: Ambulatory Visit | Attending: Family Medicine | Admitting: Family Medicine

## 2022-05-16 DIAGNOSIS — Z1231 Encounter for screening mammogram for malignant neoplasm of breast: Secondary | ICD-10-CM | POA: Diagnosis present

## 2022-05-22 NOTE — Progress Notes (Unsigned)
Cardiology Office Note  Date:  05/23/2022   ID:  Janet Olson, DOB 1970/04/04, MRN [id number]  PCP:  Remi Haggard, FNP   Chief Complaint  Patient presents with   3 week follow up     Patient c/o bilateral LE edema & shortness of breath. Medications reviewed by the patient verbally.     HPI:  Janet Olson is a 52 year old woman with past medical history of ESRD On HD mon/wed/Fri for 4 years COPD HTN/renal dz Anemia of chronic dz Asthma Smoker Diabetes  Left ventricular ejection fraction, by estimation, is 40 to 45%. Who presents for f/u of her  pulmopnary edema, atrial flutter, cardiomyopathy  LOV 8/22  admitted to [facility] between April 12 and April 13 2022 in the setting of altered mental status and acute uremia, after skipping dialysis for a week.  In the ED, she was initially minimally responsive, and she was admitted for uremic encephalopathy and hyperkalemia with emergent hemodialysis on April 12.   admission ECG showed rate controlled 2-1 atrial flutter at 94 bpm.    Her metoprolol dose was increased during her hospitalization of 75 mg twice daily.  Seen in the office in follow-up and cardioversion was scheduled cardioversion May 01, 2022 for atrial flutter Normal sinus rhythm restored  She remains in normal sinus rhythm on today's visit, denies any tachypalpitations concerning for arrhythmia This week had HD sat, Monday, wed,   weight higher, more swelling, normal 3:45 hr, stopping early Fistula working slow, "main sticker having trouble", "has knots" Has appt with vascular June 1st to review fistula  Takes torsemide 100 mg daily, makes  Lots of ice intake , large bag a week Followed by Dr. Holley Raring Some SOB, has albuterol nebs  Still smoking Leg swelling worse on right than left  Paracentesis 2 weeks ago,05/08/2022 Feels that it is coming back as dialysis has been more troublesome   EKG personally reviewed by myself on todays visit Normal sinus  rhythm rate 74 bpm nonspecific T wave abnormality  Prior hospitalization records reviewed Hospital 10/2020 paracentesis with 3.2 L fluid removal.   Blood culture returned positive for Enterococcus bacteremia.   started on IV ampicillin.   Tunneled hemodialysis catheter removed on 11/4.   echocardiogram which was negative for endocarditis.   TEE was tentatively scheduled for 11/8. She declined the study, left AMA  Echo 12/2020 done with primary care, normal LV function Unable to review images  Could not tolerate coreg, upset stomach  hospitalized 1/21, ascites requiring paracentesis with removal of 2 L fluid.  Patient reports that she has missed several recent dialysis (3 in total) as she was feeling sick.  Hospital 02/2020,  Secondary to volume overload after missing several recent dialysis sessions. paracentesis with 7.5 L clear ascites fluid removed  Echo 02/2020 results reviewed with her  1. Left ventricular ejection fraction, by estimation, is 50 to 55%. The  left ventricle has low normal function. The left ventricle has no regional  wall motion abnormalities. There is moderate left ventricular hypertrophy.  -- interventricular septum is  flattened in diastole ('D' shaped left ventricle), consistent with right  ventricular volume overload.   --Right ventricular systolic function is moderately reduced. The right  ventricular size is mildly enlarged. There is moderately elevated  pulmonary artery systolic pressure.  --Acites is present.. The pericardial effusion is posterior to the left  ventricle.   Moderately dilated pulmonary artery.    The inferior vena cava is dilated  right atrial pressure  of 15 mmHg.    PMH:   has a past medical history of (HFpEF) heart failure with preserved ejection fraction ([place]), Abdominal ascites, Acute hypoxemic respiratory failure ([place]) (01/03/2019), Acute renal failure (ARF) ([place]) (05/17/2017), Acute respiratory failure ([place]) (09/25/2018), Anemia,  Anxiety, Arthritis, Asthma, Atrial flutter ([place]), Cardiomegaly, COPD (chronic obstructive pulmonary disease) ([place]), Depression, Dialysis patient ([place]), Dyspnea, ESRD (end stage renal disease) ([place]), GERD (gastroesophageal reflux disease), Headache, Hypertension, Nonrheumatic tricuspid (valve) insufficiency, Pericardial effusion (01/26/2019), Respiratory failure with hypoxia ([place]) (04/21/2018), Ulcer, and Vitamin D deficiency.  PSH:    Past Surgical History:  Procedure Laterality Date   A/V FISTULAGRAM Left 04/17/2018   Procedure: A/V FISTULAGRAM;  Surgeon: Algernon Huxley, MD;  Location: [place] [facility];  Service: Cardiovascular;  Laterality: Left;   A/V FISTULAGRAM Left 05/27/2018   Procedure: A/V FISTULAGRAM;  Surgeon: Katha Cabal, MD;  Location: [place] [facility];  Service: Cardiovascular;  Laterality: Left;   A/V FISTULAGRAM Left 07/05/2020   Procedure: A/V FISTULAGRAM;  Surgeon: Katha Cabal, MD;  Location: [place] [facility];  Service: Cardiovascular;  Laterality: Left;   AV FISTULA PLACEMENT Left 08/08/2017   Procedure: ARTERIOVENOUS (AV) FISTULA CREATION ( BRACHIOCEPHALIC );  Surgeon: Algernon Huxley, MD;  Location: [facility];  Service: Vascular;  Laterality: Left;   AV FISTULA PLACEMENT Left 08/05/2020   Procedure: INSERTION OF ARTERIOVENOUS (AV) GRAFT ARM(BRACHIAL AXILLARY);  Surgeon: Katha Cabal, MD;  Location: [facility];  Service: Vascular;  Laterality: Left;   CARDIOVERSION N/A 05/01/2022   Procedure: CARDIOVERSION;  Surgeon: Minna Merritts, MD;  Location: [facility];  Service: Cardiovascular;  Laterality: N/A;   CESAREAN SECTION     x 4 (1988, 1991, 1997, 2000 )   COLONOSCOPY WITH PROPOFOL N/A 04/24/2018   Procedure: COLONOSCOPY WITH PROPOFOL;  Surgeon: Lin Landsman, MD;  Location: [facility];  Service: Gastroenterology;  Laterality: N/A;   DIALYSIS/PERMA CATHETER INSERTION N/A 07/16/2017   Procedure: Dialysis/Perma Catheter Insertion;  Surgeon: Katha Cabal, MD;  Location: [place] [facility];  Service: Cardiovascular;  Laterality: N/A;   DIALYSIS/PERMA CATHETER REMOVAL N/A 10/29/2017   Procedure: DIALYSIS/PERMA CATHETER REMOVAL;  Surgeon: Katha Cabal, MD;  Location: [place] [facility];  Service: Cardiovascular;  Laterality: N/A;   DIALYSIS/PERMA CATHETER REMOVAL N/A 11/03/2020   Procedure: DIALYSIS/PERMA CATHETER REMOVAL;  Surgeon: Algernon Huxley, MD;  Location: [place] [facility];  Service: Cardiovascular;  Laterality: N/A;   excision of lymph nodes Bilateral 2014   bilateral under arms.   PARATHYROID IMPLANT REMOVAL     REMOVAL OF A DIALYSIS CATHETER Left 08/05/2020   Procedure: REMOVAL OF A DIALYSIS CATHETER ( FISTULA );  Surgeon: Katha Cabal, MD;  Location: [facility];  Service: Vascular;  Laterality: Left;   SHOULDER ARTHROSCOPY WITH SUBACROMIAL DECOMPRESSION AND OPEN ROTATOR C Right 01/08/2022   Procedure: Right shoulder arthroscopic subscapularis repair, rotator cuff repair, subacromial decompression, and distal clavicle excision;  Surgeon: Leim Fabry, MD;  Location: [facility];  Service: Orthopedics;  Laterality: Right;   TEE WITHOUT CARDIOVERSION N/A 05/01/2022   Procedure: TRANSESOPHAGEAL ECHOCARDIOGRAM (TEE);  Surgeon: Minna Merritts, MD;  Location: [facility];  Service: Cardiovascular;  Laterality: N/A;    Current Outpatient Medications  Medication Sig Dispense Refill   acetaminophen (TYLENOL) 500 MG tablet Take 2 tablets (1,000 mg total) by mouth every 8 (eight) hours. 90 tablet 2   albuterol (PROVENTIL) (2.5 MG/3ML) 0.083% nebulizer solution Take 2.5 mg by nebulization every 6 (six)  hours as needed for wheezing or shortness of breath.      albuterol (VENTOLIN HFA) 108 (90 Base) MCG/ACT inhaler Inhale 2 puffs into the lungs every 6 (six) hours as needed for wheezing or shortness of breath.      amLODipine (NORVASC) 5 MG tablet Take 1 tablet (5 mg total) by mouth daily. 90 tablet 3   apixaban (ELIQUIS) 5 MG TABS  tablet Take 1 tablet (5 mg total) by mouth 2 (two) times daily. 60 tablet 11   benzonatate (TESSALON) 200 MG capsule Take 400 mg by mouth 3 (three) times daily as needed for cough.     calcium acetate (PHOSLO) 667 MG capsule Take 2 capsules (1,334 mg total) by mouth 3 (three) times daily with meals. 60 capsule 0   Cholecalciferol (DIALYVITE VITAMIN D 5000) 125 MCG (5000 UT) capsule Take 5,000 Units by mouth every Friday.     cinacalcet (SENSIPAR) 30 MG tablet Take 1 tablet (30 mg total) by mouth every Monday, Wednesday, and Friday. 60 tablet 0   DULERA 200-5 MCG/ACT AERO Inhale 2 puffs into the lungs 2 (two) times daily.  0   furosemide (LASIX) 40 MG tablet Take 40 mg by mouth daily.     gabapentin (NEURONTIN) 300 MG capsule Take 300 mg by mouth daily.     Heparin Sodium, Porcine, (HEPARIN LOCK FLUSH IJ) Inject 1 Dose as directed every Monday, Wednesday, and Friday. At dialysis     hydrOXYzine (ATARAX) 25 MG tablet Take by mouth.     hydrOXYzine (ATARAX/VISTARIL) 25 MG tablet Take 25 mg by mouth in the morning.     lidocaine-prilocaine (EMLA) cream 1 application as needed (prior to port being accessed).     linaclotide (LINZESS) 72 MCG capsule Take 72 mcg by mouth every evening.     lumateperone tosylate (CAPLYTA) 42 MG capsule Take 42 mg by mouth at bedtime.     metoprolol tartrate (LOPRESSOR) 100 MG tablet Take 1 tablet (100 mg total) by mouth 2 (two) times daily. 180 tablet 3   multivitamin (RENA-VIT) TABS tablet Take 1 tablet by mouth daily.   1   pantoprazole (PROTONIX) 40 MG tablet Take 1 tablet (40 mg total) by mouth daily. 30 tablet 0   QUEtiapine (SEROQUEL) 100 MG tablet Take 100-200 mg by mouth at bedtime as needed (sleep).     QUEtiapine (SEROQUEL) 25 MG tablet Take 50 mg by mouth at bedtime as needed (sleep).     torsemide (DEMADEX) 100 MG tablet Take 1 tablet (100 mg total) by mouth daily. 30 tablet 0   DULoxetine (CYMBALTA) 30 MG capsule Take 1 capsule (30 mg total) by mouth daily.  (Patient not taking: Reported on 05/23/2022) 30 capsule 3   ferric citrate (AURYXIA) 1 GM 210 MG(Fe) tablet Take 210 mg by mouth See admin instructions. Take 210 mg with each meal (Patient not taking: Reported on 05/23/2022)     No current facility-administered medications for this visit.     Allergies:   Patient has no known allergies.   Social History:  The patient  reports that she has been smoking cigarettes. She has a 15.00 pack-year smoking history. She has never used smokeless tobacco. She reports that she does not drink alcohol and does not use drugs.   Family History:   family history includes Diabetes in her mother.    Review of Systems: Review of Systems  Constitutional:  Positive for malaise/fatigue.  HENT: Negative.    Respiratory:  Positive for shortness of  breath.   Cardiovascular:  Positive for leg swelling.  Gastrointestinal: Negative.   Musculoskeletal: Negative.   Neurological: Negative.   Psychiatric/Behavioral: Negative.    All other systems reviewed and are negative.   PHYSICAL EXAM: VS:  BP (!) 144/76 (BP Location: Right Arm, Patient Position: Sitting, Cuff Size: Normal)   Pulse 74   Ht '5\' 6"'$  (1.676 m)   Wt 192 lb 8 oz (87.3 kg)   LMP 06/10/2018 (Approximate)   SpO2 98%   BMI 31.07 kg/m  , BMI Body mass index is 31.07 kg/m. Constitutional:  oriented to person, place, and time. No distress.  HENT:  Head: Grossly normal Eyes:  no discharge. No scleral icterus.  Neck: No JVD, no carotid bruits  Cardiovascular: Regular rate and rhythm, no murmurs appreciated Trace to 1+ pitting lower extremity edema Pulmonary/Chest: Clear to auscultation bilaterally, no wheezes, scattered Rales at the bases Abdominal: Soft.  no distension.  no tenderness.  Musculoskeletal: Normal range of motion Neurological:  normal muscle tone. Coordination normal. No atrophy Skin: Skin warm and dry Psychiatric: normal affect, pleasant  Recent Labs: 02/13/2022: TSH  1.001 04/11/2022: ALT 16 04/12/2022: B Natriuretic Peptide 2,038.6 04/13/2022: BUN 55; Creatinine, Ser 9.92; Hemoglobin 12.2; Magnesium 1.4; Platelets 134; Potassium 4.2; Sodium 139    Lipid Panel Lab Results  Component Value Date   CHOL 121 05/19/2021   HDL 70 05/19/2021   LDLCALC 39 05/19/2021   TRIG 49 05/19/2021      Wt Readings from Last 3 Encounters:  05/23/22 192 lb 8 oz (87.3 kg)  05/01/22 190 lb (86.2 kg)  04/27/22 187 lb 8 oz (85 kg)      ASSESSMENT AND PLAN:  ESRD on dialysis [facility]) -  Therapy on Monday Wednesday Friday Has been going more recently, went last Saturday Feels she has extra fluid, abdominal distention, worsening leg swelling, weight is higher Reports having difficulty with access, sometimes taken off dialysis 30 minutes early for hypotension Reports she is scheduled to see [facility] next week for possible graft revision Also has appointment with vein and vascular mid June Reports torsemide does not work well, has very high device intake  Chronic obstructive pulmonary disease, unspecified COPD type ([place]) - Plan:  Long history of smoking  Smoking cessation recommended On nebulizers/inhalers  Anemia in chronic kidney disease, on chronic dialysis ([place]) - Anemia managed by nephrology, appears stable On Epogen  Atrial flutter Recently noticed on hospital admission April 2023, underwent cardioversion, maintaining normal sinus rhythm Continue metoprolol with Eliquis  benign hypertensive renal disease - Plan: EKG 12-Lead Blood pressure is well controlled on today's visit. No changes made to the medications.  Cardiac hypertrophy Secondary to longstanding hypertension  Ascites Reports ascites has recurred, typically well controlled with aggressive hemodialysis Had paracentesis 2 weeks ago 1.3 L removed Feels that is typically not a problem and does not reaccumulate when she is euvolemic   Total encounter time more than 40 minutes  Greater than 50% was  spent in counseling and coordination of care with the patient     Signed, Esmond Plants, M.D., Ph.D. 05/23/2022  [place], [place]

## 2022-05-23 ENCOUNTER — Ambulatory Visit (INDEPENDENT_AMBULATORY_CARE_PROVIDER_SITE_OTHER): Payer: Medicare Other | Admitting: Cardiovascular Disease

## 2022-05-23 ENCOUNTER — Encounter: Payer: Self-pay | Admitting: Cardiovascular Disease

## 2022-05-23 VITALS — BP 144/76 | HR 74 | Ht 66.0 in | Wt 192.5 lb

## 2022-05-23 DIAGNOSIS — Z992 Dependence on renal dialysis: Secondary | ICD-10-CM

## 2022-05-23 DIAGNOSIS — I272 Pulmonary hypertension, unspecified: Secondary | ICD-10-CM

## 2022-05-23 DIAGNOSIS — Z720 Tobacco use: Secondary | ICD-10-CM

## 2022-05-23 DIAGNOSIS — E1169 Type 2 diabetes mellitus with other specified complication: Secondary | ICD-10-CM

## 2022-05-23 DIAGNOSIS — E785 Hyperlipidemia, unspecified: Secondary | ICD-10-CM

## 2022-05-23 DIAGNOSIS — I6523 Occlusion and stenosis of bilateral carotid arteries: Secondary | ICD-10-CM | POA: Diagnosis not present

## 2022-05-23 DIAGNOSIS — N186 End stage renal disease: Secondary | ICD-10-CM | POA: Diagnosis not present

## 2022-05-23 DIAGNOSIS — I483 Typical atrial flutter: Secondary | ICD-10-CM | POA: Diagnosis not present

## 2022-05-23 DIAGNOSIS — I5032 Chronic diastolic (congestive) heart failure: Secondary | ICD-10-CM | POA: Diagnosis not present

## 2022-05-23 DIAGNOSIS — J449 Chronic obstructive pulmonary disease, unspecified: Secondary | ICD-10-CM

## 2022-05-23 DIAGNOSIS — I10 Essential (primary) hypertension: Secondary | ICD-10-CM

## 2022-05-23 DIAGNOSIS — R002 Palpitations: Secondary | ICD-10-CM

## 2022-05-23 NOTE — Patient Instructions (Signed)
Medication Instructions:  No changes  If you need a refill on your cardiac medications before your next appointment, please call your pharmacy.    Lab work: No new labs needed   Testing/Procedures: No new testing needed   Follow-Up: At [facility], you and your health needs are our priority.  As part of our continuing mission to provide you with exceptional heart care, we have created designated Provider Care Teams.  These Care Teams include your primary Cardiologist (physician) and Advanced Practice Providers (APPs -  Physician Assistants and Nurse Practitioners) who all work together to provide you with the care you need, when you need it.  You will need a follow up appointment in 6 months  Providers on your designated Care Team:   Christopher Berge, NP Ryan Dunn, PA-C Cadence Furth, PA-C  COVID-19 Vaccine Information can be found at: [email or web address] For questions related to vaccine distribution or appointments, please email [email or web address] or call [phone].   

## 2022-06-05 ENCOUNTER — Encounter: Payer: Self-pay | Admitting: Gastroenterology

## 2022-06-05 ENCOUNTER — Ambulatory Visit (INDEPENDENT_AMBULATORY_CARE_PROVIDER_SITE_OTHER): Payer: Medicare Other | Admitting: Gastroenterology

## 2022-06-05 ENCOUNTER — Other Ambulatory Visit: Payer: Self-pay

## 2022-06-05 VITALS — BP 110/70 | HR 80 | Temp 99.1°F | Ht 66.0 in | Wt 199.4 lb

## 2022-06-05 DIAGNOSIS — K625 Hemorrhage of anus and rectum: Secondary | ICD-10-CM | POA: Diagnosis not present

## 2022-06-05 DIAGNOSIS — K5904 Chronic idiopathic constipation: Secondary | ICD-10-CM | POA: Diagnosis not present

## 2022-06-05 DIAGNOSIS — K641 Second degree hemorrhoids: Secondary | ICD-10-CM | POA: Diagnosis not present

## 2022-06-05 MED ORDER — POLYETHYLENE GLYCOL 3350 17 GM/SCOOP PO POWD: 1.0000 | Freq: Every day | ORAL | 3 refills | Status: DC

## 2022-06-05 NOTE — Progress Notes (Signed)

## 2022-06-05 NOTE — Progress Notes (Signed)
Cephas Darby, MD [address]  [place]  [place], [place] [address]  Main: [phone]  Fax: [phone]    Gastroenterology Consultation  Referring Provider:     Remi Haggard, FNP Primary Care Physician:  Remi Haggard, FNP Primary Gastroenterologist:  Dr. Cephas Darby Reason for Consultation:   Symptomatic hemorrhoids, rectal bleeding and constipation        HPI:   Janet Olson is a 52 y.o. female referred by Dr. Dewitt Hoes for consultation & management of ascites, fatty liver.  Patient has end-stage renal disease, currently on hemodialysis who has been noticing several months of gradually worsening abdominal distention, puffiness of her eyes, shortness of breath, swelling of legs.  She underwent ultrasound of her abdomen by Dr. Stefani Dama and was found to have moderate ascites as well as fatty liver, patent portal vein, no splenomegaly.  Therefore, she is referred to GI for further evaluation.  Patient reports that she still makes urine and currently taking low-dose furosemide in addition to undergoing hemodialysis.  She does report severe constipation associated with scant amount of bright red blood per rectum.  She has Linzess 145 MCG samples which she just started taking.  Her main concern today is abdominal discomfort from distention that has resulted in decreased p.o. intake, unable to lay flat and lower abdominal pain.  She does acknowledge eating chips regularly.  She does smoke cigarettes regularly.  She denies fever, chills, nausea or vomiting, melena.  Due to ongoing discomfort, patient went to [facility] [place] on 1/3.  She underwent CBC which was unremarkable, hemoglobin 12, platelets 232, WBC 6.4, LFTs revealed albumin 4, AST 25, ALT 22, alkaline phosphatase 216.  Her BNP was significantly elevated she underwent CT abdomen and pelvis without contrast which revealed diffuse body wall edema and large volume ascites as well as mild umbilical hernia.  Chest  x-ray revealed cardiomegaly and diffuse bilateral interstitial infiltrates.  She does not have a cardiologist.  CT did not reveal cirrhosis or splenomegaly or portal hypertension  Echocardiogram in 04/2017 revealed EF of 55 to 10%, grade 1 diastolic dysfunction, mild pericardial effusion  Follow-up visit 01/24/2021 Patient made an appointment to see me for intermittent bright red blood per rectum associated with rectal pain, discomfort, itching as well as prolapse that has been ongoing for few months.  Patient also has chronic intermittent constipation for which she tried Linzess prescribed by her PCP.  She failed Linzess helped but it resulted in severe diarrhea, therefore stopped taking it.  She is interested in hemorrhoid ligation.  Patient reported that she tried several different over-the-counter medications and has spent a lot of money on it however her hemorrhoidal symptoms are still persistent.  She has paracentesis once about a month for her ascites.  Patient has been started on Creon by her PCP for abdominal bloating, patient is not sure if it is helping.  No known evidence of EPI or chronic pancreatitis  Follow-up visit 04/26/2022 Patient is here for follow-up of worsening of constipation and recurrence of rectal bleeding.  She is out of Linzess and she was taking 72 mcg daily.  She states that she was in depression, missed her dialysis and was hospitalized at [facility] in mid April 2023.  Her bowel movements have been hard, feels bloated and not drinking adequate amount of water, not having adequate amount of fiber.  She noticed that fried foods, hamburger makes her bowels hard.  She also reports rectal bleeding.  She had hemorrhoid ligation in 2022.  Follow-up visit 06/05/2022 Patient is here to discuss about hemorrhoid ligation for recurrence of rectal bleeding.  Patient tried Linzess 1 four 2 mcg which resulted in severe diarrhea and bloating.  She stopped taking Linzess.  She is on MiraLAX which  is somewhat helping to keep moving her bowels.  She is interested in repeat hemorrhoid ligation as she had severe episode last week which has improved.  NSAIDs: none  Antiplts/Anticoagulants/Anti thrombotics: none  GI Procedures: Colonoscopy for rectal bleeding, 04/24/2018 - The examined portion of the ileum was normal. - Three diminutive polyps in the rectum and in the cecum, removed with a cold biopsy forceps. Resected and retrieved. - Moderate diverticulosis in the sigmoid colon. There was no evidence of diverticular bleeding. - Internal hemorrhoids. DIAGNOSIS:  A.  COLON POLYP, CECUM; COLD BIOPSY:  - COLONIC MUCOSA WITH PROMINENT LYMPHOID AGGREGATE.  - NEGATIVE FOR DYSPLASIA AND MALIGNANCY.   B.  RECTUM POLYP; COLD BIOPSY:  - EARLY HYPERPLASTIC POLYP.  - NEGATIVE FOR DYSPLASIA AND MALIGNANCY.   She denies family history of GI malignancy in first-degree or second-degree relatives She did not have any GI surgeries  Past Medical History:  Diagnosis Date   (HFpEF) heart failure with preserved ejection fraction ([place])    a. 2015 Echo: Nl EF. Mild to mod LVH; b. 04/2017 Echo: EF 55-60% Gr1 DD; c. 12/2018 Echo: EF 55-60%; d. 02/2020 Echo: EF 50-55%; e. 10/2020 Echo: EF 55-60%, mild LVH, mildly enlarged RV, sev elev RVSP, BAE, mild MR; 12/2020 Echo: EF 65%, sev dil RA, dil LA, RVSP 29.37mHg, MR/PR.   Abdominal ascites    Acute hypoxemic respiratory failure ([facility] 01/03/2019   Acute renal failure (ARF) ([facility] 05/17/2017   Acute respiratory failure ([facility] 09/25/2018   Anemia    Anxiety    Arthritis    Asthma    Atrial flutter ([facility]    a. Dx 03/2022-->CHA2DS2VASc = 3-->eliquis.   Cardiomegaly    COPD (chronic obstructive pulmonary disease) (HCC)    Depression    [facility] W[facility] , FRI   Dyspnea    ESRD (end stage renal disease) (HCC)    GERD (gastroesophageal reflux disease)    Headache    Migraines   Hypertension    Nonrheumatic tricuspid (valve) insufficiency     Pericardial effusion 01/26/2019   Respiratory failure with hypoxia ([facility] 04/21/2018   Ulcer    gastric   Vitamin D deficiency     Past Surgical History:  Procedure Laterality Date   A/V FISTULAGRAM Left 04/17/2018   Procedure: A/V FISTULAGRAM;  Surgeon: DAlgernon Huxley MD;  [facility];  Service: Cardiovascular;  Laterality: Left;   A/V FISTULAGRAM Left 05/27/2018   Procedure: A/V FISTULAGRAM;  Surgeon: SKatha Cabal MD;  [facility];  Service: Cardiovascular;  Laterality: Left;   A/V FISTULAGRAM Left 07/05/2020   Procedure: A/V FISTULAGRAM;  Surgeon: SKatha Cabal MD;  [facility];  Service: Cardiovascular;  Laterality: Left;   AV FISTULA PLACEMENT Left 08/08/2017   Procedure: ARTERIOVENOUS (AV) FISTULA CREATION ( BRACHIOCEPHALIC );  Surgeon: DAlgernon Huxley MD;  Location: [facility];  Service: Vascular;  Laterality: Left;   AV FISTULA PLACEMENT Left 08/05/2020   Procedure: INSERTION OF ARTERIOVENOUS (AV) GRAFT ARM(BRACHIAL AXILLARY);  Surgeon: SKatha Cabal MD;  Location: [facility];  Service: Vascular;  Laterality: Left;   CARDIOVERSION N/A 05/01/2022   Procedure: CARDIOVERSION;  Surgeon: Minna Merritts, MD;  Location: [facility];  Service: Cardiovascular;  Laterality: N/A;   CESAREAN SECTION     x 4 (1988, 1991, 1997, 2000 )   COLONOSCOPY WITH PROPOFOL N/A 04/24/2018   Procedure: COLONOSCOPY WITH PROPOFOL;  Surgeon: Lin Landsman, MD;  Location: [facility] ENDOSCOPY;  Service: Gastroenterology;  Laterality: N/A;   DIALYSIS/PERMA CATHETER INSERTION N/A 07/16/2017   Procedure: Dialysis/Perma Catheter Insertion;  Surgeon: Katha Cabal, MD;  Location: [facility];  Service: Cardiovascular;  Laterality: N/A;   DIALYSIS/PERMA CATHETER REMOVAL N/A 10/29/2017   Procedure: DIALYSIS/PERMA CATHETER REMOVAL;  Surgeon: Katha Cabal, MD;  Location: [facility];  Service: Cardiovascular;  Laterality: N/A;    DIALYSIS/PERMA CATHETER REMOVAL N/A 11/03/2020   Procedure: DIALYSIS/PERMA CATHETER REMOVAL;  Surgeon: Algernon Huxley, MD;  Location: [facility];  Service: Cardiovascular;  Laterality: N/A;   excision of lymph nodes Bilateral 2014   bilateral under arms.   PARATHYROID IMPLANT REMOVAL     REMOVAL OF A DIALYSIS CATHETER Left 08/05/2020   Procedure: REMOVAL OF A DIALYSIS CATHETER ( FISTULA );  Surgeon: Katha Cabal, MD;  Location: [facility];  Service: Vascular;  Laterality: Left;   SHOULDER ARTHROSCOPY WITH SUBACROMIAL DECOMPRESSION AND OPEN ROTATOR C Right 01/08/2022   Procedure: Right shoulder arthroscopic subscapularis repair, rotator cuff repair, subacromial decompression, and distal clavicle excision;  Surgeon: Leim Fabry, MD;  Location: [facility];  Service: Orthopedics;  Laterality: Right;   TEE WITHOUT CARDIOVERSION N/A 05/01/2022   Procedure: TRANSESOPHAGEAL ECHOCARDIOGRAM (TEE);  Surgeon: Minna Merritts, MD;  Location: [facility];  Service: Cardiovascular;  Laterality: N/A;     Current Outpatient Medications:    acetaminophen (TYLENOL) 500 MG tablet, Take 2 tablets (1,000 mg total) by mouth every 8 (eight) hours., Disp: 90 tablet, Rfl: 2   albuterol (PROVENTIL) (2.5 MG/3ML) 0.083% nebulizer solution, Take 2.5 mg by nebulization every 6 (six) hours as needed for wheezing or shortness of breath. , Disp: , Rfl:    albuterol (VENTOLIN HFA) 108 (90 Base) MCG/ACT inhaler, Inhale 2 puffs into the lungs every 6 (six) hours as needed for wheezing or shortness of breath. , Disp: , Rfl:    amLODipine (NORVASC) 10 MG tablet, Take 10 mg by mouth daily., Disp: , Rfl:    apixaban (ELIQUIS) 5 MG TABS tablet, Take 1 tablet (5 mg total) by mouth 2 (two) times daily., Disp: 60 tablet, Rfl: 11   benzonatate (TESSALON) 200 MG capsule, Take 400 mg by mouth 3 (three) times daily as needed for cough., Disp: , Rfl:    calcium acetate (PHOSLO) 667 MG capsule, Take 2 capsules (1,334 mg total) by mouth 3  (three) times daily with meals., Disp: 60 capsule, Rfl: 0   Cholecalciferol 125 MCG (5000 UT) capsule, Take by mouth., Disp: , Rfl:    cinacalcet (SENSIPAR) 30 MG tablet, Take 1 tablet (30 mg total) by mouth every Monday, Wednesday, and Friday., Disp: 60 tablet, Rfl: 0   DULERA 200-5 MCG/ACT AERO, Inhale 2 puffs into the lungs 2 (two) times daily., Disp: , Rfl: 0   ferric citrate (AURYXIA) 1 GM 210 MG(Fe) tablet, Take 210 mg by mouth See admin instructions. Take 210 mg with each meal, Disp: , Rfl:    furosemide (LASIX) 40 MG tablet, Take 40 mg by mouth daily., Disp: , Rfl:    gabapentin (NEURONTIN) 300 MG capsule, Take by mouth., Disp: , Rfl:    Heparin Sodium, Porcine, (HEPARIN LOCK FLUSH  IJ), Inject 1 Dose as directed every Monday, Wednesday, and Friday. At dialysis, Disp: , Rfl:    hydrOXYzine (ATARAX) 25 MG tablet, Take by mouth., Disp: , Rfl:    hydrOXYzine (ATARAX/VISTARIL) 25 MG tablet, Take 25 mg by mouth in the morning., Disp: , Rfl:    lidocaine-prilocaine (EMLA) cream, 1 application as needed (prior to port being accessed)., Disp: , Rfl:    lumateperone tosylate (CAPLYTA) 42 MG capsule, Take 42 mg by mouth at bedtime., Disp: , Rfl:    Metoprolol Tartrate 75 MG TABS, Take by mouth., Disp: , Rfl:    multivitamin (RENA-VIT) TABS tablet, Take 1 tablet by mouth daily. , Disp: , Rfl: 1   pantoprazole (PROTONIX) 40 MG tablet, Take 1 tablet (40 mg total) by mouth daily., Disp: 30 tablet, Rfl: 0   QUEtiapine (SEROQUEL) 100 MG tablet, Take 100-200 mg by mouth at bedtime as needed (sleep)., Disp: , Rfl:    QUEtiapine (SEROQUEL) 25 MG tablet, Take 50 mg by mouth at bedtime as needed (sleep)., Disp: , Rfl:    torsemide (DEMADEX) 100 MG tablet, Take 1 tablet (100 mg total) by mouth daily., Disp: 30 tablet, Rfl: 0   DULoxetine (CYMBALTA) 30 MG capsule, Take 1 capsule (30 mg total) by mouth daily. (Patient not taking: Reported on 06/05/2022), Disp: 30 capsule, Rfl: 3   linaclotide (LINZESS) 72 MCG  capsule, Take 72 mcg by mouth every evening. (Patient not taking: Reported on 06/05/2022), Disp: , Rfl:    Family History  Problem Relation Age of Onset   Diabetes Mother      Social History   Tobacco Use   Smoking status: Every Day    Packs/day: 0.50    Years: 30.00    Pack years: 15.00    Types: Cigarettes   Smokeless tobacco: Never  Vaping Use   Vaping Use: Never used  Substance Use Topics   Alcohol use: No   Drug use: No    Allergies as of 06/05/2022   (No Known Allergies)    Review of Systems:    All systems reviewed and negative except where noted in HPI.   Physical Exam:  BP 110/70 (BP Location: Left Arm, Patient Position: Sitting, Cuff Size: Normal)   Pulse 80   Temp 99.1 F (37.3 C) (Oral)   Ht '5\' 6"'$  (1.676 m)   Wt 199 lb 6 oz (90.4 kg)   LMP 06/10/2018 (Approximate)   BMI 32.18 kg/m  Patient's last menstrual period was 06/10/2018 (approximate).  General:   Alert,  Well-developed, well-nourished, pleasant and cooperative in NAD Head:  Normocephalic and atraumatic. Eyes:  Sclera clear, no icterus.   Conjunctiva pink. Ears:  Normal auditory acuity. Nose:  No deformity, discharge, or lesions. Mouth:  No deformity or lesions,oropharynx pink & moist. Neck:  Supple; no masses or thyromegaly. Lungs:  Respirations even and unlabored.  Clear throughout to auscultation.   No wheezes, crackles, or rhonchi. No acute distress. Heart:  Regular rate and rhythm; no murmurs, clicks, rubs, or gallops. Abdomen:  Normal bowel sounds. Soft, distended, with nontender umbilical hernia present, non-tender without masses, hepatosplenomegaly noted.  No guarding or rebound tenderness.   Rectal: Normal perianal exam, nontender digital rectal exam, anoscopy revealed prolapsed external hemorrhoids, with stigmata of recent bleeding Msk:  Symmetrical without gross deformities. Good, equal movement & strength bilaterally. Pulses:  Normal pulses noted. Extremities:  No clubbing, no  edema.  No cyanosis, left upper arm AV fistula. Neurologic:  Alert and oriented x3;  grossly  normal neurologically. Skin:  Intact without significant lesions or rashes. No jaundice. Psych:  Alert and cooperative. Normal mood and affect.  Imaging Studies: reviewed  Assessment and Plan:   Janet Olson is a 52 y.o. African-American female with end stage renal disease secondary to hypertension on hemodialysis, ascites secondary to ESRD and CHF is seen in consultation for symptomatic hemorrhoids, chronic constipation  Chronic constipation Discussed about high-fiber diet, fiber supplements, adequate intake of water Patient did not tolerate even low-dose Linzess Continue to take MiraLAX 1-2 times daily, samples provided  Symptomatic, grade 2 hemorrhoids with rectal bleeding s/p ligation of all 3 hemorrhoids in 2022 Proceed with hemorrhoid ligation today, consent obtained  Follow up in 2 weeks   Cephas Darby, MD

## 2022-06-13 ENCOUNTER — Other Ambulatory Visit (INDEPENDENT_AMBULATORY_CARE_PROVIDER_SITE_OTHER): Payer: Self-pay | Admitting: Vascular Surgery

## 2022-06-13 DIAGNOSIS — N186 End stage renal disease: Secondary | ICD-10-CM

## 2022-06-14 ENCOUNTER — Ambulatory Visit (INDEPENDENT_AMBULATORY_CARE_PROVIDER_SITE_OTHER): Payer: Medicare Other

## 2022-06-14 ENCOUNTER — Ambulatory Visit (INDEPENDENT_AMBULATORY_CARE_PROVIDER_SITE_OTHER): Payer: Medicare Other | Admitting: Vascular Surgery

## 2022-06-14 ENCOUNTER — Encounter (INDEPENDENT_AMBULATORY_CARE_PROVIDER_SITE_OTHER): Payer: Self-pay | Admitting: Vascular Surgery

## 2022-06-14 VITALS — BP 124/72 | HR 79 | Resp 16 | Wt 202.0 lb

## 2022-06-14 DIAGNOSIS — K219 Gastro-esophageal reflux disease without esophagitis: Secondary | ICD-10-CM

## 2022-06-14 DIAGNOSIS — J449 Chronic obstructive pulmonary disease, unspecified: Secondary | ICD-10-CM

## 2022-06-14 DIAGNOSIS — I10 Essential (primary) hypertension: Secondary | ICD-10-CM | POA: Diagnosis not present

## 2022-06-14 DIAGNOSIS — N186 End stage renal disease: Secondary | ICD-10-CM

## 2022-06-14 DIAGNOSIS — I6523 Occlusion and stenosis of bilateral carotid arteries: Secondary | ICD-10-CM

## 2022-06-14 DIAGNOSIS — Z992 Dependence on renal dialysis: Secondary | ICD-10-CM

## 2022-06-15 ENCOUNTER — Telehealth (INDEPENDENT_AMBULATORY_CARE_PROVIDER_SITE_OTHER): Payer: Self-pay

## 2022-06-15 NOTE — Telephone Encounter (Signed)
I attempted to contact the patient to scheduled a left arm fistulagram with Dr. Delana Meyer and a message was left for a return call.

## 2022-06-17 ENCOUNTER — Encounter (INDEPENDENT_AMBULATORY_CARE_PROVIDER_SITE_OTHER): Payer: Self-pay | Admitting: Vascular Surgery

## 2022-06-17 NOTE — Progress Notes (Signed)
MRN : [id number]  Janet Olson is a 52 y.o. (1970/06/02) female who presents with chief complaint of check access.  History of Present Illness:   The patient returns to the office for follow up regarding a problem with their dialysis access.   The patient notes a significant increase in bleeding time after decannulation.  The patient has also been informed that there is increased recirculation.    The patient denies hand pain or other symptoms consistent with steal phenomena.  No significant arm swelling.  The patient denies redness or swelling at the access site. The patient denies fever or chills at home or while on dialysis.  No recent shortening of the patient's walking distance or new symptoms consistent with claudication.  No history of rest pain symptoms. No new ulcers or wounds of the lower extremities have occurred.  The patient denies amaurosis fugax or recent TIA symptoms. There are no recent neurological changes noted. There is no history of DVT, PE or superficial thrombophlebitis. No recent episodes of angina or shortness of breath documented.   Duplex ultrasound of the AV access shows a patent access.  Flow volume today is 2189 cc/min (previous flow volume was 1808 cc/min).  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   albuterol (PROVENTIL) (2.5 MG/3ML) 0.083% nebulizer solution Take 2.5 mg by nebulization every 6 (six) hours as needed for wheezing or shortness of breath.    albuterol (VENTOLIN HFA) 108 (90 Base) MCG/ACT inhaler Inhale 2 puffs into the lungs every 6 (six) hours as needed for wheezing or shortness of breath.    amLODipine (NORVASC) 10 MG tablet Take 10 mg by mouth daily.   apixaban (ELIQUIS) 5 MG TABS tablet Take 1 tablet (5 mg total) by mouth 2 (two) times daily.   benzonatate (TESSALON) 200 MG capsule Take 400 mg by  mouth 3 (three) times daily as needed for cough.   calcium acetate (PHOSLO) 667 MG capsule Take 2 capsules (1,334 mg total) by mouth 3 (three) times daily with meals.   Cholecalciferol 125 MCG (5000 UT) capsule Take by mouth.   cinacalcet (SENSIPAR) 30 MG tablet Take 1 tablet (30 mg total) by mouth every Monday, Wednesday, and Friday.   DULERA 200-5 MCG/ACT AERO Inhale 2 puffs into the lungs 2 (two) times daily.   ferric citrate (AURYXIA) 1 GM 210 MG(Fe) tablet Take 210 mg by mouth See admin instructions. Take 210 mg with each meal   furosemide (LASIX) 40 MG tablet Take 40 mg by mouth daily.   gabapentin (NEURONTIN) 300 MG capsule Take by mouth.   Heparin Sodium, Porcine, (HEPARIN LOCK FLUSH IJ) Inject 1 Dose as directed every Monday, Wednesday, and Friday. At dialysis   hydrOXYzine (ATARAX) 25 MG tablet Take by mouth.   hydrOXYzine (ATARAX/VISTARIL) 25 MG tablet Take 25 mg by mouth in the morning.   lidocaine-prilocaine (EMLA) cream 1 application as needed (prior to port being accessed).   lumateperone tosylate (CAPLYTA) 42 MG capsule Take 42 mg by mouth at bedtime.   Metoprolol Tartrate 75 MG TABS Take by mouth.   multivitamin (RENA-VIT) TABS tablet Take 1 tablet by mouth daily.    pantoprazole (PROTONIX) 40 MG tablet Take 1 tablet (40 mg total) by mouth daily.   polyethylene glycol powder (GLYCOLAX/MIRALAX) 17 GM/SCOOP powder Take 255 g by mouth daily.   QUEtiapine (SEROQUEL)  100 MG tablet Take 100-200 mg by mouth at bedtime as needed (sleep).   QUEtiapine (SEROQUEL) 25 MG tablet Take 50 mg by mouth at bedtime as needed (sleep).   torsemide (DEMADEX) 100 MG tablet Take 1 tablet (100 mg total) by mouth daily.    Past Medical History:  Diagnosis Date   (HFpEF) heart failure with preserved ejection fraction ([place])    a. 2015 Echo: Nl EF. Mild to mod LVH; b. 04/2017 Echo: EF 55-60% Gr1 DD; c. 12/2018 Echo: EF 55-60%; d. 02/2020 Echo: EF 50-55%; e. 10/2020 Echo: EF 55-60%, mild LVH, mildly  enlarged RV, sev elev RVSP, BAE, mild MR; 12/2020 Echo: EF 65%, sev dil RA, dil LA, RVSP 29.84mHg, MR/PR.   Abdominal ascites    Acute hypoxemic respiratory failure ([place] 01/03/2019   Ac[place] ([place]F) ([place] 05/17/2017   [place]ute [place] failure ([place] 09/25/2018   Anemia    Anxiety    Arthritis    Asthma    Atrial flutter ([place]    a. Dx 03/2022-->CHA2DS2VASc = 3-->eliquis.   Cardiomegaly    COPD (chronic obstructive pulmonary disease) (HCC)    Depression    Dialysis patient ([facility]    MON, WED , FRI   Dyspnea    ESRD (end stage renal disease) (HCC)    GERD (gastroesophageal reflux disease)    Headache    Migraines   Hypertension    Nonrheumatic tricuspid (valve) insufficiency    Pericardial effusion 01/26/2019   Respiratory failure with hypoxia ([place] 04/21/2018   Ulcer    gastric   Vitamin D deficiency     Past Surgical History:  Procedure Laterality Date   A/V FISTULAGRAM Left 04/17/2018   Procedure: A/V FISTULAGRAM;  Surgeon: DAlgernon Huxley MD;  [facility];  Service: Cardiovascular;  Laterality: Left;   A/V FISTULAGRAM Left 05/27/2018   Procedure: A/V FISTULAGRAM;  Surgeon: SKatha Cabal MD;  [facility];  Service: Cardiovascular;  Laterality: Left;   A/V FISTULAGRAM Left 07/05/2020   Procedure: A/V FISTULAGRAM;  Surgeon: SKatha Cabal MD;  [facility];  Service: Cardiovascular;  Laterality: Left;   AV FISTULA PLACEMENT Left 08/08/2017   Procedure: ARTERIOVENOUS (AV) FISTULA CREATION ( BRACHIOCEPHALIC );  Surgeon: DAlgernon Huxley MD;  Location: [facility];  Service: Vascular;  Laterality: Left;   AV FISTULA PLACEMENT Left 08/05/2020   Procedure: INSERTION OF ARTERIOVENOUS (AV) GRAFT ARM(BRACHIAL AXILLARY);  Surgeon: SKatha Cabal MD;  Location: [facility];  Service: Vascular;  Laterality: Left;   CARDIOVERSION N/A 05/01/2022   Procedure: CARDIOVERSION;  Surgeon: GMinna Merritts MD;  Location: [facility];   Service: Cardiovascular;  Laterality: N/A;   CESAREAN SECTION     x 4 (1988, 1991, 1997, 2000 )   COLONOSCOPY WITH PROPOFOL N/A 04/24/2018   Procedure: COLONOSCOPY WITH PROPOFOL;  Surgeon: VLin Landsman MD;  [facility];  Service: Gastroenterology;  Laterality: N/A;   DIALYSIS/PERMA CATHETER INSERTION N/A 07/16/2017   Procedure: Dialysis/Perma Catheter Insertion;  Surgeon: SKatha Cabal MD;  [facility];  Service: Cardiovascular;  Laterality: N/A;   DIALYSIS/PERMA CATHETER REMOVAL N/A 10/29/2017   Procedure: DIALYSIS/PERMA CATHETER REMOVAL;  Surgeon: SKatha Cabal MD;  [facility];  Service: Cardiovascular;  Laterality: N/A;   DIALYSIS/PERMA CATHETER REMOVAL N/A 11/03/2020   Procedure: DIALYSIS/PERMA CATHETER REMOVAL;  Surgeon: DAlgernon Huxley MD;  [facility];  Service: Cardiovascular;  Laterality: N/A;  excision of lymph nodes Bilateral 2014   bilateral under arms.   PARATHYROID IMPLANT REMOVAL     REMOVAL OF A DIALYSIS CATHETER Left 08/05/2020   Procedure: REMOVAL OF A DIALYSIS CATHETER ( FISTULA );  Surgeon: Katha Cabal, MD;  Location: [facility];  Service: Vascular;  Laterality: Left;   SHOULDER ARTHROSCOPY WITH SUBACROMIAL DECOMPRESSION AND OPEN ROTATOR C Right 01/08/2022   Procedure: Right shoulder arthroscopic subscapularis repair, rotator cuff repair, subacromial decompression, and distal clavicle excision;  Surgeon: Leim Fabry, MD;  Location: [facility];  Service: Orthopedics;  Laterality: Right;   TEE WITHOUT CARDIOVERSION N/A 05/01/2022   Procedure: TRANSESOPHAGEAL ECHOCARDIOGRAM (TEE);  Surgeon: Minna Merritts, MD;  Location: [facility];  Service: Cardiovascular;  Laterality: N/A;    Social History Social History   Tobacco Use   Smoking status: Every Day    Packs/day: 0.50    Years: 30.00    Total pack years: 15.00    Types: Cigarettes   Smokeless tobacco: Never  Vaping Use   Vaping Use:  Never used  Substance Use Topics   Alcohol use: No   Drug use: No    Family History Family History  Problem Relation Age of Onset   Diabetes Mother     No Known Allergies   REVIEW OF SYSTEMS (Negative unless checked)  Constitutional: '[]'$ Weight loss  '[]'$ Fever  '[]'$ Chills Cardiac: '[]'$ Chest pain   '[]'$ Chest pressure   '[]'$ Palpitations   '[]'$ Shortness of breath when laying flat   '[]'$ Shortness of breath with exertion. Vascular:  '[]'$ Pain in legs with walking   '[]'$ Pain in legs at rest  '[]'$ History of DVT   '[]'$ Phlebitis   '[]'$ Swelling in legs   '[]'$ Varicose veins   '[]'$ Non-healing ulcers Pulmonary:   '[]'$ Uses home oxygen   '[]'$ Productive cough   '[]'$ Hemoptysis   '[]'$ Wheeze  '[]'$ COPD   '[]'$ Asthma Neurologic:  '[]'$ Dizziness   '[]'$ Seizures   '[]'$ History of stroke   '[]'$ History of TIA  '[]'$ Aphasia   '[]'$ Vissual changes   '[]'$ Weakness or numbness in arm   '[]'$ Weakness or numbness in leg Musculoskeletal:   '[]'$ Joint swelling   '[]'$ Joint pain   '[]'$ Low back pain Hematologic:  '[]'$ Easy bruising  '[]'$ Easy bleeding   '[]'$ Hypercoagulable state   '[]'$ Anemic Gastrointestinal:  '[]'$ Diarrhea   '[]'$ Vomiting  '[x]'$ Gastroesophageal reflux/heartburn   '[]'$ Difficulty swallowing. Genitourinary:  '[x]'$ Chronic kidney disease   '[]'$ Difficult urination  '[]'$ Frequent urination   '[]'$ Blood in urine Skin:  '[]'$ Rashes   '[]'$ Ulcers  Psychological:  '[]'$ History of anxiety   '[]'$  History of major depression.  Physical Examination  Vitals:   06/14/22 1456  BP: 124/72  Pulse: 79  Resp: 16  Weight: 202 lb (91.6 kg)   Body mass index is 32.6 kg/m. Gen: WD/WN, NAD Head: [place]/AT, No temporalis wasting.  Ear/Nose/Throat: Hearing grossly intact, nares w/o erythema or drainage Eyes: PER, EOMI, sclera nonicteric.  Neck: Supple, no gross masses or lesions.  No JVD.  Pulmonary:  Good air movement, no audible wheezing, no use of accessory muscles.  Cardiac: RRR, precordium non-hyperdynamic. Vascular:   left arm AV access moderately pulsatile pseudoaneurysms noted mildly tender Vessel Right Left  Radial  Palpable Palpable  Brachial Palpable Palpable  Gastrointestinal: soft, non-distended. No guarding/no peritoneal signs.  Musculoskeletal: M/S 5/5 throughout.  No deformity.  Neurologic: CN 2-12 intact. Pain and light touch intact in extremities.  Symmetrical.  Speech is fluent. Motor exam as listed above. Psychiatric: Judgment intact, Mood & affect appropriate for pt's clinical situation. Dermatologic: No rashes or ulcers noted.  No changes consistent with  cellulitis.   CBC Lab Results  Component Value Date   WBC 3.0 (L) 04/13/2022   HGB 12.2 04/13/2022   HCT 38.1 04/13/2022   MCV 84.7 04/13/2022   PLT 134 (L) 04/13/2022    BMET    Component Value Date/Time   NA 139 04/13/2022 0554   NA 139 09/23/2014 1741   K 4.2 04/13/2022 0554   K 4.2 09/23/2014 1741   CL 101 04/13/2022 0554   CL 112 (H) 09/23/2014 1741   CO2 25 04/13/2022 0554   CO2 21 09/23/2014 1741   GLUCOSE 93 04/13/2022 0554   GLUCOSE 82 09/23/2014 1741   BUN 55 (H) 04/13/2022 0554   BUN 18 09/23/2014 1741   CREATININE 9.92 (H) 04/13/2022 0554   CREATININE 1.41 (H) 09/23/2014 1741   CALCIUM 8.7 (L) 04/13/2022 0554   CALCIUM 8.4 (L) 01/03/2019 0243   GFRNONAA 4 (L) 04/13/2022 0554   GFRNONAA 43 (L) 09/23/2014 1741   GFRNONAA 43 (L) 11/20/2012 1129   GFRAA 3 (L) 08/08/2020 1303   GFRAA 52 (L) 09/23/2014 1741   GFRAA 49 (L) 11/20/2012 1129   CrCl cannot be calculated (Patient's most recent lab result is older than the maximum 21 days allowed.).  COAG Lab Results  Component Value Date   INR 1.2 01/08/2022   INR 1.4 (H) 11/03/2020   INR 1.5 (H) 11/02/2020    Radiology VAS US DUPLEX DIALYSIS ACCESS (AVF, AVG)  Result Date: 06/14/2022 DIALYSIS ACCESS Patient Name:  Janet Olson  Date of Exam:   06/14/2022 Medical Rec #: [id number]         Accession #:    [id number] Date of Birth: March 01, 1970         Patient Gender: F Patient Age:   53 years Exam Location[facility]  [facility] Procedure:      VAS US  DUPLEX DIALYSIS ACCESS (AVF, AVG) Referring Phys: Hortencia Pilar --------------------------------------------------------------------------------  Reason for Exam: Routine follow up. Access Site: Left Upper Extremity. History: 08/08/17: Left brachial-cephalic AVF;          5/36/14: Left proximal to mid upper arm cephalic vein PTA;          05/27/18: Left mid upper arm cephalic vein PTA with branch coil          embolization          08/05/20: Left brachial-axillary AVG placement with Artegraft;Marland Kitchen Performing Technologist: Blondell Reveal RT, RDMS, RVT  Examination Guidelines: A complete evaluation includes B-mode imaging, spectral Doppler, color Doppler, and power Doppler as needed of all accessible portions of each vessel. Unilateral testing is considered an integral part of a complete examination. Limited examinations for reoccurring indications may be performed as noted.  Findings: +--------------------+----------+-----------------+--------+ AVG                 PSV (cm/s)Flow Vol (mL/min)Describe +--------------------+----------+-----------------+--------+ Native artery inflow   374          2189                +--------------------+----------+-----------------+--------+ Arterial anastomosis   300                              +--------------------+----------+-----------------+--------+ Prox graft             426                              +--------------------+----------+-----------------+--------+  Mid graft              327                              +--------------------+----------+-----------------+--------+ Distal graft           202                              +--------------------+----------+-----------------+--------+ Venous anastomosis     205                              +--------------------+----------+-----------------+--------+ Venous outflow         143                              +--------------------+----------+-----------------+--------+ Multiphasic,  antegrade flow in the left distal radial artery.  Summary: Patent left brachial-axillary AVG with normal Flow Volume and no significant focal stenosis. Two pseudoaneurysms noted in the mid upper arm area measuring 1.7 x 1.4 x 0.7cm and 1.3 x 0.7 x 0.3cm.  *See table(s) above for measurements and observations.  Diagnosing physician: Hortencia Pilar MD Electronically signed by Hortencia Pilar MD on 06/14/2022 at 4:47:21 PM.  --------------------------------------------------------------------------------   Final      Assessment/Plan 1. ESRD (end stage renal disease) ([place]) Recommend:  The patient is experiencing increasing problems with their dialysis access.  Patient should have a fistulagram with the intention for intervention and or planning for a revision given the pseudoaneurysms.  The purpose for intervention/ revision is to restore appropriate flow and prevent thrombosis and possible loss of the access.  As well as improve the quality of dialysis therapy.  The risks, benefits and alternative therapies were reviewed in detail with the patient.  All questions were answered.  The patient agrees to proceed with angio/intervention.    The patient will follow up with me in the office after the procedure.   - VAS US DUPLEX DIALYSIS ACCESS (AVF, AVG)  2. ESRD on dialysis [facility]) Recommend:  The patient is experiencing increasing problems with their dialysis access.  Patient should have a fistulagram with the intention for intervention and or planning for a revision given the pseudoaneurysms.  The purpose for intervention/ revision is to restore appropriate flow and prevent thrombosis and possible loss of the access.  As well as improve the quality of dialysis therapy.  The risks, benefits and alternative therapies were reviewed in detail with the patient.  All questions were answered.  The patient agrees to proceed with angio/intervention.    The patient will follow up with me in the office after  the procedure.  3. Benign essential HTN Continue antihypertensive medications as already ordered, these medications have been reviewed and there are no changes at this time.   4. Chronic obstructive pulmonary disease, unspecified COPD type ([place]) Continue pulmonary medications and aerosols as already ordered, these medications have been reviewed and there are no changes at this time.    5. Gastroesophageal reflux disease without esophagitis Continue PPI as already ordered, this medication has been reviewed and there are no changes at this time.  Avoidence of caffeine and alcohol  Moderate elevation of the head of the bed      Hortencia Pilar, MD  06/17/2022 3:37 PM

## 2022-06-20 ENCOUNTER — Telehealth (INDEPENDENT_AMBULATORY_CARE_PROVIDER_SITE_OTHER): Payer: Self-pay

## 2022-06-20 NOTE — Telephone Encounter (Signed)
I attempted to contact the patient to schedule a left arm fistulagram with Dr. Delana Meyer and a message was left for a return call.

## 2022-06-21 NOTE — Telephone Encounter (Signed)
Patient called back and is scheduled with Dr. Delana Meyer for a left arm fistulagram on 06/26/22 with a 8:00 am arrival time to the [facility]. Pre-procedure instructions were discussed and will be mailed.

## 2022-06-25 ENCOUNTER — Ambulatory Visit (INDEPENDENT_AMBULATORY_CARE_PROVIDER_SITE_OTHER): Payer: Medicare Other | Admitting: Gastroenterology

## 2022-06-25 ENCOUNTER — Encounter: Payer: Self-pay | Admitting: Gastroenterology

## 2022-06-25 VITALS — BP 120/75 | HR 77 | Temp 98.5°F | Ht 66.0 in | Wt 202.4 lb

## 2022-06-25 DIAGNOSIS — K5904 Chronic idiopathic constipation: Secondary | ICD-10-CM

## 2022-06-25 DIAGNOSIS — K641 Second degree hemorrhoids: Secondary | ICD-10-CM

## 2022-06-26 ENCOUNTER — Other Ambulatory Visit: Payer: Self-pay

## 2022-06-26 ENCOUNTER — Ambulatory Visit
Admission: RE | Admit: 2022-06-26 | Discharge: 2022-06-26 | Disposition: A | Discharge status: Home / Self Care | Payer: Medicare Other | Attending: Vascular Surgery | Admitting: Vascular Surgery

## 2022-06-26 ENCOUNTER — Encounter: Payer: Self-pay | Admitting: Vascular Surgery

## 2022-06-26 ENCOUNTER — Encounter
Admission: RE | Disposition: A | Discharge status: Home / Self Care | Payer: Self-pay | Source: Home / Self Care | Attending: Vascular Surgery

## 2022-06-26 DIAGNOSIS — I132 Hypertensive heart and chronic kidney disease with heart failure and with stage 5 chronic kidney disease, or end stage renal disease: Secondary | ICD-10-CM | POA: Diagnosis not present

## 2022-06-26 DIAGNOSIS — K219 Gastro-esophageal reflux disease without esophagitis: Secondary | ICD-10-CM | POA: Insufficient documentation

## 2022-06-26 DIAGNOSIS — Z992 Dependence on renal dialysis: Secondary | ICD-10-CM | POA: Diagnosis not present

## 2022-06-26 DIAGNOSIS — J449 Chronic obstructive pulmonary disease, unspecified: Secondary | ICD-10-CM | POA: Insufficient documentation

## 2022-06-26 DIAGNOSIS — Z87891 Personal history of nicotine dependence: Secondary | ICD-10-CM | POA: Diagnosis not present

## 2022-06-26 DIAGNOSIS — Y841 Kidney dialysis as the cause of abnormal reaction of the patient, or of later complication, without mention of misadventure at the time of the procedure: Secondary | ICD-10-CM | POA: Insufficient documentation

## 2022-06-26 DIAGNOSIS — I5032 Chronic diastolic (congestive) heart failure: Secondary | ICD-10-CM | POA: Diagnosis not present

## 2022-06-26 DIAGNOSIS — T82898A Other specified complication of vascular prosthetic devices, implants and grafts, initial encounter: Secondary | ICD-10-CM

## 2022-06-26 DIAGNOSIS — N186 End stage renal disease: Secondary | ICD-10-CM | POA: Diagnosis not present

## 2022-06-26 HISTORY — PX: A/V FISTULAGRAM: CATH118298

## 2022-06-26 LAB — POTASSIUM (ARMC VASCULAR LAB ONLY): Potassium (ARMC vascular lab): 5.3 mmol/L — ABNORMAL HIGH (ref 3.5–5.1)

## 2022-06-26 SURGERY — A/V FISTULAGRAM
Anesthesia: Moderate Sedation | Laterality: Left

## 2022-06-26 MED ORDER — FENTANYL CITRATE (PF) 100 MCG/2ML IJ SOLN
INTRAMUSCULAR | Status: DC | PRN
  Administered 2022-06-26: 25 ug via INTRAVENOUS

## 2022-06-26 MED ORDER — MIDAZOLAM HCL 2 MG/ML PO SYRP: 8.0000 mg | ORAL_SOLUTION | Freq: Once | ORAL | Status: DC | PRN

## 2022-06-26 MED ORDER — MIDAZOLAM HCL 2 MG/2ML IJ SOLN
INTRAMUSCULAR | Status: AC
  Filled 2022-06-26: qty 2

## 2022-06-26 MED ORDER — FAMOTIDINE 20 MG PO TABS: 40.0000 mg | ORAL_TABLET | Freq: Once | ORAL | Status: DC | PRN

## 2022-06-26 MED ORDER — ONDANSETRON HCL 4 MG/2ML IJ SOLN: 4.0000 mg | Freq: Four times a day (QID) | INTRAMUSCULAR | Status: DC | PRN

## 2022-06-26 MED ORDER — IODIXANOL 320 MG/ML IV SOLN
INTRAVENOUS | Status: DC | PRN
  Administered 2022-06-26: 30 mL

## 2022-06-26 MED ORDER — HEPARIN SODIUM (PORCINE) 1000 UNIT/ML IJ SOLN
INTRAMUSCULAR | Status: AC
  Filled 2022-06-26: qty 10

## 2022-06-26 MED ORDER — DIPHENHYDRAMINE HCL 50 MG/ML IJ SOLN: 50.0000 mg | Freq: Once | INTRAMUSCULAR | Status: DC | PRN

## 2022-06-26 MED ORDER — HYDROMORPHONE HCL 1 MG/ML IJ SOLN: 1.0000 mg | Freq: Once | INTRAMUSCULAR | Status: DC | PRN

## 2022-06-26 MED ORDER — CEFAZOLIN SODIUM-DEXTROSE 1-4 GM/50ML-% IV SOLN: 1.0000 g | INTRAVENOUS | Status: AC

## 2022-06-26 MED ORDER — MIDAZOLAM HCL 2 MG/2ML IJ SOLN
INTRAMUSCULAR | Status: DC | PRN
  Administered 2022-06-26: 2 mg via INTRAVENOUS

## 2022-06-26 MED ORDER — SODIUM CHLORIDE 0.9 % IV SOLN: INTRAVENOUS | Status: DC

## 2022-06-26 MED ORDER — CEFAZOLIN SODIUM-DEXTROSE 1-4 GM/50ML-% IV SOLN
INTRAVENOUS | Status: AC
  Administered 2022-06-26: 1 g via INTRAVENOUS
  Filled 2022-06-26: qty 50

## 2022-06-26 MED ORDER — METHYLPREDNISOLONE SODIUM SUCC 125 MG IJ SOLR
125.0000 mg | Freq: Once | INTRAMUSCULAR | Status: DC | PRN
Start: 2022-06-26 — End: 2022-06-26

## 2022-06-26 MED ORDER — FENTANYL CITRATE PF 50 MCG/ML IJ SOSY
PREFILLED_SYRINGE | INTRAMUSCULAR | Status: AC
  Filled 2022-06-26: qty 1

## 2022-06-26 SURGICAL SUPPLY — 8 items
COVER PROBE U/S 5X48 (MISCELLANEOUS) ×1 IMPLANT
DRAPE BRACHIAL (DRAPES) ×1 IMPLANT
NDL ENTRY 21GA 7CM ECHOTIP (NEEDLE) IMPLANT
NEEDLE ENTRY 21GA 7CM ECHOTIP (NEEDLE) ×2 IMPLANT
PACK ANGIOGRAPHY (CUSTOM PROCEDURE TRAY) ×2 IMPLANT
SET INTRO CAPELLA COAXIAL (SET/KITS/TRAYS/PACK) ×1 IMPLANT
SHEATH BRITE TIP 6FRX5.5 (SHEATH) ×1 IMPLANT
SUT MNCRL AB 4-0 PS2 18 (SUTURE) ×1 IMPLANT

## 2022-07-24 NOTE — Progress Notes (Deleted)
MRN : [id number]  Janet Olson is a 52 y.o. (1970/02/25) female who presents with chief complaint of check access.  History of Present Illness:   The patient returns to the office for followup status post angiography of their dialysis access 06/26/2022.  Based on the images,  Small pseudoaneurysm otherwise the fistula is widely patent central veins are widely patent.    Following the angiogram the access function has been stable, with adequate flow rates and KT/V. The patient has not been experiencing increased bleeding times following decannulation and the patient denies increased recirculation. The patient denies an increase in arm swelling. At the present time the patient denies hand pain.  No recent shortening of the patient's walking distance or new symptoms consistent with claudication.  No history of rest pain symptoms. No new ulcers or wounds of the lower extremities have occurred.  The patient denies amaurosis fugax or recent TIA symptoms. There are no recent neurological changes noted. There is no history of DVT, PE or superficial thrombophlebitis. No recent episodes of angina or shortness of breath documented.   Duplex ultrasound of the AV access shows a patent access.  The previously noted stenosis is improved compared to last study.  Flow volume today is *** cc/min (previous flow volume was *** cc/min)      No outpatient medications have been marked as taking for the 07/26/22 encounter (Appointment) with Delana Meyer, Dolores Lory, MD.    Past Medical History:  Diagnosis Date   (HFpEF) heart failure with preserved ejection fraction ([place])    a. 2015 Echo: Nl EF. Mild to mod LVH; b. 04/2017 Echo: EF 55-60% Gr1 DD; c. 12/2018 Echo: EF 55-60%; d. 02/2020 Echo: EF 50-55%; e. 10/2020 Echo: EF 55-60%, mild LVH, mildly enlarged RV, sev elev RVSP, BAE, mild MR; 12/2020 Echo: EF 65%, sev dil RA, dil LA, RVSP 29.74mHg, MR/PR.   Abdominal ascites    Acute hypoxemic  respiratory failure ([facility] 01/03/2019   Acute renal failure (ARF) ([facility] 05/17/2017   Acute respiratory failure ([facility] 09/25/2018   Anemia    Anxiety    Arthritis    Asthma    Atrial flutter ([facility]    a. Dx 03/2022-->CHA2DS2VASc = 3-->eliquis.   Cardiomegaly    COPD (chronic obstructive pulmonary disease) (HCC)    Depression    [place]ialysis patient ([facility] [place]    MON, WED , FRI   Dyspnea    ESRD (end stage renal disease) (HCC)    GERD (gastroesophageal reflux disease)    Headache    Migraines   Hypertension    Nonrheumatic tricuspid (valve) insufficiency    Pericardial effusion 01/26/2019   Respiratory failure with hypoxia ([facility] 04/21/2018   Ulcer    gastric   Vitamin D deficiency     Past Surgical History:  Procedure Laterality Date   A/V FISTULAGRAM Left 04/17/2018   Procedure: A/V FISTULAGRAM;  Surgeon: DAlgernon Huxley MD;  [facility];  Service: Cardiovascular;  Laterality: Left;   A/V FISTULAGRAM Left 05/27/2018   Procedure: A/V FISTULAGRAM;  Surgeon: SKatha Cabal MD;  [facility];  Service: Cardiovascular;  Laterality: Left;   A/V FISTULAGRAM Left 07/05/2020   Procedure: A/V FISTULAGRAM;  Surgeon: SKatha Cabal MD;  [place];  Service: Cardiovascular;  Laterality: Left;   A/V FISTULAGRAM Left 06/26/2022   Procedure:  A/V Fistulagram;  Surgeon: Katha Cabal, MD;  Location: [place] [facility];  Service: Cardiovascular;  Laterality: Left;   AV FISTULA PLACEMENT Left 08/08/2017   Procedure: ARTERIOVENOUS (AV) FISTULA CREATION ( BRACHIOCEPHALIC );  Surgeon: Algernon Huxley, MD;  Location: [facility];  Service: Vascular;  Laterality: Left;   AV FISTULA PLACEMENT Left 08/05/2020   Procedure: INSERTION OF ARTERIOVENOUS (AV) GRAFT ARM(BRACHIAL AXILLARY);  Surgeon: Katha Cabal, MD;  Location: [facility];  Service: Vascular;  Laterality: Left;   CARDIOVERSION N/A 05/01/2022   Procedure: CARDIOVERSION;  Surgeon: Minna Merritts, MD;  Location: [facility];  Service: Cardiovascular;  Laterality: N/A;   CESAREAN SECTION     x 4 (1988, 1991, 1997, 2000 )   COLONOSCOPY WITH PROPOFOL N/A 04/24/2018   Procedure: COLONOSCOPY WITH PROPOFOL;  Surgeon: Lin Landsman, MD;  Location: [facility] ENDOSCOPY;  Service: Gastroenterology;  Laterality: N/A;   DIALYSIS/PERMA CATHETER INSERTION N/A 07/16/2017   Procedure: Dialysis/Perma Catheter Insertion;  Surgeon: Katha Cabal, MD;  Location: [place] [facility];  Service: Cardiovascular;  Laterality: N/A;   DIALYSIS/PERMA CATHETER REMOVAL N/A 10/29/2017   Procedure: DIALYSIS/PERMA CATHETER REMOVAL;  Surgeon: Katha Cabal, MD;  Location: [place] [facility];  Service: Cardiovascular;  Laterality: N/A;   DIALYSIS/PERMA CATHETER REMOVAL N/A 11/03/2020   Procedure: DIALYSIS/PERMA CATHETER REMOVAL;  Surgeon: Algernon Huxley, MD;  Location: [place] [facility];  Service: Cardiovascular;  Laterality: N/A;   excision of lymph nodes Bilateral 2014   bilateral under arms.   PARATHYROID IMPLANT REMOVAL     REMOVAL OF A DIALYSIS CATHETER Left 08/05/2020   Procedure: REMOVAL OF A DIALYSIS CATHETER ( FISTULA );  Surgeon: Katha Cabal, MD;  Location: [facility];  Service: Vascular;  Laterality: Left;   SHOULDER ARTHROSCOPY WITH SUBACROMIAL DECOMPRESSION AND OPEN ROTATOR C Right 01/08/2022   Procedure: Right shoulder arthroscopic subscapularis repair, rotator cuff repair, subacromial decompression, and distal clavicle excision;  Surgeon: Leim Fabry, MD;  Location: [facility] OR[facility];  Service: Orthopedics;  Laterality: Right;   TEE WITHOUT CARDIOVERSION N/A 05/01/2022   Procedure: TRANSESOPHAGEAL ECHOCARDIOGRAM (TEE);  Surgeon: Minna Merritts, MD;  Location: [facility];  Service: Cardiovascular;  Laterality: N/A;    Social History Social History   Tobacco Use   Smoking status: Every Day    Packs/day: 0.50    Years: 30.00    Total pack years: 15.00    Types: Cigarettes   Smokeless tobacco:  Never  Vaping Use   Vaping Use: Never used  Substance Use Topics   Alcohol use: No   Drug use: No    Family History Family History  Problem Relation Age of Onset   Diabetes Mother     No Known Allergies   REVIEW OF SYSTEMS (Negative unless checked)  Constitutional: '[]'$ Weight loss  '[]'$ Fever  '[]'$ Chills Cardiac: '[]'$ Chest pain   '[]'$ Chest pressure   '[]'$ Palpitations   '[]'$ Shortness of breath when laying flat   '[]'$ Shortness of breath with exertion. Vascular:  '[]'$ Pain in legs with walking   '[]'$ Pain in legs at rest  '[]'$ History of DVT   '[]'$ Phlebitis   '[]'$ Swelling in legs   '[]'$ Varicose veins   '[]'$ Non-healing ulcers Pulmonary:   '[]'$ Uses home oxygen   '[]'$ Productive cough   '[]'$ Hemoptysis   '[]'$ Wheeze  '[]'$ COPD   '[]'$ Asthma Neurologic:  '[]'$ Dizziness   '[]'$ Seizures   '[]'$ History of stroke   '[]'$ History of TIA  '[]'$ Aphasia   '[]'$ Vissual changes   '[]'$ Weakness or numbness in arm   '[]'$ Weakness or  numbness in leg Musculoskeletal:   '[]'$ Joint swelling   '[]'$ Joint pain   '[]'$ Low back pain Hematologic:  '[]'$ Easy bruising  '[]'$ Easy bleeding   '[]'$ Hypercoagulable state   '[]'$ Anemic Gastrointestinal:  '[]'$ Diarrhea   '[]'$ Vomiting  '[]'$ Gastroesophageal reflux/heartburn   '[]'$ Difficulty swallowing. Genitourinary:  '[x]'$ Chronic kidney disease   '[]'$ Difficult urination  '[]'$ Frequent urination   '[]'$ Blood in urine Skin:  '[]'$ Rashes   '[]'$ Ulcers  Psychological:  '[]'$ History of anxiety   '[]'$  History of major depression.  Physical Examination  There were no vitals filed for this visit. There is no height or weight on file to calculate BMI. Gen: WD/WN, NAD Head: [place]/AT, No temporalis wasting.  Ear/Nose/Throat: Hearing grossly intact, nares w/o erythema or drainage Eyes: PER, EOMI, sclera nonicteric.  Neck: Supple, no gross masses or lesions.  No JVD.  Pulmonary:  Good air movement, no audible wheezing, no use of accessory muscles.  Cardiac: RRR, precordium non-hyperdynamic. Vascular:   *** Vessel Right Left  Radial Palpable Palpable  Brachial Palpable Palpable  Gastrointestinal:  soft, non-distended. No guarding/no peritoneal signs.  Musculoskeletal: M/S 5/5 throughout.  No deformity.  Neurologic: CN 2-12 intact. Pain and light touch intact in extremities.  Symmetrical.  Speech is fluent. Motor exam as listed above. Psychiatric: Judgment intact, Mood & affect appropriate for pt's clinical situation. Dermatologic: No rashes or ulcers noted.  No changes consistent with cellulitis.   CBC Lab Results  Component Value Date   WBC 3.0 (L) 04/13/2022   HGB 12.2 04/13/2022   HCT 38.1 04/13/2022   MCV 84.7 04/13/2022   PLT 134 (L) 04/13/2022    BMET    Component Value Date/Time   NA 139 04/13/2022 0554   NA 139 09/23/2014 1741   K 4.2 04/13/2022 0554   K 4.2 09/23/2014 1741   CL 101 04/13/2022 0554   CL 112 (H) 09/23/2014 1741   CO2 25 04/13/2022 0554   CO2 21 09/23/2014 1741   GLUCOSE 93 04/13/2022 0554   GLUCOSE 82 09/23/2014 1741   BUN 55 (H) 04/13/2022 0554   BUN 18 09/23/2014 1741   CREATININE 9.92 (H) 04/13/2022 0554   CREATININE 1.41 (H) 09/23/2014 1741   CALCIUM 8.7 (L) 04/13/2022 0554   CALCIUM 8.4 (L) 01/03/2019 0243   GFRNONAA 4 (L) 04/13/2022 0554   GFRNONAA 43 (L) 09/23/2014 1741   GFRNONAA 43 (L) 11/20/2012 1129   GFRAA 3 (L) 08/08/2020 1303   GFRAA 52 (L) 09/23/2014 1741   GFRAA 49 (L) 11/20/2012 1129   CrCl cannot be calculated (Patient's most recent lab result is older than the maximum 21 days allowed.).  COAG Lab Results  Component Value Date   INR 1.2 01/08/2022   INR 1.4 (H) 11/03/2020   INR 1.5 (H) 11/02/2020    Radiology PERIPHERAL VASCULAR CATHETERIZATION  Result Date: 06/26/2022 See surgical note for result.    Assessment/Plan There are no diagnoses linked to this encounter.   Hortencia Pilar, MD  07/24/2022 9:57 AM

## 2022-07-26 ENCOUNTER — Ambulatory Visit (INDEPENDENT_AMBULATORY_CARE_PROVIDER_SITE_OTHER): Payer: Medicare Other | Admitting: Vascular Surgery

## 2022-07-26 DIAGNOSIS — I10 Essential (primary) hypertension: Secondary | ICD-10-CM

## 2022-07-26 DIAGNOSIS — K219 Gastro-esophageal reflux disease without esophagitis: Secondary | ICD-10-CM

## 2022-07-26 DIAGNOSIS — J449 Chronic obstructive pulmonary disease, unspecified: Secondary | ICD-10-CM

## 2022-07-26 DIAGNOSIS — I5032 Chronic diastolic (congestive) heart failure: Secondary | ICD-10-CM

## 2022-07-26 DIAGNOSIS — N186 End stage renal disease: Secondary | ICD-10-CM

## 2022-08-09 ENCOUNTER — Ambulatory Visit (INDEPENDENT_AMBULATORY_CARE_PROVIDER_SITE_OTHER): Payer: Medicare Other | Admitting: Nurse Practitioner

## 2022-08-09 ENCOUNTER — Encounter (INDEPENDENT_AMBULATORY_CARE_PROVIDER_SITE_OTHER): Payer: Self-pay | Admitting: Nurse Practitioner

## 2022-08-09 VITALS — BP 116/75 | HR 76 | Resp 16 | Wt 191.0 lb

## 2022-08-09 DIAGNOSIS — I6523 Occlusion and stenosis of bilateral carotid arteries: Secondary | ICD-10-CM

## 2022-08-09 DIAGNOSIS — N186 End stage renal disease: Secondary | ICD-10-CM

## 2022-08-09 DIAGNOSIS — I10 Essential (primary) hypertension: Secondary | ICD-10-CM

## 2022-08-11 ENCOUNTER — Encounter (INDEPENDENT_AMBULATORY_CARE_PROVIDER_SITE_OTHER): Payer: Self-pay | Admitting: Nurse Practitioner

## 2022-08-11 NOTE — Progress Notes (Signed)
Subjective:    Patient ID: Janet Olson, female    DOB: 1970-07-05, 52 y.o.   MRN: [id number] Chief Complaint  Patient presents with   Follow-up    [facility] 4 week follow up     [name] is a 52 year old female that returns today after recent fistulogram.  She notes that her dialysis access has improved.  Her biggest complaint however is of pain and numbness and tingling in the hand itself.  She notes that it tends to be worse during dialysis.  She also notes pain and discomfort in her neck and shoulder area as well.    Review of Systems  Neurological:  Positive for numbness.  All other systems reviewed and are negative.      Objective:   Physical Exam Vitals reviewed.  HENT:     Head: Normocephalic.  Cardiovascular:     Rate and Rhythm: Normal rate.     Pulses:          Radial pulses are 1+ on the right side and 0 on the left side.     Arteriovenous access: Left arteriovenous access is present.    Comments: Good thrill and bruit Pulmonary:     Effort: Pulmonary effort is normal.  Skin:    General: Skin is warm and dry.  Neurological:     Mental Status: She is alert and oriented to person, place, and time.  Psychiatric:        Mood and Affect: Mood normal.        Behavior: Behavior normal.        Thought Content: Thought content normal.        Judgment: Judgment normal.     BP 116/75 (BP Location: Right Arm)   Pulse 76   Resp 16   Wt 191 lb (86.6 kg)   LMP 03/03/2018 (Approximate)   BMI 30.83 kg/m   Past Medical History:  Diagnosis Date   (HFpEF) heart failure with preserved ejection fraction ([place])    a. 2015 Echo: Nl EF. Mild to mod LVH; b. 04/2017 Echo: EF 55-60% Gr1 DD; c. 12/2018 Echo: EF 55-60%; d. 02/2020 Echo: EF 50-55%; e. 10/2020 Echo: EF 55-60%, mild LVH, mildly enlarged RV, sev elev RVSP, BAE, mild MR; 12/2020 Echo: EF 65%, sev dil RA, dil LA, RVSP 29.66mHg, MR/PR.   Abdominal ascites    Acute hypoxemic respiratory failure ([facility] 01/03/2019    Acute renal failure (ARF) ([facility] 05/17/2017   Acute respiratory failure ([facility] 09/25/2018   Anemia    Anxiety    Arthritis    Asthma    Atrial flutter ([facility]    a. Dx 03/2022-->CHA2DS2VASc = 3-->eliquis.   Cardiomegaly    COPD (chronic obstructive pulmonary disease) (HCC)    Depression    [facility]ialysis [place] ([facility]    MON, WED , FRI   Dyspnea    ESRD (end stage renal disease) (HCC)    GERD (gastroesophageal reflux disease)    Headache    Migraines   Hypertension    Nonrheumatic tricuspid (valve) insufficiency    Pericardial effusion 01/26/2019   Respiratory failure with hypoxia ([facility] 04/21/2018   Ulcer    gastric   Vitamin D deficiency     Social History   Socioeconomic History   Marital status: Single    Spouse name: Not on file   Number of children: Not on file   Years of education: Not on file   Highest education level: Not on file  Occupational History  Not on file  Tobacco Use   Smoking status: Every Day    Packs/day: 0.50    Years: 30.00    Total pack years: 15.00    Types: Cigarettes   Smokeless tobacco: Never  Vaping Use   Vaping Use: Never used  Substance and Sexual Activity   Alcohol use: No   Drug use: No   Sexual activity: Not Currently  Other Topics Concern   Not on file  Social History Narrative   Not on file   Social Determinants of Health   Financial Resource Strain: Not on file  Food Insecurity: Not on file  Transportation Needs: Not on file  Physical Activity: Not on file  Stress: Not on file  Social Connections: Not on file  Intimate Partner Violence: Not on file    Past Surgical History:  Procedure Laterality Date   A/V FISTULAGRAM Left 04/17/2018   Procedure: A/V FISTULAGRAM;  Surgeon: Algernon Huxley, MD;  Location: [place] [facility];  Service: Cardiovascular;  Laterality: Left;   A/V FISTULAGRAM Left 05/27/2018   Procedure: A/V FISTULAGRAM;  Surgeon: Katha Cabal, MD;  Location: [place] [facility];  Service: Cardiovascular;   Laterality: Left;   A/V FISTULAGRAM Left 07/05/2020   Procedure: A/V FISTULAGRAM;  Surgeon: Katha Cabal, MD;  Location: [place] [facility];  Service: Cardiovascular;  Laterality: Left;   A/V FISTULAGRAM Left 06/26/2022   Procedure: A/V Fistulagram;  Surgeon: Katha Cabal, MD;  Location: [place] [facility];  Service: Cardiovascular;  Laterality: Left;   AV FISTULA PLACEMENT Left 08/08/2017   Procedure: ARTERIOVENOUS (AV) FISTULA CREATION ( BRACHIOCEPHALIC );  Surgeon: Algernon Huxley, MD;  Location: [facility];  Service: Vascular;  Laterality: Left;   AV FISTULA PLACEMENT Left 08/05/2020   Procedure: INSERTION OF ARTERIOVENOUS (AV) GRAFT ARM(BRACHIAL AXILLARY);  Surgeon: Katha Cabal, MD;  Location: [facility];  Service: Vascular;  Laterality: Left;   CARDIOVERSION N/A 05/01/2022   Procedure: CARDIOVERSION;  Surgeon: Minna Merritts, MD;  Location: [facility];  Service: Cardiovascular;  Laterality: N/A;   CESAREAN SECTION     x 4 (1988, 1991, 1997, 2000 )   COLONOSCOPY WITH PROPOFOL N/A 04/24/2018   Procedure: COLONOSCOPY WITH PROPOFOL;  Surgeon: Lin Landsman, MD;  Location: [facility] ENDOSCOPY;  Service: Gastroenterology;  Laterality: N/A;   DIALYSIS/PERMA CATHETER INSERTION N/A 07/16/2017   Procedure: Dialysis/Perma Catheter Insertion;  Surgeon: Katha Cabal, MD;  Location: [place] [facility];  Service: Cardiovascular;  Laterality: N/A;   DIALYSIS/PERMA CATHETER REMOVAL N/A 10/29/2017   Procedure: DIALYSIS/PERMA CATHETER REMOVAL;  Surgeon: Katha Cabal, MD;  Location: [place] [facility];  Service: Cardiovascular;  Laterality: N/A;   DIALYSIS/PERMA CATHETER REMOVAL N/A 11/03/2020   Procedure: DIALYSIS/PERMA CATHETER REMOVAL;  Surgeon: Algernon Huxley, MD;  Location: [place] [facility];  Service: Cardiovascular;  Laterality: N/A;   excision of lymph nodes Bilateral 2014   bilateral under arms.   PARATHYROID IMPLANT REMOVAL     REMOVAL OF A DIALYSIS CATHETER Left 08/05/2020    Procedure: REMOVAL OF A DIALYSIS CATHETER ( FISTULA );  Surgeon: Katha Cabal, MD;  Location: [facility];  Service: Vascular;  Laterality: Left;   SHOULDER ARTHROSCOPY WITH SUBACROMIAL DECOMPRESSION AND OPEN ROTATOR C Right 01/08/2022   Procedure: Right shoulder arthroscopic subscapularis repair, rotator cuff repair, subacromial decompression, and distal clavicle excision;  Surgeon: Leim Fabry, MD;  Location: [facility] ORS;  Service: Orthopedics;  Laterality: Right;   TEE WITHOUT CARDIOVERSION N/A  05/01/2022   Procedure: TRANSESOPHAGEAL ECHOCARDIOGRAM (TEE);  Surgeon: Minna Merritts, MD;  Location: [facility];  Service: Cardiovascular;  Laterality: N/A;    Family History  Problem Relation Age of Onset   Diabetes Mother     No Known Allergies     Latest Ref Rng & Units 04/13/2022    5:54 AM 04/12/2022   11:20 AM 04/12/2022   12:57 AM  CBC  WBC 4.0 - 10.5 K/uL 3.0  3.2  4.0   Hemoglobin 12.0 - 15.0 g/dL 12.2  11.9  13.5   Hematocrit 36.0 - 46.0 % 38.1  36.2  41.0   Platelets 150 - 400 K/uL 134  137  170       CMP     Component Value Date/Time   NA 139 04/13/2022 0554   NA 139 09/23/2014 1741   K 4.2 04/13/2022 0554   K 4.2 09/23/2014 1741   CL 101 04/13/2022 0554   CL 112 (H) 09/23/2014 1741   CO2 25 04/13/2022 0554   CO2 21 09/23/2014 1741   GLUCOSE 93 04/13/2022 0554   GLUCOSE 82 09/23/2014 1741   BUN 55 (H) 04/13/2022 0554   BUN 18 09/23/2014 1741   CREATININE 9.92 (H) 04/13/2022 0554   CREATININE 1.41 (H) 09/23/2014 1741   CALCIUM 8.7 (L) 04/13/2022 0554   CALCIUM 8.4 (L) 01/03/2019 0243   PROT 8.6 (H) 04/11/2022 1559   PROT 7.9 05/19/2021 0932   PROT 7.5 09/23/2014 1741   ALBUMIN 3.2 (L) 04/12/2022 0930   ALBUMIN 3.9 05/19/2021 0932   ALBUMIN 3.2 (L) 09/23/2014 1741   AST 22 04/11/2022 1559   AST 15 09/23/2014 1741   ALT 16 04/11/2022 1559   ALT 32 09/23/2014 1741   ALKPHOS 203 (H) 04/11/2022 1559   ALKPHOS 65 09/23/2014 1741   BILITOT 1.2 04/11/2022 1559    BILITOT 0.3 05/19/2021 0932   BILITOT 0.2 09/23/2014 1741   GFRNONAA 4 (L) 04/13/2022 0554   GFRNONAA 43 (L) 09/23/2014 1741   GFRNONAA 43 (L) 11/20/2012 1129   GFRAA 3 (L) 08/08/2020 1303   GFRAA 52 (L) 09/23/2014 1741   GFRAA 49 (L) 11/20/2012 1129     No results found.     Assessment & Plan:   1. ESRD (end stage renal disease) ([place]) Currently the patient has adequate dialysis access however there is concern that she may be having steal syndrome.  It is also possible based on the description of her symptoms this may be related to musculoskeletal issues.  We will have the patient return for an HDA as well as stool study.  2. Benign essential HTN Continue antihypertensive medications as already ordered, these medications have been reviewed and there are no changes at this time.    Current Outpatient Medications on File Prior to Visit  Medication Sig Dispense Refill   acetaminophen (TYLENOL) 500 MG tablet Take 2 tablets (1,000 mg total) by mouth every 8 (eight) hours. 90 tablet 2   albuterol (PROVENTIL) (2.5 MG/3ML) 0.083% nebulizer solution Take 2.5 mg by nebulization every 6 (six) hours as needed for wheezing or shortness of breath.      albuterol (VENTOLIN HFA) 108 (90 Base) MCG/ACT inhaler Inhale 2 puffs into the lungs every 6 (six) hours as needed for wheezing or shortness of breath.      amLODipine (NORVASC) 10 MG tablet Take 10 mg by mouth daily.     apixaban (ELIQUIS) 5 MG TABS tablet Take 1 tablet (5 mg total) by mouth  2 (two) times daily. 60 tablet 11   calcium acetate (PHOSLO) 667 MG capsule Take 2 capsules (1,334 mg total) by mouth 3 (three) times daily with meals. 60 capsule 0   Cholecalciferol 125 MCG (5000 UT) capsule Take by mouth.     cinacalcet (SENSIPAR) 30 MG tablet Take 1 tablet (30 mg total) by mouth every Monday, Wednesday, and Friday. 60 tablet 0   DULERA 200-5 MCG/ACT AERO Inhale 2 puffs into the lungs 2 (two) times daily.  0   ferric citrate (AURYXIA) 1  GM 210 MG(Fe) tablet Take 210 mg by mouth See admin instructions. Take 210 mg with each meal     furosemide (LASIX) 40 MG tablet Take 40 mg by mouth daily.     gabapentin (NEURONTIN) 300 MG capsule Take by mouth.     Heparin Sodium, Porcine, (HEPARIN LOCK FLUSH IJ) Inject 1 Dose as directed every Monday, Wednesday, and Friday. At dialysis     hydrOXYzine (ATARAX) 25 MG tablet Take by mouth.     hydrOXYzine (ATARAX/VISTARIL) 25 MG tablet Take 25 mg by mouth in the morning.     lidocaine-prilocaine (EMLA) cream 1 application as needed (prior to port being accessed).     lumateperone tosylate (CAPLYTA) 42 MG capsule Take 42 mg by mouth at bedtime.     Metoprolol Tartrate 75 MG TABS Take by mouth.     multivitamin (RENA-VIT) TABS tablet Take 1 tablet by mouth daily.   1   pantoprazole (PROTONIX) 40 MG tablet Take 1 tablet (40 mg total) by mouth daily. 30 tablet 0   polyethylene glycol powder (GLYCOLAX/MIRALAX) 17 GM/SCOOP powder Take 255 g by mouth daily. 255 g 3   QUEtiapine (SEROQUEL) 100 MG tablet Take 100-200 mg by mouth at bedtime as needed (sleep).     QUEtiapine (SEROQUEL) 25 MG tablet Take 50 mg by mouth at bedtime as needed (sleep).     torsemide (DEMADEX) 100 MG tablet Take 1 tablet (100 mg total) by mouth daily. 30 tablet 0   benzonatate (TESSALON) 200 MG capsule Take 400 mg by mouth 3 (three) times daily as needed for cough. (Patient not taking: Reported on 06/26/2022)     DULoxetine (CYMBALTA) 30 MG capsule Take 1 capsule (30 mg total) by mouth daily. (Patient not taking: Reported on 06/26/2022) 30 capsule 3   linaclotide (LINZESS) 72 MCG capsule Take 72 mcg by mouth every evening. (Patient not taking: Reported on 06/26/2022)     No current facility-administered medications on file prior to visit.    There are no Patient Instructions on file for this visit. No follow-ups on file.   Kris Hartmann, NP

## 2022-09-06 ENCOUNTER — Other Ambulatory Visit (INDEPENDENT_AMBULATORY_CARE_PROVIDER_SITE_OTHER): Payer: Self-pay | Admitting: Vascular Surgery

## 2022-09-06 DIAGNOSIS — N186 End stage renal disease: Secondary | ICD-10-CM

## 2022-09-07 ENCOUNTER — Ambulatory Visit (INDEPENDENT_AMBULATORY_CARE_PROVIDER_SITE_OTHER): Payer: 59

## 2022-09-07 ENCOUNTER — Ambulatory Visit (INDEPENDENT_AMBULATORY_CARE_PROVIDER_SITE_OTHER): Payer: 59 | Admitting: Nurse Practitioner

## 2022-09-07 ENCOUNTER — Encounter (INDEPENDENT_AMBULATORY_CARE_PROVIDER_SITE_OTHER): Payer: Self-pay | Admitting: Nurse Practitioner

## 2022-09-07 VITALS — BP 133/77 | HR 70 | Resp 17 | Ht 66.0 in | Wt 195.0 lb

## 2022-09-07 DIAGNOSIS — J449 Chronic obstructive pulmonary disease, unspecified: Secondary | ICD-10-CM

## 2022-09-07 DIAGNOSIS — N186 End stage renal disease: Secondary | ICD-10-CM

## 2022-09-07 DIAGNOSIS — I10 Essential (primary) hypertension: Secondary | ICD-10-CM

## 2022-09-27 ENCOUNTER — Encounter (INDEPENDENT_AMBULATORY_CARE_PROVIDER_SITE_OTHER): Payer: Self-pay | Admitting: Nurse Practitioner

## 2022-09-27 NOTE — Progress Notes (Signed)
Subjective:    Patient ID: Janet Olson, female    DOB: [birth year], 52 y.o.   MRN: [id number] No chief complaint on file.   Janet Olson is a 52 year old female that returns today after recent fistulogram.  She notes that her dialysis access has improved.  Her biggest complaint however is of pain and numbness and tingling in the hand itself.  She notes that it tends to be worse during dialysis.  She also notes pain and discomfort in her neck and shoulder area as well.  Today she notes that her dialysis access has been functioning well and that the pain and numbness that she prior to has not been affecting her.  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  There are some elevated velocities near the venous anastomosis.  Flow volume is 2207    Review of Systems  All other systems reviewed and are negative.      Objective:   Physical Exam Vitals reviewed.  HENT:     Head: Normocephalic.  Cardiovascular:     Rate and Rhythm: Normal rate.     Pulses: Normal pulses.  Pulmonary:     Effort: Pulmonary effort is normal.  Skin:    General: Skin is warm and dry.  Neurological:     Mental Status: She is alert and oriented to person, place, and time.  Psychiatric:        Mood and Affect: Mood normal.        Behavior: Behavior normal.        Thought Content: Thought content normal.        Judgment: Judgment normal.     BP 133/77 (BP Location: Right Arm)   Pulse 70   Resp 17   Ht '5\' 6"'$  (1.676 m)   Wt 195 lb (88.5 kg)   LMP 03/03/2018 (Approximate)   BMI 31.47 kg/m   Past Medical History:  Diagnosis Date   (HFpEF) heart failure with preserved ejection fraction ([place])    a. 2015 Echo: Nl EF. Mild to mod LVH; b. 04/2017 Echo: EF 55-60% Gr1 DD; c. 12/2018 Echo: EF 55-60%; d. 02/2020 Echo: EF 50-55%; e. 10/2020 Echo: EF 55-60%, mild LVH, mildly enlarged RV, sev elev RVSP, BAE, mild MR; 12/2020 Echo: EF 65%, sev dil RA, dil  LA, RVSP 29.49mHg, MR/PR.   Abdominal ascites    Ac[place] hypoxemic respiratory [place] ([facility] 01/03/2019   Acute renal failure (ARF) ([facility] 05/17/2017   Acute respiratory failure ([facility] 09/25/2018   Anemia    Anxiety    Arthritis    Asthma    Atrial flutter ([facility]    a. Dx 03/2022-->CHA2DS2VASc = 3-->eliquis.   Cardiomegaly    COPD (chronic obstructive pulmonary disease) ([facility])    Depression    [facility], W[facility] , FRI   Dyspnea    ESRD (end stage renal disease) (H[facility])    GERD (gastroesophageal reflux disease)    Headache    Migraines   Hypertension    Nonrheumatic tricuspid (valve) insufficiency    Pericardial effusion 01/26/2019   Respiratory failure with hypoxia ([facility] 04/21/2018   Ulcer    gastric   Vitamin D deficiency     Social History   Socioeconomic History   Marital status: Single    Spouse name: Not on file   Number of children: Not on file   Years of education: Not on file   Highest education level: Not  on file  Occupational History   Not on file  Tobacco Use   Smoking status: Every Day    Packs/day: 0.50    Years: 30.00    Total pack years: 15.00    Types: Cigarettes   Smokeless tobacco: Never  Vaping Use   Vaping Use: Never used  Substance and Sexual Activity   Alcohol use: No   Drug use: No   Sexual activity: Not Currently  Other Topics Concern   Not on file  Social History Narrative   Not on file   Social Determinants of Health   Financial Resource Strain: Not on file  Food Insecurity: Not on file  Transportation Needs: Not on file  Physical Activity: Not on file  Stress: Not on file  Social Connections: Not on file  Intimate Partner Violence: Not on file    Past Surgical History:  Procedure Laterality Date   A/V FISTULAGRAM Left 04/17/2018   Procedure: A/V FISTULAGRAM;  Surgeon: Algernon Huxley, MD;  Location: [facility];  Service: Cardiovascular;  Laterality: Left;   A/V FISTULAGRAM Left 05/27/2018   Procedure: A/V  FISTULAGRAM;  Surgeon: Katha Cabal, MD;  Location: [facility];  Service: Cardiovascular;  Laterality: Left;   A/V FISTULAGRAM Left 07/05/2020   Procedure: A/V FISTULAGRAM;  Surgeon: Katha Cabal, MD;  Location: [facility];  Service: Cardiovascular;  Laterality: Left;   A/V FISTULAGRAM Left 06/26/2022   Procedure: A/V Fistulagram;  Surgeon: Katha Cabal, MD;  Location: [facility];  Service: Cardiovascular;  Laterality: Left;   AV FISTULA PLACEMENT Left 08/08/2017   Procedure: ARTERIOVENOUS (AV) FISTULA CREATION ( BRACHIOCEPHALIC );  Surgeon: Algernon Huxley, MD;  Location: [facility];  Service: Vascular;  Laterality: Left;   AV FISTULA PLACEMENT Left 08/05/2020   Procedure: INSERTION OF ARTERIOVENOUS (AV) GRAFT ARM(BRACHIAL AXILLARY);  Surgeon: Katha Cabal, MD;  Location: [facility];  Service: Vascular;  Laterality: Left;   CARDIOVERSION N/A 05/01/2022   Procedure: CARDIOVERSION;  Surgeon: Minna Merritts, MD;  Location: [facility];  Service: Cardiovascular;  Laterality: N/A;   CESAREAN SECTION     x 4 (1988, 1991, 1997, 2000 )   COLONOSCOPY WITH PROPOFOL N/A 04/24/2018   Procedure: COLONOSCOPY WITH PROPOFOL;  Surgeon: Lin Landsman, MD;  Location: [facility];  Service: Gastroenterology;  Laterality: N/A;   DIALYSIS/PERMA CATHETER INSERTION N/A 07/16/2017   Procedure: Dialysis/Perma Catheter Insertion;  Surgeon: Katha Cabal, MD;  Location: [facility];  Service: Cardiovascular;  Laterality: N/A;   DIALYSIS/PERMA CATHETER REMOVAL N/A 10/29/2017   Procedure: DIALYSIS/PERMA CATHETER REMOVAL;  Surgeon: Katha Cabal, MD;  Location: [place] [facility];  Service: Cardiovascular;  Laterality: N/A;   DIALYSIS/PERMA CATHETER REMOVAL N/A 11/03/2020   Procedure: DIALYSIS/PERMA CATHETER REMOVAL;  Surgeon: Algernon Huxley, MD;  Location: [place] [facility];  Service: Cardiovascular;  Laterality: N/A;   excision of lymph nodes Bilateral 2014    bilateral under arms.   PARATHYROID IMPLANT REMOVAL     REMOVAL OF A DIALYSIS CATHETER Left 08/05/2020   Procedure: REMOVAL OF A DIALYSIS CATHETER ( FISTULA );  Surgeon: Katha Cabal, MD;  Location: [facility];  Service: Vascular;  Laterality: Left;   SHOULDER ARTHROSCOPY WITH SUBACROMIAL DECOMPRESSION AND OPEN ROTATOR C Right 01/08/2022   Procedure: Right shoulder arthroscopic subscapularis repair, rotator cuff repair, subacromial decompression, and distal clavicle excision;  Surgeon: Leim Fabry, MD;  Location: [facility];  Service: Orthopedics;  Laterality:  Right;   TEE WITHOUT CARDIOVERSION N/A 05/01/2022   Procedure: TRANSESOPHAGEAL ECHOCARDIOGRAM (TEE);  Surgeon: Minna Merritts, MD;  Location: [facility];  Service: Cardiovascular;  Laterality: N/A;    Family History  Problem Relation Age of Onset   Diabetes Mother     No Known Allergies     Latest Ref Rng & Units 04/13/2022    5:54 AM 04/12/2022   11:20 AM 04/12/2022   12:57 AM  CBC  WBC 4.0 - 10.5 K/uL 3.0  3.2  4.0   Hemoglobin 12.0 - 15.0 g/dL 12.2  11.9  13.5   Hematocrit 36.0 - 46.0 % 38.1  36.2  41.0   Platelets 150 - 400 K/uL 134  137  170       CMP     Component Value Date/Time   NA 139 04/13/2022 0554   NA 139 09/23/2014 1741   K 4.2 04/13/2022 0554   K 4.2 09/23/2014 1741   CL 101 04/13/2022 0554   CL 112 (H) 09/23/2014 1741   CO2 25 04/13/2022 0554   CO2 21 09/23/2014 1741   GLUCOSE 93 04/13/2022 0554   GLUCOSE 82 09/23/2014 1741   BUN 55 (H) 04/13/2022 0554   BUN 18 09/23/2014 1741   CREATININE 9.92 (H) 04/13/2022 0554   CREATININE 1.41 (H) 09/23/2014 1741   CALCIUM 8.7 (L) 04/13/2022 0554   CALCIUM 8.4 (L) 01/03/2019 0243   PROT 8.6 (H) 04/11/2022 1559   PROT 7.9 05/19/2021 0932   PROT 7.5 09/23/2014 1741   ALBUMIN 3.2 (L) 04/12/2022 0930   ALBUMIN 3.9 05/19/2021 0932   ALBUMIN 3.2 (L) 09/23/2014 1741   AST 22 04/11/2022 1559   AST 15 09/23/2014 1741   ALT 16 04/11/2022 1559   ALT 32  09/23/2014 1741   ALKPHOS 203 (H) 04/11/2022 1559   ALKPHOS 65 09/23/2014 1741   BILITOT 1.2 04/11/2022 1559   BILITOT 0.3 05/19/2021 0932   BILITOT 0.2 09/23/2014 1741   GFRNONAA 4 (L) 04/13/2022 0554   GFRNONAA 43 (L) 09/23/2014 1741   GFRNONAA 43 (L) 11/20/2012 1129   GFRAA 3 (L) 08/08/2020 1303   GFRAA 52 (L) 09/23/2014 1741   GFRAA 49 (L) 11/20/2012 1129     No results found.     Assessment & Plan:   1. 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  This was noted on her recent fistulogram and it is actually improved in size from her most recent HDA.  However because of the elevated velocities we will have the patient follow-up in 3 months versus 6 months to ensure that patency is maintained.  Patient is advised to follow-up sooner if needed. - VAS US DUPLEX DIALYSIS ACCESS (AVF, AVG)  2. Benign essential HTN Continue antihypertensive medications as already ordered, these medications have been reviewed and there are no changes at this time.   3. Chronic obstructive pulmonary disease, unspecified COPD type ([place]) Continue pulmonary medications and aerosols as already ordered, these medications have been reviewed and there are no changes at this time.     Current Outpatient Medications on File Prior to Visit  Medication Sig Dispense Refill   acetaminophen (TYLENOL) 500 MG tablet Take 2 tablets (1,000 mg total) by mouth every 8 (eight) hours. 90 tablet 2   albuterol (PROVENTIL) (2.5 MG/3ML) 0.083% nebulizer solution Take 2.5 mg by nebulization every 6 (six) hours as needed for wheezing or shortness of breath.      albuterol (VENTOLIN HFA) 108 (90  Base) MCG/ACT inhaler Inhale 2 puffs into the lungs every 6 (six) hours as needed for wheezing or shortness of breath.      amLODipine (NORVASC) 10 MG tablet Take 10 mg by mouth daily.     apixaban (ELIQUIS) 5 MG TABS tablet Take 1 tablet (5 mg total) by mouth 2 (two) times daily. 60 tablet 11    benzonatate (TESSALON) 200 MG capsule Take 400 mg by mouth 3 (three) times daily as needed for cough.     CAPLYTA 10.5 MG CAPS Take 1 capsule by mouth daily.     Cholecalciferol 125 MCG (5000 UT) capsule Take by mouth.     cinacalcet (SENSIPAR) 30 MG tablet Take 1 tablet (30 mg total) by mouth every Monday, Wednesday, and Friday. 60 tablet 0   DULERA 200-5 MCG/ACT AERO Inhale 2 puffs into the lungs 2 (two) times daily.  0   Heparin Sodium, Porcine, (HEPARIN LOCK FLUSH IJ) Inject 1 Dose as directed every Monday, Wednesday, and Friday. At dialysis     hydrOXYzine (ATARAX) 25 MG tablet Take by mouth.     lidocaine-prilocaine (EMLA) cream 1 application as needed (prior to port being accessed).     linaclotide (LINZESS) 72 MCG capsule Take 72 mcg by mouth every evening.     lumateperone tosylate (CAPLYTA) 42 MG capsule Take 42 mg by mouth at bedtime.     Metoprolol Tartrate 75 MG TABS Take by mouth.     multivitamin (RENA-VIT) TABS tablet Take 1 tablet by mouth daily.   1   pantoprazole (PROTONIX) 40 MG tablet Take 1 tablet (40 mg total) by mouth daily. 30 tablet 0   polyethylene glycol powder (GLYCOLAX/MIRALAX) 17 GM/SCOOP powder Take 255 g by mouth daily. 255 g 3   QUEtiapine (SEROQUEL) 100 MG tablet Take 100-200 mg by mouth at bedtime as needed (sleep).     QUEtiapine (SEROQUEL) 25 MG tablet Take 50 mg by mouth at bedtime as needed (sleep).     torsemide (DEMADEX) 100 MG tablet Take 1 tablet (100 mg total) by mouth daily. 30 tablet 0   No current facility-administered medications on file prior to visit.    There are no Patient Instructions on file for this visit. No follow-ups on file.   Kris Hartmann, NP

## 2022-10-03 IMAGING — CT CT HEAD W/O CM
5 of 8 series · 17 of 47 positions shown, 18 images · non-contrast
Comparison: CT head dated July 17, 2021

CLINICAL DATA: Altered mental status.



[Series 2: head bone · axial · 0.41mm/px · z∈[-130,-8]mm · 8 of 77 slices shown]
[im 8/77  bone]
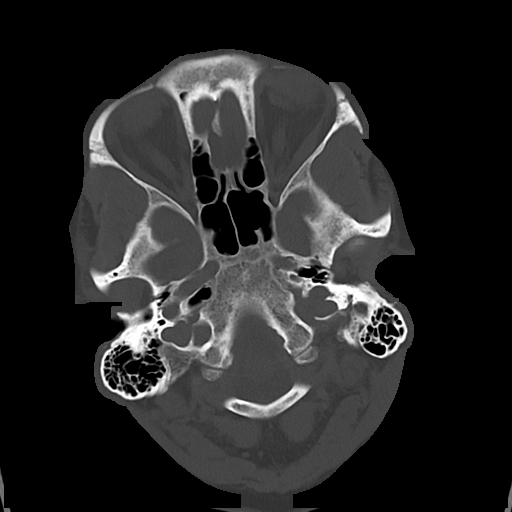
[im 16/77  bone]
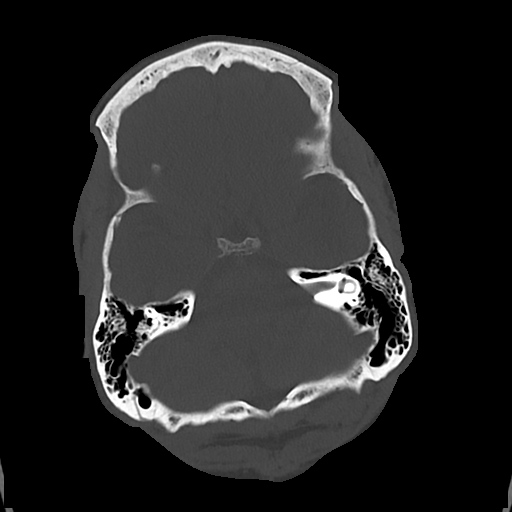
[im 23/77  bone]
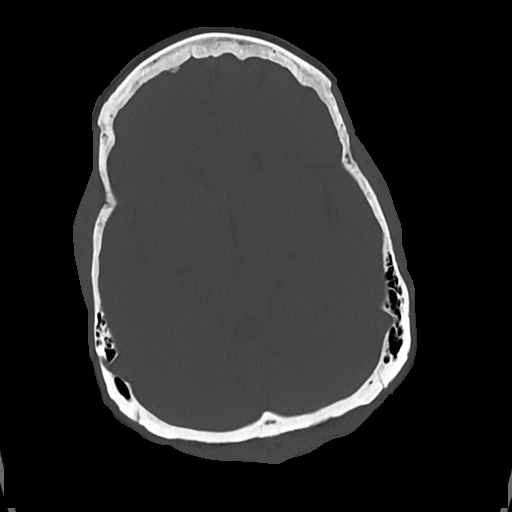
[im 31/77  bone]
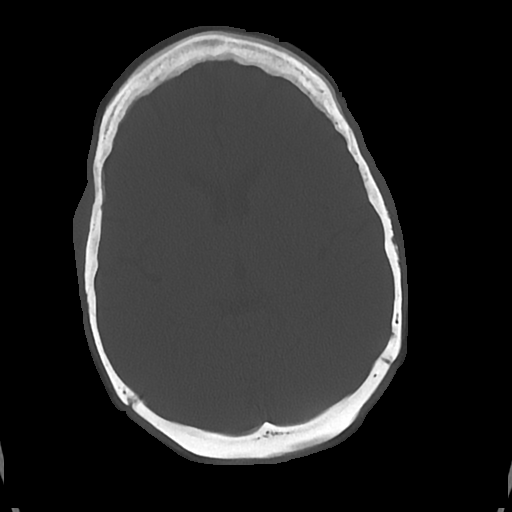
[im 46/77  bone]
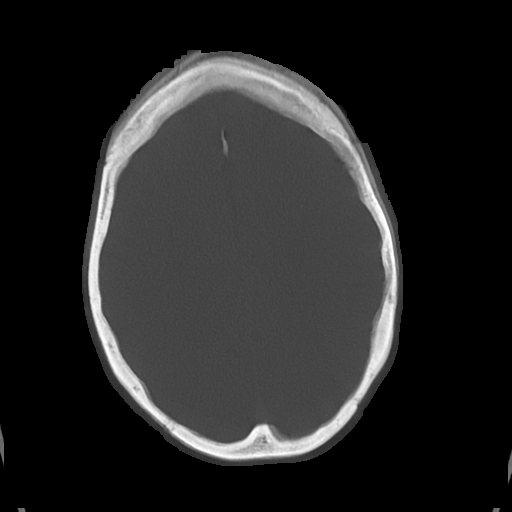
[im 54/77  bone]
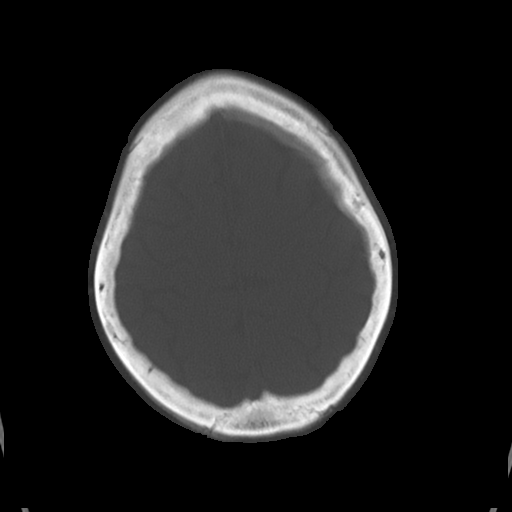
[im 61/77  bone]
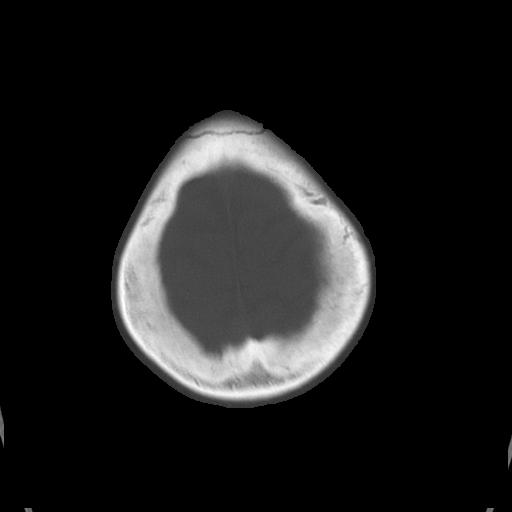
[im 69/77  bone]
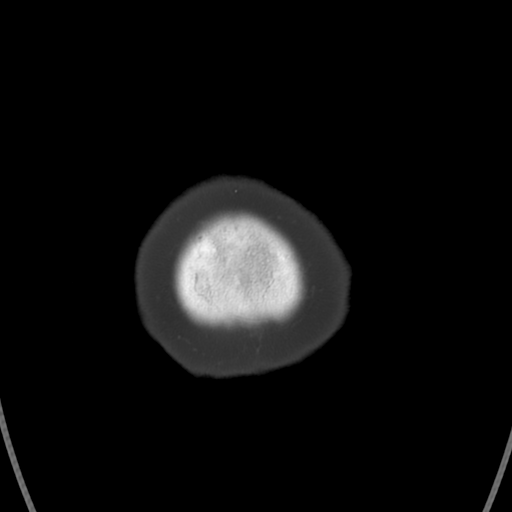

[Series 3: cor soft · coronal · 0.33mm/px · 3 of 64 slices shown]
[im 16/64  brain]
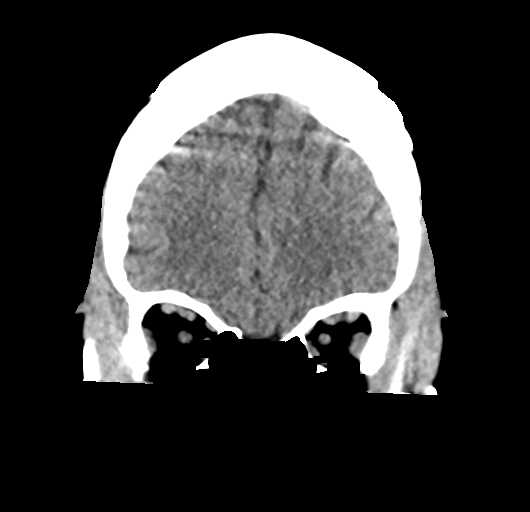
[im 32/64  brain]
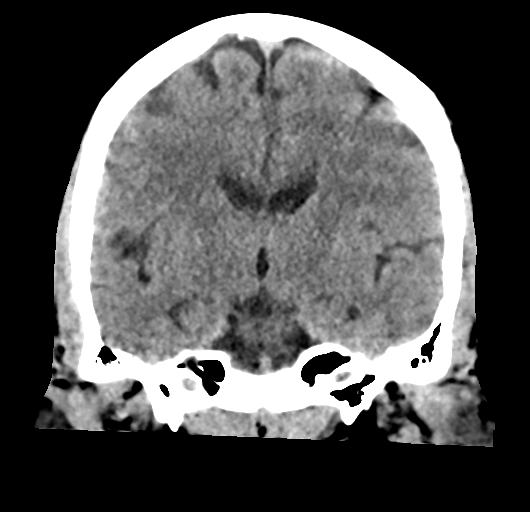
[im 48/64  brain]
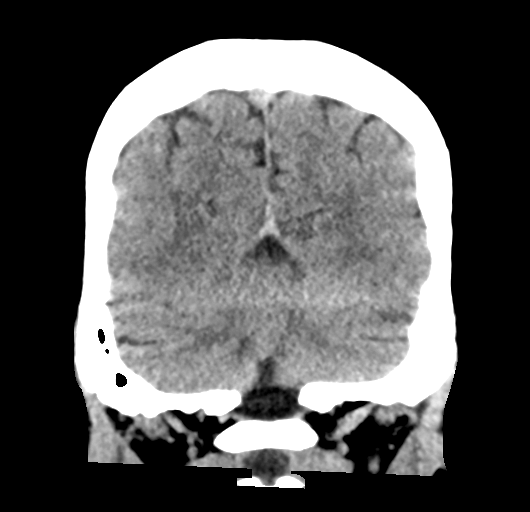

[Series 5: head wo · axial · 0.41mm/px · z∈[-94,-44]mm · 2 of 31 slices shown, 3 images (1 of 2)]
[im 11/31  brain]
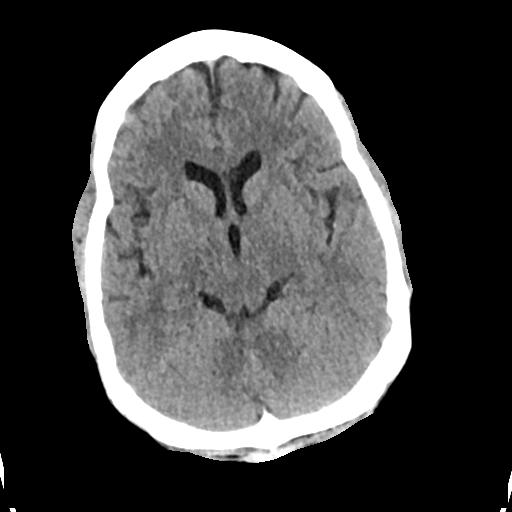
[im 11/31  bone]
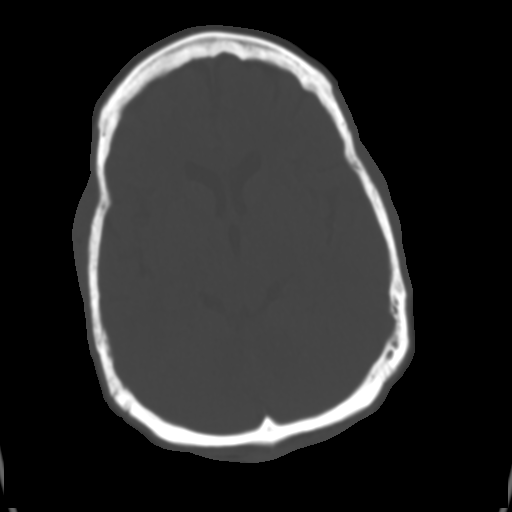
[im 21/31  brain]
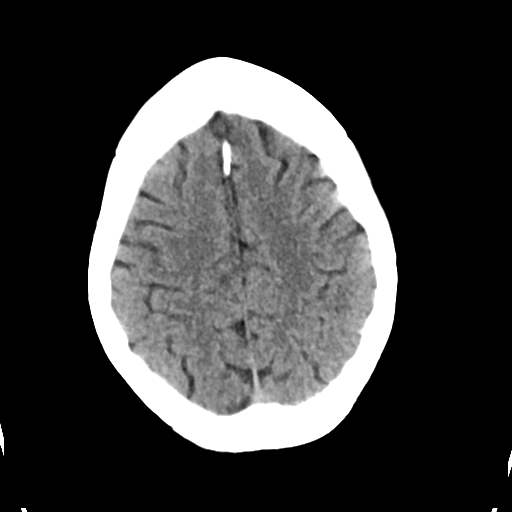

[Series 6: head wo · axial · 0.41mm/px · z∈[-94,-44]mm · 2 of 31 slices shown (2 of 2)]
[im 11/31  brain]
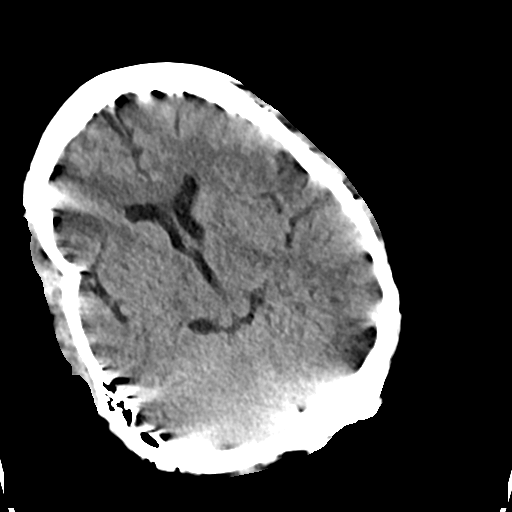
[im 21/31  brain]
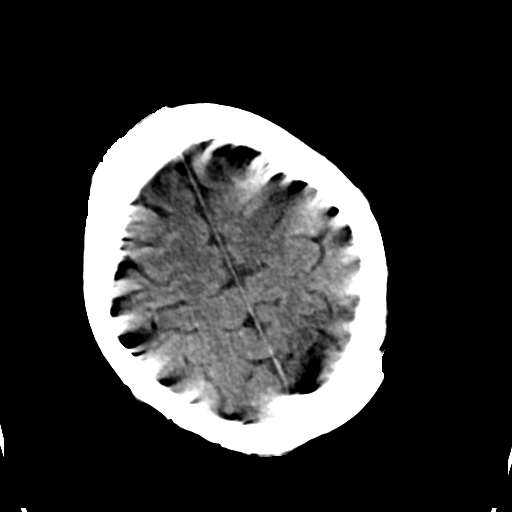

[Series 9: sag soft · sagittal · 0.32mm/px · 2 of 77 slices shown]
[im 26/77  brain]
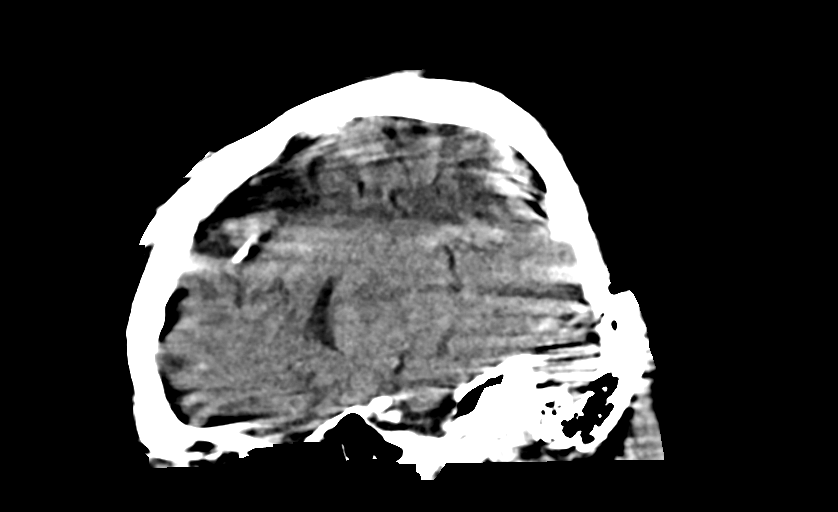
[im 51/77  brain]
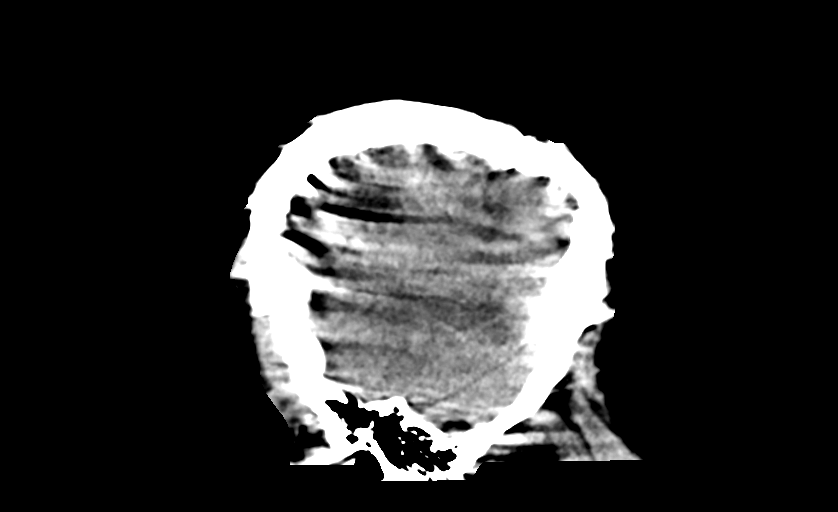

[17 of 47 positions shown; findings below may reference images not displayed]

FINDINGS: Brain: No evidence of acute infarction, hemorrhage, hydrocephalus,
extra-axial collection or mass lesion/mass effect. Partially empty
sella.

Vascular: No hyperdense vessel or unexpected calcification.

Skull: Normal. Negative for fracture or focal lesion.

Sinuses/Orbits: No acute finding.

Other: None.
IMPRESSION: No acute intracranial abnormality.

## 2022-10-03 IMAGING — DX DG CHEST 1V PORT
1 series · 1 of 1 positions shown · non-contrast
Comparison: CT chest 10/06/2021, chest x-ray 10/05/2021

CLINICAL DATA: Altered mental status

EXAM:
PORTABLE CHEST 1 VIEW

[chest ap]
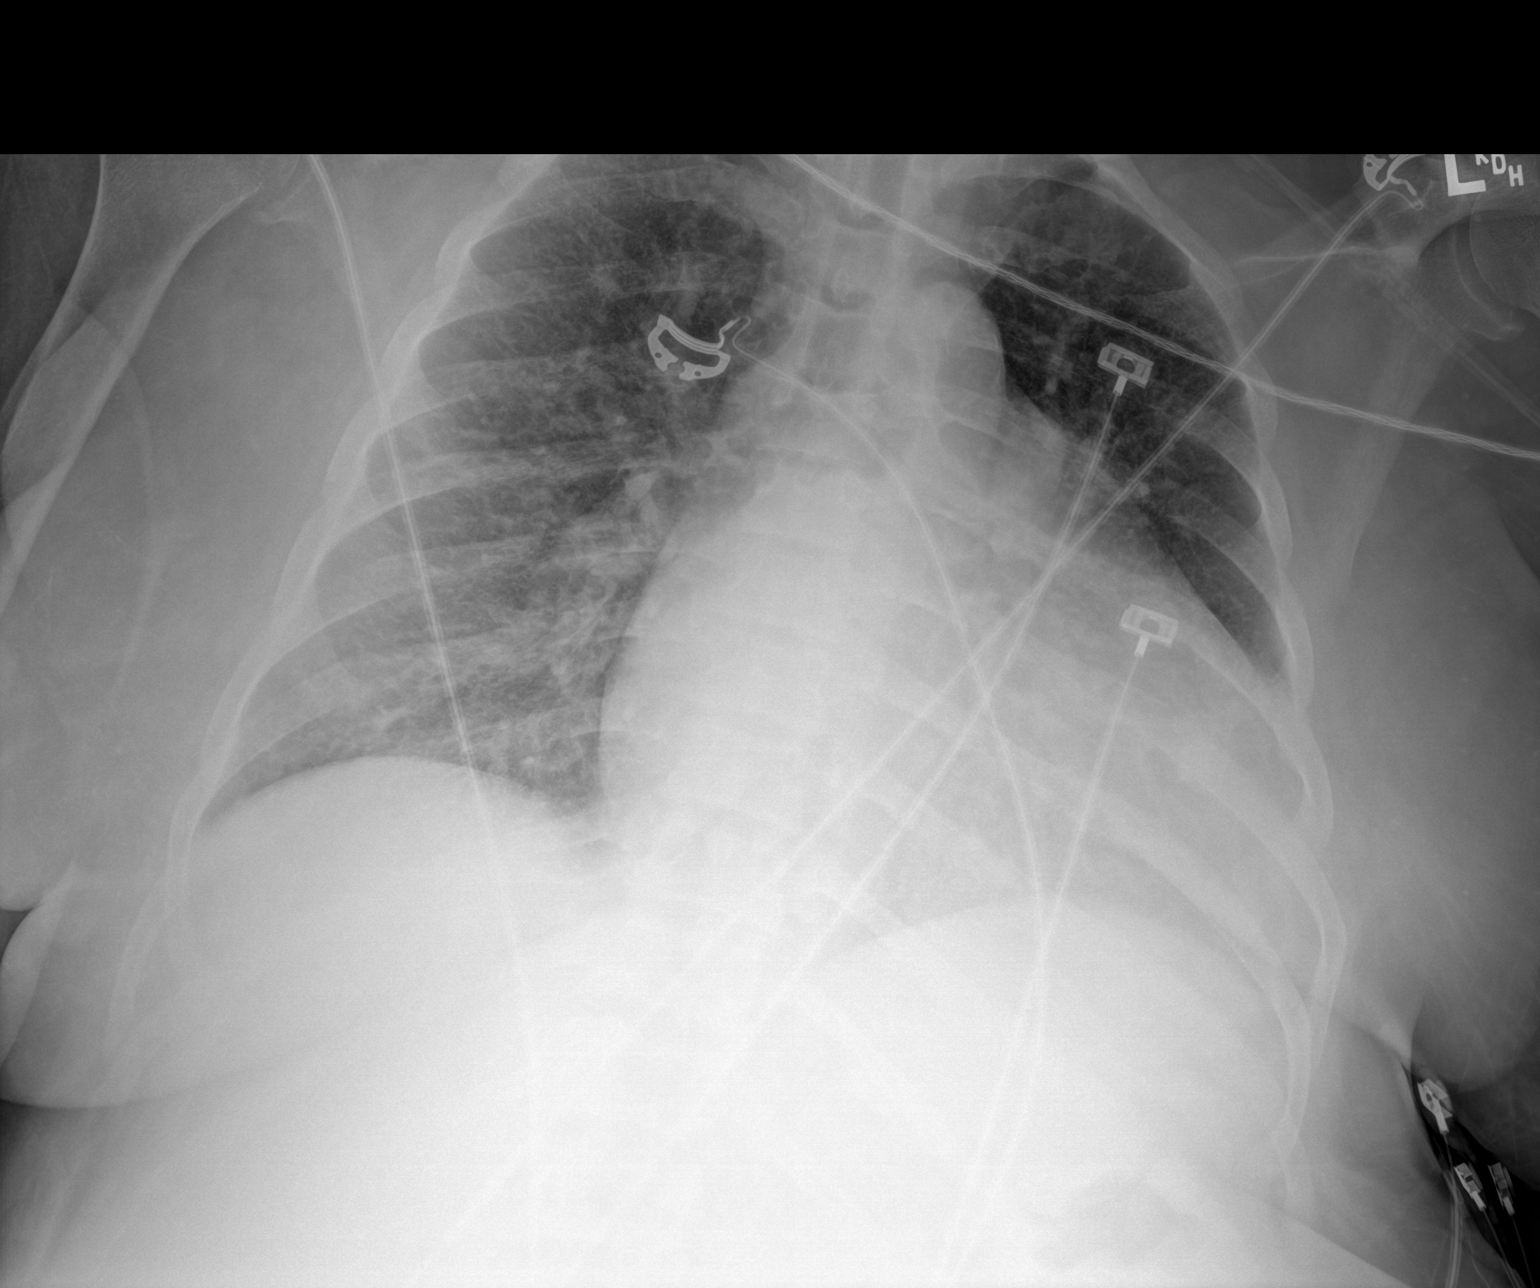

[1 of 1 positions shown; findings below may reference images not displayed]

FINDINGS: Enlarged cardiac silhouette. Query enlarged main pulmonary artery.
The heart and mediastinal contours are otherwise unchanged.
Atherosclerotic plaque of the aorta.

No focal consolidation. Chronic coarsened markings with otherwise no
pulmonary edema. No pleural effusion. No pneumothorax.

No acute osseous abnormality.
IMPRESSION: 1. Enlarged cardiac silhouette with underlying pericardial effusion
not excluded. Recommend repeat PA and lateral view of the chest for
further evaluation of the cardiac silhouette.
2. Aortic Atherosclerosis (G8T3C-699.9) and Emphysema (G8T3C-D8T.C).

## 2022-11-14 ENCOUNTER — Encounter: Payer: Self-pay | Admitting: Gastroenterology

## 2022-11-14 ENCOUNTER — Ambulatory Visit (INDEPENDENT_AMBULATORY_CARE_PROVIDER_SITE_OTHER): Payer: 59 | Admitting: Gastroenterology

## 2022-11-14 VITALS — BP 122/68 | HR 77 | Temp 98.2°F | Ht 66.0 in | Wt 193.2 lb

## 2022-11-14 DIAGNOSIS — K529 Noninfective gastroenteritis and colitis, unspecified: Secondary | ICD-10-CM | POA: Diagnosis not present

## 2022-11-14 DIAGNOSIS — K625 Hemorrhage of anus and rectum: Secondary | ICD-10-CM | POA: Diagnosis not present

## 2022-11-14 NOTE — Progress Notes (Signed)
Janet Darby, MD [address]  [place]  [place], [place] [address]  Main: [phone]  Fax: [phone]    Gastroenterology Consultation  Referring Provider:     Remi Haggard, FNP Primary Care Physician:  Remi Haggard, FNP Primary Gastroenterologist:  Dr. Cephas Olson Reason for Consultation:   Symptomatic hemorrhoids, new onset of diarrhea    HPI:   Janet Olson is a 52 y.o. female referred by Dr. Dewitt Hoes for consultation & management of new onset of diarrhea that has started approximately 2 and half months ago.  Patient used to suffer from severe constipation, was taking Linzess.  She has end-stage renal disease on hemodialysis.  History of grade 2 hemorrhoids, underwent hemorrhoid ligation x4.  Patient reports that she has been experiencing nonbloody diarrheal episodes up to 8 times daily for last 2 and half months associated with vague abdominal discomfort, predominant in the periumbilical and left lower quadrant areas.  She wakes up around 2:45 AM to have loose bowel movement, followed by several episodes throughout the day, worse after eating.  She has noticed rectal bleeding lately, bright red blood per rectum in the toilet bowl as well as on the toilet paper.  Her hemoglobin has remained stable.  Patient is worried and it is impairing her quality of life, particularly with several bowel movements.  She describes her stools on distal stool scale as 5.  She denies any changes in her diet.  She denies any epigastric pain, nausea or vomiting.  She denies any changes in her medications.  NSAIDs: none  Antiplts/Anticoagulants/Anti thrombotics: Eliquis for history of A-fib  GI Procedures: Colonoscopy for rectal bleeding, 04/24/2018 - The examined portion of the ileum was normal. - Three diminutive polyps in the rectum and in the cecum, removed with a cold biopsy forceps. Resected and retrieved. - Moderate diverticulosis in the sigmoid colon. There was  no evidence of diverticular bleeding. - Internal hemorrhoids. DIAGNOSIS:  A.  COLON POLYP, CECUM; COLD BIOPSY:  - COLONIC MUCOSA WITH PROMINENT LYMPHOID AGGREGATE.  - NEGATIVE FOR DYSPLASIA AND MALIGNANCY.   B.  RECTUM POLYP; COLD BIOPSY:  - EARLY HYPERPLASTIC POLYP.  - NEGATIVE FOR DYSPLASIA AND MALIGNANCY.   She denies family history of GI malignancy in first-degree or second-degree relatives She did not have any GI surgeries  Past Medical History:  Diagnosis Date   (HFpEF) heart failure with preserved ejection fraction ([place])    a. 2015 Echo: Nl EF. Mild to mod LVH; b. 04/2017 Echo: EF 55-60% Gr1 DD; c. 12/2018 Echo: EF 55-60%; d. 02/2020 Echo: EF 50-55%; e. 10/2020 Echo: EF 55-60%, mild LVH, mildly enlarged RV, sev elev RVSP, BAE, mild MR; 12/2020 Echo: EF 65%, sev dil RA, dil LA, RVSP 29.64mHg, MR/PR.   Abdominal ascites    Acute hypoxemic respiratory failure ([facility] 01/03/2019   Acute renal failure (ARF) ([facility] 05/17/2017   Acute respiratory failure ([facility] 09/25/2018   Anemia    Anxiety    Arthritis    Asthma    Atrial flutter ([facility]    a. Dx 03/2022-->CHA2DS2VASc = 3-->eliquis.   Cardiomegaly    COPD (chronic obstructive pulmonary disease) (HCC)    Depression    Dialysis patient ([facility]    MON, WED , FRI   Dyspnea    ESRD (end stage renal disease) (HCC)    GERD (gastroesophageal reflux disease)    Headache    Migraines   Hypertension    Nonrheumatic tricuspid (valve) insufficiency  Pericardial effusion 01/26/2019   Respiratory failure with hypoxia ([place]) 04/21/2018   Ulcer    gastric   Vitamin D deficiency     Past Surgical History:  Procedure Laterality Date   A/V FISTULAGRAM Left 04/17/2018   Procedure: A/V FISTULAGRAM;  Surgeon: Algernon Huxley, MD;  Location: [facility];  Service: Cardiovascular;  Laterality: Left;   A/V FISTULAGRAM Left 05/27/2018   Procedure: A/V FISTULAGRAM;  Surgeon: Katha Cabal, MD;  Location: [facility];  Service:  Cardiovascular;  Laterality: Left;   A/V FISTULAGRAM Left 07/05/2020   Procedure: A/V FISTULAGRAM;  Surgeon: Katha Cabal, MD;  Location: [facility];  Service: Cardiovascular;  Laterality: Left;   A/V FISTULAGRAM Left 06/26/2022   Procedure: A/V Fistulagram;  Surgeon: Katha Cabal, MD;  Location: [facility];  Service: Cardiovascular;  Laterality: Left;   AV FISTULA PLACEMENT Left 08/08/2017   Procedure: ARTERIOVENOUS (AV) FISTULA CREATION ( BRACHIOCEPHALIC );  Surgeon: Algernon Huxley, MD;  Location: [facility];  Service: Vascular;  Laterality: Left;   AV FISTULA PLACEMENT Left 08/05/2020   Procedure: INSERTION OF ARTERIOVENOUS (AV) GRAFT ARM(BRACHIAL AXILLARY);  Surgeon: Katha Cabal, MD;  Location: [facility];  Service: Vascular;  Laterality: Left;   CARDIOVERSION N/A 05/01/2022   Procedure: CARDIOVERSION;  Surgeon: Minna Merritts, MD;  Location: [facility];  Service: Cardiovascular;  Laterality: N/A;   CESAREAN SECTION     x 4 (1988, 1991, 1997, 2000 )   COLONOSCOPY WITH PROPOFOL N/A 04/24/2018   Procedure: COLONOSCOPY WITH PROPOFOL;  Surgeon: Lin Landsman, MD;  Location: [facility];  Service: Gastroenterology;  Laterality: N/A;   DIALYSIS/PERMA CATHETER INSERTION N/A 07/16/2017   Procedure: Dialysis/Perma Catheter Insertion;  Surgeon: Katha Cabal, MD;  Location: [facility];  Service: Cardiovascular;  Laterality: N/A;   DIALYSIS/PERMA CATHETER REMOVAL N/A 10/29/2017   Procedure: DIALYSIS/PERMA CATHETER REMOVAL;  Surgeon: Katha Cabal, MD;  Location: [facility];  Service: Cardiovascular;  Laterality: N/A;   DIALYSIS/PERMA CATHETER REMOVAL N/A 11/03/2020   Procedure: DIALYSIS/PERMA CATHETER REMOVAL;  Surgeon: Algernon Huxley, MD;  Location: [facility];  Service: Cardiovascular;  Laterality: N/A;   excision of lymph nodes Bilateral 2014   bilateral under arms.   PARATHYROID IMPLANT REMOVAL     REMOVAL OF A DIALYSIS  CATHETER Left 08/05/2020   Procedure: REMOVAL OF A DIALYSIS CATHETER ( FISTULA );  Surgeon: Katha Cabal, MD;  Location: [facility];  Service: Vascular;  Laterality: Left;   SHOULDER ARTHROSCOPY WITH SUBACROMIAL DECOMPRESSION AND OPEN ROTATOR C Right 01/08/2022   Procedure: Right shoulder arthroscopic subscapularis repair, rotator cuff repair, subacromial decompression, and distal clavicle excision;  Surgeon: Leim Fabry, MD;  Location: [facility];  Service: Orthopedics;  Laterality: Right;   TEE WITHOUT CARDIOVERSION N/A 05/01/2022   Procedure: TRANSESOPHAGEAL ECHOCARDIOGRAM (TEE);  Surgeon: Minna Merritts, MD;  Location: [facility];  Service: Cardiovascular;  Laterality: N/A;     Current Outpatient Medications:    acetaminophen (TYLENOL) 500 MG tablet, Take 2 tablets (1,000 mg total) by mouth every 8 (eight) hours., Disp: 90 tablet, Rfl: 2   albuterol (PROVENTIL) (2.5 MG/3ML) 0.083% nebulizer solution, Take 2.5 mg by nebulization every 6 (six) hours as needed for wheezing or shortness of breath. , Disp: , Rfl:    albuterol (VENTOLIN HFA) 108 (90 Base) MCG/ACT inhaler, Inhale 2 puffs into the lungs every 6 (six) hours as needed for wheezing or shortness of  breath. , Disp: , Rfl:    apixaban (ELIQUIS) 5 MG TABS tablet, Take 1 tablet (5 mg total) by mouth 2 (two) times daily., Disp: 60 tablet, Rfl: 11   Cholecalciferol 125 MCG (5000 UT) capsule, Take by mouth., Disp: , Rfl:    cinacalcet (SENSIPAR) 30 MG tablet, Take 1 tablet (30 mg total) by mouth every Monday, Wednesday, and Friday., Disp: 60 tablet, Rfl: 0   Heparin Sodium, Porcine, (HEPARIN LOCK FLUSH IJ), Inject 1 Dose as directed every Monday, Wednesday, and Friday. At dialysis, Disp: , Rfl:    hydrOXYzine (ATARAX) 25 MG tablet, Take by mouth., Disp: , Rfl:    lidocaine-prilocaine (EMLA) cream, 1 application as needed (prior to port being accessed)., Disp: , Rfl:    Metoprolol Tartrate 75 MG TABS, Take 50 mg by mouth., Disp: , Rfl:     multivitamin (RENA-VIT) TABS tablet, Take 1 tablet by mouth daily. , Disp: , Rfl: 1   pantoprazole (PROTONIX) 40 MG tablet, Take 1 tablet (40 mg total) by mouth daily., Disp: 30 tablet, Rfl: 0   polyethylene glycol powder (GLYCOLAX/MIRALAX) 17 GM/SCOOP powder, Take 255 g by mouth daily., Disp: 255 g, Rfl: 3   torsemide (DEMADEX) 100 MG tablet, Take 1 tablet (100 mg total) by mouth daily., Disp: 30 tablet, Rfl: 0   amLODipine (NORVASC) 10 MG tablet, Take 10 mg by mouth daily. (Patient not taking: Reported on 11/14/2022), Disp: , Rfl:    benzonatate (TESSALON) 200 MG capsule, Take 400 mg by mouth 3 (three) times daily as needed for cough. (Patient not taking: Reported on 11/14/2022), Disp: , Rfl:    CAPLYTA 10.5 MG CAPS, Take 1 capsule by mouth daily. (Patient not taking: Reported on 11/14/2022), Disp: , Rfl:    DULERA 200-5 MCG/ACT AERO, Inhale 2 puffs into the lungs 2 (two) times daily. (Patient not taking: Reported on 11/14/2022), Disp: , Rfl: 0   linaclotide (LINZESS) 72 MCG capsule, Take 72 mcg by mouth every evening. (Patient not taking: Reported on 11/14/2022), Disp: , Rfl:    lumateperone tosylate (CAPLYTA) 42 MG capsule, Take 42 mg by mouth at bedtime. (Patient not taking: Reported on 11/14/2022), Disp: , Rfl:    QUEtiapine (SEROQUEL) 100 MG tablet, Take 100-200 mg by mouth at bedtime as needed (sleep). (Patient not taking: Reported on 11/14/2022), Disp: , Rfl:    QUEtiapine (SEROQUEL) 25 MG tablet, Take 50 mg by mouth at bedtime as needed (sleep). (Patient not taking: Reported on 11/14/2022), Disp: , Rfl:    Family History  Problem Relation Age of Onset   Diabetes Mother      Social History   Tobacco Use   Smoking status: Every Day    Packs/day: 0.50    Years: 30.00    Total pack years: 15.00    Types: Cigarettes   Smokeless tobacco: Never  Vaping Use   Vaping Use: Never used  Substance Use Topics   Alcohol use: No   Drug use: No    Allergies as of 11/14/2022   (No Known  Allergies)    Review of Systems:    All systems reviewed and negative except where noted in HPI.   Physical Exam:  BP 122/68 (BP Location: Right Arm, Patient Position: Sitting, Cuff Size: Normal)   Pulse 77   Temp 98.2 F (36.8 C) (Oral)   Ht '5\' 6"'$  (1.676 m)   Wt 193 lb 3.2 oz (87.6 kg)   LMP 03/03/2018 (Approximate)   BMI 31.18 kg/m  Patient's last menstrual  period was 03/03/2018 (approximate).  General:   Alert,  Well-developed, well-nourished, pleasant and cooperative in NAD Head:  Normocephalic and atraumatic. Eyes:  Sclera clear, no icterus.   Conjunctiva pink. Ears:  Normal auditory acuity. Nose:  No deformity, discharge, or lesions. Mouth:  No deformity or lesions,oropharynx pink & moist. Neck:  Supple; no masses or thyromegaly. Lungs:  Respirations even and unlabored.  Clear throughout to auscultation.   No wheezes, crackles, or rhonchi. No acute distress. Heart:  Regular rate and rhythm; no murmurs, clicks, rubs, or gallops. Abdomen:  Normal bowel sounds. Soft, nondistended, with nontender umbilical hernia present, non-tender without masses, hepatosplenomegaly noted.  No guarding or rebound tenderness.   Rectal: Not performed Msk:  Symmetrical without gross deformities. Good, equal movement & strength bilaterally. Pulses:  Normal pulses noted. Extremities:  No clubbing, no edema.  No cyanosis, left upper arm AV fistula. Neurologic:  Alert and oriented x3;  grossly normal neurologically. Skin:  Intact without significant lesions or rashes. No jaundice. Psych:  Alert and cooperative. Normal mood and affect.  Imaging Studies: reviewed  Assessment and Plan:   Janet Olson is a 52 y.o. African-American female with end stage renal disease secondary to hypertension on hemodialysis, ascites secondary to ESRD and CHF, A-fib on Eliquis, history of symptomatic hemorrhoids s/p hemorrhoid ligation x4, chronic constipation is seen in consultation for approximately 2 and half  months history of chronic diarrhea and recurrence of bright red blood per rectum  Chronic diarrhea Recommend GI profile PCR Recommend H. pylori stool antigen If these tests are negative, recommend upper endoscopy and colonoscopy for further evaluation  Bright red blood per rectum Likely secondary to hemorrhoids, defer hemorrhoid ligation until work-up of diarrhea is completed given that her hemoglobin has been stable Given that she underwent hemorrhoid ligation in the past, discussed with her regarding vascular embolization versus surgical hemorrhoidectomy in future  Follow up in 3 months or sooner as needed   Janet Darby, MD

## 2022-11-17 LAB — H. PYLORI ANTIGEN, STOOL: H pylori Ag, Stl: NEGATIVE

## 2022-11-19 ENCOUNTER — Other Ambulatory Visit: Payer: Self-pay

## 2022-11-19 ENCOUNTER — Telehealth: Payer: Self-pay

## 2022-11-19 DIAGNOSIS — K529 Noninfective gastroenteritis and colitis, unspecified: Secondary | ICD-10-CM

## 2022-11-19 DIAGNOSIS — I700 Atherosclerosis of aorta: Secondary | ICD-10-CM

## 2022-11-19 LAB — GI PROFILE, STOOL, PCR

## 2022-11-19 MED ORDER — NA SULFATE-K SULFATE-MG SULF 17.5-3.13-1.6 GM/177ML PO SOLN: 354.0000 mL | Freq: Once | ORAL | 0 refills | Status: AC

## 2022-11-19 NOTE — Telephone Encounter (Signed)
Patient with diagnosis of aflutter on Eliquis for anticoagulation.    Procedure: colonoscopy and EGD Date of procedure: 12/06/22  CHA2DS2-VASc Score = 5  This indicates a 7.2% annual risk of stroke. The patient's score is based upon: CHF History: 1 HTN History: 1 Diabetes History: 1 Stroke History: 0 Vascular Disease History: 1 Age Score: 0 Gender Score: 1  CrCl - on dialysis Platelet count 121K  Per office protocol, patient can hold Eliquis for 3 days prior to procedure.    **This guidance is not considered finalized until pre-operative APP has relayed final recommendations.**

## 2022-11-19 NOTE — Telephone Encounter (Signed)
   Name: Janet Olson  DOB: March 14, 1970  MRN: [id number]  Primary Cardiologist: Ida Rogue, MD   Preoperative team, please contact this patient and set up a phone call appointment for further preoperative risk assessment. Please obtain consent and complete medication review. Thank you for your help.  I confirm that guidance regarding antiplatelet and oral anticoagulation therapy has been completed and, if necessary, noted below.  Pharmacy has addressed anticoagulation request   Deberah Pelton, NP 11/19/2022, 2:55 PM [place]

## 2022-11-19 NOTE — Telephone Encounter (Signed)
Patient verbalized understanding of results. Patient wants to schedule procedure for 12/06/2022. Went over instructions and mailed them. Sent prep to the pharmacy

## 2022-11-19 NOTE — Telephone Encounter (Signed)
-----   Message from Lin Landsman, MD sent at 11/19/2022 12:54 PM EST ----- Please inform patient that the stool studies came back negative for infection.  As discussed, recommend upper endoscopy and colonoscopy for further evaluation for chronic diarrhea  RV

## 2022-11-19 NOTE — Telephone Encounter (Signed)
Pt has appt 11/26/22 with Dr. Rockey Situ. I have added need pre op clearance to appt notes .

## 2022-11-19 NOTE — Telephone Encounter (Signed)
   [facility] Pre-operative Risk Assessment    Request for surgical clearance:  What type of surgery is being performed? Colonoscopy and EGD   When is this surgery scheduled? 12/06/2022   Are there any medications that need to be held prior to surgery and how long? Eliquis 9m   Practice name and name of physician performing surgery? [facility]. VMarius Ditch   What is your office phone and fax number? [phone]  Anesthesia type (None, local, MAC, general) ? General    AShelby Mattocks11/20/2023, 1:04 PM  _________________________________________________________________   (provider comments below)

## 2022-11-24 NOTE — Progress Notes (Signed)
Cardiology Office Note  Date:  11/26/2022   ID:  Janet Olson, DOB Dec 28, 1970, MRN [id number]  PCP:  Remi Haggard, FNP   Chief Complaint  Patient presents with   6 month follow up     Patient c/o shortness of breath with walking a short distance. Medications reviewed by the patient verbally.    HPI:  [name] is a 52 year old woman with past medical history of ESRD On HD mon/wed/Fri for 4 years Smoker 13 cigarettes/day, COPD HTN/renal dz Anemia of chronic dz Asthma Diabetes  Left ventricular ejection fraction, by estimation, is 40 to 45%. Atrial flutter April and May 2023 with cardioversion May 01, 2022 Ascites requiring paracentesis Who presents for f/u of her pulmonary edema, atrial flutter, cardiomyopathy  Last seen in clinic by myself May 2023 Recently seen by vascular, fistulogram noting pseudoaneurysm  Reports having periodic low blood pressure during and after dialysis Sometimes systolic down to 90 Had dialysis today, blood pressure today 90/50 Sometimes after dialysis, blood pressure will spike if holding medication Does not like it when her blood pressure shoots up if she holds her amlodipine or metoprolol  Denies chest pain, no lower extremity edema Denies any tachypalpitations concerning for atrial fibrillation or flutter No ascites Feels she may eat extra dialysis as she had more salt and fluids over holiday season  EKG personally reviewed by myself on todays visit Normal sinus rhythm rate 76 bpm T wave abnormality precordial leads  Other past medical history reviewed hospitalizations  admitted to [facility] between April 12 and April 13 2022 in the setting of altered mental status and acute uremia, after skipping dialysis for a week.  In the ED, she was initially minimally responsive, and she was admitted for uremic encephalopathy and hyperkalemia with emergent hemodialysis on April 12.   admission ECG showed rate controlled 2-1 atrial  flutter at 94 bpm.    Her metoprolol dose was increased during her hospitalization of 75 mg twice daily.  Seen in the office in follow-up and cardioversion was scheduled cardioversion May 01, 2022 for atrial flutter Normal sinus rhythm restored  She remains in normal sinus rhythm on today's visit, denies any tachypalpitations concerning for arrhythmia This week had HD sat, Monday, wed,   weight higher, more swelling, normal 3:45 hr, stopping early Fistula working slow, "main sticker having trouble", "has knots" Has appt with vascular June 1st to review fistula  Takes torsemide 100 mg daily, makes  Lots of ice intake , large bag a week Followed by Dr. Holley Raring Some SOB, has albuterol nebs  Still smoking Leg swelling worse on right than left  Paracentesis 2 weeks ago,05/08/2022 Feels that it is coming back as dialysis has been more troublesome  Hospital 10/2020 paracentesis with 3.2 L fluid removal.   Blood culture returned positive for Enterococcus bacteremia.   started on IV ampicillin.   Tunneled hemodialysis catheter removed on 11/4.   echocardiogram which was negative for endocarditis.   TEE was tentatively scheduled for 11/8. She declined the study, left AMA  Echo 12/2020 done with primary care, normal LV function Unable to review images  Could not tolerate coreg, upset stomach  hospitalized 1/21, ascites requiring paracentesis with removal of 2 L fluid.  Patient reports that she has missed several recent dialysis (3 in total) as she was feeling sick.  Hospital 02/2020,  Secondary to volume overload after missing several recent dialysis sessions. paracentesis with 7.5 L clear ascites fluid removed  Echo 02/2020 results reviewed with  her  1. Left ventricular ejection fraction, by estimation, is 50 to 55%. The  left ventricle has low normal function. The left ventricle has no regional  wall motion abnormalities. There is moderate left ventricular hypertrophy.  --  interventricular septum is  flattened in diastole ('D' shaped left ventricle), consistent with right  ventricular volume overload.   --Right ventricular systolic function is moderately reduced. The right  ventricular size is mildly enlarged. There is moderately elevated  pulmonary artery systolic pressure.  --Acites is present.. The pericardial effusion is posterior to the left  ventricle.   Moderately dilated pulmonary artery.    The inferior vena cava is dilated  right atrial pressure of 15 mmHg.    PMH:   has a past medical history of (HFpEF) heart failure with preserved ejection fraction ([place]), Abdominal ascites, Acute hypoxemic respiratory failure ([place]) (01/03/2019), Acute renal failure (ARF) ([place]) (05/17/2017), Acute respiratory failure ([place]) (09/25/2018), Anemia, Anxiety, Arthritis, Asthma, Atrial flutter ([place]), Cardiomegaly, COPD (chronic obstructive pulmonary disease) ([place]), Depression, Dialysis patient ([place]), Dyspnea, ESRD (end stage renal disease) ([place]), GERD (gastroesophageal reflux disease), Headache, Hypertension, Nonrheumatic tricuspid (valve) insufficiency, Pericardial effusion (01/26/2019), Respiratory failure with hypoxia ([place]) (04/21/2018), Ulcer, and Vitamin D deficiency.  PSH:    Past Surgical History:  Procedure Laterality Date   A/V FISTULAGRAM Left 04/17/2018   Procedure: A/V FISTULAGRAM;  Surgeon: Algernon Huxley, MD;  Location: [facility];  Service: Cardiovascular;  Laterality: Left;   A/V FISTULAGRAM Left 05/27/2018   Procedure: A/V FISTULAGRAM;  Surgeon: Katha Cabal, MD;  Location: [facility];  Service: Cardiovascular;  Laterality: Left;   A/V FISTULAGRAM Left 07/05/2020   Procedure: A/V FISTULAGRAM;  Surgeon: Katha Cabal, MD;  Location: [facility];  Service: Cardiovascular;  Laterality: Left;   A/V FISTULAGRAM Left 06/26/2022   Procedure: A/V Fistulagram;  Surgeon: Katha Cabal, MD;  Location: [facility];  Service:  Cardiovascular;  Laterality: Left;   AV FISTULA PLACEMENT Left 08/08/2017   Procedure: ARTERIOVENOUS (AV) FISTULA CREATION ( BRACHIOCEPHALIC );  Surgeon: Algernon Huxley, MD;  Location: [facility];  Service: Vascular;  Laterality: Left;   AV FISTULA PLACEMENT Left 08/05/2020   Procedure: INSERTION OF ARTERIOVENOUS (AV) GRAFT ARM(BRACHIAL AXILLARY);  Surgeon: Katha Cabal, MD;  Location: [facility];  Service: Vascular;  Laterality: Left;   CARDIOVERSION N/A 05/01/2022   Procedure: CARDIOVERSION;  Surgeon: Minna Merritts, MD;  Location: [facility];  Service: Cardiovascular;  Laterality: N/A;   CESAREAN SECTION     x 4 (1988, 1991, 1997, 2000 )   COLONOSCOPY WITH PROPOFOL N/A 04/24/2018   Procedure: COLONOSCOPY WITH PROPOFOL;  Surgeon: Lin Landsman, MD;  Location: [facility];  Service: Gastroenterology;  Laterality: N/A;   DIALYSIS/PERMA CATHETER INSERTION N/A 07/16/2017   Procedure: Dialysis/Perma Catheter Insertion;  Surgeon: Katha Cabal, MD;  Location: [facility];  Service: Cardiovascular;  Laterality: N/A;   DIALYSIS/PERMA CATHETER REMOVAL N/A 10/29/2017   Procedure: DIALYSIS/PERMA CATHETER REMOVAL;  Surgeon: Katha Cabal, MD;  Location: [facility];  Service: Cardiovascular;  Laterality: N/A;   DIALYSIS/PERMA CATHETER REMOVAL N/A 11/03/2020   Procedure: DIALYSIS/PERMA CATHETER REMOVAL;  Surgeon: Algernon Huxley, MD;  Location: [facility];  Service: Cardiovascular;  Laterality: N/A;   excision of lymph nodes Bilateral 2014   bilateral under arms.   PARATHYROID IMPLANT REMOVAL     REMOVAL OF A DIALYSIS CATHETER Left 08/05/2020   Procedure: REMOVAL OF A  DIALYSIS CATHETER ( FISTULA );  Surgeon: Katha Cabal, MD;  Location: [facility];  Service: Vascular;  Laterality: Left;   SHOULDER ARTHROSCOPY WITH SUBACROMIAL DECOMPRESSION AND OPEN ROTATOR C Right 01/08/2022   Procedure: Right shoulder arthroscopic subscapularis repair, rotator cuff repair, subacromial  decompression, and distal clavicle excision;  Surgeon: Leim Fabry, MD;  Location: [facility];  Service: Orthopedics;  Laterality: Right;   TEE WITHOUT CARDIOVERSION N/A 05/01/2022   Procedure: TRANSESOPHAGEAL ECHOCARDIOGRAM (TEE);  Surgeon: Minna Merritts, MD;  Location: [facility];  Service: Cardiovascular;  Laterality: N/A;    Current Outpatient Medications  Medication Sig Dispense Refill   acetaminophen (TYLENOL) 500 MG tablet Take 2 tablets (1,000 mg total) by mouth every 8 (eight) hours. 90 tablet 2   albuterol (PROVENTIL) (2.5 MG/3ML) 0.083% nebulizer solution Take 2.5 mg by nebulization every 6 (six) hours as needed for wheezing or shortness of breath.      albuterol (VENTOLIN HFA) 108 (90 Base) MCG/ACT inhaler Inhale 2 puffs into the lungs every 6 (six) hours as needed for wheezing or shortness of breath.      amLODipine (NORVASC) 10 MG tablet Take 10 mg by mouth daily.     apixaban (ELIQUIS) 5 MG TABS tablet Take 1 tablet (5 mg total) by mouth 2 (two) times daily. 60 tablet 11   benzonatate (TESSALON) 200 MG capsule Take 400 mg by mouth 3 (three) times daily as needed for cough.     BREZTRI AEROSPHERE 160-9-4.8 MCG/ACT AERO SMARTSIG:2 Puff(s) By Mouth Twice Daily     CAPLYTA 10.5 MG CAPS Take 1 capsule by mouth daily.     Cholecalciferol 125 MCG (5000 UT) capsule Take by mouth.     cinacalcet (SENSIPAR) 30 MG tablet Take 1 tablet (30 mg total) by mouth every Monday, Wednesday, and Friday. 60 tablet 0   hydrOXYzine (ATARAX) 10 MG tablet Take 10 mg by mouth 2 (two) times daily as needed.     lidocaine-prilocaine (EMLA) cream 1 application as needed (prior to port being accessed).     metoprolol tartrate (LOPRESSOR) 100 MG tablet Take 50 mg by mouth 2 (two) times daily.     multivitamin (RENA-VIT) TABS tablet Take 1 tablet by mouth daily.   1   pantoprazole (PROTONIX) 40 MG tablet Take 1 tablet (40 mg total) by mouth daily. 30 tablet 0   polyethylene glycol powder (GLYCOLAX/MIRALAX) 17  GM/SCOOP powder Take 255 g by mouth daily. 255 g 3   QUEtiapine (SEROQUEL) 100 MG tablet Take 100-200 mg by mouth at bedtime as needed (sleep).     torsemide (DEMADEX) 100 MG tablet Take 1 tablet (100 mg total) by mouth daily. 30 tablet 0   DULERA 200-5 MCG/ACT AERO Inhale 2 puffs into the lungs 2 (two) times daily. (Patient not taking: Reported on 11/14/2022)  0   Heparin Sodium, Porcine, (HEPARIN LOCK FLUSH IJ) Inject 1 Dose as directed every Monday, Wednesday, and Friday. At dialysis (Patient not taking: Reported on 11/26/2022)     hydrOXYzine (ATARAX) 25 MG tablet Take by mouth. (Patient not taking: Reported on 11/26/2022)     linaclotide (LINZESS) 72 MCG capsule Take 72 mcg by mouth every evening. (Patient not taking: Reported on 11/14/2022)     lumateperone tosylate (CAPLYTA) 42 MG capsule Take 42 mg by mouth at bedtime. (Patient not taking: Reported on 11/14/2022)     QUEtiapine (SEROQUEL) 25 MG tablet Take 50 mg by mouth at bedtime as needed (sleep). (Patient not taking: Reported on 11/14/2022)  No current facility-administered medications for this visit.    Allergies:   Patient has no known allergies.   Social History:  The patient  reports that she has been smoking cigarettes. She has a 15.00 pack-year smoking history. She has never used smokeless tobacco. She reports that she does not drink alcohol and does not use drugs.   Family History:   family history includes Diabetes in her mother.    Review of Systems: Review of Systems  Constitutional:  Positive for malaise/fatigue.  HENT: Negative.    Respiratory:  Positive for shortness of breath.   Cardiovascular:  Positive for leg swelling.  Gastrointestinal: Negative.   Musculoskeletal: Negative.   Neurological: Negative.   Psychiatric/Behavioral: Negative.    All other systems reviewed and are negative.    PHYSICAL EXAM: VS:  BP (!) 90/50 (BP Location: Left Arm, Patient Position: Sitting, Cuff Size: Normal)   Pulse 76    Ht '5\' 6"'$  (1.676 m)   Wt 197 lb 4 oz (89.5 kg)   LMP 03/03/2018 (Approximate)   SpO2 92%   BMI 31.84 kg/m  , BMI Body mass index is 31.84 kg/m. Constitutional:  oriented to person, place, and time. No distress.  HENT:  Head: Grossly normal Eyes:  no discharge. No scleral icterus.  Neck: No JVD, no carotid bruits  Cardiovascular: Regular rate and rhythm, no murmurs appreciated Pulmonary/Chest: Clear to auscultation bilaterally, no wheezes or rails Abdominal: Soft.  no distension.  no tenderness.  Musculoskeletal: Normal range of motion Neurological:  normal muscle tone. Coordination normal. No atrophy Skin: Skin warm and dry Psychiatric: normal affect, pleasant   Recent Labs: 02/13/2022: TSH 1.001 04/11/2022: ALT 16 04/12/2022: B Natriuretic Peptide 2,038.6 04/13/2022: BUN 55; Creatinine, Ser 9.92; Hemoglobin 12.2; Magnesium 1.4; Platelets 134; Potassium 4.2; Sodium 139    Lipid Panel Lab Results  Component Value Date   CHOL 121 05/19/2021   HDL 70 05/19/2021   LDLCALC 39 05/19/2021   TRIG 49 05/19/2021      Wt Readings from Last 3 Encounters:  11/26/22 197 lb 4 oz (89.5 kg)  11/14/22 193 lb 3.2 oz (87.6 kg)  09/07/22 195 lb (88.5 kg)      ASSESSMENT AND PLAN: Preop cardiovascular evaluation Acceptable risk for EGD/colonoscopy next week  we have given her directions to hold Eliquis 3 days prior to procedure and restart at the discretion of the GI physician performing endoscopy No further cardiac testing needed We have recommended she take her blood pressure medications morning of the procedure  ESRD on dialysis [facility]) -  Therapy on Monday Wednesday Friday, Sometimes Saturday Some low pressures Recommend she hold amlodipine prior to dialysis, take this at the end of her session Continue metoprolol 100 twice daily  Chronic obstructive pulmonary disease, unspecified COPD type ([place]) :  Long history of smoking, still smoking 13 cigarettes/day On  nebulizers/inhalers Cessation recommended  Anemia in chronic kidney disease, on chronic dialysis ([place]) - Anemia managed by nephrology, appears stable On Epogen Hgb 12.2  Atrial flutter  hospital admission April 2023, underwent cardioversion,  maintaining normal sinus rhythm Continue metoprolol tartrate 100 twice daily with Eliquis  benign hypertensive renal disease - Plan: EKG 12-Lead Labile pressure with dialysis Recommend she hold amlodipine prior to hemodialysis, take this at the end of her session  Cardiac hypertrophy Secondary to longstanding hypertension  Ascites Typically well controlled with aggressive hemodialysis No ascites   Total encounter time more than 40 minutes  Greater than 50% was spent in counseling and  coordination of care with the patient     Signed, Esmond Plants, M.D., Ph.D. 11/26/2022  [place], [place]

## 2022-11-26 ENCOUNTER — Encounter: Payer: Self-pay | Admitting: Cardiovascular Disease

## 2022-11-26 ENCOUNTER — Ambulatory Visit: Payer: 59 | Attending: Cardiovascular Disease | Admitting: Cardiovascular Disease

## 2022-11-26 VITALS — BP 90/50 | HR 76 | Ht 66.0 in | Wt 197.2 lb

## 2022-11-26 DIAGNOSIS — N186 End stage renal disease: Secondary | ICD-10-CM

## 2022-11-26 DIAGNOSIS — Z720 Tobacco use: Secondary | ICD-10-CM | POA: Diagnosis not present

## 2022-11-26 DIAGNOSIS — I5032 Chronic diastolic (congestive) heart failure: Secondary | ICD-10-CM | POA: Diagnosis not present

## 2022-11-26 DIAGNOSIS — R002 Palpitations: Secondary | ICD-10-CM

## 2022-11-26 DIAGNOSIS — E1169 Type 2 diabetes mellitus with other specified complication: Secondary | ICD-10-CM

## 2022-11-26 DIAGNOSIS — J449 Chronic obstructive pulmonary disease, unspecified: Secondary | ICD-10-CM

## 2022-11-26 DIAGNOSIS — I272 Pulmonary hypertension, unspecified: Secondary | ICD-10-CM

## 2022-11-26 DIAGNOSIS — E785 Hyperlipidemia, unspecified: Secondary | ICD-10-CM

## 2022-11-26 DIAGNOSIS — I10 Essential (primary) hypertension: Secondary | ICD-10-CM

## 2022-11-26 DIAGNOSIS — Z992 Dependence on renal dialysis: Secondary | ICD-10-CM

## 2022-11-26 DIAGNOSIS — I483 Typical atrial flutter: Secondary | ICD-10-CM

## 2022-11-26 DIAGNOSIS — I700 Atherosclerosis of aorta: Secondary | ICD-10-CM

## 2022-11-26 NOTE — Patient Instructions (Addendum)
Medication Instructions:  - Your physician has recommended you make the following change in your medication:   Hold eliquis 3 days before colonoscopy/EGD, For procedure 12/06/22,  last dose would be dec 3rd in the PM None on Monday/Tuesday/Wednesday/Thursday  If you need a refill on your cardiac medications before your next appointment, please call your pharmacy.    Lab work: No new labs needed   Testing/Procedures: No new testing needed   Follow-Up: At [facility], you and your health needs are our priority.  As part of our continuing mission to provide you with exceptional heart care, we have created designated Provider Care Teams.  These Care Teams include your primary Cardiologist (physician) and Advanced Practice Providers (APPs -  Physician Assistants and Nurse Practitioners) who all work together to provide you with the care you need, when you need it.  You will need a follow up appointment in 6 months  Providers on your designated Care Team:   Murray Hodgkins, NP Christell Faith, PA-C Cadence Kathlen Mody, [place]  COVID-19 Vaccine Information can be found at: [email or web address] For questions related to vaccine distribution or appointments, please email [email or web address] or call [phone].

## 2022-11-27 ENCOUNTER — Telehealth: Payer: Self-pay

## 2022-11-27 NOTE — Telephone Encounter (Signed)
Dr. Rockey Situ,   I see this patient had an office visit with you yesterday. I am not sure if she mentioned her upcoming colonoscopy and EGD on 12/06/2022. GI requested we assess her risk for this procedure. Would you feel comfortable making an addendum to your note or letting me know if you feel she is at an acceptable risk to undergo her GI procedure?

## 2022-11-27 NOTE — Telephone Encounter (Signed)
Per cardiology hold Eliquis 3 days prior to EGD/colonoscopy.   Called patient but unable to leave a voicemail because it rang and then the phone disconnected

## 2022-11-27 NOTE — Telephone Encounter (Signed)
Patient had appointment on 11/26/2022 please advise on blood thinner clearance

## 2022-11-27 NOTE — Telephone Encounter (Signed)
-----   Message from Shelby Mattocks, [place] sent at 11/19/2022  4:15 PM EST ----- Check on blood thinner clearance

## 2022-11-28 NOTE — Telephone Encounter (Signed)
Patient verbalized understanding of instructions  

## 2022-11-28 NOTE — Telephone Encounter (Signed)
Tried called patient but unable to leave a voicemail because it is not set up

## 2022-12-06 ENCOUNTER — Ambulatory Visit: Payer: 59 | Admitting: Certified Registered"

## 2022-12-06 ENCOUNTER — Encounter
Admission: RE | Disposition: A | Discharge status: Home / Self Care | Payer: Self-pay | Source: Home / Self Care | Attending: Gastroenterology

## 2022-12-06 ENCOUNTER — Ambulatory Visit
Admission: RE | Admit: 2022-12-06 | Discharge: 2022-12-06 | Disposition: A | Discharge status: Home / Self Care | Payer: 59 | Attending: Gastroenterology | Admitting: Gastroenterology

## 2022-12-06 ENCOUNTER — Encounter: Payer: Self-pay | Admitting: Gastroenterology

## 2022-12-06 ENCOUNTER — Other Ambulatory Visit: Payer: Self-pay

## 2022-12-06 DIAGNOSIS — I132 Hypertensive heart and chronic kidney disease with heart failure and with stage 5 chronic kidney disease, or end stage renal disease: Secondary | ICD-10-CM | POA: Insufficient documentation

## 2022-12-06 DIAGNOSIS — K5730 Diverticulosis of large intestine without perforation or abscess without bleeding: Secondary | ICD-10-CM | POA: Diagnosis not present

## 2022-12-06 DIAGNOSIS — K219 Gastro-esophageal reflux disease without esophagitis: Secondary | ICD-10-CM | POA: Diagnosis not present

## 2022-12-06 DIAGNOSIS — R634 Abnormal weight loss: Secondary | ICD-10-CM | POA: Diagnosis not present

## 2022-12-06 DIAGNOSIS — Z992 Dependence on renal dialysis: Secondary | ICD-10-CM | POA: Insufficient documentation

## 2022-12-06 DIAGNOSIS — I4892 Unspecified atrial flutter: Secondary | ICD-10-CM | POA: Insufficient documentation

## 2022-12-06 DIAGNOSIS — Z6830 Body mass index (BMI) 30.0-30.9, adult: Secondary | ICD-10-CM | POA: Diagnosis not present

## 2022-12-06 DIAGNOSIS — N186 End stage renal disease: Secondary | ICD-10-CM | POA: Insufficient documentation

## 2022-12-06 DIAGNOSIS — K3189 Other diseases of stomach and duodenum: Secondary | ICD-10-CM | POA: Diagnosis not present

## 2022-12-06 DIAGNOSIS — K635 Polyp of colon: Secondary | ICD-10-CM | POA: Diagnosis not present

## 2022-12-06 DIAGNOSIS — Z95828 Presence of other vascular implants and grafts: Secondary | ICD-10-CM | POA: Diagnosis not present

## 2022-12-06 DIAGNOSIS — I5032 Chronic diastolic (congestive) heart failure: Secondary | ICD-10-CM | POA: Diagnosis not present

## 2022-12-06 DIAGNOSIS — N179 Acute kidney failure, unspecified: Secondary | ICD-10-CM | POA: Diagnosis not present

## 2022-12-06 DIAGNOSIS — F17210 Nicotine dependence, cigarettes, uncomplicated: Secondary | ICD-10-CM | POA: Insufficient documentation

## 2022-12-06 DIAGNOSIS — J449 Chronic obstructive pulmonary disease, unspecified: Secondary | ICD-10-CM | POA: Diagnosis not present

## 2022-12-06 DIAGNOSIS — K529 Noninfective gastroenteritis and colitis, unspecified: Secondary | ICD-10-CM | POA: Diagnosis present

## 2022-12-06 DIAGNOSIS — K2289 Other specified disease of esophagus: Secondary | ICD-10-CM | POA: Diagnosis not present

## 2022-12-06 HISTORY — PX: COLONOSCOPY WITH PROPOFOL: SHX5780

## 2022-12-06 HISTORY — PX: ESOPHAGOGASTRODUODENOSCOPY (EGD) WITH PROPOFOL: SHX5813

## 2022-12-06 LAB — POCT I-STAT, CHEM 8
BUN: 18 mg/dL (ref 6–20)
Calcium, Ion: 0.87 mmol/L — CL (ref 1.15–1.40)
Chloride: 106 mmol/L (ref 98–111)
Creatinine, Ser: 7 mg/dL — ABNORMAL HIGH (ref 0.44–1.00)
Glucose, Bld: 72 mg/dL (ref 70–99)
HCT: 30 % — ABNORMAL LOW (ref 36.0–46.0)
Hemoglobin: 10.2 g/dL — ABNORMAL LOW (ref 12.0–15.0)
Potassium: 3.4 mmol/L — ABNORMAL LOW (ref 3.5–5.1)
Sodium: 144 mmol/L (ref 135–145)
TCO2: 24 mmol/L (ref 22–32)

## 2022-12-06 SURGERY — COLONOSCOPY WITH PROPOFOL
Anesthesia: General

## 2022-12-06 MED ORDER — DEXMEDETOMIDINE HCL IN NACL 200 MCG/50ML IV SOLN
INTRAVENOUS | Status: DC | PRN
  Administered 2022-12-06: 12 ug via INTRAVENOUS
  Administered 2022-12-06: 8 ug via INTRAVENOUS

## 2022-12-06 MED ORDER — SODIUM CHLORIDE 0.9 % IV SOLN: INTRAVENOUS | Status: DC

## 2022-12-06 MED ORDER — PROPOFOL 500 MG/50ML IV EMUL
INTRAVENOUS | Status: DC | PRN
  Administered 2022-12-06: 150 ug/kg/min via INTRAVENOUS

## 2022-12-06 MED ORDER — LIDOCAINE HCL (CARDIAC) PF 100 MG/5ML IV SOSY
PREFILLED_SYRINGE | INTRAVENOUS | Status: DC | PRN
  Administered 2022-12-06: 100 mg via INTRAVENOUS

## 2022-12-06 MED ORDER — PROPOFOL 10 MG/ML IV BOLUS
INTRAVENOUS | Status: DC | PRN
  Administered 2022-12-06: 100 mg via INTRAVENOUS
  Administered 2022-12-06 (×3): 20 mg via INTRAVENOUS
  Administered 2022-12-06 (×2): 10 mg via INTRAVENOUS
  Administered 2022-12-06 (×2): 20 mg via INTRAVENOUS

## 2022-12-06 MED ORDER — PROPOFOL 1000 MG/100ML IV EMUL
INTRAVENOUS | Status: AC
  Filled 2022-12-06: qty 100

## 2022-12-06 NOTE — Op Note (Signed)
[facility] Patient Name: Janet Olson Procedure Date: 12/06/2022 9:02 AM MRN: [id number] Account #: [id number] Date of Birth: [birth year] Admit Type: Outpatient Age: 52 Room: [facility] ENDO ROOM 3 Gender: Female Note Status: Finalized Instrument Name: Altamese Cabal Endoscope [id number] Procedure:             Upper GI endoscopy Indications:           Diarrhea, Weight loss Providers:             Lin Landsman MD, MD Referring MD:          Jordan Likes. Lavena Bullion (Referring MD) Medicines:             General Anesthesia Complications:         No immediate complications. Estimated blood loss: None. Procedure:             Pre-Anesthesia Assessment:                        - Prior to the procedure, a History and Physical was                         performed, and patient medications and allergies were                         reviewed. The patient is competent. The risks and                         benefits of the procedure and the sedation options and                         risks were discussed with the patient. All questions                         were answered and informed consent was obtained.                         Patient identification and proposed procedure were                         verified by the physician, the nurse, the                         anesthesiologist, the anesthetist and the technician                         in the pre-procedure area in the procedure room in the                         endoscopy suite. Mental Status Examination: alert and                         oriented. Airway Examination: normal oropharyngeal                         airway and neck mobility. Respiratory Examination:                         clear to auscultation. CV Examination: normal.  Prophylactic Antibiotics: The patient does not require                         prophylactic antibiotics. Prior Anticoagulants: The                         patient has  taken Eliquis (apixaban), last dose was 3                         days prior to procedure. ASA Grade Assessment: III - A                         patient with severe systemic disease. After reviewing                         the risks and benefits, the patient was deemed in                         satisfactory condition to undergo the procedure. The                         anesthesia plan was to use general anesthesia.                         Immediately prior to administration of medications,                         the patient was re-assessed for adequacy to receive                         sedatives. The heart rate, respiratory rate, oxygen                         saturations, blood pressure, adequacy of pulmonary                         ventilation, and response to care were monitored                         throughout the procedure. The physical status of the                         patient was re-assessed after the procedure.                        After obtaining informed consent, the endoscope was                         passed under direct vision. Throughout the procedure,                         the patient's blood pressure, pulse, and oxygen                         saturations were monitored continuously. The Endoscope                         was introduced through the mouth, and advanced to  the                         second part of duodenum. The upper GI endoscopy was                         accomplished without difficulty. The patient tolerated                         the procedure well. Findings:      The duodenal bulb and second portion of the duodenum were normal.       Biopsies were taken with a cold forceps for histology.      Diffuse nodular mucosa was found in the gastric body. Biopsies were       taken with a cold forceps for histology.      The incisura and gastric antrum were normal. Biopsies were taken with a       cold forceps for histology.      The cardia and  gastric fundus were normal on retroflexion.      The gastroesophageal junction and examined esophagus were normal.      The Z-line was irregular. Impression:            - Normal duodenal bulb and second portion of the                         duodenum. Biopsied.                        - Nodular mucosa in the gastric body. Biopsied.                        - Normal incisura and antrum. Biopsied.                        - Normal gastroesophageal junction and esophagus.                        - Z-line irregular. Recommendation:        - Await pathology results.                        - Proceed with colonoscopy as scheduled                        See colonoscopy report Procedure Code(s):     --- Professional ---                        [phone], Esophagogastroduodenoscopy, flexible,                         transoral; with biopsy, single or multiple Diagnosis Code(s):     --- Professional ---                        K31.89, Other diseases of stomach and duodenum                        K22.89, Other specified disease of esophagus  R63.4, Abnormal weight loss                        R19.7, Diarrhea, unspecified CPT copyright 2022 American Medical Association. All rights reserved. The codes documented in this report are preliminary and upon coder review may  be revised to meet current compliance requirements. Dr. Ulyess Mort Lin Landsman MD, MD 12/06/2022 9:23:55 AM This report has been signed electronically. Number of Addenda: 0 Note Initiated On: 12/06/2022 9:02 AM Estimated Blood Loss:  Estimated blood loss: none.      [facility]

## 2022-12-06 NOTE — Transfer of Care (Signed)
Immediate Anesthesia Transfer of Care Note  Patient: DARCEL ZICK  Procedure(s) Performed: COLONOSCOPY WITH PROPOFOL ESOPHAGOGASTRODUODENOSCOPY (EGD) WITH PROPOFOL  Patient Location: PACU and Endoscopy Unit  Anesthesia Type:General  Level of Consciousness: sedated  Airway & Oxygen Therapy: Patient Spontanous Breathing  Post-op Assessment: Report given to RN and Post -op Vital signs reviewed and stable  Post vital signs: Reviewed and stable  Last Vitals:  Vitals Value Taken Time  BP    Temp    Pulse 74 12/06/22 0943  Resp 21 12/06/22 0943  SpO2 99 % 12/06/22 0943  Vitals shown include unvalidated device data.  Last Pain:  Vitals:   12/06/22 0743  TempSrc: Temporal  PainSc: 0-No pain         Complications: No notable events documented.

## 2022-12-06 NOTE — Op Note (Signed)
[facility] Gastroenterology Patient Name: Janet Olson Procedure Date: 12/06/2022 9:02 AM MRN: [id number] Account #: [id number] Date of Birth: 09/08/1970 Admit Type: Outpatient Age: 52 Room: [facility] ENDO ROOM 3 Gender: Female Note Status: Finalized Instrument Name: Colonoscope [id number] Procedure:             Colonoscopy Indications:           Last colonoscopy: April 2019, Chronic diarrhea, Weight                         loss Providers:             Lin Landsman MD, MD Referring MD:          Jordan Likes. Lavena Bullion (Referring MD) Medicines:             General Anesthesia Complications:         No immediate complications. Estimated blood loss: None. Procedure:             Pre-Anesthesia Assessment:                        - Prior to the procedure, a History and Physical was                         performed, and patient medications and allergies were                         reviewed. The patient is competent. The risks and                         benefits of the procedure and the sedation options and                         risks were discussed with the patient. All questions                         were answered and informed consent was obtained.                         Patient identification and proposed procedure were                         verified by the physician, the nurse, the                         anesthesiologist, the anesthetist and the technician                         in the pre-procedure area in the procedure room in the                         endoscopy suite. Mental Status Examination: alert and                         oriented. Airway Examination: normal oropharyngeal                         airway and neck mobility. Respiratory Examination:  clear to auscultation. CV Examination: normal.                         Prophylactic Antibiotics: The patient does not require                         prophylactic antibiotics. Prior  Anticoagulants: The                         patient has taken Eliquis (apixaban), last dose was 4                         days prior to procedure. ASA Grade Assessment: III - A                         patient with severe systemic disease. After reviewing                         the risks and benefits, the patient was deemed in                         satisfactory condition to undergo the procedure. The                         anesthesia plan was to use general anesthesia.                         Immediately prior to administration of medications,                         the patient was re-assessed for adequacy to receive                         sedatives. The heart rate, respiratory rate, oxygen                         saturations, blood pressure, adequacy of pulmonary                         ventilation, and response to care were monitored                         throughout the procedure. The physical status of the                         patient was re-assessed after the procedure.                        After obtaining informed consent, the colonoscope was                         passed under direct vision. Throughout the procedure,                         the patient's blood pressure, pulse, and oxygen                         saturations were monitored continuously. The  Colonoscope was introduced through the anus and                         advanced to the the terminal ileum, with                         identification of the appendiceal orifice and IC                         valve. The colonoscopy was performed without                         difficulty. The patient tolerated the procedure well.                         The quality of the bowel preparation was fair. The                         terminal ileum, ileocecal valve, appendiceal orifice,                         and rectum were photographed. Findings:      The perianal and digital rectal examinations  were normal. Pertinent       negatives include normal sphincter tone and no palpable rectal lesions.      The terminal ileum appeared normal.      The terminal ileum appeared normal. Biopsies were taken with a cold       forceps for histology.      A diminutive polyp was found in the proximal ascending colon. The polyp       was sessile. The polyp was removed with a cold biopsy forceps. Resection       and retrieval were complete.      Normal mucosa was found in the entire colon. Biopsies for histology were       taken with a cold forceps from the entire colon for evaluation of       microscopic colitis. Estimated blood loss: none.      The retroflexed view of the distal rectum and anal verge was normal and       showed no anal or rectal abnormalities.      Multiple diverticula were found in the sigmoid colon. Impression:            - Preparation of the colon was fair.                        - The examined portion of the ileum was normal.                        - The examined portion of the ileum was normal.                         Biopsied.                        - One diminutive polyp in the proximal ascending                         colon, removed with a cold biopsy forceps. Resected  and retrieved.                        - Normal mucosa in the entire examined colon. Biopsied.                        - The distal rectum and anal verge are normal on                         retroflexion view.                        - Diverticulosis in the sigmoid colon. Recommendation:        - Discharge patient to home (with escort).                        - Resume previous diet today.                        - Continue present medications.                        - Await pathology results.                        - Resume Eliquis (apixaban) today at prior dose. Refer                         to managing physician for further adjustment of                         therapy. Procedure  Code(s):     --- Professional ---                        [phone], Colonoscopy, flexible; with biopsy, single or                         multiple Diagnosis Code(s):     --- Professional ---                        D12.2, Benign neoplasm of ascending colon                        K52.9, Noninfective gastroenteritis and colitis,                         unspecified                        R63.4, Abnormal weight loss                        K57.30, Diverticulosis of large intestine without                         perforation or abscess without bleeding CPT copyright 2022 American Medical Association. All rights reserved. The codes documented in this report are preliminary and upon coder review may  be revised to meet current compliance requirements. Dr. Ulyess Mort Lin Landsman MD, MD 12/06/2022 9:47:25 AM This report has been signed electronically. Number of Addenda: 0 Note  Initiated On: 12/06/2022 9:02 AM Scope Withdrawal Time: 0 hours 9 minutes 32 seconds  Total Procedure Duration: 0 hours 12 minutes 16 seconds  Estimated Blood Loss:  Estimated blood loss: none.      [facility]

## 2022-12-06 NOTE — H&P (Signed)
Cephas Darby, MD [address]  [place]  [place], [place] [address]  Main: [phone]  Fax: [phone] Pager: [phone]  Primary Care Physician:  Remi Haggard, FNP Primary Gastroenterologist:  Dr. Cephas Darby  Pre-Procedure History & Physical: HPI:  Janet Olson is a 52 y.o. female is here for an endoscopy and colonoscopy.   Past Medical History:  Diagnosis Date   (HFpEF) heart failure with preserved ejection fraction ([place])    a. 2015 Echo: Nl EF. Mild to mod LVH; b. 04/2017 Echo: EF 55-60% Gr1 DD; c. 12/2018 Echo: EF 55-60%; d. 02/2020 Echo: EF 50-55%; e. 10/2020 Echo: EF 55-60%, mild LVH, mildly enlarged RV, sev elev RVSP, BAE, mild MR; 12/2020 Echo: EF 65%, sev dil RA, dil LA, RVSP 29.20mHg, MR/PR.   Abdominal ascites    Acute [place]emic respiratory failure ([place] 01/03/2019   Acute renal failure (ARF) ([place] 05/17/2017   Acute respiratory failure ([place] 09/25/2018   Anemia    Anxiety    Arthritis    Asthma    Atrial flutter ([place]    a. Dx 03/2022-->CHA2DS2VASc = 3-->eliquis.   Cardiomegaly    COPD (chronic obstructive pulmonary disease) (HCC)    Depression    [place] ([place]    MON, WED , FRI   Dyspnea    ESRD (end stage renal disease) (HCC)    GERD (gastroesophageal reflux disease)    Headache    Migraines   Hypertension    Nonrheumatic tricuspid (valve) insufficiency    Pericardial effusion 01/26/2019   Respiratory failure with hypoxia ([place] 04/21/2018   Ulcer    gastric   Vitamin D deficiency     Past Surgical History:  Procedure Laterality Date   A/V FISTULAGRAM Left 04/17/2018   Procedure: A/V FISTULAGRAM;  Surgeon: DAlgernon Huxley MD;  [place] [place];  Service: Cardiovascular;  Laterality: Left;   A/V FISTULAGRAM Left 05/27/2018   Procedure: A/V FISTULAGRAM;  Surgeon: SKatha Cabal MD;  [place];  Service: Cardiovascular;  Laterality: Left;   A/V FISTULAGRAM Left 07/05/2020   Procedure: A/V  FISTULAGRAM;  Surgeon: SKatha Cabal MD;  [place];  Service: Cardiovascular;  Laterality: Left;   A/V FISTULAGRAM Left 06/26/2022   Procedure: A/V Fistulagram;  Surgeon: SKatha Cabal MD;  [place];  Service: Cardiovascular;  Laterality: Left;   AV FISTULA PLACEMENT Left 08/08/2017   Procedure: ARTERIOVENOUS (AV) FISTULA CREATION ( BRACHIOCEPHALIC );  Surgeon: DAlgernon Huxley MD;  Location: [facility];  Service: Vascular;  Laterality: Left;   AV FISTULA PLACEMENT Left 08/05/2020   Procedure: INSERTION OF ARTERIOVENOUS (AV) GRAFT ARM(BRACHIAL AXILLARY);  Surgeon: SKatha Cabal MD;  Location: [facility] OR[facility];  Service: Vascular;  Laterality: Left;   CARDIOVERSION N/A 05/01/2022   Procedure: CARDIOVERSION;  Surgeon: GMinna Merritts MD;  Location: [facility] ORS;  Service: Cardiovascular;  Laterality: N/A;   CESAREAN SECTION     x 4 (1988, 1991, 1997, 2000 )   COLONOSCOPY WITH PROPOFOL N/A 04/24/2018   Procedure: COLONOSCOPY WITH PROPOFOL;  Surgeon: VLin Landsman MD;  [facility];  Service: Gastroenterology;  Laterality: N/A;   DIALYSIS/PERMA CATHETER INSERTION N/A 07/16/2017   Procedure: Dialysis/Perma Catheter Insertion;  Surgeon: SKatha Cabal MD;  [place];  Service: Cardiovascular;  Laterality: N/A;   DIALYSIS/PERMA CATHETER REMOVAL N/A 10/29/2017   Procedure: DIALYSIS/PERMA CATHETER REMOVAL;  Surgeon: SKatha Cabal MD;  Location:  [place] CV LAB;  Service: Cardiovascular;  Laterality: N/A;   DIALYSIS/PERMA CATHETER REMOVAL N/A 11/03/2020   Procedure: DIALYSIS/PERMA CATHETER REMOVAL;  Surgeon: Algernon Huxley, MD;  Location: [place] CV LAB;  Service: Cardiovascular;  Laterality: N/A;   excision of lymph nodes Bilateral 2014   bilateral under arms.   PARATHYROID IMPLANT REMOVAL     REMOVAL OF A DIALYSIS CATHETER Left 08/05/2020   Procedure: REMOVAL OF A DIALYSIS CATHETER ( FISTULA );  Surgeon:  Katha Cabal, MD;  Location: [facility];  Service: Vascular;  Laterality: Left;   SHOULDER ARTHROSCOPY WITH SUBACROMIAL DECOMPRESSION AND OPEN ROTATOR C Right 01/08/2022   Procedure: Right shoulder arthroscopic subscapularis repair, rotator cuff repair, subacromial decompression, and distal clavicle excision;  Surgeon: Leim Fabry, MD;  Location: [facility];  Service: Orthopedics;  Laterality: Right;   TEE WITHOUT CARDIOVERSION N/A 05/01/2022   Procedure: TRANSESOPHAGEAL ECHOCARDIOGRAM (TEE);  Surgeon: Minna Merritts, MD;  Location: [facility]S;  Service: Cardiovascular;  Laterality: N/A;    Prior to Admission medications   Medication Sig Start Date End Date Taking? Authorizing Provider  acetaminophen (TYLENOL) 500 MG tablet Take 2 tablets (1,000 mg total) by mouth every 8 (eight) hours. 01/08/22 01/08/23 Yes Leim Fabry, MD  albuterol (PROVENTIL) (2.5 MG/3ML) 0.083% nebulizer solution Take 2.5 mg by nebulization every 6 (six) hours as needed for wheezing or shortness of breath.    Yes [provider]  albuterol (VENTOLIN HFA) 108 (90 Base) MCG/ACT inhaler Inhale 2 puffs into the lungs every 6 (six) hours as needed for wheezing or shortness of breath.  12/22/19  Yes [provider]  amLODipine (NORVASC) 10 MG tablet Take 10 mg by mouth daily. 05/29/22  Yes [provider]  apixaban (ELIQUIS) 5 MG TABS tablet Take 1 tablet (5 mg total) by mouth 2 (two) times daily. 04/27/22  Yes Theora Gianotti, NP  benzonatate (TESSALON) 200 MG capsule Take 400 mg by mouth 3 (three) times daily as needed for cough.   Yes [provider]  BREZTRI AEROSPHERE 160-9-4.8 MCG/ACT AERO SMARTSIG:2 Puff(s) By Mouth Twice Daily 10/29/22  Yes [provider]  CAPLYTA 10.5 MG CAPS Take 1 capsule by mouth daily. 09/06/22  Yes [provider]  Cholecalciferol 125 MCG (5000 UT) capsule Take by mouth. 03/07/22  Yes [provider]  cinacalcet (SENSIPAR) 30 MG tablet  Take 1 tablet (30 mg total) by mouth every Monday, Wednesday, and Friday. 04/13/22  Yes Shawna Clamp, MD  DULERA 200-5 MCG/ACT AERO Inhale 2 puffs into the lungs 2 (two) times daily. 01/16/18  Yes [provider]  Heparin Sodium, Porcine, (HEPARIN LOCK FLUSH IJ) Inject 1 Dose as directed every Monday, Wednesday, and Friday. At dialysis   Yes [provider]  hydrOXYzine (ATARAX) 10 MG tablet Take 10 mg by mouth 2 (two) times daily as needed. 11/09/22  Yes [provider]  hydrOXYzine (ATARAX) 25 MG tablet Take by mouth. 04/23/22  Yes [provider]  lidocaine-prilocaine (EMLA) cream 1 application as needed (prior to port being accessed). 12/15/20  Yes [provider]  linaclotide (LINZESS) 72 MCG capsule Take 72 mcg by mouth every evening.   Yes [provider]  lumateperone tosylate (CAPLYTA) 42 MG capsule Take 42 mg by mouth at bedtime.   Yes [provider]  metoprolol tartrate (LOPRESSOR) 100 MG tablet Take 50 mg by mouth 2 (two) times daily.   Yes [provider]  multivitamin (RENA-VIT) TABS tablet Take 1 tablet by  mouth daily.  08/23/17  Yes [provider]  pantoprazole (PROTONIX) 40 MG tablet Take 1 tablet (40 mg total) by mouth daily. 12/20/18  Yes Schuyler Amor, MD  polyethylene glycol powder (GLYCOLAX/MIRALAX) 17 GM/SCOOP powder Take 255 g by mouth daily. 06/05/22  Yes Grizel Vesely, Tally Due, MD  QUEtiapine (SEROQUEL) 100 MG tablet Take 100-200 mg by mouth at bedtime as needed (sleep). 11/24/21  Yes [provider]  QUEtiapine (SEROQUEL) 25 MG tablet Take 50 mg by mouth at bedtime as needed (sleep). 05/24/20  Yes [provider]  torsemide (DEMADEX) 100 MG tablet Take 1 tablet (100 mg total) by mouth daily. 09/27/18  Yes Loletha Grayer, MD    Allergies as of 11/19/2022   (No Known Allergies)    Family History  Problem Relation Age of Onset   Diabetes Mother     Social History    Socioeconomic History   Marital status: Single    Spouse name: Not on file   Number of children: Not on file   Years of education: Not on file   Highest education level: Not on file  Occupational History   Not on file  Tobacco Use   Smoking status: Every Day    Packs/day: 0.50    Years: 30.00    Total pack years: 15.00    Types: Cigarettes   Smokeless tobacco: Never  Vaping Use   Vaping Use: Never used  Substance and Sexual Activity   Alcohol use: No   Drug use: No   Sexual activity: Not Currently  Other Topics Concern   Not on file  Social History Narrative   Not on file   Social Determinants of Health   Financial Resource Strain: Not on file  Food Insecurity: Not on file  Transportation Needs: Not on file  Physical Activity: Not on file  Stress: Not on file  Social Connections: Not on file  Intimate Partner Violence: Not on file    Review of Systems: See HPI, otherwise negative ROS  Physical Exam: BP 128/70   Pulse 72   Temp (!) 96.7 F (35.9 C) (Temporal)   Resp 18   Ht '5\' 6"'$  (1.676 m)   Wt 85.3 kg   LMP 03/03/2018 (Approximate)   SpO2 99%   BMI 30.34 kg/m  General:   Alert,  pleasant and cooperative in NAD Head:  Normocephalic and atraumatic. Neck:  Supple; no masses or thyromegaly. Lungs:  Clear throughout to auscultation.    Heart:  Regular rate and rhythm. Abdomen:  Soft, nontender and nondistended. Normal bowel sounds, without guarding, and without rebound.   Neurologic:  Alert and  oriented x4;  grossly normal neurologically.  Impression/Plan: Janet Olson is here for an endoscopy and colonoscopy to be performed for chronic diarrhea  Risks, benefits, limitations, and alternatives regarding  endoscopy and colonoscopy have been reviewed with the patient.  Questions have been answered.  All parties agreeable.   Sherri Sear, MD  12/06/2022, 8:07 AM

## 2022-12-06 NOTE — Anesthesia Procedure Notes (Signed)
Procedure Name: MAC Date/Time: 12/06/2022 9:07 AM  Performed by: Biagio Borg, CRNAPre-anesthesia Checklist: Patient identified, Emergency Drugs available, Suction available, Patient being monitored and Timeout performed Patient Re-evaluated:Patient Re-evaluated prior to induction Oxygen Delivery Method: Nasal cannula Induction Type: IV induction Placement Confirmation: positive ETCO2 and CO2 detector

## 2022-12-06 NOTE — Anesthesia Preprocedure Evaluation (Signed)
Anesthesia Evaluation  Patient identified by MRN, date of birth, ID band Patient awake    Reviewed: Allergy & Precautions, NPO status , Patient's Chart, lab work & pertinent test results  History of Anesthesia Complications Negative for: history of anesthetic complications  Airway Mallampati: III  TM Distance: >3 FB Neck ROM: full    Dental  (+) Chipped, Poor Dentition, Missing   Pulmonary shortness of breath and with exertion, asthma , COPD, Current Smoker and Patient abstained from smoking.   breath sounds clear to auscultation+ rhonchi        Cardiovascular hypertension, (-) angina +CHF  Normal cardiovascular exam     Neuro/Psych  Headaches PSYCHIATRIC DISORDERS         GI/Hepatic Neg liver ROS,GERD  Controlled,,  Endo/Other  negative endocrine ROS    Renal/GU Renal disease  negative genitourinary   Musculoskeletal   Abdominal   Peds  Hematology negative hematology ROS (+)   Anesthesia Other Findings Past Medical History: No date: (HFpEF) heart failure with preserved ejection fraction ([place])     Comment:  a. 2015 Echo: Nl EF. Mild to mod LVH; b. 04/2017 Echo: EF              55-60% Gr1 DD; c. 12/2018 Echo: EF 55-60%; d. 02/2020 Echo:              EF 50-55%; e. 10/2020 Echo: EF 55-60%, mild LVH, mildly               enlarged RV, sev elev RVSP, BAE, mild MR; 12/2020 Echo: EF              65%, sev dil RA, dil LA, RVSP 29.71mHg, MR/PR. No date: Abdominal ascites 01/03/2019: Acute hypoxemic respiratory failure ([place] 05/17/2017: Acute renal failure (ARF) ([place] 09/25/2018: Acute respiratory failure (HCC) No date: Anemia No date: Anxiety No date: Arthritis No date: Asthma No date: Atrial flutter ([place]     Comment:  a. Dx 03/2022-->CHA2DS2VASc = 3-->eliquis. No date: Cardiomegaly No date: COPD (chronic obstructive pulmonary disease) (HCC) No date: Depression No date: Dialysis patient ([place]     Comment:  MON, WED ,  FRI No date: Dyspnea No date: ESRD (end stage renal disease) ([place] No date: GERD (gastroesophageal reflux disease) No date: Headache     Comment:  Migraines No date: Hypertension No date: Nonrheumatic tricuspid (valve) insufficiency 01/26/2019: Pericardial effusion 04/21/2018: Respiratory failure with hypoxia (HCC) No date: Ulcer     Comment:  gastric No date: Vitamin D deficiency  Past Surgical History: 04/17/2018: A/V FISTULAGRAM; Left     Comment:  Procedure: A/V FISTULAGRAM;  Surgeon: DAlgernon Huxley MD;               [facility];  Service: Cardiovascular;              Laterality: Left; 05/27/2018: A/V FISTULAGRAM; Left     Comment:  Procedure: A/V FISTULAGRAM;  Surgeon: SKatha Cabal MD;  [place];  Service:               Cardiovascular;  Laterality: Left; 07/05/2020: A/V FISTULAGRAM; Left     Comment:  Procedure: A/V FISTULAGRAM;  Surgeon: SKatha Cabal MD;  [facility];  Service:  Cardiovascular;  Laterality: Left; 06/26/2022: A/V FISTULAGRAM; Left     Comment:  Procedure: A/V Fistulagram;  Surgeon: Katha Cabal, MD;  Location: [place] [facility] LAB;  Service:               Cardiovascular;  Laterality: Left; 08/08/2017: AV FISTULA PLACEMENT; Left     Comment:  Procedure: ARTERIOVENOUS (AV) FISTULA CREATION (               BRACHIOCEPHALIC );  Surgeon: Algernon Huxley, MD;  Location:              [facility];  Service: Vascular;  Laterality: Left; 08/05/2020: AV FISTULA PLACEMENT; Left     Comment:  Procedure: INSERTION OF ARTERIOVENOUS (AV) GRAFT               ARM(BRACHIAL AXILLARY);  Surgeon: Katha Cabal, MD;              Location: [facility];  Service: Vascular;  Laterality:               Left; 05/01/2022: CARDIOVERSION; N/A     Comment:  Procedure: CARDIOVERSION;  Surgeon: Minna Merritts,               MD;  Location: [facility];  Service:  Cardiovascular;                Laterality: N/A; No date: CESAREAN SECTION     Comment:  x 4 (1988, 1991, 1997, 2000 ) 04/24/2018: COLONOSCOPY WITH PROPOFOL; N/A     Comment:  Procedure: COLONOSCOPY WITH PROPOFOL;  Surgeon: Lin Landsman, MD;  Location: [facility];  Service:               Gastroenterology;  Laterality: N/A; 07/16/2017: DIALYSIS/PERMA CATHETER INSERTION; N/A     Comment:  Procedure: Dialysis/Perma Catheter Insertion;  Surgeon:               Katha Cabal, MD;  Location: [place] [facility] LAB;               Service: Cardiovascular;  Laterality: N/A; 10/29/2017: DIALYSIS/PERMA CATHETER REMOVAL; N/A     Comment:  Procedure: DIALYSIS/PERMA CATHETER REMOVAL;  Surgeon:               Katha Cabal, MD;  Location: [place] [facility];               Service: Cardiovascular;  Laterality: N/A; 11/03/2020: DIALYSIS/PERMA CATHETER REMOVAL; N/A     Comment:  Procedure: DIALYSIS/PERMA CATHETER REMOVAL;  Surgeon:               Algernon Huxley, MD;  Location: [place] [facility] LAB;                Service: Cardiovascular;  Laterality: N/A; 2014: excision of lymph nodes; Bilateral     Comment:  bilateral under arms. No date: PARATHYROID IMPLANT REMOVAL 08/05/2020: REMOVAL OF A DIALYSIS CATHETER; Left     Comment:  Procedure: REMOVAL OF A DIALYSIS CATHETER ( FISTULA );                Surgeon: Katha Cabal, MD;  Location: [facility];                Service: Vascular;  Laterality: Left; 01/08/2022:  SHOULDER ARTHROSCOPY WITH SUBACROMIAL DECOMPRESSION AND  OPEN ROTATOR C; Right     Comment:  Procedure: Right shoulder arthroscopic subscapularis               repair, rotator cuff repair, subacromial decompression,               and distal clavicle excision;  Surgeon: Leim Fabry, MD;              Location: [facility];  Service: Orthopedics;  Laterality:               Right; 05/01/2022: TEE WITHOUT CARDIOVERSION; N/A     Comment:  Procedure: TRANSESOPHAGEAL  ECHOCARDIOGRAM (TEE);                Surgeon: Minna Merritts, MD;  Location: [facility];                Service: Cardiovascular;  Laterality: N/A;  BMI    Body Mass Index: 30.34 kg/m      Reproductive/Obstetrics negative OB ROS                             Anesthesia Physical Anesthesia Plan  ASA: 4  Anesthesia Plan: General   Post-op Pain Management:    Induction: Intravenous  PONV Risk Score and Plan: Propofol infusion and TIVA  Airway Management Planned: Natural Airway and Nasal Cannula  Additional Equipment:   Intra-op Plan:   Post-operative Plan:   Informed Consent: I have reviewed the patients History and Physical, chart, labs and discussed the procedure including the risks, benefits and alternatives for the proposed anesthesia with the patient or authorized representative who has indicated his/her understanding and acceptance.     Dental Advisory Given  Plan Discussed with: Anesthesiologist, CRNA and Surgeon  Anesthesia Plan Comments: (Patient consented for risks of anesthesia including but not limited to:  - adverse reactions to medications - risk of airway placement if required - damage to eyes, teeth, lips or other oral mucosa - nerve damage due to positioning  - sore throat or hoarseness - Damage to heart, brain, nerves, lungs, other parts of body or loss of life  Patient voiced understanding.)       Anesthesia Quick Evaluation

## 2022-12-06 NOTE — Anesthesia Postprocedure Evaluation (Signed)
Anesthesia Post Note  Patient: Janet Olson  Procedure(s) Performed: COLONOSCOPY WITH PROPOFOL ESOPHAGOGASTRODUODENOSCOPY (EGD) WITH PROPOFOL  Patient location during evaluation: Endoscopy Anesthesia Type: General Level of consciousness: awake and alert Pain management: pain level controlled Vital Signs Assessment: post-procedure vital signs reviewed and stable Respiratory status: spontaneous breathing, nonlabored ventilation, respiratory function stable and patient connected to nasal cannula oxygen Cardiovascular status: blood pressure returned to baseline and stable Postop Assessment: no apparent nausea or vomiting Anesthetic complications: no   No notable events documented.   Last Vitals:  Vitals:   12/06/22 0953 12/06/22 1003  BP: 136/79   Pulse:  71  Resp:    Temp:    SpO2:  99%    Last Pain:  Vitals:   12/06/22 1003  TempSrc:   PainSc: 0-No pain                 Precious Haws Eretria Manternach

## 2022-12-07 ENCOUNTER — Encounter: Payer: Self-pay | Admitting: Gastroenterology

## 2022-12-07 LAB — SURGICAL PATHOLOGY

## 2022-12-10 ENCOUNTER — Other Ambulatory Visit (INDEPENDENT_AMBULATORY_CARE_PROVIDER_SITE_OTHER): Payer: Self-pay | Admitting: Nurse Practitioner

## 2022-12-10 ENCOUNTER — Telehealth: Payer: Self-pay

## 2022-12-10 DIAGNOSIS — N186 End stage renal disease: Secondary | ICD-10-CM

## 2022-12-10 DIAGNOSIS — K529 Noninfective gastroenteritis and colitis, unspecified: Secondary | ICD-10-CM

## 2022-12-10 NOTE — Telephone Encounter (Signed)
Order the lab test.called and patient number kept ringing then the phone disconnected

## 2022-12-10 NOTE — Telephone Encounter (Signed)
Called patient and left a message for call back  

## 2022-12-10 NOTE — Telephone Encounter (Signed)
-----   Message from Lin Landsman, MD sent at 12/10/2022 12:23 PM EST ----- Please inform patient that the pathology results from upper endoscopy and colonoscopy came back normal.  We still do not have explanation for her chronic diarrhea.  Recommend pancreatic fecal elastase levels to assess her pancreatic function which can also lead to diarrhea  RV

## 2022-12-11 ENCOUNTER — Ambulatory Visit (INDEPENDENT_AMBULATORY_CARE_PROVIDER_SITE_OTHER): Payer: 59 | Admitting: Nurse Practitioner

## 2022-12-11 ENCOUNTER — Encounter (INDEPENDENT_AMBULATORY_CARE_PROVIDER_SITE_OTHER): Payer: Self-pay | Admitting: Nurse Practitioner

## 2022-12-11 ENCOUNTER — Ambulatory Visit (INDEPENDENT_AMBULATORY_CARE_PROVIDER_SITE_OTHER): Payer: 59

## 2022-12-11 VITALS — BP 126/72 | HR 77 | Resp 17 | Ht 66.0 in | Wt 192.0 lb

## 2022-12-11 DIAGNOSIS — N186 End stage renal disease: Secondary | ICD-10-CM | POA: Diagnosis not present

## 2022-12-11 DIAGNOSIS — I10 Essential (primary) hypertension: Secondary | ICD-10-CM

## 2022-12-11 DIAGNOSIS — M25511 Pain in right shoulder: Secondary | ICD-10-CM | POA: Diagnosis not present

## 2022-12-11 NOTE — Telephone Encounter (Signed)
Called and informed patient of this information she verbalized understanding and states she will come by and pick up the stool kit. Gave her the lab hours

## 2022-12-16 ENCOUNTER — Other Ambulatory Visit: Payer: Self-pay

## 2022-12-16 ENCOUNTER — Emergency Department
Admission: EM | Admit: 2022-12-16 | Discharge: 2022-12-16 | Disposition: A | Discharge status: Home / Self Care | Payer: 59 | Attending: Emergency Medicine | Admitting: Emergency Medicine

## 2022-12-16 DIAGNOSIS — I4891 Unspecified atrial fibrillation: Secondary | ICD-10-CM | POA: Diagnosis not present

## 2022-12-16 DIAGNOSIS — Z7901 Long term (current) use of anticoagulants: Secondary | ICD-10-CM | POA: Diagnosis not present

## 2022-12-16 DIAGNOSIS — R040 Epistaxis: Secondary | ICD-10-CM | POA: Diagnosis present

## 2022-12-16 MED ORDER — OXYMETAZOLINE HCL 0.05 % NA SOLN
1.0000 | Freq: Once | NASAL | Status: AC
  Administered 2022-12-16: 1 via NASAL
  Filled 2022-12-16: qty 30

## 2022-12-16 MED ORDER — SALINE SPRAY 0.65 % NA SOLN: 1.0000 | NASAL | 0 refills | Status: DC | PRN

## 2022-12-16 NOTE — ED Triage Notes (Signed)
Pt reports nose started bleeding last night around midnight and has not stopped. Pt reports that she takes Eloquis daily as well. Nose is currently not bleeding. Pt reports EMS gave her afrin. Denies pain, SOB or other concerns reports could not get her nose to stop

## 2022-12-16 NOTE — Discharge Instructions (Addendum)
Use Afrin 2 sprays in the nose and clamp for 30 minutes for nose bleed.   Use saline spray before bed for moisturizing the nose.   Thank you for choosing [place] for your health care today!  Please see your primary doctor this week for a follow up appointment.   Sometimes, in the early stages of certain disease courses it is difficult to detect in the emergency department evaluation -- so, it is important that you continue to monitor your symptoms and call your doctor right away or return to the emergency department if you develop any new or worsening symptoms.  Please go to the following website to schedule new (and existing) patient appointments:   [email or web address]  If you do not have a primary doctor try calling the following clinics to establish care:  If you have insurance:  [facility] [phone] [place] [address]   [facility]  [phone] [place] [place] [address]   If you do not have insurance:  [facility]  [phone] [address], [place] [address]   The following is another list of primary care offices in the area who are accepting new patients at this time.  Please reach out to one of them directly and let them know you would like to schedule an appointment to follow up on an Emergency Department visit, and/or to establish a new primary care provider (PCP).  There are likely other primary care clinics in the are who are accepting new patients, but this is an excellent place to start:  [place] physician: Dr Lavon Paganini [address] [place], [place] [address] [phone]  [facility] Lead Physician: Dr Steele Sizer [address], [place], [place] [address] [phone]  [place] Physician: Dr Park Liter [address] [place], [place] [address] [phone]  [facility] Lead Physician: Dr Dewaine Oats [place], [place], [place] [address] [phone]  [facility] [place] Physician: Dr Halina Maidens [address] [place], [place] [address] [phone]   It was my pleasure to care for you today.   Hoover Brunette Jacelyn Grip, MD

## 2022-12-16 NOTE — ED Triage Notes (Signed)
Pt in via EMS from home with c/o nodebleed since midnight. EMS reports pt was given 2 sprays of afrin in each nostril. Pt takes Eloqius. 154/80

## 2022-12-16 NOTE — ED Provider Notes (Signed)
[facility] Provider Note    Event Date/Time   First MD Initiated Contact with Patient 12/16/22 [phone]     (approximate)   History   Epistaxis   HPI  Janet Olson is a 52 y.o. female   Past medical history of trill fibrillation on Eliquis among other medical comorbidities who presents with a nosebleed.  Started last night and has been oozing throughout the night and right nares.  No trauma.  She has dry heat at home which she thinks caused it.  EMS put Afrin and nasal clamp which stopped the bleeding.  She has no other medical complaints acutely.   History was obtained via the patient.      Physical Exam   Triage Vital Signs: ED Triage Vitals  Enc Vitals Group     BP 12/16/22 0749 121/72     Pulse Rate 12/16/22 0749 81     Resp 12/16/22 0749 20     Temp 12/16/22 0749 98.7 F (37.1 C)     Temp Source 12/16/22 0749 Oral     SpO2 12/16/22 0749 96 %     Weight 12/16/22 0748 191 lb 12.8 oz (87 kg)     Height 12/16/22 0748 '5\' 6"'$  (1.676 m)     Head Circumference --      Peak Flow --      Pain Score 12/16/22 0748 0     Pain Loc --      Pain Edu? --      Excl. in [place]? --     Most recent vital signs: Vitals:   12/16/22 0749  BP: 121/72  Pulse: 81  Resp: 20  Temp: 98.7 F (37.1 C)  SpO2: 96%    General: Awake, no distress.  CV:  Good peripheral perfusion.  Resp:  Normal effort.  Abd:  No distention.  Other:  Dried blood in the right nares, not in the posterior oropharynx, awake alert oriented with no active bleeding and no lesions for cautery identified.  Phonation is normal.   ED Results / Procedures / Treatments   Labs (all labs ordered are listed, but only abnormal results are displayed) Labs Reviewed - No data to display    PROCEDURES:  Critical Care performed: No  Procedures   MEDICATIONS ORDERED IN ED: Medications  oxymetazoline (AFRIN) 0.05 % nasal spray 1 spray (1 spray Each Nare Given by Other 12/16/22 0811)     IMPRESSION / MDM / [place] / ED COURSE  I reviewed the triage vital signs and the nursing notes.                              Differential diagnosis includes, but is not limited to, epistaxis, blood loss   MDM: Nosebleed has stopped without further intervention from [place] in the emergency department.  Doubt significant blood loss given history of oozing, patient appears well, hemodynamics appropriate and reassuring.  I gave her tools including Afrin and a nose clamp to manage epistaxis at home and gave her return precautions for uncontrolled bleeding or signs of blood loss.   Patient's presentation is most consistent with acute presentation with potential threat to life or bodily function.       FINAL CLINICAL IMPRESSION(S) / ED DIAGNOSES   Final diagnoses:  Epistaxis     Rx / DC Orders   ED Discharge Orders          Ordered  sodium chloride (OCEAN) 0.65 % SOLN nasal spray  As needed        12/16/22 0815             Note:  This document was prepared using Dragon voice recognition software and may include unintentional dictation errors.    Lucillie Garfinkel, MD 12/16/22 [phone]

## 2022-12-17 ENCOUNTER — Telehealth: Payer: Self-pay

## 2022-12-17 LAB — PANCREATIC ELASTASE, FECAL: Pancreatic Elastase, Fecal: 431 ug Elast./g (ref >200)

## 2022-12-17 NOTE — Telephone Encounter (Signed)
-----   Message from Lin Landsman, MD sent at 12/17/2022  2:11 PM EST ----- Please inform patient that her stool test for pancreatic function also came back normal.  If she is still having diarrhea, let's try 2 weeks course of Xifaxan 550 mg 3 times daily  RV

## 2022-12-17 NOTE — Telephone Encounter (Signed)
Called and left a message with the person that answer to give [place] a call back

## 2022-12-18 NOTE — Telephone Encounter (Signed)
Tried to call patient number kept ringing and then disconnected

## 2022-12-19 MED ORDER — RIFAXIMIN 550 MG PO TABS: 550.0000 mg | ORAL_TABLET | Freq: Three times a day (TID) | ORAL | 0 refills | Status: AC

## 2022-12-19 NOTE — Telephone Encounter (Signed)
Tried to call patient but number kept ringing and the disconnect unable to leave message. Tried 4 times please advise if you want me to send a letter to the patient and send medication to the pharmacy

## 2022-12-19 NOTE — Addendum Note (Signed)
Addended by: Ulyess Blossom L on: 12/19/2022 11:10 AM   Modules accepted: Orders

## 2022-12-19 NOTE — Telephone Encounter (Signed)
Sent medication to the pharmacy and sent a letter to patient

## 2022-12-20 NOTE — Telephone Encounter (Signed)
Patient verbalized understanding of results she will pick up medication

## 2022-12-26 ENCOUNTER — Encounter (INDEPENDENT_AMBULATORY_CARE_PROVIDER_SITE_OTHER): Payer: Self-pay | Admitting: Nurse Practitioner

## 2022-12-26 NOTE — Progress Notes (Signed)
Subjective:    Patient ID: Janet Olson, female    DOB: 09-Feb-1970, 52 y.o.   MRN: [id number] Chief Complaint  Patient presents with   Follow-up    ultrasound    Janet Olson is a 52 year old female that returns today after recent fistulogram.  She notes that her dialysis access has improved.  Her biggest complaint however is of pain and numbness and tingling in the hand itself.  She notes that it tends to be worse during dialysis.  She also notes pain and discomfort in her neck and shoulder area as well.  Today she notes that her dialysis access has been functioning well and that the pain and numbness that she prior to has not been affecting her.  Today noninvasive studies show flow volume of 2804.  The previous flow volume is not seen to suggest that it has clotted.       Review of Systems  Musculoskeletal:  Positive for arthralgias.  All other systems reviewed and are negative.      Objective:   Physical Exam Vitals reviewed.  HENT:     Head: Normocephalic.  Cardiovascular:     Rate and Rhythm: Normal rate.  Pulmonary:     Effort: Pulmonary effort is normal.  Skin:    General: Skin is warm and dry.  Neurological:     Mental Status: She is alert and oriented to person, place, and time.  Psychiatric:        Mood and Affect: Mood normal.        Behavior: Behavior normal.        Thought Content: Thought content normal.        Judgment: Judgment normal.     BP 126/72 (BP Location: Right Arm)   Pulse 77   Resp 17   Ht '5\' 6"'$  (1.676 m)   Wt 192 lb (87.1 kg)   LMP 03/03/2018 (Approximate)   BMI 30.99 kg/m   Past Medical History:  Diagnosis Date   (HFpEF) heart failure with preserved ejection fraction ([place])    a. 2015 Echo: Nl EF. Mild to mod LVH; b. 04/2017 Echo: EF 55-60% Gr1 DD; c. 12/2018 Echo: EF 55-60%; d. 02/2020 Echo: EF 50-55%; e. 10/2020 Echo: EF 55-60%, mild LVH, mildly enlarged RV, sev elev RVSP, BAE, mild MR; 12/2020 Echo: EF 65%, sev dil RA, dil LA,  RVSP 29.21mHg, MR/PR.   Abdominal ascites    Acute hypoxemic respiratory failure ([facility] 01/03/2019   Acute renal failure (ARF) ([facility] 05/17/2017   Acute respiratory failure ([facility] 09/25/2018   Anemia    Anxiety    Arthritis    Asthma    Atrial flutter ([facility]    a. Dx 03/2022-->CHA2DS2VASc = 3-->eliquis.   Cardiomegaly    COPD (chronic obstructive pulmonary disease) (HCC)    Depression    [facility] ([facility]    MON, WED , FRI   Dyspnea    ESRD (end stage renal disease) (HCC)    GERD (gastroesophageal reflux disease)    Headache    Migraines   Hypertension    Nonrheumatic tricuspid (valve) insufficiency    Pericardial effusion 01/26/2019   Respiratory failure with hypoxia ([facility] 04/21/2018   Ulcer    gastric   Vitamin D deficiency     Social History   Socioeconomic History   Marital status: Single    Spouse name: Not on file   Number of children: Not on file   Years of education: Not on file   Highest  education level: Not on file  Occupational History   Not on file  Tobacco Use   Smoking status: Every Day    Packs/day: 0.50    Years: 30.00    Total pack years: 15.00    Types: Cigarettes   Smokeless tobacco: Never  Vaping Use   Vaping Use: Never used  Substance and Sexual Activity   Alcohol use: No   Drug use: No   Sexual activity: Not Currently  Other Topics Concern   Not on file  Social History Narrative   Not on file   Social Determinants of Health   Financial Resource Strain: Not on file  Food Insecurity: Not on file  Transportation Needs: Not on file  Physical Activity: Not on file  Stress: Not on file  Social Connections: Not on file  Intimate Partner Violence: Not on file    Past Surgical History:  Procedure Laterality Date   A/V FISTULAGRAM Left 04/17/2018   Procedure: A/V FISTULAGRAM;  Surgeon: Algernon Huxley, MD;  Location: [place] [facility];  Service: Cardiovascular;  Laterality: Left;   A/V FISTULAGRAM Left 05/27/2018   Procedure: A/V  FISTULAGRAM;  Surgeon: Katha Cabal, MD;  Location: [place] [facility];  Service: Cardiovascular;  Laterality: Left;   A/V FISTULAGRAM Left 07/05/2020   Procedure: A/V FISTULAGRAM;  Surgeon: Katha Cabal, MD;  Location: [place] [facility];  Service: Cardiovascular;  Laterality: Left;   A/V FISTULAGRAM Left 06/26/2022   Procedure: A/V Fistulagram;  Surgeon: Katha Cabal, MD;  Location: [place] [facility];  Service: Cardiovascular;  Laterality: Left;   AV FISTULA PLACEMENT Left 08/08/2017   Procedure: ARTERIOVENOUS (AV) FISTULA CREATION ( BRACHIOCEPHALIC );  Surgeon: Algernon Huxley, MD;  Location: [facility];  Service: Vascular;  Laterality: Left;   AV FISTULA PLACEMENT Left 08/05/2020   Procedure: INSERTION OF ARTERIOVENOUS (AV) GRAFT ARM(BRACHIAL AXILLARY);  Surgeon: Katha Cabal, MD;  Location: [facility];  Service: Vascular;  Laterality: Left;   CARDIOVERSION N/A 05/01/2022   Procedure: CARDIOVERSION;  Surgeon: Minna Merritts, MD;  Location: [facility];  Service: Cardiovascular;  Laterality: N/A;   CESAREAN SECTION     x 4 (1988, 1991, 1997, 2000 )   COLONOSCOPY WITH PROPOFOL N/A 04/24/2018   Procedure: COLONOSCOPY WITH PROPOFOL;  Surgeon: Lin Landsman, MD;  Location: [facility] ENDOSCOPY;  Service: Gastroenterology;  Laterality: N/A;   COLONOSCOPY WITH PROPOFOL N/A 12/06/2022   Procedure: COLONOSCOPY WITH PROPOFOL;  Surgeon: Lin Landsman, MD;  Location: [facility] ENDOSCOPY;  Service: Gastroenterology;  Laterality: N/A;   DIALYSIS/PERMA CATHETER INSERTION N/A 07/16/2017   Procedure: Dialysis/Perma Catheter Insertion;  Surgeon: Katha Cabal, MD;  Location: [place] [facility];  Service: Cardiovascular;  Laterality: N/A;   DIALYSIS/PERMA CATHETER REMOVAL N/A 10/29/2017   Procedure: DIALYSIS/PERMA CATHETER REMOVAL;  Surgeon: Katha Cabal, MD;  Location: [place] [facility];  Service: Cardiovascular;  Laterality: N/A;   DIALYSIS/PERMA CATHETER REMOVAL N/A 11/03/2020    Procedure: DIALYSIS/PERMA CATHETER REMOVAL;  Surgeon: Algernon Huxley, MD;  Location: [place] [facility];  Service: Cardiovascular;  Laterality: N/A;   ESOPHAGOGASTRODUODENOSCOPY (EGD) WITH PROPOFOL N/A 12/06/2022   Procedure: ESOPHAGOGASTRODUODENOSCOPY (EGD) WITH PROPOFOL;  Surgeon: Lin Landsman, MD;  Location: [facility] ENDOSCOPY;  Service: Gastroenterology;  Laterality: N/A;   excision of lymph nodes Bilateral 2014   bilateral under arms.   PARATHYROID IMPLANT REMOVAL     REMOVAL OF A DIALYSIS CATHETER Left 08/05/2020   Procedure: REMOVAL OF A DIALYSIS CATHETER (  FISTULA );  Surgeon: Katha Cabal, MD;  Location: [facility];  Service: Vascular;  Laterality: Left;   SHOULDER ARTHROSCOPY WITH SUBACROMIAL DECOMPRESSION AND OPEN ROTATOR C Right 01/08/2022   Procedure: Right shoulder arthroscopic subscapularis repair, rotator cuff repair, subacromial decompression, and distal clavicle excision;  Surgeon: Leim Fabry, MD;  Location: [facility];  Service: Orthopedics;  Laterality: Right;   TEE WITHOUT CARDIOVERSION N/A 05/01/2022   Procedure: TRANSESOPHAGEAL ECHOCARDIOGRAM (TEE);  Surgeon: Minna Merritts, MD;  Location: [facility];  Service: Cardiovascular;  Laterality: N/A;    Family History  Problem Relation Age of Onset   Diabetes Mother     No Known Allergies     Latest Ref Rng & Units 12/06/2022    8:02 AM 04/13/2022    5:54 AM 04/12/2022   11:20 AM  CBC  WBC 4.0 - 10.5 K/uL  3.0  3.2   Hemoglobin 12.0 - 15.0 g/dL 10.2  12.2  11.9   Hematocrit 36.0 - 46.0 % 30.0  38.1  36.2   Platelets 150 - 400 K/uL  134  137       CMP     Component Value Date/Time   NA 144 12/06/2022 0802   NA 139 09/23/2014 1741   K 3.4 (L) 12/06/2022 0802   K 4.2 09/23/2014 1741   CL 106 12/06/2022 0802   CL 112 (H) 09/23/2014 1741   CO2 25 04/13/2022 0554   CO2 21 09/23/2014 1741   GLUCOSE 72 12/06/2022 0802   GLUCOSE 82 09/23/2014 1741   BUN 18 12/06/2022 0802   BUN 18 09/23/2014 1741   CREATININE  7.00 (H) 12/06/2022 0802   CREATININE 1.41 (H) 09/23/2014 1741   CALCIUM 8.7 (L) 04/13/2022 0554   CALCIUM 8.4 (L) 01/03/2019 0243   PROT 8.6 (H) 04/11/2022 1559   PROT 7.9 05/19/2021 0932   PROT 7.5 09/23/2014 1741   ALBUMIN 3.2 (L) 04/12/2022 0930   ALBUMIN 3.9 05/19/2021 0932   ALBUMIN 3.2 (L) 09/23/2014 1741   AST 22 04/11/2022 1559   AST 15 09/23/2014 1741   ALT 16 04/11/2022 1559   ALT 32 09/23/2014 1741   ALKPHOS 203 (H) 04/11/2022 1559   ALKPHOS 65 09/23/2014 1741   BILITOT 1.2 04/11/2022 1559   BILITOT 0.3 05/19/2021 0932   BILITOT 0.2 09/23/2014 1741   GFRNONAA 4 (L) 04/13/2022 0554   GFRNONAA 43 (L) 09/23/2014 1741   GFRNONAA 43 (L) 11/20/2012 1129   GFRAA 3 (L) 08/08/2020 1303   GFRAA 52 (L) 09/23/2014 1741   GFRAA 49 (L) 11/20/2012 1129     No results found.     Assessment & Plan:   1. ESRD (end stage renal disease) ([place]) I suspect that the patient's pain in her hand is related to steal syndrome based on her description of symptoms.  In order to fully evaluate this the patient should undergo an angiogram of the right upper extremity.  We discussed the risk benefits alternatives and she is agreeable to proceed.  The patient also notes that she would like full anesthesia and she is aware that based on this it may take some additional time for coordination. - VAS [place] (AVF, AVG)  2. Benign essential HTN Continue antihypertensive medications as already ordered, these medications have been reviewed and there are no changes at this time.  3. Pain in joint of right shoulder Patient also likely has a component of pain in her upper extremity related to arthritis issues.   Current  Outpatient Medications on File Prior to Visit  Medication Sig Dispense Refill   acetaminophen (TYLENOL) 500 MG tablet Take 2 tablets (1,000 mg total) by mouth every 8 (eight) hours. 90 tablet 2   albuterol (PROVENTIL) (2.5 MG/3ML) 0.083% nebulizer solution Take 2.5 mg  by nebulization every 6 (six) hours as needed for wheezing or shortness of breath.      albuterol (VENTOLIN HFA) 108 (90 Base) MCG/ACT inhaler Inhale 2 puffs into the lungs every 6 (six) hours as needed for wheezing or shortness of breath.      amLODipine (NORVASC) 10 MG tablet Take 10 mg by mouth daily.     apixaban (ELIQUIS) 5 MG TABS tablet Take 1 tablet (5 mg total) by mouth 2 (two) times daily. 60 tablet 11   benzonatate (TESSALON) 200 MG capsule Take 400 mg by mouth 3 (three) times daily as needed for cough.     BREZTRI AEROSPHERE 160-9-4.8 MCG/ACT AERO SMARTSIG:2 Puff(s) By Mouth Twice Daily     CAPLYTA 10.5 MG CAPS Take 1 capsule by mouth daily.     Cholecalciferol 125 MCG (5000 UT) capsule Take by mouth.     cinacalcet (SENSIPAR) 30 MG tablet Take 1 tablet (30 mg total) by mouth every Monday, Wednesday, and Friday. 60 tablet 0   DULERA 200-5 MCG/ACT AERO Inhale 2 puffs into the lungs 2 (two) times daily.  0   Heparin Sodium, Porcine, (HEPARIN LOCK FLUSH IJ) Inject 1 Dose as directed every Monday, Wednesday, and Friday. At dialysis     hydrOXYzine (ATARAX) 10 MG tablet Take 10 mg by mouth 2 (two) times daily as needed.     hydrOXYzine (ATARAX) 25 MG tablet Take by mouth.     lidocaine-prilocaine (EMLA) cream 1 application as needed (prior to port being accessed).     linaclotide (LINZESS) 72 MCG capsule Take 72 mcg by mouth every evening.     lumateperone tosylate (CAPLYTA) 42 MG capsule Take 42 mg by mouth at bedtime.     metoprolol tartrate (LOPRESSOR) 100 MG tablet Take 50 mg by mouth 2 (two) times daily.     multivitamin (RENA-VIT) TABS tablet Take 1 tablet by mouth daily.   1   pantoprazole (PROTONIX) 40 MG tablet Take 1 tablet (40 mg total) by mouth daily. 30 tablet 0   polyethylene glycol powder (GLYCOLAX/MIRALAX) 17 GM/SCOOP powder Take 255 g by mouth daily. 255 g 3   QUEtiapine (SEROQUEL) 100 MG tablet Take 100-200 mg by mouth at bedtime as needed (sleep).     QUEtiapine  (SEROQUEL) 25 MG tablet Take 50 mg by mouth at bedtime as needed (sleep).     torsemide (DEMADEX) 100 MG tablet Take 1 tablet (100 mg total) by mouth daily. 30 tablet 0   No current facility-administered medications on file prior to visit.    There are no Patient Instructions on file for this visit. No follow-ups on file.   Kris Hartmann, NP

## 2022-12-27 ENCOUNTER — Telehealth: Payer: Self-pay | Admitting: Gastroenterology

## 2022-12-27 NOTE — Telephone Encounter (Signed)
Patient called stating that Dr Marius Ditch had sent in a prescription to her pharmacy for some medication for diarrhea. Patient states that the pharmacy says it needs authorization. Patient does not know what the medication is called.

## 2022-12-28 NOTE — Telephone Encounter (Signed)
Prior authorization for Janet Olson has been approved. 14 day treatment  Ref # [id number]  Valid from 12/28/22 - 01/11/23  Pt's family notified.

## 2023-01-10 ENCOUNTER — Telehealth (INDEPENDENT_AMBULATORY_CARE_PROVIDER_SITE_OTHER): Payer: Self-pay

## 2023-01-10 NOTE — Telephone Encounter (Signed)
Spoke with the patient to schedule her for a LUE angio with anesthesia with Dr. Delana Meyer. The patient stated she was being seen in [place] by another vascular surgeon today to have her access worked on. I advised that she have their office fax over information regarding the procedure so we will not schedule her for the same procedure. The fax number was given and the patient stated she would have the surgeon/office fax over information.

## 2023-02-11 ENCOUNTER — Other Ambulatory Visit: Payer: Self-pay

## 2023-02-13 ENCOUNTER — Telehealth: Payer: Self-pay | Admitting: Cardiovascular Disease

## 2023-02-13 NOTE — Telephone Encounter (Signed)
   Pre-operative Risk Assessment    Patient Name: Janet Olson  DOB: 16-Nov-1970 MRN: [id number]     Request for Surgical Clearance    Procedure:  Extractions  Date of Surgery:  Clearance TBD                                 Surgeon:  not listed Surgeon's Group or Practice Name:  [facility] Phone number:  [phone] Fax number:  [phone]   Type of Clearance Requested:  Both, hold Eliquis    Type of Anesthesia:  General    Additional requests/questions:    Signed, Maxwell Caul   02/13/2023, 11:37 AM

## 2023-02-13 NOTE — Telephone Encounter (Signed)
Pharmacy please advise on holding Eliquis prior to dental extractions procedure scheduled for TBD. Thank you.

## 2023-02-14 ENCOUNTER — Ambulatory Visit (INDEPENDENT_AMBULATORY_CARE_PROVIDER_SITE_OTHER): Payer: 59 | Admitting: Gastroenterology

## 2023-02-14 ENCOUNTER — Telehealth: Payer: Self-pay

## 2023-02-14 ENCOUNTER — Encounter: Payer: Self-pay | Admitting: Gastroenterology

## 2023-02-14 ENCOUNTER — Other Ambulatory Visit: Payer: Self-pay

## 2023-02-14 VITALS — BP 127/74 | HR 86 | Temp 98.3°F | Ht 66.0 in | Wt 189.1 lb

## 2023-02-14 DIAGNOSIS — F17200 Nicotine dependence, unspecified, uncomplicated: Secondary | ICD-10-CM | POA: Insufficient documentation

## 2023-02-14 DIAGNOSIS — K641 Second degree hemorrhoids: Secondary | ICD-10-CM

## 2023-02-14 DIAGNOSIS — I10 Essential (primary) hypertension: Secondary | ICD-10-CM | POA: Insufficient documentation

## 2023-02-14 DIAGNOSIS — K529 Noninfective gastroenteritis and colitis, unspecified: Secondary | ICD-10-CM

## 2023-02-14 DIAGNOSIS — K625 Hemorrhage of anus and rectum: Secondary | ICD-10-CM

## 2023-02-14 DIAGNOSIS — E785 Hyperlipidemia, unspecified: Secondary | ICD-10-CM | POA: Insufficient documentation

## 2023-02-14 NOTE — Telephone Encounter (Signed)
   Name: Janet Olson  DOB: [birth year]  MRN: [id number]  Primary Cardiologist: Ida Rogue, MD   Preoperative team, please contact this patient and set up a phone call appointment for further preoperative risk assessment. Please obtain consent and complete medication review. Thank you for your help.  I confirm that guidance regarding antiplatelet and oral anticoagulation therapy has been completed and, if necessary, noted below.  Patient does not require SBE prophylaxis.   Per office protocol, patient can hold Eliquis for 2 days prior to procedure.        Tami Lin Shonika Kolasinski, PA 02/14/2023, 11:59 AM [place]

## 2023-02-14 NOTE — Telephone Encounter (Signed)
Printed Medicaid card but [place] health care card is not scan so unable to fax card to them

## 2023-02-14 NOTE — Telephone Encounter (Signed)
Faxed referral to [facility] at [phone]. Also email Salomon Mast to let her the referral was faxed. Attached to the fax last office visit note, insurance, Demographics, colonoscopy and EGD reports , and path report

## 2023-02-14 NOTE — Progress Notes (Signed)
Cephas Darby, MD [address]  [place]  [place], [place] [address]  Main: [phone]  Fax: [phone]    Gastroenterology Consultation  Referring Provider:     Remi Haggard, FNP Primary Care Physician:  Remi Haggard, FNP Primary Gastroenterologist:  Dr. Cephas Darby Reason for Consultation:   Symptomatic hemorrhoids, chronic diarrhea    HPI:   Janet Olson is a 53 y.o. female referred by Dr. Dewitt Hoes for consultation & management of new onset of diarrhea that has started approximately 2 and half months ago.  Patient used to suffer from severe constipation, was taking Linzess.  She has end-stage renal disease on hemodialysis.  History of grade 2 hemorrhoids, underwent hemorrhoid ligation x4.  Patient reports that she has been experiencing nonbloody diarrheal episodes up to 8 times daily for last 2 and half months associated with vague abdominal discomfort, predominant in the periumbilical and left lower quadrant areas.  She wakes up around 2:45 AM to have loose bowel movement, followed by several episodes throughout the day, worse after eating.  She has noticed rectal bleeding lately, bright red blood per rectum in the toilet bowl as well as on the toilet paper.  Her hemoglobin has remained stable.  Patient is worried and it is impairing her quality of life, particularly with several bowel movements.  She describes her stools on distal stool scale as 5.  She denies any changes in her diet.  She denies any epigastric pain, nausea or vomiting.  She denies any changes in her medications.  Follow-up visit 02/14/2023 Patient is here for follow-up of chronic diarrhea.  H. pylori stool antigen, GI profile PCR negative.  Pancreatic fecal elastase levels are normal.  She underwent EGD and colonoscopy which were unremarkable.  No evidence of IBD, celiac disease or microscopic colitis.  She continues to experience intermittent episodes of bright red blood per rectum.   She also thinks her diarrhea is secondary to the type of food or drinks she consumes.  For last 2 weeks, she is not experiencing rectal bleeding or diarrhea.  She does not have any other concerns today.  NSAIDs: none  Antiplts/Anticoagulants/Anti thrombotics: Eliquis for history of A-fib  GI Procedures:  EGD and colonoscopy 12/06/2022 DIAGNOSIS: A.  DUODENUM; BIOPSIES: - MODERATE BRUNNER'S GLAND HYPERPLASIA WITH ASSOCIATED TYPICAL ARCHITECTURAL CHANGES. - NO EVIDENCE OF SIGNIFICANTLY INCREASED SURFACE LYMPHOCYTES.  B.  STOMACH, ANTRUM; BIOPSIES: - MILD REACTIVE GASTROPATHY. - NO SIGNIFICANT INTESTINAL METAPLASIA, DYSPLASIA, GLANDULAR ATROPHY OR EVIDENCE OF HELICOBACTER PYLORI.  C.  STOMACH, BODY; BIOPSIES: - GASTRIC BODY TYPE MUCOSA WITH NO SPECIFIC HISTOLOGIC ABNORMALITY.  D.  TERMINAL ILEUM; BIOPSIES: - SMALL BOWEL MUCOSA WITH NO SPECIFIC HISTOLOGIC ABNORMALITY.  E.  COLON, RANDOM; BIOPSIES: - COLONIC MUCOSA WITH NO SPECIFIC HISTOLOGIC ABNORMALITY.  F.  COLON, ASCENDING, POLYP; BIOPSY: - COLONIC MUCOSA WITH MILD NONSPECIFIC HYPERPLASIA. - FEATURES OF A SPECIFIC POLYP ARE NOT SEEN. - NO ATYPIA.   Colonoscopy for rectal bleeding, 04/24/2018 - The examined portion of the ileum was normal. - Three diminutive polyps in the rectum and in the cecum, removed with a cold biopsy forceps. Resected and retrieved. - Moderate diverticulosis in the sigmoid colon. There was no evidence of diverticular bleeding. - Internal hemorrhoids. DIAGNOSIS:  A.  COLON POLYP, CECUM; COLD BIOPSY:  - COLONIC MUCOSA WITH PROMINENT LYMPHOID AGGREGATE.  - NEGATIVE FOR DYSPLASIA AND MALIGNANCY.   B.  RECTUM POLYP; COLD BIOPSY:  - EARLY HYPERPLASTIC POLYP.  - NEGATIVE FOR DYSPLASIA  AND MALIGNANCY.   She denies family history of GI malignancy in first-degree or second-degree relatives She did not have any GI surgeries  Past Medical History:  Diagnosis Date   (HFpEF) heart failure with preserved  ejection fraction ([place])    a. 2015 Echo: Nl EF. Mild to mod LVH; b. 04/2017 Echo: EF 55-60% Gr1 DD; c. 12/2018 Echo: EF 55-60%; d. 02/2020 Echo: EF 50-55%; e. 10/2020 Echo: EF 55-60%, mild LVH, mildly enlarged RV, sev elev RVSP, BAE, mild MR; 12/2020 Echo: EF 65%, sev dil RA, dil LA, RVSP 29.62mHg, MR/PR.   Abdominal ascites    Acute hypoxemic respiratory failure ([facility] 01/03/2019   Acute renal failure (ARF) ([facility] 05/17/2017   Acute respiratory failure ([facility] 09/25/2018   Anemia    Anxiety    Arthritis    Asthma    Atrial flutter ([facility]    a. Dx 03/2022-->CHA2DS2VASc = 3-->eliquis.   Cardiomegaly    COPD (chronic obstructive pulmonary disease) (HCC)    Depression    [facility] [place]    [facility], W[facility] , FRI   Dyspnea    ESRD (end stage renal disease) (HCC)    GERD (gastroesophageal reflux disease)    Headache    Migraines   Hypertension    Nonrheumatic tricuspid (valve) insufficiency    Pericardial effusion 01/26/2019   Respiratory failure with hypoxia ([facility] 04/21/2018   Ulcer    gastric   Vitamin D deficiency     Past Surgical History:  Procedure Laterality Date   A/V FISTULAGRAM Left 04/17/2018   Procedure: A/V FISTULAGRAM;  Surgeon: DAlgernon Huxley MD;  [facility];  Service: Cardiovascular;  Laterality: Left;   A/V FISTULAGRAM Left 05/27/2018   Procedure: A/V FISTULAGRAM;  Surgeon: SKatha Cabal MD;  [facility];  Service: Cardiovascular;  Laterality: Left;   A/V FISTULAGRAM Left 07/05/2020   Procedure: A/V FISTULAGRAM;  Surgeon: SKatha Cabal MD;  [facility];  Service: Cardiovascular;  Laterality: Left;   A/V FISTULAGRAM Left 06/26/2022   Procedure: A/V Fistulagram;  Surgeon: SKatha Cabal MD;  [facility];  Service: Cardiovascular;  Laterality: Left;   AV FISTULA PLACEMENT Left 08/08/2017   Procedure: ARTERIOVENOUS (AV) FISTULA CREATION ( BRACHIOCEPHALIC );  Surgeon: DAlgernon Huxley MD;   Location: [facility];  Service: Vascular;  Laterality: Left;   AV FISTULA PLACEMENT Left 08/05/2020   Procedure: INSERTION OF ARTERIOVENOUS (AV) GRAFT ARM(BRACHIAL AXILLARY);  Surgeon: SKatha Cabal MD;  Location: [facility];  Service: Vascular;  Laterality: Left;   CARDIOVERSION N/A 05/01/2022   Procedure: CARDIOVERSION;  Surgeon: GMinna Merritts MD;  Location: [facility];  Service: Cardiovascular;  Laterality: N/A;   CESAREAN SECTION     x 4 (1988, 1991, 1997, 2000 )   COLONOSCOPY WITH PROPOFOL N/A 04/24/2018   Procedure: COLONOSCOPY WITH PROPOFOL;  Surgeon: VLin Landsman MD;  [facility];  Service: Gastroenterology;  Laterality: N/A;   COLONOSCOPY WITH PROPOFOL N/A 12/06/2022   Procedure: COLONOSCOPY WITH PROPOFOL;  Surgeon: VLin Landsman MD;  [facility];  Service: Gastroenterology;  Laterality: N/A;   DIALYSIS/PERMA CATHETER INSERTION N/A 07/16/2017   Procedure: Dialysis/Perma Catheter Insertion;  Surgeon: SKatha Cabal MD;  [facility];  Service: Cardiovascular;  Laterality: N/A;   DIALYSIS/PERMA CATHETER REMOVAL N/A 10/29/2017   Procedure: DIALYSIS/PERMA CATHETER REMOVAL;  Surgeon: SKatha Cabal MD;  [facility];  Service: Cardiovascular;  Laterality:  N/A;   DIALYSIS/PERMA CATHETER REMOVAL N/A 11/03/2020   Procedure: DIALYSIS/PERMA CATHETER REMOVAL;  Surgeon: Algernon Huxley, MD;  Location: [place] [facility];  Service: Cardiovascular;  Laterality: N/A;   ESOPHAGOGASTRODUODENOSCOPY (EGD) WITH PROPOFOL N/A 12/06/2022   Procedure: ESOPHAGOGASTRODUODENOSCOPY (EGD) WITH PROPOFOL;  Surgeon: Lin Landsman, MD;  Location[facility]COPY;  Service: Gastroenterology;  Laterality: N/A;   excision of lymph nodes Bilateral 2014   bilateral under arms.   PARATHYROID IMPLANT REMOVAL     REMOVAL OF A DIALYSIS CATHETER Left 08/05/2020   Procedure: REMOVAL OF A DIALYSIS CATHETER ( FISTULA );  Surgeon: Katha Cabal, MD;  Location: [facility];  Service: Vascular;  Laterality: Left;   SHOULDER ARTHROSCOPY WITH SUBACROMIAL DECOMPRESSION AND OPEN ROTATOR C Right 01/08/2022   Procedure: Right shoulder arthroscopic subscapularis repair, rotator cuff repair, subacromial decompression, and distal clavicle excision;  Surgeon: Leim Fabry, MD;  Location: [facility];  Service: Orthopedics;  Laterality: Right;   TEE WITHOUT CARDIOVERSION N/A 05/01/2022   Procedure: TRANSESOPHAGEAL ECHOCARDIOGRAM (TEE);  Surgeon: Minna Merritts, MD;  Location: [facility];  Service: Cardiovascular;  Laterality: N/A;     Current Outpatient Medications:    albuterol (PROVENTIL) (2.5 MG/3ML) 0.083% nebulizer solution, Take 2.5 mg by nebulization every 6 (six) hours as needed for wheezing or shortness of breath. , Disp: , Rfl:    albuterol (VENTOLIN HFA) 108 (90 Base) MCG/ACT inhaler, Inhale 2 puffs into the lungs every 6 (six) hours as needed for wheezing or shortness of breath. , Disp: , Rfl:    amLODipine (NORVASC) 10 MG tablet, Take 10 mg by mouth daily., Disp: , Rfl:    apixaban (ELIQUIS) 5 MG TABS tablet, Take 1 tablet (5 mg total) by mouth 2 (two) times daily., Disp: 60 tablet, Rfl: 11   AURYXIA 1 GM 210 MG(Fe) tablet, Take 420 mg by mouth 3 (three) times daily., Disp: , Rfl:    benzonatate (TESSALON) 200 MG capsule, Take 400 mg by mouth 3 (three) times daily as needed for cough., Disp: , Rfl:    BREZTRI AEROSPHERE 160-9-4.8 MCG/ACT AERO, SMARTSIG:2 Puff(s) By Mouth Twice Daily, Disp: , Rfl:    CAPLYTA 10.5 MG CAPS, Take 1 capsule by mouth daily., Disp: , Rfl:    Cholecalciferol 125 MCG (5000 UT) capsule, Take by mouth., Disp: , Rfl:    cinacalcet (SENSIPAR) 30 MG tablet, Take 1 tablet (30 mg total) by mouth every Monday, Wednesday, and Friday., Disp: 60 tablet, Rfl: 0   DULERA 200-5 MCG/ACT AERO, Inhale 2 puffs into the lungs 2 (two) times daily., Disp: , Rfl: 0   DULoxetine (CYMBALTA) 60 MG capsule, Take by mouth., Disp: , Rfl:     furosemide (LASIX) 40 MG tablet, Take 40 mg by mouth daily., Disp: , Rfl:    Heparin Sodium, Porcine, (HEPARIN LOCK FLUSH IJ), Inject 1 Dose as directed every Monday, Wednesday, and Friday. At dialysis, Disp: , Rfl:    hydrOXYzine (ATARAX) 10 MG tablet, Take 10 mg by mouth 2 (two) times daily as needed., Disp: , Rfl:    hydrOXYzine (ATARAX) 25 MG tablet, Take by mouth., Disp: , Rfl:    lanthanum (FOSRENOL) 1000 MG chewable tablet, Chew 1,000 mg by mouth 3 (three) times daily., Disp: , Rfl:    lidocaine-prilocaine (EMLA) cream, 1 application as needed (prior to port being accessed)., Disp: , Rfl:    linaclotide (LINZESS) 72 MCG capsule, Take 72 mcg by mouth every evening., Disp: , Rfl:    lumateperone tosylate (  CAPLYTA) 42 MG capsule, Take 42 mg by mouth at bedtime., Disp: , Rfl:    Metoprolol Succinate (KAPSPARGO SPRINKLE) 100 MG CS24, Take by mouth., Disp: , Rfl:    metoprolol tartrate (LOPRESSOR) 100 MG tablet, Take 50 mg by mouth 2 (two) times daily., Disp: , Rfl:    multivitamin (RENA-VIT) TABS tablet, Take 1 tablet by mouth daily. , Disp: , Rfl: 1   pantoprazole (PROTONIX) 40 MG tablet, Take 1 tablet (40 mg total) by mouth daily., Disp: 30 tablet, Rfl: 0   polyethylene glycol powder (GLYCOLAX/MIRALAX) 17 GM/SCOOP powder, Take 255 g by mouth daily., Disp: 255 g, Rfl: 3   QUEtiapine (SEROQUEL) 100 MG tablet, Take 100-200 mg by mouth at bedtime as needed (sleep)., Disp: , Rfl:    QUEtiapine (SEROQUEL) 25 MG tablet, Take 50 mg by mouth at bedtime as needed (sleep)., Disp: , Rfl:    sodium chloride (OCEAN) 0.65 % SOLN nasal spray, Place 1 spray into both nostrils as needed for congestion., Disp: 60 mL, Rfl: 0   torsemide (DEMADEX) 100 MG tablet, Take 1 tablet (100 mg total) by mouth daily., Disp: 30 tablet, Rfl: 0   VELPHORO 500 MG chewable tablet, Chew 1,000 mg by mouth 3 (three) times daily., Disp: , Rfl:    Vitamin D, Ergocalciferol, (DRISDOL) 1.25 MG (50000 UNIT) CAPS capsule, Take 50,000 Units  by mouth once a week., Disp: , Rfl:    Family History  Problem Relation Age of Onset   Diabetes Mother      Social History   Tobacco Use   Smoking status: Every Day    Packs/day: 0.50    Years: 30.00    Total pack years: 15.00    Types: Cigarettes   Smokeless tobacco: Never  Vaping Use   Vaping Use: Never used  Substance Use Topics   Alcohol use: No   Drug use: No    Allergies as of 02/14/2023   (No Known Allergies)    Review of Systems:    All systems reviewed and negative except where noted in HPI.   Physical Exam:  BP 127/74 (BP Location: Left Arm, Patient Position: Sitting, Cuff Size: Normal)   Pulse 86   Temp 98.3 F (36.8 C) (Oral)   Ht 5' 6"$  (1.676 m)   Wt 189 lb 2 oz (85.8 kg)   LMP 03/03/2018 (Approximate)   BMI 30.53 kg/m  Patient's last menstrual period was 03/03/2018 (approximate).  General:   Alert,  Well-developed, well-nourished, pleasant and cooperative in NAD Head:  Normocephalic and atraumatic. Eyes:  Sclera clear, no icterus.   Conjunctiva pink. Ears:  Normal auditory acuity. Nose:  No deformity, discharge, or lesions. Mouth:  No deformity or lesions,oropharynx pink & moist. Neck:  Supple; no masses or thyromegaly. Lungs:  Respirations even and unlabored.  Clear throughout to auscultation.   No wheezes, crackles, or rhonchi. No acute distress. Heart:  Regular rate and rhythm; no murmurs, clicks, rubs, or gallops. Abdomen:  Normal bowel sounds. Soft, nondistended, with nontender umbilical hernia present, non-tender without masses, hepatosplenomegaly noted.  No guarding or rebound tenderness.   Rectal: Not performed Msk:  Symmetrical without gross deformities. Good, equal movement & strength bilaterally. Pulses:  Normal pulses noted. Extremities:  No clubbing, no edema.  No cyanosis, left upper arm AV fistula. Neurologic:  Alert and oriented x3;  grossly normal neurologically. Skin:  Intact without significant lesions or rashes. No  jaundice. Psych:  Alert and cooperative. Normal mood and affect.  Imaging Studies: reviewed  Assessment and Plan:   Janet Olson is a 53 y.o. African-American female with end stage renal disease secondary to hypertension on hemodialysis, ascites secondary to ESRD and CHF, A-fib on Eliquis, history of symptomatic hemorrhoids s/p hemorrhoid ligation x4, is seen in consultation for rectal bleeding and chronic diarrhea  Chronic diarrhea: GI profile PCR, H. pylori stool antigen negative upper endoscopy and colonoscopy are unremarkable Pancreatic fecal elastase levels came back normal Check food allergy profile and alpha gal panel Bright red blood per rectum Likely secondary to hemorrhoids, defer hemorrhoid ligation until work-up of diarrhea is completed given that her hemoglobin has been stable Given that she underwent hemorrhoid ligation in the past, discussed with her regarding vascular embolization versus surgical hemorrhoidectomy.  Patient is willing to undergo vascular embolization, will place referral to [facility] vascular associates  Follow up as needed   Cephas Darby, MD

## 2023-02-14 NOTE — Telephone Encounter (Signed)
I s/w the dental office and confirmed how many teeth for extraction;   ADDENDUM TO CLEARANCE: 4 TEETH FOR SURGICAL EXTRACTIONS   DR. Curly Rim, DMD/DR. Lu Duffel, DDS

## 2023-02-14 NOTE — Telephone Encounter (Signed)
Need to clarify how many teeth are being extracted before clearance can be given.

## 2023-02-14 NOTE — Telephone Encounter (Signed)
Patient with diagnosis of aflutter on Eliquis for anticoagulation.    Procedure: 4 surgical extractions Date of procedure: TBD  CHA2DS2-VASc Score = 5  This indicates a 7.2% annual risk of stroke. The patient's score is based upon: CHF History: 1 HTN History: 1 Diabetes History: 1 Stroke History: 0 Vascular Disease History: 1 Age Score: 0 Gender Score: 1   CrCl - ESRD on dialysis Platelet count 134K  Patient does not require SBE prophylaxis.  Per office protocol, patient can hold Eliquis for 2 days prior to procedure.    **This guidance is not considered finalized until pre-operative APP has relayed final recommendations.**

## 2023-02-15 ENCOUNTER — Telehealth: Payer: Self-pay | Admitting: *Deleted

## 2023-02-15 NOTE — Telephone Encounter (Signed)
Pt has been schedule tele pre op appt 02/20/23 @ 3 pm. Med rec and consent are done.   Pt tells me that he BP has been low at dialysis. She is going to have dialysis fax over BP readings to cardiologist.     Patient Consent for Virtual Visit        Janet Olson has provided verbal consent on 02/15/2023 for a virtual visit (video or telephone).   CONSENT FOR VIRTUAL VISIT FOR:  Janet Olson  By participating in this virtual visit I agree to the following:  I hereby voluntarily request, consent and authorize [facility] and its employed or contracted physicians, physician assistants, nurse practitioners or other licensed health care professionals (the Practitioner), to provide me with telemedicine health care services (the "Services") as deemed necessary by the treating Practitioner. I acknowledge and consent to receive the Services by the Practitioner via telemedicine. I understand that the telemedicine visit will involve communicating with the Practitioner through live audiovisual communication technology and the disclosure of certain medical information by electronic transmission. I acknowledge that I have been given the opportunity to request an in-person assessment or other available alternative prior to the telemedicine visit and am voluntarily participating in the telemedicine visit.  I understand that I have the right to withhold or withdraw my consent to the use of telemedicine in the course of my care at any time, without affecting my right to future care or treatment, and that the Practitioner or I may terminate the telemedicine visit at any time. I understand that I have the right to inspect all information obtained and/or recorded in the course of the telemedicine visit and may receive copies of available information for a reasonable fee.  I understand that some of the potential risks of receiving the Services via telemedicine include:  Delay or interruption in medical  evaluation due to technological equipment failure or disruption; Information transmitted may not be sufficient (e.g. poor resolution of images) to allow for appropriate medical decision making by the Practitioner; and/or  In rare instances, security protocols could fail, causing a breach of personal health information.  Furthermore, I acknowledge that it is my responsibility to provide information about my medical history, conditions and care that is complete and accurate to the best of my ability. I acknowledge that Practitioner's advice, recommendations, and/or decision may be based on factors not within their control, such as incomplete or inaccurate data provided by me or distortions of diagnostic images or specimens that may result from electronic transmissions. I understand that the practice of medicine is not an exact science and that Practitioner makes no warranties or guarantees regarding treatment outcomes. I acknowledge that a copy of this consent can be made available to me via my patient portal ([place]), or I can request a printed copy by calling the office of [facility].    I understand that my insurance will be billed for this visit.   I have read or had this consent read to me. I understand the contents of this consent, which adequately explains the benefits and risks of the Services being provided via telemedicine.  I have been provided ample opportunity to ask questions regarding this consent and the Services and have had my questions answered to my satisfaction. I give my informed consent for the services to be provided through the use of telemedicine in my medical care

## 2023-02-15 NOTE — Telephone Encounter (Signed)
Pt has been schedule tele pre op appt 02/20/23 @ 3 pm. Med rec and consent are done.    Pt tells me that he BP has been low at dialysis. She is going to have dialysis fax over BP readings to cardiologist.

## 2023-02-18 ENCOUNTER — Telehealth: Payer: Self-pay | Admitting: Cardiovascular Disease

## 2023-02-18 ENCOUNTER — Telehealth: Payer: Self-pay

## 2023-02-18 LAB — FOOD ALLERGY PROFILE
Allergen Corn, IgE: <0.1 kU/L
Clam IgE: <0.1 kU/L
Codfish IgE: <0.1 kU/L
Egg White IgE: <0.1 kU/L
Milk IgE: <0.1 kU/L
Peanut IgE: <0.1 kU/L
Scallop IgE: <0.1 kU/L
Sesame Seed IgE: <0.1 kU/L
Shrimp IgE: <0.1 kU/L
Soybean IgE: <0.1 kU/L
Walnut IgE: <0.1 kU/L
Wheat IgE: <0.1 kU/L

## 2023-02-18 LAB — ALPHA-GAL PANEL
Allergen Lamb IgE: <0.1 kU/L
Beef IgE: <0.1 kU/L
IgE (Immunoglobulin E), Serum: 15 IU/mL (ref 6–495)
O215-IgE Alpha-Gal: <0.1 kU/L
Pork IgE: <0.1 kU/L

## 2023-02-18 NOTE — Telephone Encounter (Signed)
Called and left a message for call back  

## 2023-02-18 NOTE — Telephone Encounter (Signed)
Scheduled for Hemorrhoid Embolization consult on 03/05/23 at 11:30am!

## 2023-02-18 NOTE — Telephone Encounter (Signed)
BP and weight readings placed in Dr. Donivan Scull folder for review.

## 2023-02-18 NOTE — Telephone Encounter (Signed)
Received blood pressure readings from [facility] in DR. [place] box

## 2023-02-18 NOTE — Telephone Encounter (Signed)
-----   Message from Lin Landsman, MD sent at 02/18/2023  7:46 AM EST ----- Blood work came back normal for any food intolerances  RV

## 2023-02-19 NOTE — Telephone Encounter (Signed)
Called and left a message for call back  

## 2023-02-19 NOTE — Telephone Encounter (Signed)
Patient verbalized understanding of results  

## 2023-02-20 ENCOUNTER — Ambulatory Visit: Payer: 59 | Attending: Internal Medicine | Admitting: Student

## 2023-02-20 ENCOUNTER — Ambulatory Visit: Payer: 59 | Admitting: Gastroenterology

## 2023-02-20 DIAGNOSIS — Z01810 Encounter for preprocedural cardiovascular examination: Secondary | ICD-10-CM

## 2023-02-20 NOTE — Progress Notes (Signed)
Virtual Visit via Telephone Note   Because of Janet Olson's co-morbid illnesses, she is at least at moderate risk for complications without adequate follow up.  This format is felt to be most appropriate for this patient at this time.  The patient did not have access to video technology/had technical difficulties with video requiring transitioning to audio format only (telephone).  All issues noted in this document were discussed and addressed.  No physical exam could be performed with this format.  Please refer to the patient's chart for her consent to telehealth for [facility].  Evaluation Performed:  Preoperative cardiovascular risk assessment _____________   Date:  02/20/2023   Patient ID:  Janet Olson, DOB 08-May-1970, MRN [id number] Patient Location:  Home Provider location:   Office  Primary Care Provider:  Remi Haggard, FNP Primary Cardiologist:  Ida Rogue, MD  Chief Complaint / Patient Profile   53 y.o. y/o female with a h/o PAF on anticoagulation, HFpEF, ESRD on HD, hypertension, GERD, COPD, anemia, tobacco abuse who is pending extraction of 4 teeth and presents today for telephonic preoperative cardiovascular risk assessment.  History of Present Illness    Janet Olson is a 53 y.o. female who presents via audio/video conferencing for a telehealth visit today.  Pt was last seen in cardiology clinic on 11/26/2022 by Dr. Rockey Situ.  At that time Janet Olson was doing well from a cardiac perspective.  The patient is now pending procedure as outlined above. Since her last visit, she has been doing well from a cardiac standpoint. Patient denies shortness of breath or dyspnea on exertion. No chest pain, pressure, or tightness. Denies lower extremity edema, orthopnea, or PND. No palpitations. She does not do any formal exercising but she is able to perform all hygiene activities, grocery shopping, carrying groceries in the house, and moderate household  chores without chest pain or dyspnea.   Past Medical History    Past Medical History:  Diagnosis Date   (HFpEF) heart failure with preserved ejection fraction ([place])    a. 2015 Echo: Nl EF. Mild to mod LVH; b. 04/2017 Echo: EF 55-60% Gr1 DD; c. 12/2018 Echo: EF 55-60%; d. 02/2020 Echo: EF 50-55%; e. 10/2020 Echo: EF 55-60%, mild LVH, mildly enlarged RV, sev elev RVSP, BAE, mild MR; 12/2020 Echo: EF 65%, sev dil RA, dil LA, RVSP 29.15mHg, MR/PR.   Abdominal ascites    Acute hypoxemic respiratory failure ([facility] 01/03/2019   Acute renal failure (ARF) ([place] 05/17/2017   Acute respiratory failure ([facility] 09/25/2018   Anemia    Anxiety    Arthritis    Asthma    Atrial flutter ([facility]    a. Dx 03/2022-->CHA2DS2VASc = 3-->eliquis.   Cardiomegaly    COPD (chronic obstructive pulmonary disease) (HCC)    Depression    Dialysis patient ([facility]    MON, WED , FRI   Dyspnea    ESRD (end stage renal disease) (HCC)    GERD (gastroesophageal reflux disease)    Headache    Migraines   Hypertension    Nonrheumatic tricuspid (valve) insufficiency    Pericardial effusion 01/26/2019   Respiratory failure with hypoxia ([facility] 04/21/2018   Ulcer    gastric   Vitamin D deficiency    Past Surgical History:  Procedure Laterality Date   A/V FISTULAGRAM Left 04/17/2018   Procedure: A/V FISTULAGRAM;  Surgeon: DAlgernon Huxley MD;  [facility];  Service: Cardiovascular;  Laterality: Left;   A/V  FISTULAGRAM Left 05/27/2018   Procedure: A/V FISTULAGRAM;  Surgeon: Katha Cabal, MD;  Location: [place] [facility];  Service: Cardiovascular;  Laterality: Left;   A/V FISTULAGRAM Left 07/05/2020   Procedure: A/V FISTULAGRAM;  Surgeon: Katha Cabal, MD;  Location: [place] [facility];  Service: Cardiovascular;  Laterality: Left;   A/V FISTULAGRAM Left 06/26/2022   Procedure: A/V Fistulagram;  Surgeon: Katha Cabal, MD;  Location: [place] [facility];  Service: Cardiovascular;  Laterality: Left;    AV FISTULA PLACEMENT Left 08/08/2017   Procedure: ARTERIOVENOUS (AV) FISTULA CREATION ( BRACHIOCEPHALIC );  Surgeon: Algernon Huxley, MD;  Location: [facility];  Service: Vascular;  Laterality: Left;   AV FISTULA PLACEMENT Left 08/05/2020   Procedure: INSERTION OF ARTERIOVENOUS (AV) GRAFT ARM(BRACHIAL AXILLARY);  Surgeon: Katha Cabal, MD;  Location: [facility];  Service: Vascular;  Laterality: Left;   CARDIOVERSION N/A 05/01/2022   Procedure: CARDIOVERSION;  Surgeon: Minna Merritts, MD;  Location: [facility];  Service: Cardiovascular;  Laterality: N/A;   CESAREAN SECTION     x 4 (1988, 1991, 1997, 2000 )   COLONOSCOPY WITH PROPOFOL N/A 04/24/2018   Procedure: COLONOSCOPY WITH PROPOFOL;  Surgeon: Lin Landsman, MD;  Location: [facility] ENDOSCOPY;  Service: Gastroenterology;  Laterality: N/A;   COLONOSCOPY WITH PROPOFOL N/A 12/06/2022   Procedure: COLONOSCOPY WITH PROPOFOL;  Surgeon: Lin Landsman, MD;  Location: [facility] ENDOSCOPY;  Service: Gastroenterology;  Laterality: N/A;   DIALYSIS/PERMA CATHETER INSERTION N/A 07/16/2017   Procedure: Dialysis/Perma Catheter Insertion;  Surgeon: Katha Cabal, MD;  Location: [place] [facility];  Service: Cardiovascular;  Laterality: N/A;   DIALYSIS/PERMA CATHETER REMOVAL N/A 10/29/2017   Procedure: DIALYSIS/PERMA CATHETER REMOVAL;  Surgeon: Katha Cabal, MD;  Location: [place] [facility];  Service: Cardiovascular;  Laterality: N/A;   DIALYSIS/PERMA CATHETER REMOVAL N/A 11/03/2020   Procedure: DIALYSIS/PERMA CATHETER REMOVAL;  Surgeon: Algernon Huxley, MD;  Location: [place] [facility];  Service: Cardiovascular;  Laterality: N/A;   ESOPHAGOGASTRODUODENOSCOPY (EGD) WITH PROPOFOL N/A 12/06/2022   Procedure: ESOPHAGOGASTRODUODENOSCOPY (EGD) WITH PROPOFOL;  Surgeon: Lin Landsman, MD;  Location: [facility] ENDOSCOPY;  Service: Gastroenterology;  Laterality: N/A;   excision of lymph nodes Bilateral 2014   bilateral under arms.   PARATHYROID IMPLANT  REMOVAL     REMOVAL OF A DIALYSIS CATHETER Left 08/05/2020   Procedure: REMOVAL OF A DIALYSIS CATHETER ( FISTULA );  Surgeon: Katha Cabal, MD;  Location: [facility];  Service: Vascular;  Laterality: Left;   SHOULDER ARTHROSCOPY WITH SUBACROMIAL DECOMPRESSION AND OPEN ROTATOR C Right 01/08/2022   Procedure: Right shoulder arthroscopic subscapularis repair, rotator cuff repair, subacromial decompression, and distal clavicle excision;  Surgeon: Leim Fabry, MD;  Location: [facility];  Service: Orthopedics;  Laterality: Right;   TEE WITHOUT CARDIOVERSION N/A 05/01/2022   Procedure: TRANSESOPHAGEAL ECHOCARDIOGRAM (TEE);  Surgeon: Minna Merritts, MD;  Location: [facility];  Service: Cardiovascular;  Laterality: N/A;    Allergies  No Known Allergies  Home Medications    Prior to Admission medications   Medication Sig Start Date End Date Taking? Authorizing Provider  albuterol (PROVENTIL) (2.5 MG/3ML) 0.083% nebulizer solution Take 2.5 mg by nebulization every 6 (six) hours as needed for wheezing or shortness of breath.     [provider]  albuterol (VENTOLIN HFA) 108 (90 Base) MCG/ACT inhaler Inhale 2 puffs into the lungs every 6 (six) hours as needed for wheezing or shortness of breath.  12/22/19   [provider]  amLODipine (NORVASC) 10 MG tablet Take 10 mg by mouth daily. 05/29/22   [provider]  apixaban (ELIQUIS) 5 MG TABS tablet Take 1 tablet (5 mg total) by mouth 2 (two) times daily. 04/27/22   Theora Gianotti, NP  AURYXIA 1 GM 210 MG(Fe) tablet Take 420 mg by mouth 3 (three) times daily. 09/21/22   [provider]  benzonatate (TESSALON) 200 MG capsule Take 400 mg by mouth 3 (three) times daily as needed for cough.    [provider]  BREZTRI AEROSPHERE 160-9-4.8 MCG/ACT AERO SMARTSIG:2 Puff(s) By Mouth Twice Daily 10/29/22   [provider]  CAPLYTA 10.5 MG CAPS Take 1 capsule by mouth daily. 09/06/22   [provider]   Cholecalciferol 125 MCG (5000 UT) capsule Take by mouth. 03/07/22   [provider]  cinacalcet (SENSIPAR) 30 MG tablet Take 1 tablet (30 mg total) by mouth every Monday, Wednesday, and Friday. 04/13/22   Shawna Clamp, MD  DULERA 200-5 MCG/ACT AERO Inhale 2 puffs into the lungs 2 (two) times daily. 01/16/18   [provider]  DULoxetine (CYMBALTA) 60 MG capsule Take by mouth.    [provider]  furosemide (LASIX) 40 MG tablet Take 40 mg by mouth daily. 09/28/22   [provider]  Heparin Sodium, Porcine, (HEPARIN LOCK FLUSH IJ) Inject 1 Dose as directed every Monday, Wednesday, and Friday. At dialysis    [provider]  hydrOXYzine (ATARAX) 10 MG tablet Take 10 mg by mouth 2 (two) times daily as needed. 11/09/22   [provider]  hydrOXYzine (ATARAX) 25 MG tablet Take by mouth. 04/23/22   [provider]  lanthanum (FOSRENOL) 1000 MG chewable tablet Chew 1,000 mg by mouth 3 (three) times daily. 12/03/22   [provider]  lidocaine-prilocaine (EMLA) cream 1 application as needed (prior to port being accessed). 12/15/20   [provider]  linaclotide (LINZESS) 72 MCG capsule Take 72 mcg by mouth every evening.    [provider]  lumateperone tosylate (CAPLYTA) 42 MG capsule Take 42 mg by mouth at bedtime.    [provider]  Metoprolol Succinate (KAPSPARGO SPRINKLE) 100 MG CS24 Take by mouth.    [provider]  metoprolol tartrate (LOPRESSOR) 100 MG tablet Take 50 mg by mouth 2 (two) times daily.    [provider]  multivitamin (RENA-VIT) TABS tablet Take 1 tablet by mouth daily.  08/23/17   [provider]  pantoprazole (PROTONIX) 40 MG tablet Take 1 tablet (40 mg total) by mouth daily. 12/20/18   Schuyler Amor, MD  polyethylene glycol powder (GLYCOLAX/MIRALAX) 17 GM/SCOOP powder Take 255 g by mouth daily. 06/05/22   Lin Landsman, MD  QUEtiapine (SEROQUEL) 100 MG  tablet Take 100-200 mg by mouth at bedtime as needed (sleep). 11/24/21   [provider]  QUEtiapine (SEROQUEL) 25 MG tablet Take 50 mg by mouth at bedtime as needed (sleep). 05/24/20   [provider]  sodium chloride (OCEAN) 0.65 % SOLN nasal spray Place 1 spray into both nostrils as needed for congestion. 12/16/22   Lucillie Garfinkel, MD  torsemide (DEMADEX) 100 MG tablet Take 1 tablet (100 mg total) by mouth daily. 09/27/18   Loletha Grayer, MD  VELPHORO 500 MG chewable tablet Chew 1,000 mg by mouth 3 (three) times daily. 11/20/22   [provider]  Vitamin D, Ergocalciferol, (DRISDOL) 1.25 MG (50000 UNIT) CAPS capsule Take 50,000 Units by mouth once a week. 02/02/23   [provider]    Physical Exam    Vital Signs:  LUDMILA BYER does not have vital signs available for review today.  Given telephonic nature of communication, physical exam is limited. AAOx3. NAD. Normal affect.  Speech and respirations are unlabored.  Accessory Clinical Findings    None  Assessment & Plan    Primary Cardiologist: Ida Rogue, MD  Preoperative cardiovascular risk assessment. Extraction of 4 teeth.   Chart reviewed as part of pre-operative protocol coverage. According to the RCRI, patient has a 6.6% risk of MACE. Patient reports activity equivalent to 4.73 METS (personal hygiene, grocery shopping, moderate household chores).   Given past medical history and time since last visit, based on ACC/AHA guidelines, AJAI MELLOTT would be at acceptable risk for the planned procedure without further cardiovascular testing.   Patient was advised that if she develops new symptoms prior to surgery to contact our office to arrange a follow-up appointment.  she verbalized understanding.  2. Anti-coag.  Per pharm D: Procedure: 4 surgical extractions Date of procedure: TBD   CHA2DS2-VASc Score = 5  This indicates a 7.2% annual risk of stroke. The patient's score is based  upon: CHF History: 1 HTN History: 1 Diabetes History: 1 Stroke History: 0 Vascular Disease History: 1 Age Score: 0 Gender Score: 1   CrCl - ESRD on dialysis Platelet count 134K   Patient does not require SBE prophylaxis.   Per office protocol, patient can hold Eliquis for 2 days prior to procedure.    I will route this recommendation to the requesting party via Epic fax function.  Please call with questions.  Time:   Today, I have spent 7 minutes with the patient with telehealth technology discussing medical history, symptoms, and management plan.     Mayra Reel, NP  02/20/2023, 9:19 AM

## 2023-03-01 NOTE — Telephone Encounter (Signed)
Pt made aware and verbalized understanding.   Minna Merritts, MD  [facility] [place] [phone] minutes ago (7:58 AM)    Blood pressure and weight measurements reviewed Numbers look stable Thx TGollan

## 2023-04-25 ENCOUNTER — Other Ambulatory Visit: Payer: Self-pay | Admitting: Family Medicine

## 2023-04-25 DIAGNOSIS — Z1231 Encounter for screening mammogram for malignant neoplasm of breast: Secondary | ICD-10-CM

## 2023-05-03 ENCOUNTER — Other Ambulatory Visit: Payer: Self-pay | Admitting: Nurse Practitioner

## 2023-05-03 DIAGNOSIS — I483 Typical atrial flutter: Secondary | ICD-10-CM

## 2023-05-03 NOTE — Telephone Encounter (Signed)
Prescription refill request for Eliquis received. Indication: Aflutter Last office visit: 11/26/22 Mariah Milling)  Scr: 7.00 (12/06/22)  Age: 53 Weight: 85.8kg  Appropriate dose. Refill sent.

## 2023-05-03 NOTE — Telephone Encounter (Signed)
Refill request

## 2023-05-23 ENCOUNTER — Ambulatory Visit
Admission: RE | Admit: 2023-05-23 | Discharge: 2023-05-23 | Disposition: A | Discharge status: Home / Self Care | Payer: 59 | Source: Ambulatory Visit | Attending: Family Medicine | Admitting: Family Medicine

## 2023-05-23 DIAGNOSIS — Z1231 Encounter for screening mammogram for malignant neoplasm of breast: Secondary | ICD-10-CM | POA: Diagnosis present

## 2023-06-02 NOTE — Progress Notes (Deleted)
Cardiology Office Note  Date:  06/02/2023   ID:  Janet Olson, DOB 1970/12/01, MRN [id number]  PCP:  Armando Gang, FNP   No chief complaint on file.   HPI:  Janet Olson is a 53 year old woman with past medical history of ESRD On HD mon/wed/Fri for 4 years Smoker 13 cigarettes/day, COPD HTN/renal dz Anemia of chronic dz Asthma Diabetes  Left ventricular ejection fraction, by estimation, is 40 to 45%. Atrial flutter April and May 2023 with cardioversion May 01, 2022 Ascites requiring paracentesis Who presents for f/u of her pulmonary edema, atrial flutter, cardiomyopathy  Last seen in clinic by myself November 2023    seen by vascular, fistulogram noting pseudoaneurysm  Reports having periodic low blood pressure during and after dialysis Sometimes systolic down to 90 Had dialysis today, blood pressure today 90/50 Sometimes after dialysis, blood pressure will spike if holding medication Does not like it when her blood pressure shoots up if she holds her amlodipine or metoprolol  Denies chest pain, no lower extremity edema Denies any tachypalpitations concerning for atrial fibrillation or flutter No ascites Feels she may eat extra dialysis as she had more salt and fluids over holiday season  EKG personally reviewed by myself on todays visit Normal sinus rhythm rate 76 bpm T wave abnormality precordial leads  Other past medical history reviewed hospitalizations  admitted to [facility] between April 12 and April 13 2022 in the setting of altered mental status and acute uremia, after skipping dialysis for a week.  In the ED, she was initially minimally responsive, and she was admitted for uremic encephalopathy and hyperkalemia with emergent hemodialysis on April 12.   admission ECG showed rate controlled 2-1 atrial flutter at 94 bpm.    Her metoprolol dose was increased during her hospitalization of 75 mg twice daily.  Seen in the office in follow-up and  cardioversion was scheduled cardioversion May 01, 2022 for atrial flutter Normal sinus rhythm restored  She remains in normal sinus rhythm on today's visit, denies any tachypalpitations concerning for arrhythmia This week had HD sat, Monday, wed,   weight higher, more swelling, normal 3:45 hr, stopping early Fistula working slow, "main sticker having trouble", "has knots" Has appt with vascular June 1st to review fistula  Takes torsemide 100 mg daily, makes  Lots of ice intake , large bag a week Followed by Dr. Cherylann Ratel Some SOB, has albuterol nebs  Still smoking Leg swelling worse on right than left  Paracentesis 2 weeks ago,05/08/2022 Feels that it is coming back as dialysis has been more troublesome  Hospital 10/2020 paracentesis with 3.2 L fluid removal.   Blood culture returned positive for Enterococcus bacteremia.   started on IV ampicillin.   Tunneled hemodialysis catheter removed on 11/4.   echocardiogram which was negative for endocarditis.   TEE was tentatively scheduled for 11/8. She declined the study, left AMA  Echo 12/2020 done with primary care, normal LV function Unable to review images  Could not tolerate coreg, upset stomach  hospitalized 1/21, ascites requiring paracentesis with removal of 2 L fluid.  Patient reports that she has missed several recent dialysis (3 in total) as she was feeling sick.  Hospital 02/2020,  Secondary to volume overload after missing several recent dialysis sessions. paracentesis with 7.5 L clear ascites fluid removed  Echo 02/2020 results reviewed with her  1. Left ventricular ejection fraction, by estimation, is 50 to 55%. The  left ventricle has low normal function. The left ventricle has no  regional  wall motion abnormalities. There is moderate left ventricular hypertrophy.  -- interventricular septum is  flattened in diastole ('D' shaped left ventricle), consistent with right  ventricular volume overload.   --Right ventricular  systolic function is moderately reduced. The right  ventricular size is mildly enlarged. There is moderately elevated  pulmonary artery systolic pressure.  --Acites is present.. The pericardial effusion is posterior to the left  ventricle.   Moderately dilated pulmonary artery.    The inferior vena cava is dilated  right atrial pressure of 15 mmHg.    PMH:   has a past medical history of (HFpEF) heart failure with preserved ejection fraction (HCC), Abdominal ascites, Acute hypoxemic respiratory failure (HCC) (01/03/2019), Acute renal failure (ARF) (HCC) (05/17/2017), Acute respiratory failure (HCC) (09/25/2018), Anemia, Anxiety, Arthritis, Asthma, Atrial flutter (HCC), Cardiomegaly, COPD (chronic obstructive pulmonary disease) (HCC), Depression, Dialysis patient (HCC), Dyspnea, ESRD (end stage renal disease) (HCC), GERD (gastroesophageal reflux disease), Headache, Hypertension, Nonrheumatic tricuspid (valve) insufficiency, Pericardial effusion (01/26/2019), Respiratory failure with hypoxia (HCC) (04/21/2018), Ulcer, and Vitamin D deficiency.  PSH:    Past Surgical History:  Procedure Laterality Date   A/V FISTULAGRAM Left 04/17/2018   Procedure: A/V FISTULAGRAM;  Surgeon: Annice Needy, MD;  Location: [facility];  Service: Cardiovascular;  Laterality: Left;   A/V FISTULAGRAM Left 05/27/2018   Procedure: A/V FISTULAGRAM;  Surgeon: Renford Dills, MD;  Location: [facility];  Service: Cardiovascular;  Laterality: Left;   A/V FISTULAGRAM Left 07/05/2020   Procedure: A/V FISTULAGRAM;  Surgeon: Renford Dills, MD;  Location: [facility];  Service: Cardiovascular;  Laterality: Left;   A/V FISTULAGRAM Left 06/26/2022   Procedure: A/V Fistulagram;  Surgeon: Renford Dills, MD;  Location: [facility];  Service: Cardiovascular;  Laterality: Left;   AV FISTULA PLACEMENT Left 08/08/2017   Procedure: ARTERIOVENOUS (AV) FISTULA CREATION ( BRACHIOCEPHALIC );  Surgeon:  Annice Needy, MD;  Location: [facility];  Service: Vascular;  Laterality: Left;   AV FISTULA PLACEMENT Left 08/05/2020   Procedure: INSERTION OF ARTERIOVENOUS (AV) GRAFT ARM(BRACHIAL AXILLARY);  Surgeon: Renford Dills, MD;  Location: [facility];  Service: Vascular;  Laterality: Left;   CARDIOVERSION N/A 05/01/2022   Procedure: CARDIOVERSION;  Surgeon: Antonieta Iba, MD;  Location: [facility];  Service: Cardiovascular;  Laterality: N/A;   CESAREAN SECTION     x 4 (1988, 1991, 1997, 2000 )   COLONOSCOPY WITH PROPOFOL N/A 04/24/2018   Procedure: COLONOSCOPY WITH PROPOFOL;  Surgeon: Toney Reil, MD;  Location: [facility] END[facility];  Service: Gastroenterology;  Laterality: N/A;   COLONOSCOPY WITH PROPOFOL N/A 12/06/2022   Procedure: COLONOSCOPY WITH PROPOFOL;  Surgeon: Toney Reil, MD;  Location: [facility] ENDOSCOPY;  Service: Gastroenterology;  Laterality: N/A;   DIALYSIS/PERMA CATHETER INSERTION N/A 07/16/2017   Procedure: Dialysis/Perma Catheter Insertion;  Surgeon: Renford Dills, MD;  Location: [facility];  Service: Cardiovascular;  Laterality: N/A;   DIALYSIS/PERMA CATHETER REMOVAL N/A 10/29/2017   Procedure: DIALYSIS/PERMA CATHETER REMOVAL;  Surgeon: Renford Dills, MD;  Location: [facility];  Service: Cardiovascular;  Laterality: N/A;   DIALYSIS/PERMA CATHETER REMOVAL N/A 11/03/2020   Procedure: DIALYSIS/PERMA CATHETER REMOVAL;  Surgeon: Annice Needy, MD;  Location: [facility];  Service: Cardiovascular;  Laterality: N/A;   ESOPHAGOGASTRODUODENOSCOPY (EGD) WITH PROPOFOL N/A 12/06/2022   Procedure: ESOPHAGOGASTRODUODENOSCOPY (EGD) WITH PROPOFOL;  Surgeon: Toney Reil, MD;  Location: [facility] ENDOSCOPY;  Service: Gastroenterology;  Laterality: N/A;  excision of lymph nodes Bilateral 2014   bilateral under arms.   PARATHYROID IMPLANT REMOVAL     REMOVAL OF A DIALYSIS CATHETER Left 08/05/2020   Procedure: REMOVAL OF A DIALYSIS CATHETER ( FISTULA );   Surgeon: Renford Dills, MD;  Location: [facility];  Service: Vascular;  Laterality: Left;   SHOULDER ARTHROSCOPY WITH SUBACROMIAL DECOMPRESSION AND OPEN ROTATOR C Right 01/08/2022   Procedure: Right shoulder arthroscopic subscapularis repair, rotator cuff repair, subacromial decompression, and distal clavicle excision;  Surgeon: Signa Kell, MD;  Location: [facility];  Service: Orthopedics;  Laterality: Right;   TEE WITHOUT CARDIOVERSION N/A 05/01/2022   Procedure: TRANSESOPHAGEAL ECHOCARDIOGRAM (TEE);  Surgeon: Antonieta Iba, MD;  Location: [facility];  Service: Cardiovascular;  Laterality: N/A;    Current Outpatient Medications  Medication Sig Dispense Refill   albuterol (PROVENTIL) (2.5 MG/3ML) 0.083% nebulizer solution Take 2.5 mg by nebulization every 6 (six) hours as needed for wheezing or shortness of breath.      albuterol (VENTOLIN HFA) 108 (90 Base) MCG/ACT inhaler Inhale 2 puffs into the lungs every 6 (six) hours as needed for wheezing or shortness of breath.      amLODipine (NORVASC) 10 MG tablet Take 10 mg by mouth daily.     AURYXIA 1 GM 210 MG(Fe) tablet Take 420 mg by mouth 3 (three) times daily.     benzonatate (TESSALON) 200 MG capsule Take 400 mg by mouth 3 (three) times daily as needed for cough.     BREZTRI AEROSPHERE 160-9-4.8 MCG/ACT AERO SMARTSIG:2 Puff(s) By Mouth Twice Daily     CAPLYTA 10.5 MG CAPS Take 1 capsule by mouth daily.     Cholecalciferol 125 MCG (5000 UT) capsule Take by mouth.     cinacalcet (SENSIPAR) 30 MG tablet Take 1 tablet (30 mg total) by mouth every Monday, Wednesday, and Friday. 60 tablet 0   DULERA 200-5 MCG/ACT AERO Inhale 2 puffs into the lungs 2 (two) times daily.  0   DULoxetine (CYMBALTA) 60 MG capsule Take by mouth.     ELIQUIS 5 MG TABS tablet TAKE 1 TABLET(5 MG) BY MOUTH TWICE DAILY 60 tablet 11   furosemide (LASIX) 40 MG tablet Take 40 mg by mouth daily.     Heparin Sodium, Porcine, (HEPARIN LOCK FLUSH IJ) Inject 1 Dose as directed every  Monday, Wednesday, and Friday. At dialysis     hydrOXYzine (ATARAX) 10 MG tablet Take 10 mg by mouth 2 (two) times daily as needed.     hydrOXYzine (ATARAX) 25 MG tablet Take by mouth.     lanthanum (FOSRENOL) 1000 MG chewable tablet Chew 1,000 mg by mouth 3 (three) times daily.     lidocaine-prilocaine (EMLA) cream 1 application as needed (prior to port being accessed).     linaclotide (LINZESS) 72 MCG capsule Take 72 mcg by mouth every evening.     lumateperone tosylate (CAPLYTA) 42 MG capsule Take 42 mg by mouth at bedtime.     Metoprolol Succinate (KAPSPARGO SPRINKLE) 100 MG CS24 Take by mouth.     metoprolol tartrate (LOPRESSOR) 100 MG tablet Take 50 mg by mouth 2 (two) times daily.     multivitamin (RENA-VIT) TABS tablet Take 1 tablet by mouth daily.   1   pantoprazole (PROTONIX) 40 MG tablet Take 1 tablet (40 mg total) by mouth daily. 30 tablet 0   polyethylene glycol powder (GLYCOLAX/MIRALAX) 17 GM/SCOOP powder Take 255 g by mouth daily. 255 g 3   QUEtiapine (SEROQUEL) 100 MG tablet  Take 100-200 mg by mouth at bedtime as needed (sleep).     QUEtiapine (SEROQUEL) 25 MG tablet Take 50 mg by mouth at bedtime as needed (sleep).     sodium chloride (OCEAN) 0.65 % SOLN nasal spray Place 1 spray into both nostrils as needed for congestion. 60 mL 0   torsemide (DEMADEX) 100 MG tablet Take 1 tablet (100 mg total) by mouth daily. 30 tablet 0   VELPHORO 500 MG chewable tablet Chew 1,000 mg by mouth 3 (three) times daily.     Vitamin D, Ergocalciferol, (DRISDOL) 1.25 MG (50000 UNIT) CAPS capsule Take 50,000 Units by mouth once a week.     No current facility-administered medications for this visit.    Allergies:   Patient has no known allergies.   Social History:  The patient  reports that she has been smoking cigarettes. She has a 15.00 pack-year smoking history. She has never used smokeless tobacco. She reports that she does not drink alcohol and does not use drugs.   Family History:   family  history includes Diabetes in her mother.    Review of Systems: Review of Systems  Constitutional:  Positive for malaise/fatigue.  HENT: Negative.    Respiratory:  Positive for shortness of breath.   Cardiovascular:  Positive for leg swelling.  Gastrointestinal: Negative.   Musculoskeletal: Negative.   Neurological: Negative.   Psychiatric/Behavioral: Negative.    All other systems reviewed and are negative.    PHYSICAL EXAM: VS:  LMP 03/03/2018 (Approximate)  , BMI There is no height or weight on file to calculate BMI. Constitutional:  oriented to person, place, and time. No distress.  HENT:  Head: Grossly normal Eyes:  no discharge. No scleral icterus.  Neck: No JVD, no carotid bruits  Cardiovascular: Regular rate and rhythm, no murmurs appreciated Pulmonary/Chest: Clear to auscultation bilaterally, no wheezes or rails Abdominal: Soft.  no distension.  no tenderness.  Musculoskeletal: Normal range of motion Neurological:  normal muscle tone. Coordination normal. No atrophy Skin: Skin warm and dry Psychiatric: normal affect, pleasant   Recent Labs: 12/06/2022: BUN 18; Creatinine, Ser 7.00; Hemoglobin 10.2; Potassium 3.4; Sodium 144    Lipid Panel Lab Results  Component Value Date   CHOL 121 05/19/2021   HDL 70 05/19/2021   LDLCALC 39 05/19/2021   TRIG 49 05/19/2021      Wt Readings from Last 3 Encounters:  02/14/23 189 lb 2 oz (85.8 kg)  12/16/22 191 lb 12.8 oz (87 kg)  12/11/22 192 lb (87.1 kg)      ASSESSMENT AND PLAN: Preop cardiovascular evaluation Acceptable risk for EGD/colonoscopy next week  we have given her directions to hold Eliquis 3 days prior to procedure and restart at the discretion of the GI physician performing endoscopy No further cardiac testing needed We have recommended she take her blood pressure medications morning of the procedure  ESRD on dialysis [facility]) -  Therapy on Monday Wednesday Friday, Sometimes Saturday Some low  pressures Recommend she hold amlodipine prior to dialysis, take this at the end of her session Continue metoprolol 100 twice daily  Chronic obstructive pulmonary disease, unspecified COPD type (HCC) :  Long history of smoking, still smoking 13 cigarettes/day On nebulizers/inhalers Cessation recommended  Anemia in chronic kidney disease, on chronic dialysis (HCC) - Anemia managed by nephrology, appears stable On Epogen Hgb 12.2  Atrial flutter  hospital admission April 2023, underwent cardioversion,  maintaining normal sinus rhythm Continue metoprolol tartrate 100 twice daily with Eliquis  benign  hypertensive renal disease - Plan: EKG 12-Lead Labile pressure with dialysis Recommend she hold amlodipine prior to hemodialysis, take this at the end of her session  Cardiac hypertrophy Secondary to longstanding hypertension  Ascites Typically well controlled with aggressive hemodialysis No ascites   Total encounter time more than 40 minutes  Greater than 50% was spent in counseling and coordination of care with the patient     Signed, Dossie Arbour, M.D., Ph.D. 06/02/2023  [facility] [place], [place] [phone]

## 2023-06-03 ENCOUNTER — Ambulatory Visit: Payer: 59 | Attending: Cardiovascular Disease | Admitting: Cardiovascular Disease

## 2023-06-03 DIAGNOSIS — I5032 Chronic diastolic (congestive) heart failure: Secondary | ICD-10-CM

## 2023-06-03 DIAGNOSIS — I483 Typical atrial flutter: Secondary | ICD-10-CM

## 2023-06-03 DIAGNOSIS — I700 Atherosclerosis of aorta: Secondary | ICD-10-CM

## 2023-06-03 DIAGNOSIS — I10 Essential (primary) hypertension: Secondary | ICD-10-CM

## 2023-06-03 DIAGNOSIS — J449 Chronic obstructive pulmonary disease, unspecified: Secondary | ICD-10-CM

## 2023-06-03 DIAGNOSIS — N186 End stage renal disease: Secondary | ICD-10-CM

## 2023-06-03 DIAGNOSIS — E1169 Type 2 diabetes mellitus with other specified complication: Secondary | ICD-10-CM

## 2023-06-03 DIAGNOSIS — I272 Pulmonary hypertension, unspecified: Secondary | ICD-10-CM

## 2023-06-03 DIAGNOSIS — Z720 Tobacco use: Secondary | ICD-10-CM

## 2023-06-04 ENCOUNTER — Encounter: Payer: Self-pay | Admitting: Cardiovascular Disease

## 2023-06-24 NOTE — Progress Notes (Unsigned)
Cardiology Office Note  Date:  06/26/2023   ID:  GINNY LOOMER, DOB 1970/08/31, MRN [id number]  PCP:  Armando Gang, FNP   Chief Complaint  Patient presents with   6 month follow up     Patient c/o bruising all over due to Eliquis & a decrease in blood pressure when having dialysis. Medications reviewed by the patient verbally.     HPI:  Ms. Buikema is a 53 year old woman with past medical history of ESRD On HD mon/wed/Fri for 4 years Smoker 13 cigarettes/day, COPD HTN/renal dz Anemia of chronic dz Asthma Diabetes  Left ventricular ejection fraction, by estimation, is 40 to 45%. Atrial flutter April and May 2023 with cardioversion May 01, 2022 Ascites requiring paracentesis Who presents for f/u of her pulmonary edema, atrial flutter, cardiomyopathy  Last seen in clinic by myself November 2023  In follow-up today reports that she is frustrated by drops in her blood pressure during dialysis causing her to get pulled off dialysis or causing orthostasis symptoms She does not like to stop her metoprolol or amlodipine before dialysis as blood pressure runs high and she gets a headache Takes amlodipine 2.5 in the evenings, metoprolol tartrate 50 twice daily  By holding medication, makes head hurt, BP elevated  Bruising on legs with eliquis  EKG personally reviewed by myself on todays visit EKG Interpretation  Date/Time:  Wednesday June 26 2023 15:23:04 EDT Ventricular Rate:  101 PR Interval:  218 QRS Duration: 94 QT Interval:  370 QTC Calculation: 479 R Axis:   72 Text Interpretation: Sinus tachycardia with 1st degree A-V block Possible Left atrial enlargement T wave abnormality, consider anterolateral ischemia When compared with ECG of 01-May-2022 07:09, PREVIOUS ECG IS PRESENT Confirmed by Julien Nordmann [phone]) on 06/26/2023 3:32:44 PM    Other past medical history reviewed hospitalizations  admitted to [facility] between April 12 and April 13 2022 in the  setting of altered mental status and acute uremia, after skipping dialysis for a week.  In the ED, she was initially minimally responsive, and she was admitted for uremic encephalopathy and hyperkalemia with emergent hemodialysis on April 12.   admission ECG showed rate controlled 2-1 atrial flutter at 94 bpm.    Her metoprolol dose was increased during her hospitalization of 75 mg twice daily.  Seen in the office in follow-up and cardioversion was scheduled cardioversion May 01, 2022 for atrial flutter Normal sinus rhythm restored  She remains in normal sinus rhythm on today's visit, denies any tachypalpitations concerning for arrhythmia This week had HD sat, Monday, wed,   weight higher, more swelling, normal 3:45 hr, stopping early Fistula working slow, "main sticker having trouble", "has knots" Has appt with vascular June 1st to review fistula  Takes torsemide 100 mg daily, makes  Lots of ice intake , large bag a week Followed by Dr. Cherylann Ratel Some SOB, has albuterol nebs  Still smoking Leg swelling worse on right than left  Paracentesis 2 weeks ago,05/08/2022 Feels that it is coming back as dialysis has been more troublesome  Hospital 10/2020 paracentesis with 3.2 L fluid removal.   Blood culture returned positive for Enterococcus bacteremia.   started on IV ampicillin.   Tunneled hemodialysis catheter removed on 11/4.   echocardiogram which was negative for endocarditis.   TEE was tentatively scheduled for 11/8. She declined the study, left AMA  Echo 12/2020 done with primary care, normal LV function Unable to review images  Could not tolerate coreg, upset stomach  hospitalized  1/21, ascites requiring paracentesis with removal of 2 L fluid.  Patient reports that she has missed several recent dialysis (3 in total) as she was feeling sick.  Hospital 02/2020,  Secondary to volume overload after missing several recent dialysis sessions. paracentesis with 7.5 L clear ascites fluid  removed  Echo 02/2020 results reviewed with her  1. Left ventricular ejection fraction, by estimation, is 50 to 55%. The  left ventricle has low normal function. The left ventricle has no regional  wall motion abnormalities. There is moderate left ventricular hypertrophy.  -- interventricular septum is  flattened in diastole ('D' shaped left ventricle), consistent with right  ventricular volume overload.   --Right ventricular systolic function is moderately reduced. The right  ventricular size is mildly enlarged. There is moderately elevated  pulmonary artery systolic pressure.  --Acites is present.. The pericardial effusion is posterior to the left  ventricle.   Moderately dilated pulmonary artery.    The inferior vena cava is dilated  right atrial pressure of 15 mmHg.    PMH:   has a past medical history of (HFpEF) heart failure with preserved ejection fraction (HCC), Abdominal ascites, Acute hypoxemic respiratory failure (HCC) (01/03/2019), Acute renal failure (ARF) (HCC) (05/17/2017), Acute respiratory failure (HCC) (09/25/2018), Anemia, Anxiety, Arthritis, Asthma, Atrial flutter (HCC), Cardiomegaly, COPD (chronic obstructive pulmonary disease) (HCC), Depression, Dialysis patient (HCC), Dyspnea, ESRD (end stage renal disease) (HCC), GERD (gastroesophageal reflux disease), Headache, Hypertension, Nonrheumatic tricuspid (valve) insufficiency, Pericardial effusion (01/26/2019), Respiratory failure with hypoxia (HCC) (04/21/2018), Ulcer, and Vitamin D deficiency.  PSH:    Past Surgical History:  Procedure Laterality Date   A/V FISTULAGRAM Left 04/17/2018   Procedure: A/V FISTULAGRAM;  Surgeon: Annice Needy, MD;  Location: [facility];  Service: Cardiovascular;  Laterality: Left;   A/V FISTULAGRAM Left 05/27/2018   Procedure: A/V FISTULAGRAM;  Surgeon: Renford Dills, MD;  Location: [facility];  Service: Cardiovascular;  Laterality: Left;   A/V FISTULAGRAM Left 07/05/2020    Procedure: A/V FISTULAGRAM;  Surgeon: Renford Dills, MD;  Location: [facility];  Service: Cardiovascular;  Laterality: Left;   A/V FISTULAGRAM Left 06/26/2022   Procedure: A/V Fistulagram;  Surgeon: Renford Dills, MD;  Location: [facility];  Service: Cardiovascular;  Laterality: Left;   AV FISTULA PLACEMENT Left 08/08/2017   Procedure: ARTERIOVENOUS (AV) FISTULA CREATION ( BRACHIOCEPHALIC );  Surgeon: Annice Needy, MD;  Location: [facility];  Service: Vascular;  Laterality: Left;   AV FISTULA PLACEMENT Left 08/05/2020   Procedure: INSERTION OF ARTERIOVENOUS (AV) GRAFT ARM(BRACHIAL AXILLARY);  Surgeon: Renford Dills, MD;  Location: [facility];  Service: Vascular;  Laterality: Left;   CARDIOVERSION N/A 05/01/2022   Procedure: CARDIOVERSION;  Surgeon: Antonieta Iba, MD;  Location: [facility];  Service: Cardiovascular;  Laterality: N/A;   CESAREAN SECTION     x 4 (1988, 1991, 1997, 2000 )   COLONOSCOPY WITH PROPOFOL N/A 04/24/2018   Procedure: COLONOSCOPY WITH PROPOFOL;  Surgeon: Toney Reil, MD;  Location: [facility] ENDOSCOPY;  Service: Gastroenterology;  Laterality: N/A;   COLONOSCOPY WITH PROPOFOL N/A 12/06/2022   Procedure: COLONOSCOPY WITH PROPOFOL;  Surgeon: Toney Reil, MD;  Location[facility] ENDOSCOPY;  Service: Gastroenterology;  Laterality: N/A;   DIALYSIS/PERMA CATHETER INSERTION N/A 07/16/2017   Procedure: Dialysis/Perma Catheter Insertion;  Surgeon: Renford Dills, MD;  Location: [facility];  Service: Cardiovascular;  Laterality: N/A;   DIALYSIS/PERMA CATHETER REMOVAL N/A 10/29/2017   Procedure: DIALYSIS/PERMA CATHETER  REMOVAL;  Surgeon: Renford Dills, MD;  Location: [facility];  Service: Cardiovascular;  Laterality: N/A;   DIALYSIS/PERMA CATHETER REMOVAL N/A 11/03/2020   Procedure: DIALYSIS/PERMA CATHETER REMOVAL;  Surgeon: Annice Needy, MD;  Location: [facility];  Service: Cardiovascular;  Laterality: N/A;    ESOPHAGOGASTRODUODENOSCOPY (EGD) WITH PROPOFOL N/A 12/06/2022   Procedure: ESOPHAGOGASTRODUODENOSCOPY (EGD) WITH PROPOFOL;  Surgeon: Toney Reil, MD;  Location: [facility] ENDOSCOPY;  Service: Gastroenterology;  Laterality: N/A;   excision of lymph nodes Bilateral 2014   bilateral under arms.   PARATHYROID IMPLANT REMOVAL     REMOVAL OF A DIALYSIS CATHETER Left 08/05/2020   Procedure: REMOVAL OF A DIALYSIS CATHETER ( FISTULA );  Surgeon: Renford Dills, MD;  Location: [facility];  Service: Vascular;  Laterality: Left;   SHOULDER ARTHROSCOPY WITH SUBACROMIAL DECOMPRESSION AND OPEN ROTATOR C Right 01/08/2022   Procedure: Right shoulder arthroscopic subscapularis repair, rotator cuff repair, subacromial decompression, and distal clavicle excision;  Surgeon: Signa Kell, MD;  Location: [facility];  Service: Orthopedics;  Laterality: Right;   TEE WITHOUT CARDIOVERSION N/A 05/01/2022   Procedure: TRANSESOPHAGEAL ECHOCARDIOGRAM (TEE);  Surgeon: Antonieta Iba, MD;  Location: [facility];  Service: Cardiovascular;  Laterality: N/A;    Current Outpatient Medications  Medication Sig Dispense Refill   albuterol (PROVENTIL) (2.5 MG/3ML) 0.083% nebulizer solution Take 2.5 mg by nebulization every 6 (six) hours as needed for wheezing or shortness of breath.      albuterol (VENTOLIN HFA) 108 (90 Base) MCG/ACT inhaler Inhale 2 puffs into the lungs every 6 (six) hours as needed for wheezing or shortness of breath.      amLODipine (NORVASC) 5 MG tablet Take 5 mg by mouth daily.     AURYXIA 1 GM 210 MG(Fe) tablet Take 420 mg by mouth 3 (three) times daily.     benzonatate (TESSALON) 200 MG capsule Take 400 mg by mouth 3 (three) times daily as needed for cough.     BREZTRI AEROSPHERE 160-9-4.8 MCG/ACT AERO SMARTSIG:2 Puff(s) By Mouth Twice Daily     CAPLYTA 10.5 MG CAPS Take 1 capsule by mouth daily.     Cholecalciferol 125 MCG (5000 UT) capsule Take by mouth.     ELIQUIS 5 MG TABS tablet TAKE 1 TABLET(5 MG) BY MOUTH  TWICE DAILY 60 tablet 11   furosemide (LASIX) 40 MG tablet Take 40 mg by mouth daily.     hydrOXYzine (ATARAX) 25 MG tablet Take by mouth.     lidocaine-prilocaine (EMLA) cream 1 application as needed (prior to port being accessed).     linaclotide (LINZESS) 72 MCG capsule Take 72 mcg by mouth every evening.     lumateperone tosylate (CAPLYTA) 42 MG capsule Take 42 mg by mouth at bedtime.     metoprolol tartrate (LOPRESSOR) 100 MG tablet Take 50 mg by mouth 2 (two) times daily.     multivitamin (RENA-VIT) TABS tablet Take 1 tablet by mouth daily.   1   pantoprazole (PROTONIX) 40 MG tablet Take 1 tablet (40 mg total) by mouth daily. 30 tablet 0   QUEtiapine (SEROQUEL) 100 MG tablet Take 100-200 mg by mouth at bedtime as needed (sleep).     QUEtiapine (SEROQUEL) 25 MG tablet Take 50 mg by mouth at bedtime as needed (sleep).     Vitamin D, Ergocalciferol, (DRISDOL) 1.25 MG (50000 UNIT) CAPS capsule Take 50,000 Units by mouth once a week.     cinacalcet (SENSIPAR) 30 MG tablet Take 1 tablet (30  mg total) by mouth every Monday, Wednesday, and Friday. (Patient not taking: Reported on 06/26/2023) 60 tablet 0   DULERA 200-5 MCG/ACT AERO Inhale 2 puffs into the lungs 2 (two) times daily. (Patient not taking: Reported on 06/26/2023)  0   DULoxetine (CYMBALTA) 60 MG capsule Take by mouth. (Patient not taking: Reported on 06/26/2023)     Heparin Sodium, Porcine, (HEPARIN LOCK FLUSH IJ) Inject 1 Dose as directed every Monday, Wednesday, and Friday. At dialysis (Patient not taking: Reported on 06/26/2023)     hydrOXYzine (ATARAX) 10 MG tablet Take 10 mg by mouth 2 (two) times daily as needed. (Patient not taking: Reported on 06/26/2023)     lanthanum (FOSRENOL) 1000 MG chewable tablet Chew 1,000 mg by mouth 3 (three) times daily. (Patient not taking: Reported on 06/26/2023)     polyethylene glycol powder (GLYCOLAX/MIRALAX) 17 GM/SCOOP powder Take 255 g by mouth daily. (Patient not taking: Reported on 06/26/2023) 255 g  3   sodium chloride (OCEAN) 0.65 % SOLN nasal spray Place 1 spray into both nostrils as needed for congestion. (Patient not taking: Reported on 06/26/2023) 60 mL 0   VELPHORO 500 MG chewable tablet Chew 1,000 mg by mouth 3 (three) times daily. (Patient not taking: Reported on 06/26/2023)     No current facility-administered medications for this visit.    Allergies:   Patient has no known allergies.   Social History:  The patient  reports that she has been smoking cigarettes. She has a 15.00 pack-year smoking history. She has never used smokeless tobacco. She reports that she does not drink alcohol and does not use drugs.   Family History:   family history includes Diabetes in her mother.    Review of Systems: Review of Systems  Constitutional:  Positive for malaise/fatigue.  HENT: Negative.    Respiratory:  Positive for shortness of breath.   Cardiovascular:  Positive for leg swelling.  Gastrointestinal: Negative.   Musculoskeletal: Negative.   Neurological: Negative.   Psychiatric/Behavioral: Negative.    All other systems reviewed and are negative.   PHYSICAL EXAM: VS:  BP 110/70 (BP Location: Left Arm, Patient Position: Sitting, Cuff Size: Normal)   Pulse (!) 101   Ht 5\' 6"  (1.676 m)   Wt 194 lb (88 kg)   LMP 03/03/2018 (Approximate)   SpO2 95%   BMI 31.31 kg/m  , BMI Body mass index is 31.31 kg/m. Constitutional:  oriented to person, place, and time. No distress.  HENT:  Head: Grossly normal Eyes:  no discharge. No scleral icterus.  Neck: No JVD, no carotid bruits  Cardiovascular: Regular rate and rhythm, no murmurs appreciated Pulmonary/Chest: Clear to auscultation bilaterally, no wheezes or rails Abdominal: Soft.  no distension.  no tenderness.  Musculoskeletal: Normal range of motion Neurological:  normal muscle tone. Coordination normal. No atrophy Skin: Skin warm and dry Psychiatric: normal affect, pleasant   Recent Labs: 12/06/2022: BUN 18; Creatinine, Ser  7.00; Hemoglobin 10.2; Potassium 3.4; Sodium 144    Lipid Panel Lab Results  Component Value Date   CHOL 121 05/19/2021   HDL 70 05/19/2021   LDLCALC 39 05/19/2021   TRIG 49 05/19/2021      Wt Readings from Last 3 Encounters:  06/26/23 194 lb (88 kg)  02/14/23 189 lb 2 oz (85.8 kg)  12/16/22 191 lb 12.8 oz (87 kg)     ASSESSMENT AND PLAN:  ESRD on dialysis [facility]) -  Therapy on Monday Wednesday Friday, Sometimes Saturday For hypotension during dialysis leading  to orthostasis symptoms and general malaise, Recommend she take midodrine 5 up to 10 mg 1 hours prior to dialysis She has reported holding amlodipine or metoprolol before dialysis typically does not help her hypotension and often leads to headaches and general malaise  Chronic obstructive pulmonary disease, unspecified COPD type (HCC) :  Long history of smoking,  Smoking cessation recommended On nebulizers/inhalers  Anemia in chronic kidney disease, on chronic dialysis (HCC) - Anemia managed by nephrology, appears stable On Epogen Hgb 12  Atrial flutter  hospital admission April 2023, underwent cardioversion,  maintaining normal sinus rhythm Continue metoprolol tartrate 50 twice daily with Eliquis 5 twice daily Appears dose of metoprolol was reduced for hypotension  benign hypertensive renal disease -  No medication changes made Recommend she consider adding the midodrine 5 up to 10 mg 1 hour before dialysis as detailed above  Cardiac hypertrophy Secondary to longstanding hypertension  Ascites Typically well controlled with aggressive hemodialysis No ascites   Total encounter time more than 40 minutes  Greater than 50% was spent in counseling and coordination of care with the patient     Signed, Dossie Arbour, M.D., Ph.D. 06/26/2023  [facility] [place], [place] [phone]

## 2023-06-26 ENCOUNTER — Encounter: Payer: Self-pay | Admitting: Cardiovascular Disease

## 2023-06-26 ENCOUNTER — Ambulatory Visit: Payer: 59 | Attending: Cardiovascular Disease | Admitting: Cardiovascular Disease

## 2023-06-26 ENCOUNTER — Other Ambulatory Visit: Payer: Self-pay

## 2023-06-26 VITALS — BP 110/70 | HR 101 | Ht 66.0 in | Wt 194.0 lb

## 2023-06-26 DIAGNOSIS — I483 Typical atrial flutter: Secondary | ICD-10-CM

## 2023-06-26 DIAGNOSIS — J449 Chronic obstructive pulmonary disease, unspecified: Secondary | ICD-10-CM

## 2023-06-26 DIAGNOSIS — N186 End stage renal disease: Secondary | ICD-10-CM | POA: Diagnosis not present

## 2023-06-26 DIAGNOSIS — R002 Palpitations: Secondary | ICD-10-CM

## 2023-06-26 DIAGNOSIS — I10 Essential (primary) hypertension: Secondary | ICD-10-CM | POA: Diagnosis not present

## 2023-06-26 DIAGNOSIS — E1169 Type 2 diabetes mellitus with other specified complication: Secondary | ICD-10-CM

## 2023-06-26 DIAGNOSIS — Z992 Dependence on renal dialysis: Secondary | ICD-10-CM

## 2023-06-26 DIAGNOSIS — I5032 Chronic diastolic (congestive) heart failure: Secondary | ICD-10-CM

## 2023-06-26 DIAGNOSIS — Z720 Tobacco use: Secondary | ICD-10-CM

## 2023-06-26 DIAGNOSIS — I272 Pulmonary hypertension, unspecified: Secondary | ICD-10-CM

## 2023-06-26 DIAGNOSIS — E785 Hyperlipidemia, unspecified: Secondary | ICD-10-CM

## 2023-06-26 DIAGNOSIS — I700 Atherosclerosis of aorta: Secondary | ICD-10-CM

## 2023-06-26 MED ORDER — MIDODRINE HCL 10 MG PO TABS: 10.0000 mg | ORAL_TABLET | ORAL | 3 refills | Status: DC

## 2023-06-26 MED ORDER — MIDODRINE HCL 10 MG PO TABS
10.0000 mg | ORAL_TABLET | ORAL | 3 refills | Status: DC
  Filled 2023-06-26: qty 36, 28d supply, fill #0

## 2023-06-26 NOTE — Patient Instructions (Addendum)
Medication Instructions:  Midodrine 10 mg before dialysis Try 1/2 pill to start Take 1 hour before dialysis to push blood pressure up   If you need a refill on your cardiac medications before your next appointment, please call your pharmacy.   Lab work: No new labs needed  Testing/Procedures: No new testing needed  Follow-Up: At [facility], you and your health needs are our priority.  As part of our continuing mission to provide you with exceptional heart care, we have created designated Provider Care Teams.  These Care Teams include your primary Cardiologist (physician) and Advanced Practice Providers (APPs -  Physician Assistants and Nurse Practitioners) who all work together to provide you with the care you need, when you need it.  You will need a follow up appointment in 6 months  Providers on your designated Care Team:   Nicolasa Ducking, NP Eula Listen, PA-C Cadence Fransico Michael, [place]  COVID-19 Vaccine Information can be found at: [email or web address] For questions related to vaccine distribution or appointments, please email [email or web address] or call [phone].

## 2023-09-09 ENCOUNTER — Other Ambulatory Visit: Payer: Self-pay | Admitting: Nephrology

## 2023-09-09 DIAGNOSIS — R188 Other ascites: Secondary | ICD-10-CM

## 2023-09-10 ENCOUNTER — Ambulatory Visit: Payer: 59

## 2023-09-12 ENCOUNTER — Ambulatory Visit
Admission: RE | Admit: 2023-09-12 | Discharge: 2023-09-12 | Disposition: A | Discharge status: Home / Self Care | Payer: 59 | Source: Ambulatory Visit | Attending: Nephrology | Admitting: Nephrology

## 2023-09-12 ENCOUNTER — Other Ambulatory Visit: Payer: Self-pay | Admitting: Nephrology

## 2023-09-12 DIAGNOSIS — R188 Other ascites: Secondary | ICD-10-CM

## 2023-09-12 NOTE — Progress Notes (Signed)
Patient presented today for paracentesis, ultrasound imaging was obtained and revealed no ascites, therefore paracentesis was not performed. Findings discussed with the patient today who verbalized understanding of the plan.   Electronically Signed: Berneta Levins, PA-C 09/12/2023, 9:43 AM

## 2023-09-17 ENCOUNTER — Other Ambulatory Visit: Payer: Self-pay | Admitting: Physician Assistant

## 2023-09-17 DIAGNOSIS — M5416 Radiculopathy, lumbar region: Secondary | ICD-10-CM

## 2023-09-24 ENCOUNTER — Ambulatory Visit
Admission: RE | Admit: 2023-09-24 | Discharge: 2023-09-24 | Disposition: A | Discharge status: Home / Self Care | Payer: 59 | Source: Ambulatory Visit | Attending: Physician Assistant | Admitting: Physician Assistant

## 2023-09-24 DIAGNOSIS — M5416 Radiculopathy, lumbar region: Secondary | ICD-10-CM | POA: Insufficient documentation

## 2023-10-23 ENCOUNTER — Other Ambulatory Visit: Payer: Self-pay | Admitting: Family Medicine

## 2023-10-23 DIAGNOSIS — G959 Disease of spinal cord, unspecified: Secondary | ICD-10-CM

## 2023-10-24 ENCOUNTER — Ambulatory Visit
Admission: RE | Admit: 2023-10-24 | Discharge: 2023-10-24 | Disposition: A | Discharge status: Home / Self Care | Payer: 59 | Source: Ambulatory Visit | Attending: Family Medicine | Admitting: Family Medicine

## 2023-10-24 DIAGNOSIS — G959 Disease of spinal cord, unspecified: Secondary | ICD-10-CM

## 2023-11-12 ENCOUNTER — Telehealth: Payer: Self-pay | Admitting: Cardiovascular Disease

## 2023-11-12 DIAGNOSIS — I5032 Chronic diastolic (congestive) heart failure: Secondary | ICD-10-CM

## 2023-11-12 NOTE — Telephone Encounter (Signed)
Pt c/o BP issue: STAT if pt c/o blurred vision, one-sided weakness or slurred speech  1. What are your last 5 BP readings?  89/52, 102/??  2. Are you having any other symptoms (ex. Dizziness, headache, blurred vision, passed out)?  No, patient states she feels fine + denies SOB  3. What is your BP issue?   Patient states her dialysis doctor, Dr. Cherylann Ratel, recommends an echo and asked her why she hasn't had one done. She states her BP was been dropping while she's in dialysis. She mentions that she takes Midodrine to keep her BP up, but doesn't take any before dialysis.

## 2023-11-18 ENCOUNTER — Telehealth: Payer: Self-pay | Admitting: Cardiovascular Disease

## 2023-11-18 NOTE — Telephone Encounter (Signed)
Called patient and notified her of the following from Dr. Mariah Milling.  We can order echocardiogram  But make sure she is not taking amlodipine or metoprolol before dialysis  Also hold amlodipine and metoprolol night before dialysis  Thx  TGollan   Patient verbalizes understanding. Order placed for Echocardiogram.

## 2023-11-18 NOTE — Addendum Note (Signed)
Addended by: Jani Gravel on: 11/18/2023 01:13 PM   Modules accepted: Orders

## 2023-11-18 NOTE — Telephone Encounter (Signed)
Left voice mail, pt needs to be scheduled for echo.

## 2023-11-20 NOTE — Telephone Encounter (Signed)
Left voicemail to be scheduled for echo

## 2023-12-12 NOTE — Progress Notes (Unsigned)
Cardiology Clinic Note   Date: 12/12/2023 ID: SAYYORA KORVER, DOB [birth year], MRN [id number]  Primary Cardiologist:  Julien Nordmann, MD  Patient Profile    Janet Olson is a 53 y.o. female who presents to the clinic today for ***    Past medical history significant for: PAF/a-flutter. DCCV 05/01/2022. Chronic HFmrEF. TEE 05/01/2022: EF 40 to 45%.  No RWMA.  Mildly reduced RV function.  Mild RVH.  Moderate BAE.  No LA/LAA thrombus.  Mild to moderate MR.  Moderate TR.  Moderate grade 3 atheroma plaque involving the aortic arch.  No evidence of interatrial shunt. Hypertension. Hyperlipidemia. COPD. GERD. ESRD. On hemodialysis MWF. Tobacco abuse.  In summary, patient was initially evaluated in January 2019 for abnormal echo and diastolic dysfunction.  At that time she felt she was having issues with volume control.  Repeat echo January 2020 showed normal LV function, EF 55 to 60%, moderately elevated pulmonary pressures.  Serial repeat echoes in February 2021, November 2021, and January 2022 showed low normal to normal LV function with severely dilated right atrium and RVSP 29.9 mmHg.  She was admitted to [facility] in April 2023 for altered mental status and acute uremia in the setting of skipping dialysis for a week.  She was admitted for uremic encephalopathy and hyperkalemia with emergent hemodialysis performed.  Admission EKG showed rate controlled a-flutter.  She was not on a monitor during admission.  Her metoprolol was increased during her hospitalization but she was not started on anticoagulation.  She was evaluated for hospital follow-up in the office and complained of fatigue and DOE.  She reported tachycardia during dialysis.  EKG showed a-flutter, 118 bpm.  She was started on Eliquis and underwent TEE/cardioversion on 05/01/2022.  Patient was seen in the office by Dr. Mariah Milling on 11/26/2022 for routine follow-up.  She was maintaining sinus rhythm at that time.  She reported periodic  hypotension during and after dialysis.  After dialysis she will occasionally experience hypertension if she holds medications.  It was recommended she hold amlodipine prior to dialysis and take it at the end of her session.     History of Present Illness    Janet Olson is followed by Dr. Mariah Milling for the above outlined history.   Patient was last seen in the office by Dr. Mariah Milling on 06/26/2023 for routine follow-up.  She reported continued hypotension during dialysis causing dialysis to be stopped early.  She also reported symptomatic orthostasis at those times.  She reported if she holds metoprolol or amlodipine before dialysis she will become hypertensive and develop a headache.  She also did not feel holding medications helped hypotension.  EKG at the time of her visit showed sinus tachycardia.  It was recommended she add midodrine 5 to 10 mg 1 hour before dialysis.  Patient contacted the office on 11/12/2023 with reports of continued hypotension during dialysis.  Per triage RN: "Spoke with patient and she stated that she takes the midodrine (PROAMATINE) 10 MG tablet 1 hour before dialysis as recommended by Dr. Mariah Milling however the last couple times at dialysis she has been having syncopal episodes related to her BP dropping to low 100s systolic. Patient stated that [name], MD recommends she have an echocardiogram as her last one was 2021."  Dr. Mariah Milling ordered echo but it does not appear to have been scheduled.  Today, patient ***  PAF/a-flutter DCCV May 2023.  Patient***.  Denies spontaneous bleeding concerns.  EKG*** -Continue metoprolol, Eliquis.  Chronic HFmrEF  TEE May 2023 showed EF 40 to 45%, mildly reduced RV function, mild RVH, moderate BAE, mild to moderate MR, moderate TR.  Patient*** Euvolemic and well compensated on exam.  GDMT limited secondary to ESRD. -Continue metoprolol, Lasix. -Volume managed by nephrology.  Hypertension BP today***  Hypotension during  hemodialysis Patient reports ongoing issues with hypotension during dialysis.  She has tried holding metoprolol and amlodipine prior to dialysis but will have hypertension following dialysis with headache.  She is taking midodrine 1 hour before dialysis*** -***  Tobacco abuse Patient***   ROS: All other systems reviewed and are otherwise negative except as noted in History of Present Illness.  Studies Reviewed       ***  Risk Assessment/Calculations    {Does this patient have ATRIAL FIBRILLATION?[phone]} No BP recorded.  {Refresh Note OR Click here to enter BP  :1}***        Physical Exam    VS:  LMP 03/03/2018 (Approximate)  , BMI There is no height or weight on file to calculate BMI.  GEN: Well nourished, well developed, in no acute distress. Neck: No JVD or carotid bruits. Cardiac: *** RRR. No murmurs. No rubs or gallops.   Respiratory:  Respirations regular and unlabored. Clear to auscultation without rales, wheezing or rhonchi. GI: Soft, nontender, nondistended. Extremities: Radials/DP/PT 2+ and equal bilaterally. No clubbing or cyanosis. No edema ***  Skin: Warm and dry, no rash. Neuro: Strength intact.  Assessment & Plan   ***  Disposition: ***     {Are you ordering a CV Procedure (e.g. stress test, cath, DCCV, TEE, etc)?   Press F2        :[id number]}   Signed, Etta Grandchild. Lyonel Morejon, DNP, NP-C

## 2023-12-13 ENCOUNTER — Ambulatory Visit: Payer: 59 | Admitting: Student

## 2023-12-18 NOTE — Progress Notes (Signed)
Cardiology Clinic Note   Date: 12/20/2023 ID: ANAYJAH ZAKRZEWSKI, DOB 01/10/1970, MRN [id number]  Primary Cardiologist:  Janet Nordmann, MD  Patient Profile    Janet Olson is a 53 y.o. female who presents to the clinic today for evaluation of hypotension during dialysis.     Past medical history significant for: PAF/a-flutter. DCCV 05/01/2022. Chronic HFmrEF. TEE 05/01/2022: EF 40 to 45%.  No RWMA.  Mildly reduced RV function.  Mild RVH.  Moderate BAE.  No LA/LAA thrombus.  Mild to moderate MR.  Moderate TR.  Moderate grade 3 atheroma plaque involving the aortic arch.  No evidence of interatrial shunt. Hypertension. Hyperlipidemia. COPD. GERD. ESRD. On hemodialysis MWF. Tobacco abuse.  In summary, patient was initially evaluated in January 2019 for abnormal echo and diastolic dysfunction.  At that time she felt she was having issues with volume control.  Repeat echo January 2020 showed normal LV function, EF 55 to 60%, moderately elevated pulmonary pressures.  Serial repeat echoes in February 2021, November 2021, and January 2022 showed low normal to normal LV function with severely dilated right atrium and RVSP 29.9 mmHg.  She was admitted to [facility] in April 2023 for altered mental status and acute uremia in the setting of skipping dialysis for a week.  She was admitted for uremic encephalopathy and hyperkalemia with emergent hemodialysis performed.  Admission EKG showed rate controlled a-flutter.  She was not on a monitor during admission.  Her metoprolol was increased during her hospitalization but she was not started on anticoagulation.  She was evaluated for hospital follow-up in the office and complained of fatigue and DOE.  She reported tachycardia during dialysis.  EKG showed a-flutter, 118 bpm.  She was started on Eliquis and underwent TEE/cardioversion on 05/01/2022.  Patient was seen in the office by Dr. Mariah Olson on 11/26/2022 for routine follow-up.  She was maintaining sinus rhythm at  that time.  She reported periodic hypotension during and after dialysis.  After dialysis she will occasionally experience hypertension if she holds medications.  It was recommended she hold amlodipine prior to dialysis and take it at the end of her session.     History of Present Illness    Janet Olson is followed by Dr. Mariah Olson for the above outlined history.   Patient was last seen in the office by Dr. Mariah Olson on 06/26/2023 for routine follow-up.  She reported continued hypotension during dialysis causing dialysis to be stopped early.  She also reported symptomatic orthostasis at those times.  She reported if she holds metoprolol or amlodipine before dialysis she will become hypertensive and develop a headache.  She also did not feel holding medications helped hypotension.  EKG at the time of her visit showed sinus tachycardia.  It was recommended she add midodrine 5 to 10 mg 1 hour before dialysis.  Patient contacted the office on 11/12/2023 with reports of continued hypotension during dialysis.  Per triage RN: "Spoke with patient and she stated that she takes the midodrine (PROAMATINE) 10 MG tablet 1 hour before dialysis as recommended by Dr. Mariah Olson however the last couple times at dialysis she has been having syncopal episodes related to her BP dropping to low 100s systolic. Patient stated that Janet Haagensen, MD recommends she have an echocardiogram as her last one was 2021."  Dr. Mariah Olson ordered echo but it does not appear to have been scheduled.  Today, patient is accompanied by her son. She reports continued hypotension during dialysis despite holding amlodipine and lopressor  and taking midodrine 20 mg one hour prior. SBP can drop into the low 80s. Patient is frustrated because she will have to come off the machine early if BP gets too low then she feels like she is holding onto to too much fluid. She states if SBP is >150 she has a great session and BP will be in the normal range after. She is  recovering from a recent URI for which she was taking mucinex, which caused her to hold onto more fluid. She had dialysis today and will have another session tomorrow to hopefully get the last of the fluid off. She reports her breathing has not been too bad since getting over the upper respiratory infection. She has lower extremity edema that is improving. She denies chest pain, pressure or tightness. No palpitations.       ROS: All other systems reviewed and are otherwise negative except as noted in History of Present Illness.  Studies Reviewed    EKG Interpretation Date/Time:  Friday December 20 2023 14:42:03 EST Ventricular Rate:  93 PR Interval:  188 QRS Duration:  86 QT Interval:  400 QTC Calculation: 497 R Axis:   89  Text Interpretation: Normal sinus rhythm with sinus arrhythmia Possible Left atrial enlargement Low voltage QRS Cannot rule out Anterior infarct , age undetermined ST & T wave abnormality, consider lateral ischemia When compared with ECG of 26-Jun-2023 15:23, No significant change Confirmed by Janet Olson [phone]) on 12/20/2023 2:49:58 PM    Risk Assessment/Calculations     CHA2DS2-VASc Score = 3   This indicates a 3.2% annual risk of stroke. The patient's score is based upon: CHF History: 1 HTN History: 1 Diabetes History: 0 Stroke History: 0 Vascular Disease History: 0 Age Score: 0 Gender Score: 1             Physical Exam    VS:  BP 120/62 (BP Location: Left Arm, Patient Position: Sitting, Cuff Size: Large)   Pulse 93   Ht 5\' 9"  (1.753 m)   Wt 214 lb 8 oz (97.3 kg)   LMP 03/03/2018 (Approximate)   BMI 31.68 kg/m  , BMI Body mass index is 31.68 kg/m.  GEN: Well nourished, well developed, in no acute distress. Neck: No JVD or carotid bruits. Cardiac:  RRR. No murmurs. No rubs or gallops.   Respiratory:  Respirations regular and unlabored. Clear to auscultation without rales, wheezing or rhonchi. GI: Soft, nontender,  nondistended. Extremities: Radials/DP/PT 2+ and equal bilaterally. No clubbing or cyanosis. Mild non-pitting edema bilateral lower extremities.   Skin: Warm and dry, no rash. Neuro: Strength intact.  Assessment & Plan   PAF/a-flutter DCCV May 2023.  Patient denies palpitaitons.  Denies spontaneous bleeding concerns.  EKG shows NSR with sinus arrhythmia, 93 bpm.  -Continue metoprolol, Eliquis.  Chronic HFmrEF TEE May 2023 showed EF 40 to 45%, mildly reduced RV function, mild RVH, moderate BAE, mild to moderate MR, moderate TR.  Patient reports more dyspnea with recent URI that is improving and "it's not too bad." She has mild, nonpitting lower extremity edema. Breath sounds are diminished bilaterally without wheezing, rhonchi or rales.  GDMT limited secondary to ESRD. -Will update echo as ordered by Dr. Mariah Olson.  -Continue metoprolol, Lasix. -Volume managed by nephrology.  Hypertension/Hypotension during hemodialysis.  BP today 120/62. Patient having hypotension during dialysis despite holding metoprolol and amlodipine and taking midodrine 20 mg 1 hour prior to dialysis. SBP can drop into the low 80s and she has to stop dialysis.  -  Continue to hold amlodipine and metoprolol the morning of dialysis days.  -Increase midodrine to 30 mg 1 hour before dialysis.  -Take evening dose of metoprolol on dialysis days if SBP >100.  -Continue amlodipine and metoprolol on non-dialysis days.   Disposition: Increase midodrine to 30 mg 1 hour before dialysis. Echo as ordered by Dr. Mariah Olson. Return in 3 months or sooner as needed.          Signed, Etta Grandchild. Kawena Lyday, DNP, NP-C

## 2023-12-20 ENCOUNTER — Encounter: Payer: Self-pay | Admitting: Student

## 2023-12-20 ENCOUNTER — Ambulatory Visit: Payer: 59 | Attending: Student | Admitting: Student

## 2023-12-20 VITALS — BP 120/62 | HR 93 | Ht 69.0 in | Wt 214.5 lb

## 2023-12-20 DIAGNOSIS — I480 Paroxysmal atrial fibrillation: Secondary | ICD-10-CM | POA: Diagnosis not present

## 2023-12-20 DIAGNOSIS — I953 Hypotension of hemodialysis: Secondary | ICD-10-CM

## 2023-12-20 DIAGNOSIS — I5022 Chronic systolic (congestive) heart failure: Secondary | ICD-10-CM | POA: Diagnosis not present

## 2023-12-20 DIAGNOSIS — I10 Essential (primary) hypertension: Secondary | ICD-10-CM | POA: Diagnosis not present

## 2023-12-20 MED ORDER — MIDODRINE HCL 10 MG PO TABS: 30.0000 mg | ORAL_TABLET | ORAL | 3 refills | Status: DC

## 2023-12-20 NOTE — Patient Instructions (Signed)
Medication Instructions:  Your physician recommends the following medication changes.  HOLD: Amlodipine and Lopressor prior to hemodialysis -- if your systolic pressure is greater than 100 then you can take your second Lopressor   INCREASE: Midodrine 30 mg one (1) hour prior to hemodialysis    *If you need a refill on your cardiac medications before your next appointment, please call your pharmacy*   Lab Work: None ordered at this time  If you have labs (blood work) drawn today and your tests are completely normal, you will receive your results only by: MyChart Message (if you have MyChart) OR A paper copy in the mail If you have any lab test that is abnormal or we need to change your treatment, we will call you to review the results.   Testing/Procedures: Your physician has requested that you have an echocardiogram - schedule that today. Echocardiography is a painless test that uses sound waves to create images of your heart. It provides your doctor with information about the size and shape of your heart and how well your heart's chambers and valves are working.   You may receive an ultrasound enhancing agent through an IV if needed to better visualize your heart during the echo. This procedure takes approximately one hour.  There are no restrictions for this procedure.  This will take place at [address] [facility]) [address], [place] [address]  Please note: We ask at that you not bring children with you during ultrasound (echo/ vascular) testing. Due to room size and safety concerns, children are not allowed in the ultrasound rooms during exams. Our front office staff cannot provide observation of children in our lobby area while testing is being conducted. An adult accompanying a patient to their appointment will only be allowed in the ultrasound room at the discretion of the ultrasound technician under special circumstances. We apologize for any  inconvenience.    Follow-Up: At [facility], you and your health needs are our priority.  As part of our continuing mission to provide you with exceptional heart care, we have created designated Provider Care Teams.  These Care Teams include your primary Cardiologist (physician) and Advanced Practice Providers (APPs -  Physician Assistants and Nurse Practitioners) who all work together to provide you with the care you need, when you need it.  We recommend signing up for the patient portal called "MyChart".  Sign up information is provided on this After Visit Summary.  MyChart is used to connect with patients for Virtual Visits (Telemedicine).  Patients are able to view lab/test results, encounter notes, upcoming appointments, etc.  Non-urgent messages can be sent to your provider as well.   To learn more about what you can do with MyChart, go to [email or web address].    Your next appointment:   3 month(s)  Provider:   You may see Julien Nordmann, MD or one of the following Advanced Practice Providers on your designated Care Team:   Nicolasa Ducking, NP Eula Listen, PA-C Cadence Fransico Michael, PA-C Charlsie Quest, NP Carlos Levering, NP

## 2023-12-24 ENCOUNTER — Ambulatory Visit: Payer: 59 | Admitting: Cardiovascular Disease

## 2024-01-08 ENCOUNTER — Other Ambulatory Visit (INDEPENDENT_AMBULATORY_CARE_PROVIDER_SITE_OTHER): Payer: Self-pay | Admitting: Nurse Practitioner

## 2024-01-08 DIAGNOSIS — N186 End stage renal disease: Secondary | ICD-10-CM

## 2024-01-08 NOTE — Progress Notes (Deleted)
 MRN : [id number]  Janet Olson is a 54 y.o. (1970/10/08) female who presents with chief complaint of check access.  History of Present Illness:  The patient returns to the office for followup of their dialysis access.   The patient reports the function of the access has been stable. Patient denies difficulty with cannulation. The patient denies increased bleeding time after removing the needles. The patient denies hand pain or other symptoms consistent with steal phenomena.  No significant arm swelling.  The patient denies any complaints from the dialysis center or their nephrologist.  The patient denies redness or swelling at the access site. The patient denies fever or chills at home or while on dialysis.  No recent shortening of the patient's walking distance or new symptoms consistent with claudication.  No history of rest pain symptoms. No new ulcers or wounds of the lower extremities have occurred.  The patient denies amaurosis fugax or recent TIA symptoms. There are no recent neurological changes noted. There is no history of DVT, PE or superficial thrombophlebitis. No recent episodes of angina or shortness of breath documented.   Duplex ultrasound of the AV access shows a patent access.  The previously noted stenosis is not significantly changed compared to last study.  Flow volume today is *** cc/min (previous flow volume was *** cc/min)     No outpatient medications have been marked as taking for the 01/09/24 encounter (Appointment) with Jama, Cordella MATSU, MD.    Past Medical History:  Diagnosis Date   (HFpEF) heart failure with preserved ejection fraction (HCC)    a. 2015 Echo: Nl EF. Mild to mod LVH; b. 04/2017 Echo: EF 55-60% Gr1 DD; c. 12/2018 Echo: EF 55-60%; d. 02/2020 Echo: EF 50-55%; e. 10/2020 Echo: EF 55-60%, mild LVH, mildly enlarged RV, sev elev RVSP, BAE, mild MR; 12/2020 Echo: EF 65%, sev dil RA, dil LA, RVSP 29.34mmHg, MR/PR.   Abdominal  ascites    Acute hypoxemic respiratory failure (HCC) 01/03/2019   Acute renal failure (ARF) (HCC) 05/17/2017   Acute respiratory failure (HCC) 09/25/2018   Anemia    Anxiety    Arthritis    Asthma    Atrial flutter (HCC)    a. Dx 03/2022-->CHA2DS2VASc = 3-->eliquis .   Cardiomegaly    COPD (chronic obstructive pulmonary disease) (HCC)    Depression    Dialysis patient (HCC)    MON, WED , FRI   Dyspnea    ESRD (end stage renal disease) (HCC)    GERD (gastroesophageal reflux disease)    Headache    Migraines   Hypertension    Nonrheumatic tricuspid (valve) insufficiency    Pericardial effusion 01/26/2019   Respiratory failure with hypoxia (HCC) 04/21/2018   Ulcer    gastric   Vitamin D  deficiency     Past Surgical History:  Procedure Laterality Date   A/V FISTULAGRAM Left 04/17/2018   Procedure: A/V FISTULAGRAM;  Surgeon: Marea Selinda RAMAN, MD;  Location: [facility];  Service: Cardiovascular;  Laterality: Left;   A/V FISTULAGRAM Left 05/27/2018   Procedure: A/V FISTULAGRAM;  Surgeon: Jama Cordella MATSU, MD;  Location: [facility];  Service: Cardiovascular;  Laterality: Left;   A/V FISTULAGRAM Left 07/05/2020   Procedure: A/V FISTULAGRAM;  Surgeon: Jama Cordella MATSU, MD;  Location: [facility];  Service: Cardiovascular;  Laterality: Left;   A/V FISTULAGRAM Left 06/26/2022  Procedure: A/V Fistulagram;  Surgeon: Jama Cordella MATSU, MD;  Location: [facility];  Service: Cardiovascular;  Laterality: Left;   AV FISTULA PLACEMENT Left 08/08/2017   Procedure: ARTERIOVENOUS (AV) FISTULA CREATION ( BRACHIOCEPHALIC );  Surgeon: Marea Selinda RAMAN, MD;  Location: [facility];  Service: Vascular;  Laterality: Left;   AV FISTULA PLACEMENT Left 08/05/2020   Procedure: INSERTION OF ARTERIOVENOUS (AV) GRAFT ARM(BRACHIAL AXILLARY);  Surgeon: Jama Cordella MATSU, MD;  Location: [facility];  Service: Vascular;  Laterality: Left;   CARDIOVERSION N/A 05/01/2022   Procedure:  CARDIOVERSION;  Surgeon: Perla Evalene PARAS, MD;  Location: [facility];  Service: Cardiovascular;  Laterality: N/A;   CESAREAN SECTION     x 4 (1988, 1991, 1997, 2000 )   COLONOSCOPY WITH PROPOFOL  N/A 04/24/2018   Procedure: COLONOSCOPY WITH PROPOFOL ;  Surgeon: Unk Corinn Skiff, MD;  Location: [facility] ENDOSCOPY;  Service: Gastroenterology;  Laterality: N/A;   COLONOSCOPY WITH PROPOFOL  N/A 12/06/2022   Procedure: COLONOSCOPY WITH PROPOFOL ;  Surgeon: Unk Corinn Skiff, MD;  Location: [facility] ENDOSCOPY;  Service: Gastroenterology;  Laterality: N/A;   DIALYSIS/PERMA CATHETER INSERTION N/A 07/16/2017   Procedure: Dialysis/Perma Catheter Insertion;  Surgeon: Jama Cordella MATSU, MD;  Location: [facility];  Service: Cardiovascular;  Laterality: N/A;   DIALYSIS/PERMA CATHETER REMOVAL N/A 10/29/2017   Procedure: DIALYSIS/PERMA CATHETER REMOVAL;  Surgeon: Jama Cordella MATSU, MD;  Location: [facility];  Service: Cardiovascular;  Laterality: N/A;   DIALYSIS/PERMA CATHETER REMOVAL N/A 11/03/2020   Procedure: DIALYSIS/PERMA CATHETER REMOVAL;  Surgeon: Marea Selinda RAMAN, MD;  Location: [facility];  Service: Cardiovascular;  Laterality: N/A;   ESOPHAGOGASTRODUODENOSCOPY (EGD) WITH PROPOFOL  N/A 12/06/2022   Procedure: ESOPHAGOGASTRODUODENOSCOPY (EGD) WITH PROPOFOL ;  Surgeon: Unk Corinn Skiff, MD;  Location: [facility]OS[facility];  Service: Gastroenterology;  Laterality: N/A;   excision of lymph nodes Bilateral 2014   bilateral under arms.   PARATHYROID  IMPLANT REMOVAL     REMOVAL OF A DIALYSIS CATHETER Left 08/05/2020   Procedure: REMOVAL OF A DIALYSIS CATHETER ( FISTULA );  Surgeon: Jama Cordella MATSU, MD;  Location: [facility];  Service: Vascular;  Laterality: Left;   SHOULDER ARTHROSCOPY WITH SUBACROMIAL DECOMPRESSION AND OPEN ROTATOR C Right 01/08/2022   Procedure: Right shoulder arthroscopic subscapularis repair, rotator cuff repair, subacromial decompression, and distal clavicle excision;  Surgeon:  Tobie Priest, MD;  Location: [facility];  Service: Orthopedics;  Laterality: Right;   TEE WITHOUT CARDIOVERSION N/A 05/01/2022   Procedure: TRANSESOPHAGEAL ECHOCARDIOGRAM (TEE);  Surgeon: Perla Evalene PARAS, MD;  Location: [facility];  Service: Cardiovascular;  Laterality: N/A;    Social History Social History   Tobacco Use   Smoking status: Every Day    Current packs/day: 0.50    Average packs/day: 0.5 packs/day for 30.0 years (15.0 ttl pk-yrs)    Types: Cigarettes   Smokeless tobacco: Never  Vaping Use   Vaping status: Never Used  Substance Use Topics   Alcohol use: No   Drug use: No    Family History Family History  Problem Relation Age of Onset   Diabetes Mother     No Known Allergies   REVIEW OF SYSTEMS (Negative unless checked)  Constitutional: [] Weight loss  [] Fever  [] Chills Cardiac: [] Chest pain   [] Chest pressure   [] Palpitations   [] Shortness of breath when laying flat   [] Shortness of breath with exertion. Vascular:  [] Pain in legs with walking   [] Pain in legs at rest  [] History of DVT   [] Phlebitis   [] Swelling in  legs   [] Varicose veins   [] Non-healing ulcers Pulmonary:   [] Uses home oxygen   [] Productive cough   [] Hemoptysis   [] Wheeze  [x] COPD   [] Asthma Neurologic:  [] Dizziness   [] Seizures   [] History of stroke   [] History of TIA  [] Aphasia   [] Vissual changes   [] Weakness or numbness in arm   [] Weakness or numbness in leg Musculoskeletal:   [] Joint swelling   [] Joint pain   [] Low back pain Hematologic:  [] Easy bruising  [] Easy bleeding   [] Hypercoagulable state   [] Anemic Gastrointestinal:  [] Diarrhea   [] Vomiting  [x] Gastroesophageal reflux/heartburn   [] Difficulty swallowing. Genitourinary:  [x] Chronic kidney disease   [] Difficult urination  [] Frequent urination   [] Blood in urine Skin:  [] Rashes   [] Ulcers  Psychological:  [x] History of anxiety   [x]  History of major depression.  Physical Examination  There were no vitals filed for this visit. There is  no height or weight on file to calculate BMI. Gen: WD/WN, NAD Head: [place]/AT, No temporalis wasting.  Ear/Nose/Throat: Hearing grossly intact, nares w/o erythema or drainage Eyes: PER, EOMI, sclera nonicteric.  Neck: Supple, no gross masses or lesions.  No JVD.  Pulmonary:  Good air movement, no audible wheezing, no use of accessory muscles.  Cardiac: RRR, precordium non-hyperdynamic. Vascular:   *** Vessel Right Left  Radial Palpable Palpable  Brachial Palpable Palpable  Gastrointestinal: soft, non-distended. No guarding/no peritoneal signs.  Musculoskeletal: M/S 5/5 throughout.  No deformity.  Neurologic: CN 2-12 intact. Pain and light touch intact in extremities.  Symmetrical.  Speech is fluent. Motor exam as listed above. Psychiatric: Judgment intact, Mood & affect appropriate for pt's clinical situation. Dermatologic: No rashes or ulcers noted.  No changes consistent with cellulitis.   CBC Lab Results  Component Value Date   WBC 3.0 (L) 04/13/2022   HGB 10.2 (L) 12/06/2022   HCT 30.0 (L) 12/06/2022   MCV 84.7 04/13/2022   PLT 134 (L) 04/13/2022    BMET    Component Value Date/Time   NA 144 12/06/2022 0802   NA 139 09/23/2014 1741   K 3.4 (L) 12/06/2022 0802   K 4.2 09/23/2014 1741   CL 106 12/06/2022 0802   CL 112 (H) 09/23/2014 1741   CO2 25 04/13/2022 0554   CO2 21 09/23/2014 1741   GLUCOSE 72 12/06/2022 0802   GLUCOSE 82 09/23/2014 1741   BUN 18 12/06/2022 0802   BUN 18 09/23/2014 1741   CREATININE 7.00 (H) 12/06/2022 0802   CREATININE 1.41 (H) 09/23/2014 1741   CALCIUM  8.7 (L) 04/13/2022 0554   CALCIUM  8.4 (L) 01/03/2019 0243   GFRNONAA 4 (L) 04/13/2022 0554   GFRNONAA 43 (L) 09/23/2014 1741   GFRNONAA 43 (L) 11/20/2012 1129   GFRAA 3 (L) 08/08/2020 1303   GFRAA 52 (L) 09/23/2014 1741   GFRAA 49 (L) 11/20/2012 1129   CrCl cannot be calculated (Patient's most recent lab result is older than the maximum 21 days allowed.).  COAG Lab Results  Component  Value Date   INR 1.2 01/08/2022   INR 1.4 (H) 11/03/2020   INR 1.5 (H) 11/02/2020    Radiology No results found.   Assessment/Plan There are no diagnoses linked to this encounter.   Cordella Shawl, MD  01/08/2024 7:50 AM

## 2024-01-09 ENCOUNTER — Encounter (INDEPENDENT_AMBULATORY_CARE_PROVIDER_SITE_OTHER): Payer: 59

## 2024-01-09 ENCOUNTER — Ambulatory Visit (INDEPENDENT_AMBULATORY_CARE_PROVIDER_SITE_OTHER): Payer: 59 | Admitting: Vascular Surgery

## 2024-01-09 DIAGNOSIS — I10 Essential (primary) hypertension: Secondary | ICD-10-CM

## 2024-01-09 DIAGNOSIS — E782 Mixed hyperlipidemia: Secondary | ICD-10-CM

## 2024-01-09 DIAGNOSIS — J449 Chronic obstructive pulmonary disease, unspecified: Secondary | ICD-10-CM

## 2024-01-09 DIAGNOSIS — N186 End stage renal disease: Secondary | ICD-10-CM

## 2024-01-14 ENCOUNTER — Encounter (INDEPENDENT_AMBULATORY_CARE_PROVIDER_SITE_OTHER): Payer: Self-pay | Admitting: Vascular Surgery

## 2024-01-16 ENCOUNTER — Other Ambulatory Visit: Payer: 59

## 2024-01-20 ENCOUNTER — Other Ambulatory Visit: Payer: Self-pay | Admitting: Nephrology

## 2024-01-20 DIAGNOSIS — R0689 Other abnormalities of breathing: Secondary | ICD-10-CM

## 2024-01-22 ENCOUNTER — Other Ambulatory Visit: Payer: Self-pay

## 2024-01-22 ENCOUNTER — Emergency Department: Payer: 59

## 2024-01-22 ENCOUNTER — Inpatient Hospital Stay
Admission: EM | Admit: 2024-01-22 | Discharge: 2024-01-26 | DRG: 190 | Disposition: A | Discharge status: Home / Self Care | Payer: 59 | Attending: Internal Medicine | Admitting: Internal Medicine

## 2024-01-22 ENCOUNTER — Encounter: Payer: Self-pay | Admitting: Internal Medicine

## 2024-01-22 DIAGNOSIS — F17200 Nicotine dependence, unspecified, uncomplicated: Secondary | ICD-10-CM | POA: Diagnosis present

## 2024-01-22 DIAGNOSIS — F17210 Nicotine dependence, cigarettes, uncomplicated: Secondary | ICD-10-CM | POA: Diagnosis present

## 2024-01-22 DIAGNOSIS — E669 Obesity, unspecified: Secondary | ICD-10-CM | POA: Diagnosis present

## 2024-01-22 DIAGNOSIS — I132 Hypertensive heart and chronic kidney disease with heart failure and with stage 5 chronic kidney disease, or end stage renal disease: Secondary | ICD-10-CM | POA: Diagnosis present

## 2024-01-22 DIAGNOSIS — I5032 Chronic diastolic (congestive) heart failure: Secondary | ICD-10-CM | POA: Diagnosis present

## 2024-01-22 DIAGNOSIS — E875 Hyperkalemia: Secondary | ICD-10-CM | POA: Diagnosis not present

## 2024-01-22 DIAGNOSIS — R7989 Other specified abnormal findings of blood chemistry: Secondary | ICD-10-CM

## 2024-01-22 DIAGNOSIS — N186 End stage renal disease: Secondary | ICD-10-CM

## 2024-01-22 DIAGNOSIS — Z992 Dependence on renal dialysis: Secondary | ICD-10-CM

## 2024-01-22 DIAGNOSIS — E1122 Type 2 diabetes mellitus with diabetic chronic kidney disease: Secondary | ICD-10-CM | POA: Diagnosis present

## 2024-01-22 DIAGNOSIS — Z6833 Body mass index (BMI) 33.0-33.9, adult: Secondary | ICD-10-CM

## 2024-01-22 DIAGNOSIS — K219 Gastro-esophageal reflux disease without esophagitis: Secondary | ICD-10-CM | POA: Diagnosis present

## 2024-01-22 DIAGNOSIS — Z79899 Other long term (current) drug therapy: Secondary | ICD-10-CM

## 2024-01-22 DIAGNOSIS — B974 Respiratory syncytial virus as the cause of diseases classified elsewhere: Secondary | ICD-10-CM | POA: Diagnosis present

## 2024-01-22 DIAGNOSIS — D631 Anemia in chronic kidney disease: Secondary | ICD-10-CM | POA: Diagnosis present

## 2024-01-22 DIAGNOSIS — J441 Chronic obstructive pulmonary disease with (acute) exacerbation: Secondary | ICD-10-CM | POA: Diagnosis present

## 2024-01-22 DIAGNOSIS — R059 Cough, unspecified: Secondary | ICD-10-CM

## 2024-01-22 DIAGNOSIS — Z7901 Long term (current) use of anticoagulants: Secondary | ICD-10-CM | POA: Diagnosis not present

## 2024-01-22 DIAGNOSIS — R0609 Other forms of dyspnea: Secondary | ICD-10-CM | POA: Diagnosis present

## 2024-01-22 DIAGNOSIS — Z720 Tobacco use: Secondary | ICD-10-CM | POA: Diagnosis present

## 2024-01-22 DIAGNOSIS — J9601 Acute respiratory failure with hypoxia: Secondary | ICD-10-CM | POA: Diagnosis present

## 2024-01-22 DIAGNOSIS — J121 Respiratory syncytial virus pneumonia: Secondary | ICD-10-CM | POA: Diagnosis not present

## 2024-01-22 DIAGNOSIS — N2581 Secondary hyperparathyroidism of renal origin: Secondary | ICD-10-CM | POA: Diagnosis present

## 2024-01-22 DIAGNOSIS — Z1152 Encounter for screening for COVID-19: Secondary | ICD-10-CM

## 2024-01-22 DIAGNOSIS — Z833 Family history of diabetes mellitus: Secondary | ICD-10-CM | POA: Diagnosis not present

## 2024-01-22 DIAGNOSIS — R0902 Hypoxemia: Secondary | ICD-10-CM

## 2024-01-22 DIAGNOSIS — Z7951 Long term (current) use of inhaled steroids: Secondary | ICD-10-CM | POA: Diagnosis not present

## 2024-01-22 DIAGNOSIS — I2489 Other forms of acute ischemic heart disease: Secondary | ICD-10-CM | POA: Diagnosis present

## 2024-01-22 LAB — CBC WITH DIFFERENTIAL/PLATELET
Abs Immature Granulocytes: 0.03 10*3/uL (ref 0.00–0.07)
Basophils Absolute: 0.1 10*3/uL (ref 0.0–0.1)
Basophils Relative: 1 %
Eosinophils Absolute: 0.1 10*3/uL (ref 0.0–0.5)
Eosinophils Relative: 3 %
HCT: 32 % — ABNORMAL LOW (ref 36.0–46.0)
Hemoglobin: 10.1 g/dL — ABNORMAL LOW (ref 12.0–15.0)
Immature Granulocytes: 1 %
Lymphocytes Relative: 18 %
Lymphs Abs: 1 10*3/uL (ref 0.7–4.0)
MCH: 30.1 pg (ref 26.0–34.0)
MCHC: 31.6 g/dL (ref 30.0–36.0)
MCV: 95.5 fL (ref 80.0–100.0)
Monocytes Absolute: 0.9 10*3/uL (ref 0.1–1.0)
Monocytes Relative: 16 %
Neutro Abs: 3.4 10*3/uL (ref 1.7–7.7)
Neutrophils Relative %: 61 %
Platelets: 174 10*3/uL (ref 150–400)
RBC: 3.35 MIL/uL — ABNORMAL LOW (ref 3.87–5.11)
RDW: 18.2 % — ABNORMAL HIGH (ref 11.5–15.5)
WBC: 5.5 10*3/uL (ref 4.0–10.5)
nRBC: 0.5 % — ABNORMAL HIGH (ref 0.0–0.2)

## 2024-01-22 LAB — COMPREHENSIVE METABOLIC PANEL
ALT: 21 U/L (ref 0–44)
AST: 23 U/L (ref 15–41)
Albumin: 3.6 g/dL (ref 3.5–5.0)
Alkaline Phosphatase: 175 U/L — ABNORMAL HIGH (ref 38–126)
Anion gap: 15 (ref 5–15)
BUN: 62 mg/dL — ABNORMAL HIGH (ref 6–20)
CO2: 26 mmol/L (ref 22–32)
Calcium: 8.3 mg/dL — ABNORMAL LOW (ref 8.9–10.3)
Chloride: 99 mmol/L (ref 98–111)
Creatinine, Ser: 11.73 mg/dL — ABNORMAL HIGH (ref 0.44–1.00)
GFR, Estimated: 3 mL/min — ABNORMAL LOW (ref >60)
Glucose, Bld: 103 mg/dL — ABNORMAL HIGH (ref 70–99)
Potassium: 4.6 mmol/L (ref 3.5–5.1)
Sodium: 140 mmol/L (ref 135–145)
Total Bilirubin: 1.3 mg/dL — ABNORMAL HIGH (ref 0.0–1.2)
Total Protein: 7.1 g/dL (ref 6.5–8.1)

## 2024-01-22 LAB — PROTIME-INR
INR: 1.5 — ABNORMAL HIGH (ref 0.8–1.2)
Prothrombin Time: 18.2 s — ABNORMAL HIGH (ref 11.4–15.2)

## 2024-01-22 LAB — TROPONIN I (HIGH SENSITIVITY)
Troponin I (High Sensitivity): 18 ng/L — ABNORMAL HIGH (ref <18)
Troponin I (High Sensitivity): 16 ng/L (ref <18)

## 2024-01-22 LAB — RESP PANEL BY RT-PCR (RSV, FLU A&B, COVID)  RVPGX2
Influenza A by PCR: NEGATIVE
Influenza B by PCR: NEGATIVE
Resp Syncytial Virus by PCR: POSITIVE — AB
SARS Coronavirus 2 by RT PCR: NEGATIVE

## 2024-01-22 LAB — APTT: aPTT: 36 s (ref 24–36)

## 2024-01-22 LAB — LACTIC ACID, PLASMA
Lactic Acid, Venous: 1.4 mmol/L (ref 0.5–1.9)
Lactic Acid, Venous: 1.2 mmol/L (ref 0.5–1.9)

## 2024-01-22 LAB — PROCALCITONIN: Procalcitonin: 1.28 ng/mL

## 2024-01-22 MED ORDER — IPRATROPIUM-ALBUTEROL 0.5-2.5 (3) MG/3ML IN SOLN
6.0000 mL | Freq: Once | RESPIRATORY_TRACT | Status: AC
  Administered 2024-01-22: 6 mL via RESPIRATORY_TRACT
  Filled 2024-01-22: qty 6

## 2024-01-22 MED ORDER — GUAIFENESIN 100 MG/5ML PO LIQD
5.0000 mL | Freq: Four times a day (QID) | ORAL | Status: AC | PRN
  Administered 2024-01-23 – 2024-01-24 (×4): 5 mL via ORAL
  Filled 2024-01-22 (×4): qty 10

## 2024-01-22 MED ORDER — ONDANSETRON HCL 4 MG PO TABS: 4.0000 mg | ORAL_TABLET | Freq: Four times a day (QID) | ORAL | Status: DC | PRN

## 2024-01-22 MED ORDER — METOPROLOL TARTRATE 50 MG PO TABS
50.0000 mg | ORAL_TABLET | Freq: Once | ORAL | Status: AC
  Administered 2024-01-22: 50 mg via ORAL
  Filled 2024-01-22: qty 1

## 2024-01-22 MED ORDER — ACETAMINOPHEN 325 MG PO TABS
650.0000 mg | ORAL_TABLET | Freq: Four times a day (QID) | ORAL | Status: AC | PRN
Start: 2024-01-22 — End: 2024-01-25
  Administered 2024-01-22 – 2024-01-24 (×5): 650 mg via ORAL
  Filled 2024-01-22 (×4): qty 2

## 2024-01-22 MED ORDER — ONDANSETRON HCL 4 MG/2ML IJ SOLN
4.0000 mg | Freq: Four times a day (QID) | INTRAMUSCULAR | Status: DC | PRN
  Administered 2024-01-26: 4 mg via INTRAVENOUS
  Filled 2024-01-22: qty 2

## 2024-01-22 MED ORDER — HYDROCOD POLI-CHLORPHE POLI ER 10-8 MG/5ML PO SUER
5.0000 mL | Freq: Every evening | ORAL | Status: DC | PRN
  Administered 2024-01-23: 5 mL via ORAL
  Filled 2024-01-22: qty 5

## 2024-01-22 MED ORDER — HYDROXYZINE HCL 10 MG PO TABS: 10.0000 mg | ORAL_TABLET | Freq: Two times a day (BID) | ORAL | Status: DC | PRN

## 2024-01-22 MED ORDER — METHYLPREDNISOLONE SODIUM SUCC 40 MG IJ SOLR
40.0000 mg | Freq: Every day | INTRAMUSCULAR | Status: AC
  Administered 2024-01-23: 40 mg via INTRAVENOUS
  Filled 2024-01-22: qty 1

## 2024-01-22 MED ORDER — ACETAMINOPHEN 650 MG RE SUPP: 650.0000 mg | Freq: Four times a day (QID) | RECTAL | Status: AC | PRN

## 2024-01-22 MED ORDER — NICOTINE 21 MG/24HR TD PT24: 21.0000 mg | MEDICATED_PATCH | Freq: Every day | TRANSDERMAL | Status: DC | PRN

## 2024-01-22 MED ORDER — SENNOSIDES-DOCUSATE SODIUM 8.6-50 MG PO TABS
1.0000 | ORAL_TABLET | Freq: Every evening | ORAL | Status: DC | PRN
  Administered 2024-01-22: 1 via ORAL
  Filled 2024-01-22: qty 1

## 2024-01-22 MED ORDER — PREDNISONE 20 MG PO TABS
40.0000 mg | ORAL_TABLET | Freq: Every day | ORAL | Status: DC
  Administered 2024-01-24: 40 mg via ORAL
  Filled 2024-01-22: qty 2

## 2024-01-22 MED ORDER — TRAMADOL HCL 50 MG PO TABS
50.0000 mg | ORAL_TABLET | Freq: Once | ORAL | Status: AC
  Administered 2024-01-22: 50 mg via ORAL
  Filled 2024-01-22: qty 1

## 2024-01-22 MED ORDER — METHYLPREDNISOLONE SODIUM SUCC 125 MG IJ SOLR
125.0000 mg | Freq: Once | INTRAMUSCULAR | Status: AC
  Administered 2024-01-22: 125 mg via INTRAVENOUS
  Filled 2024-01-22: qty 2

## 2024-01-22 MED ORDER — IPRATROPIUM-ALBUTEROL 0.5-2.5 (3) MG/3ML IN SOLN
3.0000 mL | Freq: Four times a day (QID) | RESPIRATORY_TRACT | Status: AC
  Administered 2024-01-22 – 2024-01-23 (×3): 3 mL via RESPIRATORY_TRACT
  Filled 2024-01-22 (×3): qty 3

## 2024-01-22 MED ORDER — HEPARIN SODIUM (PORCINE) 5000 UNIT/ML IJ SOLN
5000.0000 [IU] | Freq: Three times a day (TID) | INTRAMUSCULAR | Status: DC
  Administered 2024-01-22 – 2024-01-23 (×2): 5000 [IU] via SUBCUTANEOUS
  Filled 2024-01-22 (×3): qty 1

## 2024-01-22 NOTE — Assessment & Plan Note (Signed)
Janet Olson has been consulted via Epic order and staff message

## 2024-01-22 NOTE — Assessment & Plan Note (Signed)
Secondary to RSV Duoneb QID, 4 doses ordered Solumedrol 40 mg daily ordered for 01/23/24 with prednisone PO on 01/24/24 Continuous pulse oximetry

## 2024-01-22 NOTE — Hospital Course (Addendum)
Ms. Angala Hilgers is a 54 year old female with history of hypertension, end-stage renal disease on hemodialysis, COPD, continued tobacco use, no prior oxygen supplementation, who presents emergency department for chief concerns of shortness of breath and wheezing. Her oxygen saturation has dropped down to 86%, she was placed on 3 L oxygen. Chest x-ray showed vascular congestion.  She was also positive for RSV. Patient was started on IV steroids and transition to oral prednisone for COPD exacerbation.   I had to increase steroid dose yesterday to control her bronchospasm.  Condition much improved today.  Patient medically stable for discharge, I will continue higher dose steroids at 40 mg prednisone twice a day for 3 days.  Follow-up with PCP as outpatient.

## 2024-01-22 NOTE — ED Triage Notes (Signed)
Pt to ED via [facility] for c/o sob, wheezing. Pt had three breathing treatments prior to EMS arrival, 91% on room air. Pt given 125 solu-medrol and a duo neb. Dialysis pt with treatments on M, W, F, did not have treatment today.

## 2024-01-22 NOTE — Assessment & Plan Note (Signed)
Patient denies chest pain multiple times at bedside Suspect secondary to demand ischemia in setting of acute hypoxic respiratory failure due to COPD exacerbation in setting of RSV infection Will continue to monitor second high sensitive troponin

## 2024-01-22 NOTE — Assessment & Plan Note (Signed)
Patient endorses readiness to stop tobacco use. Greater 5 minutes spent counseling tobacco cessation Week one, smoke 9 cigarettes per day. Week two, smoke 8 cigarettes per day. Week three, smoke 7 cigarettes per day, continue until smoking half the amount of cigarettes per day Discuss with PCP for pharmacologic assistance with smoking cessation Clean all indoor clothing, sheets, blankets, and freshen textile furniture to rid the smell of cigarettes Only smoke outside and wear outer covering Leave cigarettes and lighters outside in separate places During in-between cigarettes, if you feel the urge to smoke, use the following: stress squeezing devices/phone a trusted friend to talk you through the urge/walk in a safe environment Avoid prolong interactions with individuals actively smoking cigarettes If you smoke in social setting, avoid social setting where cigarette smoking is expected or considered acceptable  If missing the feeling of holding a cigarette, cut a sipping straw to the length of a cigarette and hold it between your fingers Call 1800 QUIT NOW if in need of nicotine patches to help with cessation

## 2024-01-22 NOTE — H&P (Addendum)
History and Physical   [name] BJY:[id number] DOB: 01-12-1970 DOA: 01/22/2024  PCP: Armando Gang, FNP  Outpatient Specialists: Dr. Cherylann Ratel Patient coming from: Shortness of breath  I have personally briefly reviewed patient's old medical records in [facility] EMR.  Chief Concern: shortness of breath.  HPI: [name] is a 54 year old female with history of hypertension, end-stage renal disease on hemodialysis, COPD, continued tobacco use, no prior oxygen supplementation, who presents emergency department for chief concerns of shortness of breath and wheezing.  DuoNebs one-time dose, Solu-Medrol 125 mg given by EMS per ED nursing documentation.  Vitals in the ED showed temperature of 98, respiration rate 22, heart rate 97, blood pressure 131/80, SpO2 of 93% on room air, improved to 99% on 2 L nasal cannula.  Serum sodium is 140, potassium 4.6, chloride 99, bicarb 26, BUN 62, seen creatinine of 11.73, EGFR of 3, nonfasting blood glucose 103, WBC 5.5, hemoglobin 10.1, platelets of 174.  T. bili was 1.3.  Lactic acid 1.4.  ED treatment: DuoNebs one-time dose, Solu-Medrol 125 mg IV one-time dose.  At bedside, she is able to tell me her first and last name, age, current year, current location.   She developed shortness of breath on Monday. She did get HD full session. She was so short of breath on Wednesday that she could not make it to HD today.  She reports increase cough since last week and it is yellow sputum. She denies chest pain.  She denies known sick contacts though she goes to HD centers.  She makes minimal urine.  Social history: She lives with her son. She smokes 10 cigarettes per day. She denies etoh and recreational drug use. She is disabled.   ROS Constitutional: no weight change, no fever ENT/Mouth: no sore throat, no rhinorrhea Eyes: no eye pain, no vision changes Cardiovascular: no chest pain, + dyspnea,  no edema, no palpitations Respiratory:  + cough, + sputum, no wheezing Gastrointestinal: no nausea, no vomiting, no diarrhea, no constipation Genitourinary: no urinary incontinence, no dysuria, no hematuria Musculoskeletal: no arthralgias, no myalgias Skin: no skin lesions, no pruritus, Neuro: + weakness, no loss of consciousness, no syncope Psych: no anxiety, no depression, + decrease appetite Heme/Lymph: no bruising, no bleeding  ED Course: Discussed with EDP, patient requiring hospitalization for chief concerns of COPD exacerbation.  Assessment/Plan  Principal Problem:   COPD with acute exacerbation (HCC) Active Problems:   ESRD on dialysis (HCC)   RSV (respiratory syncytial virus pneumonia)   Elevated troponin   Cough   Tobacco use   Tobacco dependence syndrome   Dyspnea on exertion   Assessment and Plan:  * COPD with acute exacerbation (HCC) Secondary to RSV Duoneb QID, 4 doses ordered Solumedrol 40 mg daily ordered for 01/23/24 with prednisone PO on 01/24/24 Continuous pulse oximetry  ESRD on dialysis [facility]) Leeroy Bock has been consulted via Epic order and staff message  Elevated troponin Patient denies chest pain multiple times at bedside Suspect secondary to demand ischemia in setting of acute hypoxic respiratory failure due to COPD exacerbation in setting of RSV infection Will continue to monitor second high sensitive troponin  RSV (respiratory syncytial virus pneumonia) Present on admission  Cough Secondary to RSV Guaifenesin 5 mL p.o. every 6 hours as needed to loosen phlegm, 3 days ordered; Tussionex 5 mL p.o. nightly as needed for cough, 2 days ordered  Tobacco dependence syndrome Patient endorses readiness to stop tobacco use. Greater 5 minutes spent counseling tobacco cessation Week  one, smoke 9 cigarettes per day. Week two, smoke 8 cigarettes per day. Week three, smoke 7 cigarettes per day, continue until smoking half the amount of cigarettes per day Discuss with PCP for pharmacologic assistance  with smoking cessation Clean all indoor clothing, sheets, blankets, and freshen textile furniture to rid the smell of cigarettes Only smoke outside and wear outer covering Leave cigarettes and lighters outside in separate places During in-between cigarettes, if you feel the urge to smoke, use the following: stress squeezing devices/phone a trusted friend to talk you through the urge/walk in a safe environment Avoid prolong interactions with individuals actively smoking cigarettes If you smoke in social setting, avoid social setting where cigarette smoking is expected or considered acceptable  If missing the feeling of holding a cigarette, cut a sipping straw to the length of a cigarette and hold it between your fingers Call 1800 QUIT NOW if in need of nicotine patches to help with cessation  Chart reviewed.   DVT prophylaxis: Heparin 5000 units subcutaneous every 8 hours Code Status: Full code Diet: Renal diet Family Communication: a phone call was offered, patient declined saying she updated her son already. Disposition Plan: Pending clinical course Consults called: nephrology Admission status: Telemetry medical, inpatient  Past Medical History:  Diagnosis Date   (HFpEF) heart failure with preserved ejection fraction (HCC)    a. 2015 Echo: Nl EF. Mild to mod LVH; b. 04/2017 Echo: EF 55-60% Gr1 DD; c. 12/2018 Echo: EF 55-60%; d. 02/2020 Echo: EF 50-55%; e. 10/2020 Echo: EF 55-60%, mild LVH, mildly enlarged RV, sev elev RVSP, BAE, mild MR; 12/2020 Echo: EF 65%, sev dil RA, dil LA, RVSP 29.24mmHg, MR/PR.   Abdominal ascites    Acute hypoxemic respiratory failure (HCC) 01/03/2019   Acute renal failure (ARF) (HCC) 05/17/2017   Acute respiratory failure (HCC) 09/25/2018   Anemia    Anxiety    Arthritis    Asthma    Atrial flutter (HCC)    a. Dx 03/2022-->CHA2DS2VASc = 3-->eliquis.   Cardiomegaly    COPD (chronic obstructive pulmonary disease) (HCC)    Depression    Dialysis patient (HCC)     MON, WED , FRI   Dyspnea    ESRD (end stage renal disease) (HCC)    GERD (gastroesophageal reflux disease)    Headache    Migraines   Hypertension    Nonrheumatic tricuspid (valve) insufficiency    Pericardial effusion 01/26/2019   Respiratory failure with hypoxia (HCC) 04/21/2018   Ulcer    gastric   Vitamin D deficiency    Past Surgical History:  Procedure Laterality Date   A/V FISTULAGRAM Left 04/17/2018   Procedure: A/V FISTULAGRAM;  Surgeon: Annice Needy, MD;  Location: [facility];  Service: Cardiovascular;  Laterality: Left;   A/V FISTULAGRAM Left 05/27/2018   Procedure: A/V FISTULAGRAM;  Surgeon: Renford Dills, MD;  Location: [facility];  Service: Cardiovascular;  Laterality: Left;   A/V FISTULAGRAM Left 07/05/2020   Procedure: A/V FISTULAGRAM;  Surgeon: Renford Dills, MD;  Location: [facility];  Service: Cardiovascular;  Laterality: Left;   A/V FISTULAGRAM Left 06/26/2022   Procedure: A/V Fistulagram;  Surgeon: Renford Dills, MD;  Location: [facility];  Service: Cardiovascular;  Laterality: Left;   AV FISTULA PLACEMENT Left 08/08/2017   Procedure: ARTERIOVENOUS (AV) FISTULA CREATION ( BRACHIOCEPHALIC );  Surgeon: Annice Needy, MD;  Location: [facility];  Service: Vascular;  Laterality: Left;   AV FISTULA  PLACEMENT Left 08/05/2020   Procedure: INSERTION OF ARTERIOVENOUS (AV) GRAFT ARM(BRACHIAL AXILLARY);  Surgeon: Renford Dills, MD;  Location: [facility];  Service: Vascular;  Laterality: Left;   CARDIOVERSION N/A 05/01/2022   Procedure: CARDIOVERSION;  Surgeon: Antonieta Iba, MD;  Location: [facility];  Service: Cardiovascular;  Laterality: N/A;   CESAREAN SECTION     x 4 (1988, 1991, 1997, 2000 )   COLONOSCOPY WITH PROPOFOL N/A 04/24/2018   Procedure: COLONOSCOPY WITH PROPOFOL;  Surgeon: Toney Reil, MD;  Location: [facility] ENDOSCOPY;  Service: Gastroenterology;  Laterality: N/A;   COLONOSCOPY WITH PROPOFOL N/A 12/06/2022    Procedure: COLONOSCOPY WITH PROPOFOL;  Surgeon: Toney Reil, MD;  Location: [facility] ENDOSCOPY;  Service: Gastroenterology;  Laterality: N/A;   DIALYSIS/PERMA CATHETER INSERTION N/A 07/16/2017   Procedure: Dialysis/Perma Catheter Insertion;  Surgeon: Renford Dills, MD;  Location: [facility];  Service: Cardiovascular;  Laterality: N/A;   DIALYSIS/PERMA CATHETER REMOVAL N/A 10/29/2017   Procedure: DIALYSIS/PERMA CATHETER REMOVAL;  Surgeon: Renford Dills, MD;  Location: [facility];  Service: Cardiovascular;  Laterality: N/A;   DIALYSIS/PERMA CATHETER REMOVAL N/A 11/03/2020   Procedure: DIALYSIS/PERMA CATHETER REMOVAL;  Surgeon: Annice Needy, MD;  Location: [facility];  Service: Cardiovascular;  Laterality: N/A;   ESOPHAGOGASTRODUODENOSCOPY (EGD) WITH PROPOFOL N/A 12/06/2022   Procedure: ESOPHAGOGASTRODUODENOSCOPY (EGD) WITH PROPOFOL;  Surgeon: Toney Reil, MD;  Location: [facility] ENDOSCOPY;  Service: Gastroenterology;  Laterality: N/A;   excision of lymph nodes Bilateral 2014   bilateral under arms.   PARATHYROID IMPLANT REMOVAL     REMOVAL OF A DIALYSIS CATHETER Left 08/05/2020   Procedure: REMOVAL OF A DIALYSIS CATHETER ( FISTULA );  Surgeon: Renford Dills, MD;  Location: [facility];  Service: Vascular;  Laterality: Left;   SHOULDER ARTHROSCOPY WITH SUBACROMIAL DECOMPRESSION AND OPEN ROTATOR C Right 01/08/2022   Procedure: Right shoulder arthroscopic subscapularis repair, rotator cuff repair, subacromial decompression, and distal clavicle excision;  Surgeon: Signa Kell, MD;  Location: [facility] OR[facility];  Service: Orthopedics;  Laterality: Right;   TEE WITHOUT CARDIOVERSION N/A 05/01/2022   Procedure: TRANSESOPHAGEAL ECHOCARDIOGRAM (TEE);  Surgeon: Antonieta Iba, MD;  Location: [facility];  Service: Cardiovascular;  Laterality: N/A;   Social History:  reports that she has been smoking cigarettes. She has a 15 pack-year smoking history. She has never used  smokeless tobacco. She reports that she does not drink alcohol and does not use drugs.  No Known Allergies Family History  Problem Relation Age of Onset   Diabetes Mother    Family history: Family history reviewed and not pertinent.  Prior to Admission medications   Medication Sig Start Date End Date Taking? Authorizing Provider  albuterol (PROVENTIL) (2.5 MG/3ML) 0.083% nebulizer solution Take 2.5 mg by nebulization every 6 (six) hours as needed for wheezing or shortness of breath.     [provider]  albuterol (VENTOLIN HFA) 108 (90 Base) MCG/ACT inhaler Inhale 2 puffs into the lungs every 6 (six) hours as needed for wheezing or shortness of breath.  12/22/19   [provider]  amLODipine (NORVASC) 5 MG tablet Take 5 mg by mouth daily.    [provider]  AURYXIA 1 GM 210 MG(Fe) tablet Take 420 mg by mouth 3 (three) times daily. 09/21/22   [provider]  benzonatate (TESSALON) 200 MG capsule Take 400 mg by mouth 3 (three) times daily as needed for cough.    [provider]  BREZTRI AEROSPHERE 160-9-4.8 MCG/ACT  AERO SMARTSIG:2 Puff(s) By Mouth Twice Daily 10/29/22   [provider]  CAPLYTA 10.5 MG CAPS Take 1 capsule by mouth daily. 09/06/22   [provider]  Cholecalciferol 125 MCG (5000 UT) capsule Take by mouth. 03/07/22   [provider]  cinacalcet (SENSIPAR) 30 MG tablet Take 1 tablet (30 mg total) by mouth every Monday, Wednesday, and Friday. 04/13/22   Willeen Niece, MD  cyclobenzaprine (FLEXERIL) 5 MG tablet Take 5 mg by mouth 3 (three) times daily. 12/16/23   [provider]  DULERA 200-5 MCG/ACT AERO Inhale 2 puffs into the lungs 2 (two) times daily. Patient not taking: Reported on 06/26/2023 01/16/18   [provider]  DULoxetine (CYMBALTA) 60 MG capsule Take by mouth. Patient not taking: Reported on 12/20/2023    [provider]  ELIQUIS 5 MG TABS tablet TAKE 1 TABLET(5 MG) BY  MOUTH TWICE DAILY 05/03/23   Antonieta Iba, MD  furosemide (LASIX) 40 MG tablet Take 40 mg by mouth daily. 09/28/22   [provider]  Heparin Sodium, Porcine, (HEPARIN LOCK FLUSH IJ) Inject 1 Dose as directed every Monday, Wednesday, and Friday. At dialysis    [provider]  hydrOXYzine (ATARAX) 10 MG tablet Take 10 mg by mouth 2 (two) times daily as needed. 11/09/22   [provider]  hydrOXYzine (ATARAX) 25 MG tablet Take by mouth. Patient not taking: Reported on 12/20/2023 04/23/22   [provider]  lanthanum (FOSRENOL) 1000 MG chewable tablet Chew 1,000 mg by mouth 3 (three) times daily. 12/03/22   [provider]  lidocaine-prilocaine (EMLA) cream 1 application as needed (prior to port being accessed). 12/15/20   [provider]  linaclotide (LINZESS) 72 MCG capsule Take 72 mcg by mouth every evening. Patient not taking: Reported on 12/20/2023    [provider]  lumateperone tosylate (CAPLYTA) 42 MG capsule Take 42 mg by mouth at bedtime. Patient not taking: Reported on 12/20/2023    [provider]  metoprolol tartrate (LOPRESSOR) 100 MG tablet Take 50 mg by mouth 2 (two) times daily.    [provider]  midodrine (PROAMATINE) 10 MG tablet Take 3 tablets (30 mg total) by mouth See admin instructions. Take 3 tablet (30 mg total) by mouth an hour prior to dialysis. 12/20/23   Carlos Levering, NP  multivitamin (RENA-VIT) TABS tablet Take 1 tablet by mouth daily.  08/23/17   [provider]  pantoprazole (PROTONIX) 40 MG tablet Take 1 tablet (40 mg total) by mouth daily. 12/20/18   Jeanmarie Plant, MD  polyethylene glycol powder (GLYCOLAX/MIRALAX) 17 GM/SCOOP powder Take 255 g by mouth daily. Patient not taking: Reported on 12/20/2023 06/05/22   Toney Reil, MD  pregabalin (LYRICA) 25 MG capsule Take 75 mg by mouth daily.    [provider]  QUEtiapine (SEROQUEL) 100 MG tablet Take  100-200 mg by mouth at bedtime as needed (sleep). Patient not taking: Reported on 12/20/2023 11/24/21   [provider]  QUEtiapine (SEROQUEL) 25 MG tablet Take 50 mg by mouth at bedtime as needed (sleep). 05/24/20   [provider]  sodium chloride (OCEAN) 0.65 % SOLN nasal spray Place 1 spray into both nostrils as needed for congestion. 12/16/22   Pilar Jarvis, MD  VELPHORO 500 MG chewable tablet Chew 1,000 mg by mouth 3 (three) times daily. 11/20/22   [provider]  Vitamin D, Ergocalciferol, (DRISDOL) 1.25 MG (50000 UNIT) CAPS capsule Take 50,000 Units by mouth once a  week. 02/02/23   [provider]   Physical Exam: Vitals:   01/22/24 1630 01/22/24 1631 01/22/24 1715 01/22/24 1730  BP: 131/80  129/73 (!) 143/77  Pulse: 97  100 99  Resp: (!) 22  19 (!) 22  Temp: 98 F (36.7 C)     TempSrc: Oral     SpO2: 93%   99%  Weight:  93.9 kg    Height:  5\' 6"  (1.676 m)     Constitutional: appears older than chronological age, NAD, calm Eyes: PERRL, lids and conjunctivae normal ENMT: Mucous membranes are moist. Posterior pharynx clear of any exudate or lesions. Age-appropriate dentition. Hearing appropriate Neck: normal, supple, no masses, no thyromegaly Respiratory: clear to auscultation bilaterally, no wheezing, no crackles. Normal respiratory effort. No accessory muscle use.  Cardiovascular: Regular rate and rhythm, no murmurs / rubs / gallops. No extremity edema. 2+ pedal pulses. No carotid bruits.  Abdomen: Obese abdomen, no tenderness, no masses palpated, no hepatosplenomegaly. Bowel sounds positive.  Musculoskeletal: no clubbing / cyanosis. No joint deformity upper and lower extremities. Good ROM, no contractures, no atrophy. Normal muscle tone.  Skin: no rashes, lesions, ulcers. No induration Neurologic: Sensation intact. Strength 5/5 in all 4.  Psychiatric: Normal judgment and insight. Alert and oriented x 3. Normal mood.   EKG: independently  reviewed, showing sinus rhythm with a rate of 92, QTc 482  Chest x-ray on Admission: I personally reviewed and I agree with radiologist reading as below.  DG Chest Port 1 View Result Date: 01/22/2024 CLINICAL DATA:  CHF, COPD exacerbation, hypoxia EXAM: PORTABLE CHEST 1 VIEW COMPARISON:  04/11/2022 FINDINGS: Single frontal view of the chest demonstrates marked enlargement of the cardiac silhouette, stable. Chronic pulmonary vascular congestion without airspace disease, effusion, or pneumothorax. No acute bony abnormalities. IMPRESSION: 1. Stable enlarged cardiac silhouette. 2. Chronic pulmonary vascular congestion without overt edema. Electronically Signed   By: Sharlet Salina M.D.   On: 01/22/2024 17:15   Labs on Admission: I have personally reviewed following labs  CBC: Recent Labs  Lab 01/22/24 1633  WBC 5.5  NEUTROABS 3.4  HGB 10.1*  HCT 32.0*  MCV 95.5  PLT 174   Basic Metabolic Panel: Recent Labs  Lab 01/22/24 1633  NA 140  K 4.6  CL 99  CO2 26  GLUCOSE 103*  BUN 62*  CREATININE 11.73*  CALCIUM 8.3*   GFR: Estimated Creatinine Clearance: 6.3 mL/min (A) (by C-G formula based on SCr of 11.73 mg/dL (H)).  Liver Function Tests: Recent Labs  Lab 01/22/24 1633  AST 23  ALT 21  ALKPHOS 175*  BILITOT 1.3*  PROT 7.1  ALBUMIN 3.6   Coagulation Profile: Recent Labs  Lab 01/22/24 1633  INR 1.5*   Urine analysis:    Component Value Date/Time   COLORURINE YELLOW (A) 10/06/2021 0311   APPEARANCEUR CLOUDY (A) 10/06/2021 0311   LABSPEC 1.012 10/06/2021 0311   PHURINE 7.0 10/06/2021 0311   GLUCOSEU NEGATIVE 10/06/2021 0311   HGBUR NEGATIVE 10/06/2021 0311   BILIRUBINUR NEGATIVE 10/06/2021 0311   KETONESUR NEGATIVE 10/06/2021 0311   PROTEINUR >=300 (A) 10/06/2021 0311   NITRITE NEGATIVE 10/06/2021 0311   LEUKOCYTESUR TRACE (A) 10/06/2021 0311   This document was prepared using Dragon Voice Recognition software and may include unintentional dictation  errors.  Dr. Sedalia Muta [facility]  If 7PM-7AM, please contact overnight-coverage provider If 7AM-7PM, please contact day attending provider [email or web address]  01/22/2024, 8:02 PM

## 2024-01-22 NOTE — ED Provider Notes (Signed)
[facility] Provider Note    Event Date/Time   First MD Initiated Contact with Patient 01/22/24 1630     (approximate)   History   Shortness of Breath   HPI  [place] CITIZEN is a 54 y.o. female who presents to the ED for evaluation of Shortness of Breath   Review of medical DC summary from [facility] admission 1/8 - 1/10.  Hemodialysis patient admitted for respiratory failure, COPD/CHF/volume overload.  MWF ESRD.  Continued tobacco abuse.  Patient is from a perforation of worsening shortness of breath of the past day or 2.  Dry cough.  Went to dialysis on Monday and last week, but could not go today because of how badly she felt.   Physical Exam   Triage Vital Signs: ED Triage Vitals  Encounter Vitals Group     BP 01/22/24 1630 131/80     Systolic BP Percentile --      Diastolic BP Percentile --      Pulse Rate 01/22/24 1630 97     Resp 01/22/24 1630 (!) 22     Temp 01/22/24 1630 98 F (36.7 C)     Temp Source 01/22/24 1630 Oral     SpO2 01/22/24 1630 93 %     Weight 01/22/24 1631 207 lb (93.9 kg)     Height 01/22/24 1631 5\' 6"  (1.676 m)     Head Circumference --      Peak Flow --      Pain Score 01/22/24 1631 8     Pain Loc --      Pain Education --      Exclude from Growth Chart --     Most recent vital signs: Vitals:   01/22/24 1715 01/22/24 1730  BP: 129/73 (!) 143/77  Pulse: 100 99  Resp: 19 (!) 22  Temp:    SpO2:  99%    General: Awake, no distress.  CV:  Good peripheral perfusion.  Tachycardic and regular Resp:  Mild tachypnea to the mid 20s.  Wheezing throughout and slightly decreased airflow. Abd:  No distention.  MSK:  No deformity noted.  Trace pitting edema bilaterally Neuro:  No focal deficits appreciated. Other:     ED Results / Procedures / Treatments   Labs (all labs ordered are listed, but only abnormal results are displayed) Labs Reviewed  COMPREHENSIVE METABOLIC PANEL - Abnormal; Notable for the following  components:      Result Value   Glucose, Bld 103 (*)    BUN 62 (*)    Creatinine, Ser 11.73 (*)    Calcium 8.3 (*)    Alkaline Phosphatase 175 (*)    Total Bilirubin 1.3 (*)    GFR, Estimated 3 (*)    All other components within normal limits  CBC WITH DIFFERENTIAL/PLATELET - Abnormal; Notable for the following components:   RBC 3.35 (*)    Hemoglobin 10.1 (*)    HCT 32.0 (*)    RDW 18.2 (*)    nRBC 0.5 (*)    All other components within normal limits  PROTIME-INR - Abnormal; Notable for the following components:   Prothrombin Time 18.2 (*)    INR 1.5 (*)    All other components within normal limits  CULTURE, BLOOD (SINGLE)  RESP PANEL BY RT-PCR (RSV, FLU A&B, COVID)  [id number]  LACTIC ACID, PLASMA  APTT  LACTIC ACID, PLASMA  URINALYSIS, W/ REFLEX TO CULTURE (INFECTION SUSPECTED)  PROCALCITONIN    EKG Sinus rhythm with  a rate of 92 bpm.  Normal axis and intervals.  Nonspecific changes without STEMI.  RADIOLOGY CXR interpreted by me without evidence of acute cardiopulmonary pathology.  Official radiology report(s): DG Chest Port 1 View Result Date: 01/22/2024 CLINICAL DATA:  CHF, COPD exacerbation, hypoxia EXAM: PORTABLE CHEST 1 VIEW COMPARISON:  04/11/2022 FINDINGS: Single frontal view of the chest demonstrates marked enlargement of the cardiac silhouette, stable. Chronic pulmonary vascular congestion without airspace disease, effusion, or pneumothorax. No acute bony abnormalities. IMPRESSION: 1. Stable enlarged cardiac silhouette. 2. Chronic pulmonary vascular congestion without overt edema. Electronically Signed   By: Sharlet Salina M.D.   On: 01/22/2024 17:15    PROCEDURES and INTERVENTIONS:  .1-3 Lead EKG Interpretation  Performed by: Delton Prairie, MD Authorized by: Delton Prairie, MD     Interpretation: abnormal     ECG rate:  110   ECG rate assessment: tachycardic     Rhythm: sinus tachycardia     Ectopy: none     Conduction: normal   .Critical Care  Performed  by: Delton Prairie, MD Authorized by: Delton Prairie, MD   Critical care provider statement:    Critical care time (minutes):  30   Critical care time was exclusive of:  Separately billable procedures and treating other patients   Critical care was necessary to treat or prevent imminent or life-threatening deterioration of the following conditions:  Respiratory failure   Critical care was time spent personally by me on the following activities:  Development of treatment plan with patient or surrogate, discussions with consultants, evaluation of patient's response to treatment, examination of patient, ordering and review of laboratory studies, ordering and review of radiographic studies, ordering and performing treatments and interventions, pulse oximetry, re-evaluation of patient's condition and review of old charts   Medications  methylPREDNISolone sodium succinate (SOLU-MEDROL) 125 mg/2 mL injection 125 mg (125 mg Intravenous Given 01/22/24 1645)  ipratropium-albuterol (DUONEB) 0.5-2.5 (3) MG/3ML nebulizer solution 6 mL (6 mLs Nebulization Given 01/22/24 1645)     IMPRESSION / MDM / ASSESSMENT AND PLAN / ED COURSE  I reviewed the triage vital signs and the nursing notes.  Differential diagnosis includes, but is not limited to, ACS, PTX, PNA, muscle strain/spasm, PE, dissection, anxiety, pleural effusion  {Patient presents with symptoms of an acute illness or injury that is potentially life-threatening.  Patient presents with shortness of breath and evidence of COPD and CHF exacerbation/volume overload requiring medical admission.  She is hypoxic, which is new.  Tachypneic with wheezing and signs of COPD exacerbation.  I considered and offered BiPAP to the patient but she refuses and she is improving with nebulizers so I think this is reasonable.  No distress or tripoding.  Suspect mostly COPD, but she did miss dialysis today and looks slightly volume overloaded on exam.  No infiltrates on x-ray.   Drew 1 culture but no clear infectious etiology.  Negative WBC and lactic acid.  Procalcitonin pending.  Normal potassium.  Consult medicine for admission.  Clinical Course as of 01/22/24 1749  Wed Jan 22, 2024  1745 Reassessed and discussed recommendation for admission, COPD/CHF exacerbation.  She is agreeable. [DS]    Clinical Course User Index [DS] Delton Prairie, MD     FINAL CLINICAL IMPRESSION(S) / ED DIAGNOSES   Final diagnoses:  Acute respiratory failure with hypoxia (HCC)  COPD exacerbation (HCC)  Hypoxia     Rx / DC Orders   ED Discharge Orders     None  Note:  This document was prepared using Dragon voice recognition software and may include unintentional dictation errors.   Delton Prairie, MD 01/22/24 [phone]

## 2024-01-22 NOTE — Assessment & Plan Note (Addendum)
Secondary to RSV Guaifenesin 5 mL p.o. every 6 hours as needed to loosen phlegm, 3 days ordered; Tussionex 5 mL p.o. nightly as needed for cough, 2 days ordered

## 2024-01-22 NOTE — Assessment & Plan Note (Signed)
Present on admission

## 2024-01-23 ENCOUNTER — Encounter: Payer: Self-pay | Admitting: Internal Medicine

## 2024-01-23 DIAGNOSIS — J121 Respiratory syncytial virus pneumonia: Secondary | ICD-10-CM

## 2024-01-23 DIAGNOSIS — N186 End stage renal disease: Secondary | ICD-10-CM | POA: Diagnosis not present

## 2024-01-23 DIAGNOSIS — J441 Chronic obstructive pulmonary disease with (acute) exacerbation: Secondary | ICD-10-CM | POA: Diagnosis not present

## 2024-01-23 DIAGNOSIS — Z992 Dependence on renal dialysis: Secondary | ICD-10-CM | POA: Diagnosis not present

## 2024-01-23 LAB — BASIC METABOLIC PANEL
Anion gap: 18 — ABNORMAL HIGH (ref 5–15)
BUN: 77 mg/dL — ABNORMAL HIGH (ref 6–20)
CO2: 22 mmol/L (ref 22–32)
Calcium: 7.4 mg/dL — ABNORMAL LOW (ref 8.9–10.3)
Chloride: 99 mmol/L (ref 98–111)
Creatinine, Ser: 12.52 mg/dL — ABNORMAL HIGH (ref 0.44–1.00)
GFR, Estimated: 3 mL/min — ABNORMAL LOW (ref >60)
Glucose, Bld: 143 mg/dL — ABNORMAL HIGH (ref 70–99)
Potassium: 5.4 mmol/L — ABNORMAL HIGH (ref 3.5–5.1)
Sodium: 139 mmol/L (ref 135–145)

## 2024-01-23 LAB — CBC
HCT: 31 % — ABNORMAL LOW (ref 36.0–46.0)
Hemoglobin: 9.5 g/dL — ABNORMAL LOW (ref 12.0–15.0)
MCH: 29.3 pg (ref 26.0–34.0)
MCHC: 30.6 g/dL (ref 30.0–36.0)
MCV: 95.7 fL (ref 80.0–100.0)
Platelets: 166 10*3/uL (ref 150–400)
RBC: 3.24 MIL/uL — ABNORMAL LOW (ref 3.87–5.11)
RDW: 17.7 % — ABNORMAL HIGH (ref 11.5–15.5)
WBC: 5.2 10*3/uL (ref 4.0–10.5)
nRBC: 0 % (ref 0.0–0.2)

## 2024-01-23 MED ORDER — METOPROLOL TARTRATE 50 MG PO TABS
50.0000 mg | ORAL_TABLET | Freq: Two times a day (BID) | ORAL | Status: DC
  Administered 2024-01-23 – 2024-01-26 (×6): 50 mg via ORAL
  Filled 2024-01-23: qty 1
  Filled 2024-01-23 (×2): qty 2
  Filled 2024-01-23 (×3): qty 1

## 2024-01-23 MED ORDER — QUETIAPINE FUMARATE 25 MG PO TABS
50.0000 mg | ORAL_TABLET | Freq: Every evening | ORAL | Status: DC | PRN
Start: 2024-01-23 — End: 2024-01-26

## 2024-01-23 MED ORDER — APIXABAN 5 MG PO TABS
5.0000 mg | ORAL_TABLET | Freq: Two times a day (BID) | ORAL | Status: DC
  Administered 2024-01-23 – 2024-01-26 (×6): 5 mg via ORAL
  Filled 2024-01-23 (×6): qty 1

## 2024-01-23 MED ORDER — PANTOPRAZOLE SODIUM 40 MG PO TBEC
40.0000 mg | DELAYED_RELEASE_TABLET | Freq: Every day | ORAL | Status: DC
Start: 2024-01-23 — End: 2024-01-26
  Administered 2024-01-23 – 2024-01-26 (×4): 40 mg via ORAL
  Filled 2024-01-23 (×4): qty 1

## 2024-01-23 MED ORDER — PREGABALIN 75 MG PO CAPS
75.0000 mg | ORAL_CAPSULE | Freq: Every day | ORAL | Status: DC
Start: 2024-01-23 — End: 2024-01-26
  Administered 2024-01-23 – 2024-01-26 (×4): 75 mg via ORAL
  Filled 2024-01-23 (×4): qty 1

## 2024-01-23 MED ORDER — CHLORHEXIDINE GLUCONATE CLOTH 2 % EX PADS
6.0000 | MEDICATED_PAD | Freq: Every day | CUTANEOUS | Status: DC
  Administered 2024-01-25: 6 via TOPICAL
  Filled 2024-01-23 (×2): qty 6

## 2024-01-23 MED ORDER — LUMATEPERONE TOSYLATE 10.5 MG PO CAPS
10.5000 mg | ORAL_CAPSULE | Freq: Every day | ORAL | Status: DC
  Administered 2024-01-23 – 2024-01-25 (×3): 10.5 mg via ORAL
  Filled 2024-01-23 (×4): qty 1

## 2024-01-23 MED ORDER — BUDESONIDE 0.5 MG/2ML IN SUSP
0.5000 mg | Freq: Two times a day (BID) | RESPIRATORY_TRACT | Status: DC
  Administered 2024-01-23 – 2024-01-26 (×5): 0.5 mg via RESPIRATORY_TRACT
  Filled 2024-01-23 (×6): qty 2

## 2024-01-23 MED ORDER — ACETAMINOPHEN 325 MG PO TABS
ORAL_TABLET | ORAL | Status: AC
  Filled 2024-01-23: qty 2

## 2024-01-23 MED ORDER — TRAMADOL HCL 50 MG PO TABS
25.0000 mg | ORAL_TABLET | Freq: Four times a day (QID) | ORAL | Status: DC | PRN
  Administered 2024-01-23: 25 mg via ORAL
  Filled 2024-01-23: qty 1

## 2024-01-23 MED ORDER — BENZONATATE 100 MG PO CAPS
100.0000 mg | ORAL_CAPSULE | Freq: Three times a day (TID) | ORAL | Status: DC
  Administered 2024-01-23 – 2024-01-26 (×9): 100 mg via ORAL
  Filled 2024-01-23 (×10): qty 1

## 2024-01-23 MED ORDER — CYCLOBENZAPRINE HCL 10 MG PO TABS
5.0000 mg | ORAL_TABLET | Freq: Three times a day (TID) | ORAL | Status: DC
  Administered 2024-01-23 – 2024-01-26 (×8): 5 mg via ORAL
  Filled 2024-01-23 (×9): qty 1

## 2024-01-23 MED ORDER — PATIROMER SORBITEX CALCIUM 8.4 G PO PACK
16.8000 g | PACK | Freq: Every day | ORAL | Status: DC
  Administered 2024-01-24 – 2024-01-25 (×2): 16.8 g via ORAL
  Filled 2024-01-23 (×3): qty 2

## 2024-01-23 MED ORDER — AZITHROMYCIN 250 MG PO TABS
500.0000 mg | ORAL_TABLET | Freq: Every day | ORAL | Status: AC
  Administered 2024-01-23 – 2024-01-25 (×3): 500 mg via ORAL
  Filled 2024-01-23 (×3): qty 1
  Filled 2024-01-23: qty 2

## 2024-01-23 NOTE — Progress Notes (Signed)
[facility]  ROUNDING NOTE   Subjective:   Janet Olson is a 54 y.o. female with past medical conditions including COPD, hypertension, and end-stage renal disease on hemodialysis.  Patient presents to the emergency department complaining of shortness of breath and wheeze and has been admitted for Dyspnea on exertion [R06.09] Hypoxia [R09.02] COPD exacerbation (HCC) [J44.1] Acute respiratory failure with hypoxia (HCC) [J96.01]  Patient is known to our practice and receives outpatient dialysis treatments at [facility] on a MWF schedule, supervised by Dr. Cherylann Ratel.  Last treatment completed on Monday.  Patient states she began having symptoms after dialysis on Monday.  Denies any recent missed treatments.  Does states she was recently admitted to [facility] but did not experience the symptoms then.  Patient does state that her outpatient dialysis clinic has had a recent increase in sick patients.  Labs on ED arrival significant for BUN 62, creatinine 11.73 with GFR 3 and hemoglobin 10.1.  Chest x-ray shows pulmonary vascular congestion without edema.Respiratory panel positive for RSV.  Potassium today 5.4.  Patient will receive dialysis treatment.  We have been consulted to maintain dialysis during this admission.   Objective:  Vital signs in last 24 hours:  Temp:  [97.7 F (36.5 C)-98.9 F (37.2 C)] 98.9 F (37.2 C) (01/23 1400) Pulse Rate:  [49-100] 99 (01/23 1400) Resp:  [16-33] 24 (01/23 1400) BP: (118-167)/(64-109) 121/109 (01/23 1400) SpO2:  [86 %-100 %] 96 % (01/23 1400) Weight:  [93.9 kg-94.8 kg] 94.8 kg (01/23 1016)  Weight change:  Filed Weights   01/22/24 1631 01/23/24 1016  Weight: 93.9 kg 94.8 kg    Intake/Output: No intake/output data recorded.   Intake/Output this shift:  No intake/output data recorded.  Physical Exam: General: NAD  Head: Normocephalic, atraumatic. Moist oral mucosal membranes  Eyes: Anicteric  Neck: Supple, trachea midline   Lungs:  Clear to auscultation, dry cough  Heart: Regular rate and rhythm  Abdomen:  Soft, nontender  Extremities:  No peripheral edema.  Neurologic: Nonfocal, moving all four extremities  Skin: No lesions  Access: Lt AVG    Basic Metabolic Panel: Recent Labs  Lab 01/22/24 1633 01/23/24 0634  NA 140 139  K 4.6 5.4*  CL 99 99  CO2 26 22  GLUCOSE 103* 143*  BUN 62* 77*  CREATININE 11.73* 12.52*  CALCIUM 8.3* 7.4*    Liver Function Tests: Recent Labs  Lab 01/22/24 1633  AST 23  ALT 21  ALKPHOS 175*  BILITOT 1.3*  PROT 7.1  ALBUMIN 3.6   No results for input(s): "LIPASE", "AMYLASE" in the last 168 hours. No results for input(s): "AMMONIA" in the last 168 hours.  CBC: Recent Labs  Lab 01/22/24 1633 01/23/24 0634  WBC 5.5 5.2  NEUTROABS 3.4  --   HGB 10.1* 9.5*  HCT 32.0* 31.0*  MCV 95.5 95.7  PLT 174 166    Cardiac Enzymes: No results for input(s): "CKTOTAL", "CKMB", "CKMBINDEX", "TROPONINI" in the last 168 hours.  BNP: Invalid input(s): "POCBNP"  CBG: No results for input(s): "GLUCAP" in the last 168 hours.  Microbiology: Results for orders placed or performed during the hospital encounter of 01/22/24  Blood culture (routine single)     Status: None (Preliminary result)   Collection Time: 01/22/24  4:34 PM   Specimen: BLOOD  Result Value Ref Range Status   Specimen Description BLOOD BLOOD RIGHT ARM  Final   Special Requests   Final    BOTTLES DRAWN AEROBIC AND ANAEROBIC Blood Culture  results may not be optimal due to an inadequate volume of blood received in culture bottles   Culture   Final    NO GROWTH < 24 HOURS Performed at [facility], [address] [place], [place] [address]    Report Status PENDING  Incomplete  Resp panel by RT-PCR (RSV, Flu A&B, Covid) Anterior Nasal Swab     Status: Abnormal   Collection Time: 01/22/24  5:51 PM   Specimen: Anterior Nasal Swab  Result Value Ref Range Status   SARS Coronavirus 2 by RT PCR  NEGATIVE NEGATIVE Final    Comment: (NOTE) SARS-CoV-2 target nucleic acids are NOT DETECTED.  The SARS-CoV-2 RNA is generally detectable in upper respiratory specimens during the acute phase of infection. The lowest concentration of SARS-CoV-2 viral copies this assay can detect is 138 copies/mL. A negative result does not preclude SARS-Cov-2 infection and should not be used as the sole basis for treatment or other patient management decisions. A negative result may occur with  improper specimen collection/handling, submission of specimen other than nasopharyngeal swab, presence of viral mutation(s) within the areas targeted by this assay, and inadequate number of viral copies(<138 copies/mL). A negative result must be combined with clinical observations, patient history, and epidemiological information. The expected result is Negative.  Fact Sheet for Patients:  BloggerCourse.com  Fact Sheet for Healthcare Providers:  SeriousBroker.it  This test is no t yet approved or cleared by the [place] FDA and  has been authorized for detection and/or diagnosis of SARS-CoV-2 by FDA under an Emergency Use Authorization (EUA). This EUA will remain  in effect (meaning this test can be used) for the duration of the COVID-19 declaration under Section 564(b)(1) of the Act, 21 U.S.C.section 360bbb-3(b)(1), unless the authorization is terminated  or revoked sooner.       Influenza A by PCR NEGATIVE NEGATIVE Final   Influenza B by PCR NEGATIVE NEGATIVE Final    Comment: (NOTE) The Xpert Xpress SARS-CoV-2/FLU/RSV plus assay is intended as an aid in the diagnosis of influenza from Nasopharyngeal swab specimens and should not be used as a sole basis for treatment. Nasal washings and aspirates are unacceptable for Xpert Xpress SARS-CoV-2/FLU/RSV testing.  Fact Sheet for Patients: BloggerCourse.com  Fact Sheet for  Healthcare Providers: SeriousBroker.it  This test is not yet approved or cleared by the [place] FDA and has been authorized for detection and/or diagnosis of SARS-CoV-2 by FDA under an Emergency Use Authorization (EUA). This EUA will remain in effect (meaning this test can be used) for the duration of the COVID-19 declaration under Section 564(b)(1) of the Act, 21 U.S.C. section 360bbb-3(b)(1), unless the authorization is terminated or revoked.     Resp Syncytial Virus by PCR POSITIVE (A) NEGATIVE Final    Comment: (NOTE) Fact Sheet for Patients: BloggerCourse.com  Fact Sheet for Healthcare Providers: SeriousBroker.it  This test is not yet approved or cleared by the [place] FDA and has been authorized for detection and/or diagnosis of SARS-CoV-2 by FDA under an Emergency Use Authorization (EUA). This EUA will remain in effect (meaning this test can be used) for the duration of the COVID-19 declaration under Section 564(b)(1) of the Act, 21 U.S.C. section 360bbb-3(b)(1), unless the authorization is terminated or revoked.  Performed at [facility], [address] [place], [place] [address]     Coagulation Studies: Recent Labs    01/22/24 1633  LABPROT 18.2*  INR 1.5*    Urinalysis: No results for input(s): "COLORURINE", "LABSPEC", "PHURINE", "  GLUCOSEU", "HGBUR", "BILIRUBINUR", "KETONESUR", "PROTEINUR", "UROBILINOGEN", "NITRITE", "LEUKOCYTESUR" in the last 72 hours.  Invalid input(s): "APPERANCEUR"    Imaging: DG Chest Port 1 View Result Date: 01/22/2024 CLINICAL DATA:  CHF, COPD exacerbation, hypoxia EXAM: PORTABLE CHEST 1 VIEW COMPARISON:  04/11/2022 FINDINGS: Single frontal view of the chest demonstrates marked enlargement of the cardiac silhouette, stable. Chronic pulmonary vascular congestion without airspace disease, effusion, or pneumothorax. No acute bony  abnormalities. IMPRESSION: 1. Stable enlarged cardiac silhouette. 2. Chronic pulmonary vascular congestion without overt edema. Electronically Signed   By: Sharlet Salina M.D.   On: 01/22/2024 17:15     Medications:     azithromycin  500 mg Oral Daily   benzonatate  100 mg Oral TID   Chlorhexidine Gluconate Cloth  6 each Topical Q0600   heparin  5,000 Units Subcutaneous Q8H   ipratropium-albuterol  3 mL Nebulization QID   [START ON 01/24/2024] predniSONE  40 mg Oral Q breakfast   acetaminophen **OR** acetaminophen, chlorpheniramine-HYDROcodone, guaiFENesin, hydrOXYzine, nicotine, ondansetron **OR** ondansetron (ZOFRAN) IV, senna-docusate  Assessment/ Plan:  [name] is a 54 y.o.  female with past medical conditions including COPD, hypertension, and end-stage renal disease on hemodialysis.  Patient presents to the emergency department complaining of shortness of breath and wheeze and has been admitted for Dyspnea on exertion [R06.09] Hypoxia [R09.02] COPD exacerbation (HCC) [J44.1] Acute respiratory failure with hypoxia (HCC) [J96.01]  [facility] MWF Lt AVG  End stage renal disease on hemodialysis. Last treatment on Monday. Will receive treatment today to make up for missed treatment on Wednesday. Patient requested increased UF  to 2.3L. Terminated treatment early due to cramping. Will complete short treatment tomorrow to maintain outpatient schedule.   2. Acute respiratory failure due to RSV. Chest xray shows vascular congestion. Supportive care.   3. Anemia of chronic kidney disease Lab Results  Component Value Date   HGB 9.5 (L) 01/23/2024    Hgb within optimal range. Patient receives Mircera at outpatient clinic.   4. Secondary Hyperparathyroidism: with outpatient labs: PTH 2952, phosphorus 6.7, calcium 7.9 on 01/13/24.   Lab Results  Component Value Date   PTH 73 (H) 01/03/2019   PTH Comment 01/03/2019   CALCIUM 7.4 (L) 01/23/2024   CAION 0.87 (LL)  12/06/2022   PHOS 3.9 04/13/2022    Will continue to monitor bone minerals during this admission.    LOS: 1 Clessie Karras 1/23/20252:16 PM

## 2024-01-23 NOTE — Progress Notes (Signed)
  Progress Note   Patient: Janet Olson VHQ:[id number] DOB: 1970-08-08 DOA: 01/22/2024     1 DOS: the patient was seen and examined on 01/23/2024   Brief hospital course: [name] is a 54 year old female with history of hypertension, end-stage renal disease on hemodialysis, COPD, continued tobacco use, no prior oxygen supplementation, who presents emergency department for chief concerns of shortness of breath and wheezing. Her oxygen saturation has dropped down to 86%, she was placed on 3 L oxygen. Chest x-ray showed vascular congestion.  She was also positive for RSV. Patient was started on IV steroids and transition to oral prednisone for COPD exacerbation.     Principal Problem:   COPD with acute exacerbation (HCC) Active Problems:   ESRD on dialysis (HCC)   RSV (respiratory syncytial virus pneumonia)   Elevated troponin   Cough   Acute respiratory failure with hypoxia (HCC)   Tobacco use   Tobacco dependence syndrome   Dyspnea on exertion   Assessment and Plan: Acute hypoxemic respiratory failure. RSV infection. * COPD with acute exacerbation (HCC) Worsening short of breath, she also developed hypoxemia.  This is a multifactorial, due to RSV infection with COPD exacerbation, end-stage renal disease with pulmonary edema. Patient has received IV steroids, will continue oral prednisone. Chest x-ray did not show any evidence of pneumonia, procalcitonin level 1.28 with end-stage renal disease.  Will add Zithromax.  Continue scheduled bronchodilators.  ESRD on dialysis (HCC) Hyperkalemia. Anemia of chronic disease. Patient did not get dialysis yesterday, she will be dialyzed today.  Elevated troponin Suspect secondary to demand ischemia in setting of acute hypoxic respiratory failure due to COPD exacerbation in setting of RSV infection  Tobacco dependence syndrome Advised to quit.  Diabetes with BMI 33.73.  Diet and excise.    Subjective:  Patient still has  significant short of breath today, cough, nonproductive.  Physical Exam: Vitals:   01/23/24 1038 01/23/24 1100 01/23/24 1130 01/23/24 1200  BP: 138/75 (!) 151/90 139/79 (!) 148/86  Pulse: 88 96 89 95  Resp: (!) 24 (!) 33 17 (!) 23  Temp:      TempSrc:      SpO2: 100% 98% 97% 97%  Weight:      Height:       General exam: Appears calm and comfortable  Respiratory system: Decreased breathing sounds. Respiratory effort normal. Cardiovascular system: S1 & S2 heard, RRR. No JVD, murmurs, rubs, gallops or clicks. No pedal edema. Gastrointestinal system: Abdomen is nondistended, soft and nontender. No organomegaly or masses felt. Normal bowel sounds heard. Central nervous system: Alert and oriented. No focal neurological deficits. Extremities: Symmetric 5 x 5 power. Skin: No rashes, lesions or ulcers Psychiatry: Judgement and insight appear normal. Mood & affect appropriate.    Data Reviewed:  Chest x-ray and lab results reviewed.  Family Communication: None  Disposition: Status is: Inpatient Remains inpatient appropriate because: Severity of disease, IV treatment.     Time spent: 35 minutes  Author: Marrion Coy, MD 01/23/2024 12:48 PM  For on call review [email or web address].

## 2024-01-23 NOTE — Discharge Planning (Signed)
ESTABLISHED HEMODIALYSIS: Outpatient Facility [facility]  [address]  [place], [place] [address] [phone]  Schedule: MWF 11:00   Dimas Chyle Dialysis Coordinator II  [facility] Cell: [phone] eFax: [phone] [email or web address]

## 2024-01-23 NOTE — Progress Notes (Signed)
Received patient in bed to unit.  Alert and oriented.  Informed consent signed and in chart.   TX duration: 2 hours 30 minutes signed AMA d/t coming off Tx early  Patient tolerated well.  Transported back to the room  Alert, without acute distress.  Hand-off given to patient's nurse. Jalene Mullet  Access used: LUE AVF Access issues: none  Total UF removed: Medication(s) given: Acetaminophen and Tessalon pearls Post HD VS: BP 121/109 P 99 resp 24 o2 Sat 96% Post HD weight:   Freddie Breech, RN Kidney Dialysis Unit

## 2024-01-24 DIAGNOSIS — J9601 Acute respiratory failure with hypoxia: Secondary | ICD-10-CM | POA: Diagnosis not present

## 2024-01-24 DIAGNOSIS — N186 End stage renal disease: Secondary | ICD-10-CM | POA: Diagnosis not present

## 2024-01-24 DIAGNOSIS — J441 Chronic obstructive pulmonary disease with (acute) exacerbation: Secondary | ICD-10-CM | POA: Diagnosis not present

## 2024-01-24 DIAGNOSIS — J121 Respiratory syncytial virus pneumonia: Secondary | ICD-10-CM | POA: Diagnosis not present

## 2024-01-24 LAB — CBC WITH DIFFERENTIAL/PLATELET
Abs Immature Granulocytes: 0.07 10*3/uL (ref 0.00–0.07)
Basophils Absolute: 0 10*3/uL (ref 0.0–0.1)
Basophils Relative: 0 %
Eosinophils Absolute: 0 10*3/uL (ref 0.0–0.5)
Eosinophils Relative: 1 %
HCT: 31.6 % — ABNORMAL LOW (ref 36.0–46.0)
Hemoglobin: 9.7 g/dL — ABNORMAL LOW (ref 12.0–15.0)
Immature Granulocytes: 1 %
Lymphocytes Relative: 11 %
Lymphs Abs: 0.7 10*3/uL (ref 0.7–4.0)
MCH: 28.9 pg (ref 26.0–34.0)
MCHC: 30.7 g/dL (ref 30.0–36.0)
MCV: 94 fL (ref 80.0–100.0)
Monocytes Absolute: 0.4 10*3/uL (ref 0.1–1.0)
Monocytes Relative: 7 %
Neutro Abs: 4.8 10*3/uL (ref 1.7–7.7)
Neutrophils Relative %: 80 %
Platelets: 168 10*3/uL (ref 150–400)
RBC: 3.36 MIL/uL — ABNORMAL LOW (ref 3.87–5.11)
RDW: 17.2 % — ABNORMAL HIGH (ref 11.5–15.5)
WBC: 6.1 10*3/uL (ref 4.0–10.5)
nRBC: 0 % (ref 0.0–0.2)

## 2024-01-24 LAB — RENAL FUNCTION PANEL
Albumin: 3.5 g/dL (ref 3.5–5.0)
Anion gap: 16 — ABNORMAL HIGH (ref 5–15)
BUN: 69 mg/dL — ABNORMAL HIGH (ref 6–20)
CO2: 24 mmol/L (ref 22–32)
Calcium: 7.8 mg/dL — ABNORMAL LOW (ref 8.9–10.3)
Chloride: 97 mmol/L — ABNORMAL LOW (ref 98–111)
Creatinine, Ser: 10.47 mg/dL — ABNORMAL HIGH (ref 0.44–1.00)
GFR, Estimated: 4 mL/min — ABNORMAL LOW (ref >60)
Glucose, Bld: 106 mg/dL — ABNORMAL HIGH (ref 70–99)
Phosphorus: 5.9 mg/dL — ABNORMAL HIGH (ref 2.5–4.6)
Potassium: 5.8 mmol/L — ABNORMAL HIGH (ref 3.5–5.1)
Sodium: 137 mmol/L (ref 135–145)

## 2024-01-24 LAB — HEPATITIS B SURFACE ANTIGEN: Hepatitis B Surface Ag: NONREACTIVE

## 2024-01-24 MED ORDER — IPRATROPIUM-ALBUTEROL 0.5-2.5 (3) MG/3ML IN SOLN
3.0000 mL | Freq: Four times a day (QID) | RESPIRATORY_TRACT | Status: DC
  Administered 2024-01-24 – 2024-01-25 (×3): 3 mL via RESPIRATORY_TRACT
  Filled 2024-01-24 (×5): qty 3

## 2024-01-24 MED ORDER — LIDOCAINE-PRILOCAINE 2.5-2.5 % EX CREA
1.0000 | TOPICAL_CREAM | CUTANEOUS | Status: DC | PRN
Start: 2024-01-24 — End: 2024-01-26

## 2024-01-24 MED ORDER — VITAMIN D (ERGOCALCIFEROL) 1.25 MG (50000 UNIT) PO CAPS
50000.0000 [IU] | ORAL_CAPSULE | ORAL | Status: DC
  Administered 2024-01-25: 50000 [IU] via ORAL
  Filled 2024-01-24: qty 1

## 2024-01-24 MED ORDER — HYDROCOD POLI-CHLORPHE POLI ER 10-8 MG/5ML PO SUER
5.0000 mL | Freq: Two times a day (BID) | ORAL | Status: DC | PRN
  Administered 2024-01-24 – 2024-01-26 (×3): 5 mL via ORAL
  Filled 2024-01-24 (×3): qty 5

## 2024-01-24 MED ORDER — LANTHANUM CARBONATE 500 MG PO CHEW
1000.0000 mg | CHEWABLE_TABLET | Freq: Three times a day (TID) | ORAL | Status: DC
  Administered 2024-01-25 (×3): 1000 mg via ORAL
  Filled 2024-01-24 (×6): qty 2

## 2024-01-24 MED ORDER — MIDODRINE HCL 5 MG PO TABS: 30.0000 mg | ORAL_TABLET | ORAL | Status: DC

## 2024-01-24 MED ORDER — RENA-VITE PO TABS
1.0000 | ORAL_TABLET | Freq: Every day | ORAL | Status: DC
  Administered 2024-01-24 – 2024-01-26 (×3): 1 via ORAL
  Filled 2024-01-24 (×3): qty 1

## 2024-01-24 MED ORDER — FUROSEMIDE 40 MG PO TABS
40.0000 mg | ORAL_TABLET | Freq: Every day | ORAL | Status: DC
  Administered 2024-01-24 – 2024-01-26 (×3): 40 mg via ORAL
  Filled 2024-01-24 (×3): qty 1

## 2024-01-24 NOTE — Progress Notes (Signed)
Pt arrived to 224 from dialysis. Received report from [place], [place].

## 2024-01-24 NOTE — ED Notes (Signed)
Pt resting w/ equal rise and fall of chest noted.

## 2024-01-24 NOTE — TOC Initial Note (Signed)
Transition of Care [facility]) - Initial/Assessment Note    Patient Details  Name: Janet Olson MRN: [id number] Date of Birth: 18-Jul-1970  Transition of Care [facility]) CM/SW Contact:    Margarito Liner, LCSW Phone Number: 01/24/2024, 1:06 PM  Clinical Narrative:  Readmission prevention screen complete. CSW met with patient. No supports at bedside. CSW introduced role and explained that discharge planning would be discussed. PCP is Franco Nones, FNP. Patient drives herself to appointments. Pharmacy is [facility] in [place]. No issues obtaining medications. Patient lives home with her son. No home health or DME use prior to admission. Patient is currently on acute oxygen. Will follow for this potential home need as well. No further concerns. CSW will continue to follow patient for support and facilitate return home once stable. Her son will transport her home at discharge                Expected Discharge Plan: Home/Self Care Barriers to Discharge: Continued Medical Work up   Patient Goals and CMS Choice            Expected Discharge Plan and Services     Post Acute Care Choice: NA Living arrangements for the past 2 months: Single Family Home                                      Prior Living Arrangements/Services Living arrangements for the past 2 months: Single Family Home Lives with:: Adult Children Patient language and need for interpreter reviewed:: Yes Do you feel safe going back to the place where you live?: Yes      Need for Family Participation in Patient Care: Yes (Comment) Care giver support system in place?: Yes (comment)   Criminal Activity/Legal Involvement Pertinent to Current Situation/Hospitalization: No - Comment as needed  Activities of Daily Living   ADL Screening (condition at time of admission) Independently performs ADLs?: Yes (appropriate for developmental age) Is the patient deaf or have difficulty hearing?: No Does the patient have difficulty  seeing, even when wearing glasses/contacts?: No Does the patient have difficulty concentrating, remembering, or making decisions?: No  Permission Sought/Granted                  Emotional Assessment Appearance:: Appears stated age Attitude/Demeanor/Rapport: Engaged, Gracious Affect (typically observed): Accepting, Appropriate, Calm, Pleasant Orientation: : Oriented to Self, Oriented to Place, Oriented to  Time, Oriented to Situation Alcohol / Substance Use: Not Applicable Psych Involvement: No (comment)  Admission diagnosis:  Dyspnea on exertion [R06.09] Hypoxia [R09.02] COPD exacerbation (HCC) [J44.1] Acute respiratory failure with hypoxia (HCC) [J96.01] Patient Active Problem List   Diagnosis Date Noted   Dyspnea on exertion 01/22/2024   RSV (respiratory syncytial virus pneumonia) 01/22/2024   COPD with acute exacerbation (HCC) 01/22/2024   Elevated troponin 01/22/2024   Cough 01/22/2024   Hyperlipidemia 02/14/2023   Hypertensive disorder 02/14/2023   Tobacco dependence syndrome 02/14/2023   Chronic diarrhea 12/06/2022   Polyp of ascending colon 12/06/2022   Aortic atherosclerosis (HCC) 11/19/2022   Pain in joint, shoulder region 05/25/2021   Benign essential HTN 11/07/2020   Anxiety 11/07/2020   Depression 11/07/2020   GERD (gastroesophageal reflux disease) 11/07/2020   Infection of hemodialysis tunneled catheter (HCC) 11/07/2020   Other ascites 11/07/2020   Ear mass, left 11/07/2020   Mass of ear canal, left 11/02/2020   Acute on chronic diastolic CHF (congestive heart failure) (HCC)  03/03/2020   Diastolic dysfunction with chronic heart failure (HCC) 04/28/2019   Nonrheumatic tricuspid valve regurgitation 04/28/2019   Tobacco use 04/28/2019   Acute respiratory failure with hypoxia (HCC) 09/25/2018   Renal dialysis device, implant, or graft complication 05/19/2018   ESRD on dialysis (HCC) 08/02/2017   COPD (chronic obstructive pulmonary disease) (HCC) 06/20/2017    Anemia in chronic kidney disease 06/07/2017   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   Mass of left axilla 12/07/2013   PCP:  Armando Gang, FNP Pharmacy:   [facility] [address] [place] [address] Phone: [phone] Fax: [phone]  [id number] - [place], [place] - [address] AT [facility] OF [address] [place] [address] Phone: [phone] Fax: [phone]     Social Drivers of Health (SDOH) Social History: SDOH Screenings   Food Insecurity: No Food Insecurity (01/23/2024)  Housing: Low Risk  (01/23/2024)  Transportation Needs: No Transportation Needs (01/23/2024)  Utilities: Not At Risk (01/23/2024)  Financial Resource Strain: Low Risk  (01/09/2024)   Received from [facility]  Social Connections: Moderately Integrated (01/23/2024)  Tobacco Use: High Risk (01/23/2024)   SDOH Interventions:     Readmission Risk Interventions    01/24/2024    1:06 PM  Readmission Risk Prevention Plan  Transportation Screening Complete  PCP or Specialist Appt within 3-5 Days Complete  Social Work Consult for Recovery Care Planning/Counseling Complete  Palliative Care Screening Not Applicable  Medication Review Oceanographer) Complete

## 2024-01-24 NOTE — Progress Notes (Signed)
  Progress Note   Patient: Janet Olson:[id number] DOB: 1970/01/14 DOA: 01/22/2024     2 DOS: the patient was seen and examined on 01/24/2024   Brief hospital course: Janet Olson is a 54 year old female with history of hypertension, end-stage renal disease on hemodialysis, COPD, continued tobacco use, no prior oxygen supplementation, who presents emergency department for chief concerns of shortness of breath and wheezing. Her oxygen saturation has dropped down to 86%, she was placed on 3 L oxygen. Chest x-ray showed vascular congestion.  She was also positive for RSV. Patient was started on IV steroids and transition to oral prednisone for COPD exacerbation.     Principal Problem:   COPD with acute exacerbation (HCC) Active Problems:   ESRD on dialysis (HCC)   RSV (respiratory syncytial virus pneumonia)   Elevated troponin   Cough   Acute respiratory failure with hypoxia (HCC)   Tobacco use   Tobacco dependence syndrome   Dyspnea on exertion   Assessment and Plan: Acute hypoxemic respiratory failure. RSV infection. * COPD with acute exacerbation (HCC) Worsening short of breath, she also developed hypoxemia.  This is a multifactorial, due to RSV infection with COPD exacerbation, end-stage renal disease with pulmonary edema. Patient has received IV steroids, will continue oral prednisone. Chest x-ray did not show any evidence of pneumonia, procalcitonin level 1.28 with end-stage renal disease.  Added 3 days of Zithromax.  Continued scheduled bronchodilators. Patient still complaining short of breath and wheezing.  Continue current treatment.   ESRD on dialysis (HCC) Hyperkalemia. Anemia of chronic disease. Patient received dialysis yesterday, potassium has normalized.   Elevated troponin Suspect secondary to demand ischemia in setting of acute hypoxic respiratory failure due to COPD exacerbation in setting of RSV infection   Tobacco dependence syndrome Advised to  quit.   Diabetes with BMI 33.73.  Diet and excise.      Subjective:  Patient still complaining short of breath and wheezing.  Cough, nonproductive.  Physical Exam: Vitals:   01/24/24 0526 01/24/24 0551 01/24/24 0939 01/24/24 0954  BP: 113/79  125/85 125/85  Pulse: 93  (!) 105 98  Resp: 18   (!) 22  Temp:  98.7 F (37.1 C)    TempSrc:      SpO2: 93%   99%  Weight:      Height:       General exam: Appears calm and comfortable, obese Respiratory system: Decreased breathing sounds with some wheezes. Respiratory effort normal. Cardiovascular system: S1 & S2 heard, RRR. No JVD, murmurs, rubs, gallops or clicks. No pedal edema. Gastrointestinal system: Abdomen is nondistended, soft and nontender. No organomegaly or masses felt. Normal bowel sounds heard. Central nervous system: Alert and oriented. No focal neurological deficits. Extremities: Symmetric 5 x 5 power. Skin: No rashes, lesions or ulcers Psychiatry: Judgement and insight appear normal. Mood & affect appropriate.    Data Reviewed:  Lab results reviewed.  Family Communication: None  Disposition: Status is: Inpatient Remains inpatient appropriate because: Severity of disease,     Time spent: 35 minutes  Author: Marrion Coy, MD 01/24/2024 10:36 AM  For on call review [email or web address].

## 2024-01-24 NOTE — ED Notes (Signed)
Dialysis called and report was given to them. They stated all labs due will be done there.

## 2024-01-24 NOTE — Progress Notes (Signed)
[facility]  ROUNDING NOTE   Subjective:   Janet Olson is a 54 y.o. female with past medical conditions including COPD, hypertension, and end-stage renal disease on hemodialysis.  Patient presents to the emergency department complaining of shortness of breath and wheeze and has been admitted for Dyspnea on exertion [R06.09] Hypoxia [R09.02] COPD exacerbation (HCC) [J44.1] Acute respiratory failure with hypoxia (HCC) [J96.01]  Patient is known to our practice and receives outpatient dialysis treatments at [facility] on a MWF schedule, supervised by Dr. Cherylann Ratel.    Patient seen resting in bed Alert Continues to have cough Overall, feels better since admission.   Dialysis scheduled for later today   Objective:  Vital signs in last 24 hours:  Temp:  [98.5 F (36.9 C)-99.9 F (37.7 C)] 99.9 F (37.7 C) (01/24 1101) Pulse Rate:  [68-105] 98 (01/24 0954) Resp:  [16-24] 22 (01/24 0954) BP: (113-148)/(69-109) 125/85 (01/24 0954) SpO2:  [93 %-100 %] 99 % (01/24 0954)  Weight change: 0.905 kg Filed Weights   01/22/24 1631 01/23/24 1016  Weight: 93.9 kg 94.8 kg    Intake/Output: No intake/output data recorded.   Intake/Output this shift:  No intake/output data recorded.  Physical Exam: General: NAD  Head: Normocephalic, atraumatic. Moist oral mucosal membranes  Eyes: Anicteric  Neck: Supple, trachea midline  Lungs:  Clear to auscultation, dry cough  Heart: Regular rate and rhythm  Abdomen:  Soft, nontender  Extremities:  No peripheral edema.  Neurologic: Alert, moving all four extremities  Skin: No lesions  Access: Lt AVG    Basic Metabolic Panel: Recent Labs  Lab 01/22/24 1633 01/23/24 0634  NA 140 139  K 4.6 5.4*  CL 99 99  CO2 26 22  GLUCOSE 103* 143*  BUN 62* 77*  CREATININE 11.73* 12.52*  CALCIUM 8.3* 7.4*    Liver Function Tests: Recent Labs  Lab 01/22/24 1633  AST 23  ALT 21  ALKPHOS 175*  BILITOT 1.3*  PROT 7.1   ALBUMIN 3.6   No results for input(s): "LIPASE", "AMYLASE" in the last 168 hours. No results for input(s): "AMMONIA" in the last 168 hours.  CBC: Recent Labs  Lab 01/22/24 1633 01/23/24 0634  WBC 5.5 5.2  NEUTROABS 3.4  --   HGB 10.1* 9.5*  HCT 32.0* 31.0*  MCV 95.5 95.7  PLT 174 166    Cardiac Enzymes: No results for input(s): "CKTOTAL", "CKMB", "CKMBINDEX", "TROPONINI" in the last 168 hours.  BNP: Invalid input(s): "POCBNP"  CBG: No results for input(s): "GLUCAP" in the last 168 hours.  Microbiology: Results for orders placed or performed during the hospital encounter of 01/22/24  Blood culture (routine single)     Status: None (Preliminary result)   Collection Time: 01/22/24  4:34 PM   Specimen: BLOOD  Result Value Ref Range Status   Specimen Description BLOOD BLOOD RIGHT ARM  Final   Special Requests   Final    BOTTLES DRAWN AEROBIC AND ANAEROBIC Blood Culture results may not be optimal due to an inadequate volume of blood received in culture bottles   Culture   Final    NO GROWTH 2 DAYS Performed at [facility], [address] [place], [place] [address]    Report Status PENDING  Incomplete  Resp panel by RT-PCR (RSV, Flu A&B, Covid) Anterior Nasal Swab     Status: Abnormal   Collection Time: 01/22/24  5:51 PM   Specimen: Anterior Nasal Swab  Result Value Ref Range Status   SARS  Coronavirus 2 by RT PCR NEGATIVE NEGATIVE Final    Comment: (NOTE) SARS-CoV-2 target nucleic acids are NOT DETECTED.  The SARS-CoV-2 RNA is generally detectable in upper respiratory specimens during the acute phase of infection. The lowest concentration of SARS-CoV-2 viral copies this assay can detect is 138 copies/mL. A negative result does not preclude SARS-Cov-2 infection and should not be used as the sole basis for treatment or other patient management decisions. A negative result may occur with  improper specimen collection/handling, submission of specimen  other than nasopharyngeal swab, presence of viral mutation(s) within the areas targeted by this assay, and inadequate number of viral copies(<138 copies/mL). A negative result must be combined with clinical observations, patient history, and epidemiological information. The expected result is Negative.  Fact Sheet for Patients:  BloggerCourse.com  Fact Sheet for Healthcare Providers:  SeriousBroker.it  This test is no t yet approved or cleared by the [place] FDA and  has been authorized for detection and/or diagnosis of SARS-CoV-2 by FDA under an Emergency Use Authorization (EUA). This EUA will remain  in effect (meaning this test can be used) for the duration of the COVID-19 declaration under Section 564(b)(1) of the Act, 21 U.S.C.section 360bbb-3(b)(1), unless the authorization is terminated  or revoked sooner.       Influenza A by PCR NEGATIVE NEGATIVE Final   Influenza B by PCR NEGATIVE NEGATIVE Final    Comment: (NOTE) The Xpert Xpress SARS-CoV-2/FLU/RSV plus assay is intended as an aid in the diagnosis of influenza from Nasopharyngeal swab specimens and should not be used as a sole basis for treatment. Nasal washings and aspirates are unacceptable for Xpert Xpress SARS-CoV-2/FLU/RSV testing.  Fact Sheet for Patients: BloggerCourse.com  Fact Sheet for Healthcare Providers: SeriousBroker.it  This test is not yet approved or cleared by the [place] FDA and has been authorized for detection and/or diagnosis of SARS-CoV-2 by FDA under an Emergency Use Authorization (EUA). This EUA will remain in effect (meaning this test can be used) for the duration of the COVID-19 declaration under Section 564(b)(1) of the Act, 21 U.S.C. section 360bbb-3(b)(1), unless the authorization is terminated or revoked.     Resp Syncytial Virus by PCR POSITIVE (A) NEGATIVE Final     Comment: (NOTE) Fact Sheet for Patients: BloggerCourse.com  Fact Sheet for Healthcare Providers: SeriousBroker.it  This test is not yet approved or cleared by the [place] FDA and has been authorized for detection and/or diagnosis of SARS-CoV-2 by FDA under an Emergency Use Authorization (EUA). This EUA will remain in effect (meaning this test can be used) for the duration of the COVID-19 declaration under Section 564(b)(1) of the Act, 21 U.S.C. section 360bbb-3(b)(1), unless the authorization is terminated or revoked.  Performed at [facility], [address] [place], [place] [address]     Coagulation Studies: Recent Labs    01/22/24 1633  LABPROT 18.2*  INR 1.5*    Urinalysis: No results for input(s): "COLORURINE", "LABSPEC", "PHURINE", "GLUCOSEU", "HGBUR", "BILIRUBINUR", "KETONESUR", "PROTEINUR", "UROBILINOGEN", "NITRITE", "LEUKOCYTESUR" in the last 72 hours.  Invalid input(s): "APPERANCEUR"    Imaging: DG Chest Port 1 View Result Date: 01/22/2024 CLINICAL DATA:  CHF, COPD exacerbation, hypoxia EXAM: PORTABLE CHEST 1 VIEW COMPARISON:  04/11/2022 FINDINGS: Single frontal view of the chest demonstrates marked enlargement of the cardiac silhouette, stable. Chronic pulmonary vascular congestion without airspace disease, effusion, or pneumothorax. No acute bony abnormalities. IMPRESSION: 1. Stable enlarged cardiac silhouette. 2. Chronic pulmonary vascular congestion without overt edema. Electronically Signed  By: Sharlet Salina M.D.   On: 01/22/2024 17:15     Medications:     apixaban  5 mg Oral BID   azithromycin  500 mg Oral Daily   benzonatate  100 mg Oral TID   budesonide  0.5 mg Nebulization BID   Chlorhexidine Gluconate Cloth  6 each Topical Q0600   cyclobenzaprine  5 mg Oral TID   ipratropium-albuterol  3 mL Nebulization QID   lumateperone tosylate  10.5 mg Oral Daily   metoprolol tartrate  50  mg Oral BID   pantoprazole  40 mg Oral Daily   patiromer  16.8 g Oral Daily   predniSONE  40 mg Oral Q breakfast   pregabalin  75 mg Oral Daily   acetaminophen **OR** acetaminophen, chlorpheniramine-HYDROcodone, guaiFENesin, hydrOXYzine, nicotine, ondansetron **OR** ondansetron (ZOFRAN) IV, QUEtiapine, senna-docusate  Assessment/ Plan:  Ms. AN LANNAN is a 54 y.o.  female with past medical conditions including COPD, hypertension, and end-stage renal disease on hemodialysis.  Patient presents to the emergency department complaining of shortness of breath and wheeze and has been admitted for Dyspnea on exertion [R06.09] Hypoxia [R09.02] COPD exacerbation (HCC) [J44.1] Acute respiratory failure with hypoxia (HCC) [J96.01]  [facility] MWF Lt AVG  End stage renal disease on hemodialysis.  Patient received treatment yesterday, UF 1.8 L achieved.  Patient terminated treatment early due to cramping.  Will perform scheduled dialysis today to maintain outpatient schedule.  2. Acute respiratory failure due to RSV. Chest xray shows vascular congestion.  Remains on 3 L supplemental oxygen.  Cough present.  Continue supportive measures per primary team.  3. Anemia of chronic kidney disease Lab Results  Component Value Date   HGB 9.5 (L) 01/23/2024    Hgb at goal. Patient receives Mircera at outpatient clinic.   4. Secondary Hyperparathyroidism: with outpatient labs: PTH 2952, phosphorus 6.7, calcium 7.9 on 01/13/24.   Lab Results  Component Value Date   PTH 73 (H) 01/03/2019   PTH Comment 01/03/2019   CALCIUM 7.4 (L) 01/23/2024   CAION 0.87 (LL) 12/06/2022   PHOS 3.9 04/13/2022    Corrected calcium 7.7.  Phosphorus acceptable.  Will monitor bone minerals.   LOS: 2 Jahi Roza 1/24/202511:50 AM

## 2024-01-24 NOTE — Progress Notes (Addendum)
Received patient in W/C to unit.  Alert and oriented.  Informed consent signed and in chart.   TX duration: 2.5 hours Patient scheduled for 3 hour treatment. Signed AMA to come off Tx 30 minutes early.  Patient tolerated well.  Transported back to the room  Alert, without acute distress.  Hand-off given to patient's nurse Ernesto Rutherford  Access used: LUE AVF Access issues: none  Total UF removed: 0 Medication(s) given: 0  Post HD VS: BP 117/66 P 102 resp 22 o2 Sat 100% oxygen 4LPM N/C  Post HD weight: 93.1kg  Freddie Breech, RN [facility]

## 2024-01-24 NOTE — ED Notes (Signed)
Pt up to bathroom, ambulatory w/out assistance. Gait steady

## 2024-01-25 DIAGNOSIS — Z992 Dependence on renal dialysis: Secondary | ICD-10-CM | POA: Diagnosis not present

## 2024-01-25 DIAGNOSIS — J441 Chronic obstructive pulmonary disease with (acute) exacerbation: Secondary | ICD-10-CM | POA: Diagnosis not present

## 2024-01-25 DIAGNOSIS — N186 End stage renal disease: Secondary | ICD-10-CM | POA: Diagnosis not present

## 2024-01-25 DIAGNOSIS — J121 Respiratory syncytial virus pneumonia: Secondary | ICD-10-CM | POA: Diagnosis not present

## 2024-01-25 LAB — HEPATITIS B SURFACE ANTIBODY, QUANTITATIVE: Hep B S AB Quant (Post): 10 m[IU]/mL — ABNORMAL LOW

## 2024-01-25 MED ORDER — IPRATROPIUM-ALBUTEROL 0.5-2.5 (3) MG/3ML IN SOLN
3.0000 mL | Freq: Four times a day (QID) | RESPIRATORY_TRACT | Status: DC
  Administered 2024-01-25 – 2024-01-26 (×4): 3 mL via RESPIRATORY_TRACT
  Filled 2024-01-25 (×5): qty 3

## 2024-01-25 MED ORDER — METHYLPREDNISOLONE SODIUM SUCC 40 MG IJ SOLR
40.0000 mg | Freq: Two times a day (BID) | INTRAMUSCULAR | Status: DC
  Administered 2024-01-25 – 2024-01-26 (×3): 40 mg via INTRAVENOUS
  Filled 2024-01-25 (×3): qty 1

## 2024-01-25 MED ORDER — LACTULOSE 10 GM/15ML PO SOLN
20.0000 g | Freq: Once | ORAL | Status: AC
  Administered 2024-01-25: 20 g via ORAL
  Filled 2024-01-25: qty 30

## 2024-01-25 MED ORDER — NICOTINE 21 MG/24HR TD PT24
21.0000 mg | MEDICATED_PATCH | Freq: Every day | TRANSDERMAL | Status: DC
  Filled 2024-01-25 (×2): qty 1

## 2024-01-25 MED ORDER — SODIUM POLYSTYRENE SULFONATE 15 GM/60ML CO SUSP
45.0000 g | Freq: Once | Status: DC
  Filled 2024-01-25: qty 180

## 2024-01-25 NOTE — Progress Notes (Signed)
[facility]  ROUNDING NOTE   Subjective:   Janet Olson is a 54 y.o. female with past medical conditions including COPD, hypertension, and end-stage renal disease on hemodialysis.  Patient presents to the emergency department complaining of shortness of breath and wheeze and has been admitted for Dyspnea on exertion [R06.09] Hypoxia [R09.02] COPD exacerbation (HCC) [J44.1] Acute respiratory failure with hypoxia (HCC) [J96.01]  Patient is known to our practice and receives outpatient dialysis treatments at [facility] on a MWF schedule, supervised by Dr. Cherylann Ratel.    Patient seen sitting up on side of bed Able to ambulate in the room Continues to have shortness of breath with cough.    Objective:  Vital signs in last 24 hours:  Temp:  [98.1 F (36.7 C)-99.5 F (37.5 C)] 98.2 F (36.8 C) (01/25 0812) Pulse Rate:  [84-103] 87 (01/25 0812) Resp:  [13-24] 18 (01/25 0812) BP: (92-145)/(58-79) 116/75 (01/25 0812) SpO2:  [93 %-100 %] 100 % (01/25 0812) Weight:  [92.9 kg-93.1 kg] 93.1 kg (01/24 1814)  Weight change: -1.9 kg Filed Weights   01/23/24 1016 01/24/24 1412 01/24/24 1814  Weight: 94.8 kg 92.9 kg 93.1 kg    Intake/Output: I/O last 3 completed shifts: In: -  Out: 0.2 [Other:0.2]   Intake/Output this shift:  No intake/output data recorded.  Physical Exam: General: NAD  Head: Normocephalic, atraumatic. Moist oral mucosal membranes  Eyes: Anicteric  Neck: Supple, trachea midline  Lungs:  Clear to auscultation, dry cough  Heart: Regular rate and rhythm  Abdomen:  Soft, nontender  Extremities:  No peripheral edema.  Neurologic: Alert, moving all four extremities  Skin: No lesions  Access: Lt AVG    Basic Metabolic Panel: Recent Labs  Lab 01/22/24 1633 01/23/24 0634 01/24/24 1520  NA 140 139 137  K 4.6 5.4* 5.8*  CL 99 99 97*  CO2 26 22 24   GLUCOSE 103* 143* 106*  BUN 62* 77* 69*  CREATININE 11.73* 12.52* 10.47*  CALCIUM 8.3* 7.4*  7.8*  PHOS  --   --  5.9*    Liver Function Tests: Recent Labs  Lab 01/22/24 1633 01/24/24 1520  AST 23  --   ALT 21  --   ALKPHOS 175*  --   BILITOT 1.3*  --   PROT 7.1  --   ALBUMIN 3.6 3.5   No results for input(s): "LIPASE", "AMYLASE" in the last 168 hours. No results for input(s): "AMMONIA" in the last 168 hours.  CBC: Recent Labs  Lab 01/22/24 1633 01/23/24 0634 01/24/24 1520  WBC 5.5 5.2 6.1  NEUTROABS 3.4  --  4.8  HGB 10.1* 9.5* 9.7*  HCT 32.0* 31.0* 31.6*  MCV 95.5 95.7 94.0  PLT 174 166 168    Cardiac Enzymes: No results for input(s): "CKTOTAL", "CKMB", "CKMBINDEX", "TROPONINI" in the last 168 hours.  BNP: Invalid input(s): "POCBNP"  CBG: No results for input(s): "GLUCAP" in the last 168 hours.  Microbiology: Results for orders placed or performed during the hospital encounter of 01/22/24  Blood culture (routine single)     Status: None (Preliminary result)   Collection Time: 01/22/24  4:34 PM   Specimen: BLOOD  Result Value Ref Range Status   Specimen Description BLOOD BLOOD RIGHT ARM  Final   Special Requests   Final    BOTTLES DRAWN AEROBIC AND ANAEROBIC Blood Culture results may not be optimal due to an inadequate volume of blood received in culture bottles   Culture   Final  NO GROWTH 3 DAYS Performed at [facility], [address] [place], [place] [address]    Report Status PENDING  Incomplete  Resp panel by RT-PCR (RSV, Flu A&B, Covid) Anterior Nasal Swab     Status: Abnormal   Collection Time: 01/22/24  5:51 PM   Specimen: Anterior Nasal Swab  Result Value Ref Range Status   SARS Coronavirus 2 by RT PCR NEGATIVE NEGATIVE Final    Comment: (NOTE) SARS-CoV-2 target nucleic acids are NOT DETECTED.  The SARS-CoV-2 RNA is generally detectable in upper respiratory specimens during the acute phase of infection. The lowest concentration of SARS-CoV-2 viral copies this assay can detect is 138 copies/mL. A negative result does  not preclude SARS-Cov-2 infection and should not be used as the sole basis for treatment or other patient management decisions. A negative result may occur with  improper specimen collection/handling, submission of specimen other than nasopharyngeal swab, presence of viral mutation(s) within the areas targeted by this assay, and inadequate number of viral copies(<138 copies/mL). A negative result must be combined with clinical observations, patient history, and epidemiological information. The expected result is Negative.  Fact Sheet for Patients:  BloggerCourse.com  Fact Sheet for Healthcare Providers:  SeriousBroker.it  This test is no t yet approved or cleared by the [place] FDA and  has been authorized for detection and/or diagnosis of SARS-CoV-2 by FDA under an Emergency Use Authorization (EUA). This EUA will remain  in effect (meaning this test can be used) for the duration of the COVID-19 declaration under Section 564(b)(1) of the Act, 21 U.S.C.section 360bbb-3(b)(1), unless the authorization is terminated  or revoked sooner.       Influenza A by PCR NEGATIVE NEGATIVE Final   Influenza B by PCR NEGATIVE NEGATIVE Final    Comment: (NOTE) The Xpert Xpress SARS-CoV-2/FLU/RSV plus assay is intended as an aid in the diagnosis of influenza from Nasopharyngeal swab specimens and should not be used as a sole basis for treatment. Nasal washings and aspirates are unacceptable for Xpert Xpress SARS-CoV-2/FLU/RSV testing.  Fact Sheet for Patients: BloggerCourse.com  Fact Sheet for Healthcare Providers: SeriousBroker.it  This test is not yet approved or cleared by the [place] FDA and has been authorized for detection and/or diagnosis of SARS-CoV-2 by FDA under an Emergency Use Authorization (EUA). This EUA will remain in effect (meaning this test can be used) for the  duration of the COVID-19 declaration under Section 564(b)(1) of the Act, 21 U.S.C. section 360bbb-3(b)(1), unless the authorization is terminated or revoked.     Resp Syncytial Virus by PCR POSITIVE (A) NEGATIVE Final    Comment: (NOTE) Fact Sheet for Patients: BloggerCourse.com  Fact Sheet for Healthcare Providers: SeriousBroker.it  This test is not yet approved or cleared by the [place] FDA and has been authorized for detection and/or diagnosis of SARS-CoV-2 by FDA under an Emergency Use Authorization (EUA). This EUA will remain in effect (meaning this test can be used) for the duration of the COVID-19 declaration under Section 564(b)(1) of the Act, 21 U.S.C. section 360bbb-3(b)(1), unless the authorization is terminated or revoked.  Performed at [facility], [address] [place], [place] [address]     Coagulation Studies: Recent Labs    01/22/24 1633  LABPROT 18.2*  INR 1.5*    Urinalysis: No results for input(s): "COLORURINE", "LABSPEC", "PHURINE", "GLUCOSEU", "HGBUR", "BILIRUBINUR", "KETONESUR", "PROTEINUR", "UROBILINOGEN", "NITRITE", "LEUKOCYTESUR" in the last 72 hours.  Invalid input(s): "APPERANCEUR"    Imaging: No results found.  Medications:     apixaban  5 mg Oral BID   benzonatate  100 mg Oral TID   budesonide  0.5 mg Nebulization BID   Chlorhexidine Gluconate Cloth  6 each Topical Q0600   cyclobenzaprine  5 mg Oral TID   furosemide  40 mg Oral Daily   ipratropium-albuterol  3 mL Nebulization Q6H   lanthanum  1,000 mg Oral TID WC   lumateperone tosylate  10.5 mg Oral Daily   methylPREDNISolone (SOLU-MEDROL) injection  40 mg Intravenous Q12H   metoprolol tartrate  50 mg Oral BID   midodrine  30 mg Oral See admin instructions   multivitamin  1 tablet Oral Daily   nicotine  21 mg Transdermal Daily   pantoprazole  40 mg Oral Daily   patiromer  16.8 g Oral Daily   pregabalin   75 mg Oral Daily   Vitamin D (Ergocalciferol)  50,000 Units Oral Weekly   acetaminophen **OR** acetaminophen, chlorpheniramine-HYDROcodone, guaiFENesin, hydrOXYzine, lidocaine-prilocaine, ondansetron **OR** ondansetron (ZOFRAN) IV, QUEtiapine, senna-docusate  Assessment/ Plan:  [name] is a 54 y.o.  female with past medical conditions including COPD, hypertension, and end-stage renal disease on hemodialysis.  Patient presents to the emergency department complaining of shortness of breath and wheeze and has been admitted for Dyspnea on exertion [R06.09] Hypoxia [R09.02] COPD exacerbation (HCC) [J44.1] Acute respiratory failure with hypoxia (HCC) [J96.01]  [facility] MWF Lt AVG  End stage renal disease on hemodialysis.  Dialysis received yesterday to maintain outpatient schedule. No UF. Next treatment scheduled for Monday.  2. Acute respiratory failure due to RSV. Chest xray shows vascular congestion.  Remains on 3 L supplemental oxygen.  Cough present.  Inhalers and supplemental oxygen provided.   3. Anemia of chronic kidney disease Lab Results  Component Value Date   HGB 9.7 (L) 01/24/2024    Patient receives Mircera at outpatient clinic.  Will continue to monitor bone minerals during this admission  4. Secondary Hyperparathyroidism: with outpatient labs: PTH 2952, phosphorus 6.7, calcium 7.9 on 01/13/24.   Lab Results  Component Value Date   PTH 73 (H) 01/03/2019   PTH Comment 01/03/2019   CALCIUM 7.8 (L) 01/24/2024   CAION 0.87 (LL) 12/06/2022   PHOS 5.9 (H) 01/24/2024    Phosphorus acceptable.  Will monitor bone minerals.   LOS: 3 Merrie Epler 1/25/20251:31 PM

## 2024-01-25 NOTE — Progress Notes (Signed)
Pt noted to be smoking cigarettes in bathroom by NT. Removed cigarettes and lighter from patients room. Placed in patient bin for return upon discharge. Educated patient on dangers of smoking with O2. Provided cessation eduction as well. Notified MD and requested Nicotine patch.

## 2024-01-25 NOTE — Progress Notes (Signed)
  Progress Note   Patient: Janet Olson [facility] JXB:[id number] DOB: November 11, 1970 DOA: 01/22/2024     3 DOS: the patient was seen and examined on 01/25/2024   Brief hospital course: Ms. Helia Haese is a 54 year old female with history of hypertension, end-stage renal disease on hemodialysis, COPD, continued tobacco use, no prior oxygen supplementation, who presents emergency department for chief concerns of shortness of breath and wheezing. Her oxygen saturation has dropped down to 86%, she was placed on 3 L oxygen. Chest x-ray showed vascular congestion.  She was also positive for RSV. Patient was started on IV steroids and transition to oral prednisone for COPD exacerbation.     Principal Problem:   COPD with acute exacerbation (HCC) Active Problems:   ESRD on dialysis (HCC)   RSV (respiratory syncytial virus pneumonia)   Elevated troponin   Cough   Acute respiratory failure with hypoxia (HCC)   Tobacco use   Tobacco dependence syndrome   Dyspnea on exertion   Assessment and Plan: Acute hypoxemic respiratory failure. RSV infection. * COPD with acute exacerbation (HCC) Worsening short of breath, she also developed hypoxemia.  This is a multifactorial, due to RSV infection with COPD exacerbation, end-stage renal disease with pulmonary edema. Patient has received IV steroids, will continue oral prednisone. Chest x-ray did not show any evidence of pneumonia, procalcitonin level 1.28 with end-stage renal disease.  Added 3 days of Zithromax.  Continued scheduled bronchodilators. Patient had a worsening wheezing today, still has large amount mucus production.  I changed to steroid to IV Solu-Medrol twice a day again.   ESRD on dialysis (HCC) Hyperkalemia. Anemia of chronic disease. Potassium was 5.8, that it was prior to the dialysis.  Patient had dialyzed again yesterday, followed by nephrology.  Veltassa was continued daily.   Elevated troponin Suspect secondary to demand ischemia in  setting of acute hypoxic respiratory failure due to COPD exacerbation in setting of RSV infection   Tobacco dependence syndrome Advised to quit.   Diabetes with BMI 33.73.  Diet and excise.          Subjective:  Patient has a worsening short of breath and wheezing today.  Physical Exam: Vitals:   01/25/24 0007 01/25/24 0406 01/25/24 0737 01/25/24 0812  BP: (!) 92/58 111/72  116/75  Pulse: 86 84 94 87  Resp: 16 20 20 18   Temp: 98.7 F (37.1 C) 98.4 F (36.9 C)  98.2 F (36.8 C)  TempSrc: Oral Oral    SpO2: 97% 98% 99% 100%  Weight:      Height:       General exam: Appears calm and comfortable  Respiratory system: Decreased breathing sounds with wheezes. Respiratory effort normal. Cardiovascular system: S1 & S2 heard, RRR. No JVD, murmurs, rubs, gallops or clicks. No pedal edema. Gastrointestinal system: Abdomen is nondistended, soft and nontender. No organomegaly or masses felt. Normal bowel sounds heard. Central nervous system: Alert and oriented. No focal neurological deficits. Extremities: Symmetric 5 x 5 power. Skin: No rashes, lesions or ulcers Psychiatry: Judgement and insight appear normal. Mood & affect appropriate.    Data Reviewed:  Lab results reviewed.  Family Communication: None  Disposition: Status is: Inpatient Remains inpatient appropriate because: Severity of disease, IV treatment.     Time spent: 35 minutes  Author: Marrion Coy, MD 01/25/2024 12:33 PM  For on call review [email or web address].

## 2024-01-25 NOTE — Progress Notes (Incomplete)
[facility]  ROUNDING NOTE   Subjective:   Janet Olson is a 54 y.o. female with past medical conditions including COPD, hypertension, and end-stage renal disease on hemodialysis.  Patient presents to the emergency department complaining of shortness of breath and wheeze and has been admitted for Dyspnea on exertion [R06.09] Hypoxia [R09.02] COPD exacerbation (HCC) [J44.1] Acute respiratory failure with hypoxia (HCC) [J96.01]  Patient is known to our practice and receives outpatient dialysis treatments at [facility] on a MWF schedule, supervised by Dr. Cherylann Ratel.       Objective:  Vital signs in last 24 hours:  Temp:  [98.1 F (36.7 C)-99.5 F (37.5 C)] 98.2 F (36.8 C) (01/25 0812) Pulse Rate:  [84-103] 87 (01/25 0812) Resp:  [13-24] 18 (01/25 0812) BP: (92-145)/(58-79) 116/75 (01/25 0812) SpO2:  [93 %-100 %] 100 % (01/25 0812) Weight:  [92.9 kg-93.1 kg] 93.1 kg (01/24 1814)  Weight change: -1.9 kg Filed Weights   01/23/24 1016 01/24/24 1412 01/24/24 1814  Weight: 94.8 kg 92.9 kg 93.1 kg    Intake/Output: I/O last 3 completed shifts: In: -  Out: 0.2 [Other:0.2]   Intake/Output this shift:  No intake/output data recorded.  Physical Exam: General: NAD  Head: Normocephalic, atraumatic. Moist oral mucosal membranes  Eyes: Anicteric  Neck: Supple, trachea midline  Lungs:  Clear to auscultation, dry cough  Heart: Regular rate and rhythm  Abdomen:  Soft, nontender  Extremities:  No peripheral edema.  Neurologic: Alert, moving all four extremities  Skin: No lesions  Access: Lt AVG    Basic Metabolic Panel: Recent Labs  Lab 01/22/24 1633 01/23/24 0634 01/24/24 1520  NA 140 139 137  K 4.6 5.4* 5.8*  CL 99 99 97*  CO2 26 22 24   GLUCOSE 103* 143* 106*  BUN 62* 77* 69*  CREATININE 11.73* 12.52* 10.47*  CALCIUM 8.3* 7.4* 7.8*  PHOS  --   --  5.9*    Liver Function Tests: Recent Labs  Lab 01/22/24 1633 01/24/24 1520  AST 23  --    ALT 21  --   ALKPHOS 175*  --   BILITOT 1.3*  --   PROT 7.1  --   ALBUMIN 3.6 3.5   No results for input(s): "LIPASE", "AMYLASE" in the last 168 hours. No results for input(s): "AMMONIA" in the last 168 hours.  CBC: Recent Labs  Lab 01/22/24 1633 01/23/24 0634 01/24/24 1520  WBC 5.5 5.2 6.1  NEUTROABS 3.4  --  4.8  HGB 10.1* 9.5* 9.7*  HCT 32.0* 31.0* 31.6*  MCV 95.5 95.7 94.0  PLT 174 166 168    Cardiac Enzymes: No results for input(s): "CKTOTAL", "CKMB", "CKMBINDEX", "TROPONINI" in the last 168 hours.  BNP: Invalid input(s): "POCBNP"  CBG: No results for input(s): "GLUCAP" in the last 168 hours.  Microbiology: Results for orders placed or performed during the hospital encounter of 01/22/24  Blood culture (routine single)     Status: None (Preliminary result)   Collection Time: 01/22/24  4:34 PM   Specimen: BLOOD  Result Value Ref Range Status   Specimen Description BLOOD BLOOD RIGHT ARM  Final   Special Requests   Final    BOTTLES DRAWN AEROBIC AND ANAEROBIC Blood Culture results may not be optimal due to an inadequate volume of blood received in culture bottles   Culture   Final    NO GROWTH 3 DAYS Performed at [facility], [address] [place], [place] [address]    Report Status  PENDING  Incomplete  Resp panel by RT-PCR (RSV, Flu A&B, Covid) Anterior Nasal Swab     Status: Abnormal   Collection Time: 01/22/24  5:51 PM   Specimen: Anterior Nasal Swab  Result Value Ref Range Status   SARS Coronavirus 2 by RT PCR NEGATIVE NEGATIVE Final    Comment: (NOTE) SARS-CoV-2 target nucleic acids are NOT DETECTED.  The SARS-CoV-2 RNA is generally detectable in upper respiratory specimens during the acute phase of infection. The lowest concentration of SARS-CoV-2 viral copies this assay can detect is 138 copies/mL. A negative result does not preclude SARS-Cov-2 infection and should not be used as the sole basis for treatment or other patient management  decisions. A negative result may occur with  improper specimen collection/handling, submission of specimen other than nasopharyngeal swab, presence of viral mutation(s) within the areas targeted by this assay, and inadequate number of viral copies(<138 copies/mL). A negative result must be combined with clinical observations, patient history, and epidemiological information. The expected result is Negative.  Fact Sheet for Patients:  BloggerCourse.com  Fact Sheet for Healthcare Providers:  SeriousBroker.it  This test is no t yet approved or cleared by the [place] FDA and  has been authorized for detection and/or diagnosis of SARS-CoV-2 by FDA under an Emergency Use Authorization (EUA). This EUA will remain  in effect (meaning this test can be used) for the duration of the COVID-19 declaration under Section 564(b)(1) of the Act, 21 U.S.C.section 360bbb-3(b)(1), unless the authorization is terminated  or revoked sooner.       Influenza A by PCR NEGATIVE NEGATIVE Final   Influenza B by PCR NEGATIVE NEGATIVE Final    Comment: (NOTE) The Xpert Xpress SARS-CoV-2/FLU/RSV plus assay is intended as an aid in the diagnosis of influenza from Nasopharyngeal swab specimens and should not be used as a sole basis for treatment. Nasal washings and aspirates are unacceptable for Xpert Xpress SARS-CoV-2/FLU/RSV testing.  Fact Sheet for Patients: BloggerCourse.com  Fact Sheet for Healthcare Providers: SeriousBroker.it  This test is not yet approved or cleared by the [place] FDA and has been authorized for detection and/or diagnosis of SARS-CoV-2 by FDA under an Emergency Use Authorization (EUA). This EUA will remain in effect (meaning this test can be used) for the duration of the COVID-19 declaration under Section 564(b)(1) of the Act, 21 U.S.C. section 360bbb-3(b)(1), unless the  authorization is terminated or revoked.     Resp Syncytial Virus by PCR POSITIVE (A) NEGATIVE Final    Comment: (NOTE) Fact Sheet for Patients: BloggerCourse.com  Fact Sheet for Healthcare Providers: SeriousBroker.it  This test is not yet approved or cleared by the [place] FDA and has been authorized for detection and/or diagnosis of SARS-CoV-2 by FDA under an Emergency Use Authorization (EUA). This EUA will remain in effect (meaning this test can be used) for the duration of the COVID-19 declaration under Section 564(b)(1) of the Act, 21 U.S.C. section 360bbb-3(b)(1), unless the authorization is terminated or revoked.  Performed at [facility], [address] [place], [place] [address]     Coagulation Studies: Recent Labs    01/22/24 1633  LABPROT 18.2*  INR 1.5*    Urinalysis: No results for input(s): "COLORURINE", "LABSPEC", "PHURINE", "GLUCOSEU", "HGBUR", "BILIRUBINUR", "KETONESUR", "PROTEINUR", "UROBILINOGEN", "NITRITE", "LEUKOCYTESUR" in the last 72 hours.  Invalid input(s): "APPERANCEUR"    Imaging: No results found.    Medications:     apixaban  5 mg Oral BID   benzonatate  100 mg Oral TID  budesonide  0.5 mg Nebulization BID   Chlorhexidine Gluconate Cloth  6 each Topical Q0600   cyclobenzaprine  5 mg Oral TID   furosemide  40 mg Oral Daily   ipratropium-albuterol  3 mL Nebulization Q6H   lanthanum  1,000 mg Oral TID WC   lumateperone tosylate  10.5 mg Oral Daily   methylPREDNISolone (SOLU-MEDROL) injection  40 mg Intravenous Q12H   metoprolol tartrate  50 mg Oral BID   midodrine  30 mg Oral See admin instructions   multivitamin  1 tablet Oral Daily   nicotine  21 mg Transdermal Daily   pantoprazole  40 mg Oral Daily   patiromer  16.8 g Oral Daily   pregabalin  75 mg Oral Daily   Vitamin D (Ergocalciferol)  50,000 Units Oral Weekly   acetaminophen **OR** acetaminophen,  chlorpheniramine-HYDROcodone, guaiFENesin, hydrOXYzine, lidocaine-prilocaine, ondansetron **OR** ondansetron (ZOFRAN) IV, QUEtiapine, senna-docusate  Assessment/ Plan:  Janet Olson is a 54 y.o.  female with past medical conditions including COPD, hypertension, and end-stage renal disease on hemodialysis.  Patient presents to the emergency department complaining of shortness of breath and wheeze and has been admitted for Dyspnea on exertion [R06.09] Hypoxia [R09.02] COPD exacerbation (HCC) [J44.1] Acute respiratory failure with hypoxia (HCC) [J96.01]  [facility] MWF Lt AVG  End stage renal disease on hemodialysis.  Patient received treatment yesterday, UF 1.8 L achieved.  Patient terminated treatment early due to cramping.  Will perform scheduled dialysis today to maintain outpatient schedule.  2. Acute respiratory failure due to RSV. Chest xray shows vascular congestion.  Remains on 3 L supplemental oxygen.  Cough present.  Continue supportive measures per primary team.  3. Anemia of chronic kidney disease Lab Results  Component Value Date   HGB 9.7 (L) 01/24/2024    Hgb at goal. Patient receives Mircera at outpatient clinic.   4. Secondary Hyperparathyroidism: with outpatient labs: PTH 2952, phosphorus 6.7, calcium 7.9 on 01/13/24.   Lab Results  Component Value Date   PTH 73 (H) 01/03/2019   PTH Comment 01/03/2019   CALCIUM 7.8 (L) 01/24/2024   CAION 0.87 (LL) 12/06/2022   PHOS 5.9 (H) 01/24/2024    Corrected calcium 7.7.  Phosphorus acceptable.  Will monitor bone minerals.   LOS: 3 Idania Desouza 1/25/20251:25 PM

## 2024-01-26 DIAGNOSIS — N186 End stage renal disease: Secondary | ICD-10-CM | POA: Diagnosis not present

## 2024-01-26 DIAGNOSIS — J441 Chronic obstructive pulmonary disease with (acute) exacerbation: Secondary | ICD-10-CM | POA: Diagnosis not present

## 2024-01-26 DIAGNOSIS — J9601 Acute respiratory failure with hypoxia: Secondary | ICD-10-CM | POA: Diagnosis not present

## 2024-01-26 DIAGNOSIS — J121 Respiratory syncytial virus pneumonia: Secondary | ICD-10-CM | POA: Diagnosis not present

## 2024-01-26 LAB — BASIC METABOLIC PANEL
Anion gap: 16 — ABNORMAL HIGH (ref 5–15)
BUN: 80 mg/dL — ABNORMAL HIGH (ref 6–20)
CO2: 23 mmol/L (ref 22–32)
Calcium: 7.1 mg/dL — ABNORMAL LOW (ref 8.9–10.3)
Chloride: 99 mmol/L (ref 98–111)
Creatinine, Ser: 9.94 mg/dL — ABNORMAL HIGH (ref 0.44–1.00)
GFR, Estimated: 4 mL/min — ABNORMAL LOW (ref >60)
Glucose, Bld: 160 mg/dL — ABNORMAL HIGH (ref 70–99)
Potassium: 4.9 mmol/L (ref 3.5–5.1)
Sodium: 138 mmol/L (ref 135–145)

## 2024-01-26 MED ORDER — NICOTINE 21 MG/24HR TD PT24: 21.0000 mg | MEDICATED_PATCH | Freq: Every day | TRANSDERMAL | 0 refills | Status: AC

## 2024-01-26 MED ORDER — HYDROCOD POLI-CHLORPHE POLI ER 10-8 MG/5ML PO SUER: 5.0000 mL | Freq: Two times a day (BID) | ORAL | 0 refills | Status: AC | PRN

## 2024-01-26 MED ORDER — PREDNISONE 20 MG PO TABS: ORAL_TABLET | ORAL | 0 refills | Status: AC

## 2024-01-26 NOTE — Progress Notes (Signed)
SATURATION QUALIFICATIONS: (This note is used to comply with regulatory documentation for home oxygen)  Patient Saturations on Room Air at Rest = 90%  Patient Saturations on Room Air while Ambulating = 70%  Patient Saturations on 1 Liters of oxygen while Ambulating = 95%  Please briefly explain why patient needs home oxygen: to maintain oxygen saturations above 88%

## 2024-01-26 NOTE — TOC Transition Note (Addendum)
Transition of Care [facility]) - Discharge Note   Patient Details  Name: Janet Olson MRN: [id number] Date of Birth: 1970-07-24  [facility] CM/SW Contact:  Rodney Langton, RN Phone Number: 01/26/2024, 12:24 PM   Clinical Narrative:     Patient has discharge orders, will now need home O2.  Ada with [employer] notified, will be delivered to bedside. Spoke with son, he is aware of discharge and will provide transportation home.   Update @ 1600:  Spoke with Ada at [employer] again, she continues to say driver is on the way to hospital for O2 delivery.  She is aware that patient is frustrated and ready to go, notified that patient will have regular home O2 set up scheduled for delivery tomorrow.  Will still need O2 to be delivered so patient has DME through the evening/night and and tomorrow until delivery.  Final next level of care: Home/Self Care Barriers to Discharge: Barriers Resolved   Patient Goals and CMS Choice Patient states their goals for this hospitalization and ongoing recovery are:: Home          Discharge Placement                       Discharge Plan and Services Additional resources added to the After Visit Summary for       Post Acute Care Choice: NA          DME Arranged: Oxygen DME Agency: [employer] Date DME Agency Contacted: 01/26/24 Time DME Agency Contacted: 1224 Representative spoke with at DME Agency: Ada            Social Drivers of Health (SDOH) Interventions SDOH Screenings   Food Insecurity: No Food Insecurity (01/23/2024)  Housing: Low Risk  (01/23/2024)  Transportation Needs: No Transportation Needs (01/23/2024)  Utilities: Not At Risk (01/23/2024)  Financial Resource Strain: Low Risk  (01/09/2024)   Received from [facility]  Social Connections: Moderately Integrated (01/23/2024)  Tobacco Use: High Risk (01/23/2024)     Readmission Risk Interventions    01/24/2024    1:06 PM  Readmission Risk Prevention Plan   Transportation Screening Complete  PCP or Specialist Appt within 3-5 Days Complete  Social Work Consult for Recovery Care Planning/Counseling Complete  Palliative Care Screening Not Applicable  Medication Review Oceanographer) Complete

## 2024-01-26 NOTE — Progress Notes (Signed)
[facility]  ROUNDING NOTE   Subjective:   Janet Olson is a 54 y.o. female with past medical conditions including COPD, hypertension, and end-stage renal disease on hemodialysis.  Patient presents to the emergency department complaining of shortness of breath and wheeze and has been admitted for Dyspnea on exertion [R06.09] Hypoxia [R09.02] COPD exacerbation (HCC) [J44.1] Acute respiratory failure with hypoxia (HCC) [J96.01]  Patient is known to our practice and receives outpatient dialysis treatments at [facility] on a MWF schedule, supervised by Dr. Cherylann Ratel.    Patient seen sitting at side of bed Alert and oriented States she was able to ambulate today without shortness of breath or cough States she gets more short of breath with cough while sitting still having a conversation Intermittent dry cough present Currently on room air, oxygen saturations 92%  Objective:  Vital signs in last 24 hours:  Temp:  [97.8 F (36.6 C)-98.6 F (37 C)] 97.8 F (36.6 C) (01/26 0847) Pulse Rate:  [80-87] 86 (01/26 0847) Resp:  [16-18] 16 (01/26 0847) BP: (101-110)/(65-71) 105/65 (01/26 0847) SpO2:  [93 %-100 %] 97 % (01/26 0847)  Weight change:  Filed Weights   01/23/24 1016 01/24/24 1412 01/24/24 1814  Weight: 94.8 kg 92.9 kg 93.1 kg    Intake/Output: No intake/output data recorded.   Intake/Output this shift:  No intake/output data recorded.  Physical Exam: General: NAD  Head: Normocephalic, atraumatic. Moist oral mucosal membranes  Eyes: Anicteric  Neck: Supple, trachea midline  Lungs:  Clear to auscultation, dry cough  Heart: Regular rate and rhythm  Abdomen:  Soft, nontender  Extremities:  No peripheral edema.  Neurologic: Alert, moving all four extremities  Skin: No lesions  Access: Lt AVG    Basic Metabolic Panel: Recent Labs  Lab 01/22/24 1633 01/23/24 0634 01/24/24 1520 01/26/24 0516  NA 140 139 137 138  K 4.6 5.4* 5.8* 4.9  CL 99 99  97* 99  CO2 26 22 24 23   GLUCOSE 103* 143* 106* 160*  BUN 62* 77* 69* 80*  CREATININE 11.73* 12.52* 10.47* 9.94*  CALCIUM 8.3* 7.4* 7.8* 7.1*  PHOS  --   --  5.9*  --     Liver Function Tests: Recent Labs  Lab 01/22/24 1633 01/24/24 1520  AST 23  --   ALT 21  --   ALKPHOS 175*  --   BILITOT 1.3*  --   PROT 7.1  --   ALBUMIN 3.6 3.5   No results for input(s): "LIPASE", "AMYLASE" in the last 168 hours. No results for input(s): "AMMONIA" in the last 168 hours.  CBC: Recent Labs  Lab 01/22/24 1633 01/23/24 0634 01/24/24 1520  WBC 5.5 5.2 6.1  NEUTROABS 3.4  --  4.8  HGB 10.1* 9.5* 9.7*  HCT 32.0* 31.0* 31.6*  MCV 95.5 95.7 94.0  PLT 174 166 168    Cardiac Enzymes: No results for input(s): "CKTOTAL", "CKMB", "CKMBINDEX", "TROPONINI" in the last 168 hours.  BNP: Invalid input(s): "POCBNP"  CBG: No results for input(s): "GLUCAP" in the last 168 hours.  Microbiology: Results for orders placed or performed during the hospital encounter of 01/22/24  Blood culture (routine single)     Status: None (Preliminary result)   Collection Time: 01/22/24  4:34 PM   Specimen: BLOOD  Result Value Ref Range Status   Specimen Description BLOOD BLOOD RIGHT ARM  Final   Special Requests   Final    BOTTLES DRAWN AEROBIC AND ANAEROBIC Blood Culture results may not  be optimal due to an inadequate volume of blood received in culture bottles   Culture   Final    NO GROWTH 4 DAYS Performed at [facility], [address] [place], [place] [address]    Report Status PENDING  Incomplete  Resp panel by RT-PCR (RSV, Flu A&B, Covid) Anterior Nasal Swab     Status: Abnormal   Collection Time: 01/22/24  5:51 PM   Specimen: Anterior Nasal Swab  Result Value Ref Range Status   SARS Coronavirus 2 by RT PCR NEGATIVE NEGATIVE Final    Comment: (NOTE) SARS-CoV-2 target nucleic acids are NOT DETECTED.  The SARS-CoV-2 RNA is generally detectable in upper respiratory specimens during  the acute phase of infection. The lowest concentration of SARS-CoV-2 viral copies this assay can detect is 138 copies/mL. A negative result does not preclude SARS-Cov-2 infection and should not be used as the sole basis for treatment or other patient management decisions. A negative result may occur with  improper specimen collection/handling, submission of specimen other than nasopharyngeal swab, presence of viral mutation(s) within the areas targeted by this assay, and inadequate number of viral copies(<138 copies/mL). A negative result must be combined with clinical observations, patient history, and epidemiological information. The expected result is Negative.  Fact Sheet for Patients:  BloggerCourse.com  Fact Sheet for Healthcare Providers:  SeriousBroker.it  This test is no t yet approved or cleared by the [place] FDA and  has been authorized for detection and/or diagnosis of SARS-CoV-2 by FDA under an Emergency Use Authorization (EUA). This EUA will remain  in effect (meaning this test can be used) for the duration of the COVID-19 declaration under Section 564(b)(1) of the Act, 21 U.S.C.section 360bbb-3(b)(1), unless the authorization is terminated  or revoked sooner.       Influenza A by PCR NEGATIVE NEGATIVE Final   Influenza B by PCR NEGATIVE NEGATIVE Final    Comment: (NOTE) The Xpert Xpress SARS-CoV-2/FLU/RSV plus assay is intended as an aid in the diagnosis of influenza from Nasopharyngeal swab specimens and should not be used as a sole basis for treatment. Nasal washings and aspirates are unacceptable for Xpert Xpress SARS-CoV-2/FLU/RSV testing.  Fact Sheet for Patients: BloggerCourse.com  Fact Sheet for Healthcare Providers: SeriousBroker.it  This test is not yet approved or cleared by the [place] FDA and has been authorized for detection and/or  diagnosis of SARS-CoV-2 by FDA under an Emergency Use Authorization (EUA). This EUA will remain in effect (meaning this test can be used) for the duration of the COVID-19 declaration under Section 564(b)(1) of the Act, 21 U.S.C. section 360bbb-3(b)(1), unless the authorization is terminated or revoked.     Resp Syncytial Virus by PCR POSITIVE (A) NEGATIVE Final    Comment: (NOTE) Fact Sheet for Patients: BloggerCourse.com  Fact Sheet for Healthcare Providers: SeriousBroker.it  This test is not yet approved or cleared by the [place] FDA and has been authorized for detection and/or diagnosis of SARS-CoV-2 by FDA under an Emergency Use Authorization (EUA). This EUA will remain in effect (meaning this test can be used) for the duration of the COVID-19 declaration under Section 564(b)(1) of the Act, 21 U.S.C. section 360bbb-3(b)(1), unless the authorization is terminated or revoked.  Performed at [facility], [address] [place], [place] [address]     Coagulation Studies: No results for input(s): "LABPROT", "INR" in the last 72 hours.   Urinalysis: No results for input(s): "COLORURINE", "LABSPEC", "PHURINE", "GLUCOSEU", "HGBUR", "BILIRUBINUR", "KETONESUR", "PROTEINUR", "UROBILINOGEN", "NITRITE", "  LEUKOCYTESUR" in the last 72 hours.  Invalid input(s): "APPERANCEUR"    Imaging: No results found.    Medications:     apixaban  5 mg Oral BID   benzonatate  100 mg Oral TID   budesonide  0.5 mg Nebulization BID   Chlorhexidine Gluconate Cloth  6 each Topical Q0600   cyclobenzaprine  5 mg Oral TID   furosemide  40 mg Oral Daily   ipratropium-albuterol  3 mL Nebulization Q6H   lanthanum  1,000 mg Oral TID WC   lumateperone tosylate  10.5 mg Oral Daily   methylPREDNISolone (SOLU-MEDROL) injection  40 mg Intravenous Q12H   metoprolol tartrate  50 mg Oral BID   midodrine  30 mg Oral See admin instructions    multivitamin  1 tablet Oral Daily   nicotine  21 mg Transdermal Daily   pantoprazole  40 mg Oral Daily   patiromer  16.8 g Oral Daily   pregabalin  75 mg Oral Daily   Vitamin D (Ergocalciferol)  50,000 Units Oral Weekly   chlorpheniramine-HYDROcodone, hydrOXYzine, lidocaine-prilocaine, ondansetron **OR** ondansetron (ZOFRAN) IV, QUEtiapine, senna-docusate  Assessment/ Plan:  Ms. MAECIE SEVCIK is a 54 y.o.  female with past medical conditions including COPD, hypertension, and end-stage renal disease on hemodialysis.  Patient presents to the emergency department complaining of shortness of breath and wheeze and has been admitted for Dyspnea on exertion [R06.09] Hypoxia [R09.02] COPD exacerbation (HCC) [J44.1] Acute respiratory failure with hypoxia (HCC) [J96.01]  [facility] MWF Lt AVG  End stage renal disease on hemodialysis.   Next treatment scheduled for Monday.  Patient is cleared to discharge from renal stents.  Patient instructed to wear a mask at outpatient dialysis clinic.  2. Acute respiratory failure due to RSV. Chest xray shows vascular congestion.  Weaned to room air.  Dry cough present.    3. Anemia of chronic kidney disease Lab Results  Component Value Date   HGB 9.7 (L) 01/24/2024    Patient receives Mircera at outpatient clinic.    4. Secondary Hyperparathyroidism: with outpatient labs: PTH 2952, phosphorus 6.7, calcium 7.9 on 01/13/24.   Lab Results  Component Value Date   PTH 73 (H) 01/03/2019   PTH Comment 01/03/2019   CALCIUM 7.1 (L) 01/26/2024   CAION 0.87 (LL) 12/06/2022   PHOS 5.9 (H) 01/24/2024    Will continue to monitor bone minerals.   LOS: 4 Dublin Grayer 1/26/202511:40 AM

## 2024-01-26 NOTE — Plan of Care (Signed)

## 2024-01-26 NOTE — Progress Notes (Signed)
Reviewed discharge instructions with patient. Patient verbalizes understanding and denies questions at this time. Peripheral IV removed per order, no complications noted. Patient repeatedly leaving the room to go smoke and states she is getting angsty. (She is alert and oriented x4). Informed patient that oxygen will be delivered in approximately 1-2 hours and asked to please stay on unit while waiting. Patient verbalizes understanding and states she will wait. However, patient still leaving unit.

## 2024-01-26 NOTE — Plan of Care (Signed)

## 2024-01-26 NOTE — Discharge Summary (Signed)
Physician Discharge Summary   Patient: Janet Olson MRN: [id number] DOB: 05/07/1970  Admit date:     01/22/2024  Discharge date: 01/26/24  Discharge Physician: Marrion Coy   PCP: Armando Gang, FNP   Recommendations at discharge:   Follow-up with PCP 1 week.  Discharge Diagnoses: Principal Problem:   COPD with acute exacerbation (HCC) Active Problems:   ESRD on dialysis (HCC)   RSV (respiratory syncytial virus pneumonia)   Elevated troponin   Cough   Acute respiratory failure with hypoxia (HCC)   Tobacco use   Tobacco dependence syndrome   Dyspnea on exertion  Resolved Problems:   * No resolved hospital problems. [facility] Course: [name] is a 54 year old female with history of hypertension, end-stage renal disease on hemodialysis, COPD, continued tobacco use, no prior oxygen supplementation, who presents emergency department for chief concerns of shortness of breath and wheezing. Her oxygen saturation has dropped down to 86%, she was placed on 3 L oxygen. Chest x-ray showed vascular congestion.  She was also positive for RSV. Patient was started on IV steroids and transition to oral prednisone for COPD exacerbation.   I had to increase steroid dose yesterday to control her bronchospasm.  Condition much improved today.  Patient medically stable for discharge, I will continue higher dose steroids at 40 mg prednisone twice a day for 3 days.  Follow-up with PCP as outpatient.  Assessment and Plan: Acute hypoxemic respiratory failure. RSV infection. * COPD with acute exacerbation (HCC) Worsening short of breath, she also developed hypoxemia.  This is a multifactorial, due to RSV infection with COPD exacerbation, end-stage renal disease with pulmonary edema. Patient has received IV steroids, will continue oral prednisone. Chest x-ray did not show any evidence of pneumonia, procalcitonin level 1.28 with end-stage renal disease.  Added 3 days of Zithromax.   Continued scheduled bronchodilators. Patient had a worsening wheezing today, still has large amount mucus production.  I changed to steroid to IV Solu-Medrol twice a day again. Condition finally improved, antibiotics completed, continue prednisone 40 mg twice a day for 3 days followed by 40 mg daily for 3 days.  Patient be followed with PCP as outpatient.   ESRD on dialysis (HCC) Hyperkalemia. Anemia of chronic disease. Potassium was 5.8, that it was prior to the dialysis.  Patient had dialyzed again yesterday, followed by nephrology.  Veltassa was continued daily. Potassium have normalized post HD.  Elevated troponin Suspect secondary to demand ischemia in setting of acute hypoxic respiratory failure due to COPD exacerbation in setting of RSV infection   Tobacco dependence syndrome Advised to quit.   Obesity with BMI 33.73.  Diet and excise.         Consultants: Nephrology Procedures performed: HD  Disposition: Home Diet recommendation:  Discharge Diet Orders (From admission, onward)     Start     Ordered   01/26/24 0000  Diet general       Comments: Renal diet, fluid 1546ml/day   01/26/24 1041           Renal diet DISCHARGE MEDICATION: Allergies as of 01/26/2024   No Known Allergies      Medication List     STOP taking these medications    amLODipine 5 MG tablet Commonly known as: NORVASC   Auryxia 1 GM 210 MG(Fe) tablet Generic drug: ferric citrate   Cholecalciferol 125 MCG (5000 UT) capsule   cinacalcet 30 MG tablet Commonly known as: SENSIPAR   Cymbalta 60 MG capsule  Generic drug: DULoxetine   Dulera 200-5 MCG/ACT Aero Generic drug: mometasone-formoterol   linaclotide 72 MCG capsule Commonly known as: LINZESS   polyethylene glycol powder 17 GM/SCOOP powder Commonly known as: GLYCOLAX/MIRALAX   sodium chloride 0.65 % Soln nasal spray Commonly known as: OCEAN   Velphoro 500 MG chewable tablet Generic drug: sucroferric oxyhydroxide        TAKE these medications    acetaminophen 500 MG tablet Commonly known as: TYLENOL Take 1,000 mg by mouth every 6 (six) hours as needed for mild pain (pain score 1-3).   albuterol (2.5 MG/3ML) 0.083% nebulizer solution Commonly known as: PROVENTIL Take 2.5 mg by nebulization every 6 (six) hours as needed for wheezing or shortness of breath.   albuterol 108 (90 Base) MCG/ACT inhaler Commonly known as: VENTOLIN HFA Inhale 2 puffs into the lungs every 6 (six) hours as needed for wheezing or shortness of breath.   benzonatate 200 MG capsule Commonly known as: TESSALON Take 400 mg by mouth 3 (three) times daily as needed for cough.   Breztri Aerosphere 160-9-4.8 MCG/ACT Aero Generic drug: Budeson-Glycopyrrol-Formoterol SMARTSIG:2 Puff(s) By Mouth Twice Daily   budesonide 0.5 MG/2ML nebulizer solution Commonly known as: PULMICORT Take 0.5 mg by nebulization daily as needed.   Caplyta 10.5 MG capsule Generic drug: lumateperone tosylate Take 1 capsule by mouth daily. What changed: Another medication with the same name was removed. Continue taking this medication, and follow the directions you see here.   chlorpheniramine-HYDROcodone 10-8 MG/5ML Commonly known as: TUSSIONEX Take 5 mLs by mouth every 12 (twelve) hours as needed for up to 7 days for cough. Do not take tramadol while taking this medicine   cyclobenzaprine 5 MG tablet Commonly known as: FLEXERIL Take 5 mg by mouth 3 (three) times daily.   Eliquis 5 MG Tabs tablet Generic drug: apixaban TAKE 1 TABLET(5 MG) BY MOUTH TWICE DAILY   furosemide 40 MG tablet Commonly known as: LASIX Take 40 mg by mouth daily.   HEPARIN LOCK FLUSH IJ Inject 1 Dose as directed every Monday, Wednesday, and Friday. At dialysis   hydrOXYzine 25 MG tablet Commonly known as: ATARAX Take by mouth. What changed: Another medication with the same name was removed. Continue taking this medication, and follow the directions you see here.    lanthanum 1000 MG chewable tablet Commonly known as: FOSRENOL Chew 1,000 mg by mouth 3 (three) times daily.   lidocaine-prilocaine cream Commonly known as: EMLA 1 application as needed (prior to port being accessed).   metoprolol tartrate 100 MG tablet Commonly known as: LOPRESSOR Take 50 mg by mouth 2 (two) times daily.   midodrine 10 MG tablet Commonly known as: PROAMATINE Take 3 tablets (30 mg total) by mouth See admin instructions. Take 3 tablet (30 mg total) by mouth an hour prior to dialysis.   multivitamin Tabs tablet Take 1 tablet by mouth daily.   nicotine 21 mg/24hr patch Commonly known as: NICODERM CQ - dosed in mg/24 hours Place 1 patch (21 mg total) onto the skin daily. Start taking on: January 27, 2024   nystatin 100000 UNIT/ML suspension Commonly known as: MYCOSTATIN Take 2 mLs by mouth daily as needed.   pantoprazole 40 MG tablet Commonly known as: PROTONIX Take 1 tablet (40 mg total) by mouth daily.   patiromer 8.4 g packet Commonly known as: VELTASSA Take 8.4 g by mouth daily.   predniSONE 20 MG tablet Commonly known as: DELTASONE Take 2 tablets (40 mg total) by mouth 2 (two) times  daily with a meal for 3 days, THEN 2 tablets (40 mg total) daily for 3 days. Start taking on: January 26, 2024   pregabalin 25 MG capsule Commonly known as: LYRICA Take 75 mg by mouth daily.   QUEtiapine 100 MG tablet Commonly known as: SEROQUEL Take 100-200 mg by mouth at bedtime as needed (sleep). What changed: Another medication with the same name was removed. Continue taking this medication, and follow the directions you see here.   traMADol 50 MG tablet Commonly known as: ULTRAM Take 50 mg by mouth every 6 (six) hours as needed.   Vitamin D (Ergocalciferol) 1.25 MG (50000 UNIT) Caps capsule Commonly known as: DRISDOL Take 50,000 Units by mouth once a week.        Follow-up Information     Armando Gang, FNP Follow up in 1 week(s).   Specialty:  Family Medicine Contact information: [address] [place] [address] [phone]                Discharge Exam: Filed Weights   01/23/24 1016 01/24/24 1412 01/24/24 1814  Weight: 94.8 kg 92.9 kg 93.1 kg   General exam: Appears calm and comfortable  Respiratory system: Decreased breath sounds, wheezing much improved. Respiratory effort normal. Cardiovascular system: S1 & S2 heard, RRR. No JVD, murmurs, rubs, gallops or clicks. No pedal edema. Gastrointestinal system: Abdomen is nondistended, soft and nontender. No organomegaly or masses felt. Normal bowel sounds heard. Central nervous system: Alert and oriented. No focal neurological deficits. Extremities: Symmetric 5 x 5 power. Skin: No rashes, lesions or ulcers Psychiatry: Judgement and insight appear normal. Mood & affect appropriate.    Condition at discharge: good  The results of significant diagnostics from this hospitalization (including imaging, microbiology, ancillary and laboratory) are listed below for reference.   Imaging Studies: DG Chest Port 1 View Result Date: 01/22/2024 CLINICAL DATA:  CHF, COPD exacerbation, hypoxia EXAM: PORTABLE CHEST 1 VIEW COMPARISON:  04/11/2022 FINDINGS: Single frontal view of the chest demonstrates marked enlargement of the cardiac silhouette, stable. Chronic pulmonary vascular congestion without airspace disease, effusion, or pneumothorax. No acute bony abnormalities. IMPRESSION: 1. Stable enlarged cardiac silhouette. 2. Chronic pulmonary vascular congestion without overt edema. Electronically Signed   By: Sharlet Salina M.D.   On: 01/22/2024 17:15    Microbiology: Results for orders placed or performed during the hospital encounter of 01/22/24  Blood culture (routine single)     Status: None (Preliminary result)   Collection Time: 01/22/24  4:34 PM   Specimen: BLOOD  Result Value Ref Range Status   Specimen Description BLOOD BLOOD RIGHT ARM  Final   Special Requests    Final    BOTTLES DRAWN AEROBIC AND ANAEROBIC Blood Culture results may not be optimal due to an inadequate volume of blood received in culture bottles   Culture   Final    NO GROWTH 4 DAYS Performed at [facility], [address], [place], [place] [address]    Report Status PENDING  Incomplete  Resp panel by RT-PCR (RSV, Flu A&B, Covid) Anterior Nasal Swab     Status: Abnormal   Collection Time: 01/22/24  5:51 PM   Specimen: Anterior Nasal Swab  Result Value Ref Range Status   SARS Coronavirus 2 by RT PCR NEGATIVE NEGATIVE Final    Comment: (NOTE) SARS-CoV-2 target nucleic acids are NOT DETECTED.  The SARS-CoV-2 RNA is generally detectable in upper respiratory specimens during the acute phase of infection. The lowest concentration of SARS-CoV-2  viral copies this assay can detect is 138 copies/mL. A negative result does not preclude SARS-Cov-2 infection and should not be used as the sole basis for treatment or other patient management decisions. A negative result may occur with  improper specimen collection/handling, submission of specimen other than nasopharyngeal swab, presence of viral mutation(s) within the areas targeted by this assay, and inadequate number of viral copies(<138 copies/mL). A negative result must be combined with clinical observations, patient history, and epidemiological information. The expected result is Negative.  Fact Sheet for Patients:  BloggerCourse.com  Fact Sheet for Healthcare Providers:  SeriousBroker.it  This test is no t yet approved or cleared by the [place] FDA and  has been authorized for detection and/or diagnosis of SARS-CoV-2 by FDA under an Emergency Use Authorization (EUA). This EUA will remain  in effect (meaning this test can be used) for the duration of the COVID-19 declaration under Section 564(b)(1) of the Act, 21 U.S.C.section 360bbb-3(b)(1), unless the  authorization is terminated  or revoked sooner.       Influenza A by PCR NEGATIVE NEGATIVE Final   Influenza B by PCR NEGATIVE NEGATIVE Final    Comment: (NOTE) The Xpert Xpress SARS-CoV-2/FLU/RSV plus assay is intended as an aid in the diagnosis of influenza from Nasopharyngeal swab specimens and should not be used as a sole basis for treatment. Nasal washings and aspirates are unacceptable for Xpert Xpress SARS-CoV-2/FLU/RSV testing.  Fact Sheet for Patients: BloggerCourse.com  Fact Sheet for Healthcare Providers: SeriousBroker.it  This test is not yet approved or cleared by the [place] FDA and has been authorized for detection and/or diagnosis of SARS-CoV-2 by FDA under an Emergency Use Authorization (EUA). This EUA will remain in effect (meaning this test can be used) for the duration of the COVID-19 declaration under Section 564(b)(1) of the Act, 21 U.S.C. section 360bbb-3(b)(1), unless the authorization is terminated or revoked.     Resp Syncytial Virus by PCR POSITIVE (A) NEGATIVE Final    Comment: (NOTE) Fact Sheet for Patients: BloggerCourse.com  Fact Sheet for Healthcare Providers: SeriousBroker.it  This test is not yet approved or cleared by the [place] FDA and has been authorized for detection and/or diagnosis of SARS-CoV-2 by FDA under an Emergency Use Authorization (EUA). This EUA will remain in effect (meaning this test can be used) for the duration of the COVID-19 declaration under Section 564(b)(1) of the Act, 21 U.S.C. section 360bbb-3(b)(1), unless the authorization is terminated or revoked.  Performed at [facility], [address] [place], [place] [address]     Labs: CBC: Recent Labs  Lab 01/22/24 1633 01/23/24 0634 01/24/24 1520  WBC 5.5 5.2 6.1  NEUTROABS 3.4  --  4.8  HGB 10.1* 9.5* 9.7*  HCT 32.0* 31.0* 31.6*   MCV 95.5 95.7 94.0  PLT 174 166 168   Basic Metabolic Panel: Recent Labs  Lab 01/22/24 1633 01/23/24 0634 01/24/24 1520 01/26/24 0516  NA 140 139 137 138  K 4.6 5.4* 5.8* 4.9  CL 99 99 97* 99  CO2 26 22 24 23   GLUCOSE 103* 143* 106* 160*  BUN 62* 77* 69* 80*  CREATININE 11.73* 12.52* 10.47* 9.94*  CALCIUM 8.3* 7.4* 7.8* 7.1*  PHOS  --   --  5.9*  --    Liver Function Tests: Recent Labs  Lab 01/22/24 1633 01/24/24 1520  AST 23  --   ALT 21  --   ALKPHOS 175*  --   BILITOT 1.3*  --  PROT 7.1  --   ALBUMIN 3.6 3.5   CBG: No results for input(s): "GLUCAP" in the last 168 hours.  Discharge time spent: greater than 30 minutes.  Signed: Marrion Coy, MD [facility] 01/26/2024

## 2024-01-27 LAB — CULTURE, BLOOD (SINGLE): Culture: NO GROWTH

## 2024-01-28 ENCOUNTER — Ambulatory Visit: Payer: 59 | Attending: Physician Assistant | Admitting: Physician Assistant

## 2024-01-28 NOTE — Progress Notes (Deleted)
 Cardiology Office Note    Date:  01/28/2024   ID:  Janet Olson, DOB [birth year], MRN [id number]  PCP:  Armando Gang, FNP  Cardiologist:  Julien Nordmann, MD  Electrophysiologist:  None   Chief Complaint: ***  History of Present Illness:   Janet Olson is a 54 y.o. female with history of ***  She was admitted to [facility] earlier this month with volume overload felt to be in the setting of ***  She was recently admitted to the hospital from 1/22 through 01/26/2024 with acute hypoxic respiratory failure secondary to COPD exacerbation and RSV.  ***   Labs independently reviewed: ***  Past Medical History:  Diagnosis Date   (HFpEF) heart failure with preserved ejection fraction (HCC)    a. 2015 Echo: Nl EF. Mild to mod LVH; b. 04/2017 Echo: EF 55-60% Gr1 DD; c. 12/2018 Echo: EF 55-60%; d. 02/2020 Echo: EF 50-55%; e. 10/2020 Echo: EF 55-60%, mild LVH, mildly enlarged RV, sev elev RVSP, BAE, mild MR; 12/2020 Echo: EF 65%, sev dil RA, dil LA, RVSP 29.46mmHg, MR/PR.   Abdominal ascites    Acute hypoxemic respiratory failure (HCC) 01/03/2019   Acute renal failure (ARF) (HCC) 05/17/2017   Acute respiratory failure (HCC) 09/25/2018   Anemia    Anxiety    Arthritis    Asthma    Atrial flutter (HCC)    a. Dx 03/2022-->CHA2DS2VASc = 3-->eliquis.   Cardiomegaly    COPD (chronic obstructive pulmonary disease) (HCC)    Depression    Dialysis patient (HCC)    MON, WED , FRI   Dyspnea    ESRD (end stage renal disease) (HCC)    GERD (gastroesophageal reflux disease)    Headache    Migraines   Hypertension    Nonrheumatic tricuspid (valve) insufficiency    Pericardial effusion 01/26/2019   Respiratory failure with hypoxia (HCC) 04/21/2018   Ulcer    gastric   Vitamin D deficiency     Past Surgical History:  Procedure Laterality Date   A/V FISTULAGRAM Left 04/17/2018   Procedure: A/V FISTULAGRAM;  Surgeon: Annice Needy, MD;  Location: [facility] INVASIVE CV LAB;  Service:  Cardiovascular;  Laterality: Left;   A/V FISTULAGRAM Left 05/27/2018   Procedure: A/V FISTULAGRAM;  Surgeon: Renford Dills, MD;  Location: [facility] INVASIVE CV LAB;  Service: Cardiovascular;  Laterality: Left;   A/V FISTULAGRAM Left 07/05/2020   Procedure: A/V FISTULAGRAM;  Surgeon: Renford Dills, MD;  Location: [facility] INVASIVE CV LAB;  Service: Cardiovascular;  Laterality: Left;   A/V FISTULAGRAM Left 06/26/2022   Procedure: A/V Fistulagram;  Surgeon: Renford Dills, MD;  Location: [facility] INVASIVE CV LAB;  Service: Cardiovascular;  Laterality: Left;   AV FISTULA PLACEMENT Left 08/08/2017   Procedure: ARTERIOVENOUS (AV) FISTULA CREATION ( BRACHIOCEPHALIC );  Surgeon: Annice Needy, MD;  Location: [facility] ORS;  Service: Vascular;  Laterality: Left;   AV FISTULA PLACEMENT Left 08/05/2020   Procedure: INSERTION OF ARTERIOVENOUS (AV) GRAFT ARM(BRACHIAL AXILLARY);  Surgeon: Renford Dills, MD;  Location: [facility] ORS;  Service: Vascular;  Laterality: Left;   CARDIOVERSION N/A 05/01/2022   Procedure: CARDIOVERSION;  Surgeon: Antonieta Iba, MD;  Location: [facility] ORS;  Service: Cardiovascular;  Laterality: N/A;   CESAREAN SECTION     x 4 (1988, 1991, 1997, 2000 )   COLONOSCOPY WITH PROPOFOL N/A 04/24/2018   Procedure: COLONOSCOPY WITH PROPOFOL;  Surgeon: Toney Reil, MD;  Location: [facility] ENDOSCOPY;  Service: Gastroenterology;  Laterality: N/A;  COLONOSCOPY WITH PROPOFOL N/A 12/06/2022   Procedure: COLONOSCOPY WITH PROPOFOL;  Surgeon: Toney Reil, MD;  Location: [facility] ENDOSCOPY;  Service: Gastroenterology;  Laterality: N/A;   DIALYSIS/PERMA CATHETER INSERTION N/A 07/16/2017   Procedure: Dialysis/Perma Catheter Insertion;  Surgeon: Renford Dills, MD;  Location: [facility] INVASIVE CV LAB;  Service: Cardiovascular;  Laterality: N/A;   DIALYSIS/PERMA CATHETER REMOVAL N/A 10/29/2017   Procedure: DIALYSIS/PERMA CATHETER REMOVAL;  Surgeon: Renford Dills, MD;  Location: [facility]VASIVE CV LAB;   Service: Cardiovascular;  Laterality: N/A;   DIALYSIS/PERMA CATHETER REMOVAL N/A 11/03/2020   Procedure: DIALYSIS/PERMA CATHETER REMOVAL;  Surgeon: Annice Needy, MD;  Location: [facility] CV [facility];  Service: Cardiovascular;  Laterality: N/A;   ESOPHAGOGASTRODUODENOSCOPY (EGD) WITH PROPOFOL N/A 12/06/2022   Procedure: ESOPHAGOGASTRODUODENOSCOPY (EGD) WITH PROPOFOL;  Surgeon: Toney Reil, MD;  Location: [facility] ENDOSCOPY;  Service: Gastroenterology;  Laterality: N/A;   excision of lymph nodes Bilateral 2014   bilateral under arms.   PARATHYROID IMPLANT REMOVAL     REMOVAL OF A DIALYSIS CATHETER Left 08/05/2020   Procedure: REMOVAL OF A DIALYSIS CATHETER ( FISTULA );  Surgeon: Renford Dills, MD;  Location: [facility] ORS;  Service: Vascular;  Laterality: Left;   SHOULDER ARTHROSCOPY WITH SUBACROMIAL DECOMPRESSION AND OPEN ROTATOR C Right 01/08/2022   Procedure: Right shoulder arthroscopic subscapularis repair, rotator cuff repair, subacromial decompression, and distal clavicle excision;  Surgeon: Signa Kell, MD;  Location: [facility] ORS;  Service: Orthopedics;  Laterality: Right;   TEE WITHOUT CARDIOVERSION N/A 05/01/2022   Procedure: TRANSESOPHAGEAL ECHOCARDIOGRAM (TEE);  Surgeon: Antonieta Iba, MD;  Location: [facility] ORS;  Service: Cardiovascular;  Laterality: N/A;    Current Medications: No outpatient medications have been marked as taking for the 01/28/24 encounter (Appointment) with Sondra Barges, PA-C.    Allergies:   Patient has no known allergies.   Social History   Socioeconomic History   Marital status: Single    Spouse name: Not on file   Number of children: Not on file   Years of education: Not on file   Highest education level: Not on file  Occupational History   Not on file  Tobacco Use   Smoking status: Every Day    Current packs/day: 0.50    Average packs/day: 0.5 packs/day for 30.0 years (15.0 ttl pk-yrs)    Types: Cigarettes   Smokeless tobacco: Never  Vaping Use    Vaping status: Never Used  Substance and Sexual Activity   Alcohol use: No   Drug use: No   Sexual activity: Not Currently  Other Topics Concern   Not on file  Social History Narrative   Not on file   Social Drivers of Health   Financial Resource Strain: Low Risk  (01/09/2024)   Received from [facility]   Overall Financial Resource Strain (CARDIA)    Difficulty of Paying Living Expenses: Not hard at all  Food Insecurity: No Food Insecurity (01/23/2024)   Hunger Vital Sign    Worried About Running Out of Food in the Last Year: Never true    Ran Out of Food in the Last Year: Never true  Transportation Needs: No Transportation Needs (01/23/2024)   PRAPARE - Administrator, Civil Service (Medical): No    Lack of Transportation (Non-Medical): No  Physical Activity: Not on file  Stress: Not on file  Social Connections: Moderately Integrated (01/23/2024)   Social Connection and Isolation Panel [NHANES]    Frequency of Communication with Friends and Family:  Twice a week    Frequency of Social Gatherings with Friends and Family: More than three times a week    Attends Religious Services: More than 4 times per year    Active Member of [employer] or Organizations: Yes    Attends Banker Meetings: Never    Marital Status: Divorced     Family History:  The patient's family history includes Diabetes in her mother.  ROS:   12-point review of systems is negative unless otherwise noted in the HPI.   EKGs/Labs/Other Studies Reviewed:    Studies reviewed were summarized above. The additional studies were reviewed today: ***  EKG:  EKG is ordered today.  The EKG ordered today demonstrates ***  Recent Labs: 01/22/2024: ALT 21 01/24/2024: Hemoglobin 9.7; Platelets 168 01/26/2024: BUN 80; Creatinine, Ser 9.94; Potassium 4.9; Sodium 138  Recent Lipid Panel    Component Value Date/Time   CHOL 121 05/19/2021 0932   TRIG 49 05/19/2021 0932   HDL 70 05/19/2021 0932    CHOLHDL 1.7 05/19/2021 0932   CHOLHDL 3.1 05/17/2017 1436   VLDL 39 05/17/2017 1436   LDLCALC 39 05/19/2021 0932   LDLDIRECT 43 05/19/2021 0932    PHYSICAL EXAM:    VS:  LMP 03/03/2018 (Approximate)   BMI: There is no height or weight on file to calculate BMI.  Physical Exam  Wt Readings from Last 3 Encounters:  01/24/24 205 lb 4 oz (93.1 kg)  12/20/23 214 lb 8 oz (97.3 kg)  06/26/23 194 lb (88 kg)     ASSESSMENT & PLAN:   ***   {Are you ordering a CV Procedure (e.g. stress test, cath, DCCV, TEE, etc)?   Press F2        :[id number]}     Disposition: F/u with Dr. Mariah Milling or an APP in ***.   Medication Adjustments/Labs and Tests Ordered: Current medicines are reviewed at length with the patient today.  Concerns regarding medicines are outlined above. Medication changes, Labs and Tests ordered today are summarized above and listed in the Patient Instructions accessible in Encounters.   Signed, Eula Listen, PA-C 01/28/2024 8:10 AM     [facility] [place] [address] [place], [place] [address] [phone]

## 2024-02-17 ENCOUNTER — Telehealth: Payer: Self-pay | Admitting: Cardiovascular Disease

## 2024-02-17 NOTE — Telephone Encounter (Signed)
 Error

## 2024-02-21 ENCOUNTER — Ambulatory Visit: Payer: 59 | Attending: Cardiovascular Disease | Admitting: Cardiovascular Disease

## 2024-02-21 ENCOUNTER — Encounter: Payer: Self-pay | Admitting: Cardiovascular Disease

## 2024-02-21 ENCOUNTER — Telehealth: Payer: Self-pay | Admitting: *Deleted

## 2024-02-21 VITALS — BP 110/60 | HR 86 | Ht 66.0 in | Wt 222.0 lb

## 2024-02-21 DIAGNOSIS — Z992 Dependence on renal dialysis: Secondary | ICD-10-CM

## 2024-02-21 DIAGNOSIS — I5022 Chronic systolic (congestive) heart failure: Secondary | ICD-10-CM | POA: Diagnosis not present

## 2024-02-21 DIAGNOSIS — I10 Essential (primary) hypertension: Secondary | ICD-10-CM

## 2024-02-21 DIAGNOSIS — I480 Paroxysmal atrial fibrillation: Secondary | ICD-10-CM

## 2024-02-21 DIAGNOSIS — I953 Hypotension of hemodialysis: Secondary | ICD-10-CM | POA: Diagnosis not present

## 2024-02-21 DIAGNOSIS — J449 Chronic obstructive pulmonary disease, unspecified: Secondary | ICD-10-CM

## 2024-02-21 DIAGNOSIS — I483 Typical atrial flutter: Secondary | ICD-10-CM

## 2024-02-21 DIAGNOSIS — I5032 Chronic diastolic (congestive) heart failure: Secondary | ICD-10-CM

## 2024-02-21 DIAGNOSIS — N186 End stage renal disease: Secondary | ICD-10-CM

## 2024-02-21 DIAGNOSIS — I700 Atherosclerosis of aorta: Secondary | ICD-10-CM

## 2024-02-21 MED ORDER — MIDODRINE HCL 10 MG PO TABS: 20.0000 mg | ORAL_TABLET | ORAL | 3 refills | Status: AC

## 2024-02-21 NOTE — Patient Instructions (Addendum)
 Ask Dr. Cherylann Ratel about seeing endocrine doctor  Medication Instructions:  Midodrine 20 mg prior to dialysis  If you need a refill on your cardiac medications before your next appointment, please call your pharmacy.   Lab work: No new labs needed  Testing/Procedures: No new testing needed  Follow-Up: At [facility], you and your health needs are our priority.  As part of our continuing mission to provide you with exceptional heart care, we have created designated Provider Care Teams.  These Care Teams include your primary Cardiologist (physician) and Advanced Practice Providers (APPs -  Physician Assistants and Nurse Practitioners) who all work together to provide you with the care you need, when you need it.  You will need a follow up appointment in 12 months  Providers on your designated Care Team:   Nicolasa Ducking, NP Eula Listen, PA-C Cadence Fransico [place], [place]  COVID-19 Vaccine Information can be found at: [email or web address] For questions related to vaccine distribution or appointments, please email [email or web address] or call [phone].

## 2024-02-21 NOTE — Progress Notes (Signed)
 Cardiology Office Note  Date:  02/21/2024   ID:  Janet Olson, Janet Olson 1970/11/18, MRN [id number]  PCP:  Armando Gang, FNP   Chief Complaint  Patient presents with   Follow up [facility]     Patient had an Echo at [facility] on 01/09/2024. Patient c/o shortness of breath.     HPI:  Janet Olson is a 54 year old woman with past medical history of ESRD On HD mon/wed/Fri for 4 years Smoker 13 cigarettes/day, COPD HTN/renal dz Anemia of chronic dz Asthma Left ventricular ejection fraction, by estimation, is 40 to 45%. Atrial flutter April and May 2023 with cardioversion May 01, 2022 Ascites requiring paracentesis Who presents for f/u of her pulmonary edema, atrial flutter, cardiomyopathy  Last seen in clinic by myself  in June 2024 Seen by one of our providers December 2024 Taking midodrine 10 mg up to 20 mg before dialysis She reported having continued hypotension during dialysis It was suggested by one of our providers to take midodrine 30 mg before dialysis Also instructed to hold metoprolol and amlodipine before dialysis Echo ordered but not completed  In hospital at [facility], RSV, on prednisone 3 days On oxygen still at home, ok at rest, only with exertion  Echo at [facility], results reviewed normal EF, mild to moderate dilation of atria Amlodipine stopped  HD on Mon, wed, Frid Pressure this Am 110/60, has not taken midodrine  Needs teeth pulled, 4 teeth Will need to stop Eliquis 2 days prior Would continue heparin flushes  Remain on metoprolol tartrate 50 twice daily  EKG personally reviewed by myself on todays visit EKG Interpretation Date/Time:  Friday February 21 2024 08:41:05 EST Ventricular Rate:  86 PR Interval:  200 QRS Duration:  88 QT Interval:  414 QTC Calculation: 495 R Axis:   86  Text Interpretation: Normal sinus rhythm Possible Left atrial enlargement T wave abnormality, consider anterolateral ischemia Prolonged QT When compared with ECG of  22-Jan-2024 16:34, No significant change was found Confirmed by Julien Nordmann [phone]) on 02/21/2024 8:50:54 AM    Other past medical history reviewed hospitalizations  admitted to [facility] between April 12 and April 13 2022 in the setting of altered mental status and acute uremia, after skipping dialysis for a week.  In the ED, she was initially minimally responsive, and she was admitted for uremic encephalopathy and hyperkalemia with emergent hemodialysis on April 12.   admission ECG showed rate controlled 2-1 atrial flutter at 94 bpm.    Her metoprolol dose was increased during her hospitalization of 75 mg twice daily.  Seen in the office in follow-up and cardioversion was scheduled cardioversion May 01, 2022 for atrial flutter Normal sinus rhythm restored  She remains in normal sinus rhythm on today's visit, denies any tachypalpitations concerning for arrhythmia This week had HD sat, Monday, wed,   weight higher, more swelling, normal 3:45 hr, stopping early Fistula working slow, "main sticker having trouble", "has knots" Has appt with vascular June 1st to review fistula  Takes torsemide 100 mg daily, makes  Lots of ice intake , large bag a week Followed by Dr. Cherylann Ratel Some SOB, has albuterol nebs  Still smoking Leg swelling worse on right than left  Paracentesis 2 weeks ago,05/08/2022 Feels that it is coming back as dialysis has been more troublesome  Hospital 10/2020 paracentesis with 3.2 L fluid removal.   Blood culture returned positive for Enterococcus bacteremia.   started on IV ampicillin.   Tunneled hemodialysis catheter removed  on 11/4.   echocardiogram which was negative for endocarditis.   TEE was tentatively scheduled for 11/8. She declined the study, left AMA  Echo 12/2020 done with primary care, normal LV function Unable to review images  Could not tolerate coreg, upset stomach  hospitalized 1/21, ascites requiring paracentesis with removal of 2 L  fluid.  Patient reports that she has missed several recent dialysis (3 in total) as she was feeling sick.  Hospital 02/2020,  Secondary to volume overload after missing several recent dialysis sessions. paracentesis with 7.5 L clear ascites fluid removed  Echo 02/2020 results reviewed with her  1. Left ventricular ejection fraction, by estimation, is 50 to 55%. The  left ventricle has low normal function. The left ventricle has no regional  wall motion abnormalities. There is moderate left ventricular hypertrophy.  -- interventricular septum is  flattened in diastole ('D' shaped left ventricle), consistent with right  ventricular volume overload.   --Right ventricular systolic function is moderately reduced. The right  ventricular size is mildly enlarged. There is moderately elevated  pulmonary artery systolic pressure.  --Acites is present.. The pericardial effusion is posterior to the left  ventricle.   Moderately dilated pulmonary artery.    The inferior vena cava is dilated  right atrial pressure of 15 mmHg.    PMH:   has a past medical history of (HFpEF) heart failure with preserved ejection fraction (HCC), Abdominal ascites, Acute hypoxemic respiratory failure (HCC) (01/03/2019), Acute renal failure (ARF) (HCC) (05/17/2017), Acute respiratory failure (HCC) (09/25/2018), Anemia, Anxiety, Arthritis, Asthma, Atrial flutter (HCC), Cardiomegaly, COPD (chronic obstructive pulmonary disease) (HCC), Depression, Dialysis patient (HCC), Dyspnea, ESRD (end stage renal disease) (HCC), GERD (gastroesophageal reflux disease), Headache, Hypertension, Nonrheumatic tricuspid (valve) insufficiency, Pericardial effusion (01/26/2019), Respiratory failure with hypoxia (HCC) (04/21/2018), Ulcer, and Vitamin D deficiency.  PSH:    Past Surgical History:  Procedure Laterality Date   A/V FISTULAGRAM Left 04/17/2018   Procedure: A/V FISTULAGRAM;  Surgeon: Annice Needy, MD;  Location: [facility] INVASIVE CV LAB;   Service: Cardiovascular;  Laterality: Left;   A/V FISTULAGRAM Left 05/27/2018   Procedure: A/V FISTULAGRAM;  Surgeon: Renford Dills, MD;  Location: [facility];  Service: Cardiovascular;  Laterality: Left;   A/V FISTULAGRAM Left 07/05/2020   Procedure: A/V FISTULAGRAM;  Surgeon: Renford Dills, MD;  Location: [facility];  Service: Cardiovascular;  Laterality: Left;   A/V FISTULAGRAM Left 06/26/2022   Procedure: A/V Fistulagram;  Surgeon: Renford Dills, MD;  Location: [facility] CV [facility];  Service: Cardiovascular;  Laterality: Left;   AV FISTULA PLACEMENT Left 08/08/2017   Procedure: ARTERIOVENOUS (AV) FISTULA CREATION ( BRACHIOCEPHALIC );  Surgeon: Annice Needy, MD;  Location: [facility] ORS;  Service: Vascular;  Laterality: Left;   AV FISTULA PLACEMENT Left 08/05/2020   Procedure: INSERTION OF ARTERIOVENOUS (AV) GRAFT ARM(BRACHIAL AXILLARY);  Surgeon: Renford Dills, MD;  Location: [facility] ORS;  Service: Vascular;  Laterality: Left;   CARDIOVERSION N/A 05/01/2022   Procedure: CARDIOVERSION;  Surgeon: Antonieta Iba, MD;  Location: [facility] ORS;  Service: Cardiovascular;  Laterality: N/A;   CESAREAN SECTION     x 4 (1988, 1991, 1997, 2000 )   COLONOSCOPY WITH PROPOFOL N/A 04/24/2018   Procedure: COLONOSCOPY WITH PROPOFOL;  Surgeon: Toney Reil, MD;  Location: [facility] ENDOSCOPY;  Service: Gastroenterology;  Laterality: N/A;   COLONOSCOPY WITH PROPOFOL N/A 12/06/2022   Procedure: COLONOSCOPY WITH PROPOFOL;  Surgeon: Toney Reil, MD;  Location: [facility] ENDOSCOPY;  Service:  Gastroenterology;  Laterality: N/A;   DIALYSIS/PERMA CATHETER INSERTION N/A 07/16/2017   Procedure: Dialysis/Perma Catheter Insertion;  Surgeon: Renford Dills, MD;  Location: [facility];  Service: Cardiovascular;  Laterality: N/A;   DIALYSIS/PERMA CATHETER REMOVAL N/A 10/29/2017   Procedure: DIALYSIS/PERMA CATHETER REMOVAL;  Surgeon: Renford Dills, MD;  Location: [facility];   Service: Cardiovascular;  Laterality: N/A;   DIALYSIS/PERMA CATHETER REMOVAL N/A 11/03/2020   Procedure: DIALYSIS/PERMA CATHETER REMOVAL;  Surgeon: Annice Needy, MD;  Location: [facility];  Service: Cardiovascular;  Laterality: N/A;   ESOPHAGOGASTRODUODENOSCOPY (EGD) WITH PROPOFOL N/A 12/06/2022   Procedure: ESOPHAGOGASTRODUODENOSCOPY (EGD) WITH PROPOFOL;  Surgeon: Toney Reil, MD;  Location: [facility] ENDOSCOPY;  Service: Gastroenterology;  Laterality: N/A;   excision of lymph nodes Bilateral 2014   bilateral under arms.   PARATHYROID IMPLANT REMOVAL     REMOVAL OF A DIALYSIS CATHETER Left 08/05/2020   Procedure: REMOVAL OF A DIALYSIS CATHETER ( FISTULA );  Surgeon: Renford Dills, MD;  Location: [facility] ORS;  Service: Vascular;  Laterality: Left;   SHOULDER ARTHROSCOPY WITH SUBACROMIAL DECOMPRESSION AND OPEN ROTATOR C Right 01/08/2022   Procedure: Right shoulder arthroscopic subscapularis repair, rotator cuff repair, subacromial decompression, and distal clavicle excision;  Surgeon: Signa Kell, MD;  Location: [facility] ORS;  Service: Orthopedics;  Laterality: Right;   TEE WITHOUT CARDIOVERSION N/A 05/01/2022   Procedure: TRANSESOPHAGEAL ECHOCARDIOGRAM (TEE);  Surgeon: Antonieta Iba, MD;  Location: [facility] ORS;  Service: Cardiovascular;  Laterality: N/A;    Current Outpatient Medications  Medication Sig Dispense Refill   acetaminophen (TYLENOL) 500 MG tablet Take 1,000 mg by mouth every 6 (six) hours as needed for mild pain (pain score 1-3).     albuterol (PROVENTIL) (2.5 MG/3ML) 0.083% nebulizer solution Take 2.5 mg by nebulization every 6 (six) hours as needed for wheezing or shortness of breath.      albuterol (VENTOLIN HFA) 108 (90 Base) MCG/ACT inhaler Inhale 2 puffs into the lungs every 6 (six) hours as needed for wheezing or shortness of breath.      benzonatate (TESSALON) 200 MG capsule Take 400 mg by mouth 3 (three) times daily as needed for cough.     BREZTRI AEROSPHERE  160-9-4.8 MCG/ACT AERO SMARTSIG:2 Puff(s) By Mouth Twice Daily     budesonide (PULMICORT) 0.5 MG/2ML nebulizer solution Take 0.5 mg by nebulization daily as needed.     CAPLYTA 10.5 MG CAPS Take 1 capsule by mouth daily.     cyclobenzaprine (FLEXERIL) 5 MG tablet Take 5 mg by mouth 3 (three) times daily.     ELIQUIS 5 MG TABS tablet TAKE 1 TABLET(5 MG) BY MOUTH TWICE DAILY 60 tablet 11   furosemide (LASIX) 40 MG tablet Take 40 mg by mouth daily.     Heparin Sodium, Porcine, (HEPARIN LOCK FLUSH IJ) Inject 1 Dose as directed every Monday, Wednesday, and Friday. At dialysis     hydrOXYzine (ATARAX) 25 MG tablet Take by mouth.     lanthanum (FOSRENOL) 1000 MG chewable tablet Chew 1,000 mg by mouth 3 (three) times daily.     lidocaine-prilocaine (EMLA) cream 1 application as needed (prior to port being accessed).     metoprolol tartrate (LOPRESSOR) 100 MG tablet Take 50 mg by mouth 2 (two) times daily.     midodrine (PROAMATINE) 10 MG tablet Take 3 tablets (30 mg total) by mouth See admin instructions. Take 3 tablet (30 mg total) by mouth an hour prior to dialysis. 108 tablet 3  multivitamin (RENA-VIT) TABS tablet Take 1 tablet by mouth daily.   1   nystatin (MYCOSTATIN) 100000 UNIT/ML suspension Take 2 mLs by mouth daily as needed.     pantoprazole (PROTONIX) 40 MG tablet Take 1 tablet (40 mg total) by mouth daily. 30 tablet 0   patiromer (VELTASSA) 8.4 g packet Take 8.4 g by mouth daily.     pregabalin (LYRICA) 25 MG capsule Take 75 mg by mouth daily.     traMADol (ULTRAM) 50 MG tablet Take 50 mg by mouth every 6 (six) hours as needed.     Vitamin D, Ergocalciferol, (DRISDOL) 1.25 MG (50000 UNIT) CAPS capsule Take 50,000 Units by mouth once a week.     nicotine (NICODERM CQ - DOSED IN MG/24 HOURS) 21 mg/24hr patch Place 1 patch (21 mg total) onto the skin daily. (Patient not taking: Reported on 02/21/2024) 28 patch 0   QUEtiapine (SEROQUEL) 100 MG tablet Take 100-200 mg by mouth at bedtime as needed  (sleep). (Patient not taking: Reported on 02/21/2024)     No current facility-administered medications for this visit.    Allergies:   Patient has no known allergies.   Social History:  The patient  reports that she has been smoking cigarettes. She has a 15 pack-year smoking history. She has never used smokeless tobacco. She reports that she does not drink alcohol and does not use drugs.   Family History:   family history includes Diabetes in her mother.    Review of Systems: Review of Systems  Constitutional:  Positive for malaise/fatigue.  HENT: Negative.    Respiratory:  Positive for shortness of breath.   Cardiovascular:  Positive for leg swelling.  Gastrointestinal: Negative.   Musculoskeletal: Negative.   Neurological: Negative.   Psychiatric/Behavioral: Negative.    All other systems reviewed and are negative.   PHYSICAL EXAM: VS:  BP 110/60 (BP Location: Left Arm, Patient Position: Sitting, Cuff Size: Normal)   Pulse 86   Ht 5\' 6"  (1.676 m)   Wt 222 lb (100.7 kg)   LMP 03/03/2018 (Approximate)   SpO2 95%   BMI 35.83 kg/m  , BMI Body mass index is 35.83 kg/m. Constitutional:  oriented to person, place, and time. No distress.  HENT:  Head: Grossly normal Eyes:  no discharge. No scleral icterus.  Neck: No JVD, no carotid bruits  Cardiovascular: Regular rate and rhythm, no murmurs appreciated Pulmonary/Chest: Clear to auscultation bilaterally, no wheezes or rails Abdominal: Soft.  no distension.  no tenderness.  Musculoskeletal: Normal range of motion Neurological:  normal muscle tone. Coordination normal. No atrophy Skin: Skin warm and dry Psychiatric: normal affect, pleasant  Recent Labs: 01/22/2024: ALT 21 01/24/2024: Hemoglobin 9.7; Platelets 168 01/26/2024: BUN 80; Creatinine, Ser 9.94; Potassium 4.9; Sodium 138    Lipid Panel Lab Results  Component Value Date   CHOL 121 05/19/2021   HDL 70 05/19/2021   LDLCALC 39 05/19/2021   TRIG 49 05/19/2021       Wt Readings from Last 3 Encounters:  02/21/24 222 lb (100.7 kg)  01/24/24 205 lb 4 oz (93.1 kg)  12/20/23 214 lb 8 oz (97.3 kg)     ASSESSMENT AND PLAN:  ESRD on dialysis [facility]) -  Therapy on Monday Wednesday Friday, Sometimes Saturday Continues to have hypotension during dialysis, recommend she take midodrine 20 mg 1 to 2 hours prior to dialysis Bladder pain held while she was at [facility]  Chronic obstructive pulmonary disease, unspecified COPD type (HCC) :  Recent COPD  exacerbation in the setting of RSV Long history of smoking,  Smoking cessation recommended On nebulizers/inhalers Completed course of prednisone  Anemia in chronic kidney disease, on chronic dialysis (HCC) - Anemia managed by nephrology,  On Epogen Managed by nephrology  Atrial flutter  hospital admission April 2023, underwent cardioversion,  maintaining normal sinus rhythm Continue metoprolol tartrate 50 twice daily Eliquis 5 twice daily  benign hypertensive renal disease -  Amlodipine on hold, continue metoprolol tartrate Midodrine 20 mg prior to hemodialysis Monday Wednesday Friday  Cardiac hypertrophy Secondary to longstanding hypertension  Ascites Stable Controlled with hemodialysis    Signed, Dossie Arbour, M.D., Ph.D. 02/21/2024  [facility] [place], [place] [phone]

## 2024-02-21 NOTE — Telephone Encounter (Signed)
 Called to request clearance letter for this patient. Provided fax number and staff notified to watch for it to come through.

## 2024-02-25 ENCOUNTER — Encounter (INDEPENDENT_AMBULATORY_CARE_PROVIDER_SITE_OTHER): Payer: 59

## 2024-02-25 ENCOUNTER — Telehealth: Payer: Self-pay | Admitting: Cardiovascular Disease

## 2024-02-25 ENCOUNTER — Ambulatory Visit (INDEPENDENT_AMBULATORY_CARE_PROVIDER_SITE_OTHER): Payer: 59 | Admitting: Nurse Practitioner

## 2024-02-25 NOTE — Telephone Encounter (Signed)
   Patient Name: Janet Olson  DOB: 1970/10/14 MRN: [id number]  Primary Cardiologist: Julien Nordmann, MD  Chart reviewed as part of pre-operative protocol coverage.   Please see enclosed last office note from patient's primary cardiologist Dr. Mariah Milling regarding hold time for Eliquis.   Napoleon Form, Leodis Rains, NP 02/25/2024, 2:45 PM

## 2024-02-25 NOTE — Telephone Encounter (Signed)
   Pre-operative Risk Assessment    Patient Name: Janet Olson  DOB: 1970/04/26 MRN: [id number]   Date of last office visit: unknown Date of next office visit: unknown  Request for Surgical Clearance    Procedure:  Dental Extraction - Amount of Teeth to be Pulled:  4 teeth removed   Date of Surgery:  Clearance TBD                                Surgeon:  TBD Surgeon's Group or Practice Name:  [facility] Phone number:  [phone] Fax number:  [phone]   Type of Clearance Requested:   - Pharmacy:  Hold Apixaban (Eliquis) Hold  and follow directions   Type of Anesthesia:  General    Additional requests/questions:    Signed, Shawna Orleans   02/25/2024, 2:32 PM

## 2024-02-25 NOTE — Telephone Encounter (Signed)
 Clearance formed signed by Dr. Mariah Milling and faxed to [facility].

## 2024-02-25 NOTE — Telephone Encounter (Signed)
 Spoke with [facility] and requested that they please resend the request for clearance. Confirmed fax number and she will get that sent to [place] today

## 2024-03-03 ENCOUNTER — Other Ambulatory Visit: Payer: Self-pay | Admitting: Nephrology

## 2024-03-03 DIAGNOSIS — R188 Other ascites: Secondary | ICD-10-CM

## 2024-03-03 NOTE — Telephone Encounter (Signed)
 Spoke with [facility]. They received all other information for clearance and now only needs a copy of her EKG. She provided fax number of [phone]. No other needs at this time. Sending that over to them and will close this encounter.

## 2024-03-05 ENCOUNTER — Other Ambulatory Visit: Payer: Self-pay | Admitting: Nephrology

## 2024-03-05 ENCOUNTER — Ambulatory Visit
Admission: RE | Admit: 2024-03-05 | Discharge: 2024-03-05 | Disposition: A | Discharge status: Home / Self Care | Source: Ambulatory Visit | Attending: Nephrology | Admitting: Nephrology

## 2024-03-05 DIAGNOSIS — R188 Other ascites: Secondary | ICD-10-CM

## 2024-03-19 ENCOUNTER — Ambulatory Visit: Payer: 59 | Admitting: Student

## 2024-04-09 ENCOUNTER — Ambulatory Visit (INDEPENDENT_AMBULATORY_CARE_PROVIDER_SITE_OTHER): Payer: 59

## 2024-04-09 ENCOUNTER — Telehealth (INDEPENDENT_AMBULATORY_CARE_PROVIDER_SITE_OTHER): Payer: Self-pay

## 2024-04-09 ENCOUNTER — Ambulatory Visit (INDEPENDENT_AMBULATORY_CARE_PROVIDER_SITE_OTHER): Payer: 59 | Admitting: Nurse Practitioner

## 2024-04-09 ENCOUNTER — Encounter (INDEPENDENT_AMBULATORY_CARE_PROVIDER_SITE_OTHER): Payer: Self-pay | Admitting: Nurse Practitioner

## 2024-04-09 VITALS — BP 135/86 | HR 77 | Resp 16 | Wt 211.8 lb

## 2024-04-09 DIAGNOSIS — N186 End stage renal disease: Secondary | ICD-10-CM

## 2024-04-09 DIAGNOSIS — I10 Essential (primary) hypertension: Secondary | ICD-10-CM | POA: Diagnosis not present

## 2024-04-09 DIAGNOSIS — E782 Mixed hyperlipidemia: Secondary | ICD-10-CM

## 2024-04-09 NOTE — Telephone Encounter (Signed)
 I attempted to contact the patient to be scheduled for a left arm fistulagram with Dr. Gilda Crease. The person answered the phone and when I said who I was and where I was calling from they hung up. I then called the back and it went to voicemail that was full. I then called the home number and as I was leaving a message it was picked up and hung up. Unable to leave a message.

## 2024-04-10 ENCOUNTER — Telehealth (INDEPENDENT_AMBULATORY_CARE_PROVIDER_SITE_OTHER): Payer: Self-pay

## 2024-04-10 NOTE — Telephone Encounter (Signed)
 Spoke with the patient and she is scheduled with Dr. Gilda Crease on 04/21/24 with a 1:00 pm arrival time to the [facility] for a left arm fistulagram. Pre-procedure instructions were discussed and will be sent to Mychart and mailed.

## 2024-04-12 ENCOUNTER — Encounter (INDEPENDENT_AMBULATORY_CARE_PROVIDER_SITE_OTHER): Payer: Self-pay | Admitting: Nurse Practitioner

## 2024-04-12 NOTE — H&P (View-Only) (Signed)
 Subjective:    Patient ID: Janet Olson, female    DOB: 14-Jun-1970, 54 y.o.   MRN: [id number] Chief Complaint  Patient presents with   Follow-up    Ref Lateef consult HDA    The patient returns to the office for follow up regarding a problem with their dialysis access.   The patient notes a significant increase in problems with dialysis.  It is reported that adequate dialysis is not being achieved.    The patient denies hand pain or other symptoms consistent with steal phenomena.  No significant arm swelling.  The patient denies redness or swelling at the access site. The patient denies fever or chills at home or while on dialysis.  No recent shortening of the patient's walking distance or new symptoms consistent with claudication.  No history of rest pain symptoms. No new ulcers or wounds of the lower extremities have occurred.  The patient denies amaurosis fugax or recent TIA symptoms. There are no recent neurological changes noted. There is no history of DVT, PE or superficial thrombophlebitis. No recent episodes of angina or shortness of breath documented.   Duplex ultrasound of the AV access shows a patent access.  The previously noted stenosis is significantly increased compared to last study.  Flow volume today is 2420 cc/min (previous flow volume was 2804 cc/min)    Review of Systems  Hematological:  Bruises/bleeds easily.  All other systems reviewed and are negative.      Objective:   Physical Exam Vitals reviewed.  HENT:     Head: Normocephalic.  Cardiovascular:     Rate and Rhythm: Normal rate.     Pulses: Normal pulses.  Pulmonary:     Effort: Pulmonary effort is normal.  Skin:    General: Skin is warm and dry.  Neurological:     Mental Status: She is alert and oriented to person, place, and time.  Psychiatric:        Mood and Affect: Mood normal.        Behavior: Behavior normal.        Thought Content: Thought content normal.        Judgment:  Judgment normal.     BP 135/86   Pulse 77   Resp 16   Wt 211 lb 12.8 oz (96.1 kg)   LMP 03/03/2018 (Approximate)   BMI 34.19 kg/m   Past Medical History:  Diagnosis Date   (HFpEF) heart failure with preserved ejection fraction (HCC)    a. 2015 Echo: Nl EF. Mild to mod LVH; b. 04/2017 Echo: EF 55-60% Gr1 DD; c. 12/2018 Echo: EF 55-60%; d. 02/2020 Echo: EF 50-55%; e. 10/2020 Echo: EF 55-60%, mild LVH, mildly enlarged RV, sev elev RVSP, BAE, mild MR; 12/2020 Echo: EF 65%, sev dil RA, dil LA, RVSP 29.41mmHg, MR/PR.   Abdominal ascites    Acute hypoxemic respiratory failure (HCC) 01/03/2019   Acute renal failure (ARF) (HCC) 05/17/2017   Acute respiratory failure (HCC) 09/25/2018   Anemia    Anxiety    Arthritis    Asthma    Atrial flutter (HCC)    a. Dx 03/2022-->CHA2DS2VASc = 3-->eliquis.   Cardiomegaly    COPD (chronic obstructive pulmonary disease) (HCC)    Depression    Dialysis patient (HCC)    MON, WED , FRI   Dyspnea    ESRD (end stage renal disease) (HCC)    GERD (gastroesophageal reflux disease)    Headache    Migraines   Hypertension  Nonrheumatic tricuspid (valve) insufficiency    Pericardial effusion 01/26/2019   Respiratory failure with hypoxia (HCC) 04/21/2018   Ulcer    gastric   Vitamin D deficiency     Social History   Socioeconomic History   Marital status: Single    Spouse name: Not on file   Number of children: Not on file   Years of education: Not on file   Highest education level: Not on file  Occupational History   Not on file  Tobacco Use   Smoking status: Every Day    Current packs/day: 0.50    Average packs/day: 0.5 packs/day for 30.0 years (15.0 ttl pk-yrs)    Types: Cigarettes   Smokeless tobacco: Never  Vaping Use   Vaping status: Never Used  Substance and Sexual Activity   Alcohol use: No   Drug use: No   Sexual activity: Not Currently  Other Topics Concern   Not on file  Social History Narrative   Not on file   Social  Drivers of Health   Financial Resource Strain: Low Risk  (01/09/2024)   Received from [facility]   Overall Financial Resource Strain (CARDIA)    Difficulty of Paying Living Expenses: Not hard at all  Food Insecurity: No Food Insecurity (01/23/2024)   Hunger Vital Sign    Worried About Running Out of Food in the Last Year: Never true    Ran Out of Food in the Last Year: Never true  Transportation Needs: No Transportation Needs (01/23/2024)   PRAPARE - Administrator, Civil Service (Medical): No    Lack of Transportation (Non-Medical): No  Physical Activity: Not on file  Stress: Not on file  Social Connections: Moderately Integrated (01/23/2024)   Social Connection and Isolation Panel [NHANES]    Frequency of Communication with Friends and Family: Twice a week    Frequency of Social Gatherings with Friends and Family: More than three times a week    Attends Religious Services: More than 4 times per year    Active Member of [employer] or Organizations: Yes    Attends Banker Meetings: Never    Marital Status: Divorced  Catering manager Violence: Not At Risk (01/23/2024)   Humiliation, Afraid, Rape, and Kick questionnaire    Fear of Current or Ex-Partner: No    Emotionally Abused: No    Physically Abused: No    Sexually Abused: No    Past Surgical History:  Procedure Laterality Date   A/V FISTULAGRAM Left 04/17/2018   Procedure: A/V FISTULAGRAM;  Surgeon: Celso College, MD;  Location: [facility];  Service: Cardiovascular;  Laterality: Left;   A/V FISTULAGRAM Left 05/27/2018   Procedure: A/V FISTULAGRAM;  Surgeon: Jackquelyn Mass, MD;  Location: [facility];  Service: Cardiovascular;  Laterality: Left;   A/V FISTULAGRAM Left 07/05/2020   Procedure: A/V FISTULAGRAM;  Surgeon: Jackquelyn Mass, MD;  Location: [facility];  Service: Cardiovascular;  Laterality: Left;   A/V FISTULAGRAM Left 06/26/2022   Procedure: A/V Fistulagram;  Surgeon:  Jackquelyn Mass, MD;  Location: [facility];  Service: Cardiovascular;  Laterality: Left;   AV FISTULA PLACEMENT Left 08/08/2017   Procedure: ARTERIOVENOUS (AV) FISTULA CREATION ( BRACHIOCEPHALIC );  Surgeon: Celso College, MD;  Location: [facility];  Service: Vascular;  Laterality: Left;   AV FISTULA PLACEMENT Left 08/05/2020   Procedure: INSERTION OF ARTERIOVENOUS (AV) GRAFT ARM(BRACHIAL AXILLARY);  Surgeon: Jackquelyn Mass, MD;  Location: [facility];  Service: Vascular;  Laterality: Left;   CARDIOVERSION N/A 05/01/2022   Procedure: CARDIOVERSION;  Surgeon: Devorah Fonder, MD;  Location: [facility];  Service: Cardiovascular;  Laterality: N/A;   CESAREAN SECTION     x 4 (1988, 1991, 1997, 2000 )   COLONOSCOPY WITH PROPOFOL N/A 04/24/2018   Procedure: COLONOSCOPY WITH PROPOFOL;  Surgeon: Selena Daily, MD;  Location: [facility];  Service: Gastroenterology;  Laterality: N/A;   COLONOSCOPY WITH PROPOFOL N/A 12/06/2022   Procedure: COLONOSCOPY WITH PROPOFOL;  Surgeon: Selena Daily, MD;  Location: [facility] ENDOSCOPY;  Service: Gastroenterology;  Laterality: N/A;   DIALYSIS/PERMA CATHETER INSERTION N/A 07/16/2017   Procedure: Dialysis/Perma Catheter Insertion;  Surgeon: Jackquelyn Mass, MD;  Location: [facility];  Service: Cardiovascular;  Laterality: N/A;   DIALYSIS/PERMA CATHETER REMOVAL N/A 10/29/2017   Procedure: DIALYSIS/PERMA CATHETER REMOVAL;  Surgeon: Jackquelyn Mass, MD;  Location: [facility];  Service: Cardiovascular;  Laterality: N/A;   DIALYSIS/PERMA CATHETER REMOVAL N/A 11/03/2020   Procedure: DIALYSIS/PERMA CATHETER REMOVAL;  Surgeon: Celso College, MD;  Location: [facility];  Service: Cardiovascular;  Laterality: N/A;   ESOPHAGOGASTRODUODENOSCOPY (EGD) WITH PROPOFOL N/A 12/06/2022   Procedure: ESOPHAGOGASTRODUODENOSCOPY (EGD) WITH PROPOFOL;  Surgeon: Selena Daily, MD;  Location: [facility]COPY;  Service: Gastroenterology;   Laterality: N/A;   excision of lymph nodes Bilateral 2014   bilateral under arms.   PARATHYROID IMPLANT REMOVAL     REMOVAL OF A DIALYSIS CATHETER Left 08/05/2020   Procedure: REMOVAL OF A DIALYSIS CATHETER ( FISTULA );  Surgeon: Jackquelyn Mass, MD;  Location: [facility];  Service: Vascular;  Laterality: Left;   SHOULDER ARTHROSCOPY WITH SUBACROMIAL DECOMPRESSION AND OPEN ROTATOR C Right 01/08/2022   Procedure: Right shoulder arthroscopic subscapularis repair, rotator cuff repair, subacromial decompression, and distal clavicle excision;  Surgeon: Lorri Rota, MD;  Location: [facility];  Service: Orthopedics;  Laterality: Right;   TEE WITHOUT CARDIOVERSION N/A 05/01/2022   Procedure: TRANSESOPHAGEAL ECHOCARDIOGRAM (TEE);  Surgeon: Devorah Fonder, MD;  Location: [facility];  Service: Cardiovascular;  Laterality: N/A;    Family History  Problem Relation Age of Onset   Diabetes Mother     No Known Allergies     Latest Ref Rng & Units 01/24/2024    3:20 PM 01/23/2024    6:34 AM 01/22/2024    4:33 PM  CBC  WBC 4.0 - 10.5 K/uL 6.1  5.2  5.5   Hemoglobin 12.0 - 15.0 g/dL 9.7  9.5  16.1   Hematocrit 36.0 - 46.0 % 31.6  31.0  32.0   Platelets 150 - 400 K/uL 168  166  174       CMP     Component Value Date/Time   NA 138 01/26/2024 0516   NA 139 09/23/2014 1741   K 4.9 01/26/2024 0516   K 4.2 09/23/2014 1741   CL 99 01/26/2024 0516   CL 112 (H) 09/23/2014 1741   CO2 23 01/26/2024 0516   CO2 21 09/23/2014 1741   GLUCOSE 160 (H) 01/26/2024 0516   GLUCOSE 82 09/23/2014 1741   BUN 80 (H) 01/26/2024 0516   BUN 18 09/23/2014 1741   CREATININE 9.94 (H) 01/26/2024 0516   CREATININE 1.41 (H) 09/23/2014 1741   CALCIUM 7.1 (L) 01/26/2024 0516   CALCIUM 8.4 (L) 01/03/2019 0243   PROT 7.1 01/22/2024 1633   PROT 7.9 05/19/2021 0932   PROT 7.5 09/23/2014 1741   ALBUMIN 3.5 01/24/2024  1520   ALBUMIN 3.9 05/19/2021 0932   ALBUMIN 3.2 (L) 09/23/2014 1741   AST 23 01/22/2024 1633   AST 15  09/23/2014 1741   ALT 21 01/22/2024 1633   ALT 32 09/23/2014 1741   ALKPHOS 175 (H) 01/22/2024 1633   ALKPHOS 65 09/23/2014 1741   BILITOT 1.3 (H) 01/22/2024 1633   BILITOT 0.3 05/19/2021 0932   BILITOT 0.2 09/23/2014 1741   GFRNONAA 4 (L) 01/26/2024 0516   GFRNONAA 43 (L) 09/23/2014 1741   GFRNONAA 43 (L) 11/20/2012 1129     No results found.     Assessment & Plan:   1. ESRD (end stage renal disease) (HCC) (Primary) Recommend:  The patient is experiencing increasing problems with their dialysis access.  Patient should have a fistulagram with the intention for intervention.  The intention for intervention is to restore appropriate flow and prevent thrombosis and possible loss of the access.  As well as improve the quality of dialysis therapy.  The risks, benefits and alternative therapies were reviewed in detail with the patient.  All questions were answered.  The patient agrees to proceed with angio/intervention.    The patient will follow up with me in the office after the procedure.   2. Benign essential HTN Continue antihypertensive medications as already ordered, these medications have been reviewed and there are no changes at this time.  3. Mixed hyperlipidemia Continue statin as ordered and reviewed, no changes at this time   Current Outpatient Medications on File Prior to Visit  Medication Sig Dispense Refill   acetaminophen (TYLENOL) 500 MG tablet Take 1,000 mg by mouth every 6 (six) hours as needed for mild pain (pain score 1-3).     albuterol (PROVENTIL) (2.5 MG/3ML) 0.083% nebulizer solution Take 2.5 mg by nebulization every 6 (six) hours as needed for wheezing or shortness of breath.      albuterol (VENTOLIN HFA) 108 (90 Base) MCG/ACT inhaler Inhale 2 puffs into the lungs every 6 (six) hours as needed for wheezing or shortness of breath.      benzonatate (TESSALON) 200 MG capsule Take 400 mg by mouth 3 (three) times daily as needed for cough.     BREZTRI  AEROSPHERE 160-9-4.8 MCG/ACT AERO SMARTSIG:2 Puff(s) By Mouth Twice Daily     budesonide (PULMICORT) 0.5 MG/2ML nebulizer solution Take 0.5 mg by nebulization daily as needed.     CAPLYTA 10.5 MG CAPS Take 1 capsule by mouth daily.     cyclobenzaprine (FLEXERIL) 5 MG tablet Take 5 mg by mouth 3 (three) times daily.     Doxercalciferol (HECTOROL IV) Inject into the vein.     ELIQUIS 5 MG TABS tablet TAKE 1 TABLET(5 MG) BY MOUTH TWICE DAILY 60 tablet 11   furosemide (LASIX) 40 MG tablet Take 40 mg by mouth daily.     Heparin Sodium, Porcine, (HEPARIN LOCK FLUSH IJ) Inject 1 Dose as directed every Monday, Wednesday, and Friday. At dialysis     hydrOXYzine (ATARAX) 25 MG tablet Take by mouth.     lanthanum (FOSRENOL) 1000 MG chewable tablet Chew 1,000 mg by mouth 3 (three) times daily.     lidocaine-prilocaine (EMLA) cream 1 application as needed (prior to port being accessed).     metoprolol tartrate (LOPRESSOR) 50 MG tablet Take 1 tablet (50 mg total) by mouth 2 (two) times daily.     midodrine (PROAMATINE) 10 MG tablet Take 2 tablets (20 mg total) by mouth See admin instructions. Take 1 hour prior to dialysis.  108 tablet 3   multivitamin (RENA-VIT) TABS tablet Take 1 tablet by mouth daily.   1   nystatin (MYCOSTATIN) 100000 UNIT/ML suspension Take 2 mLs by mouth daily as needed.     pantoprazole (PROTONIX) 40 MG tablet Take 1 tablet (40 mg total) by mouth daily. 30 tablet 0   patiromer (VELTASSA) 8.4 g packet Take 8.4 g by mouth daily.     pregabalin (LYRICA) 25 MG capsule Take 75 mg by mouth daily.     traMADol (ULTRAM) 50 MG tablet Take 50 mg by mouth every 6 (six) hours as needed.     Vitamin D, Ergocalciferol, (DRISDOL) 1.25 MG (50000 UNIT) CAPS capsule Take 50,000 Units by mouth once a week.     nicotine (NICODERM CQ - DOSED IN MG/24 HOURS) 21 mg/24hr patch Place 1 patch (21 mg total) onto the skin daily. (Patient not taking: Reported on 04/09/2024) 28 patch 0   QUEtiapine (SEROQUEL) 100 MG  tablet Take 100-200 mg by mouth at bedtime as needed (sleep). (Patient not taking: Reported on 02/21/2024)     No current facility-administered medications on file prior to visit.    There are no Patient Instructions on file for this visit. No follow-ups on file.   Lashona Schaaf E Royalti Schauf, NP

## 2024-04-12 NOTE — Progress Notes (Signed)
 Subjective:    Patient ID: Janet Olson, female    DOB: 14-Jun-1970, 54 y.o.   MRN: [id number] Chief Complaint  Patient presents with   Follow-up    Ref Lateef consult HDA    The patient returns to the office for follow up regarding a problem with their dialysis access.   The patient notes a significant increase in problems with dialysis.  It is reported that adequate dialysis is not being achieved.    The patient denies hand pain or other symptoms consistent with steal phenomena.  No significant arm swelling.  The patient denies redness or swelling at the access site. The patient denies fever or chills at home or while on dialysis.  No recent shortening of the patient's walking distance or new symptoms consistent with claudication.  No history of rest pain symptoms. No new ulcers or wounds of the lower extremities have occurred.  The patient denies amaurosis fugax or recent TIA symptoms. There are no recent neurological changes noted. There is no history of DVT, PE or superficial thrombophlebitis. No recent episodes of angina or shortness of breath documented.   Duplex ultrasound of the AV access shows a patent access.  The previously noted stenosis is significantly increased compared to last study.  Flow volume today is 2420 cc/min (previous flow volume was 2804 cc/min)    Review of Systems  Hematological:  Bruises/bleeds easily.  All other systems reviewed and are negative.      Objective:   Physical Exam Vitals reviewed.  HENT:     Head: Normocephalic.  Cardiovascular:     Rate and Rhythm: Normal rate.     Pulses: Normal pulses.  Pulmonary:     Effort: Pulmonary effort is normal.  Skin:    General: Skin is warm and dry.  Neurological:     Mental Status: She is alert and oriented to person, place, and time.  Psychiatric:        Mood and Affect: Mood normal.        Behavior: Behavior normal.        Thought Content: Thought content normal.        Judgment:  Judgment normal.     BP 135/86   Pulse 77   Resp 16   Wt 211 lb 12.8 oz (96.1 kg)   LMP 03/03/2018 (Approximate)   BMI 34.19 kg/m   Past Medical History:  Diagnosis Date   (HFpEF) heart failure with preserved ejection fraction (HCC)    a. 2015 Echo: Nl EF. Mild to mod LVH; b. 04/2017 Echo: EF 55-60% Gr1 DD; c. 12/2018 Echo: EF 55-60%; d. 02/2020 Echo: EF 50-55%; e. 10/2020 Echo: EF 55-60%, mild LVH, mildly enlarged RV, sev elev RVSP, BAE, mild MR; 12/2020 Echo: EF 65%, sev dil RA, dil LA, RVSP 29.41mmHg, MR/PR.   Abdominal ascites    Acute hypoxemic respiratory failure (HCC) 01/03/2019   Acute renal failure (ARF) (HCC) 05/17/2017   Acute respiratory failure (HCC) 09/25/2018   Anemia    Anxiety    Arthritis    Asthma    Atrial flutter (HCC)    a. Dx 03/2022-->CHA2DS2VASc = 3-->eliquis.   Cardiomegaly    COPD (chronic obstructive pulmonary disease) (HCC)    Depression    Dialysis patient (HCC)    MON, WED , FRI   Dyspnea    ESRD (end stage renal disease) (HCC)    GERD (gastroesophageal reflux disease)    Headache    Migraines   Hypertension  Nonrheumatic tricuspid (valve) insufficiency    Pericardial effusion 01/26/2019   Respiratory failure with hypoxia (HCC) 04/21/2018   Ulcer    gastric   Vitamin D deficiency     Social History   Socioeconomic History   Marital status: Single    Spouse name: Not on file   Number of children: Not on file   Years of education: Not on file   Highest education level: Not on file  Occupational History   Not on file  Tobacco Use   Smoking status: Every Day    Current packs/day: 0.50    Average packs/day: 0.5 packs/day for 30.0 years (15.0 ttl pk-yrs)    Types: Cigarettes   Smokeless tobacco: Never  Vaping Use   Vaping status: Never Used  Substance and Sexual Activity   Alcohol use: No   Drug use: No   Sexual activity: Not Currently  Other Topics Concern   Not on file  Social History Narrative   Not on file   Social  Drivers of Health   Financial Resource Strain: Low Risk  (01/09/2024)   Received from [facility]   Overall Financial Resource Strain (CARDIA)    Difficulty of Paying Living Expenses: Not hard at all  Food Insecurity: No Food Insecurity (01/23/2024)   Hunger Vital Sign    Worried About Running Out of Food in the Last Year: Never true    Ran Out of Food in the Last Year: Never true  Transportation Needs: No Transportation Needs (01/23/2024)   PRAPARE - Administrator, Civil Service (Medical): No    Lack of Transportation (Non-Medical): No  Physical Activity: Not on file  Stress: Not on file  Social Connections: Moderately Integrated (01/23/2024)   Social Connection and Isolation Panel [NHANES]    Frequency of Communication with Friends and Family: Twice a week    Frequency of Social Gatherings with Friends and Family: More than three times a week    Attends Religious Services: More than 4 times per year    Active Member of [employer] or Organizations: Yes    Attends Banker Meetings: Never    Marital Status: Divorced  Catering manager Violence: Not At Risk (01/23/2024)   Humiliation, Afraid, Rape, and Kick questionnaire    Fear of Current or Ex-Partner: No    Emotionally Abused: No    Physically Abused: No    Sexually Abused: No    Past Surgical History:  Procedure Laterality Date   A/V FISTULAGRAM Left 04/17/2018   Procedure: A/V FISTULAGRAM;  Surgeon: Celso College, MD;  Location: [facility];  Service: Cardiovascular;  Laterality: Left;   A/V FISTULAGRAM Left 05/27/2018   Procedure: A/V FISTULAGRAM;  Surgeon: Jackquelyn Mass, MD;  Location: [facility];  Service: Cardiovascular;  Laterality: Left;   A/V FISTULAGRAM Left 07/05/2020   Procedure: A/V FISTULAGRAM;  Surgeon: Jackquelyn Mass, MD;  Location: [facility];  Service: Cardiovascular;  Laterality: Left;   A/V FISTULAGRAM Left 06/26/2022   Procedure: A/V Fistulagram;  Surgeon:  Jackquelyn Mass, MD;  Location: [facility];  Service: Cardiovascular;  Laterality: Left;   AV FISTULA PLACEMENT Left 08/08/2017   Procedure: ARTERIOVENOUS (AV) FISTULA CREATION ( BRACHIOCEPHALIC );  Surgeon: Celso College, MD;  Location: [facility];  Service: Vascular;  Laterality: Left;   AV FISTULA PLACEMENT Left 08/05/2020   Procedure: INSERTION OF ARTERIOVENOUS (AV) GRAFT ARM(BRACHIAL AXILLARY);  Surgeon: Jackquelyn Mass, MD;  Location: [facility];  Service: Vascular;  Laterality: Left;   CARDIOVERSION N/A 05/01/2022   Procedure: CARDIOVERSION;  Surgeon: Devorah Fonder, MD;  Location: [facility];  Service: Cardiovascular;  Laterality: N/A;   CESAREAN SECTION     x 4 (1988, 1991, 1997, 2000 )   COLONOSCOPY WITH PROPOFOL N/A 04/24/2018   Procedure: COLONOSCOPY WITH PROPOFOL;  Surgeon: Selena Daily, MD;  Location: [facility];  Service: Gastroenterology;  Laterality: N/A;   COLONOSCOPY WITH PROPOFOL N/A 12/06/2022   Procedure: COLONOSCOPY WITH PROPOFOL;  Surgeon: Selena Daily, MD;  Location: [facility] ENDOSCOPY;  Service: Gastroenterology;  Laterality: N/A;   DIALYSIS/PERMA CATHETER INSERTION N/A 07/16/2017   Procedure: Dialysis/Perma Catheter Insertion;  Surgeon: Jackquelyn Mass, MD;  Location: [facility];  Service: Cardiovascular;  Laterality: N/A;   DIALYSIS/PERMA CATHETER REMOVAL N/A 10/29/2017   Procedure: DIALYSIS/PERMA CATHETER REMOVAL;  Surgeon: Jackquelyn Mass, MD;  Location: [facility];  Service: Cardiovascular;  Laterality: N/A;   DIALYSIS/PERMA CATHETER REMOVAL N/A 11/03/2020   Procedure: DIALYSIS/PERMA CATHETER REMOVAL;  Surgeon: Celso College, MD;  Location: [facility];  Service: Cardiovascular;  Laterality: N/A;   ESOPHAGOGASTRODUODENOSCOPY (EGD) WITH PROPOFOL N/A 12/06/2022   Procedure: ESOPHAGOGASTRODUODENOSCOPY (EGD) WITH PROPOFOL;  Surgeon: Selena Daily, MD;  Location: [facility] ENDOSCOPY;  Service: Gastroenterology;   Laterality: N/A;   excision of lymph nodes Bilateral 2014   bilateral under arms.   PARATHYROID IMPLANT REMOVAL     REMOVAL OF A DIALYSIS CATHETER Left 08/05/2020   Procedure: REMOVAL OF A DIALYSIS CATHETER ( FISTULA );  Surgeon: Jackquelyn Mass, MD;  Location: [facility];  Service: Vascular;  Laterality: Left;   SHOULDER ARTHROSCOPY WITH SUBACROMIAL DECOMPRESSION AND OPEN ROTATOR C Right 01/08/2022   Procedure: Right shoulder arthroscopic subscapularis repair, rotator cuff repair, subacromial decompression, and distal clavicle excision;  Surgeon: Lorri Rota, MD;  Location: [facility];  Service: Orthopedics;  Laterality: Right;   TEE WITHOUT CARDIOVERSION N/A 05/01/2022   Procedure: TRANSESOPHAGEAL ECHOCARDIOGRAM (TEE);  Surgeon: Devorah Fonder, MD;  Location: [facility];  Service: Cardiovascular;  Laterality: N/A;    Family History  Problem Relation Age of Onset   Diabetes Mother     No Known Allergies     Latest Ref Rng & Units 01/24/2024    3:20 PM 01/23/2024    6:34 AM 01/22/2024    4:33 PM  CBC  WBC 4.0 - 10.5 K/uL 6.1  5.2  5.5   Hemoglobin 12.0 - 15.0 g/dL 9.7  9.5  16.1   Hematocrit 36.0 - 46.0 % 31.6  31.0  32.0   Platelets 150 - 400 K/uL 168  166  174       CMP     Component Value Date/Time   NA 138 01/26/2024 0516   NA 139 09/23/2014 1741   K 4.9 01/26/2024 0516   K 4.2 09/23/2014 1741   CL 99 01/26/2024 0516   CL 112 (H) 09/23/2014 1741   CO2 23 01/26/2024 0516   CO2 21 09/23/2014 1741   GLUCOSE 160 (H) 01/26/2024 0516   GLUCOSE 82 09/23/2014 1741   BUN 80 (H) 01/26/2024 0516   BUN 18 09/23/2014 1741   CREATININE 9.94 (H) 01/26/2024 0516   CREATININE 1.41 (H) 09/23/2014 1741   CALCIUM 7.1 (L) 01/26/2024 0516   CALCIUM 8.4 (L) 01/03/2019 0243   PROT 7.1 01/22/2024 1633   PROT 7.9 05/19/2021 0932   PROT 7.5 09/23/2014 1741   ALBUMIN 3.5 01/24/2024  1520   ALBUMIN 3.9 05/19/2021 0932   ALBUMIN 3.2 (L) 09/23/2014 1741   AST 23 01/22/2024 1633   AST 15  09/23/2014 1741   ALT 21 01/22/2024 1633   ALT 32 09/23/2014 1741   ALKPHOS 175 (H) 01/22/2024 1633   ALKPHOS 65 09/23/2014 1741   BILITOT 1.3 (H) 01/22/2024 1633   BILITOT 0.3 05/19/2021 0932   BILITOT 0.2 09/23/2014 1741   GFRNONAA 4 (L) 01/26/2024 0516   GFRNONAA 43 (L) 09/23/2014 1741   GFRNONAA 43 (L) 11/20/2012 1129     No results found.     Assessment & Plan:   1. ESRD (end stage renal disease) (HCC) (Primary) Recommend:  The patient is experiencing increasing problems with their dialysis access.  Patient should have a fistulagram with the intention for intervention.  The intention for intervention is to restore appropriate flow and prevent thrombosis and possible loss of the access.  As well as improve the quality of dialysis therapy.  The risks, benefits and alternative therapies were reviewed in detail with the patient.  All questions were answered.  The patient agrees to proceed with angio/intervention.    The patient will follow up with me in the office after the procedure.   2. Benign essential HTN Continue antihypertensive medications as already ordered, these medications have been reviewed and there are no changes at this time.  3. Mixed hyperlipidemia Continue statin as ordered and reviewed, no changes at this time   Current Outpatient Medications on File Prior to Visit  Medication Sig Dispense Refill   acetaminophen (TYLENOL) 500 MG tablet Take 1,000 mg by mouth every 6 (six) hours as needed for mild pain (pain score 1-3).     albuterol (PROVENTIL) (2.5 MG/3ML) 0.083% nebulizer solution Take 2.5 mg by nebulization every 6 (six) hours as needed for wheezing or shortness of breath.      albuterol (VENTOLIN HFA) 108 (90 Base) MCG/ACT inhaler Inhale 2 puffs into the lungs every 6 (six) hours as needed for wheezing or shortness of breath.      benzonatate (TESSALON) 200 MG capsule Take 400 mg by mouth 3 (three) times daily as needed for cough.     BREZTRI  AEROSPHERE 160-9-4.8 MCG/ACT AERO SMARTSIG:2 Puff(s) By Mouth Twice Daily     budesonide (PULMICORT) 0.5 MG/2ML nebulizer solution Take 0.5 mg by nebulization daily as needed.     CAPLYTA 10.5 MG CAPS Take 1 capsule by mouth daily.     cyclobenzaprine (FLEXERIL) 5 MG tablet Take 5 mg by mouth 3 (three) times daily.     Doxercalciferol (HECTOROL IV) Inject into the vein.     ELIQUIS 5 MG TABS tablet TAKE 1 TABLET(5 MG) BY MOUTH TWICE DAILY 60 tablet 11   furosemide (LASIX) 40 MG tablet Take 40 mg by mouth daily.     Heparin Sodium, Porcine, (HEPARIN LOCK FLUSH IJ) Inject 1 Dose as directed every Monday, Wednesday, and Friday. At dialysis     hydrOXYzine (ATARAX) 25 MG tablet Take by mouth.     lanthanum (FOSRENOL) 1000 MG chewable tablet Chew 1,000 mg by mouth 3 (three) times daily.     lidocaine-prilocaine (EMLA) cream 1 application as needed (prior to port being accessed).     metoprolol tartrate (LOPRESSOR) 50 MG tablet Take 1 tablet (50 mg total) by mouth 2 (two) times daily.     midodrine (PROAMATINE) 10 MG tablet Take 2 tablets (20 mg total) by mouth See admin instructions. Take 1 hour prior to dialysis.  108 tablet 3   multivitamin (RENA-VIT) TABS tablet Take 1 tablet by mouth daily.   1   nystatin (MYCOSTATIN) 100000 UNIT/ML suspension Take 2 mLs by mouth daily as needed.     pantoprazole (PROTONIX) 40 MG tablet Take 1 tablet (40 mg total) by mouth daily. 30 tablet 0   patiromer (VELTASSA) 8.4 g packet Take 8.4 g by mouth daily.     pregabalin (LYRICA) 25 MG capsule Take 75 mg by mouth daily.     traMADol (ULTRAM) 50 MG tablet Take 50 mg by mouth every 6 (six) hours as needed.     Vitamin D, Ergocalciferol, (DRISDOL) 1.25 MG (50000 UNIT) CAPS capsule Take 50,000 Units by mouth once a week.     nicotine (NICODERM CQ - DOSED IN MG/24 HOURS) 21 mg/24hr patch Place 1 patch (21 mg total) onto the skin daily. (Patient not taking: Reported on 04/09/2024) 28 patch 0   QUEtiapine (SEROQUEL) 100 MG  tablet Take 100-200 mg by mouth at bedtime as needed (sleep). (Patient not taking: Reported on 02/21/2024)     No current facility-administered medications on file prior to visit.    There are no Patient Instructions on file for this visit. No follow-ups on file.   Lashona Schaaf E Royalti Schauf, NP

## 2024-04-21 ENCOUNTER — Other Ambulatory Visit: Payer: Self-pay

## 2024-04-21 ENCOUNTER — Encounter
Admission: RE | Disposition: A | Discharge status: Home / Self Care | Payer: Self-pay | Source: Home / Self Care | Attending: Vascular Surgery

## 2024-04-21 ENCOUNTER — Encounter: Payer: Self-pay | Admitting: Vascular Surgery

## 2024-04-21 ENCOUNTER — Ambulatory Visit
Admission: RE | Admit: 2024-04-21 | Discharge: 2024-04-21 | Disposition: A | Discharge status: Home / Self Care | Attending: Vascular Surgery | Admitting: Vascular Surgery

## 2024-04-21 DIAGNOSIS — T82858A Stenosis of vascular prosthetic devices, implants and grafts, initial encounter: Secondary | ICD-10-CM | POA: Diagnosis not present

## 2024-04-21 DIAGNOSIS — N186 End stage renal disease: Secondary | ICD-10-CM | POA: Insufficient documentation

## 2024-04-21 DIAGNOSIS — I871 Compression of vein: Secondary | ICD-10-CM | POA: Diagnosis not present

## 2024-04-21 DIAGNOSIS — Z992 Dependence on renal dialysis: Secondary | ICD-10-CM | POA: Insufficient documentation

## 2024-04-21 DIAGNOSIS — F17210 Nicotine dependence, cigarettes, uncomplicated: Secondary | ICD-10-CM | POA: Diagnosis not present

## 2024-04-21 DIAGNOSIS — E782 Mixed hyperlipidemia: Secondary | ICD-10-CM | POA: Insufficient documentation

## 2024-04-21 DIAGNOSIS — I5032 Chronic diastolic (congestive) heart failure: Secondary | ICD-10-CM | POA: Insufficient documentation

## 2024-04-21 DIAGNOSIS — Y832 Surgical operation with anastomosis, bypass or graft as the cause of abnormal reaction of the patient, or of later complication, without mention of misadventure at the time of the procedure: Secondary | ICD-10-CM | POA: Diagnosis not present

## 2024-04-21 DIAGNOSIS — I132 Hypertensive heart and chronic kidney disease with heart failure and with stage 5 chronic kidney disease, or end stage renal disease: Secondary | ICD-10-CM | POA: Insufficient documentation

## 2024-04-21 HISTORY — PX: A/V FISTULAGRAM: CATH118298

## 2024-04-21 LAB — POTASSIUM (ARMC VASCULAR LAB ONLY): Potassium (ARMC vascular lab): 4.6 mmol/L (ref 3.5–5.1)

## 2024-04-21 SURGERY — A/V FISTULAGRAM
Anesthesia: Moderate Sedation | Laterality: Left

## 2024-04-21 MED ORDER — MIDAZOLAM HCL 2 MG/2ML IJ SOLN
INTRAMUSCULAR | Status: DC | PRN
Start: 2024-04-21 — End: 2024-04-21
  Administered 2024-04-21 (×2): 1 mg via INTRAVENOUS

## 2024-04-21 MED ORDER — CEFAZOLIN SODIUM-DEXTROSE 1-4 GM/50ML-% IV SOLN
INTRAVENOUS | Status: AC
  Filled 2024-04-21: qty 50

## 2024-04-21 MED ORDER — ONDANSETRON HCL 4 MG/2ML IJ SOLN: 4.0000 mg | Freq: Four times a day (QID) | INTRAMUSCULAR | Status: DC | PRN

## 2024-04-21 MED ORDER — HEPARIN SODIUM (PORCINE) 1000 UNIT/ML IJ SOLN
INTRAMUSCULAR | Status: DC | PRN
  Administered 2024-04-21: 4000 [IU] via INTRAVENOUS

## 2024-04-21 MED ORDER — DIPHENHYDRAMINE HCL 50 MG/ML IJ SOLN: 50.0000 mg | Freq: Once | INTRAMUSCULAR | Status: DC | PRN

## 2024-04-21 MED ORDER — SODIUM CHLORIDE 0.9 % IV SOLN: INTRAVENOUS | Status: DC

## 2024-04-21 MED ORDER — MIDAZOLAM HCL 2 MG/ML PO SYRP: 8.0000 mg | ORAL_SOLUTION | Freq: Once | ORAL | Status: DC | PRN

## 2024-04-21 MED ORDER — MIDAZOLAM HCL 2 MG/2ML IJ SOLN
INTRAMUSCULAR | Status: AC
  Filled 2024-04-21: qty 2

## 2024-04-21 MED ORDER — CEFAZOLIN SODIUM-DEXTROSE 1-4 GM/50ML-% IV SOLN
1.0000 g | INTRAVENOUS | Status: AC
  Administered 2024-04-21: 1 g via INTRAVENOUS

## 2024-04-21 MED ORDER — METHYLPREDNISOLONE SODIUM SUCC 125 MG IJ SOLR: 125.0000 mg | Freq: Once | INTRAMUSCULAR | Status: DC | PRN

## 2024-04-21 MED ORDER — FAMOTIDINE 20 MG PO TABS: 40.0000 mg | ORAL_TABLET | Freq: Once | ORAL | Status: DC | PRN

## 2024-04-21 MED ORDER — FENTANYL CITRATE (PF) 100 MCG/2ML IJ SOLN
INTRAMUSCULAR | Status: DC | PRN
  Administered 2024-04-21: 25 ug via INTRAVENOUS
  Administered 2024-04-21: 50 ug via INTRAVENOUS

## 2024-04-21 MED ORDER — FENTANYL CITRATE PF 50 MCG/ML IJ SOSY
PREFILLED_SYRINGE | INTRAMUSCULAR | Status: AC
Start: 2024-04-21 — End: ?
  Filled 2024-04-21: qty 1

## 2024-04-21 MED ORDER — HYDROMORPHONE HCL 1 MG/ML IJ SOLN: 1.0000 mg | Freq: Once | INTRAMUSCULAR | Status: DC | PRN

## 2024-04-21 MED ORDER — LIDOCAINE HCL (PF) 1 % IJ SOLN
INTRAMUSCULAR | Status: DC | PRN
  Administered 2024-04-21: 10 mL

## 2024-04-21 MED ORDER — FENTANYL CITRATE PF 50 MCG/ML IJ SOSY
PREFILLED_SYRINGE | INTRAMUSCULAR | Status: AC
  Filled 2024-04-21: qty 1

## 2024-04-21 MED ORDER — HEPARIN (PORCINE) IN NACL 1000-0.9 UT/500ML-% IV SOLN
INTRAVENOUS | Status: DC | PRN
  Administered 2024-04-21: 500 mL

## 2024-04-21 MED ORDER — HEPARIN SODIUM (PORCINE) 1000 UNIT/ML IJ SOLN
INTRAMUSCULAR | Status: AC
  Filled 2024-04-21: qty 10

## 2024-04-21 MED ORDER — IODIXANOL 320 MG/ML IV SOLN
INTRAVENOUS | Status: DC | PRN
  Administered 2024-04-21: 15 mL

## 2024-04-21 SURGICAL SUPPLY — 15 items
BALLOON DORADO 8X100X80 (BALLOONS) IMPLANT
COVER PROBE ULTRASOUND 5X96 (MISCELLANEOUS) IMPLANT
DEVICE PRESTO INFLATION (MISCELLANEOUS) IMPLANT
DRAPE BRACHIAL (DRAPES) IMPLANT
GOWN STRL REUS W/ TWL LRG LVL3 (GOWN DISPOSABLE) ×1 IMPLANT
NDL ENTRY 21GA 7CM ECHOTIP (NEEDLE) IMPLANT
NEEDLE ENTRY 21GA 7CM ECHOTIP (NEEDLE) ×1 IMPLANT
PACK ANGIOGRAPHY (CUSTOM PROCEDURE TRAY) ×1 IMPLANT
SET INTRO CAPELLA COAXIAL (SET/KITS/TRAYS/PACK) IMPLANT
SHEATH BRITE TIP 6FRX5.5 (SHEATH) IMPLANT
SHEATH BRITE TIP 7FRX5.5 (SHEATH) IMPLANT
STENT VIABAHN 8X10X120 (Permanent Stent) IMPLANT
SUT MNCRL AB 4-0 PS2 18 (SUTURE) IMPLANT
WIRE G 018X200 V18 (WIRE) IMPLANT
WIRE SUPRACORE 190CM (WIRE) IMPLANT

## 2024-04-21 NOTE — Interval H&P Note (Signed)
 History and Physical Interval Note:  04/21/2024 2:09 PM  Janet Olson  has presented today for surgery, with the diagnosis of L arm fistulagram   End stage renal.  The various methods of treatment have been discussed with the patient and family. After consideration of risks, benefits and other options for treatment, the patient has consented to  Procedure(s): A/V Fistulagram (Left) as a surgical intervention.  The patient's history has been reviewed, patient examined, no change in status, stable for surgery.  I have reviewed the patient's chart and labs.  Questions were answered to the patient's satisfaction.     Devon Fogo

## 2024-04-21 NOTE — Discharge Instructions (Signed)
 Shuntogram, Care After Refer to this sheet in the next few weeks. These instructions provide you with information on caring for yourself after your procedure. Your health care provider may also give you more specific instructions. Your treatment has been planned according to current medical practices, but problems sometimes occur. Call your health care provider if you have any problems or questions after your procedure. What can I expect after the procedure? After your procedure, it is typical to have the following:  A small amount of discomfort in the area where the catheters were placed.  A small amount of bruising around the fistula.  Sleepiness and fatigue.  Follow these instructions at home:  Rest at home for the day following your procedure.  Do not drive or operate heavy machinery while taking pain medicine.  Take medicines only as directed by your health care provider.  Do not take baths, swim, or use a hot tub until your health care provider approves. You may shower 24 hours after the procedure or as directed by your health care provider.  There are many different ways to close and cover an incision, including stitches, skin glue, and adhesive strips. Follow your health care provider's instructions on: ? Incision care. ? Bandage (dressing) changes and removal. ? Incision closure removal.  Monitor your dialysis fistula carefully. Contact a health care provider if:  You have drainage, redness, swelling, or pain at your catheter site.  You have a fever.  You have chills. Get help right away if:  You feel weak.  You have trouble balancing.  You have trouble moving your arms or legs.  You have problems with your speech or vision.  You can no longer feel a vibration or buzz when you put your fingers over your dialysis fistula.  The limb that was used for the procedure: ? Swells. ? Is painful. ? Is cold. ? Is discolored, such as blue or pale white. This  information is not intended to replace advice given to you by your health care provider. Make sure you discuss any questions you have with your health care provider. Document Released: 05/03/2014 Document Revised: 05/24/2016 Document Reviewed: 02/05/2014 Elsevier Interactive Patient Education  2018 [employer].

## 2024-04-21 NOTE — Op Note (Signed)
 OPERATIVE NOTE   PROCEDURE: Contrast injection left arm AV access Percutaneous transluminal angioplasty and stent placement peripheral segment  PRE-OPERATIVE DIAGNOSIS: Complication of dialysis access                                                       End Stage Renal Disease  POST-OPERATIVE DIAGNOSIS: same as above   SURGEON: Jackquelyn Mass, M.D.  ANESTHESIA: Conscious sedation was administered under my direct supervision by the interventional radiology RN. IV Versed  plus fentanyl  were utilized. Continuous ECG, pulse oximetry and blood pressure was monitored throughout the entire procedure.  Conscious sedation was for a total of 27 minutes and 41 seconds.  ESTIMATED BLOOD LOSS: minimal  FINDING(S): 75 to 80% stenosis in the midportion of the AV graft in the area that they have been cannulating  SPECIMEN(S):  None  CONTRAST: 15 cc  FLUOROSCOPY TIME: 1.8 minutes  INDICATIONS: Janet Olson is a 54 y.o. female who  presents with malfunctioning left arm AV access.  The patient is scheduled for angiography with possible intervention of the AV access.  The patient is aware the risks include but are not limited to: bleeding, infection, thrombosis of the cannulated access, and possible anaphylactic reaction to the contrast.  The patient acknowledges if the access can not be salvaged a tunneled catheter will be needed and will be placed during this procedure.  The patient is aware of the risks of the procedure and elects to proceed with the angiogram and intervention.  DESCRIPTION: After full informed written consent was obtained, the patient was brought back to the Special Procedure suite and placed supine position.  Appropriate cardiopulmonary monitors were placed.  The left arm was prepped and draped in the standard fashion.  Appropriate timeout is called. The left AV access was cannulated with a micropuncture needle.  Cannulation was performed with ultrasound guidance. Ultrasound  was placed in a sterile sleeve, the AV access was interrogated and noted to be echolucent and compressible indicating patency. Image was recorded for the permanent record. The puncture is performed under continuous ultrasound visualization.   The microwire was advanced and the needle was exchanged for  a microsheath.  The J-wire was then advanced and a 6 Fr sheath inserted.  Hand injections were completed to image the access from the arterial anastomosis through the entire access.  The central venous structures were also imaged by hand injections.  Based on the images,  4000 units of heparin  was given and a wire was negotiated through the strictures within the venous portion of the graft.  The 6 French sheath was exchanged for a 7 [place] sheath.  An 8 mm x 100 mm Viabahn was deployed across the stenoses and postdilated with an 8 mm by 100 mm [place] balloon inflated to 22 atm for approximately 30 seconds.  Follow-up imaging demonstrates complete resolution of the stricture with rapid flow of contrast through the graft, the central venous anatomy is preserved.  A 4-0 Monocryl purse-string suture was sewn around the sheath.  The sheath was removed and light pressure was applied.  A sterile bandage was applied to the puncture site.    COMPLICATIONS: None  CONDITION: Janet Olson, M.D [facility] Office: [phone]  04/21/2024 2:48 PM

## 2024-04-22 ENCOUNTER — Encounter: Payer: Self-pay | Admitting: Vascular Surgery

## 2024-04-22 ENCOUNTER — Other Ambulatory Visit
Admission: RE | Admit: 2024-04-22 | Discharge: 2024-04-22 | Disposition: A | Discharge status: Home / Self Care | Source: Ambulatory Visit | Attending: Nephrology | Admitting: Nephrology

## 2024-04-22 DIAGNOSIS — N186 End stage renal disease: Secondary | ICD-10-CM | POA: Insufficient documentation

## 2024-04-22 LAB — CALCIUM: Calcium: 8.7 mg/dL — ABNORMAL LOW (ref 8.9–10.3)

## 2024-04-22 LAB — POTASSIUM: Potassium: 4.4 mmol/L (ref 3.5–5.1)

## 2024-04-22 LAB — BUN: BUN: 72 mg/dL — ABNORMAL HIGH (ref 6–20)

## 2024-05-11 ENCOUNTER — Other Ambulatory Visit: Payer: Self-pay

## 2024-05-11 DIAGNOSIS — I483 Typical atrial flutter: Secondary | ICD-10-CM

## 2024-05-11 MED ORDER — APIXABAN 5 MG PO TABS: 5.0000 mg | ORAL_TABLET | Freq: Two times a day (BID) | ORAL | 11 refills | Status: AC

## 2024-05-11 NOTE — Telephone Encounter (Signed)
 Prescription refill request for Eliquis  received. Indication:afib Last office visit:2/25 Scr:9.94  1/25 Age: 54 Weight:94.3  kg  Prescription refilled

## 2024-05-13 ENCOUNTER — Other Ambulatory Visit (INDEPENDENT_AMBULATORY_CARE_PROVIDER_SITE_OTHER): Payer: Self-pay | Admitting: Vascular Surgery

## 2024-05-13 DIAGNOSIS — N186 End stage renal disease: Secondary | ICD-10-CM

## 2024-05-14 ENCOUNTER — Ambulatory Visit (INDEPENDENT_AMBULATORY_CARE_PROVIDER_SITE_OTHER): Admitting: Vascular Surgery

## 2024-05-14 ENCOUNTER — Encounter (INDEPENDENT_AMBULATORY_CARE_PROVIDER_SITE_OTHER)

## 2024-05-19 ENCOUNTER — Encounter (INDEPENDENT_AMBULATORY_CARE_PROVIDER_SITE_OTHER): Payer: Self-pay

## 2024-05-26 ENCOUNTER — Telehealth: Payer: Self-pay | Admitting: Cardiovascular Disease

## 2024-05-26 NOTE — Telephone Encounter (Addendum)
*  STAT* If patient is at the pharmacy, call can be transferred to refill team.   1. Which medications need to be refilled? (please list name of each medication and dose if known) metoprolol  tartrate (LOPRESSOR ) 50 MG tablet    2. Would you like to learn more about the convenience, safety, & potential cost savings by using the [facility]?    3. Are you open to using the [facility] (Type [facility].  ).   4. Which pharmacy/location (including street and city if local pharmacy) is medication to be sent to? [id number] - [place], [place] - [address] AT [facility] OF [address]    5. Do they need a 30 day or 90 day supply? 90 day  Patient has no meds left.

## 2024-05-27 MED ORDER — METOPROLOL TARTRATE 50 MG PO TABS: 50.0000 mg | ORAL_TABLET | Freq: Two times a day (BID) | ORAL | 3 refills | Status: AC

## 2024-07-14 ENCOUNTER — Other Ambulatory Visit: Payer: Self-pay | Admitting: Family Medicine

## 2024-07-14 DIAGNOSIS — Z1231 Encounter for screening mammogram for malignant neoplasm of breast: Secondary | ICD-10-CM

## 2024-07-24 ENCOUNTER — Encounter

## 2024-08-05 ENCOUNTER — Ambulatory Visit
Admission: RE | Admit: 2024-08-05 | Discharge: 2024-08-05 | Disposition: A | Discharge status: Home / Self Care | Source: Ambulatory Visit | Attending: Family Medicine | Admitting: Family Medicine

## 2024-08-05 DIAGNOSIS — Z1231 Encounter for screening mammogram for malignant neoplasm of breast: Secondary | ICD-10-CM | POA: Insufficient documentation

## 2024-08-07 ENCOUNTER — Encounter: Payer: Self-pay | Admitting: Family Medicine

## 2024-08-14 ENCOUNTER — Encounter: Payer: Self-pay | Admitting: Family Medicine

## 2024-08-14 ENCOUNTER — Other Ambulatory Visit: Payer: Self-pay | Admitting: Family Medicine

## 2024-08-14 DIAGNOSIS — R928 Other abnormal and inconclusive findings on diagnostic imaging of breast: Secondary | ICD-10-CM

## 2024-08-14 DIAGNOSIS — R921 Mammographic calcification found on diagnostic imaging of breast: Secondary | ICD-10-CM

## 2024-08-17 ENCOUNTER — Encounter: Payer: Self-pay | Admitting: Family Medicine

## 2024-08-18 ENCOUNTER — Ambulatory Visit
Admission: RE | Admit: 2024-08-18 | Discharge: 2024-08-18 | Disposition: A | Discharge status: Home / Self Care | Source: Ambulatory Visit | Attending: Family Medicine | Admitting: Family Medicine

## 2024-08-18 DIAGNOSIS — R928 Other abnormal and inconclusive findings on diagnostic imaging of breast: Secondary | ICD-10-CM | POA: Diagnosis present

## 2024-08-18 DIAGNOSIS — R921 Mammographic calcification found on diagnostic imaging of breast: Secondary | ICD-10-CM | POA: Insufficient documentation

## 2024-09-06 ENCOUNTER — Emergency Department
Admission: EM | Admit: 2024-09-06 | Discharge: 2024-09-06 | Disposition: A | Discharge status: Home / Self Care | Attending: Emergency Medicine | Admitting: Emergency Medicine

## 2024-09-06 ENCOUNTER — Other Ambulatory Visit: Payer: Self-pay

## 2024-09-06 ENCOUNTER — Emergency Department

## 2024-09-06 DIAGNOSIS — Z992 Dependence on renal dialysis: Secondary | ICD-10-CM | POA: Diagnosis not present

## 2024-09-06 DIAGNOSIS — R0789 Other chest pain: Secondary | ICD-10-CM | POA: Diagnosis present

## 2024-09-06 DIAGNOSIS — J449 Chronic obstructive pulmonary disease, unspecified: Secondary | ICD-10-CM | POA: Diagnosis not present

## 2024-09-06 DIAGNOSIS — I132 Hypertensive heart and chronic kidney disease with heart failure and with stage 5 chronic kidney disease, or end stage renal disease: Secondary | ICD-10-CM | POA: Insufficient documentation

## 2024-09-06 DIAGNOSIS — I5030 Unspecified diastolic (congestive) heart failure: Secondary | ICD-10-CM | POA: Insufficient documentation

## 2024-09-06 DIAGNOSIS — R918 Other nonspecific abnormal finding of lung field: Secondary | ICD-10-CM | POA: Insufficient documentation

## 2024-09-06 DIAGNOSIS — Z7901 Long term (current) use of anticoagulants: Secondary | ICD-10-CM | POA: Diagnosis not present

## 2024-09-06 DIAGNOSIS — N186 End stage renal disease: Secondary | ICD-10-CM | POA: Diagnosis not present

## 2024-09-06 DIAGNOSIS — I4891 Unspecified atrial fibrillation: Secondary | ICD-10-CM | POA: Insufficient documentation

## 2024-09-06 LAB — BASIC METABOLIC PANEL WITH GFR
Anion gap: 17 — ABNORMAL HIGH (ref 5–15)
BUN: 68 mg/dL — ABNORMAL HIGH (ref 6–20)
CO2: 26 mmol/L (ref 22–32)
Calcium: 8.7 mg/dL — ABNORMAL LOW (ref 8.9–10.3)
Chloride: 99 mmol/L (ref 98–111)
Creatinine, Ser: 12.27 mg/dL — ABNORMAL HIGH (ref 0.44–1.00)
GFR, Estimated: 3 mL/min — ABNORMAL LOW (ref >60)
Glucose, Bld: 109 mg/dL — ABNORMAL HIGH (ref 70–99)
Potassium: 4.2 mmol/L (ref 3.5–5.1)
Sodium: 142 mmol/L (ref 135–145)

## 2024-09-06 LAB — HEPATIC FUNCTION PANEL
ALT: 16 U/L (ref 0–44)
AST: 20 U/L (ref 15–41)
Albumin: 3.6 g/dL (ref 3.5–5.0)
Alkaline Phosphatase: 191 U/L — ABNORMAL HIGH (ref 38–126)
Bilirubin, Direct: <0.1 mg/dL (ref 0.0–0.2)
Total Bilirubin: 0.7 mg/dL (ref 0.0–1.2)
Total Protein: 7.6 g/dL (ref 6.5–8.1)

## 2024-09-06 LAB — CBC
HCT: 40.5 % (ref 36.0–46.0)
Hemoglobin: 13.1 g/dL (ref 12.0–15.0)
MCH: 29.2 pg (ref 26.0–34.0)
MCHC: 32.3 g/dL (ref 30.0–36.0)
MCV: 90.4 fL (ref 80.0–100.0)
Platelets: 157 K/uL (ref 150–400)
RBC: 4.48 MIL/uL (ref 3.87–5.11)
RDW: 16.4 % — ABNORMAL HIGH (ref 11.5–15.5)
WBC: 4.7 K/uL (ref 4.0–10.5)
nRBC: 0 % (ref 0.0–0.2)

## 2024-09-06 LAB — TROPONIN I (HIGH SENSITIVITY)
Troponin I (High Sensitivity): 14 ng/L (ref <18)
Troponin I (High Sensitivity): 14 ng/L (ref <18)

## 2024-09-06 LAB — LIPASE, BLOOD: Lipase: 70 U/L — ABNORMAL HIGH (ref 11–51)

## 2024-09-06 MED ORDER — IOHEXOL 350 MG/ML SOLN
100.0000 mL | Freq: Once | INTRAVENOUS | Status: AC | PRN
  Administered 2024-09-06: 100 mL via INTRAVENOUS

## 2024-09-06 MED ORDER — LIDOCAINE 5 % EX PTCH
1.0000 | MEDICATED_PATCH | CUTANEOUS | Status: DC
  Administered 2024-09-06: 1 via TRANSDERMAL
  Filled 2024-09-06: qty 1

## 2024-09-06 MED ORDER — MORPHINE SULFATE (PF) 4 MG/ML IV SOLN
4.0000 mg | Freq: Once | INTRAVENOUS | Status: AC
  Administered 2024-09-06: 4 mg via INTRAVENOUS
  Filled 2024-09-06: qty 1

## 2024-09-06 NOTE — ED Provider Notes (Signed)
 [facility] Provider Note    Event Date/Time   First MD Initiated Contact with Patient 09/06/24 1714     (approximate)   History   Chief Complaint Flank Pain   HPI  Janet Olson is a 54 y.o. female with past medical history of hypertension, atrial fibrillation on Eliquis , HFpEF, COPD, and ESRD on HD (MWF) who presents to the ED complaining of flank pain.  Patient reports that she has had 4 days of intermittent pain in the left side of her chest that wraps around to the side and upper back.  She states the pain is associated with some difficulty breathing and seems to be worse when she lays on her side.  The pain will also wrap around into her upper abdomen but she denies any nausea, vomiting, diarrhea, or dysuria.  She has not had any fevers and denies any cough, has not noticed any pain or swelling in her legs.  She has completed her dialysis treatments without difficulty, has a fistula in her left upper extremity.     Physical Exam   Triage Vital Signs: ED Triage Vitals  Encounter Vitals Group     BP 09/06/24 1706 118/68     Girls Systolic BP Percentile --      Girls Diastolic BP Percentile --      Boys Systolic BP Percentile --      Boys Diastolic BP Percentile --      Pulse Rate 09/06/24 1706 88     Resp 09/06/24 1706 (!) 25     Temp 09/06/24 1716 (!) 97.5 F (36.4 C)     Temp Source 09/06/24 1716 Oral     SpO2 09/06/24 1706 90 %     Weight 09/06/24 1707 200 lb (90.7 kg)     Height 09/06/24 1707 5' 6 (1.676 m)     Head Circumference --      Peak Flow --      Pain Score 09/06/24 1707 10     Pain Loc --      Pain Education --      Exclude from Growth Chart --     Most recent vital signs: Vitals:   09/06/24 1915 09/06/24 1930  BP:  (!) 133/102  Pulse: 88 85  Resp:  14  Temp:    SpO2: 95% 92%    Constitutional: Alert and oriented. Eyes: Conjunctivae are normal. Head: Atraumatic. Nose: No congestion/rhinnorhea. Mouth/Throat:  Mucous membranes are moist.  Cardiovascular: Normal rate, regular rhythm. Grossly normal heart sounds.  2+ radial pulses bilaterally.  Left upper extremity AV fistula with palpable thrill. Respiratory: Normal respiratory effort.  No retractions. Lungs CTAB.  Left-sided chest wall tenderness to palpation with no overlying rash. Gastrointestinal: Soft and tender to palpation in the epigastrium and left upper quadrant with no rebound or guarding.  No CVA tenderness bilaterally.  No distention. Musculoskeletal: No lower extremity tenderness nor edema.  Neurologic:  Normal speech and language. No gross focal neurologic deficits are appreciated.    ED Results / Procedures / Treatments   Labs (all labs ordered are listed, but only abnormal results are displayed) Labs Reviewed  BASIC METABOLIC PANEL WITH GFR - Abnormal; Notable for the following components:      Result Value   Glucose, Bld 109 (*)    BUN 68 (*)    Creatinine, Ser 12.27 (*)    Calcium  8.7 (*)    GFR, Estimated 3 (*)    Anion gap 17 (*)  All other components within normal limits  CBC - Abnormal; Notable for the following components:   RDW 16.4 (*)    All other components within normal limits  HEPATIC FUNCTION PANEL - Abnormal; Notable for the following components:   Alkaline Phosphatase 191 (*)    All other components within normal limits  LIPASE, BLOOD - Abnormal; Notable for the following components:   Lipase 70 (*)    All other components within normal limits  TROPONIN I (HIGH SENSITIVITY)  TROPONIN I (HIGH SENSITIVITY)     EKG  ED ECG REPORT I, Carlin Palin, the attending physician, personally viewed and interpreted this ECG.   Date: 09/06/2024  EKG Time: 8:41  Rate: 86  Rhythm: normal sinus rhythm  Axis: Normal  Intervals:Prolonged QT  ST&T Change: Anterolateral T wave inversions, similar to previous  RADIOLOGY Chest x-ray reviewed and interpreted by me with mild pulmonary edema, no focal infiltrate  noted.  PROCEDURES:  Critical Care performed: No  Procedures   MEDICATIONS ORDERED IN ED: Medications  lidocaine  (LIDODERM ) 5 % 1 patch (1 patch Transdermal Patch Applied 09/06/24 1828)  morphine  (PF) 4 MG/ML injection 4 mg (4 mg Intravenous Given 09/06/24 1827)  iohexol  (OMNIPAQUE ) 350 MG/ML injection 100 mL (100 mLs Intravenous Contrast Given 09/06/24 1852)     IMPRESSION / MDM / ASSESSMENT AND PLAN / ED COURSE  I reviewed the triage vital signs and the nursing notes.                              54 y.o. female with past medical history of hypertension, atrial fibrillation on Eliquis , HFpEF, COPD, and ESRD on HD who presents to the ED complaining of pain wrapping around her left flank, left chest wall, and upper abdomen over the past 4 days, acutely worse today.  Patient's presentation is most consistent with acute presentation with potential threat to life or bodily function.  Differential diagnosis includes, but is not limited to, ACS, PE, pneumonia, pneumothorax, musculoskeletal pain, GERD, pancreatitis, hepatitis, cholecystitis, biliary colic, kidney stone.  Patient nontoxic-appearing and in no acute distress, vital signs are unremarkable.  Pain is reproducible with palpation of her left lateral chest wall as well as her epigastrium and left upper quadrant.  EKG with T wave inversions similar to previous and symptoms seem atypical for ACS.  Chest x-ray with some mild pulmonary edema but patient is not in any respiratory distress and maintaining oxygen saturations at 90% on her usual 4 L.  Labs without significant anemia, leukocytosis, or electrolyte abnormality.  Troponin within normal limits and will check second set.  Plan to further assess with CTA chest as well as CT imaging of her abdomen/pelvis.  We will treat symptomatically with Lidoderm  patch and IV morphine .  CTA chest is negative for PE or other acute finding, does note pulmonary nodules that patient was informed of.  CT of  abdomen/pelvis shows cholelithiasis without evidence of cholecystitis, symptoms seem unlikely to represent biliary colic.  LFTs are unremarkable, lipase mildly elevated but low suspicion for pancreatitis given no findings consistent with this on CT imaging.  Patient feeling better on reassessment and 2 sets of troponin are negative, doubt ACS.  She is appropriate for discharge home with outpatient follow-up, was counseled to return to the ED for new or worsening symptoms.  Patient agrees with plan.      FINAL CLINICAL IMPRESSION(S) / ED DIAGNOSES   Final diagnoses:  Chest wall pain  Pulmonary nodules     Rx / DC Orders   ED Discharge Orders     None        Note:  This document was prepared using Dragon voice recognition software and may include unintentional dictation errors.   Willo Dunnings, MD 09/06/24 2100

## 2024-09-06 NOTE — ED Notes (Signed)
 No labs done in triage/pt taken to xray before this RN could get labs & IV.

## 2024-09-06 NOTE — ED Notes (Signed)
 Pt to ED for SOB, L side pain, coughing, lower back pain, HA, L arm pain since about 3 days. SOB worse with supine position. Last dialysis Friday, gets M [facility]. Grandkids were sick 2 weeks ago, coughing.

## 2024-09-06 NOTE — ED Triage Notes (Signed)
 Pt comes in via pov with complaints of chest pain that started about 4 days ago. Pt states that the pain has progressively gotten worse. Pt complains of pain 10/10. Pt states that she feels pain in her left shoulder, and pain in her left lower back. Pt is on oxygen prn 4L . Pt requesting oxygen on triage. Pt with o2 level of 88% on RA pt moved up to 4L by this RN.

## 2025-02-02 ENCOUNTER — Other Ambulatory Visit: Payer: Self-pay

## 2025-02-04 ENCOUNTER — Other Ambulatory Visit: Payer: Self-pay | Admitting: Family Medicine

## 2025-02-04 DIAGNOSIS — R921 Mammographic calcification found on diagnostic imaging of breast: Secondary | ICD-10-CM

## 2025-02-05 ENCOUNTER — Emergency Department: Admission: EM | Admit: 2025-02-05 | Source: Home / Self Care

## 2025-02-05 ENCOUNTER — Other Ambulatory Visit: Payer: Self-pay

## 2025-02-05 LAB — COMPREHENSIVE METABOLIC PANEL WITH GFR
ALT: 21 U/L (ref 0–44)
AST: 23 U/L (ref 15–41)
Albumin: 4.2 g/dL (ref 3.5–5.0)
Alkaline Phosphatase: 170 U/L — ABNORMAL HIGH (ref 38–126)
Anion gap: 13 (ref 5–15)
BUN: 32 mg/dL — ABNORMAL HIGH (ref 6–20)
CO2: 30 mmol/L (ref 22–32)
Calcium: 6.8 mg/dL — ABNORMAL LOW (ref 8.9–10.3)
Chloride: 99 mmol/L (ref 98–111)
Creatinine, Ser: 6.61 mg/dL — ABNORMAL HIGH (ref 0.44–1.00)
GFR, Estimated: 7 mL/min — ABNORMAL LOW
Glucose, Bld: 100 mg/dL — ABNORMAL HIGH (ref 70–99)
Potassium: 4.7 mmol/L (ref 3.5–5.1)
Sodium: 143 mmol/L (ref 135–145)
Total Bilirubin: 0.3 mg/dL (ref 0.0–1.2)
Total Protein: 7.1 g/dL (ref 6.5–8.1)

## 2025-02-05 LAB — CBC
HCT: 34.5 % — ABNORMAL LOW (ref 36.0–46.0)
Hemoglobin: 10.9 g/dL — ABNORMAL LOW (ref 12.0–15.0)
MCH: 29.5 pg (ref 26.0–34.0)
MCHC: 31.6 g/dL (ref 30.0–36.0)
MCV: 93.2 fL (ref 80.0–100.0)
Platelets: 153 10*3/uL (ref 150–400)
RBC: 3.7 MIL/uL — ABNORMAL LOW (ref 3.87–5.11)
RDW: 16.2 % — ABNORMAL HIGH (ref 11.5–15.5)
WBC: 7.7 10*3/uL (ref 4.0–10.5)
nRBC: 0 % (ref 0.0–0.2)

## 2025-02-05 LAB — LIPASE, BLOOD: Lipase: 105 U/L — ABNORMAL HIGH (ref 11–51)

## 2025-02-05 NOTE — ED Triage Notes (Signed)
 Pt to ed from home via POV for flank pain bilaterally x 2 weeks. Pt is caox4, in no acute distress and ambulatory to triage.

## 2025-02-05 NOTE — ED Notes (Signed)
 BLUE/RED top sent down with labs.

## 2025-02-15 ENCOUNTER — Encounter
